# Patient Record
Sex: Male | Born: 1945
Health system: Southern US, Community
[De-identification: ages and names within clinical notes are randomized; demographics above are authoritative.]

## PROBLEM LIST (undated history)

## (undated) DIAGNOSIS — M25569 Pain in unspecified knee: Secondary | ICD-10-CM

## (undated) DIAGNOSIS — M25529 Pain in unspecified elbow: Secondary | ICD-10-CM

## (undated) DIAGNOSIS — E119 Type 2 diabetes mellitus without complications: Secondary | ICD-10-CM

## (undated) DIAGNOSIS — I10 Essential (primary) hypertension: Secondary | ICD-10-CM

## (undated) DIAGNOSIS — Z8249 Family history of ischemic heart disease and other diseases of the circulatory system: Secondary | ICD-10-CM

## (undated) DIAGNOSIS — C801 Malignant (primary) neoplasm, unspecified: Secondary | ICD-10-CM

## (undated) HISTORY — PX: JOINT REPLACEMENT: SHX530

## (undated) HISTORY — DX: Family history of ischemic heart disease and other diseases of the circulatory system: Z82.49

## (undated) HISTORY — PX: REPLACEMENT TOTAL HIP W/  RESURFACING IMPLANTS: SUR1222

## (undated) HISTORY — PX: SHOULDER SURGERY: SHX246

## (undated) HISTORY — DX: Essential (primary) hypertension: I10

## (undated) HISTORY — DX: Pain in unspecified elbow: M25.529

## (undated) HISTORY — DX: Type 2 diabetes mellitus without complications: E11.9

## (undated) HISTORY — DX: Pain in unspecified knee: M25.569

## (undated) HISTORY — PX: TOTAL KNEE ARTHROPLASTY: SHX125

## (undated) HISTORY — DX: Malignant (primary) neoplasm, unspecified: C80.1

## (undated) MED FILL — Dexamethasone Sodium Phosphate Inj 100 MG/10ML: INTRAMUSCULAR | Qty: 1 | Status: AC

---

## 2009-06-18 DIAGNOSIS — M25529 Pain in unspecified elbow: Secondary | ICD-10-CM

## 2009-06-18 DIAGNOSIS — R7301 Impaired fasting glucose: Secondary | ICD-10-CM | POA: Insufficient documentation

## 2009-06-18 HISTORY — DX: Pain in unspecified elbow: M25.529

## 2012-04-02 DIAGNOSIS — M25569 Pain in unspecified knee: Secondary | ICD-10-CM

## 2012-04-02 HISTORY — DX: Pain in unspecified knee: M25.569

## 2013-01-14 DIAGNOSIS — I4891 Unspecified atrial fibrillation: Secondary | ICD-10-CM | POA: Insufficient documentation

## 2013-01-14 DIAGNOSIS — Z7901 Long term (current) use of anticoagulants: Secondary | ICD-10-CM | POA: Insufficient documentation

## 2013-05-11 ENCOUNTER — Ambulatory Visit: Payer: Self-pay | Admitting: Physician Assistant

## 2013-05-29 HISTORY — PX: OTHER SURGICAL HISTORY: SHX169

## 2013-09-14 ENCOUNTER — Ambulatory Visit: Payer: Self-pay | Admitting: Emergency Medicine

## 2013-09-14 LAB — URINALYSIS, COMPLETE
Bilirubin,UR: NEGATIVE
Glucose,UR: NEGATIVE mg/dL (ref 0–75)
Ketone: NEGATIVE
NITRITE: NEGATIVE
PH: 8.5 (ref 4.5–8.0)
Protein: 100
Specific Gravity: 1.01 (ref 1.003–1.030)
Squamous Epithelial: NONE SEEN

## 2013-09-16 DIAGNOSIS — N390 Urinary tract infection, site not specified: Secondary | ICD-10-CM | POA: Insufficient documentation

## 2013-09-16 DIAGNOSIS — G8929 Other chronic pain: Secondary | ICD-10-CM | POA: Insufficient documentation

## 2013-09-16 DIAGNOSIS — M549 Dorsalgia, unspecified: Secondary | ICD-10-CM | POA: Insufficient documentation

## 2013-10-23 DIAGNOSIS — C678 Malignant neoplasm of overlapping sites of bladder: Secondary | ICD-10-CM | POA: Insufficient documentation

## 2013-10-23 DIAGNOSIS — C679 Malignant neoplasm of bladder, unspecified: Secondary | ICD-10-CM | POA: Insufficient documentation

## 2014-07-30 DIAGNOSIS — M6240 Contracture of muscle, unspecified site: Secondary | ICD-10-CM | POA: Insufficient documentation

## 2014-07-30 DIAGNOSIS — M62838 Other muscle spasm: Secondary | ICD-10-CM | POA: Insufficient documentation

## 2014-12-31 DIAGNOSIS — E7800 Pure hypercholesterolemia, unspecified: Secondary | ICD-10-CM | POA: Insufficient documentation

## 2014-12-31 DIAGNOSIS — I10 Essential (primary) hypertension: Secondary | ICD-10-CM | POA: Insufficient documentation

## 2015-05-18 ENCOUNTER — Encounter: Payer: Self-pay | Admitting: Pain Medicine

## 2015-05-18 ENCOUNTER — Ambulatory Visit: Payer: Medicare Other | Attending: Pain Medicine | Admitting: Pain Medicine

## 2015-05-18 VITALS — BP 137/53 | HR 53 | Temp 98.0°F | Resp 18 | Ht 68.0 in | Wt 216.0 lb

## 2015-05-18 DIAGNOSIS — M25512 Pain in left shoulder: Secondary | ICD-10-CM

## 2015-05-18 DIAGNOSIS — M25569 Pain in unspecified knee: Secondary | ICD-10-CM | POA: Insufficient documentation

## 2015-05-18 DIAGNOSIS — M25519 Pain in unspecified shoulder: Secondary | ICD-10-CM | POA: Insufficient documentation

## 2015-05-18 DIAGNOSIS — Z9229 Personal history of other drug therapy: Secondary | ICD-10-CM

## 2015-05-18 DIAGNOSIS — M1120 Other chondrocalcinosis, unspecified site: Secondary | ICD-10-CM | POA: Insufficient documentation

## 2015-05-18 DIAGNOSIS — I4891 Unspecified atrial fibrillation: Secondary | ICD-10-CM | POA: Insufficient documentation

## 2015-05-18 DIAGNOSIS — F1190 Opioid use, unspecified, uncomplicated: Secondary | ICD-10-CM | POA: Diagnosis not present

## 2015-05-18 DIAGNOSIS — G8929 Other chronic pain: Secondary | ICD-10-CM | POA: Insufficient documentation

## 2015-05-18 DIAGNOSIS — Z5181 Encounter for therapeutic drug level monitoring: Secondary | ICD-10-CM

## 2015-05-18 DIAGNOSIS — M542 Cervicalgia: Secondary | ICD-10-CM | POA: Diagnosis not present

## 2015-05-18 DIAGNOSIS — E669 Obesity, unspecified: Secondary | ICD-10-CM | POA: Insufficient documentation

## 2015-05-18 DIAGNOSIS — Z8551 Personal history of malignant neoplasm of bladder: Secondary | ICD-10-CM

## 2015-05-18 DIAGNOSIS — M791 Myalgia: Secondary | ICD-10-CM

## 2015-05-18 DIAGNOSIS — Z96653 Presence of artificial knee joint, bilateral: Secondary | ICD-10-CM | POA: Diagnosis not present

## 2015-05-18 DIAGNOSIS — M25511 Pain in right shoulder: Secondary | ICD-10-CM

## 2015-05-18 DIAGNOSIS — M62838 Other muscle spasm: Secondary | ICD-10-CM

## 2015-05-18 DIAGNOSIS — Z8781 Personal history of (healed) traumatic fracture: Secondary | ICD-10-CM

## 2015-05-18 DIAGNOSIS — M545 Low back pain: Secondary | ICD-10-CM | POA: Insufficient documentation

## 2015-05-18 DIAGNOSIS — Z9889 Other specified postprocedural states: Secondary | ICD-10-CM | POA: Insufficient documentation

## 2015-05-18 DIAGNOSIS — F17210 Nicotine dependence, cigarettes, uncomplicated: Secondary | ICD-10-CM | POA: Diagnosis not present

## 2015-05-18 DIAGNOSIS — Z79899 Other long term (current) drug therapy: Secondary | ICD-10-CM

## 2015-05-18 DIAGNOSIS — M25562 Pain in left knee: Secondary | ICD-10-CM

## 2015-05-18 DIAGNOSIS — E119 Type 2 diabetes mellitus without complications: Secondary | ICD-10-CM | POA: Insufficient documentation

## 2015-05-18 DIAGNOSIS — E7800 Pure hypercholesterolemia, unspecified: Secondary | ICD-10-CM | POA: Diagnosis not present

## 2015-05-18 DIAGNOSIS — Z7901 Long term (current) use of anticoagulants: Secondary | ICD-10-CM | POA: Diagnosis not present

## 2015-05-18 DIAGNOSIS — F1120 Opioid dependence, uncomplicated: Secondary | ICD-10-CM | POA: Insufficient documentation

## 2015-05-18 DIAGNOSIS — M549 Dorsalgia, unspecified: Secondary | ICD-10-CM | POA: Insufficient documentation

## 2015-05-18 DIAGNOSIS — Z79891 Long term (current) use of opiate analgesic: Secondary | ICD-10-CM | POA: Insufficient documentation

## 2015-05-18 DIAGNOSIS — I10 Essential (primary) hypertension: Secondary | ICD-10-CM | POA: Insufficient documentation

## 2015-05-18 DIAGNOSIS — M7918 Myalgia, other site: Secondary | ICD-10-CM | POA: Insufficient documentation

## 2015-05-18 DIAGNOSIS — M25559 Pain in unspecified hip: Secondary | ICD-10-CM | POA: Diagnosis not present

## 2015-05-18 DIAGNOSIS — Z96643 Presence of artificial hip joint, bilateral: Secondary | ICD-10-CM | POA: Diagnosis not present

## 2015-05-18 DIAGNOSIS — G893 Neoplasm related pain (acute) (chronic): Secondary | ICD-10-CM | POA: Insufficient documentation

## 2015-05-18 DIAGNOSIS — Z7189 Other specified counseling: Secondary | ICD-10-CM

## 2015-05-18 DIAGNOSIS — M25561 Pain in right knee: Secondary | ICD-10-CM

## 2015-05-18 DIAGNOSIS — M5412 Radiculopathy, cervical region: Secondary | ICD-10-CM

## 2015-05-18 MED ORDER — MORPHINE SULFATE ER 30 MG PO TBCR: 30.0000 mg | EXTENDED_RELEASE_TABLET | Freq: Two times a day (BID) | ORAL | Status: DC

## 2015-05-18 MED ORDER — OXYCODONE-ACETAMINOPHEN 10-325 MG PO TABS: 1.0000 | ORAL_TABLET | Freq: Three times a day (TID) | ORAL | Status: DC | PRN

## 2015-05-18 NOTE — Patient Instructions (Signed)
Labwork to be drawn in [facility] as discussed.

## 2015-05-18 NOTE — Progress Notes (Signed)
Safety precautions to be maintained throughout the outpatient stay will include: orient to surroundings, keep bed in low position, maintain call bell within reach at all times, provide assistance with transfer out of bed and ambulation.  Percocet  10/325 mg 27 tabs Morphine sulfate ER 30mg  tabs 30 tabs

## 2015-05-18 NOTE — Progress Notes (Signed)
Patient's Name: Aaron Short MRN: [id number] DOB: 11/25/1945 DOS: 05/18/2015  Primary Reason(s) for Visit: Encounter for Medication Management CC: Shoulder Pain; Hip Pain; Knee Pain; and Back Pain   HPI:    [name] is a 69 y.o. year old, male patient, who returns today as an established patient. He has Chronic pain; Long term current use of opiate analgesic; Long term prescription opiate use; Opiate use; Encounter for therapeutic drug level monitoring; Encounter for chronic pain management; Opioid dependence, daily use ([place]); Chronic hip pain (Bilateral) (Location of Primary Source of Pain) (R>L); Chronic shoulder pain (Bilateral) (L>R); Chronic low back pain (Bilateral) (R>L); Chronic knee pain (Bilateral) (R>L); Status post bilateral total hip replacement; Status post total bilateral knee replacement; Adiposity; Atrial fibrillation ([place]); Malignant neoplasm of urinary bladder ([place]); Type 2 diabetes mellitus ([place]); Essential (primary) hypertension; Muscle spasticity; Long term current use of anticoagulant; Combined fat and carbohydrate induced hyperlipemia; Arthralgia of lower leg; Arthralgia of upper arm; Pure hypercholesterolemia; History of bladder cancer; History of hip fracture; History of pelvic fracture; History of anticoagulant therapy; Chronic neck pain; Chondrocalcinosis; Radicular pain of shoulder; History of shoulder surgery; Myofascial pain; Musculoskeletal pain; and Muscle spasm on his problem list.. His primarily concern today is the Shoulder Pain; Hip Pain; Knee Pain; and Back Pain     The [facility] was [facility] seen at [facility] on [facility]2015 for medical management of his chronic pain. He returns today for follow-up.  Today's Pain Score: 2  Reported level of pain is incompatible with clinical obrservations. This may be secondary to a possible lack of understanding on how the pain scale works. Pain Type: Chronic pain Pain Location: Hip Pain Orientation: Right Pain Descriptors /  Indicators: Aching, Constant Pain Frequency: Constant  Last appointment: 01/19/2015 Purpose: Pharmacological management of the pain  Pharmacotherapy Review:   Side-effects or Adverse reactions: None reported Effectiveness: Described as relatively effective, allowing for increase in activities of daily living (ADL) Onset of action: Within expected pharmacological parameters Duration of action: Within normal limits for medication Peak effect: Timing and results are as within normal expected parameters [place] PMP: Compliant with practice rules and regulations UDS Results:   none available in the system. UDS Interpretation: No UDS available, at this time Medication Assessment Form: Reviewed. Patient indicates being compliant with therapy Treatment compliance: Compliant Substance Use Disorder (SUD) Risk Level: Low Pharmacologic Plan: Continue therapy as is  Lab Work: Inflammation Markers No results found for: ESRSEDRATE, CRP  Renal Function No results found for: BUN, CREATININE, GFRAA, GFRNONAA  Hepatic Function No results found for: AST, ALT, ALBUMIN  Electrolytes No results found for: NA, K, CL, CALCIUM, MG  Illicit Drugs No results found for: THCU, COCAINSCRNUR, PCPSCRNUR, MDMA, AMPHETMU, METHADONE, ETOH   Allergies:  [name] is allergic to penicillins.  Meds:  The patient has a current medication list which includes the following prescription(s): carisoprodol, digoxin, furosemide, losartan, metformin, metoprolol succinate, morphine, oxycodone-acetaminophen, simvastatin, warfarin, warfarin, morphine, morphine, oxycodone-acetaminophen, and oxycodone-acetaminophen. Requested Prescriptions   Signed Prescriptions Disp Refills  . morphine (MS CONTIN) 30 MG 12 hr tablet 60 tablet 0    Sig: Take 1 tablet (30 mg total) by mouth every 12 (twelve) hours.  Marland Kitchen oxyCODONE-acetaminophen (PERCOCET) 10-325 MG tablet 90 tablet 0    Sig: Take 1 tablet by mouth every 8 (eight) hours as  needed for pain.  Marland Kitchen morphine (MS CONTIN) 30 MG 12 hr tablet 60 tablet 0    Sig: Take 1 tablet (30 mg total) by mouth every 12 (  twelve) hours.  Marland Kitchen morphine (MS CONTIN) 30 MG 12 hr tablet 60 tablet 0    Sig: Take 1 tablet (30 mg total) by mouth every 12 (twelve) hours.  Marland Kitchen oxyCODONE-acetaminophen (PERCOCET) 10-325 MG tablet 90 tablet 0    Sig: Take 1 tablet by mouth every 8 (eight) hours as needed for pain.  Marland Kitchen oxyCODONE-acetaminophen (PERCOCET) 10-325 MG tablet 90 tablet 0    Sig: Take 1 tablet by mouth every 8 (eight) hours as needed for pain.    ROS:  Constitutional: Afebrile, no chills, well hydrated and well nourished Gastrointestinal: negative Musculoskeletal:negative Neurological: negative Behavioral/Psych: negative  PFSH:  Medical:  [name]  has a past medical history of Hypertension; Diabetes mellitus without complication ([place]); and Cancer ([place]). Family: family history includes Dementia in his mother; Heart disease in his father. Surgical:  has past surgical history that includes bladder cancer surgery (2015); Joint replacement; Replacement total hip w/  resurfacing implants (Bilateral); Total knee arthroplasty (Bilateral); and Shoulder surgery (Bilateral). Tobacco:  reports that he has been smoking Cigarettes.  He does not have any smokeless tobacco history on file. Alcohol:  reports that he does not drink alcohol. Drug:  reports that he does not use illicit drugs.  Physical Exam:  Vitals:  Today's Vitals   05/18/15 0932 05/18/15 0945  BP: 137/53   Pulse: 53   Temp: 98 F (36.7 C)   TempSrc: Oral   Resp: 18   Height: 5\' 8"  (1.727 m)   Weight: 216 lb (97.977 kg)   SpO2: 100%   PainSc: 2  2   PainLoc: Hip   Calculated BMI: Body mass index is 32.85 kg/(m^2). General appearance: alert, cooperative, appears stated age and no distress Eyes: PERLA Respiratory: No evidence respiratory distress, no audible rales or ronchi and no use of accessory muscles of  respiration Neck: no adenopathy, no carotid bruit, no JVD, supple, symmetrical, trachea midline and thyroid not enlarged, symmetric, no tenderness/mass/nodules  Cervical Spine ROM: Adequate for flexion, extension, rotation, and lateral bending Palpation: No palpable trigger points  Upper Extremities ROM: Adequate bilaterally Strength: 5/5 for all flexors and extensors of the upper extremity, bilaterally Pulses: Palpable bilaterally Neurologic: No allodynia, No hyperesthesia, No hyperpathia and No sensory abnormalities  Lumbar Spine ROM: Adequate for flexion, extension, rotation, and lateral bending Palpation: No palpable trigger points Lumbar Hyperextension and rotation: Non-contributory Patrick's Maneuver: Non-contributory  Lower Extremities ROM: Adequate bilaterally Strength: 5/5 for all flexors and extensors of the lower extremity, bilaterally Pulses: Palpable bilaterally Neurologic: No allodynia, No hyperesthesia, No hyperpathia, No sensory abnormalities and No antalgic gait or posture  Assessment:  Encounter Diagnosis:  Primary Diagnosis: Chronic pain [G89.29]  Plan:  Interventional Therapies: None at this time.  Aaron Short was seen today for shoulder pain, hip pain, knee pain and back pain.  Diagnoses and all orders for this visit:  Chronic pain -     COMPLETE METABOLIC PANEL WITH GFR; Future -     C-reactive protein; Future -     Magnesium; Future -     Sedimentation rate; Future -     Vitamin D2,D3 Panel; Future -     morphine (MS CONTIN) 30 MG 12 hr tablet; Take 1 tablet (30 mg total) by mouth every 12 (twelve) hours. -     oxyCODONE-acetaminophen (PERCOCET) 10-325 MG tablet; Take 1 tablet by mouth every 8 (eight) hours as needed for pain. -     morphine (MS CONTIN) 30 MG 12 hr tablet; Take 1 tablet (30  mg total) by mouth every 12 (twelve) hours. -     morphine (MS CONTIN) 30 MG 12 hr tablet; Take 1 tablet (30 mg total) by mouth every 12 (twelve) hours. -      oxyCODONE-acetaminophen (PERCOCET) 10-325 MG tablet; Take 1 tablet by mouth every 8 (eight) hours as needed for pain. -     oxyCODONE-acetaminophen (PERCOCET) 10-325 MG tablet; Take 1 tablet by mouth every 8 (eight) hours as needed for pain.  Long term current use of opiate analgesic -     Drugs of abuse screen w/o alc, rtn urine-sln  Long term prescription opiate use  Opiate use  Encounter for therapeutic drug level monitoring  Encounter for chronic pain management  Opioid dependence, daily use ([facility])  Chronic hip pain, unspecified laterality  Chronic shoulder pain (Bilateral) (L>R)  Chronic low back pain (Bilateral) (R>L)  Chronic knee pain (Bilateral) (R>L)  Status post bilateral total hip replacement  Status post total bilateral knee replacement  History of bladder cancer  History of hip fracture  History of pelvic fracture  History of anticoagulant therapy  Chronic neck pain  Chondrocalcinosis  Radicular pain of shoulder  History of shoulder surgery  Myofascial pain  Musculoskeletal pain  Muscle spasm     Patient Instructions  Labwork to be drawn in [facility] as discussed.   Medications discontinued today:  Medications Discontinued During This Encounter  Medication Reason  . morphine (MS CONTIN) 30 MG 12 hr tablet Reorder  . oxyCODONE-acetaminophen (PERCOCET) 10-325 MG tablet Reorder   Medications administered today:  [name] had no medications administered during this visit.  Primary Care Physician: No primary care provider on file. Location: [facility] Note by: Umar Patmon A. Dossie Arbour, M.D, DABA, DABAPM, DABPM, DABIPP, FIPP

## 2015-07-05 DIAGNOSIS — F1190 Opioid use, unspecified, uncomplicated: Secondary | ICD-10-CM | POA: Insufficient documentation

## 2015-07-05 DIAGNOSIS — F1120 Opioid dependence, uncomplicated: Secondary | ICD-10-CM | POA: Insufficient documentation

## 2015-08-16 ENCOUNTER — Encounter: Payer: Self-pay | Admitting: Pain Medicine

## 2015-08-16 ENCOUNTER — Ambulatory Visit: Payer: Medicare Other | Attending: Pain Medicine | Admitting: Pain Medicine

## 2015-08-16 VITALS — BP 129/57 | HR 48 | Temp 97.0°F | Resp 16 | Ht 68.0 in | Wt 230.0 lb

## 2015-08-16 DIAGNOSIS — M542 Cervicalgia: Secondary | ICD-10-CM | POA: Insufficient documentation

## 2015-08-16 DIAGNOSIS — I4891 Unspecified atrial fibrillation: Secondary | ICD-10-CM | POA: Diagnosis not present

## 2015-08-16 DIAGNOSIS — E119 Type 2 diabetes mellitus without complications: Secondary | ICD-10-CM | POA: Insufficient documentation

## 2015-08-16 DIAGNOSIS — Z5181 Encounter for therapeutic drug level monitoring: Secondary | ICD-10-CM | POA: Diagnosis not present

## 2015-08-16 DIAGNOSIS — Z96643 Presence of artificial hip joint, bilateral: Secondary | ICD-10-CM | POA: Diagnosis not present

## 2015-08-16 DIAGNOSIS — Z79891 Long term (current) use of opiate analgesic: Secondary | ICD-10-CM | POA: Insufficient documentation

## 2015-08-16 DIAGNOSIS — M25511 Pain in right shoulder: Secondary | ICD-10-CM | POA: Diagnosis not present

## 2015-08-16 DIAGNOSIS — I10 Essential (primary) hypertension: Secondary | ICD-10-CM | POA: Diagnosis not present

## 2015-08-16 DIAGNOSIS — M25551 Pain in right hip: Secondary | ICD-10-CM | POA: Insufficient documentation

## 2015-08-16 DIAGNOSIS — M25559 Pain in unspecified hip: Secondary | ICD-10-CM | POA: Diagnosis present

## 2015-08-16 DIAGNOSIS — M25562 Pain in left knee: Secondary | ICD-10-CM | POA: Diagnosis not present

## 2015-08-16 DIAGNOSIS — M25561 Pain in right knee: Secondary | ICD-10-CM | POA: Insufficient documentation

## 2015-08-16 DIAGNOSIS — M545 Low back pain: Secondary | ICD-10-CM | POA: Insufficient documentation

## 2015-08-16 DIAGNOSIS — E7800 Pure hypercholesterolemia, unspecified: Secondary | ICD-10-CM | POA: Diagnosis not present

## 2015-08-16 DIAGNOSIS — M62838 Other muscle spasm: Secondary | ICD-10-CM | POA: Diagnosis not present

## 2015-08-16 DIAGNOSIS — G8929 Other chronic pain: Secondary | ICD-10-CM | POA: Insufficient documentation

## 2015-08-16 DIAGNOSIS — Z8551 Personal history of malignant neoplasm of bladder: Secondary | ICD-10-CM | POA: Insufficient documentation

## 2015-08-16 DIAGNOSIS — M25512 Pain in left shoulder: Secondary | ICD-10-CM | POA: Insufficient documentation

## 2015-08-16 DIAGNOSIS — Z96653 Presence of artificial knee joint, bilateral: Secondary | ICD-10-CM | POA: Insufficient documentation

## 2015-08-16 DIAGNOSIS — F1190 Opioid use, unspecified, uncomplicated: Secondary | ICD-10-CM

## 2015-08-16 DIAGNOSIS — M25552 Pain in left hip: Secondary | ICD-10-CM | POA: Diagnosis not present

## 2015-08-16 DIAGNOSIS — M25519 Pain in unspecified shoulder: Secondary | ICD-10-CM | POA: Diagnosis present

## 2015-08-16 DIAGNOSIS — F17210 Nicotine dependence, cigarettes, uncomplicated: Secondary | ICD-10-CM | POA: Diagnosis not present

## 2015-08-16 DIAGNOSIS — M47816 Spondylosis without myelopathy or radiculopathy, lumbar region: Secondary | ICD-10-CM | POA: Insufficient documentation

## 2015-08-16 DIAGNOSIS — M791 Myalgia: Secondary | ICD-10-CM | POA: Insufficient documentation

## 2015-08-16 DIAGNOSIS — E7801 Familial hypercholesterolemia: Secondary | ICD-10-CM | POA: Diagnosis not present

## 2015-08-16 MED ORDER — MORPHINE SULFATE ER 30 MG PO TBCR: 30.0000 mg | EXTENDED_RELEASE_TABLET | Freq: Two times a day (BID) | ORAL | Status: DC

## 2015-08-16 MED ORDER — NALOXONE HCL 4 MG/0.1ML NA LIQD
1.0000 | Freq: Once | NASAL | Status: DC
Start: 2015-08-16 — End: 2018-12-31

## 2015-08-16 MED ORDER — OXYCODONE-ACETAMINOPHEN 10-325 MG PO TABS: 1.0000 | ORAL_TABLET | Freq: Three times a day (TID) | ORAL | Status: DC | PRN

## 2015-08-16 NOTE — Progress Notes (Signed)
Safety precautions to be maintained throughout the outpatient stay will include: orient to surroundings, keep bed in low position, maintain call bell within reach at all times, provide assistance with transfer out of bed and ambulation. MS contin pill count # 12/60  Filled 07/26/15 Oxycodone #17/90  Filled 07/26/15

## 2015-08-16 NOTE — Progress Notes (Signed)
Patient's Name: Aaron Short MRN: [id number] DOB: Jun 20, 1945 DOS: 08/16/2015  Primary Reason(s) for Visit: Encounter for Medication Management CC: Shoulder Pain and Hip Pain   HPI  Aaron Short is a 70 y.o. year old, male patient, who returns today as an established patient. He has Chronic pain; Long term current use of opiate analgesic; Long term prescription opiate use; Opiate use (105 MME/Day); Encounter for therapeutic drug level monitoring; Encounter for chronic pain management; Opioid dependence, daily use ([place]); Chronic hip pain (Location of Secondary source of pain) (Bilateral) (R>L); Chronic shoulder pain (Location of Primary Source of Pain) (Bilateral) (L>R); Chronic low back pain (Location of Tertiary source of pain) (Bilateral) (R>L); Chronic knee pain (Bilateral) (R>L); S/P THR: total hip replacement (Bilateral); S/P TKR: total knee replacement (Bilateral); Adiposity; Atrial fibrillation ([place]); Malignant neoplasm of urinary bladder ([place]); Type 2 diabetes mellitus ([place]); Essential (primary) hypertension; Muscle spasticity; Long term current use of anticoagulant; Combined fat and carbohydrate induced hyperlipemia; Pure hypercholesterolemia; History of bladder cancer; History of hip fracture; History of pelvic fracture; History of anticoagulant therapy; Chronic neck pain; Chondrocalcinosis; Radicular pain of shoulder; History of shoulder surgery; Myofascial pain; Musculoskeletal pain; Muscle spasm; Lumbar facet syndrome (Bilateral) (R>L); Lumbar spondylosis; Morbid obesity ([place]); and Continuous opioid dependence ([place]) on his problem list.. His primarily concern today is the Shoulder Pain and Hip Pain   The patient returns to the clinics today for pharmacological management of his chronic pain. He indicates today that his primary pain is in the area of the left shoulder followed by pain in the area of the right hip. He indicated the he went to the emergency room and they gave him a shot  into the hip with some steroids at a [facility] walking clinic. He was a little surprised because he thought that that could not be done on a hip that has been replaced. In addition, they did not stop his Coumadin. Today we have evaluated the results of this injection and he indicates that he had some short-term benefit, but not significant and not long lasting. During the evolution of today's pain it turns out that what he seems to be having his bilateral lumbar facet syndrome with radiation of pain down both legs. Ace of the right lower extremity happens to go over the area where he had his surgery done and that makes it somewhat confusing. However, hyperextension and rotation today did reproduce the patient's pain indicating that likely to be coming from the facet joints. Today we will go ahead and do an x-ray of the area and we will schedule him to come back for a diagnostic lumbar facet block.  Because the patient is at 70 MME/Day, today we have sent to his pharmacy a prescription for Narcan as indicated by the CDC guidelines. In addition we have talked to him about the possibility of titrating him off of the narcotics. Today we have kept the dose the same but we have informed him that we will strut this process on his next visit.  Pain Assessment: Self-Reported Pain Score: 1  Reported level is compatible with observation Pain Type: Chronic pain Pain Location: Shoulder (hip) Pain Orientation:  (bilateral shouilders, lwft worse than right, right hip) Pain Descriptors / Indicators: Sharp Pain Frequency: Intermittent  Date of Last Visit: 05/18/15 Service Provided on Last Visit: Med Refill  Controlled Substance Pharmacotherapy Assessment  Analgesic: MS Contin 30 mg every 12 hours (60 mg/day) plus oxycodone/APAP 10/325 every 8 hours (30 mg/day) MME/day: 105 mg/day Pharmacokinetics: Onset of  action (Liberation/Absorption): Within expected pharmacological parameters Time to Peak effect (Distribution):  Timing and results are as within normal expected parameters Duration of action (Metabolism/Excretion): Within normal limits for medication Pharmacodynamics: Analgesic Effect: More than 50% Activity Facilitation: Medication(s) allow patient to sit, stand, walk, and do the basic ADLs Perceived Effectiveness: Described as relatively effective, allowing for increase in activities of daily living (ADL) Side-effects or Adverse reactions: None reported Monitoring: [place] PMP: Compliant with practice rules and regulations UDS Results/interpretation: None available at this time. Apparently his last sample leaked on transit and cannot be used. Medication Assessment Form: Reviewed. Patient indicates being compliant with therapy Treatment compliance: Compliant Risk Assessment: Aberrant Behavior: None observed today Substance Use Disorder (SUD) Risk Level: Low Opioid Risk Tool (ORT) Score: Total Score: 0 Low Risk for SUD (Score <3) Depression Scale Score: PHQ-2: PHQ-2 Total Score: 0 No depression (0) PHQ-9: PHQ-9 Total Score: 0 No depression (0-4)  Pharmacologic Plan: No change in therapy, at this time   Laboratory Workup  Last ED UDS: No results found for: THCU, COCAINSCRNUR, PCPSCRNUR, MDMA, AMPHETMU, METHADONE, ETOH  Inflammation Markers No results found for: ESRSEDRATE, CRP  Renal Function No results found for: BUN, CREATININE, GFRAA, GFRNONAA  Hepatic Function No results found for: AST, ALT, ALBUMIN  Electrolytes No results found for: NA, K, CL, CALCIUM, MG   Allergies  Aaron Short is allergic to diltiazem; tizanidine; and penicillins.  Meds  The patient has a current medication list which includes the following prescription(s): fifty50 glucose meter 2.0, carisoprodol, digoxin, gavilyte-g, losartan, metformin, metoprolol succinate, morphine, morphine, morphine, oxycodone-acetaminophen, oxycodone-acetaminophen, oxycodone-acetaminophen, simvastatin, warfarin, furosemide, and  naloxone hcl.  Current Outpatient Prescriptions on File Prior to Visit  Medication Sig  . carisoprodol (SOMA) 350 MG tablet Take 350 mg by mouth 2 (two) times daily.   . digoxin (LANOXIN) 0.125 MG tablet Take 0.125 mg by mouth at bedtime.   Marland Kitchen losartan (COZAAR) 50 MG tablet Take 50 mg by mouth daily.   . metFORMIN (GLUCOPHAGE) 500 MG tablet Take 500 mg by mouth daily with breakfast.   . simvastatin (ZOCOR) 40 MG tablet Take 40 mg by mouth at bedtime.   Marland Kitchen warfarin (COUMADIN) 4 MG tablet Take 4 mg by mouth daily at 6 PM.   . furosemide (LASIX) 20 MG tablet Take 20 mg by mouth as needed.    No current facility-administered medications on file prior to visit.    ROS  Constitutional: Afebrile, no chills, well hydrated and well nourished Gastrointestinal: negative Musculoskeletal:negative Neurological: negative Behavioral/Psych: negative  PFSH  Medical:  Aaron Short  has a past medical history of Hypertension; Diabetes mellitus without complication ([place]); Cancer ([place]); Arthralgia of lower leg (04/02/2012); and Arthralgia of upper arm (06/18/2009). Family: family history includes Dementia in his mother; Heart disease in his father. Surgical:  has past surgical history that includes bladder cancer surgery (2015); Joint replacement; Replacement total hip w/  resurfacing implants (Bilateral); Total knee arthroplasty (Bilateral); and Shoulder surgery (Bilateral). Tobacco:  reports that he has been smoking Cigarettes.  He does not have any smokeless tobacco history on file. Alcohol:  reports that he does not drink alcohol. Drug:  reports that he does not use illicit drugs.  Physical Exam  Vitals:  Today's Vitals   08/16/15 0952 08/16/15 0953  BP:  129/57  Pulse:  48  Temp:  97 F (36.1 C)  Resp:  16  Height: 5\' 8"  (1.727 m)   Weight: 230 lb (104.327 kg)   SpO2:  97%  PainSc: 1  1   PainLoc: Shoulder     Calculated BMI: Body mass index is 34.98 kg/(m^2).     General appearance:  alert, cooperative, appears stated age and no distress Eyes: PERLA Respiratory: No evidence respiratory distress, no audible rales or ronchi and no use of accessory muscles of respiration  Cervical Spine Inspection: Normal anatomy Alignment: Symetrical ROM: Decreased  Upper Extremities Inspection: No gross anomalies detected ROM: Decreased for both shoulders Sensory: Normal Motor: Unremarkable  Lumbar Spine Exam  Inspection: No gross anomalies detected Alignment: Symetrical Palpation: Painful ROM:  Flexion: Decreased ROM Extension: Decreased ROM Lateral Bending: Decreased ROM Rotation: Decreased ROM Provocative Tests:  Lumbar Hyperextension and rotation test:  Positive for facet pain, bilaterally Patrick's Maneuver: deferred  Gait Evaluation  Gait: Antalgic (limping)  Lower Extremity Exam  Inspection: No gross anomalies detected ROM: Adequate Sensory:  Normal Motor: Unremarkable  Toe walk (S1): WNL  Heal walk (L5): WNL  Assessment & Plan  Primary Diagnosis & Pertinent Problem List: The primary encounter diagnosis was Chronic low back pain (Bilateral) (R>L). Diagnoses of Chronic pain, Encounter for therapeutic drug level monitoring, Long term current use of opiate analgesic, Opiate use (105 MME/Day), Lumbar facet syndrome (Bilateral) (R>L), and Lumbar spondylosis, unspecified spinal osteoarthritis were also pertinent to this visit.  Visit Diagnosis: 1. Chronic low back pain (Bilateral) (R>L)   2. Chronic pain   3. Encounter for therapeutic drug level monitoring   4. Long term current use of opiate analgesic   5. Opiate use (105 MME/Day)   6. Lumbar facet syndrome (Bilateral) (R>L)   7. Lumbar spondylosis, unspecified spinal osteoarthritis     Problem-specific Plan(s): No problem-specific assessment & plan notes found for this encounter.   Plan of Care  Pharmacotherapy (Medications Ordered): Meds ordered this encounter  Medications  . morphine (MS CONTIN) 30  MG 12 hr tablet    Sig: Take 1 tablet (30 mg total) by mouth every 12 (twelve) hours.    Dispense:  60 tablet    Refill:  0    Do not place this medication, or any other prescription from our practice, on "Automatic Refill". Patient may have prescription filled one day early if pharmacy is closed on scheduled refill date. Do not fill until: 08/22/15 To last until: 09/21/15  . morphine (MS CONTIN) 30 MG 12 hr tablet    Sig: Take 1 tablet (30 mg total) by mouth every 12 (twelve) hours.    Dispense:  60 tablet    Refill:  0    Do not place this medication, or any other prescription from our practice, on "Automatic Refill". Patient may have prescription filled one day early if pharmacy is closed on scheduled refill date. Do not fill until: 09/21/15 To last until: 10/21/15  . morphine (MS CONTIN) 30 MG 12 hr tablet    Sig: Take 1 tablet (30 mg total) by mouth every 12 (twelve) hours.    Dispense:  60 tablet    Refill:  0    Do not place this medication, or any other prescription from our practice, on "Automatic Refill". Patient may have prescription filled one day early if pharmacy is closed on scheduled refill date. Do not fill until: 10/21/15 To last until: 11/20/15  . oxyCODONE-acetaminophen (PERCOCET) 10-325 MG tablet    Sig: Take 1 tablet by mouth every 8 (eight) hours as needed for pain.    Dispense:  90 tablet    Refill:  0    Do not place  this medication, or any other prescription from our practice, on "Automatic Refill". Patient may have prescription filled one day early if pharmacy is closed on scheduled refill date. Do not fill until: 08/22/15 To last until: 09/21/15  . oxyCODONE-acetaminophen (PERCOCET) 10-325 MG tablet    Sig: Take 1 tablet by mouth every 8 (eight) hours as needed for pain.    Dispense:  90 tablet    Refill:  0    Do not place this medication, or any other prescription from our practice, on "Automatic Refill". Patient may have prescription filled one day early  if pharmacy is closed on scheduled refill date. Do not fill until: 09/21/15 To last until: 10/21/15  . oxyCODONE-acetaminophen (PERCOCET) 10-325 MG tablet    Sig: Take 1 tablet by mouth every 8 (eight) hours as needed for pain.    Dispense:  90 tablet    Refill:  0    Do not place this medication, or any other prescription from our practice, on "Automatic Refill". Patient may have prescription filled one day early if pharmacy is closed on scheduled refill date. Do not fill until: 10/21/15 To last until: 11/20/15  . Naloxone HCl (NARCAN) 4 MG/0.1ML LIQD    Sig: Place 1 spray into the nose once. Spray half of bottle content into each nostril, then call 911    Dispense:  2 each    Refill:  0    Please instruct patient in the proper use of the medication.    Lab-work & Procedure Ordered: Orders Placed This Encounter  Procedures  . LUMBAR FACET(MEDIAL BRANCH NERVE BLOCK) MBNB    Standing Status: Future     Number of Occurrences:      Standing Expiration Date: 08/15/2016    Scheduling Instructions:     Side: Bilateral     Level: L2, L3, L4, L5, & S1 Medial Branch Nerve     Sedation: With Sedation.     Timeframe: Stop the Coumadin for 5 days prior to procedure. Get the approval from his cardiologist before doing so.    Order Specific Question:  Where will this procedure be performed?    Answer:  [facility]  . DG Lumbar Spine Complete W/Bend    Standing Status: Future     Number of Occurrences:      Standing Expiration Date: 08/15/2016    Scheduling Instructions:     Please include flexion and extension views and report any spinal instability (>4 mm displacement of any spondylolisthesis). If present, please report any spondylolisthesis grade, as well as displacement in millimeters.    Order Specific Question:  Reason for Exam (SYMPTOM  OR DIAGNOSIS REQUIRED)    Answer:  Low back pain    Order Specific Question:  Preferred imaging location?    Answer:  [facility]     Order Specific Question:  Call Results- Best Contact Number?    Answer:  [phone] (Pain Clinic facility) (Dr. Dossie Arbour)  . ToxASSURE Select 13 (MW), Urine    Volume: 30 ml(s). Minimum 3 ml of urine is needed. Document temperature of fresh sample. Indications: Long term (current) use of opiate analgesic (Z79.891)    Imaging Ordered: DG LUMBAR SPINE COMPLETE W/BEND 6+V  Interventional Therapies: Scheduled: Diagnostic bilateral lumbar facet block under fluoroscopic guidance and IV sedation. PRN Procedures:  1. Possible lumbar facet radiofrequency.  2. Possible left intra-articular shoulder injection.  3. Possible right hip block for possible radiofrequency.    Referral(s) or Consult(s): None at this time.  Medications administered during this visit: Aaron Short had no medications administered during this visit.  Future Appointments Date Time Provider [place]  11/15/2015 9:00 AM Milinda Pointer, MD [facility] None    Primary Care Physician: No primary care provider on file. Location: [facility] Note by: Rosealynn Mateus A. Dossie Arbour, M.D, DABA, DABAPM, DABPM, DABIPP, FIPP  Pain Score Disclaimer: We use the NRS-11 scale. This is a self-reported, subjective measurement of pain severity with only modest accuracy. It is used primarily to identify changes within a particular patient. It must be understood that outpatient pain scales are significantly less accurate that those used for research, where they can be applied under ideal controlled circumstances with minimal exposure to variables. In reality, the score is likely to be a combination of pain intensity and pain affect, where pain affect describes the degree of emotional arousal or changes in action readiness caused by the sensory experience of pain. Factors such as social and work situation, setting, emotional state, anxiety levels, expectation, and prior pain experience may influence pain  perception and show large inter-individual differences that may also be affected by time variables.

## 2015-08-16 NOTE — Patient Instructions (Addendum)
Smoking Cessation, Tips for Success If you are ready to quit smoking, congratulations! You have chosen to help yourself be healthier. Cigarettes bring nicotine, tar, carbon monoxide, and other irritants into your body. Your lungs, heart, and blood vessels will be able to work better without these poisons. There are many different ways to quit smoking. Nicotine gum, nicotine patches, a nicotine inhaler, or nicotine nasal spray can help with physical craving. Hypnosis, support groups, and medicines help break the habit of smoking. WHAT THINGS CAN I DO TO MAKE QUITTING EASIER?  Here are some tips to help you quit for good: 1. Pick a date when you will quit smoking completely. Tell all of your friends and family about your plan to quit on that date. 2. Do not try to slowly cut down on the number of cigarettes you are smoking. Pick a quit date and quit smoking completely starting on that day. 3. Throw away all cigarettes.  4. Clean and remove all ashtrays from your home, work, and car. 5. On a card, write down your reasons for quitting. Carry the card with you and read it when you get the urge to smoke. 6. Cleanse your body of nicotine. Drink enough water and fluids to keep your urine clear or pale yellow. Do this after quitting to flush the nicotine from your body. 7. Learn to predict your moods. Do not let a bad situation be your excuse to have a cigarette. Some situations in your life might tempt you into wanting a cigarette. 8. Never have "just one" cigarette. It leads to wanting another and another. Remind yourself of your decision to quit. 9. Change habits associated with smoking. If you smoked while driving or when feeling stressed, try other activities to replace smoking. Stand up when drinking your coffee. Brush your teeth after eating. Sit in a different chair when you read the paper. Avoid alcohol while trying to quit, and try to drink fewer caffeinated beverages. Alcohol and caffeine may urge you  to smoke. 10. Avoid foods and drinks that can trigger a desire to smoke, such as sugary or spicy foods and alcohol. 11. Ask people who smoke not to smoke around you. 12. Have something planned to do right after eating or having a cup of coffee. For example, plan to take a walk or exercise. 13. Try a relaxation exercise to calm you down and decrease your stress. Remember, you may be tense and nervous for the first 2 weeks after you quit, but this will pass. 14. Find new activities to keep your hands busy. Play with a pen, coin, or rubber band. Doodle or draw things on paper. 15. Brush your teeth right after eating. This will help cut down on the craving for the taste of tobacco after meals. You can also try mouthwash.  16. Use oral substitutes in place of cigarettes. Try using lemon drops, carrots, cinnamon sticks, or chewing gum. Keep them handy so they are available when you have the urge to smoke. 17. When you have the urge to smoke, try deep breathing. 18. Designate your home as a nonsmoking area. 19. If you are a heavy smoker, ask your health care provider about a prescription for nicotine chewing gum. It can ease your withdrawal from nicotine. 20. Reward yourself. Set aside the cigarette money you save and buy yourself something nice. 21. Look for support from others. Join a support group or smoking cessation program. Ask someone at home or at work to help you with your plan   to quit smoking. 22. Always ask yourself, "Do I need this cigarette or is this just a reflex?" Tell yourself, "Today, I choose not to smoke," or "I do not want to smoke." You are reminding yourself of your decision to quit. 23. Do not replace cigarette smoking with electronic cigarettes (commonly called e-cigarettes). The safety of e-cigarettes is unknown, and some may contain harmful chemicals. 24. If you relapse, do not give up! Plan ahead and think about what you will do the next time you get the urge to smoke. HOW WILL  I FEEL WHEN I QUIT SMOKING? You may have symptoms of withdrawal because your body is used to nicotine (the addictive substance in cigarettes). You may crave cigarettes, be irritable, feel very hungry, cough often, get headaches, or have difficulty concentrating. The withdrawal symptoms are only temporary. They are strongest when you first quit but will go away within 10-14 days. When withdrawal symptoms occur, stay in control. Think about your reasons for quitting. Remind yourself that these are signs that your body is healing and getting used to being without cigarettes. Remember that withdrawal symptoms are easier to treat than the major diseases that smoking can cause.  Even after the withdrawal is over, expect periodic urges to smoke. However, these cravings are generally short lived and will go away whether you smoke or not. Do not smoke! WHAT RESOURCES ARE AVAILABLE TO HELP ME QUIT SMOKING? Your health care provider can direct you to community resources or hospitals for support, which may include:  Group support.  Education.  Hypnosis.  Therapy.   This information is not intended to replace advice given to you by your health care provider. Make sure you discuss any questions you have with your health care provider.   Document Released: 02/11/2004 Document Revised: 06/05/2014 Document Reviewed: 10/31/2012 Else[employer] Interactive Patient Education 2016 [employer] GENERAL RISKS AND COMPLICATIONS  What are the risk, side effects and possible complications? Generally speaking, most procedures are safe.  However, with any procedure there are risks, side effects, and the possibility of complications.  The risks and complications are dependent upon the sites that are lesioned, or the type of nerve block to be performed.  The closer the procedure is to the spine, the more serious the risks are.  Great care is taken when placing the radio frequency needles, block needles or lesioning probes, but  sometimes complications can occur. 1. Infection: Any time there is an injection through the skin, there is a risk of infection.  This is why sterile conditions are used for these blocks.  There are four possible types of infection. 1. Localized skin infection. 2. Central Nervous System Infection-This can be in the form of Meningitis, which can be deadly. 3. Epidural Infections-This can be in the form of an epidural abscess, which can cause pressure inside of the spine, causing compression of the spinal cord with subsequent paralysis. This would require an emergency surgery to decompress, and there are no guarantees that the patient would recover from the paralysis. 4. Discitis-This is an infection of the intervertebral discs.  It occurs in about 1% of discography procedures.  It is difficult to treat and it may lead to surgery.        2. Pain: the needles have to go through skin and soft tissues, will cause soreness.       3. Damage to internal structures:  The nerves to be lesioned may be near blood vessels or    other nerves which can   be potentially damaged.       4. Bleeding: Bleeding is more common if the patient is taking blood thinners such as  aspirin, Coumadin, Ticiid, Plavix, etc., or if he/she have some genetic predisposition  such as hemophilia. Bleeding into the spinal canal can cause compression of the spinal  cord with subsequent paralysis.  This would require an emergency surgery to  decompress and there are no guarantees that the patient would recover from the  paralysis.       5. Pneumothorax:  Puncturing of a lung is a possibility, every time a needle is introduced in  the area of the chest or upper back.  Pneumothorax refers to free air around the  collapsed lung(s), inside of the thoracic cavity (chest cavity).  Another two possible  complications related to a similar event would include: Hemothorax and Chylothorax.   These are variations of the Pneumothorax, where instead of air  around the collapsed  lung(s), you may have blood or chyle, respectively.       6. Spinal headaches: They may occur with any procedures in the area of the spine.       7. Persistent CSF (Cerebro-Spinal Fluid) leakage: This is a rare problem, but may occur  with prolonged intrathecal or epidural catheters either due to the formation of a fistulous  track or a dural tear.       8. Nerve damage: By working so close to the spinal cord, there is always a possibility of  nerve damage, which could be as serious as a permanent spinal cord injury with  paralysis.       9. Death:  Although rare, severe deadly allergic reactions known as "Anaphylactic  reaction" can occur to any of the medications used.      10. Worsening of the symptoms:  We can always make thing worse.  What are the chances of something like this happening? Chances of any of this occuring are extremely low.  By statistics, you have more of a chance of getting killed in a motor vehicle accident: while driving to the hospital than any of the above occurring .  Nevertheless, you should be aware that they are possibilities.  In general, it is similar to taking a shower.  Everybody knows that you can slip, hit your head and get killed.  Does that mean that you should not shower again?  Nevertheless always keep in mind that statistics do not mean anything if you happen to be on the wrong side of them.  Even if a procedure has a 1 (one) in a 1,000,000 (million) chance of going wrong, it you happen to be that one..Also, keep in mind that by statistics, you have more of a chance of having something go wrong when taking medications.  Who should not have this procedure? If you are on a blood thinning medication (e.g. Coumadin, Plavix, see list of "Blood Thinners"), or if you have an active infection going on, you should not have the procedure.  If you are taking any blood thinners, please inform your physician.  How should I prepare for this  procedure?  Do not eat or drink anything at least six hours prior to the procedure.  Bring a driver with you .  It cannot be a taxi.  Come accompanied by an adult that can drive you back, and that is strong enough to help you if your legs get weak or numb from the local anesthetic.  Take all of your medicines   the morning of the procedure with just enough water to swallow them.  If you have diabetes, make sure that you are scheduled to have your procedure done first thing in the morning, whenever possible.  If you have diabetes, take only half of your insulin dose and notify our nurse that you have done so as soon as you arrive at the clinic.  If you are diabetic, but only take blood sugar pills (oral hypoglycemic), then do not take them on the morning of your procedure.  You may take them after you have had the procedure.  Do not take aspirin or any aspirin-containing medications, at least eleven (11) days prior to the procedure.  They may prolong bleeding.  Wear loose fitting clothing that may be easy to take off and that you would not mind if it got stained with Betadine or blood.  Do not wear any jewelry or perfume  Remove any nail coloring.  It will interfere with some of our monitoring equipment.  NOTE: Remember that this is not meant to be interpreted as a complete list of all possible complications.  Unforeseen problems may occur.  BLOOD THINNERS The following drugs contain aspirin or other products, which can cause increased bleeding during surgery and should not be taken for 2 weeks prior to and 1 week after surgery.  If you should need take something for relief of minor pain, you may take acetaminophen which is found in Tylenol,m Datril, Anacin-3 and Panadol. It is not blood thinner. The products listed below are.  Do not take any of the products listed below in addition to any listed on your instruction sheet.  A.P.C or A.P.C with Codeine Codeine Phosphate Capsules #3  Ibuprofen Ridaura  ABC compound Congesprin Imuran rimadil  Advil Cope Indocin Robaxisal  Alka-Seltzer Effervescent Pain Reliever and Antacid Coricidin or Coricidin-D  Indomethacin Rufen  Alka-Seltzer plus Cold Medicine Cosprin Ketoprofen S-A-C Tablets  Anacin Analgesic Tablets or Capsules Coumadin Korlgesic Salflex  Anacin Extra Strength Analgesic tablets or capsules CP-2 Tablets Lanoril Salicylate  Anaprox Cuprimine Capsules Levenox Salocol  Anexsia-D Dalteparin Magan Salsalate  Anodynos Darvon compound Magnesium Salicylate Sine-off  Ansaid Dasin Capsules Magsal Sodium Salicylate  Anturane Depen Capsules Marnal Soma  APF Arthritis pain formula Dewitt's Pills Measurin Stanback  Argesic Dia-Gesic Meclofenamic Sulfinpyrazone  Arthritis Bayer Timed Release Aspirin Diclofenac Meclomen Sulindac  Arthritis pain formula Anacin Dicumarol Medipren Supac  Analgesic (Safety coated) Arthralgen Diffunasal Mefanamic Suprofen  Arthritis Strength Bufferin Dihydrocodeine Mepro Compound Suprol  Arthropan liquid Dopirydamole Methcarbomol with Aspirin Synalgos  ASA tablets/Enseals Disalcid Micrainin Tagament  Ascriptin Doan's Midol Talwin  Ascriptin A/D Dolene Mobidin Tanderil  Ascriptin Extra Strength Dolobid Moblgesic Ticlid  Ascriptin with Codeine Doloprin or Doloprin with Codeine Momentum Tolectin  Asperbuf Duoprin Mono-gesic Trendar  Aspergum Duradyne Motrin or Motrin IB Triminicin  Aspirin plain, buffered or enteric coated Durasal Myochrisine Trigesic  Aspirin Suppositories Easprin Nalfon Trillsate  Aspirin with Codeine Ecotrin Regular or Extra Strength Naprosyn Uracel  Atromid-S Efficin Naproxen Ursinus  Auranofin Capsules Elmiron Neocylate Vanquish  Axotal Emagrin Norgesic Verin  Azathioprine Empirin or Empirin with Codeine Normiflo Vitamin E  Azolid Emprazil Nuprin Voltaren  Bayer Aspirin plain, buffered or children's or timed BC Tablets or powders Encaprin Orgaran Warfarin Sodium   Buff-a-Comp Enoxaparin Orudis Zorpin  Buff-a-Comp with Codeine Equegesic Os-Cal-Gesic   Buffaprin Excedrin plain, buffered or Extra Strength Oxalid   Bufferin Arthritis Strength Feldene Oxphenbutazone   Bufferin plain or Extra Strength Feldene Capsules Oxycodone with Aspirin     Bufferin with Codeine Fenoprofen Fenoprofen Pabalate or Pabalate-SF   Buffets II Flogesic Panagesic   Buffinol plain or Extra Strength Florinal or Florinal with Codeine Panwarfarin   Buf-Tabs Flurbiprofen Penicillamine   Butalbital Compound Four-way cold tablets Penicillin   Butazolidin Fragmin Pepto-Bismol   Carbenicillin Geminisyn Percodan   Carna Arthritis Reliever Geopen Persantine   Carprofen Gold's salt Persistin   Chloramphenicol Goody's Phenylbutazone   Chloromycetin Haltrain Piroxlcam   Clmetidine heparin Plaquenil   Cllnoril Hyco-pap Ponstel   Clofibrate Hydroxy chloroquine Propoxyphen         Before stopping any of these medications, be sure to consult the physician who ordered them.  Some, such as Coumadin (Warfarin) are ordered to prevent or treat serious conditions such as "deep thrombosis", "pumonary embolisms", and other heart problems.  The amount of time that you may need off of the medication may also vary with the medication and the reason for which you were taking it.  If you are taking any of these medications, please make sure you notify your pain physician before you undergo any procedures.         Facet Blocks Patient Information  Description: The facets are joints in the spine between the vertebrae.  Like any joints in the body, facets can become irritated and painful.  Arthritis can also effect the facets.  By injecting steroids and local anesthetic in and around these joints, we can temporarily block the nerve supply to them.  Steroids act directly on irritated nerves and tissues to reduce selling and inflammation which often leads to decreased pain.  Facet blocks may be done  anywhere along the spine from the neck to the low back depending upon the location of your pain.   After numbing the skin with local anesthetic (like Novocaine), a small needle is passed onto the facet joints under x-ray guidance.  You may experience a sensation of pressure while this is being done.  The entire block usually lasts about 15-25 minutes.   Conditions which may be treated by facet blocks:   Low back/buttock pain  Neck/shoulder pain  Certain types of headaches  Preparation for the injection:  1. Do not eat any solid food or dairy products within 8 hours of your appointment. 2. You may drink clear liquid up to 3 hours before appointment.  Clear liquids include water, black coffee, juice or soda.  No milk or cream please. 3. You may take your regular medication, including pain medications, with a sip of water before your appointment.  Diabetics should hold regular insulin (if taken separately) and take 1/2 normal NPH dose the morning of the procedure.  Carry some sugar containing items with you to your appointment. 4. A driver must accompany you and be prepared to drive you home after your procedure. 5. Bring all your current medications with you. 6. An IV may be inserted and sedation may be given at the discretion of the physician. 7. A blood pressure cuff, EKG and other monitors will often be applied during the procedure.  Some patients may need to have extra oxygen administered for a short period. 8. You will be asked to provide medical information, including your allergies and medications, prior to the procedure.  We must know immediately if you are taking blood thinners (like Coumadin/Warfarin) or if you are allergic to IV iodine contrast (dye).  We must know if you could possible be pregnant.  Possible side-effects:   Bleeding from needle site  Infection (rare, may require surgery)    Nerve injury (rare)  Numbness & tingling (temporary)  Difficulty urinating (rare,  temporary)  Spinal headache (a headache worse with upright posture)  Light-headedness (temporary)  Pain at injection site (serveral days)  Decreased blood pressure (rare, temporary)  Weakness in arm/leg (temporary)  Pressure sensation in back/neck (temporary)   Call if you experience:   Fever/chills associated with headache or increased back/neck pain  Headache worsened by an upright position  New onset, weakness or numbness of an extremity below the injection site  Hives or difficulty breathing (go to the emergency room)  Inflammation or drainage at the injection site(s)  Severe back/neck pain greater than usual  New symptoms which are concerning to you  Please note:  Although the local anesthetic injected can often make your back or neck feel good for several hours after the injection, the pain will likely return. It takes 3-7 days for steroids to work.  You may not notice any pain relief for at least one week.  If effective, we will often do a series of 2-3 injections spaced 3-6 weeks apart to maximally decrease your pain.  After the initial series, you may be a candidate for a more permanent nerve block of the facets.  If you have any questions, please call [phone] [facility] 

## 2015-08-20 LAB — TOXASSURE SELECT 13 (MW), URINE: PDF: 0

## 2015-11-15 ENCOUNTER — Encounter: Payer: Medicare Other | Admitting: Pain Medicine

## 2015-11-17 ENCOUNTER — Encounter: Payer: Self-pay | Admitting: Pain Medicine

## 2015-11-17 ENCOUNTER — Ambulatory Visit: Payer: Medicare Other | Attending: Pain Medicine | Admitting: Pain Medicine

## 2015-11-17 VITALS — BP 134/54 | HR 44 | Temp 98.0°F | Resp 16 | Ht 68.0 in | Wt 227.0 lb

## 2015-11-17 DIAGNOSIS — C679 Malignant neoplasm of bladder, unspecified: Secondary | ICD-10-CM | POA: Diagnosis not present

## 2015-11-17 DIAGNOSIS — Z6834 Body mass index (BMI) 34.0-34.9, adult: Secondary | ICD-10-CM | POA: Diagnosis not present

## 2015-11-17 DIAGNOSIS — Z96653 Presence of artificial knee joint, bilateral: Secondary | ICD-10-CM | POA: Diagnosis not present

## 2015-11-17 DIAGNOSIS — M25512 Pain in left shoulder: Secondary | ICD-10-CM | POA: Diagnosis not present

## 2015-11-17 DIAGNOSIS — M542 Cervicalgia: Secondary | ICD-10-CM

## 2015-11-17 DIAGNOSIS — E7800 Pure hypercholesterolemia, unspecified: Secondary | ICD-10-CM | POA: Insufficient documentation

## 2015-11-17 DIAGNOSIS — M62830 Muscle spasm of back: Secondary | ICD-10-CM | POA: Diagnosis not present

## 2015-11-17 DIAGNOSIS — M47816 Spondylosis without myelopathy or radiculopathy, lumbar region: Secondary | ICD-10-CM

## 2015-11-17 DIAGNOSIS — Z5181 Encounter for therapeutic drug level monitoring: Secondary | ICD-10-CM

## 2015-11-17 DIAGNOSIS — G8929 Other chronic pain: Secondary | ICD-10-CM | POA: Insufficient documentation

## 2015-11-17 DIAGNOSIS — M25562 Pain in left knee: Secondary | ICD-10-CM | POA: Diagnosis not present

## 2015-11-17 DIAGNOSIS — M25552 Pain in left hip: Secondary | ICD-10-CM | POA: Diagnosis not present

## 2015-11-17 DIAGNOSIS — M25519 Pain in unspecified shoulder: Secondary | ICD-10-CM | POA: Diagnosis present

## 2015-11-17 DIAGNOSIS — E782 Mixed hyperlipidemia: Secondary | ICD-10-CM | POA: Diagnosis not present

## 2015-11-17 DIAGNOSIS — M545 Low back pain, unspecified: Secondary | ICD-10-CM

## 2015-11-17 DIAGNOSIS — R202 Paresthesia of skin: Secondary | ICD-10-CM | POA: Insufficient documentation

## 2015-11-17 DIAGNOSIS — M25511 Pain in right shoulder: Secondary | ICD-10-CM | POA: Diagnosis not present

## 2015-11-17 DIAGNOSIS — I4891 Unspecified atrial fibrillation: Secondary | ICD-10-CM | POA: Diagnosis not present

## 2015-11-17 DIAGNOSIS — I10 Essential (primary) hypertension: Secondary | ICD-10-CM | POA: Diagnosis not present

## 2015-11-17 DIAGNOSIS — Z8781 Personal history of (healed) traumatic fracture: Secondary | ICD-10-CM | POA: Diagnosis not present

## 2015-11-17 DIAGNOSIS — M25561 Pain in right knee: Secondary | ICD-10-CM | POA: Insufficient documentation

## 2015-11-17 DIAGNOSIS — M25551 Pain in right hip: Secondary | ICD-10-CM | POA: Insufficient documentation

## 2015-11-17 DIAGNOSIS — M25559 Pain in unspecified hip: Secondary | ICD-10-CM | POA: Diagnosis present

## 2015-11-17 DIAGNOSIS — Z79891 Long term (current) use of opiate analgesic: Secondary | ICD-10-CM | POA: Diagnosis not present

## 2015-11-17 DIAGNOSIS — F1190 Opioid use, unspecified, uncomplicated: Secondary | ICD-10-CM

## 2015-11-17 DIAGNOSIS — R209 Unspecified disturbances of skin sensation: Secondary | ICD-10-CM | POA: Insufficient documentation

## 2015-11-17 MED ORDER — MORPHINE SULFATE ER 30 MG PO TBCR: 30.0000 mg | EXTENDED_RELEASE_TABLET | Freq: Two times a day (BID) | ORAL | Status: DC

## 2015-11-17 MED ORDER — OXYCODONE-ACETAMINOPHEN 10-325 MG PO TABS: 1.0000 | ORAL_TABLET | Freq: Three times a day (TID) | ORAL | Status: DC | PRN

## 2015-11-17 NOTE — Patient Instructions (Signed)
Instructed to get labwork and xrays done today in the medical mall.

## 2015-11-17 NOTE — Progress Notes (Signed)
Safety precautions to be maintained throughout the outpatient stay will include: orient to surroundings, keep bed in low position, maintain call bell within reach at all times, provide assistance with transfer out of bed and ambulation.  MS Contin 30 mg #2 out of 60 remaining. Filled 10-20-15. Percocet 10/325 mg #5 out of 90 remaining. Filled 10-19-16.

## 2015-11-17 NOTE — Progress Notes (Signed)
Patient's Name: Aaron Short  Patient type: Established  MRN: [id number]  Service setting: Ambulatory outpatient  DOB: 1946-04-04  Location: [facility]  DOS: 11/17/2015  Primary Care Physician: No primary care provider on file.  Note by: Jazlyne Gauger A. Dossie Arbour, M.D, DABA, DABAPM, DABPM, DABIPP, FIPP  Referring Physician: No ref. provider found  Specialty: Board-Certified Interventional Pain Management  Last Visit to Pain Management: 08/16/2015   Primary Reason(s) for Visit: Encounter for prescription drug management (Level of risk: moderate) CC: Shoulder Pain and Hip Pain   HPI  Aaron Short is a 70 y.o. year old, male patient, who returns today as an established patient. He has Chronic pain; Long term current use of opiate analgesic; Long term prescription opiate use; Opiate use (105 MME/Day); Encounter for therapeutic drug level monitoring; Encounter for chronic pain management; Opioid dependence, daily use (Aaron Short); Chronic hip pain (Location of Secondary source of pain) (Bilateral) (R>L); Chronic shoulder pain (Location of Primary Source of Pain) (Bilateral) (L>R); Chronic low back pain (Location of Tertiary source of pain) (Bilateral) (R>L); Chronic knee pain (Bilateral) (R>L); S/P THR: total hip replacement (Bilateral); S/P TKR: total knee replacement (Bilateral); Adiposity; Atrial fibrillation (Aaron Short); Malignant neoplasm of urinary bladder (Aaron Short); Type 2 diabetes mellitus (Aaron Short); Essential (primary) hypertension; Muscle spasticity; Long term current use of anticoagulant; Combined fat and carbohydrate induced hyperlipemia; Pure hypercholesterolemia; History of bladder cancer; History of hip fracture; History of pelvic fracture; History of anticoagulant therapy; Chronic neck pain; Chondrocalcinosis; Radicular pain of shoulder; History of shoulder surgery; Myofascial pain; Musculoskeletal pain; Muscle spasm; Lumbar facet syndrome (Bilateral) (R>L); Lumbar spondylosis;  Morbid obesity (Aaron Short); Continuous opioid dependence (Aaron Short); and Disturbance of skin sensation on his problem list.. His primarily concern today is the Shoulder Pain and Hip Pain   Pain Assessment: Self-Reported Pain Score: 1  Reported level is compatible with observation       Pain Type: Chronic pain Pain Location: Shoulder Pain Orientation: Right, Left Pain Descriptors / Indicators: Stabbing, Dull Pain Frequency: Intermittent  The patient comes into the clinics today for pharmacological management of his chronic pain. I last saw this patient on 08/16/2015. The patient  reports that he does not use illicit drugs. His body mass index is 34.52 kg/(m^2).       Controlled Substance Pharmacotherapy Assessment & REMS (Risk Evaluation and Mitigation Strategy)  Analgesic: MS Contin 30 mg every 12 hours (60 mg/day) plus oxycodone/APAP 10/325 every 8 hours (30 mg/day) Pill Count: MS Contin 30 mg #2 out of 60 remaining. Filled 10-20-15. Percocet 10/325 mg #5 out of 90 remaining. Filled 10-19-16 MME/day: 105 mg/day Pharmacokinetics: Onset of action (Liberation/Absorption): Within expected pharmacological parameters Time to Peak effect (Distribution): Timing and results are as within normal expected parameters Duration of action (Metabolism/Excretion): Within normal limits for medication Pharmacodynamics: Analgesic Effect: More than 50% Activity Facilitation: Medication(s) allow patient to sit, stand, walk, and do the basic ADLs Perceived Effectiveness: Described as relatively effective, allowing for increase in activities of daily living (ADL) Side-effects or Adverse reactions: None reported Monitoring: Aaron Short PMP: Online review of the past 3-monthperiod conducted. Compliant with practice rules and regulations Last UDS on record: TOXASSURE SELECT 13  Date Value Ref Range Status  08/16/2015 FINAL  Final    Comment:    ==================================================================== TOXASSURE  SELECT 13 (MW) ==================================================================== Test                             Result  Flag       Units Drug Present and Declared for Prescription Verification   Morphine                       4859         EXPECTED   ng/mg creat    Potential sources of large amounts of morphine in the absence of    codeine include administration of morphine or use of heroin.   Hydromorphone                  37           EXPECTED   ng/mg creat    Hydromorphone may be present as a metabolite of morphine;    concentrations of hydromorphone rarely exceed 5% of the morphine    concentration when this is the source of hydromorphone.   Oxycodone                      273          EXPECTED   ng/mg creat   Oxymorphone                    934          EXPECTED   ng/mg creat   Noroxycodone                   586          EXPECTED   ng/mg creat   Noroxymorphone                 224          EXPECTED   ng/mg creat    Sources of oxycodone are scheduled prescription medications.    Oxymorphone, noroxycodone, and noroxymorphone are expected    metabolites of oxycodone. Oxymorphone is also available as a    scheduled prescription medication. ==================================================================== Test                      Result    Flag   Units      Ref Range   Creatinine              140              mg/dL      >=20 ==================================================================== Declared Medications:  The flagging and interpretation on this report are based on the  following declared medications.  Unexpected results may arise from  inaccuracies in the declared medications.  **Note: The testing scope of this panel includes these medications:  Morphine (MS Contin)  Oxycodone (Percocet)  **Note: The testing scope of this panel does not include following  reported medications:  Acetaminophen (Percocet)  Carisoprodol (Soma)  Digoxin (Lanoxin)  Furosemide  (Lasix)  Losartan (Cozaar)  Metformin (Glucophage)  Metoprolol (Toprol)  Naloxone  Polyethylene Glycol  Simvastatin (Zocor)  Warfarin (Coumadin) ==================================================================== For clinical consultation, please call [phone]. ====================================================================    UDS interpretation: Compliant          Medication Assessment Form: Reviewed. Patient indicates being compliant with therapy Treatment compliance: Compliant Risk Assessment: Aberrant Behavior: None observed today Substance Use Disorder (SUD) Risk Level: Low-to-moderate Risk of opioid abuse or dependence: 0.7-3.0% with doses ? 36 MME/day and 6.1-26% with doses ? 120 MME/day. Opioid Risk Tool (ORT) Score: Total Score: 0 Low Risk for SUD (Score <3) Depression Scale Score: PHQ-2: PHQ-2 Total Score: 0 No depression (  0) PHQ-9: PHQ-9 Total Score: 0 No depression (0-4)  Pharmacologic Plan: No change in therapy, at this time  Laboratory Chemistry  Inflammation Markers No results found for: ESRSEDRATE, CRP  Renal Function No results found for: BUN, CREATININE, GFRAA, GFRNONAA  Hepatic Function No results found for: AST, ALT, ALBUMIN  Electrolytes No results found for: NA, K, CL, CALCIUM, MG  Pain Modulating Vitamins No results found for: VD25OH, VE938BO1BPZ, WC5852DP8, EU2353IR4, VITAMINB12  Coagulation Parameters No results found for: INR, LABPROT, APTT, PLT  Note: Labs Reviewed.  Recent Diagnostic Imaging  No results found.  Meds  The patient has a current medication list which includes the following prescription(s): fifty50 glucose meter 2.0, carisoprodol, digoxin, furosemide, losartan, metformin, metoprolol succinate, morphine, morphine, morphine, naloxone hcl, oxycodone-acetaminophen, oxycodone-acetaminophen, oxycodone-acetaminophen, simvastatin, and warfarin.  Current Outpatient Prescriptions on File Prior to Visit  Medication Sig   . Blood Glucose Monitoring Suppl (FIFTY50 GLUCOSE METER 2.0) w/Device KIT   . carisoprodol (SOMA) 350 MG tablet Take 350 mg by mouth 2 (two) times daily.   . digoxin (LANOXIN) 0.125 MG tablet Take 0.125 mg by mouth at bedtime.   . furosemide (LASIX) 20 MG tablet Take 20 mg by mouth as needed.   Marland Kitchen losartan (COZAAR) 50 MG tablet Take 50 mg by mouth daily.   . metFORMIN (GLUCOPHAGE) 500 MG tablet Take 500 mg by mouth daily with breakfast.   . metoprolol succinate (TOPROL-XL) 50 MG 24 hr tablet Take 50 mg by mouth.  . Naloxone HCl (NARCAN) 4 MG/0.1ML LIQD Place 1 spray into the nose once. Spray half of bottle content into each nostril, then call 911  . simvastatin (ZOCOR) 40 MG tablet Take 40 mg by mouth at bedtime.   Marland Kitchen warfarin (COUMADIN) 4 MG tablet Take 4 mg by mouth daily at 6 PM.    No current facility-administered medications on file prior to visit.    ROS  Constitutional: Denies any fever or chills Gastrointestinal: No reported hemesis, hematochezia, vomiting, or acute GI distress Musculoskeletal: Denies any acute onset joint swelling, redness, loss of ROM, or weakness Neurological: No reported episodes of acute onset apraxia, aphasia, dysarthria, agnosia, amnesia, paralysis, loss of coordination, or loss of consciousness  Allergies  Mr. Gee is allergic to diltiazem; tizanidine; and penicillins.  [place]  Medical:  Mr. Cahalan  has a past medical history of Hypertension; Diabetes mellitus without complication ([place]); Cancer ([place]); Arthralgia of lower leg (04/02/2012); and Arthralgia of upper arm (06/18/2009). Family: family history includes Dementia in his mother; Heart disease in his father. Surgical:  has past surgical history that includes bladder cancer surgery (2015); Joint replacement; Replacement total hip w/  resurfacing implants (Bilateral); Total knee arthroplasty (Bilateral); and Shoulder surgery (Bilateral). Tobacco:  reports that he has been smoking Cigarettes.   He does not have any smokeless tobacco history on file. Alcohol:  reports that he does not drink alcohol. Drug:  reports that he does not use illicit drugs.  Constitutional Exam  Vitals: Blood pressure 134/54, pulse 44, temperature 98 F (36.7 C), temperature source Oral, resp. rate 16, height _0  (1.727 m), weight 227 lb (102.967 kg), SpO2 100 %. General appearance: Well nourished, well developed, and well hydrated. In no acute distress Calculated BMI/Body habitus: Body mass index is 34.52 kg/(m^2). (30-34.9 kg/m2) Obese (Class I) - 68% higher incidence of chronic pain Psych/Mental status: Alert and oriented x 3 (person, place, & time) Eyes: PERLA Respiratory: No evidence of acute respiratory distress  Cervical Spine Exam  Inspection: No  masses, redness, or swelling Alignment: Symmetrical ROM: Functional: ROM is within functional limits [facility]) Stability: No instability detected Muscle strength & Tone: Functionally intact Sensory: Unimpaired Palpation: No complaints of tenderness  Upper Extremity (UE) Exam    Side: Right upper extremity  Side: Left upper extremity  Inspection: No masses, redness, swelling, or asymmetry  Inspection: No masses, redness, swelling, or asymmetry  ROM:  ROM:  Functional: ROM is within functional limits [facility])  Functional: ROM is within functional limits [facility])  Muscle strength & Tone: Functionally intact  Muscle strength & Tone: Functionally intact  Sensory: Unimpaired  Sensory: Unimpaired  Palpation: Non-contributory  Palpation: Non-contributory   Thoracic Spine Exam  Inspection: No masses, redness, or swelling Alignment: Symmetrical ROM: Functional: ROM is within functional limits [facility]) Stability: No instability detected Sensory: Unimpaired Muscle strength & Tone: Functionally intact Palpation: No complaints of tenderness  Lumbar Spine Exam  Inspection: No masses, redness, or swelling Alignment: Symmetrical ROM: Functional: ROM is within  functional limits [facility]) Stability: No instability detected Muscle strength & Tone: Functionally intact Sensory: Unimpaired Palpation: No complaints of tenderness Provocative Tests: Lumbar Hyperextension and rotation test: deferred Patrick's Maneuver: deferred  Gait & Posture Assessment  Ambulation: Unassisted Gait: Unaffected Posture: WNL  Lower Extremity Exam    Side: Right lower extremity  Side: Left lower extremity  Inspection: No masses, redness, swelling, or asymmetry ROM:  Inspection: No masses, redness, swelling, or asymmetry ROM:  Functional: ROM is within functional limits [facility])  Functional: ROM is within functional limits [facility])  Muscle strength & Tone: Functionally intact  Muscle strength & Tone: Functionally intact  Sensory: Unimpaired  Sensory: Unimpaired  Palpation: Non-contributory  Palpation: Non-contributory   Assessment & Plan  Primary Diagnosis & Pertinent Problem List: The primary encounter diagnosis was Chronic pain. Diagnoses of Encounter for therapeutic drug level monitoring, Long term current use of opiate analgesic, Chronic shoulder pain (Location of Primary Source of Pain) (Bilateral) (L>R), Lumbar facet syndrome (Bilateral) (R>L), Chronic hip pain, unspecified laterality, Chronic low back pain (Location of Tertiary source of pain) (Bilateral) (R>L), Chronic knee pain (Bilateral) (R>L), Chronic neck pain, Lumbar spondylosis, unspecified spinal osteoarthritis, Opiate use (105 MME/Day), Malignant neoplasm of urinary bladder, unspecified site ([place]), and Disturbance of skin sensation were also pertinent to this visit.  Visit Diagnosis: 1. Chronic pain   2. Encounter for therapeutic drug level monitoring   3. Long term current use of opiate analgesic   4. Chronic shoulder pain (Location of Primary Source of Pain) (Bilateral) (L>R)   5. Lumbar facet syndrome (Bilateral) (R>L)   6. Chronic hip pain, unspecified laterality   7. Chronic low back pain (Location of  Tertiary source of pain) (Bilateral) (R>L)   8. Chronic knee pain (Bilateral) (R>L)   9. Chronic neck pain   10. Lumbar spondylosis, unspecified spinal osteoarthritis   11. Opiate use (105 MME/Day)   12. Malignant neoplasm of urinary bladder, unspecified site ([place])   13. Disturbance of skin sensation     Problems updated and reviewed during this visit: Problem  Muscle Spasticity   Last Assessment & Plan:  Well controlled Has failed prior trial of decreasing soma to 233m dose bid so will cont 3545mbid dose as this controls sxs well.  Several other muscle relaxers have been tried in past years not worked in past as well.  I have urged caution and to watch for SEs esp given daily opioid use. I am glad to hear that Dr. NaDossie Arbourill be decreasing his opioid doses.  Urged limit driving while on soma and opioids.  Has UTD signed controlled substance contract on chart from 11/16. UDS done today. Reviewed controlled substance registry, showing appropriate use. Given long term stability, 3 30d rxs, postdated, were given today Last Assessment & Plan:  Well controlled Has failed prior trial of decreasing soma to 221m dose bid.  Several other muscle relaxers have been tried in past years not worked in past as well.  Agreed to cont 3565mdose, but urged caution and to watch for SEs esp given daily opioid use. Urged limit driving while on soma and opioids. He will ask his pain mgmt doc about a narcan rx for rescue use at home (see pt instructions)   Continuous Opioid Dependence (Hcc)   Last Assessment & Plan:  Managed [place]. [place]inda [place] on morphine, percocet   Long Term Current Use of Anticoagulant   Last Assessment & Plan:  INR high 3.6 10/04/15 on 83m50md warfarin. Advised pt that his new dose of warfarin 83mg105m x none on Tuesdays is appropriate-will see what his INR is at next INR visit 5/22. Will have nurse update anticoag tracker which was not updated  10/04/15. Last Assessment & Plan:  INR at goal today. No change in warfarin dose (83mg/39m He will start holding warfarin ~07/08/15 in anticipation of upcoming colonoscopy 07/13/15 (5d prior) at advice of his cardiologist. Will restart day after scope so we will recheck ~1wk after that (so ~07/20/15). He will f/u for [place] then Last Assessment & Plan:  INR at goal 2.3 today. Continue current dose warfarin and next INR due 4wks   Disturbance of Skin Sensation  Adiposity    Problem-specific Plan(s): No problem-specific assessment & plan notes found for this encounter.  No new assessment & plan notes have been filed under this hospital service since the last note was generated. Service: Pain Management   Plan of Care   Problem List Items Addressed This Visit      High   Chronic hip pain (Location of Secondary source of pain) (Bilateral) (R>L) (Chronic)   Relevant Medications   morphine (MS CONTIN) 30 MG 12 hr tablet   morphine (MS CONTIN) 30 MG 12 hr tablet   morphine (MS CONTIN) 30 MG 12 hr tablet   oxyCODONE-acetaminophen (PERCOCET) 10-325 MG tablet   oxyCODONE-acetaminophen (PERCOCET) 10-325 MG tablet   oxyCODONE-acetaminophen (PERCOCET) 10-325 MG tablet   Other Relevant Orders   DG HIP UNILAT W OR W/O PELVIS 2-3 VIEWS LEFT   DG HIP UNILAT W OR W/O PELVIS 2-3 VIEWS RIGHT   Chronic knee pain (Bilateral) (R>L) (Chronic)   Relevant Medications   morphine (MS CONTIN) 30 MG 12 hr tablet   morphine (MS CONTIN) 30 MG 12 hr tablet   morphine (MS CONTIN) 30 MG 12 hr tablet   oxyCODONE-acetaminophen (PERCOCET) 10-325 MG tablet   oxyCODONE-acetaminophen (PERCOCET) 10-325 MG tablet   oxyCODONE-acetaminophen (PERCOCET) 10-325 MG tablet   Other Relevant Orders   DG Knee 1-2 Views Left   DG Knee 1-2 Views Right   Chronic low back pain (Location of Tertiary source of pain) (Bilateral) (R>L) (Chronic)   Relevant Medications   morphine (MS CONTIN) 30 MG 12 hr tablet   morphine (MS CONTIN) 30 MG  12 hr tablet   morphine (MS CONTIN) 30 MG 12 hr tablet   oxyCODONE-acetaminophen (PERCOCET) 10-325 MG tablet   oxyCODONE-acetaminophen (PERCOCET) 10-325 MG tablet   oxyCODONE-acetaminophen (PERCOCET) 10-325 MG tablet   Chronic  neck pain (Chronic)   Relevant Medications   morphine (MS CONTIN) 30 MG 12 hr tablet   morphine (MS CONTIN) 30 MG 12 hr tablet   morphine (MS CONTIN) 30 MG 12 hr tablet   oxyCODONE-acetaminophen (PERCOCET) 10-325 MG tablet   oxyCODONE-acetaminophen (PERCOCET) 10-325 MG tablet   oxyCODONE-acetaminophen (PERCOCET) 10-325 MG tablet   Other Relevant Orders   DG Cervical Spine Complete   Chronic pain - Primary (Chronic)   Relevant Medications   morphine (MS CONTIN) 30 MG 12 hr tablet   morphine (MS CONTIN) 30 MG 12 hr tablet   morphine (MS CONTIN) 30 MG 12 hr tablet   oxyCODONE-acetaminophen (PERCOCET) 10-325 MG tablet   oxyCODONE-acetaminophen (PERCOCET) 10-325 MG tablet   oxyCODONE-acetaminophen (PERCOCET) 10-325 MG tablet   Other Relevant Orders   Comprehensive metabolic panel   C-reactive protein   Magnesium   Sedimentation rate   25-Hydroxyvitamin D Lcms D2+D3   Chronic shoulder pain (Location of Primary Source of Pain) (Bilateral) (L>R) (Chronic)   Relevant Orders   DG Shoulder Left   DG Shoulder Right   Lumbar facet syndrome (Bilateral) (R>L) (Chronic)   Relevant Medications   morphine (MS CONTIN) 30 MG 12 hr tablet   morphine (MS CONTIN) 30 MG 12 hr tablet   morphine (MS CONTIN) 30 MG 12 hr tablet   oxyCODONE-acetaminophen (PERCOCET) 10-325 MG tablet   oxyCODONE-acetaminophen (PERCOCET) 10-325 MG tablet   oxyCODONE-acetaminophen (PERCOCET) 10-325 MG tablet   Lumbar spondylosis (Chronic)   Relevant Medications   morphine (MS CONTIN) 30 MG 12 hr tablet   morphine (MS CONTIN) 30 MG 12 hr tablet   morphine (MS CONTIN) 30 MG 12 hr tablet   oxyCODONE-acetaminophen (PERCOCET) 10-325 MG tablet   oxyCODONE-acetaminophen (PERCOCET) 10-325 MG tablet    oxyCODONE-acetaminophen (PERCOCET) 10-325 MG tablet     Medium   Encounter for therapeutic drug level monitoring   Long term current use of opiate analgesic (Chronic)   Relevant Orders   ToxASSURE Select 13 (MW), Urine   Opiate use (105 MME/Day) (Chronic)     Low   Disturbance of skin sensation   Relevant Orders   Vitamin B12     Unprioritized   Malignant neoplasm of urinary bladder (HCC)   Relevant Medications   morphine (MS CONTIN) 30 MG 12 hr tablet   morphine (MS CONTIN) 30 MG 12 hr tablet   morphine (MS CONTIN) 30 MG 12 hr tablet   oxyCODONE-acetaminophen (PERCOCET) 10-325 MG tablet   oxyCODONE-acetaminophen (PERCOCET) 10-325 MG tablet   oxyCODONE-acetaminophen (PERCOCET) 10-325 MG tablet       Pharmacotherapy (Medications Ordered): Meds ordered this encounter  Medications  . morphine (MS CONTIN) 30 MG 12 hr tablet    Sig: Take 1 tablet (30 mg total) by mouth every 12 (twelve) hours.    Dispense:  60 tablet    Refill:  0    Do not place this medication, or any other prescription from our practice, on "Automatic Refill". Patient may have prescription filled one day early if pharmacy is closed on scheduled refill date. Do not fill until: 11/20/15 To last until: 12/20/15  . morphine (MS CONTIN) 30 MG 12 hr tablet    Sig: Take 1 tablet (30 mg total) by mouth every 12 (twelve) hours.    Dispense:  60 tablet    Refill:  0    Do not place this medication, or any other prescription from our practice, on "Automatic Refill". Patient may have prescription filled one day early  if pharmacy is closed on scheduled refill date. Do not fill until: 12/20/15 To last until: 01/19/16  . morphine (MS CONTIN) 30 MG 12 hr tablet    Sig: Take 1 tablet (30 mg total) by mouth every 12 (twelve) hours.    Dispense:  60 tablet    Refill:  0    Do not place this medication, or any other prescription from our practice, on "Automatic Refill". Patient may have prescription filled one day early if  pharmacy is closed on scheduled refill date. Do not fill until: 01/19/16 To last until: 02/18/16  . oxyCODONE-acetaminophen (PERCOCET) 10-325 MG tablet    Sig: Take 1 tablet by mouth every 8 (eight) hours as needed for pain.    Dispense:  90 tablet    Refill:  0    Do not place this medication, or any other prescription from our practice, on "Automatic Refill". Patient may have prescription filled one day early if pharmacy is closed on scheduled refill date. Do not fill until: 11/20/15 To last until: 12/20/15  . oxyCODONE-acetaminophen (PERCOCET) 10-325 MG tablet    Sig: Take 1 tablet by mouth every 8 (eight) hours as needed for pain.    Dispense:  90 tablet    Refill:  0    Do not place this medication, or any other prescription from our practice, on "Automatic Refill". Patient may have prescription filled one day early if pharmacy is closed on scheduled refill date. Do not fill until: 12/20/15 To last until: 01/19/16  . oxyCODONE-acetaminophen (PERCOCET) 10-325 MG tablet    Sig: Take 1 tablet by mouth every 8 (eight) hours as needed for pain.    Dispense:  90 tablet    Refill:  0    Do not place this medication, or any other prescription from our practice, on "Automatic Refill". Patient may have prescription filled one day early if pharmacy is closed on scheduled refill date. Do not fill until: 01/19/16 To last until: 02/18/16    [facility] & Procedure Ordered: Orders Placed This Encounter  Procedures  . DG Shoulder Left  . DG Shoulder Right  . DG HIP UNILAT W OR W/O PELVIS 2-3 VIEWS LEFT  . DG HIP UNILAT W OR W/O PELVIS 2-3 VIEWS RIGHT  . DG Knee 1-2 Views Left  . DG Knee 1-2 Views Right  . DG Cervical Spine Complete  . ToxASSURE Select 13 (MW), Urine  . Comprehensive metabolic panel  . C-reactive protein  . Magnesium  . Sedimentation rate  . Vitamin B12  . 25-Hydroxyvitamin D Lcms D2+D3    Imaging Ordered: DG SHOULDER LEFT DG SHOULDER RIGHT DG HIP UNILAT W OR W/O  PELVIS 2-3 VIEWS LEFT DG HIP UNILAT W OR W/O PELVIS 2-3 VIEWS RIGHT DG KNEE 1-2 VIEWS LEFT DG KNEE 1-2 VIEWS RIGHT DG CERVICAL SPINE COMPLETE  Interventional Therapies: Scheduled:  None at this time.    Considering:  Suprascapular nerve block.   PRN Procedures:  None at this time.    Referral(s) or Consult(s): None at this time.  New Prescriptions   No medications on file    Medications administered during this visit: Mr. Camacho had no medications administered during this visit.  Requested PM Follow-up: Return in about 2 months (around 01/31/2016) for Medication Management, (3-Mo).  Future Appointments Date Time Provider [place]  01/31/2016 9:20 AM Milinda Pointer, MD [facility] None    Primary Care Physician: No primary care provider on file. Location: [facility] Note by:  Amer Alcindor A. Dossie Arbour, M.D, DABA, DABAPM, DABPM, DABIPP, FIPP  Pain Score Disclaimer: We use the NRS-11 scale. This is a self-reported, subjective measurement of pain severity with only modest accuracy. It is used primarily to identify changes within a particular patient. It must be understood that outpatient pain scales are significantly less accurate that those used for research, where they can be applied under ideal controlled circumstances with minimal exposure to variables. In reality, the score is likely to be a combination of pain intensity and pain affect, where pain affect describes the degree of emotional arousal or changes in action readiness caused by the sensory experience of pain. Factors such as social and work situation, setting, emotional state, anxiety levels, expectation, and prior pain experience may influence pain perception and show large inter-individual differences that may also be affected by time variables.  Patient instructions provided during this appointment: Patient Instructions  Instructed to get labwork and xrays done today in the  [facility].

## 2015-11-18 ENCOUNTER — Ambulatory Visit
Admission: RE | Admit: 2015-11-18 | Discharge: 2015-11-18 | Disposition: A | Discharge status: Home / Self Care | Payer: Medicare Other | Source: Ambulatory Visit | Attending: Pain Medicine | Admitting: Pain Medicine

## 2015-11-18 ENCOUNTER — Other Ambulatory Visit
Admission: RE | Admit: 2015-11-18 | Discharge: 2015-11-18 | Disposition: A | Discharge status: Home / Self Care | Payer: Medicare Other | Source: Ambulatory Visit | Attending: Pain Medicine | Admitting: Pain Medicine

## 2015-11-18 DIAGNOSIS — M25512 Pain in left shoulder: Secondary | ICD-10-CM | POA: Insufficient documentation

## 2015-11-18 DIAGNOSIS — M25561 Pain in right knee: Secondary | ICD-10-CM

## 2015-11-18 DIAGNOSIS — G8929 Other chronic pain: Secondary | ICD-10-CM

## 2015-11-18 DIAGNOSIS — M545 Low back pain: Secondary | ICD-10-CM | POA: Diagnosis present

## 2015-11-18 DIAGNOSIS — M25559 Pain in unspecified hip: Secondary | ICD-10-CM | POA: Insufficient documentation

## 2015-11-18 DIAGNOSIS — R209 Unspecified disturbances of skin sensation: Secondary | ICD-10-CM

## 2015-11-18 DIAGNOSIS — M5136 Other intervertebral disc degeneration, lumbar region: Secondary | ICD-10-CM | POA: Diagnosis not present

## 2015-11-18 DIAGNOSIS — Z96643 Presence of artificial hip joint, bilateral: Secondary | ICD-10-CM | POA: Diagnosis not present

## 2015-11-18 DIAGNOSIS — M542 Cervicalgia: Secondary | ICD-10-CM | POA: Insufficient documentation

## 2015-11-18 DIAGNOSIS — M19012 Primary osteoarthritis, left shoulder: Secondary | ICD-10-CM | POA: Insufficient documentation

## 2015-11-18 DIAGNOSIS — M50323 Other cervical disc degeneration at C6-C7 level: Secondary | ICD-10-CM | POA: Insufficient documentation

## 2015-11-18 DIAGNOSIS — Z96653 Presence of artificial knee joint, bilateral: Secondary | ICD-10-CM | POA: Diagnosis not present

## 2015-11-18 DIAGNOSIS — M25511 Pain in right shoulder: Secondary | ICD-10-CM | POA: Diagnosis not present

## 2015-11-18 DIAGNOSIS — I739 Peripheral vascular disease, unspecified: Secondary | ICD-10-CM | POA: Insufficient documentation

## 2015-11-18 DIAGNOSIS — M25562 Pain in left knee: Secondary | ICD-10-CM

## 2015-11-18 DIAGNOSIS — M2578 Osteophyte, vertebrae: Secondary | ICD-10-CM | POA: Insufficient documentation

## 2015-11-18 DIAGNOSIS — M8588 Other specified disorders of bone density and structure, other site: Secondary | ICD-10-CM | POA: Insufficient documentation

## 2015-11-18 DIAGNOSIS — M19011 Primary osteoarthritis, right shoulder: Secondary | ICD-10-CM | POA: Insufficient documentation

## 2015-11-18 LAB — COMPREHENSIVE METABOLIC PANEL
ALBUMIN: 4.1 g/dL (ref 3.5–5.0)
ALK PHOS: 68 U/L (ref 38–126)
ALT: 17 U/L (ref 17–63)
ANION GAP: 7 (ref 5–15)
AST: 19 U/L (ref 15–41)
BILIRUBIN TOTAL: 0.4 mg/dL (ref 0.3–1.2)
BUN: 15 mg/dL (ref 6–20)
CALCIUM: 9.3 mg/dL (ref 8.9–10.3)
CO2: 29 mmol/L (ref 22–32)
Chloride: 103 mmol/L (ref 101–111)
Creatinine, Ser: 0.8 mg/dL (ref 0.61–1.24)
GFR calc Af Amer: >60 mL/min (ref >60)
GFR calc non Af Amer: >60 mL/min (ref >60)
GLUCOSE: 108 mg/dL — AB (ref 65–99)
Potassium: 4.3 mmol/L (ref 3.5–5.1)
SODIUM: 139 mmol/L (ref 135–145)
Total Protein: 7.2 g/dL (ref 6.5–8.1)

## 2015-11-18 LAB — VITAMIN B12: VITAMIN B 12: 268 pg/mL (ref 180–914)

## 2015-11-18 LAB — MAGNESIUM: Magnesium: 1.8 mg/dL (ref 1.7–2.4)

## 2015-11-18 LAB — C-REACTIVE PROTEIN: CRP: <0.5 mg/dL (ref <1.0)

## 2015-11-18 LAB — SEDIMENTATION RATE: SED RATE: 6 mm/h (ref 0–20)

## 2015-11-18 NOTE — Progress Notes (Signed)
Quick Note:   Normal fasting (NPO x 8 hours) glucose levels are between 65-99 mg/dl, with 2 hour fasting, levels are usually less than 140 mg/dl. Any random blood glucose level greater than 200 mg/dl is considered to be Diabetes.  ______ 

## 2015-11-22 LAB — 25-HYDROXYVITAMIN D LCMS D2+D3
25-HYDROXY, VITAMIN D-3: 30 ng/mL
25-HYDROXY, VITAMIN D: 31 ng/mL

## 2015-11-24 LAB — TOXASSURE SELECT 13 (MW), URINE: PDF: 0

## 2015-12-08 ENCOUNTER — Telehealth: Payer: Self-pay | Admitting: Pain Medicine

## 2015-12-08 NOTE — Telephone Encounter (Signed)
Patient is leaving around 10 am on 12-15-15 to go to [place] for couple weeks, he wants to see if he can get his meds filled that morning as [place] will not fill out of state scripts.  Script is due to be filled on 12-20-15.  I informed patient that Dr. Dossie Arbour was out of town until 19th and he may not get an answer until that morning.

## 2015-12-15 ENCOUNTER — Telehealth: Payer: Self-pay

## 2015-12-15 NOTE — Telephone Encounter (Signed)
Patient advised there will be no early refills.

## 2015-12-15 NOTE — Telephone Encounter (Signed)
Pts pharmacy call to request an early refill because pt is going out of town on 7/24 and wants meds early

## 2016-01-05 ENCOUNTER — Other Ambulatory Visit: Payer: Self-pay

## 2016-01-07 DIAGNOSIS — F17200 Nicotine dependence, unspecified, uncomplicated: Secondary | ICD-10-CM | POA: Insufficient documentation

## 2016-01-31 ENCOUNTER — Encounter: Payer: Medicare Other | Admitting: Pain Medicine

## 2016-02-07 ENCOUNTER — Other Ambulatory Visit: Payer: Self-pay | Admitting: Pain Medicine

## 2016-02-07 ENCOUNTER — Encounter: Payer: Self-pay | Admitting: Pain Medicine

## 2016-02-07 ENCOUNTER — Ambulatory Visit: Payer: Medicare Other | Attending: Pain Medicine | Admitting: Pain Medicine

## 2016-02-07 VITALS — BP 129/61 | HR 55 | Temp 97.8°F | Resp 18 | Ht 68.0 in | Wt 220.0 lb

## 2016-02-07 DIAGNOSIS — I4891 Unspecified atrial fibrillation: Secondary | ICD-10-CM | POA: Insufficient documentation

## 2016-02-07 DIAGNOSIS — M542 Cervicalgia: Secondary | ICD-10-CM | POA: Insufficient documentation

## 2016-02-07 DIAGNOSIS — I10 Essential (primary) hypertension: Secondary | ICD-10-CM | POA: Diagnosis not present

## 2016-02-07 DIAGNOSIS — I482 Chronic atrial fibrillation, unspecified: Secondary | ICD-10-CM

## 2016-02-07 DIAGNOSIS — Z8551 Personal history of malignant neoplasm of bladder: Secondary | ICD-10-CM | POA: Diagnosis not present

## 2016-02-07 DIAGNOSIS — R200 Anesthesia of skin: Secondary | ICD-10-CM | POA: Diagnosis not present

## 2016-02-07 DIAGNOSIS — M545 Low back pain: Secondary | ICD-10-CM | POA: Diagnosis not present

## 2016-02-07 DIAGNOSIS — M25559 Pain in unspecified hip: Secondary | ICD-10-CM | POA: Insufficient documentation

## 2016-02-07 DIAGNOSIS — Z6833 Body mass index (BMI) 33.0-33.9, adult: Secondary | ICD-10-CM | POA: Diagnosis not present

## 2016-02-07 DIAGNOSIS — M25511 Pain in right shoulder: Secondary | ICD-10-CM | POA: Insufficient documentation

## 2016-02-07 DIAGNOSIS — M25519 Pain in unspecified shoulder: Secondary | ICD-10-CM | POA: Insufficient documentation

## 2016-02-07 DIAGNOSIS — Z96643 Presence of artificial hip joint, bilateral: Secondary | ICD-10-CM | POA: Diagnosis not present

## 2016-02-07 DIAGNOSIS — Z96653 Presence of artificial knee joint, bilateral: Secondary | ICD-10-CM | POA: Insufficient documentation

## 2016-02-07 DIAGNOSIS — M2578 Osteophyte, vertebrae: Secondary | ICD-10-CM | POA: Insufficient documentation

## 2016-02-07 DIAGNOSIS — M47816 Spondylosis without myelopathy or radiculopathy, lumbar region: Secondary | ICD-10-CM | POA: Diagnosis not present

## 2016-02-07 DIAGNOSIS — Z5181 Encounter for therapeutic drug level monitoring: Secondary | ICD-10-CM

## 2016-02-07 DIAGNOSIS — F17210 Nicotine dependence, cigarettes, uncomplicated: Secondary | ICD-10-CM | POA: Insufficient documentation

## 2016-02-07 DIAGNOSIS — M25551 Pain in right hip: Secondary | ICD-10-CM | POA: Insufficient documentation

## 2016-02-07 DIAGNOSIS — M25512 Pain in left shoulder: Secondary | ICD-10-CM | POA: Diagnosis present

## 2016-02-07 DIAGNOSIS — M5412 Radiculopathy, cervical region: Secondary | ICD-10-CM

## 2016-02-07 DIAGNOSIS — Z79891 Long term (current) use of opiate analgesic: Secondary | ICD-10-CM | POA: Diagnosis not present

## 2016-02-07 DIAGNOSIS — M791 Myalgia: Secondary | ICD-10-CM | POA: Diagnosis not present

## 2016-02-07 DIAGNOSIS — F1190 Opioid use, unspecified, uncomplicated: Secondary | ICD-10-CM

## 2016-02-07 DIAGNOSIS — M62838 Other muscle spasm: Secondary | ICD-10-CM | POA: Diagnosis not present

## 2016-02-07 DIAGNOSIS — Z7901 Long term (current) use of anticoagulants: Secondary | ICD-10-CM | POA: Diagnosis not present

## 2016-02-07 DIAGNOSIS — G8929 Other chronic pain: Secondary | ICD-10-CM | POA: Diagnosis not present

## 2016-02-07 DIAGNOSIS — M25552 Pain in left hip: Secondary | ICD-10-CM | POA: Diagnosis not present

## 2016-02-07 DIAGNOSIS — M1120 Other chondrocalcinosis, unspecified site: Secondary | ICD-10-CM | POA: Diagnosis not present

## 2016-02-07 DIAGNOSIS — M5030 Other cervical disc degeneration, unspecified cervical region: Secondary | ICD-10-CM | POA: Insufficient documentation

## 2016-02-07 DIAGNOSIS — Z8249 Family history of ischemic heart disease and other diseases of the circulatory system: Secondary | ICD-10-CM | POA: Insufficient documentation

## 2016-02-07 MED ORDER — OXYCODONE-ACETAMINOPHEN 10-325 MG PO TABS: 1.0000 | ORAL_TABLET | Freq: Three times a day (TID) | ORAL | 0 refills | Status: DC | PRN

## 2016-02-07 MED ORDER — MORPHINE SULFATE ER 30 MG PO TBCR: 30.0000 mg | EXTENDED_RELEASE_TABLET | Freq: Two times a day (BID) | ORAL | 0 refills | Status: DC

## 2016-02-07 NOTE — Progress Notes (Signed)
Safety precautions to be maintained throughout the outpatient stay will include: orient to surroundings, keep bed in low position, maintain call bell within reach at all times, provide assistance with transfer out of bed and ambulation.  Pill count Bottle labeled Morphine 30 mg # 16/60  Filled 01-19-16 Bottle labeled oxycodone 10/325 mg # 24/90  Filled 01-19-16

## 2016-02-07 NOTE — Progress Notes (Signed)
Patient's Name: Aaron Short  MRN: [id number]  Referring Provider: Neomia Dear, MD  DOB: Aug 30, 1945  PCP: Neomia Dear, MD  DOS: 02/07/2016  Note by: Kathlen Brunswick. Dossie Arbour, MD  Service setting: Ambulatory outpatient  Specialty: Interventional Pain Management  Location: [facility] (AMB) Pain Management Facility    Patient type: Established   Primary Reason(s) for Visit: Encounter for prescription drug management (Level of risk: moderate) CC: Shoulder Pain (left) and Joint Pain   HPI  Aaron Short is a 70 y.o. year old, male patient, who returns today as an established patient. He has Chronic pain; Long term current use of opiate analgesic; Long term prescription opiate use; Opiate use (105 MME/Day); Encounter for therapeutic drug level monitoring; Encounter for chronic pain management; Opioid dependence, daily use ([place]); Chronic hip pain (Location of Secondary source of pain) (Bilateral) (R>L); Chronic shoulder pain (Location of Primary Source of Pain) (Bilateral) (L>R); Chronic low back pain (Location of Tertiary source of pain) (Bilateral) (R>L); Chronic knee pain (Bilateral) (R>L); S/P THR: total hip replacement (Bilateral); S/P TKR: total knee replacement (Bilateral); Adiposity; Atrial fibrillation ([name]); Malignant neoplasm of urinary bladder ([place]); Type 2 diabetes mellitus ([place]); Essential (primary) hypertension; Muscle spasticity; Long term current use of anticoagulant; Combined fat and carbohydrate induced hyperlipemia; Pure hypercholesterolemia; History of bladder cancer; History of hip fracture; History of pelvic fracture; History of anticoagulant therapy; Chronic neck pain; Chondrocalcinosis; Radicular pain of shoulder; History of shoulder surgery; Myofascial pain; Musculoskeletal pain; Muscle spasm; Lumbar facet syndrome (Bilateral) (R>L); Lumbar spondylosis; Morbid obesity ([place]); Continuous opioid dependence ([place]); Disturbance of skin sensation; Malignant neoplasm of bladder ([place]);  Smoker; and FH: atrial fibrillation on his problem list.. His primarily concern today is the Shoulder Pain (left) and Joint Pain  Pain Assessment: Self-Reported Pain Score: 1              Reported level is compatible with observation.       Pain Type: Chronic pain Pain Location: Shoulder Pain Orientation: Left Pain Descriptors / Indicators: Aching, Sharp, Stabbing, Shooting Pain Frequency: Constant  The patient comes into the clinics today for pharmacological management of his chronic pain. I last saw this patient on 12/08/2015. The patient  reports that he does not use drugs. His body mass index is 33.45 kg/m. The patient was informed that we will Be Tapering His Opioids down to a Safer Level (Less Than 60 MME/Day). I have informed the patient that we plan on starting this on his next visit. To help with the patient's pain, we plan to return him back for a left-sided suprascapular nerve block under fluoroscopic guidance, with or without sedation depending on his choice. For this procedure will need to stop his Coumadin for 5 days. He indicates that he takes the Coumadin for his chronic atrial fibrillation and he has stopped it in the past for other surgeries. However, we have reminded him that every time he stopped the Coumadin he goes back to having a high risk of possible embolism. He understands this. I also mentioned to him that he has to choice of talking to his cardiologist to see if he can put him on some type of "bridge bolus therapy".  Date of Last Visit: 11/17/15 Service Provided on Last Visit: Med Refill  Controlled Substance Pharmacotherapy Assessment & REMS (Risk Evaluation and Mitigation Strategy)  Analgesic: MS Contin 30 mg every 12 hours (60 mg/day) plus oxycodone/APAP 10/325 every 8 hours (30 mg/day) MME/day: 105 mg/day Pill Count: Bottle labeled Morphine 30 mg # 16/60  Filled  01-19-16. Bottle labeled oxycodone 10/325 mg # 24/90  Filled 01-19-16 Pharmacokinetics: Onset of action  (Liberation/Absorption): Within expected pharmacological parameters Time to Peak effect (Distribution): Timing and results are as within normal expected parameters Duration of action (Metabolism/Excretion): Within normal limits for medication Pharmacodynamics: Analgesic Effect: More than 50% Activity Facilitation: Medication(s) allow patient to sit, stand, walk, and do the basic ADLs Perceived Effectiveness: Described as relatively effective, allowing for increase in activities of daily living (ADL) Side-effects or Adverse reactions: None reported Monitoring: [place] PMP: Online review of the past 66-monthperiod conducted. Compliant with practice rules and regulations Last UDS on record: ToxAssure Select 13  Date Value Ref Range Status  11/17/2015 FINAL  Final    Comment:    ==================================================================== TOXASSURE SELECT 13 (MW) ==================================================================== Test                             Result       Flag       Units Drug Present and Declared for Prescription Verification   Morphine                       2713         EXPECTED   ng/mg creat    Potential sources of large amounts of morphine in the absence of    codeine include administration of morphine or use of heroin.   Oxycodone                      268          EXPECTED   ng/mg creat   Oxymorphone                    1545         EXPECTED   ng/mg creat   Noroxycodone                   791          EXPECTED   ng/mg creat   Noroxymorphone                 310          EXPECTED   ng/mg creat    Sources of oxycodone are scheduled prescription medications.    Oxymorphone, noroxycodone, and noroxymorphone are expected    metabolites of oxycodone. Oxymorphone is also available as a    scheduled prescription medication. ==================================================================== Test                      Result    Flag   Units      Ref Range   Creatinine               92               mg/dL      >=20 ==================================================================== Declared Medications:  The flagging and interpretation on this report are based on the  following declared medications.  Unexpected results may arise from  inaccuracies in the declared medications.  **Note: The testing scope of this panel includes these medications:  Morphine  Oxycodone (Oxycodone Acetaminophen)  **Note: The testing scope of this panel does not include following  reported medications:  Acetaminophen (Oxycodone Acetaminophen)  Carisoprodol  Digoxin  Furosemide  Losartan (Losartan Potassium)  Metformin  Metoprolol  Naloxone  Simvastatin  Warfarin ==================================================================== For clinical consultation, please  call [phone]. ====================================================================    UDS interpretation: Compliant          Medication Assessment Form: Reviewed. Patient indicates being compliant with therapy Treatment compliance: Compliant Risk Assessment: Aberrant Behavior: None observed today Substance Use Disorder (SUD) Risk Level: No change since last visit Risk of opioid abuse or dependence: 0.7-3.0% with doses ? 36 MME/day and 6.1-26% with doses ? 120 MME/day. Opioid Risk Tool (ORT) Score: Total Score: 0 Low Risk for SUD (Score <3) Depression Scale Score: PHQ-2: PHQ-2 Total Score: 0 No depression (0) PHQ-9: PHQ-9 Total Score: 0 No depression (0-4)  Pharmacologic Plan: No change in therapy, at this time  Laboratory Chemistry  Inflammation Markers Lab Results  Component Value Date   ESRSEDRATE 6 11/18/2015   CRP <0.5 11/18/2015    Renal Function Lab Results  Component Value Date   BUN 15 11/18/2015   CREATININE 0.80 11/18/2015   GFRAA >60 11/18/2015   GFRNONAA >60 11/18/2015    Hepatic Function Lab Results  Component Value Date   AST 19 11/18/2015   ALT 17 11/18/2015    ALBUMIN 4.1 11/18/2015    Electrolytes Lab Results  Component Value Date   NA 139 11/18/2015   K 4.3 11/18/2015   CL 103 11/18/2015   CALCIUM 9.3 11/18/2015   MG 1.8 11/18/2015    Pain Modulating Vitamins Lab Results  Component Value Date   25OHVITD1 31 11/18/2015   25OHVITD2 <1.0 11/18/2015   25OHVITD3 30 11/18/2015   VITAMINB12 268 11/18/2015    Coagulation Parameters No results found for: INR, LABPROT, APTT, PLT  Cardiovascular No results found for: BNP, HGB, HCT Note: Lab results reviewed.  Recent Diagnostic Imaging  Dg Cervical Spine Complete Result Date: 11/18/2015 CLINICAL DATA:  Neck pain for years, no recent injury EXAM: CERVICAL SPINE - COMPLETE 4+ VIEW COMPARISON:  None. FINDINGS: The cervical vertebrae are normal alignment. Significant anterior osteophyte formations present from C3-C7. There is mild degenerative disc disease at the C6-7 level where there is slight loss of disc space and spurring present. No prevertebral soft tissue swelling is noted. On oblique views, minimal foraminal narrowing is present at C5-6. The remainder of the foramina are patent. The odontoid process is intact. The lung apices are clear. IMPRESSION: 1. Normal alignment with diffuse anterior osteophyte formation from C3-C7. 2. Mild degenerative disc disease at C6-7. Electronically Signed   By: Ivar Drape M.D.   On: 11/18/2015 10:38   Dg Lumbar Spine Complete W/bend Result Date: 11/18/2015 CLINICAL DATA:  Chronic pain for many years, no recent injury EXAM: LUMBAR SPINE - COMPLETE WITH BENDING VIEWS COMPARISON:  None. FINDINGS: The lumbar vertebrae are in normal alignment. Intervertebral disc spaces appear normal. Anterior osteophyte formation is noted diffusely. No compression deformity is seen. Bilateral hip replacements are noted. Through flexion extension there is very limited range of motion with no malalignment. IMPRESSION: 1. Normal alignment with normal intervertebral disc spaces. Mild  degenerative change for age. 2. Very limited range of motion through flexion and extension. 3. Bilateral total hip replacements. Electronically Signed   By: Ivar Drape M.D.   On: 11/18/2015 09:58   Dg Shoulder Right Result Date: 11/18/2015 CLINICAL DATA:  Chronic bilateral shoulder pain EXAM: RIGHT SHOULDER - 2+ VIEW COMPARISON:  None. FINDINGS: Three views of the right shoulder submitted. No acute fracture or subluxation. Mild degenerative changes acromioclavicular joint. Glenohumeral joint is preserved. IMPRESSION: No acute fracture or subluxation. Mild degenerative changes acromioclavicular joint. Electronically Signed   By: Julien Girt  Pop M.D.   On: 11/18/2015 10:26   Dg Knee 1-2 Views Left Result Date: 11/18/2015 CLINICAL DATA:  Chronic pain. EXAM: LEFT KNEE - 1-2 VIEW COMPARISON:  05/11/2013. FINDINGS: Total left knee replacement. Hardware intact. Small loose bodies cannot be excluded. No acute abnormality. IMPRESSION: Total left knee replacement with good anatomic alignment. Hardware intact . Electronically Signed   By: Marcello Moores  Register   On: 11/18/2015 10:35   Dg Knee 1-2 Views Right Result Date: 11/18/2015 CLINICAL DATA:  Chronic pain.  No prior injury . EXAM: RIGHT KNEE - 1-2 VIEW COMPARISON:  05/11/2013. FINDINGS: Total right knee replacement. Hardware intact. Good anatomic alignment. Peripheral vascular calcification. IMPRESSION: 1. Total right knee replacement.  Good anatomic alignment. 2. Peripheral vascular disease. Electronically Signed   By: Marcello Moores  Register   On: 11/18/2015 10:36   Dg Shoulder Left Result Date: 11/18/2015 CLINICAL DATA:  Chronic bilateral shoulder pain EXAM: LEFT SHOULDER - 2+ VIEW COMPARISON:  None. FINDINGS: Three views of the left shoulder submitted. No acute fracture or subluxation. Mild degenerative changes AC joint. Minimal inferior spurring of glenoid. IMPRESSION: No acute fracture or subluxation. Mild degenerative changes as described above. Electronically Signed    By: Lahoma Crocker M.D.   On: 11/18/2015 10:24   Dg Hip Unilat W Or W/o Pelvis 2-3 Views Left Result Date: 11/18/2015 CLINICAL DATA:  Chronic hip pain, previous hip replacement, pain Management. EXAM: DG HIP (WITH OR WITHOUT PELVIS) 2-3V LEFT; DG HIP (WITH OR WITHOUT PELVIS) 2-3V RIGHT COMPARISON:  Right hip series and AP pelvis dated May 11, 2013 FINDINGS: The bony pelvis is subjectively osteopenic but intact. The bilateral hip arthroplasties appear appropriately positioned. The interfaces of the prostheses with the native bones appear normal. The intertrochanteric and subtrochanteric regions of the femurs are normal. IMPRESSION: There is no acute or significant chronic abnormality of the prosthetic hip joints nor of the native bones. There is subjective diffuse osteopenia. Electronically Signed   By: David  Martinique M.D.   On: 11/18/2015 10:35   Dg Hip Unilat W Or W/o Pelvis 2-3 Views Right Result Date: 11/18/2015 CLINICAL DATA:  Chronic hip pain, previous hip replacement, pain Management. EXAM: DG HIP (WITH OR WITHOUT PELVIS) 2-3V LEFT; DG HIP (WITH OR WITHOUT PELVIS) 2-3V RIGHT COMPARISON:  Right hip series and AP pelvis dated May 11, 2013 FINDINGS: The bony pelvis is subjectively osteopenic but intact. The bilateral hip arthroplasties appear appropriately positioned. The interfaces of the prostheses with the native bones appear normal. The intertrochanteric and subtrochanteric regions of the femurs are normal. IMPRESSION: There is no acute or significant chronic abnormality of the prosthetic hip joints nor of the native bones. There is subjective diffuse osteopenia. Electronically Signed   By: David  Martinique M.D.   On: 11/18/2015 10:35   Note: Today we went over all of his lab work and x-rays. There were explained to the patient on layman's terms and he was given a paper copy of all of his x-rays.  Meds  The patient has a current medication list which includes the following prescription(s):  fifty50 glucose meter 2.0, carisoprodol, digoxin, furosemide, losartan, metformin, metoprolol succinate, morphine, morphine, morphine, naloxone hcl, oxycodone-acetaminophen, oxycodone-acetaminophen, oxycodone-acetaminophen, simvastatin, warfarin, warfarin, and furosemide.  Current Outpatient Prescriptions on File Prior to Visit  Medication Sig   Blood Glucose Monitoring Suppl (FIFTY50 GLUCOSE METER 2.0) w/Device KIT    carisoprodol (SOMA) 350 MG tablet Take 350 mg by mouth 2 (two) times daily.    losartan (COZAAR) 50 MG tablet Take  50 mg by mouth daily.    metFORMIN (GLUCOPHAGE) 500 MG tablet Take 500 mg by mouth daily with breakfast.    metoprolol succinate (TOPROL-XL) 50 MG 24 hr tablet Take 50 mg by mouth.   Naloxone HCl (NARCAN) 4 MG/0.1ML LIQD Place 1 spray into the nose once. Spray half of bottle content into each nostril, then call 911   simvastatin (ZOCOR) 40 MG tablet Take 40 mg by mouth at bedtime.    warfarin (COUMADIN) 4 MG tablet Take 4 mg by mouth daily at 6 PM.    furosemide (LASIX) 20 MG tablet Take 20 mg by mouth as needed.    No current facility-administered medications on file prior to visit.     ROS  Constitutional: Denies any fever or chills Gastrointestinal: No reported hemesis, hematochezia, vomiting, or acute GI distress Musculoskeletal: Denies any acute onset joint swelling, redness, loss of ROM, or weakness Neurological: No reported episodes of acute onset apraxia, aphasia, dysarthria, agnosia, amnesia, paralysis, loss of coordination, or loss of consciousness  Allergies  Aaron Short is allergic to diltiazem; penicillins; and tizanidine.  [place]  Medical:  Aaron Short  has a past medical history of Arthralgia of lower leg (04/02/2012); Arthralgia of upper arm (06/18/2009); Cancer ([place]); Diabetes mellitus without complication ([place]); FH: atrial fibrillation; and Hypertension. Family: family history includes Dementia in his mother; Heart disease in  his father. Surgical:  has a past surgical history that includes bladder cancer surgery (2015); Joint replacement; Replacement total hip w/  resurfacing implants (Bilateral); Total knee arthroplasty (Bilateral); and Shoulder surgery (Bilateral). Tobacco:  reports that he has been smoking Cigarettes.  He has never used smokeless tobacco. Alcohol:  reports that he does not drink alcohol. Drug:  reports that he does not use drugs.  Constitutional Exam  General appearance: Well nourished, well developed, and well hydrated. In no acute distress Vitals:   02/07/16 0809  BP: 129/61  Pulse: (!) 55  Resp: 18  Temp: 97.8 F (36.6 C)  SpO2: 100%  Weight: 220 lb (99.8 kg)  Height: '5\' 8"'  (1.727 m)  BMI Assessment: Estimated body mass index is 33.45 kg/m as calculated from the following:   Height as of this encounter: '5\' 8"'  (1.727 m).   Weight as of this encounter: 220 lb (99.8 kg).   BMI interpretation: (30-34.9 kg/m2) = Obese (Class I): This range is associated with a 68% higher incidence of chronic pain. BMI Readings from Last 4 Encounters:  02/07/16 33.45 kg/m  11/17/15 34.52 kg/m  08/16/15 34.97 kg/m  05/18/15 32.84 kg/m   Wt Readings from Last 4 Encounters:  02/07/16 220 lb (99.8 kg)  11/17/15 227 lb (103 kg)  08/16/15 230 lb (104.3 kg)  05/18/15 216 lb (98 kg)  Psych/Mental status: Alert and oriented x 3 (person, place, & time) Eyes: PERLA Respiratory: No evidence of acute respiratory distress  Cervical Spine Exam  Inspection: No masses, redness, or swelling Alignment: Symmetrical Functional ROM: ROM appears unrestricted Stability: No instability detected Muscle strength & Tone: Functionally intact Sensory: Unimpaired Palpation: Non-contributory  Upper Extremity (UE) Exam    Side: Right upper extremity  Side: Left upper extremity  Inspection: No masses, redness, swelling, or asymmetry  Inspection: No masses, redness, swelling, or asymmetry  Functional ROM: ROM appears  unrestricted          Functional ROM: Decreased ROM for shoulder joint  Muscle strength & Tone: Functionally intact  Muscle strength & Tone: Functionally intact  Sensory: Unimpaired  Sensory: Movement-associated pain  Palpation: Non-contributory  Palpation: Complains of area being tender to palpation   Thoracic Spine Exam  Inspection: No masses, redness, or swelling Alignment: Symmetrical Functional ROM: ROM appears unrestricted Stability: No instability detected Sensory: Unimpaired Muscle strength & Tone: Functionally intact Palpation: Non-contributory  Lumbar Spine Exam  Inspection: No masses, redness, or swelling Alignment: Symmetrical Functional ROM: ROM appears unrestricted Stability: No instability detected Muscle strength & Tone: Functionally intact Sensory: Unimpaired Palpation: Non-contributory Provocative Tests: Lumbar Hyperextension and rotation test: evaluation deferred today       Patrick's Maneuver: evaluation deferred today              Gait & Posture Assessment  Ambulation: Unassisted Gait: Relatively normal for age and body habitus Posture: WNL   Lower Extremity Exam    Side: Right lower extremity  Side: Left lower extremity  Inspection: No masses, redness, swelling, or asymmetry  Inspection: No masses, redness, swelling, or asymmetry  Functional ROM: ROM appears unrestricted          Functional ROM: ROM appears unrestricted          Muscle strength & Tone: Functionally intact  Muscle strength & Tone: Functionally intact  Sensory: Unimpaired  Sensory: Unimpaired  Palpation: Non-contributory  Palpation: Non-contributory    Assessment & Plan  Primary Diagnosis & Pertinent Problem List: The primary encounter diagnosis was Chronic pain. Diagnoses of Long term current use of opiate analgesic, Opiate use (105 MME/Day), Encounter for therapeutic drug level monitoring, Chronic shoulder pain (Location of Primary Source of Pain) (Bilateral) (L>R), Chronic hip pain,  unspecified laterality, Chronic low back pain (Location of Tertiary source of pain) (Bilateral) (R>L), S/P THR: total hip replacement (Bilateral), S/P TKR: total knee replacement (Bilateral), Radicular pain of shoulder, FH: atrial fibrillation, and Chronic atrial fibrillation (HCC) were also pertinent to this visit.  Visit Diagnosis: 1. Chronic pain   2. Long term current use of opiate analgesic   3. Opiate use (105 MME/Day)   4. Encounter for therapeutic drug level monitoring   5. Chronic shoulder pain (Location of Primary Source of Pain) (Bilateral) (L>R)   6. Chronic hip pain, unspecified laterality   7. Chronic low back pain (Location of Tertiary source of pain) (Bilateral) (R>L)   8. S/P THR: total hip replacement (Bilateral)   9. S/P TKR: total knee replacement (Bilateral)   10. Radicular pain of shoulder   11. FH: atrial fibrillation   12. Chronic atrial fibrillation (HCC)     Problems updated and reviewed during this visit: Problem  Atrial Fibrillation (Hcc)   sees by Dr. Newman Pies at [facility] regularly, on warfarin w/stable INRs and digoxin low dose.   Last Assessment & Plan:  INR at goal today at 2. Cont same dose 4 mg (2 mg x 2) on Tue, Thu, Sat; 3 mg (3 mg x 1) all other days r'd warfarin Next INR 4 wks   Smoker   Last Assessment & Plan:  Has decreased smoking to <3 cigarettes per day.  Contemplating quitting.  Pt will consider CT lung cancer screening in the future.    Malignant Neoplasm of Bladder (Hcc)    Problem-specific Plan(s): No problem-specific Assessment & Plan notes found for this encounter.  No new Assessment & Plan notes have been filed under this hospital service since the last note was generated. Service: Pain Management   Plan of Care   Problem List Items Addressed This Visit      High   Chronic hip pain (Location of Secondary source of  pain) (Bilateral) (R>L) (Chronic)   Relevant Medications   morphine (MS CONTIN) 30 MG 12 hr tablet (Start on  02/18/2016)   morphine (MS CONTIN) 30 MG 12 hr tablet (Start on 03/19/2016)   morphine (MS CONTIN) 30 MG 12 hr tablet (Start on 04/18/2016)   oxyCODONE-acetaminophen (PERCOCET) 10-325 MG tablet (Start on 02/18/2016)   oxyCODONE-acetaminophen (PERCOCET) 10-325 MG tablet (Start on 03/19/2016)   oxyCODONE-acetaminophen (PERCOCET) 10-325 MG tablet (Start on 04/18/2016)   Chronic low back pain (Location of Tertiary source of pain) (Bilateral) (R>L) (Chronic)   Relevant Medications   morphine (MS CONTIN) 30 MG 12 hr tablet (Start on 02/18/2016)   morphine (MS CONTIN) 30 MG 12 hr tablet (Start on 03/19/2016)   morphine (MS CONTIN) 30 MG 12 hr tablet (Start on 04/18/2016)   oxyCODONE-acetaminophen (PERCOCET) 10-325 MG tablet (Start on 02/18/2016)   oxyCODONE-acetaminophen (PERCOCET) 10-325 MG tablet (Start on 03/19/2016)   oxyCODONE-acetaminophen (PERCOCET) 10-325 MG tablet (Start on 04/18/2016)   Chronic pain - Primary (Chronic)   Relevant Medications   morphine (MS CONTIN) 30 MG 12 hr tablet (Start on 02/18/2016)   morphine (MS CONTIN) 30 MG 12 hr tablet (Start on 03/19/2016)   morphine (MS CONTIN) 30 MG 12 hr tablet (Start on 04/18/2016)   oxyCODONE-acetaminophen (PERCOCET) 10-325 MG tablet (Start on 02/18/2016)   oxyCODONE-acetaminophen (PERCOCET) 10-325 MG tablet (Start on 03/19/2016)   oxyCODONE-acetaminophen (PERCOCET) 10-325 MG tablet (Start on 04/18/2016)   Chronic shoulder pain (Location of Primary Source of Pain) (Bilateral) (L>R) (Chronic)   Relevant Orders   SUPRASCAPULAR NERVE BLOCK   Radicular pain of shoulder (Chronic)   S/P THR: total hip replacement (Bilateral) (Chronic)   S/P TKR: total knee replacement (Bilateral)     Medium   Encounter for therapeutic drug level monitoring   Long term current use of opiate analgesic (Chronic)   Relevant Orders   ToxASSURE Select 13 (MW), Urine   Opiate use (105 MME/Day) (Chronic)     Low   Atrial fibrillation (HCC) (Chronic)   Relevant  Medications   digoxin (DIGOX) 0.125 MG tablet   furosemide (LASIX) 20 MG tablet   warfarin (COUMADIN) 4 MG tablet     Unprioritized   FH: atrial fibrillation    Other Visit Diagnoses   None.      Pharmacotherapy (Medications Ordered): Meds ordered this encounter  Medications   morphine (MS CONTIN) 30 MG 12 hr tablet    Sig: Take 1 tablet (30 mg total) by mouth every 12 (twelve) hours.    Dispense:  60 tablet    Refill:  0    Do not place this medication, or any other prescription from our practice, on "Automatic Refill". Patient may have prescription filled one day early if pharmacy is closed on scheduled refill date. Do not fill until: 02/18/16 To last until: 03/19/16   morphine (MS CONTIN) 30 MG 12 hr tablet    Sig: Take 1 tablet (30 mg total) by mouth every 12 (twelve) hours.    Dispense:  60 tablet    Refill:  0    Do not place this medication, or any other prescription from our practice, on "Automatic Refill". Patient may have prescription filled one day early if pharmacy is closed on scheduled refill date. Do not fill until: 03/19/16 To last until: 04/18/16   morphine (MS CONTIN) 30 MG 12 hr tablet    Sig: Take 1 tablet (30 mg total) by mouth every 12 (twelve) hours.    Dispense:  60  tablet    Refill:  0    Do not place this medication, or any other prescription from our practice, on "Automatic Refill". Patient may have prescription filled one day early if pharmacy is closed on scheduled refill date. Do not fill until: 04/18/16 To last until: 05/18/16   oxyCODONE-acetaminophen (PERCOCET) 10-325 MG tablet    Sig: Take 1 tablet by mouth every 8 (eight) hours as needed for pain.    Dispense:  90 tablet    Refill:  0    Do not place this medication, or any other prescription from our practice, on "Automatic Refill". Patient may have prescription filled one day early if pharmacy is closed on scheduled refill date. Do not fill until: 02/18/16 To last until: 03/19/16    oxyCODONE-acetaminophen (PERCOCET) 10-325 MG tablet    Sig: Take 1 tablet by mouth every 8 (eight) hours as needed for pain.    Dispense:  90 tablet    Refill:  0    Do not place this medication, or any other prescription from our practice, on "Automatic Refill". Patient may have prescription filled one day early if pharmacy is closed on scheduled refill date. Do not fill until: 03/19/16 To last until: 04/18/16   oxyCODONE-acetaminophen (PERCOCET) 10-325 MG tablet    Sig: Take 1 tablet by mouth every 8 (eight) hours as needed for pain.    Dispense:  90 tablet    Refill:  0    Do not place this medication, or any other prescription from our practice, on "Automatic Refill". Patient may have prescription filled one day early if pharmacy is closed on scheduled refill date. Do not fill until: 04/18/16 To last until: 05/18/16    [facility] Procedure Ordered: Orders Placed This Encounter  Procedures   SUPRASCAPULAR NERVE BLOCK   ToxASSURE Select 13 (MW), Urine    Imaging Ordered: None  Interventional Therapies: Scheduled:  Diagnostic left suprascapular nerve block under fluoroscopic guidance, with or without sedation. Stop Coumadin for 5 days prior to procedure.    Considering:  (Stop Coumadin for 5 days prior to procedure) Suprascapular nerve block  Possible suprascapular nerve radiofrequency ablation    PRN Procedures:  None at this time.    Referral(s) or Consult(s): None at this time.  New Prescriptions   No medications on file    Medications administered during this visit: Aaron Short had no medications administered during this visit.  Requested PM Follow-up: Return in 3 months (on 05/08/2016) for Med-Mgmt, In addition, Schedule Procedure, (ASAA).  Future Appointments Date Time Provider [place]  05/08/2016 10:00 AM Milinda Pointer, MD [facility] None    Primary Care Physician: Neomia Dear, MD Location: [facility]  Facility Note by: Kathlen Brunswick. Dossie Arbour, M.D, DABA, DABAPM, DABPM, DABIPP, FIPP  Pain Score Disclaimer: We use the NRS-11 scale. This is a self-reported, subjective measurement of pain severity with only modest accuracy. It is used primarily to identify changes within a particular patient. It must be understood that outpatient pain scales are significantly less accurate that those used for research, where they can be applied under ideal controlled circumstances with minimal exposure to variables. In reality, the score is likely to be a combination of pain intensity and pain affect, where pain affect describes the degree of emotional arousal or changes in action readiness caused by the sensory experience of pain. Factors such as social and work situation, setting, emotional state, anxiety levels, expectation, and prior pain experience may influence pain perception and show large  inter-individual differences that may also be affected by time variables.  Patient instructions provided during this appointment: Patient Instructions   Smoking Cessation, Tips for Success If you are ready to quit smoking, congratulations! You have chosen to help yourself be healthier. Cigarettes bring nicotine, tar, carbon monoxide, and other irritants into your body. Your lungs, heart, and blood vessels will be able to work better without these poisons. There are many different ways to quit smoking. Nicotine gum, nicotine patches, a nicotine inhaler, or nicotine nasal spray can help with physical craving. Hypnosis, support groups, and medicines help break the habit of smoking. WHAT THINGS CAN I DO TO MAKE QUITTING EASIER?  Here are some tips to help you quit for good:  Pick a date when you will quit smoking completely. Tell all of your friends and family about your plan to quit on that date.  Do not try to slowly cut down on the number of cigarettes you are smoking. Pick a quit date and quit smoking completely starting on  that day.  Throw away all cigarettes.   Clean and remove all ashtrays from your home, work, and car.  On a card, write down your reasons for quitting. Carry the card with you and read it when you get the urge to smoke.  Cleanse your body of nicotine. Drink enough water and fluids to keep your urine clear or pale yellow. Do this after quitting to flush the nicotine from your body.  Learn to predict your moods. Do not let a bad situation be your excuse to have a cigarette. Some situations in your life might tempt you into wanting a cigarette.  Never have "just one" cigarette. It leads to wanting another and another. Remind yourself of your decision to quit.  Change habits associated with smoking. If you smoked while driving or when feeling stressed, try other activities to replace smoking. Stand up when drinking your coffee. Brush your teeth after eating. Sit in a different chair when you read the paper. Avoid alcohol while trying to quit, and try to drink fewer caffeinated beverages. Alcohol and caffeine may urge you to smoke.  Avoid foods and drinks that can trigger a desire to smoke, such as sugary or spicy foods and alcohol.  Ask people who smoke not to smoke around you.  Have something planned to do right after eating or having a cup of coffee. For example, plan to take a walk or exercise.  Try a relaxation exercise to calm you down and decrease your stress. Remember, you may be tense and nervous for the first 2 weeks after you quit, but this will pass.  Find new activities to keep your hands busy. Play with a pen, coin, or rubber band. Doodle or draw things on paper.  Brush your teeth right after eating. This will help cut down on the craving for the taste of tobacco after meals. You can also try mouthwash.   Use oral substitutes in place of cigarettes. Try using lemon drops, carrots, cinnamon sticks, or chewing gum. Keep them handy so they are available when you have the urge to  smoke.  When you have the urge to smoke, try deep breathing.  Designate your home as a nonsmoking area.  If you are a heavy smoker, ask your health care provider about a prescription for nicotine chewing gum. It can ease your withdrawal from nicotine.  Reward yourself. Set aside the cigarette money you save and buy yourself something nice.  Look for support from  others. Join a support group or smoking cessation program. Ask someone at home or at work to help you with your plan to quit smoking.  Always ask yourself, "Do I need this cigarette or is this just a reflex?" Tell yourself, "Today, I choose not to smoke," or "I do not want to smoke." You are reminding yourself of your decision to quit.  Do not replace cigarette smoking with electronic cigarettes (commonly called e-cigarettes). The safety of e-cigarettes is unknown, and some may contain harmful chemicals.  If you relapse, do not give up! Plan ahead and think about what you will do the next time you get the urge to smoke. HOW WILL I FEEL WHEN I QUIT SMOKING? You may have symptoms of withdrawal because your body is used to nicotine (the addictive substance in cigarettes). You may crave cigarettes, be irritable, feel very hungry, cough often, get headaches, or have difficulty concentrating. The withdrawal symptoms are only temporary. They are strongest when you first quit but will go away within 10-14 days. When withdrawal symptoms occur, stay in control. Think about your reasons for quitting. Remind yourself that these are signs that your body is healing and getting used to being without cigarettes. Remember that withdrawal symptoms are easier to treat than the major diseases that smoking can cause.  Even after the withdrawal is over, expect periodic urges to smoke. However, these cravings are generally short lived and will go away whether you smoke or not. Do not smoke! WHAT RESOURCES ARE AVAILABLE TO HELP ME QUIT SMOKING? Your health care  provider can direct you to community resources or hospitals for support, which may include:  Group support.  Education.  Hypnosis.  Therapy.   This information is not intended to replace advice given to you by your health care provider. Make sure you discuss any questions you have with your health care provider.   Document Released: 02/11/2004 Document Revised: 06/05/2014 Document Reviewed: 10/31/2012 Elsevier Interactive Patient Education 2016 [employer].  Trigger Point Injection Trigger points are areas where you have muscle pain. A trigger point injection is a shot given in the trigger point to relieve that pain. A trigger point might feel like a knot in your muscle. It hurts to press on a trigger point. Sometimes the pain spreads out (radiates) to other parts of the body. For example, pressing on a trigger point in your shoulder might cause pain in your arm or neck. You might have one trigger point. Or, you might have more than one. People often have trigger points in their upper back and lower back. They also occur often in the neck and shoulders. Pain from a trigger point lasts for a long time. It can make it hard to keep moving. You might not be able to do the exercise or physical therapy that could help you deal with the pain. A trigger point injection may help. It does not work for everyone. But, it may relieve your pain for a few days or a few months. A trigger point injection does not cure long-lasting (chronic) pain. LET YOUR CAREGIVER KNOW ABOUT:  Any allergies (especially to latex, lidocaine, or steroids).  Blood-thinning medicines that you take. These drugs can lead to bleeding or bruising after an injection. They include:  Aspirin.  Ibuprofen.  Clopidogrel.  Warfarin.  Other medicines you take. This includes all vitamins, herbs, eyedrops, over-the-counter medicines, and creams.  Use of steroids.  Recent infections.  Past problems with numbing  medicines.  Bleeding problems.  Surgeries you have had.  Other health problems. RISKS AND COMPLICATIONS A trigger point injection is a safe treatment. However, problems may develop, such as:  Minor side effects usually go away in 1 to 2 days. These may include:  Soreness.  Bruising.  Stiffness.  More serious problems are rare. But, they may include:  Bleeding under the skin (hematoma).  Skin infection.  Breaking off of the needle under your skin.  Lung puncture.  The trigger point injection may not work for you. BEFORE THE PROCEDURE You may need to stop taking any medicine that thins your blood. This is to prevent bleeding and bruising. Usually these medicines are stopped several days before the injection. No other preparation is needed. PROCEDURE  A trigger point injection can be given in your caregiver's office or in a clinic. Each injection takes 2 minutes or less.  Your caregiver will feel for trigger points. The caregiver may use a marker to circle the area for the injection.  The skin over the trigger point will be washed with a germ-killing (antiseptic) solution.  The caregiver pinches the spot for the injection.  Then, a very thin needle is used for the shot. You may feel pain or a twitching feeling when the needle enters the trigger point.  A numbing solution may be injected into the trigger point. Sometimes a drug to keep down swelling, redness, and warmth (inflammation) is also injected.  Your caregiver moves the needle around the trigger zone until the tightness and twitching goes away.  After the injection, your caregiver may put gentle pressure over the injection site.  Then it is covered with a bandage. AFTER THE PROCEDURE  You can go right home after the injection.  The bandage can be taken off after a few hours.  You may feel sore and stiff for 1 to 2 days.  Go back to your regular activities slowly. Your caregiver may ask you to stretch your  muscles. Do not do anything that takes extra energy for a few days.  Follow your caregiver's instructions to manage and treat other pain.   This information is not intended to replace advice given to you by your health care provider. Make sure you discuss any questions you have with your health care provider.   Document Released: 05/04/2011 Document Revised: 09/09/2012 Document Reviewed: 05/04/2011 Elsevier Interactive Patient Education 2016 [place]  What are the risk, side effects and possible complications? Generally speaking, most procedures are safe.  However, with any procedure there are risks, side effects, and the possibility of complications.  The risks and complications are dependent upon the sites that are lesioned, or the type of nerve block to be performed.  The closer the procedure is to the spine, the more serious the risks are.  Great care is taken when placing the radio frequency needles, block needles or lesioning probes, but sometimes complications can occur. 1. Infection: Any time there is an injection through the skin, there is a risk of infection.  This is why sterile conditions are used for these blocks.  There are four possible types of infection. 1. Localized skin infection. 2. Central Nervous System Infection-This can be in the form of Meningitis, which can be deadly. 3. Epidural Infections-This can be in the form of an epidural abscess, which can cause pressure inside of the spine, causing compression of the spinal cord with subsequent paralysis. This would require an emergency surgery to decompress, and there are no guarantees that  the patient would recover from the paralysis. 4. Discitis-This is an infection of the intervertebral discs.  It occurs in about 1% of discography procedures.  It is difficult to treat and it may lead to surgery.        2. Pain: the needles have to go through skin and soft tissues, will cause soreness.        3. Damage to internal structures:  The nerves to be lesioned may be near blood vessels or    other nerves which can be potentially damaged.       4. Bleeding: Bleeding is more common if the patient is taking blood thinners such as  aspirin, Coumadin, Ticiid, Plavix, etc., or if he/she have some genetic predisposition  such as hemophilia. Bleeding into the spinal canal can cause compression of the spinal  cord with subsequent paralysis.  This would require an emergency surgery to  decompress and there are no guarantees that the patient would recover from the  paralysis.       5. Pneumothorax:  Puncturing of a lung is a possibility, every time a needle is introduced in  the area of the chest or upper back.  Pneumothorax refers to free air around the  collapsed lung(s), inside of the thoracic cavity (chest cavity).  Another two possible  complications related to a similar event would include: Hemothorax and Chylothorax.   These are variations of the Pneumothorax, where instead of air around the collapsed  lung(s), you may have blood or chyle, respectively.       6. Spinal headaches: They may occur with any procedures in the area of the spine.       7. Persistent CSF (Cerebro-Spinal Fluid) leakage: This is a rare problem, but may occur  with prolonged intrathecal or epidural catheters either due to the formation of a fistulous  track or a dural tear.       8. Nerve damage: By working so close to the spinal cord, there is always a possibility of  nerve damage, which could be as serious as a permanent spinal cord injury with  paralysis.       9. Death:  Although rare, severe deadly allergic reactions known as "Anaphylactic  reaction" can occur to any of the medications used.      10. Worsening of the symptoms:  We can always make thing worse.  What are the chances of something like this happening? Chances of any of this occuring are extremely low.  By statistics, you have more of a chance of getting killed  in a motor vehicle accident: while driving to the hospital than any of the above occurring .  Nevertheless, you should be aware that they are possibilities.  In general, it is similar to taking a shower.  Everybody knows that you can slip, hit your head and get killed.  Does that mean that you should not shower again?  Nevertheless always keep in mind that statistics do not mean anything if you happen to be on the wrong side of them.  Even if a procedure has a 1 (one) in a 1,000,000 (million) chance of going wrong, it you happen to be that one..Also, keep in mind that by statistics, you have more of a chance of having something go wrong when taking medications.  Who should not have this procedure? If you are on a blood thinning medication (e.g. Coumadin, Plavix, see list of "Blood Thinners"), or if you have an active infection going on, you  should not have the procedure.  If you are taking any blood thinners, please inform your physician.  How should I prepare for this procedure?  Do not eat or drink anything at least six hours prior to the procedure.  Bring a driver with you .  It cannot be a taxi.  Come accompanied by an adult that can drive you back, and that is strong enough to help you if your legs get weak or numb from the local anesthetic.  Take all of your medicines the morning of the procedure with just enough water to swallow them.  If you have diabetes, make sure that you are scheduled to have your procedure done first thing in the morning, whenever possible.  If you have diabetes, take only half of your insulin dose and notify our nurse that you have done so as soon as you arrive at the clinic.  If you are diabetic, but only take blood sugar pills (oral hypoglycemic), then do not take them on the morning of your procedure.  You may take them after you have had the procedure.  Do not take aspirin or any aspirin-containing medications, at least eleven (11) days prior to the procedure.   They may prolong bleeding.  Wear loose fitting clothing that may be easy to take off and that you would not mind if it got stained with Betadine or blood.  Do not wear any jewelry or perfume  Remove any nail coloring.  It will interfere with some of our monitoring equipment.  NOTE: Remember that this is not meant to be interpreted as a complete list of all possible complications.  Unforeseen problems may occur.  BLOOD THINNERS The following drugs contain aspirin or other products, which can cause increased bleeding during surgery and should not be taken for 2 weeks prior to and 1 week after surgery.  If you should need take something for relief of minor pain, you may take acetaminophen which is found in Tylenol,m Datril, Anacin-3 and Panadol. It is not blood thinner. The products listed below are.  Do not take any of the products listed below in addition to any listed on your instruction sheet.  A.P.C or A.P.C with Codeine Codeine Phosphate Capsules #3 Ibuprofen Ridaura  ABC compound Congesprin Imuran rimadil  Advil Cope Indocin Robaxisal  Alka-Seltzer Effervescent Pain Reliever and Antacid Coricidin or Coricidin-D  Indomethacin Rufen  Alka-Seltzer plus Cold Medicine Cosprin Ketoprofen S-A-C Tablets  Anacin Analgesic Tablets or Capsules Coumadin Korlgesic Salflex  Anacin Extra Strength Analgesic tablets or capsules CP-2 Tablets Lanoril Salicylate  Anaprox Cuprimine Capsules Levenox Salocol  Anexsia-D Dalteparin Magan Salsalate  Anodynos Darvon compound Magnesium Salicylate Sine-off  Ansaid Dasin Capsules Magsal Sodium Salicylate  Anturane Depen Capsules Marnal Soma  APF Arthritis pain formula Dewitt's Pills Measurin Stanback  Argesic Dia-Gesic Meclofenamic Sulfinpyrazone  Arthritis Bayer Timed Release Aspirin Diclofenac Meclomen Sulindac  Arthritis pain formula Anacin Dicumarol Medipren Supac  Analgesic (Safety coated) Arthralgen Diffunasal Mefanamic Suprofen  Arthritis Strength  Bufferin Dihydrocodeine Mepro Compound Suprol  Arthropan liquid Dopirydamole Methcarbomol with Aspirin Synalgos  ASA tablets/Enseals Disalcid Micrainin Tagament  Ascriptin Doan's Midol Talwin  Ascriptin A/D Dolene Mobidin Tanderil  Ascriptin Extra Strength Dolobid Moblgesic Ticlid  Ascriptin with Codeine Doloprin or Doloprin with Codeine Momentum Tolectin  Asperbuf Duoprin Mono-gesic Trendar  Aspergum Duradyne Motrin or Motrin IB Triminicin  Aspirin plain, buffered or enteric coated Durasal Myochrisine Trigesic  Aspirin Suppositories Easprin Nalfon Trillsate  Aspirin with Codeine Ecotrin Regular or Extra Strength Naprosyn Uracel  Atromid-S Efficin Naproxen Ursinus  Auranofin Capsules Elmiron Neocylate Vanquish  Axotal Emagrin Norgesic Verin  Azathioprine Empirin or Empirin with Codeine Normiflo Vitamin E  Azolid Emprazil Nuprin Voltaren  Bayer Aspirin plain, buffered or children's or timed BC Tablets or powders Encaprin Orgaran Warfarin Sodium  Buff-a-Comp Enoxaparin Orudis Zorpin  Buff-a-Comp with Codeine Equegesic Os-Cal-Gesic   Buffaprin Excedrin plain, buffered or Extra Strength Oxalid   Bufferin Arthritis Strength Feldene Oxphenbutazone   Bufferin plain or Extra Strength Feldene Capsules Oxycodone with Aspirin   Bufferin with Codeine Fenoprofen Fenoprofen Pabalate or Pabalate-SF   Buffets II Flogesic Panagesic   Buffinol plain or Extra Strength Florinal or Florinal with Codeine Panwarfarin   Buf-Tabs Flurbiprofen Penicillamine   Butalbital Compound Four-way cold tablets Penicillin   Butazolidin Fragmin Pepto-Bismol   Carbenicillin Geminisyn Percodan   Carna Arthritis Reliever Geopen Persantine   Carprofen Gold's salt Persistin   Chloramphenicol Goody's Phenylbutazone   Chloromycetin Haltrain Piroxlcam   Clmetidine heparin Plaquenil   Cllnoril Hyco-pap Ponstel   Clofibrate Hydroxy chloroquine Propoxyphen         Before stopping any of these medications, be sure to consult  the physician who ordered them.  Some, such as Coumadin (Warfarin) are ordered to prevent or treat serious conditions such as "deep thrombosis", "pumonary embolisms", and other heart problems.  The amount of time that you may need off of the medication may also vary with the medication and the reason for which you were taking it.  If you are taking any of these medications, please make sure you notify your pain physician before you undergo any procedures.         Drug Holidays  Definitions Tolerance:  Defined as the progressively decreased responsiveness to a drug.  Occurs when the drug is used repeatedly and the body adapts to the continued presence of the drug.  As a result, a larger dose of the drug is needed to achieve the effect originally obtained by a smaller dose.  It is thought to be due to the formation of excess opioid receptors.  Drug Holiday:   Is when a patient stops taking a medication(s) for a period of time; anywhere from a few days to several weeks.  Withdrawals:   Refers to the wide range of symptoms that occur after stopping or dramatically reducing opiate drugs after heavy or prolonged use.  Withdrawal symptoms do not occur to patients that use low dose opioids, or those who take the medication sporadically.  Contrary to benzodiazepine (example: Valium, Xanax, etc.) or alcohol withdrawals ("Delirium Tremens"), opioid withdrawals are not lethal.  Withdrawals are the physical manifestation of the body getting rid of the excess receptors.  Purpose To eliminate tolerance.  Duration of Holiday 14 consecutive days. (2 weeks)  Expected Symptoms Early symptoms of withdrawal include:   Agitation  Anxiety  Muscle Aches   Increased tearing  Insomnia  Runny Nose   Sweating  Yawning     Late symptoms of withdrawal include:   Abdominal cramping  Diarrhea  Dilated pupils   Goose bumps  Nausea  Vomiting   Opioid withdrawal reactions are very uncomfortable but  are not life-threatening.  Symptoms usually start within 12 hours of last opioid dose and within 30 hours of last methadone exposure.  Duration of Symptoms 48-72 hours of short acting medications and 2 to 4 days for methadone.  Treatment  Clonidine (Catepres) or tizanidine (Zanaflex) for agitation, sweating, tearing, runny nose.  Promethazine (Phenergan) for nausea, vomiting.  NSAIDs for  pain.  Benefits 12. Improved effectiveness of opioids. 13. Decreased opioid dose needed to achieve benefits. 14. Improved pain with lesser dose.

## 2016-02-07 NOTE — Patient Instructions (Addendum)
Smoking Cessation, Tips for Success If you are ready to quit smoking, congratulations! You have chosen to help yourself be healthier. Cigarettes bring nicotine, tar, carbon monoxide, and other irritants into your body. Your lungs, heart, and blood vessels will be able to work better without these poisons. There are many different ways to quit smoking. Nicotine gum, nicotine patches, a nicotine inhaler, or nicotine nasal spray can help with physical craving. Hypnosis, support groups, and medicines help break the habit of smoking. WHAT THINGS CAN I DO TO MAKE QUITTING EASIER?  Here are some tips to help you quit for good:  Pick a date when you will quit smoking completely. Tell all of your friends and family about your plan to quit on that date.  Do not try to slowly cut down on the number of cigarettes you are smoking. Pick a quit date and quit smoking completely starting on that day.  Throw away all cigarettes.   Clean and remove all ashtrays from your home, work, and car.  On a card, write down your reasons for quitting. Carry the card with you and read it when you get the urge to smoke.  Cleanse your body of nicotine. Drink enough water and fluids to keep your urine clear or pale yellow. Do this after quitting to flush the nicotine from your body.  Learn to predict your moods. Do not let a bad situation be your excuse to have a cigarette. Some situations in your life might tempt you into wanting a cigarette.  Never have "just one" cigarette. It leads to wanting another and another. Remind yourself of your decision to quit.  Change habits associated with smoking. If you smoked while driving or when feeling stressed, try other activities to replace smoking. Stand up when drinking your coffee. Brush your teeth after eating. Sit in a different chair when you read the paper. Avoid alcohol while trying to quit, and try to drink fewer caffeinated beverages. Alcohol and caffeine may urge you to  smoke.  Avoid foods and drinks that can trigger a desire to smoke, such as sugary or spicy foods and alcohol.  Ask people who smoke not to smoke around you.  Have something planned to do right after eating or having a cup of coffee. For example, plan to take a walk or exercise.  Try a relaxation exercise to calm you down and decrease your stress. Remember, you may be tense and nervous for the first 2 weeks after you quit, but this will pass.  Find new activities to keep your hands busy. Play with a pen, coin, or rubber band. Doodle or draw things on paper.  Brush your teeth right after eating. This will help cut down on the craving for the taste of tobacco after meals. You can also try mouthwash.   Use oral substitutes in place of cigarettes. Try using lemon drops, carrots, cinnamon sticks, or chewing gum. Keep them handy so they are available when you have the urge to smoke.  When you have the urge to smoke, try deep breathing.  Designate your home as a nonsmoking area.  If you are a heavy smoker, ask your health care provider about a prescription for nicotine chewing gum. It can ease your withdrawal from nicotine.  Reward yourself. Set aside the cigarette money you save and buy yourself something nice.  Look for support from others. Join a support group or smoking cessation program. Ask someone at home or at work to help you with your plan  to quit smoking.  Always ask yourself, "Do I need this cigarette or is this just a reflex?" Tell yourself, "Today, I choose not to smoke," or "I do not want to smoke." You are reminding yourself of your decision to quit.  Do not replace cigarette smoking with electronic cigarettes (commonly called e-cigarettes). The safety of e-cigarettes is unknown, and some may contain harmful chemicals.  If you relapse, do not give up! Plan ahead and think about what you will do the next time you get the urge to smoke. HOW WILL I FEEL WHEN I QUIT SMOKING? You  may have symptoms of withdrawal because your body is used to nicotine (the addictive substance in cigarettes). You may crave cigarettes, be irritable, feel very hungry, cough often, get headaches, or have difficulty concentrating. The withdrawal symptoms are only temporary. They are strongest when you first quit but will go away within 10-14 days. When withdrawal symptoms occur, stay in control. Think about your reasons for quitting. Remind yourself that these are signs that your body is healing and getting used to being without cigarettes. Remember that withdrawal symptoms are easier to treat than the major diseases that smoking can cause.  Even after the withdrawal is over, expect periodic urges to smoke. However, these cravings are generally short lived and will go away whether you smoke or not. Do not smoke! WHAT RESOURCES ARE AVAILABLE TO HELP ME QUIT SMOKING? Your health care provider can direct you to community resources or hospitals for support, which may include:  Group support.  Education.  Hypnosis.  Therapy.   This information is not intended to replace advice given to you by your health care provider. Make sure you discuss any questions you have with your health care provider.   Document Released: 02/11/2004 Document Revised: 06/05/2014 Document Reviewed: 10/31/2012 Elsevier Interactive Patient Education 2016 [employer].  Trigger Point Injection Trigger points are areas where you have muscle pain. A trigger point injection is a shot given in the trigger point to relieve that pain. A trigger point might feel like a knot in your muscle. It hurts to press on a trigger point. Sometimes the pain spreads out (radiates) to other parts of the body. For example, pressing on a trigger point in your shoulder might cause pain in your arm or neck. You might have one trigger point. Or, you might have more than one. People often have trigger points in their upper back and lower back. They also  occur often in the neck and shoulders. Pain from a trigger point lasts for a long time. It can make it hard to keep moving. You might not be able to do the exercise or physical therapy that could help you deal with the pain. A trigger point injection may help. It does not work for everyone. But, it may relieve your pain for a few days or a few months. A trigger point injection does not cure long-lasting (chronic) pain. LET YOUR CAREGIVER KNOW ABOUT:  Any allergies (especially to latex, lidocaine, or steroids).  Blood-thinning medicines that you take. These drugs can lead to bleeding or bruising after an injection. They include:  Aspirin.  Ibuprofen.  Clopidogrel.  Warfarin.  Other medicines you take. This includes all vitamins, herbs, eyedrops, over-the-counter medicines, and creams.  Use of steroids.  Recent infections.  Past problems with numbing medicines.  Bleeding problems.  Surgeries you have had.  Other health problems. RISKS AND COMPLICATIONS A trigger point injection is a safe treatment. However, problems may  develop, such as:  Minor side effects usually go away in 1 to 2 days. These may include:  Soreness.  Bruising.  Stiffness.  More serious problems are rare. But, they may include:  Bleeding under the skin (hematoma).  Skin infection.  Breaking off of the needle under your skin.  Lung puncture.  The trigger point injection may not work for you. BEFORE THE PROCEDURE You may need to stop taking any medicine that thins your blood. This is to prevent bleeding and bruising. Usually these medicines are stopped several days before the injection. No other preparation is needed. PROCEDURE  A trigger point injection can be given in your caregiver's office or in a clinic. Each injection takes 2 minutes or less.  Your caregiver will feel for trigger points. The caregiver may use a marker to circle the area for the injection.  The skin over the trigger point  will be washed with a germ-killing (antiseptic) solution.  The caregiver pinches the spot for the injection.  Then, a very thin needle is used for the shot. You may feel pain or a twitching feeling when the needle enters the trigger point.  A numbing solution may be injected into the trigger point. Sometimes a drug to keep down swelling, redness, and warmth (inflammation) is also injected.  Your caregiver moves the needle around the trigger zone until the tightness and twitching goes away.  After the injection, your caregiver may put gentle pressure over the injection site.  Then it is covered with a bandage. AFTER THE PROCEDURE  You can go right home after the injection.  The bandage can be taken off after a few hours.  You may feel sore and stiff for 1 to 2 days.  Go back to your regular activities slowly. Your caregiver may ask you to stretch your muscles. Do not do anything that takes extra energy for a few days.  Follow your caregiver's instructions to manage and treat other pain.   This information is not intended to replace advice given to you by your health care provider. Make sure you discuss any questions you have with your health care provider.   Document Released: 05/04/2011 Document Revised: 09/09/2012 Document Reviewed: 05/04/2011 Elsevier Interactive Patient Education 2016 [employer] GENERAL RISKS AND COMPLICATIONS  What are the risk, side effects and possible complications? Generally speaking, most procedures are safe.  However, with any procedure there are risks, side effects, and the possibility of complications.  The risks and complications are dependent upon the sites that are lesioned, or the type of nerve block to be performed.  The closer the procedure is to the spine, the more serious the risks are.  Great care is taken when placing the radio frequency needles, block needles or lesioning probes, but sometimes complications can occur. 1. Infection: Any  time there is an injection through the skin, there is a risk of infection.  This is why sterile conditions are used for these blocks.  There are four possible types of infection. 1. Localized skin infection. 2. Central Nervous System Infection-This can be in the form of Meningitis, which can be deadly. 3. Epidural Infections-This can be in the form of an epidural abscess, which can cause pressure inside of the spine, causing compression of the spinal cord with subsequent paralysis. This would require an emergency surgery to decompress, and there are no guarantees that the patient would recover from the paralysis. 4. Discitis-This is an infection of the intervertebral discs.  It occurs in about  1% of discography procedures.  It is difficult to treat and it may lead to surgery.        2. Pain: the needles have to go through skin and soft tissues, will cause soreness.       3. Damage to internal structures:  The nerves to be lesioned may be near blood vessels or    other nerves which can be potentially damaged.       4. Bleeding: Bleeding is more common if the patient is taking blood thinners such as  aspirin, Coumadin, Ticiid, Plavix, etc., or if he/she have some genetic predisposition  such as hemophilia. Bleeding into the spinal canal can cause compression of the spinal  cord with subsequent paralysis.  This would require an emergency surgery to  decompress and there are no guarantees that the patient would recover from the  paralysis.       5. Pneumothorax:  Puncturing of a lung is a possibility, every time a needle is introduced in  the area of the chest or upper back.  Pneumothorax refers to free air around the  collapsed lung(s), inside of the thoracic cavity (chest cavity).  Another two possible  complications related to a similar event would include: Hemothorax and Chylothorax.   These are variations of the Pneumothorax, where instead of air around the collapsed  lung(s), you may have blood or chyle,  respectively.       6. Spinal headaches: They may occur with any procedures in the area of the spine.       7. Persistent CSF (Cerebro-Spinal Fluid) leakage: This is a rare problem, but may occur  with prolonged intrathecal or epidural catheters either due to the formation of a fistulous  track or a dural tear.       8. Nerve damage: By working so close to the spinal cord, there is always a possibility of  nerve damage, which could be as serious as a permanent spinal cord injury with  paralysis.       9. Death:  Although rare, severe deadly allergic reactions known as "Anaphylactic  reaction" can occur to any of the medications used.      10. Worsening of the symptoms:  We can always make thing worse.  What are the chances of something like this happening? Chances of any of this occuring are extremely low.  By statistics, you have more of a chance of getting killed in a motor vehicle accident: while driving to the hospital than any of the above occurring .  Nevertheless, you should be aware that they are possibilities.  In general, it is similar to taking a shower.  Everybody knows that you can slip, hit your head and get killed.  Does that mean that you should not shower again?  Nevertheless always keep in mind that statistics do not mean anything if you happen to be on the wrong side of them.  Even if a procedure has a 1 (one) in a 1,000,000 (million) chance of going wrong, it you happen to be that one..Also, keep in mind that by statistics, you have more of a chance of having something go wrong when taking medications.  Who should not have this procedure? If you are on a blood thinning medication (e.g. Coumadin, Plavix, see list of "Blood Thinners"), or if you have an active infection going on, you should not have the procedure.  If you are taking any blood thinners, please inform your physician.  How should I prepare  for this procedure?  Do not eat or drink anything at least six hours prior to the  procedure.  Bring a driver with you .  It cannot be a taxi.  Come accompanied by an adult that can drive you back, and that is strong enough to help you if your legs get weak or numb from the local anesthetic.  Take all of your medicines the morning of the procedure with just enough water to swallow them.  If you have diabetes, make sure that you are scheduled to have your procedure done first thing in the morning, whenever possible.  If you have diabetes, take only half of your insulin dose and notify our nurse that you have done so as soon as you arrive at the clinic.  If you are diabetic, but only take blood sugar pills (oral hypoglycemic), then do not take them on the morning of your procedure.  You may take them after you have had the procedure.  Do not take aspirin or any aspirin-containing medications, at least eleven (11) days prior to the procedure.  They may prolong bleeding.  Wear loose fitting clothing that may be easy to take off and that you would not mind if it got stained with Betadine or blood.  Do not wear any jewelry or perfume  Remove any nail coloring.  It will interfere with some of our monitoring equipment.  NOTE: Remember that this is not meant to be interpreted as a complete list of all possible complications.  Unforeseen problems may occur.  BLOOD THINNERS The following drugs contain aspirin or other products, which can cause increased bleeding during surgery and should not be taken for 2 weeks prior to and 1 week after surgery.  If you should need take something for relief of minor pain, you may take acetaminophen which is found in Tylenol,m Datril, Anacin-3 and Panadol. It is not blood thinner. The products listed below are.  Do not take any of the products listed below in addition to any listed on your instruction sheet.  A.P.C or A.P.C with Codeine Codeine Phosphate Capsules #3 Ibuprofen Ridaura  ABC compound Congesprin Imuran rimadil  Advil Cope Indocin  Robaxisal  Alka-Seltzer Effervescent Pain Reliever and Antacid Coricidin or Coricidin-D  Indomethacin Rufen  Alka-Seltzer plus Cold Medicine Cosprin Ketoprofen S-A-C Tablets  Anacin Analgesic Tablets or Capsules Coumadin Korlgesic Salflex  Anacin Extra Strength Analgesic tablets or capsules CP-2 Tablets Lanoril Salicylate  Anaprox Cuprimine Capsules Levenox Salocol  Anexsia-D Dalteparin Magan Salsalate  Anodynos Darvon compound Magnesium Salicylate Sine-off  Ansaid Dasin Capsules Magsal Sodium Salicylate  Anturane Depen Capsules Marnal Soma  APF Arthritis pain formula Dewitt's Pills Measurin Stanback  Argesic Dia-Gesic Meclofenamic Sulfinpyrazone  Arthritis Bayer Timed Release Aspirin Diclofenac Meclomen Sulindac  Arthritis pain formula Anacin Dicumarol Medipren Supac  Analgesic (Safety coated) Arthralgen Diffunasal Mefanamic Suprofen  Arthritis Strength Bufferin Dihydrocodeine Mepro Compound Suprol  Arthropan liquid Dopirydamole Methcarbomol with Aspirin Synalgos  ASA tablets/Enseals Disalcid Micrainin Tagament  Ascriptin Doan's Midol Talwin  Ascriptin A/D Dolene Mobidin Tanderil  Ascriptin Extra Strength Dolobid Moblgesic Ticlid  Ascriptin with Codeine Doloprin or Doloprin with Codeine Momentum Tolectin  Asperbuf Duoprin Mono-gesic Trendar  Aspergum Duradyne Motrin or Motrin IB Triminicin  Aspirin plain, buffered or enteric coated Durasal Myochrisine Trigesic  Aspirin Suppositories Easprin Nalfon Trillsate  Aspirin with Codeine Ecotrin Regular or Extra Strength Naprosyn Uracel  Atromid-S Efficin Naproxen Ursinus  Auranofin Capsules Elmiron Neocylate Vanquish  Axotal Emagrin Norgesic Verin  Azathioprine Empirin or Empirin with  Codeine Normiflo Vitamin E  Azolid Emprazil Nuprin Voltaren  Bayer Aspirin plain, buffered or children's or timed BC Tablets or powders Encaprin Orgaran Warfarin Sodium  Buff-a-Comp Enoxaparin Orudis Zorpin  Buff-a-Comp with Codeine Equegesic Os-Cal-Gesic    Buffaprin Excedrin plain, buffered or Extra Strength Oxalid   Bufferin Arthritis Strength Feldene Oxphenbutazone   Bufferin plain or Extra Strength Feldene Capsules Oxycodone with Aspirin   Bufferin with Codeine Fenoprofen Fenoprofen Pabalate or Pabalate-SF   Buffets II Flogesic Panagesic   Buffinol plain or Extra Strength Florinal or Florinal with Codeine Panwarfarin   Buf-Tabs Flurbiprofen Penicillamine   Butalbital Compound Four-way cold tablets Penicillin   Butazolidin Fragmin Pepto-Bismol   Carbenicillin Geminisyn Percodan   Carna Arthritis Reliever Geopen Persantine   Carprofen Gold's salt Persistin   Chloramphenicol Goody's Phenylbutazone   Chloromycetin Haltrain Piroxlcam   Clmetidine heparin Plaquenil   Cllnoril Hyco-pap Ponstel   Clofibrate Hydroxy chloroquine Propoxyphen         Before stopping any of these medications, be sure to consult the physician who ordered them.  Some, such as Coumadin (Warfarin) are ordered to prevent or treat serious conditions such as "deep thrombosis", "pumonary embolisms", and other heart problems.  The amount of time that you may need off of the medication may also vary with the medication and the reason for which you were taking it.  If you are taking any of these medications, please make sure you notify your pain physician before you undergo any procedures.         Drug Holidays  Definitions Tolerance:  Defined as the progressively decreased responsiveness to a drug.  Occurs when the drug is used repeatedly and the body adapts to the continued presence of the drug.  As a result, a larger dose of the drug is needed to achieve the effect originally obtained by a smaller dose.  It is thought to be due to the formation of excess opioid receptors.  Drug Holiday:   Is when a patient stops taking a medication(s) for a period of time; anywhere from a few days to several weeks.  Withdrawals:   Refers to the wide range of symptoms that occur  after stopping or dramatically reducing opiate drugs after heavy or prolonged use.  Withdrawal symptoms do not occur to patients that use low dose opioids, or those who take the medication sporadically.  Contrary to benzodiazepine (example: Valium, Xanax, etc.) or alcohol withdrawals ("Delirium Tremens"), opioid withdrawals are not lethal.  Withdrawals are the physical manifestation of the body getting rid of the excess receptors.  Purpose To eliminate tolerance.  Duration of Holiday 14 consecutive days. (2 weeks)  Expected Symptoms Early symptoms of withdrawal include:   Agitation  Anxiety  Muscle Aches   Increased tearing  Insomnia  Runny Nose   Sweating  Yawning     Late symptoms of withdrawal include:   Abdominal cramping  Diarrhea  Dilated pupils   Goose bumps  Nausea  Vomiting   Opioid withdrawal reactions are very uncomfortable but are not life-threatening.  Symptoms usually start within 12 hours of last opioid dose and within 30 hours of last methadone exposure.  Duration of Symptoms 48-72 hours of short acting medications and 2 to 4 days for methadone.  Treatment  Clonidine (CatepresT) or tizanidine (ZanaflexT) for agitation, sweating, tearing, runny nose.  Promethazine (PhenerganT) for nausea, vomiting.  NSAIDs for pain.  Benefits 12. Improved effectiveness of opioids. 13. Decreased opioid dose needed to achieve benefits. 14. Improved pain with lesser  dose.

## 2016-02-14 LAB — TOXASSURE SELECT 13 (MW), URINE

## 2016-04-06 ENCOUNTER — Telehealth: Payer: Self-pay | Admitting: *Deleted

## 2016-04-12 NOTE — Telephone Encounter (Signed)
No prior auth required - KW has scheduled the appointment for 04/24/2016

## 2016-04-24 ENCOUNTER — Ambulatory Visit
Admission: RE | Admit: 2016-04-24 | Discharge: 2016-04-24 | Disposition: A | Discharge status: Home / Self Care | Payer: Medicare Other | Source: Ambulatory Visit | Attending: Pain Medicine | Admitting: Pain Medicine

## 2016-04-24 ENCOUNTER — Encounter: Payer: Self-pay | Admitting: Pain Medicine

## 2016-04-24 ENCOUNTER — Ambulatory Visit (HOSPITAL_BASED_OUTPATIENT_CLINIC_OR_DEPARTMENT_OTHER): Payer: Medicare Other | Admitting: Pain Medicine

## 2016-04-24 VITALS — BP 130/68 | HR 48 | Temp 98.0°F | Resp 12 | Ht 68.0 in | Wt 225.0 lb

## 2016-04-24 DIAGNOSIS — M25511 Pain in right shoulder: Secondary | ICD-10-CM | POA: Diagnosis not present

## 2016-04-24 DIAGNOSIS — Z888 Allergy status to other drugs, medicaments and biological substances status: Secondary | ICD-10-CM | POA: Insufficient documentation

## 2016-04-24 DIAGNOSIS — Z880 Allergy status to penicillin: Secondary | ICD-10-CM | POA: Insufficient documentation

## 2016-04-24 DIAGNOSIS — M25512 Pain in left shoulder: Secondary | ICD-10-CM | POA: Insufficient documentation

## 2016-04-24 DIAGNOSIS — G8929 Other chronic pain: Secondary | ICD-10-CM

## 2016-04-24 DIAGNOSIS — Z96653 Presence of artificial knee joint, bilateral: Secondary | ICD-10-CM | POA: Diagnosis not present

## 2016-04-24 DIAGNOSIS — Z8551 Personal history of malignant neoplasm of bladder: Secondary | ICD-10-CM | POA: Insufficient documentation

## 2016-04-24 MED ORDER — METHYLPREDNISOLONE ACETATE 80 MG/ML IJ SUSP
80.0000 mg | Freq: Once | INTRAMUSCULAR | Status: AC
  Administered 2016-04-24: 80 mg
  Filled 2016-04-24: qty 1

## 2016-04-24 MED ORDER — LIDOCAINE HCL (PF) 1 % IJ SOLN
10.0000 mL | Freq: Once | INTRAMUSCULAR | Status: AC
  Administered 2016-04-24: 10 mL
  Filled 2016-04-24: qty 10

## 2016-04-24 MED ORDER — ROPIVACAINE HCL 2 MG/ML IJ SOLN
4.0000 mL | Freq: Once | INTRAMUSCULAR | Status: AC
  Administered 2016-04-24: 4 mL
  Filled 2016-04-24: qty 10

## 2016-04-24 NOTE — Progress Notes (Signed)
Patient's Name: Aaron Short  MRN: [id number]  Referring Provider: Neomia Dear, MD  DOB: 02/20/1946  PCP: Neomia Dear, MD  DOS: 04/24/2016  Note by: Kathlen Brunswick. Dossie Arbour, MD  Service setting: Ambulatory outpatient  Location: [facility] (AMB) Pain Management Facility  Visit type: Procedure  Specialty: Interventional Pain Management  Patient type: Established   Primary Reason for Visit: Interventional Pain Management Treatment. CC: Shoulder Pain (left)  Procedure:  Anesthesia, Analgesia, Anxiolysis:  Type: Diagnostic Suprascapular nerve Block Region: Posterior Shoulder & Scapular Areas Level: Superior to the scapular spine, in the lateral aspect of the supraspinatus fossa (Suprescapular notch). Laterality: Left-Side  Type: Local Anesthesia Local Anesthetic: Lidocaine 1% Route: Infiltration (/IM) IV Access: Declined Sedation: Declined  Indication(s): Analgesia          Indications: 1. Chronic shoulder pain (Location of Primary Source of Pain) (Bilateral) (L>R)    Pain Score: Pre-procedure: 7 /10 Post-procedure: 0-No pain (numb)/10  Pre-Procedure Assessment:  Aaron Short is a 70 y.o. (year old), male patient, seen today for interventional treatment. He  has a past surgical history that includes bladder cancer surgery (2015); Joint replacement; Replacement total hip w/  resurfacing implants (Bilateral); Total knee arthroplasty (Bilateral); and Shoulder surgery (Bilateral).. His primarily concern today is the Shoulder Pain (left) The encounter diagnosis was Chronic shoulder pain (Location of Primary Source of Pain) (Bilateral) (L>R).  Pain Type: Chronic pain Pain Location: Shoulder Pain Orientation: Left Pain Descriptors / Indicators: Larence Penning, Aching, Shooting Pain Frequency: Intermittent  Date of Last Visit: 02/07/16 Service Provided on Last Visit: Med Refill  Coagulation Parameters No results found for: INR, LABPROT, APTT, PLT. The patient stopped the Coumadin  5 days ago. He was instructed to go back on it tonight. Verification of the correct person, correct site (including marking of site), and correct procedure were performed and confirmed by the patient.  Consent: Before the procedure and under the influence of no sedative(s), amnesic(s), or anxiolytics, the patient was informed of the treatment options, risks and possible complications. To fulfill our ethical and legal obligations, as recommended by the American Medical Association's Code of Ethics, I have informed the patient of my clinical impression; the nature and purpose of the treatment or procedure; the risks, benefits, and possible complications of the intervention; the alternatives, including doing nothing; the risk(s) and benefit(s) of the alternative treatment(s) or procedure(s); and the risk(s) and benefit(s) of doing nothing. The patient was provided information about the general risks and possible complications associated with the procedure. These may include, but are not limited to: failure to achieve desired goals, infection, bleeding, organ or nerve damage, allergic reactions, paralysis, and death. In addition, the patient was informed of those risks and complications associated to the procedure, such as failure to decrease pain; infection; bleeding; organ or nerve damage with subsequent damage to sensory, motor, and/or autonomic systems, resulting in permanent pain, numbness, and/or weakness of one or several areas of the body; allergic reactions; (i.e.: anaphylactic reaction); and/or death. Furthermore, the patient was informed of those risks and complications associated with the medications. These include, but are not limited to: allergic reactions (i.e.: anaphylactic or anaphylactoid reaction(s)); adrenal axis suppression; blood sugar elevation that in diabetics may result in ketoacidosis or comma; water retention that in patients with history of congestive heart failure may result in  shortness of breath, pulmonary edema, and decompensation with resultant heart failure; weight gain; swelling or edema; medication-induced neural toxicity; particulate matter embolism and blood vessel occlusion with resultant organ, and/or nervous system  infarction; and/or aseptic necrosis of one or more joints. Finally, the patient was informed that Medicine is not an exact science; therefore, there is also the possibility of unforeseen or unpredictable risks and/or possible complications that may result in a catastrophic outcome. The patient indicated having understood very clearly. We have given the patient no guarantees and we have made no promises. Enough time was given to the patient to ask questions, all of which were answered to the patient's satisfaction. Mr. Tornes has indicated that he wanted to continue with the procedure.  Consent Attestation: I, the ordering provider, attest that I have discussed with the patient the benefits, risks, side-effects, alternatives, likelihood of achieving goals, and potential problems during recovery for the procedure that I have provided informed consent.  Pre-Procedure Preparation:  Safety Precautions: Allergies reviewed. The patient was asked about blood thinners, or active infections, both of which were denied. The patient was asked to confirm the procedure and laterality, before marking the site, and again before commencing the procedure. Appropriate site, procedure, and patient were confirmed by following the Joint Commission's Universal Protocol (UP.01.01.01), in the form of a "Time Out". The patient was asked to participate by confirming the accuracy of the "Time Out" information. Patient was assessed for positional comfort and pressure points before starting the procedure. Allergies: He is allergic to diltiazem; penicillins; and tizanidine. Allergy Precautions: None required Infection Control Precautions: Sterile technique used. Standard Universal  Precautions were taken as recommended by the Department of [facility] for Disease Control and Prevention (CDC). Standard pre-surgical skin prep was conducted. Respiratory hygiene and cough etiquette was practiced. Hand hygiene observed. Safe injection practices and needle disposal techniques followed. SDV (single dose vial) medications used. Medications properly checked for expiration dates and contaminants. Personal protective equipment (PPE) used as per protocol. Monitoring:  As per clinic protocol. Vitals:   04/24/16 0845 04/24/16 0955 04/24/16 1000 04/24/16 1005  BP: 128/63 (!) 120/59 (!) 114/59 130/68  Pulse: (!) 56 (!) 46 (!) 45 (!) 48  Resp: 16 12 11 12   Temp: 98 F (36.7 C)     SpO2: 100% 99% 100% 95%  Weight: 225 lb (102.1 kg)     Height: 5\' 8"  (1.727 m)     Calculated BMI: Body mass index is 34.21 kg/m. Time-out: "Time-out" completed before starting procedure, as per protocol.  Description of Procedure Process:   Time-out: "Time-out" completed before starting procedure, as per protocol. Position: Prone", Target Area: Suprascapular notch. Approach: Posterior approach. Area Prepped: Entire shoulder Area Prepping solution: ChloraPrep (2% chlorhexidine gluconate and 70% isopropyl alcohol) Safety Precautions: Aspiration looking for blood return was conducted prior to all injections. At no point did we inject any substances, as a needle was being advanced. No attempts were made at seeking any paresthesias. Safe injection practices and needle disposal techniques used. Medications properly checked for expiration dates. SDV (single dose vial) medications used. Description of the Procedure: Protocol guidelines were followed. The patient was placed in position over the procedure table. The target area was identified and the area prepped in the usual manner. Skin & deeper tissues infiltrated with local anesthetic. Appropriate amount of time allowed to pass for local anesthetics to  take effect. The procedure needles were then advanced to the target area. Proper needle placement secured. Negative aspiration confirmed. Solution injected in intermittent fashion, asking for systemic symptoms every 0.5cc of injectate. The needles were then removed and the area cleansed, making sure to leave some of the prepping solution back to  take advantage of its long term bactericidal properties. EBL: None Materials & Medications Used:  Needle(s) Used: 22g - 3.5" Spinal Needle(s) Solution Injected: 0.2% PF-Ropivacaine (75ml) + SDV-DepoMedrol 80 mg/ml (53ml) Medications Administered today: We administered methylPREDNISolone acetate, lidocaine (PF), and ropivacaine (PF) 2 mg/ml (0.2%).Please see chart orders for dosing details.  Imaging Guidance (Non-Spinal):  Type of Imaging Technique: Fluoroscopy Guidance (Non-Spinal) Indication(s): Assistance in needle guidance and placement for procedures requiring needle placement in or near specific anatomical locations not easily accessible without such assistance. Exposure Time: Please see nurses notes. Contrast: None used. Fluoroscopic Guidance: I was personally present during the use of fluoroscopy. "Tunnel Vision Technique" used to obtain the best possible view of the target area. Parallax error corrected before commencing the procedure. "Direction-depth-direction" technique used to introduce the needle under continuous pulsed fluoroscopy. Once target was reached, antero-posterior, oblique, and lateral fluoroscopic projection used confirm needle placement in all planes. Images permanently stored in EMR. Interpretation: No contrast injected. I personally interpreted the imaging intraoperatively. Adequate needle placement confirmed in multiple planes. Permanent images saved into the patient's record.  Antibiotic Prophylaxis:  Indication(s): No indications identified. Type:  Antibiotics Given (last 72 hours)    None      Post-operative Assessment:    Complications: No immediate post-treatment complications observed by team, or reported by patient. Disposition: The patient tolerated the entire procedure well. A repeat set of vitals were taken after the procedure and the patient was kept under observation following institutional policy, for this type of procedure. Post-procedural neurological assessment was performed, showing return to baseline, prior to discharge. The patient was provided with post-procedure discharge instructions, including a section on how to identify potential problems. Should any problems arise concerning this procedure, the patient was given instructions to immediately contact us, at any time, without hesitation. In any case, we plan to contact the patient by telephone for a follow-up status report regarding this interventional procedure. Comments:  No additional relevant information.  Plan of Care  Discharge to: Discharge home  Medications ordered for procedure: Meds ordered this encounter  Medications  . methylPREDNISolone acetate (DEPO-MEDROL) injection 80 mg  . lidocaine (PF) (XYLOCAINE) 1 % injection 10 mL  . ropivacaine (PF) 2 mg/ml (0.2%) (NAROPIN) epidural 4 mL   Medications administered: (For more details, see medical record) We administered methylPREDNISolone acetate, lidocaine (PF), and ropivacaine (PF) 2 mg/ml (0.2%). Lab-work, Procedure(s), & Referral(s) Ordered: Orders Placed This Encounter  Procedures  . SUPRASCAPULAR NERVE BLOCK  . DG C-Arm 1-60 Min-No Report   Imaging Ordered: No results found for this or any previous visit. New Prescriptions   No medications on file   Physician-requested Follow-up:  Return in about 2 weeks (around 05/08/2016) for Post-Procedure evaluation.  Future Appointments Date Time Provider [place]  05/11/2016 9:45 AM Milinda Pointer, MD [facility] None   Primary Care Physician: Neomia Dear, MD Location: [facility] Note  by: Kathlen Brunswick. Dossie Arbour, M.D, DABA, DABAPM, DABPM, DABIPP, FIPP  Disclaimer:  Medicine is not an exact science. The only guarantee in medicine is that nothing is guaranteed. It is important to note that the decision to proceed with this intervention was based on the information collected from the patient. The Data and conclusions were drawn from the patient's questionnaire, the interview, and the physical examination. Because the information was provided in large part by the patient, it cannot be guaranteed that it has not been purposely or unconsciously manipulated. Every effort has been made to obtain as much  relevant data as possible for this evaluation. It is important to note that the conclusions that lead to this procedure are derived in large part from the available data. Always take into account that the treatment will also be dependent on availability of resources and existing treatment guidelines, considered by other Pain Management Practitioners as being common knowledge and practice, at the time of the intervention. For Medico-Legal purposes, it is also important to point out that variation in procedural techniques and pharmacological choices are the acceptable norm. The indications, contraindications, technique, and results of the above procedure should only be interpreted and judged by a Board-Certified Interventional Pain Specialist with extensive familiarity and expertise in the same exact procedure and technique. Attempts at providing opinions without similar or greater experience and expertise than that of the treating physician will be considered as inappropriate and unethical, and shall result in a formal complaint to the state medical board and applicable specialty societies.  Instructions provided at this appointment: Patient Instructions  Steps to Quit Smoking Smoking tobacco can be bad for your health. It can also affect almost every organ in your body. Smoking puts you and people  around you at risk for many serious long-lasting (chronic) diseases. Quitting smoking is hard, but it is one of the best things that you can do for your health. It is never too late to quit. What are the benefits of quitting smoking? When you quit smoking, you lower your risk for getting serious diseases and conditions. They can include:  Lung cancer or lung disease.  Heart disease.  Stroke.  Heart attack.  Not being able to have children (infertility).  Weak bones (osteoporosis) and broken bones (fractures). If you have coughing, wheezing, and shortness of breath, those symptoms may get better when you quit. You may also get sick less often. If you are pregnant, quitting smoking can help to lower your chances of having a baby of low birth weight. What can I do to help me quit smoking? Talk with your doctor about what can help you quit smoking. Some things you can do (strategies) include:  Quitting smoking totally, instead of slowly cutting back how much you smoke over a period of time.  Going to in-person counseling. You are more likely to quit if you go to many counseling sessions.  Using resources and support systems, such as:  Online chats with a Social worker.  Phone quitlines.  Printed Furniture conservator/restorer.  Support groups or group counseling.  Text messaging programs.  Mobile phone apps or applications.  Taking medicines. Some of these medicines may have nicotine in them. If you are pregnant or breastfeeding, do not take any medicines to quit smoking unless your doctor says it is okay. Talk with your doctor about counseling or other things that can help you. Talk with your doctor about using more than one strategy at the same time, such as taking medicines while you are also going to in-person counseling. This can help make quitting easier. What things can I do to make it easier to quit? Quitting smoking might feel very hard at first, but there is a lot that you can do to  make it easier. Take these steps:  Talk to your family and friends. Ask them to support and encourage you.  Call phone quitlines, reach out to support groups, or work with a Social worker.  Ask people who smoke to not smoke around you.  Avoid places that make you want (trigger) to smoke, such as:  Bars.  Parties.  Smoke-break areas at work.  Spend time with people who do not smoke.  Lower the stress in your life. Stress can make you want to smoke. Try these things to help your stress:  Getting regular exercise.  Deep-breathing exercises.  Yoga.  Meditating.  Doing a body scan. To do this, close your eyes, focus on one area of your body at a time from head to toe, and notice which parts of your body are tense. Try to relax the muscles in those areas.  Download or buy apps on your mobile phone or tablet that can help you stick to your quit plan. There are many free apps, such as QuitGuide from the [employer] Office manager for Disease Control and Prevention). You can find more support from smokefree.gov and other websites. This information is not intended to replace advice given to you by your health care provider. Make sure you discuss any questions you have with your health care provider. Document Released: 03/11/2009 Document Revised: 01/11/2016 Document Reviewed: 09/29/2014 Elsevier Interactive Patient Education  2017 [employer]. Pain Management Discharge Instructions  General Discharge Instructions :  If you need to reach your doctor call: Monday-Friday 8:00 am - 4:00 pm at [phone] or toll free [phone].  After clinic hours [phone] to have operator reach doctor.  Bring all of your medication bottles to all your appointments in the pain clinic.  To cancel or reschedule your appointment with Pain Management please remember to call 24 hours in advance to avoid a fee.  Refer to the educational materials which you have been given on: General Risks, I had my Procedure.  Discharge Instructions, Post Sedation.  Post Procedure Instructions:  The drugs you were given will stay in your system until tomorrow, so for the next 24 hours you should not drive, make any legal decisions or drink any alcoholic beverages.  You may eat anything you prefer, but it is better to start with liquids then soups and crackers, and gradually work up to solid foods.  Please notify your doctor immediately if you have any unusual bleeding, trouble breathing or pain that is not related to your normal pain.  Depending on the type of procedure that was done, some parts of your body may feel week and/or numb.  This usually clears up by tonight or the next day.  Walk with the use of an assistive device or accompanied by an adult for the 24 hours.  You may use ice on the affected area for the first 24 hours.  Put ice in a Ziploc bag and cover with a towel and place against area 15 minutes on 15 minutes off.  You may switch to heat after 24 hours. Trigger Point Injection Trigger points are areas where you have pain. A trigger point injection is a shot given in the trigger point to help relieve pain for a few days to a few months. Common places for trigger points include:  The neck.  The shoulders.  The upper back.  The lower back. A trigger point injection will not cure long-lasting (chronic) pain permanently. These injections do not always work for every person, but for some people they can help to relieve pain for a few days to a few months. Tell a health care provider about:  Any allergies you have.  All medicines you are taking, including vitamins, herbs, eye drops, creams, and over-the-counter medicines.  Any problems you or family members have had with anesthetic medicines.  Any blood disorders you have.  Any surgeries you have had.  Any medical conditions you have. What are the risks? Generally, this is a safe procedure. However, problems may occur,  including:  Infection.  Bleeding.  Allergic reaction to the injected medicine.  Irritation of the skin around the injection site. What happens before the procedure?  Ask your health care provider about changing or stopping your regular medicines. This is especially important if you are taking diabetes medicines or blood thinners. What happens during the procedure?  Your health care provider will feel for trigger points. A marker may be used to circle the area for the injection.  The skin over the trigger point will be washed with a germ-killing (antiseptic) solution.  A thin needle is used for the shot. You may feel pain or a twitching feeling when the needle enters the trigger point.  A numbing solution may be injected into the trigger point. Sometimes a medicine to keep down swelling, redness, and warmth (inflammation) is also injected.  Your health care provider may move the needle around the area where the trigger point is located until the tightness and twitching goes away.  After the injection, your health care provider may put gentle pressure over the injection site.  The injection site will be covered with a bandage (dressing). The procedure may vary among health care providers and hospitals. What happens after the procedure?  The dressing can be taken off in a few hours or as told by your health care provider.  You may feel sore and stiff for 1-2 days. This information is not intended to replace advice given to you by your health care provider. Make sure you discuss any questions you have with your health care provider. Document Released: 05/04/2011 Document Revised: 01/16/2016 Document Reviewed: 11/02/2014 Elsevier Interactive Patient Education  2017 [employer].

## 2016-04-24 NOTE — Patient Instructions (Addendum)
Steps to Quit Smoking Smoking tobacco can be bad for your health. It can also affect almost every organ in your body. Smoking puts you and people around you at risk for many serious long-lasting (chronic) diseases. Quitting smoking is hard, but it is one of the best things that you can do for your health. It is never too late to quit. What are the benefits of quitting smoking? When you quit smoking, you lower your risk for getting serious diseases and conditions. They can include:  Lung cancer or lung disease.  Heart disease.  Stroke.  Heart attack.  Not being able to have children (infertility).  Weak bones (osteoporosis) and broken bones (fractures). If you have coughing, wheezing, and shortness of breath, those symptoms may get better when you quit. You may also get sick less often. If you are pregnant, quitting smoking can help to lower your chances of having a baby of low birth weight. What can I do to help me quit smoking? Talk with your doctor about what can help you quit smoking. Some things you can do (strategies) include:  Quitting smoking totally, instead of slowly cutting back how much you smoke over a period of time.  Going to in-person counseling. You are more likely to quit if you go to many counseling sessions.  Using resources and support systems, such as:  Online chats with a counselor.  Phone quitlines.  Printed self-help materials.  Support groups or group counseling.  Text messaging programs.  Mobile phone apps or applications.  Taking medicines. Some of these medicines may have nicotine in them. If you are pregnant or breastfeeding, do not take any medicines to quit smoking unless your doctor says it is okay. Talk with your doctor about counseling or other things that can help you. Talk with your doctor about using more than one strategy at the same time, such as taking medicines while you are also going to in-person counseling. This can help make quitting  easier. What things can I do to make it easier to quit? Quitting smoking might feel very hard at first, but there is a lot that you can do to make it easier. Take these steps:  Talk to your family and friends. Ask them to support and encourage you.  Call phone quitlines, reach out to support groups, or work with a counselor.  Ask people who smoke to not smoke around you.  Avoid places that make you want (trigger) to smoke, such as:  Bars.  Parties.  Smoke-break areas at work.  Spend time with people who do not smoke.  Lower the stress in your life. Stress can make you want to smoke. Try these things to help your stress:  Getting regular exercise.  Deep-breathing exercises.  Yoga.  Meditating.  Doing a body scan. To do this, close your eyes, focus on one area of your body at a time from head to toe, and notice which parts of your body are tense. Try to relax the muscles in those areas.  Download or buy apps on your mobile phone or tablet that can help you stick to your quit plan. There are many free apps, such as QuitGuide from the CDC (Centers for Disease Control and Prevention). You can find more support from smokefree.gov and other websites. This information is not intended to replace advice given to you by your health care provider. Make sure you discuss any questions you have with your health care provider. Document Released: 03/11/2009 Document Revised: 01/11/2016 Document   Reviewed: 09/29/2014 Elsevier Interactive Patient Education  2017 [place]. Pain Management Discharge Instructions  General Discharge Instructions :  If you need to reach your doctor call: Monday-Friday 8:00 am - 4:00 pm at [phone] or toll free [phone].  After clinic hours [phone] to have operator reach doctor.  Bring all of your medication bottles to all your appointments in the pain clinic.  To cancel or reschedule your appointment with Pain Management please remember to call  24 hours in advance to avoid a fee.  Refer to the educational materials which you have been given on: General Risks, I had my Procedure. Discharge Instructions, Post Sedation.  Post Procedure Instructions:  The drugs you were given will stay in your system until tomorrow, so for the next 24 hours you should not drive, make any legal decisions or drink any alcoholic beverages.  You may eat anything you prefer, but it is better to start with liquids then soups and crackers, and gradually work up to solid foods.  Please notify your doctor immediately if you have any unusual bleeding, trouble breathing or pain that is not related to your normal pain.  Depending on the type of procedure that was done, some parts of your body may feel week and/or numb.  This usually clears up by tonight or the next day.  Walk with the use of an assistive device or accompanied by an adult for the 24 hours.  You may use ice on the affected area for the first 24 hours.  Put ice in a Ziploc bag and cover with a towel and place against area 15 minutes on 15 minutes off.  You may switch to heat after 24 hours. Trigger Point Injection Trigger points are areas where you have pain. A trigger point injection is a shot given in the trigger point to help relieve pain for a few days to a few months. Common places for trigger points include:  The neck.  The shoulders.  The upper back.  The lower back. A trigger point injection will not cure long-lasting (chronic) pain permanently. These injections do not always work for every person, but for some people they can help to relieve pain for a few days to a few months. Tell a health care provider about:  Any allergies you have.  All medicines you are taking, including vitamins, herbs, eye drops, creams, and over-the-counter medicines.  Any problems you or family members have had with anesthetic medicines.  Any blood disorders you have.  Any surgeries you have  had.  Any medical conditions you have. What are the risks? Generally, this is a safe procedure. However, problems may occur, including:  Infection.  Bleeding.  Allergic reaction to the injected medicine.  Irritation of the skin around the injection site. What happens before the procedure?  Ask your health care provider about changing or stopping your regular medicines. This is especially important if you are taking diabetes medicines or blood thinners. What happens during the procedure?  Your health care provider will feel for trigger points. A marker may be used to circle the area for the injection.  The skin over the trigger point will be washed with a germ-killing (antiseptic) solution.  A thin needle is used for the shot. You may feel pain or a twitching feeling when the needle enters the trigger point.  A numbing solution may be injected into the trigger point. Sometimes a medicine to keep down swelling, redness, and warmth (inflammation) is also injected.  Your health  care provider may move the needle around the area where the trigger point is located until the tightness and twitching goes away.  After the injection, your health care provider may put gentle pressure over the injection site.  The injection site will be covered with a bandage (dressing). The procedure may vary among health care providers and hospitals. What happens after the procedure?  The dressing can be taken off in a few hours or as told by your health care provider.  You may feel sore and stiff for 1-2 days. This information is not intended to replace advice given to you by your health care provider. Make sure you discuss any questions you have with your health care provider. Document Released: 05/04/2011 Document Revised: 01/16/2016 Document Reviewed: 11/02/2014 Elsevier Interactive Patient Education  2017 [employer].

## 2016-04-25 ENCOUNTER — Telehealth: Payer: Self-pay | Admitting: *Deleted

## 2016-04-25 NOTE — Telephone Encounter (Signed)
LVM for patient to return call for any post-procedure concerns. - 

## 2016-05-08 ENCOUNTER — Encounter: Payer: Medicare Other | Admitting: Pain Medicine

## 2016-05-11 ENCOUNTER — Ambulatory Visit: Payer: Medicare Other | Attending: Pain Medicine | Admitting: Pain Medicine

## 2016-05-11 ENCOUNTER — Encounter: Payer: Self-pay | Admitting: Pain Medicine

## 2016-05-11 VITALS — BP 152/65 | HR 68 | Temp 98.2°F | Resp 18 | Ht 68.0 in | Wt 225.0 lb

## 2016-05-11 DIAGNOSIS — M542 Cervicalgia: Secondary | ICD-10-CM | POA: Diagnosis not present

## 2016-05-11 DIAGNOSIS — F1190 Opioid use, unspecified, uncomplicated: Secondary | ICD-10-CM

## 2016-05-11 DIAGNOSIS — G8929 Other chronic pain: Secondary | ICD-10-CM

## 2016-05-11 DIAGNOSIS — Z888 Allergy status to other drugs, medicaments and biological substances status: Secondary | ICD-10-CM | POA: Insufficient documentation

## 2016-05-11 DIAGNOSIS — M545 Low back pain: Secondary | ICD-10-CM | POA: Diagnosis not present

## 2016-05-11 DIAGNOSIS — M25511 Pain in right shoulder: Secondary | ICD-10-CM

## 2016-05-11 DIAGNOSIS — Z96653 Presence of artificial knee joint, bilateral: Secondary | ICD-10-CM | POA: Diagnosis not present

## 2016-05-11 DIAGNOSIS — G894 Chronic pain syndrome: Secondary | ICD-10-CM | POA: Diagnosis present

## 2016-05-11 DIAGNOSIS — M25559 Pain in unspecified hip: Secondary | ICD-10-CM

## 2016-05-11 DIAGNOSIS — M549 Dorsalgia, unspecified: Secondary | ICD-10-CM | POA: Insufficient documentation

## 2016-05-11 DIAGNOSIS — E669 Obesity, unspecified: Secondary | ICD-10-CM | POA: Insufficient documentation

## 2016-05-11 DIAGNOSIS — Z96643 Presence of artificial hip joint, bilateral: Secondary | ICD-10-CM | POA: Insufficient documentation

## 2016-05-11 DIAGNOSIS — I10 Essential (primary) hypertension: Secondary | ICD-10-CM | POA: Insufficient documentation

## 2016-05-11 DIAGNOSIS — E119 Type 2 diabetes mellitus without complications: Secondary | ICD-10-CM | POA: Insufficient documentation

## 2016-05-11 DIAGNOSIS — Z79899 Other long term (current) drug therapy: Secondary | ICD-10-CM | POA: Insufficient documentation

## 2016-05-11 DIAGNOSIS — Z7984 Long term (current) use of oral hypoglycemic drugs: Secondary | ICD-10-CM | POA: Diagnosis not present

## 2016-05-11 DIAGNOSIS — F17210 Nicotine dependence, cigarettes, uncomplicated: Secondary | ICD-10-CM | POA: Insufficient documentation

## 2016-05-11 DIAGNOSIS — M25512 Pain in left shoulder: Secondary | ICD-10-CM

## 2016-05-11 DIAGNOSIS — Z9889 Other specified postprocedural states: Secondary | ICD-10-CM | POA: Insufficient documentation

## 2016-05-11 DIAGNOSIS — Z79891 Long term (current) use of opiate analgesic: Secondary | ICD-10-CM | POA: Insufficient documentation

## 2016-05-11 DIAGNOSIS — I4891 Unspecified atrial fibrillation: Secondary | ICD-10-CM | POA: Insufficient documentation

## 2016-05-11 DIAGNOSIS — E7801 Familial hypercholesterolemia: Secondary | ICD-10-CM | POA: Insufficient documentation

## 2016-05-11 DIAGNOSIS — Z7901 Long term (current) use of anticoagulants: Secondary | ICD-10-CM | POA: Diagnosis not present

## 2016-05-11 DIAGNOSIS — Z880 Allergy status to penicillin: Secondary | ICD-10-CM | POA: Insufficient documentation

## 2016-05-11 MED ORDER — MORPHINE SULFATE ER 30 MG PO TBCR: 30.0000 mg | EXTENDED_RELEASE_TABLET | Freq: Two times a day (BID) | ORAL | 0 refills | Status: DC

## 2016-05-11 MED ORDER — OXYCODONE-ACETAMINOPHEN 10-325 MG PO TABS: 1.0000 | ORAL_TABLET | Freq: Three times a day (TID) | ORAL | 0 refills | Status: DC | PRN

## 2016-05-11 NOTE — Progress Notes (Signed)
Patient's Name: Aaron Short  MRN: [id number]  Referring Provider: Neomia Dear, MD  DOB: 01/02/1946  PCP: Neomia Dear, MD  DOS: 05/11/2016  Note by: Kathlen Brunswick. Dossie Arbour, MD  Service setting: Ambulatory outpatient  Specialty: Interventional Pain Management  Location: [facility] (AMB) Pain Management Facility    Patient type: Established   Primary Reason(s) for Visit: Encounter for prescription drug management & post-procedure evaluation of chronic illness with mild to moderate exacerbation(Level of risk: moderate) CC: Shoulder Pain; Back Pain; and Knee Pain  HPI  Aaron Short is a 70 y.o. year old, male patient, who comes today for a post-procedure evaluation and medication management. He has Long term current use of opiate analgesic; Long term prescription opiate use; Opiate use (105 MME/Day); Encounter for therapeutic drug level monitoring; Encounter for chronic pain management; Opioid dependence, daily use ([place]); Chronic hip pain (Location of Secondary source of pain) (Bilateral) (R>L); Chronic shoulder pain (Location of Primary Source of Pain) (Bilateral) (L>R); Chronic low back pain (Location of Tertiary source of pain) (Bilateral) (R>L); Chronic knee pain (Bilateral) (R>L); S/P THR: total hip replacement (Bilateral); S/P TKR: total knee replacement (Bilateral); Adiposity; Atrial fibrillation ([place]); Malignant neoplasm of urinary bladder ([place]); Type 2 diabetes mellitus ([place]); Essential (primary) hypertension; Muscle spasticity; Long term current use of anticoagulant; Combined fat and carbohydrate induced hyperlipemia; Pure hypercholesterolemia; History of bladder cancer; History of hip fracture; History of pelvic fracture; History of anticoagulant therapy; Chronic neck pain; Chondrocalcinosis; Radicular pain of shoulder; History of shoulder surgery; Myofascial pain; Musculoskeletal pain; Muscle spasm; Lumbar facet syndrome (Bilateral) (R>L); Lumbar spondylosis; Morbid obesity ([place]);  Continuous opioid dependence ([place]); Disturbance of skin sensation; Malignant neoplasm of bladder ([place]); Smoker; FH: atrial fibrillation; Malignant neoplasm of overlapping sites of bladder ([place]); Obesity, Class I, BMI 30-34.9; and Chronic pain syndrome on his problem list. His primarily concern today is the Shoulder Pain; Back Pain; and Knee Pain  Pain Assessment: Self-Reported Pain Score: 1 /10             Reported level is compatible with observation.       Pain Type: Chronic pain Pain Location: Shoulder Pain Orientation: Left Pain Descriptors / Indicators: Dull, Aching, Shooting Pain Frequency: Constant  Aaron Short was last seen on 04/24/2016 for a procedure. During today's appointment we reviewed Aaron Short post-procedure results, as well as his outpatient medication regimen.  Further details on both, my assessment(s), as well as the proposed treatment plan, please see below.  Controlled Substance Pharmacotherapy Assessment REMS (Risk Evaluation and Mitigation Strategy)  Analgesic:MS Contin 30 mg every 12 hours (60 mg/day) plus oxycodone/APAP 10/325 every 8 hours (30 mg/day) MME/day:105 mg/day  Zenovia Jarred, RN  05/11/2016  9:29 AM  Signed Nursing Pain Medication Assessment:  Safety precautions to be maintained throughout the outpatient stay will include: orient to surroundings, keep bed in low position, maintain call bell within reach at all times, provide assistance with transfer out of bed and ambulation.  Medication Inspection Compliance: Pill count conducted under aseptic conditions, in front of the patient. Neither the pills nor the bottle was removed from the patient's sight at any time. Once count was completed pills were immediately returned to the patient in their original bottle.  Medication #1: Morphine ER (MSContin) Pill Count: 7 of 60 pills remain Bottle Appearance: Standard pharmacy container. Clearly labeled. Filled Date: 44 / 21 / 2017 Medication last  intake: 05-10-16  Medication #2: Oxycodone IR Pill Count: 11 of 90 pills remain Bottle Appearance: Standard pharmacy container. Clearly labeled.  Filled Date: 64 / 21 / 2017 Medication last intake: 05-10-16   Pharmacokinetics: Liberation and absorption (onset of action): WNL Distribution (time to peak effect): WNL Metabolism and excretion (duration of action): WNL         Pharmacodynamics: Desired effects: Analgesia: Aaron Short reports >50% benefit. Functional ability: Patient reports that medication allows him to accomplish basic ADLs Clinically meaningful improvement in function (CMIF): Sustained CMIF goals met Perceived effectiveness: Described as relatively effective, allowing for increase in activities of daily living (ADL) Undesirable effects: Side-effects or Adverse reactions: None reported Monitoring: [place] PMP: Online review of the past 26-monthperiod conducted. Compliant with practice rules and regulations List of all UDS test(s) done:  Lab Results  Component Value Date   TOXASSSELUR FINAL 02/07/2016   TOXASSSELUR FINAL 11/17/2015   TStockettFINAL 08/16/2015   Last UDS on record: ToxAssure Select 13  Date Value Ref Range Status  02/07/2016 FINAL  Final    Comment:    ==================================================================== TOXASSURE SELECT 13 (MW) ==================================================================== Test                             Result       Flag       Units Drug Present and Declared for Prescription Verification   Morphine                       >7143        EXPECTED   ng/mg creat    Potential sources of large amounts of morphine in the absence of    codeine include administration of morphine or use of heroin.   Hydromorphone                  36           EXPECTED   ng/mg creat    Hydromorphone may be present as a metabolite of morphine;    concentrations of hydromorphone rarely exceed 5% of the morphine    concentration when  this is the source of hydromorphone.   Oxycodone                      659          EXPECTED   ng/mg creat   Oxymorphone                    1874         EXPECTED   ng/mg creat   Noroxycodone                   1891         EXPECTED   ng/mg creat   Noroxymorphone                 401          EXPECTED   ng/mg creat    Sources of oxycodone are scheduled prescription medications.    Oxymorphone, noroxycodone, and noroxymorphone are expected    metabolites of oxycodone. Oxymorphone is also available as a    scheduled prescription medication. ==================================================================== Test                      Result    Flag   Units      Ref Range   Creatinine              140  mg/dL      >=20 ==================================================================== Declared Medications:  The flagging and interpretation on this report are based on the  following declared medications.  Unexpected results may arise from  inaccuracies in the declared medications.  **Note: The testing scope of this panel includes these medications:  Morphine (MS Contin)  Oxycodone (Oxycodone Acetaminophen)  **Note: The testing scope of this panel does not include following  reported medications:  Acetaminophen (Oxycodone Acetaminophen)  Carisoprodol (Soma)  Digoxin (Lanoxin)  Furosemide (Lasix)  Losartan (Cozaar)  Metformin (Glucophage)  Metoprolol (Toprol)  Naloxone  Simvastatin (Zocor)  Warfarin (Coumadin) ==================================================================== For clinical consultation, please call [phone]. ====================================================================    UDS interpretation: Compliant          Medication Assessment Form: Reviewed. Patient indicates being compliant with therapy Treatment compliance: Compliant Risk Assessment Profile: Aberrant behavior: See prior evaluations. None observed or detected today Comorbid factors  increasing risk of overdose: See prior notes. No additional risks detected today Risk of substance use disorder (SUD): Low Opioid Risk Tool (ORT) Total Score:    Interpretation Table:  Score <3 = Low Risk for SUD  Score between 4-7 = Moderate Risk for SUD  Score >8 = High Risk for Opioid Abuse   Risk Mitigation Strategies:  Patient Counseling: Covered Patient-Prescriber Agreement (PPA): Present and active  Notification to other healthcare providers: Done  Pharmacologic Plan: No change in therapy, at this time  Post-Procedure Assessment  04/24/2016 Procedure: Diagnostic left sided suprascapular nerve block under fluoroscopic guidance, no sedation. Post-procedure pain score: 0/10 (100% relief) Influential Factors: BMI: 34.21 kg/m Intra-procedural challenges: None observed Assessment challenges: None detected         Post-procedural side-effects, adverse reactions, or complications: None reported Reported issues: None  Sedation: No sedation used. When no sedatives are used, the analgesic levels obtained are directly associated to the effectiveness of the local anesthetics. However, when sedation is provided, the level of analgesia obtained during the initial 1 hour following the intervention, is believed to be the result of a combination of factors. These factors may include, but are not limited to: 1. The effectiveness of the local anesthetics used. 2. The effects of the analgesic(s) and/or anxiolytic(s) used. 3. The degree of discomfort experienced by the patient at the time of the procedure. 4. The patients ability and reliability in recalling and recording the events. 5. The presence and influence of possible secondary gains and/or psychosocial factors. Reported result: Relief experienced during the 1st hour after the procedure: 100 % (Ultra-Short Term Relief) Interpretative annotation: No Analgesic or Anxiolytic given, therefore benefits are completely due to Local Anesthetics.           Effects of local anesthetic: The analgesic effects attained during this period are directly associated to the localized infiltration of local anesthetics and therefore cary significant diagnostic value as to the etiological location, or anatomical origin, of the pain. Expected duration of relief is directly dependent on the pharmacodynamics of the local anesthetic used. Long-acting (4-6 hours) anesthetics used.  Reported result: Relief during the next 4 to 6 hour after the procedure: 100 % (Short-Term Relief) Interpretative annotation: Complete relief would suggest area to be the source of the pain.          Long-term benefit: Defined as the period of time past the expected duration of local anesthetics. With the possible exception of prolonged sympathetic blockade from the local anesthetics, benefits during this period are typically attributed to, or associated with, other factors such as analgesic sensory  neuropraxia, antiinflammatory effects, or beneficial biochemical changes provided by agents other than the local anesthetics Reported result: Extended relief following procedure: 0 % (states he cant tell any diffference) (Long-Term Relief) Interpretative annotation: No benefit. This could suggest algesic mechanism to be mechanical rather than inflammatory.          Current benefits: Defined as persistent relief that continues at this point in time.   Reported results: Treated area: 0 %       Interpretative annotation: Recurrance of symptoms. This would suggest persistent aggravating factors  Interpretation: Results would suggest a successful diagnostic intervention. The patient has failed to respond to conservative therapies including over-the-counter medications, anti-inflammatories, muscle relaxants, membrane stabilizers, opioids, physical therapy, modalities such as heat and ice, as well as more invasive techniques such as nerve blocks. Because Mr. Torry did attain more than 50%  relief of the pain during a series of diagnostic blocks conducted in separate occasions, I believe it is medically necessary to proceed with Radiofrequency Ablation, in order to attempt gaining longer relief.  Laboratory Chemistry  Inflammation Markers Lab Results  Component Value Date   ESRSEDRATE 6 11/18/2015   CRP <0.5 11/18/2015   Renal Function Lab Results  Component Value Date   BUN 15 11/18/2015   CREATININE 0.80 11/18/2015   GFRAA >60 11/18/2015   GFRNONAA >60 11/18/2015   Hepatic Function Lab Results  Component Value Date   AST 19 11/18/2015   ALT 17 11/18/2015   ALBUMIN 4.1 11/18/2015   Electrolytes Lab Results  Component Value Date   NA 139 11/18/2015   K 4.3 11/18/2015   CL 103 11/18/2015   CALCIUM 9.3 11/18/2015   MG 1.8 11/18/2015   Pain Modulating Vitamins Lab Results  Component Value Date   25OHVITD1 31 11/18/2015   25OHVITD2 <1.0 11/18/2015   25OHVITD3 30 11/18/2015   VITAMINB12 268 11/18/2015   Coagulation Parameters No results found for: INR, LABPROT, APTT, PLT Cardiovascular No results found for: BNP, HGB, HCT Note: Lab results reviewed.  Recent Diagnostic Imaging Review  Dg C-arm 1-60 Min-no Report  Result Date: 04/27/2016 Fluoroscopy was utilized by the requesting physician.  No radiographic interpretation.   Note: Imaging results reviewed.          Meds  The patient has a current medication list which includes the following prescription(s): fifty50 glucose meter 2.0, carisoprodol, digoxin, furosemide, losartan, magnesium oxide, melatonin, metformin, metoprolol succinate, morphine, morphine, morphine, naloxone, oxycodone-acetaminophen, oxycodone-acetaminophen, oxycodone-acetaminophen, rosuvastatin, simvastatin, warfarin, and warfarin.  Current Outpatient Prescriptions on File Prior to Visit  Medication Sig  . Blood Glucose Monitoring Suppl (FIFTY50 GLUCOSE METER 2.0) w/Device KIT   . carisoprodol (SOMA) 350 MG tablet Take 350 mg by  mouth 2 (two) times daily.   . digoxin (DIGOX) 0.125 MG tablet TAKE 1 TABLET BY MOUTH EVERY DAY  . furosemide (LASIX) 20 MG tablet Take 20 mg by mouth as needed.   Marland Kitchen losartan (COZAAR) 50 MG tablet Take 50 mg by mouth daily.   . Magnesium Oxide 500 MG TABS Take 1 tablet by mouth daily as needed.   . Melatonin 5 MG TABS Take 5 mg by mouth daily as needed.   . metFORMIN (GLUCOPHAGE) 500 MG tablet Take 500 mg by mouth daily with breakfast.   . metoprolol succinate (TOPROL-XL) 25 MG 24 hr tablet Take 25 mg by mouth.  . Naloxone HCl (NARCAN) 4 MG/0.1ML LIQD Place 1 spray into the nose once. Spray half of bottle content into each nostril, then call 911  .  rosuvastatin (CRESTOR) 20 MG tablet Take 20 mg by mouth daily.   . simvastatin (ZOCOR) 40 MG tablet Take 40 mg by mouth at bedtime.   Marland Kitchen warfarin (COUMADIN) 3 MG tablet Take 2 mg by mouth. every evening, on Tue & Thu  . warfarin (COUMADIN) 4 MG tablet Take 0.5 tablets (2 mg) by mouth on Tuesday and Thursday. Take 1 tablet (4 mg) by mouth all other days   No current facility-administered medications on file prior to visit.    ROS  Constitutional: Denies any fever or chills Gastrointestinal: No reported hemesis, hematochezia, vomiting, or acute GI distress Musculoskeletal: Denies any acute onset joint swelling, redness, loss of ROM, or weakness Neurological: No reported episodes of acute onset apraxia, aphasia, dysarthria, agnosia, amnesia, paralysis, loss of coordination, or loss of consciousness  Allergies  Mr. Suit is allergic to diltiazem; penicillins; and tizanidine.  PFSH  Drug: Mr. Satz  reports that he does not use drugs. Alcohol:  reports that he does not drink alcohol. Tobacco:  reports that he has been smoking Cigarettes.  He has never used smokeless tobacco. Medical:  has a past medical history of Arthralgia of lower leg (04/02/2012); Arthralgia of upper arm (06/18/2009); Cancer ([place]); Diabetes mellitus without  complication ([place]); FH: atrial fibrillation; and Hypertension. Family: family history includes Dementia in his mother; Heart disease in his father.  Past Surgical History:  Procedure Laterality Date  . bladder cancer surgery  2015  . JOINT REPLACEMENT    . REPLACEMENT TOTAL HIP W/  RESURFACING IMPLANTS Bilateral   . SHOULDER SURGERY Bilateral   . TOTAL KNEE ARTHROPLASTY Bilateral    Constitutional Exam  General appearance: Well nourished, well developed, and well hydrated. In no apparent acute distress Vitals:   05/11/16 0914  BP: (!) 152/65  Pulse: 68  Resp: 18  Temp: 98.2 F (36.8 C)  SpO2: 92%  Weight: 225 lb (102.1 kg)  Height: _0  (1.727 m)   BMI Assessment: Estimated body mass index is 34.21 kg/m as calculated from the following:   Height as of this encounter: _1  (1.727 m).   Weight as of this encounter: 225 lb (102.1 kg).  BMI interpretation table: BMI level Category Range association with higher incidence of chronic pain  <18 kg/m2 Underweight   18.5-24.9 kg/m2 Ideal body weight   25-29.9 kg/m2 Overweight Increased incidence by 20%  30-34.9 kg/m2 Obese (Class I) Increased incidence by 68%  35-39.9 kg/m2 Severe obesity (Class II) Increased incidence by 136%  >40 kg/m2 Extreme obesity (Class III) Increased incidence by 254%   BMI Readings from Last 4 Encounters:  05/11/16 34.21 kg/m  04/24/16 34.21 kg/m  02/07/16 33.45 kg/m  11/17/15 34.52 kg/m   Wt Readings from Last 4 Encounters:  05/11/16 225 lb (102.1 kg)  04/24/16 225 lb (102.1 kg)  02/07/16 220 lb (99.8 kg)  11/17/15 227 lb (103 kg)  Psych/Mental status: Alert, oriented x 3 (person, place, & time) Eyes: PERLA Respiratory: No evidence of acute respiratory distress  Cervical Spine Exam  Inspection: No masses, redness, or swelling Alignment: Symmetrical Functional ROM: Unrestricted ROM Stability: No instability detected Muscle strength & Tone: Functionally intact Sensory:  Unimpaired Palpation: Non-contributory  Upper Extremity (UE) Exam    Side: Right upper extremity  Side: Left upper extremity  Inspection: No masses, redness, swelling, or asymmetry  Inspection: No masses, redness, swelling, or asymmetry  Functional ROM: Unrestricted ROM          Functional ROM: Decreased ROM for shoulder joint  Muscle strength & Tone: Functionally intact  Muscle strength & Tone: Functionally intact  Sensory: Unimpaired  Sensory: Movement-associated pain  Palpation: Non-contributory  Palpation: Complains of area being tender to palpation   Thoracic Spine Exam  Inspection: No masses, redness, or swelling Alignment: Symmetrical Functional ROM: Unrestricted ROM Stability: No instability detected Sensory: Unimpaired Muscle strength & Tone: Functionally intact Palpation: Non-contributory  Lumbar Spine Exam  Inspection: No masses, redness, or swelling Alignment: Symmetrical Functional ROM: Unrestricted ROM Stability: No instability detected Muscle strength & Tone: Functionally intact Sensory: Unimpaired Palpation: Non-contributory Provocative Tests: Lumbar Hyperextension and rotation test: evaluation deferred today       Patrick's Maneuver: evaluation deferred today              Gait & Posture Assessment  Ambulation: Unassisted Gait: Relatively normal for age and body habitus Posture: WNL   Lower Extremity Exam    Side: Right lower extremity  Side: Left lower extremity  Inspection: No masses, redness, swelling, or asymmetry  Inspection: No masses, redness, swelling, or asymmetry  Functional ROM: Unrestricted ROM          Functional ROM: Unrestricted ROM          Muscle strength & Tone: Functionally intact  Muscle strength & Tone: Functionally intact  Sensory: Unimpaired  Sensory: Unimpaired  Palpation: Non-contributory  Palpation: Non-contributory   Assessment  Primary Diagnosis & Pertinent Problem List: The primary encounter diagnosis was Chronic pain  syndrome. Diagnoses of Chronic shoulder pain (Location of Primary Source of Pain) (Bilateral) (L>R), Chronic hip pain (Location of Secondary source of pain) (Bilateral) (R>L), Chronic low back pain (Location of Tertiary source of pain) (Bilateral) (R>L), Long term current use of opiate analgesic, and Opiate use (105 MME/Day) were also pertinent to this visit.  Status Diagnosis   Stable  Stable  Stable 1. Chronic pain syndrome   2. Chronic shoulder pain (Location of Primary Source of Pain) (Bilateral) (L>R)   3. Chronic hip pain (Location of Secondary source of pain) (Bilateral) (R>L)   4. Chronic low back pain (Location of Tertiary source of pain) (Bilateral) (R>L)   5. Long term current use of opiate analgesic   6. Opiate use (105 MME/Day)      Plan of Care  Pharmacotherapy (Medications Ordered): Meds ordered this encounter  Medications  . oxyCODONE-acetaminophen (PERCOCET) 10-325 MG tablet    Sig: Take 1 tablet by mouth every 8 (eight) hours as needed for pain.    Dispense:  90 tablet    Refill:  0    Do not place this medication, or any other prescription from our practice, on "Automatic Refill". Patient may have prescription filled one day early if pharmacy is closed on scheduled refill date. Do not fill until: 06/17/16 To last until: 07/17/16  . oxyCODONE-acetaminophen (PERCOCET) 10-325 MG tablet    Sig: Take 1 tablet by mouth every 8 (eight) hours as needed for pain.    Dispense:  90 tablet    Refill:  0    Do not place this medication, or any other prescription from our practice, on "Automatic Refill". Patient may have prescription filled one day early if pharmacy is closed on scheduled refill date. Do not fill until: 07/17/16 To last until: 08/16/16  . oxyCODONE-acetaminophen (PERCOCET) 10-325 MG tablet    Sig: Take 1 tablet by mouth every 8 (eight) hours as needed for pain.    Dispense:  90 tablet    Refill:  0    Do not place this  medication, or any other prescription  from our practice, on "Automatic Refill". Patient may have prescription filled one day early if pharmacy is closed on scheduled refill date. Do not fill until: 05/18/16 To last until: 06/17/16  . morphine (MS CONTIN) 30 MG 12 hr tablet    Sig: Take 1 tablet (30 mg total) by mouth every 12 (twelve) hours.    Dispense:  60 tablet    Refill:  0    Do not place this medication, or any other prescription from our practice, on "Automatic Refill". Patient may have prescription filled one day early if pharmacy is closed on scheduled refill date. Do not fill until: 06/17/16 To last until: 07/17/16  . morphine (MS CONTIN) 30 MG 12 hr tablet    Sig: Take 1 tablet (30 mg total) by mouth every 12 (twelve) hours.    Dispense:  60 tablet    Refill:  0    Do not place this medication, or any other prescription from our practice, on "Automatic Refill". Patient may have prescription filled one day early if pharmacy is closed on scheduled refill date. Do not fill until: 07/17/16 To last until: 08/16/16  . morphine (MS CONTIN) 30 MG 12 hr tablet    Sig: Take 1 tablet (30 mg total) by mouth every 12 (twelve) hours.    Dispense:  60 tablet    Refill:  0    Do not place this medication, or any other prescription from our practice, on "Automatic Refill". Patient may have prescription filled one day early if pharmacy is closed on scheduled refill date. Do not fill until: 05/18/16 To last until: 06/17/16   New Prescriptions   No medications on file   Medications administered today: Mr. Lowden had no medications administered during this visit. Lab-work, procedure(s), and/or referral(s): No orders of the defined types were placed in this encounter.  Imaging and/or referral(s): None  Interventional therapies: Planned, scheduled, and/or pending:   None at this time.    Considering:   (Stop Coumadin for 5 days prior to procedure) Left sided suprascapular nerve radiofrequency ablation    Palliative  PRN treatment(s):   (Stop Coumadin for 5 days prior to procedure) Suprascapular nerve block  Possible suprascapular nerve radiofrequency ablation    Provider-requested follow-up: Return in about 3 months (around 08/09/2016) for Med-Mgmt, in addition, (PRN) procedure, stop blood thinner before procedure(s).  Future Appointments Date Time Provider [place]  08/08/2016 10:00 AM Milinda Pointer, MD [facility] None   Primary Care Physician: Neomia Dear, MD Location: [facility] Outpatient Pain Management Facility Note by: Kathlen Brunswick. Dossie Arbour, M.D, DABA, DABAPM, DABPM, DABIPP, FIPP Date: 05/11/16; Time: 1:02 PM  Pain Score Disclaimer: We use the NRS-11 scale. This is a self-reported, subjective measurement of pain severity with only modest accuracy. It is used primarily to identify changes within a particular patient. It must be understood that outpatient pain scales are significantly less accurate that those used for research, where they can be applied under ideal controlled circumstances with minimal exposure to variables. In reality, the score is likely to be a combination of pain intensity and pain affect, where pain affect describes the degree of emotional arousal or changes in action readiness caused by the sensory experience of pain. Factors such as social and work situation, setting, emotional state, anxiety levels, expectation, and prior pain experience may influence pain perception and show large inter-individual differences that may also be affected by time variables.  Patient instructions provided during this appointment: Patient Instructions  Steps to Quit Smoking Smoking tobacco can be harmful to your health and can affect almost every organ in your body. Smoking puts you, and those around you, at risk for developing many serious chronic diseases. Quitting smoking is difficult, but it is one of the best things that you can do for your health. It is never too late to quit. What  are the benefits of quitting smoking? When you quit smoking, you lower your risk of developing serious diseases and conditions, such as:  Lung cancer or lung disease, such as COPD.  Heart disease.  Stroke.  Heart attack.  Infertility.  Osteoporosis and bone fractures. Additionally, symptoms such as coughing, wheezing, and shortness of breath may get better when you quit. You may also find that you get sick less often because your body is stronger at fighting off colds and infections. If you are pregnant, quitting smoking can help to reduce your chances of having a baby of low birth weight. How do I get ready to quit? When you decide to quit smoking, create a plan to make sure that you are successful. Before you quit:  Pick a date to quit. Set a date within the next two weeks to give you time to prepare.  Write down the reasons why you are quitting. Keep this list in places where you will see it often, such as on your bathroom mirror or in your car or wallet.  Identify the people, places, things, and activities that make you want to smoke (triggers) and avoid them. Make sure to take these actions:  Throw away all cigarettes at home, at work, and in your car.  Throw away smoking accessories, such as Scientist, research (medical).  Clean your car and make sure to empty the ashtray.  Clean your home, including curtains and carpets.  Tell your family, friends, and coworkers that you are quitting. Support from your loved ones can make quitting easier.  Talk with your health care provider about your options for quitting smoking.  Find out what treatment options are covered by your health insurance. What strategies can I use to quit smoking? Talk with your healthcare provider about different strategies to quit smoking. Some strategies include:  Quitting smoking altogether instead of gradually lessening how much you smoke over a period of time. Research shows that quitting "cold [place]" is  more successful than gradually quitting.  Attending in-person counseling to help you build problem-solving skills. You are more likely to have success in quitting if you attend several counseling sessions. Even short sessions of 10 minutes can be effective.  Finding resources and support systems that can help you to quit smoking and remain smoke-free after you quit. These resources are most helpful when you use them often. They can include:  Online chats with a Social worker.  Telephone quitlines.  Printed Furniture conservator/restorer.  Support groups or group counseling.  Text messaging programs.  Mobile phone applications.  Taking medicines to help you quit smoking. (If you are pregnant or breastfeeding, talk with your health care provider first.) Some medicines contain nicotine and some do not. Both types of medicines help with cravings, but the medicines that include nicotine help to relieve withdrawal symptoms. Your health care provider may recommend:  Nicotine patches, gum, or lozenges.  Nicotine inhalers or sprays.  Non-nicotine medicine that is taken by mouth. Talk with your health care provider about combining strategies, such as taking medicines while you are also receiving in-person counseling. Using these two strategies together makes you  more likely to succeed in quitting than if you used either strategy on its own. If you are pregnant or breastfeeding, talk with your health care provider about finding counseling or other support strategies to quit smoking. Do not take medicine to help you quit smoking unless told to do so by your health care provider. What things can I do to make it easier to quit? Quitting smoking might feel overwhelming at first, but there is a lot that you can do to make it easier. Take these important actions:  Reach out to your family and friends and ask that they support and encourage you during this time. Call telephone quitlines, reach out to support groups, or  work with a counselor for support.  Ask people who smoke to avoid smoking around you.  Avoid places that trigger you to smoke, such as bars, parties, or smoke-break areas at work.  Spend time around people who do not smoke.  Lessen stress in your life, because stress can be a smoking trigger for some people. To lessen stress, try:  Exercising regularly.  Deep-breathing exercises.  Yoga.  Meditating.  Performing a body scan. This involves closing your eyes, scanning your body from head to toe, and noticing which parts of your body are particularly tense. Purposefully relax the muscles in those areas.  Download or purchase mobile phone or tablet apps (applications) that can help you stick to your quit plan by providing reminders, tips, and encouragement. There are many free apps, such as QuitGuide from the [employer] Office manager for Disease Control and Prevention). You can find other support for quitting smoking (smoking cessation) through smokefree.gov and other websites. How will I feel when I quit smoking? Within the first 24 hours of quitting smoking, you may start to feel some withdrawal symptoms. These symptoms are usually most noticeable 2-3 days after quitting, but they usually do not last beyond 2-3 weeks. Changes or symptoms that you might experience include:  Mood swings.  Restlessness, anxiety, or irritation.  Difficulty concentrating.  Dizziness.  Strong cravings for sugary foods in addition to nicotine.  Mild weight gain.  Constipation.  Nausea.  Coughing or a sore throat.  Changes in how your medicines work in your body.  A depressed mood.  Difficulty sleeping (insomnia). After the first 2-3 weeks of quitting, you may start to notice more positive results, such as:  Improved sense of smell and taste.  Decreased coughing and sore throat.  Slower heart rate.  Lower blood pressure.  Clearer skin.  The ability to breathe more easily.  Fewer sick  days. Quitting smoking is very challenging for most people. Do not get discouraged if you are not successful the first time. Some people need to make many attempts to quit before they achieve long-term success. Do your best to stick to your quit plan, and talk with your health care provider if you have any questions or concerns. This information is not intended to replace advice given to you by your health care provider. Make sure you discuss any questions you have with your health care provider. Document Released: 05/09/2001 Document Revised: 01/11/2016 Document Reviewed: 09/29/2014 Elsevier Interactive Patient Education  2017 Sneads were given 3 prescriptions each for Oxycodone and MS Contin.

## 2016-05-11 NOTE — Patient Instructions (Addendum)

## 2016-05-11 NOTE — Progress Notes (Signed)
Nursing Pain Medication Assessment:  Safety precautions to be maintained throughout the outpatient stay will include: orient to surroundings, keep bed in low position, maintain call bell within reach at all times, provide assistance with transfer out of bed and ambulation.  Medication Inspection Compliance: Pill count conducted under aseptic conditions, in front of the patient. Neither the pills nor the bottle was removed from the patient's sight at any time. Once count was completed pills were immediately returned to the patient in their original bottle.  Medication #1: Morphine ER (MSContin) Pill Count: 7 of 60 pills remain Bottle Appearance: Standard pharmacy container. Clearly labeled. Filled Date: 70 / 21 / 2017 Medication last intake: 05-10-16  Medication #2: Oxycodone IR Pill Count: 11 of 90 pills remain Bottle Appearance: Standard pharmacy container. Clearly labeled. Filled Date: 52 / 21 / 2017 Medication last intake: 05-10-16

## 2016-06-07 DIAGNOSIS — Z7901 Long term (current) use of anticoagulants: Secondary | ICD-10-CM | POA: Diagnosis not present

## 2016-07-05 DIAGNOSIS — Z7901 Long term (current) use of anticoagulants: Secondary | ICD-10-CM | POA: Diagnosis not present

## 2016-07-05 DIAGNOSIS — I4891 Unspecified atrial fibrillation: Secondary | ICD-10-CM | POA: Diagnosis not present

## 2016-08-08 ENCOUNTER — Ambulatory Visit: Payer: Medicare Other | Attending: Pain Medicine | Admitting: Pain Medicine

## 2016-08-08 ENCOUNTER — Encounter: Payer: Self-pay | Admitting: Pain Medicine

## 2016-08-08 VITALS — BP 109/59 | HR 66 | Temp 98.0°F | Resp 16 | Ht 68.0 in | Wt 226.0 lb

## 2016-08-08 DIAGNOSIS — G894 Chronic pain syndrome: Secondary | ICD-10-CM | POA: Diagnosis not present

## 2016-08-08 DIAGNOSIS — Z79899 Other long term (current) drug therapy: Secondary | ICD-10-CM | POA: Insufficient documentation

## 2016-08-08 DIAGNOSIS — Z96653 Presence of artificial knee joint, bilateral: Secondary | ICD-10-CM | POA: Insufficient documentation

## 2016-08-08 DIAGNOSIS — Z888 Allergy status to other drugs, medicaments and biological substances status: Secondary | ICD-10-CM | POA: Diagnosis not present

## 2016-08-08 DIAGNOSIS — Z79891 Long term (current) use of opiate analgesic: Secondary | ICD-10-CM

## 2016-08-08 DIAGNOSIS — Z7984 Long term (current) use of oral hypoglycemic drugs: Secondary | ICD-10-CM | POA: Diagnosis not present

## 2016-08-08 DIAGNOSIS — M25511 Pain in right shoulder: Secondary | ICD-10-CM

## 2016-08-08 DIAGNOSIS — M25559 Pain in unspecified hip: Secondary | ICD-10-CM | POA: Diagnosis not present

## 2016-08-08 DIAGNOSIS — E7801 Familial hypercholesterolemia: Secondary | ICD-10-CM | POA: Insufficient documentation

## 2016-08-08 DIAGNOSIS — G8929 Other chronic pain: Secondary | ICD-10-CM

## 2016-08-08 DIAGNOSIS — M542 Cervicalgia: Secondary | ICD-10-CM | POA: Diagnosis not present

## 2016-08-08 DIAGNOSIS — E119 Type 2 diabetes mellitus without complications: Secondary | ICD-10-CM | POA: Diagnosis not present

## 2016-08-08 DIAGNOSIS — M25512 Pain in left shoulder: Secondary | ICD-10-CM

## 2016-08-08 DIAGNOSIS — I10 Essential (primary) hypertension: Secondary | ICD-10-CM | POA: Insufficient documentation

## 2016-08-08 DIAGNOSIS — M545 Low back pain, unspecified: Secondary | ICD-10-CM

## 2016-08-08 DIAGNOSIS — Z7901 Long term (current) use of anticoagulants: Secondary | ICD-10-CM | POA: Insufficient documentation

## 2016-08-08 DIAGNOSIS — I4891 Unspecified atrial fibrillation: Secondary | ICD-10-CM | POA: Diagnosis not present

## 2016-08-08 DIAGNOSIS — F17210 Nicotine dependence, cigarettes, uncomplicated: Secondary | ICD-10-CM | POA: Insufficient documentation

## 2016-08-08 DIAGNOSIS — Z96643 Presence of artificial hip joint, bilateral: Secondary | ICD-10-CM | POA: Diagnosis not present

## 2016-08-08 DIAGNOSIS — Z9889 Other specified postprocedural states: Secondary | ICD-10-CM | POA: Diagnosis not present

## 2016-08-08 DIAGNOSIS — Z880 Allergy status to penicillin: Secondary | ICD-10-CM | POA: Diagnosis not present

## 2016-08-08 DIAGNOSIS — F1190 Opioid use, unspecified, uncomplicated: Secondary | ICD-10-CM

## 2016-08-08 MED ORDER — OXYCODONE HCL 5 MG PO TABS: 5.0000 mg | ORAL_TABLET | Freq: Two times a day (BID) | ORAL | 0 refills | Status: DC

## 2016-08-08 MED ORDER — MORPHINE SULFATE ER 15 MG PO TBCR: 15.0000 mg | EXTENDED_RELEASE_TABLET | Freq: Two times a day (BID) | ORAL | 0 refills | Status: DC

## 2016-08-08 MED ORDER — OXYCODONE HCL 5 MG PO TABS: 5.0000 mg | ORAL_TABLET | Freq: Every day | ORAL | 0 refills | Status: DC

## 2016-08-08 MED ORDER — OXYCODONE HCL 5 MG PO TABS: 5.0000 mg | ORAL_TABLET | Freq: Four times a day (QID) | ORAL | 0 refills | Status: DC

## 2016-08-08 MED ORDER — OXYCODONE HCL 10 MG PO TABS: 10.0000 mg | ORAL_TABLET | Freq: Three times a day (TID) | ORAL | 0 refills | Status: DC | PRN

## 2016-08-08 MED ORDER — OXYCODONE HCL 5 MG PO TABS: 5.0000 mg | ORAL_TABLET | Freq: Three times a day (TID) | ORAL | 0 refills | Status: DC

## 2016-08-08 NOTE — Progress Notes (Signed)
Patient's Name: Aaron Short  MRN: [id number]  Referring Provider: Neomia Dear, MD  DOB: Dec 18, 1945  PCP: Neomia Dear, MD  DOS: 08/08/2016  Note by: Kathlen Brunswick. Dossie Arbour, MD  Service setting: Ambulatory outpatient  Specialty: Interventional Pain Management  Location: [facility] (AMB) Pain Management Facility    Patient type: Established   Primary Reason(s) for Visit: Encounter for prescription drug management (Level of risk: moderate) CC: No chief complaint on file.  HPI  Aaron Short is a 71 y.o. year old, male patient, who comes today for a medication management evaluation. He has Long term current use of opiate analgesic; Long term prescription opiate use; Opiate use (105 MME/Day); Encounter for therapeutic drug level monitoring; Encounter for chronic pain management; Opioid dependence, daily use ([place]); Chronic hip pain (Location of Secondary source of pain) (Bilateral) (R>L); Chronic shoulder pain (Location of Primary Source of Pain) (Bilateral) (L>R); Chronic low back pain (Location of Tertiary source of pain) (Bilateral) (R>L); Chronic knee pain (Bilateral) (R>L); S/P THR: total hip replacement (Bilateral); S/P TKR: total knee replacement (Bilateral); Adiposity; Atrial fibrillation ([place]); Malignant neoplasm of urinary bladder ([place]); Type 2 diabetes mellitus ([place]); Essential (primary) hypertension; Muscle spasticity; Long term current use of anticoagulant; Combined fat and carbohydrate induced hyperlipemia; Pure hypercholesterolemia; History of bladder cancer; History of hip fracture; History of pelvic fracture; History of anticoagulant therapy; Chronic neck pain; Chondrocalcinosis; Radicular pain of shoulder; History of shoulder surgery; Myofascial pain; Musculoskeletal pain; Muscle spasm; Lumbar facet syndrome (Bilateral) (R>L); Lumbar spondylosis; Morbid obesity ([place]); Continuous opioid dependence ([place]); Disturbance of skin sensation; Malignant neoplasm of bladder ([place]); Smoker; FH:  atrial fibrillation; Malignant neoplasm of overlapping sites of bladder ([place]); Obesity, Class I, BMI 30-34.9; and Chronic pain syndrome on his problem list. His primarily concern today is the No chief complaint on file.  Pain Assessment: Self-Reported Pain Score: 2 /10             Reported level is compatible with observation.       Pain Type: Chronic pain Pain Location: Shoulder Pain Orientation: Left ("Top of shoulder" per patient) Pain Descriptors / Indicators: Stabbing, Constant Pain Frequency: Constant  Aaron Short was last scheduled for an appointment on 05/11/2016 for medication management. During today's appointment we reviewed Aaron Short chronic pain status, as well as his outpatient medication regimen.  The patient  reports that he does not use drugs. His body mass index is 34.36 kg/m.  Further details on both, my assessment(s), as well as the proposed treatment plan, please see below.  Controlled Substance Pharmacotherapy Assessment REMS (Risk Evaluation and Mitigation Strategy)  Analgesic:MS Contin 30 mg every 12 hours (60 mg/day) plus oxycodone/APAP 10/325 every 8 hours (30 mg/day) MME/day:105 mg/day  Angelique Holm, RN  08/08/2016 10:19 AM  Sign at close encounter Nursing Pain Medication Assessment:  Safety precautions to be maintained throughout the outpatient stay will include: orient to surroundings, keep bed in low position, maintain call bell within reach at all times, provide assistance with transfer out of bed and ambulation.  Medication Inspection Compliance: Pill count conducted under aseptic conditions, in front of the patient. Neither the pills nor the bottle was removed from the patient's sight at any time. Once count was completed pills were immediately returned to the patient in their original bottle.  Medication #1: Oxycodone IR Pill/Patch Count: 20 of 90 pills remain Bottle Appearance: Standard pharmacy container. Clearly labeled. Filled  Date: 02 / 19 / 2018 Last Medication intake:  Today  Medication #2: Morphine ER (MSContin) Pill/Patch  Count: 13 of 60 pills remain Bottle Appearance: Standard pharmacy container. Clearly labeled. Filled Date: 02 / 19 / 2018 Last Medication intake:  Today   Pharmacokinetics: Liberation and absorption (onset of action): WNL Distribution (time to peak effect): WNL Metabolism and excretion (duration of action): WNL         Pharmacodynamics: Desired effects: Analgesia: Aaron Short reports >50% benefit. Functional ability: Patient reports that medication allows him to accomplish basic ADLs Clinically meaningful improvement in function (CMIF): Sustained CMIF goals met Perceived effectiveness: Described as relatively effective, allowing for increase in activities of daily living (ADL) Undesirable effects: Side-effects or Adverse reactions: None reported Monitoring: [place] PMP: Online review of the past 61-monthperiod conducted. Compliant with practice rules and regulations List of all UDS test(s) done:  Lab Results  Component Value Date   TOXASSSELUR FINAL 02/07/2016   TOXASSSELUR FINAL 11/17/2015   TKalidaFINAL 08/16/2015   Last UDS on record: ToxAssure Select 13  Date Value Ref Range Status  02/07/2016 FINAL  Final    Comment:    ==================================================================== TOXASSURE SELECT 13 (MW) ==================================================================== Test                             Result       Flag       Units Drug Present and Declared for Prescription Verification   Morphine                       >7143        EXPECTED   ng/mg creat    Potential sources of large amounts of morphine in the absence of    codeine include administration of morphine or use of heroin.   Hydromorphone                  36           EXPECTED   ng/mg creat    Hydromorphone may be present as a metabolite of morphine;    concentrations of hydromorphone  rarely exceed 5% of the morphine    concentration when this is the source of hydromorphone.   Oxycodone                      659          EXPECTED   ng/mg creat   Oxymorphone                    1874         EXPECTED   ng/mg creat   Noroxycodone                   1891         EXPECTED   ng/mg creat   Noroxymorphone                 401          EXPECTED   ng/mg creat    Sources of oxycodone are scheduled prescription medications.    Oxymorphone, noroxycodone, and noroxymorphone are expected    metabolites of oxycodone. Oxymorphone is also available as a    scheduled prescription medication. ==================================================================== Test                      Result    Flag   Units      Ref Range   Creatinine  140              mg/dL      >=20 ==================================================================== Declared Medications:  The flagging and interpretation on this report are based on the  following declared medications.  Unexpected results may arise from  inaccuracies in the declared medications.  **Note: The testing scope of this panel includes these medications:  Morphine (MS Contin)  Oxycodone (Oxycodone Acetaminophen)  **Note: The testing scope of this panel does not include following  reported medications:  Acetaminophen (Oxycodone Acetaminophen)  Carisoprodol (Soma)  Digoxin (Lanoxin)  Furosemide (Lasix)  Losartan (Cozaar)  Metformin (Glucophage)  Metoprolol (Toprol)  Naloxone  Simvastatin (Zocor)  Warfarin (Coumadin) ==================================================================== For clinical consultation, please call [phone]. ====================================================================    UDS interpretation: Compliant          Medication Assessment Form: Reviewed. Patient indicates being compliant with therapy Treatment compliance: Compliant Risk Assessment Profile: Aberrant behavior: See prior  evaluations. None observed or detected today Comorbid factors increasing risk of overdose: See prior notes. No additional risks detected today Risk of substance use disorder (SUD): Low Opioid Risk Tool (ORT) Total Score: 0  Interpretation Table:  Score <3 = Low Risk for SUD  Score between 4-7 = Moderate Risk for SUD  Score >8 = High Risk for Opioid Abuse   Risk Mitigation Strategies:  Patient Counseling: Covered Patient-Prescriber Agreement (PPA): Present and active  Notification to other healthcare providers: Done  Pharmacologic Plan: No change in therapy, at this time  Laboratory Chemistry  Inflammation Markers Lab Results  Component Value Date   CRP <0.5 11/18/2015   ESRSEDRATE 6 11/18/2015   (CRP: Acute Phase) (ESR: Chronic Phase) Renal Function Markers Lab Results  Component Value Date   BUN 15 11/18/2015   CREATININE 0.80 11/18/2015   GFRAA >60 11/18/2015   GFRNONAA >60 11/18/2015   Hepatic Function Markers Lab Results  Component Value Date   AST 19 11/18/2015   ALT 17 11/18/2015   ALBUMIN 4.1 11/18/2015   ALKPHOS 68 11/18/2015   Electrolytes Lab Results  Component Value Date   NA 139 11/18/2015   K 4.3 11/18/2015   CL 103 11/18/2015   CALCIUM 9.3 11/18/2015   MG 1.8 11/18/2015   Neuropathy Markers Lab Results  Component Value Date   VITAMINB12 268 11/18/2015   Bone Pathology Markers Lab Results  Component Value Date   ALKPHOS 68 11/18/2015   25OHVITD1 31 11/18/2015   25OHVITD2 <1.0 11/18/2015   25OHVITD3 30 11/18/2015   CALCIUM 9.3 11/18/2015   Coagulation Parameters No results found for: INR, LABPROT, APTT, PLT Cardiovascular Markers No results found for: BNP, HGB, HCT Note: Lab results reviewed.  Recent Diagnostic Imaging Review  Dg C-arm 1-60 Min-no Report  Result Date: 04/27/2016 Fluoroscopy was utilized by the requesting physician.  No radiographic interpretation.   Note: Imaging results reviewed.          Meds  The patient  has a current medication list which includes the following prescription(s): carisoprodol, digoxin, furosemide, losartan, magnesium oxide, melatonin, metformin, metoprolol succinate, naloxone, rosuvastatin, simvastatin, warfarin, warfarin, morphine, oxycodone, oxycodone, oxycodone, oxycodone, oxycodone, and oxycodone hcl.  Current Outpatient Prescriptions on File Prior to Visit  Medication Sig   carisoprodol (SOMA) 350 MG tablet Take 350 mg by mouth 2 (two) times daily.    digoxin (DIGOX) 0.125 MG tablet TAKE 1 TABLET BY MOUTH EVERY DAY   furosemide (LASIX) 20 MG tablet Take 20 mg by mouth as needed.    losartan (COZAAR) 50 MG  tablet Take 50 mg by mouth daily.    Magnesium Oxide 500 MG TABS Take 1 tablet by mouth daily as needed.    Melatonin 5 MG TABS Take 5 mg by mouth daily as needed.    metFORMIN (GLUCOPHAGE) 500 MG tablet Take 500 mg by mouth daily with breakfast.    metoprolol succinate (TOPROL-XL) 25 MG 24 hr tablet Take 25 mg by mouth.   Naloxone HCl (NARCAN) 4 MG/0.1ML LIQD Place 1 spray into the nose once. Spray half of bottle content into each nostril, then call 911   rosuvastatin (CRESTOR) 20 MG tablet Take 20 mg by mouth daily.    simvastatin (ZOCOR) 40 MG tablet Take 40 mg by mouth at bedtime.    warfarin (COUMADIN) 3 MG tablet Take 2 mg by mouth. every evening, on Tue & Thu   warfarin (COUMADIN) 4 MG tablet Take 0.5 tablets (2 mg) by mouth on Tuesday and Thursday. Take 1 tablet (4 mg) by mouth all other days   No current facility-administered medications on file prior to visit.    ROS  Constitutional: Denies any fever or chills Gastrointestinal: No reported hemesis, hematochezia, vomiting, or acute GI distress Musculoskeletal: Denies any acute onset joint swelling, redness, loss of ROM, or weakness Neurological: No reported episodes of acute onset apraxia, aphasia, dysarthria, agnosia, amnesia, paralysis, loss of coordination, or loss of consciousness  Allergies   Mr. Smola is allergic to diltiazem; penicillins; and tizanidine.  PFSH  Drug: Mr. Summons  reports that he does not use drugs. Alcohol:  reports that he does not drink alcohol. Tobacco:  reports that he has been smoking Cigarettes.  He has never used smokeless tobacco. Medical:  has a past medical history of Arthralgia of lower leg (04/02/2012); Arthralgia of upper arm (06/18/2009); Cancer ([place]); Diabetes mellitus without complication ([place]); FH: atrial fibrillation; and Hypertension. Family: family history includes Dementia in his mother; Heart disease in his father.  Past Surgical History:  Procedure Laterality Date   bladder cancer surgery  2015   JOINT REPLACEMENT     REPLACEMENT TOTAL HIP W/  RESURFACING IMPLANTS Bilateral    SHOULDER SURGERY Bilateral    TOTAL KNEE ARTHROPLASTY Bilateral    Constitutional Exam  General appearance: Well nourished, well developed, and well hydrated. In no apparent acute distress Vitals:   08/08/16 0951 08/08/16 0957  BP: (!) 109/59   Pulse: 66   Resp: 16   Temp: 98 F (36.7 C)   TempSrc: Axillary   SpO2:  94%  Weight: 226 lb (102.5 kg)   Height: '5\' 8"'  (1.727 m)    BMI Assessment: Estimated body mass index is 34.36 kg/m as calculated from the following:   Height as of this encounter: '5\' 8"'  (1.727 m).   Weight as of this encounter: 226 lb (102.5 kg).  BMI interpretation table: BMI level Category Range association with higher incidence of chronic pain  <18 kg/m2 Underweight   18.5-24.9 kg/m2 Ideal body weight   25-29.9 kg/m2 Overweight Increased incidence by 20%  30-34.9 kg/m2 Obese (Class I) Increased incidence by 68%  35-39.9 kg/m2 Severe obesity (Class II) Increased incidence by 136%  >40 kg/m2 Extreme obesity (Class III) Increased incidence by 254%   BMI Readings from Last 4 Encounters:  08/08/16 34.36 kg/m  05/11/16 34.21 kg/m  04/24/16 34.21 kg/m  02/07/16 33.45 kg/m   Wt Readings from Last 4 Encounters:   08/08/16 226 lb (102.5 kg)  05/11/16 225 lb (102.1 kg)  04/24/16 225 lb (102.1 kg)  02/07/16 220 lb (99.8 kg)  Psych/Mental status: Alert, oriented x 3 (person, place, & time)       Eyes: PERLA Respiratory: No evidence of acute respiratory distress  Cervical Spine Exam  Inspection: No masses, redness, or swelling Alignment: Symmetrical Functional ROM: Unrestricted ROM Stability: No instability detected Muscle strength & Tone: Functionally intact Sensory: Unimpaired Palpation: Non-contributory  Upper Extremity (UE) Exam    Side: Right upper extremity  Side: Left upper extremity  Inspection: No masses, redness, swelling, or asymmetry. No contractures  Inspection: No masses, redness, swelling, or asymmetry. No contractures  Functional ROM: Unrestricted ROM          Functional ROM: Unrestricted ROM          Muscle strength & Tone: Functionally intact  Muscle strength & Tone: Functionally intact  Sensory: Unimpaired  Sensory: Unimpaired  Palpation: Euthermic  Palpation: Euthermic  Specialized Test(s): Deferred         Specialized Test(s): Deferred          Thoracic Spine Exam  Inspection: No masses, redness, or swelling Alignment: Symmetrical Functional ROM: Unrestricted ROM Stability: No instability detected Sensory: Unimpaired Muscle strength & Tone: Functionally intact Palpation: Non-contributory  Lumbar Spine Exam  Inspection: No masses, redness, or swelling Alignment: Symmetrical Functional ROM: Unrestricted ROM Stability: No instability detected Muscle strength & Tone: Functionally intact Sensory: Unimpaired Palpation: Non-contributory Provocative Tests: Lumbar Hyperextension and rotation test: evaluation deferred today       Patrick's Maneuver: evaluation deferred today              Gait & Posture Assessment  Ambulation: Unassisted Gait: Relatively normal for age and body habitus Posture: WNL   Lower Extremity Exam    Side: Right lower extremity  Side: Left  lower extremity  Inspection: No masses, redness, swelling, or asymmetry. No contractures  Inspection: No masses, redness, swelling, or asymmetry. No contractures  Functional ROM: Unrestricted ROM          Functional ROM: Unrestricted ROM          Muscle strength & Tone: Functionally intact  Muscle strength & Tone: Functionally intact  Sensory: Unimpaired  Sensory: Unimpaired  Palpation: No palpable anomalies  Palpation: No palpable anomalies   Assessment  Primary Diagnosis & Pertinent Problem List: The primary encounter diagnosis was Chronic pain syndrome. Diagnoses of Chronic shoulder pain (Location of Primary Source of Pain) (Bilateral) (L>R), Chronic hip pain (Location of Secondary source of pain) (Bilateral) (R>L), Chronic low back pain (Location of Tertiary source of pain) (Bilateral) (R>L), Long term prescription opiate use, and Opiate use (105 MME/Day) were also pertinent to this visit.  Status Diagnosis  Controlled Controlled Controlled 1. Chronic pain syndrome   2. Chronic shoulder pain (Location of Primary Source of Pain) (Bilateral) (L>R)   3. Chronic hip pain (Location of Secondary source of pain) (Bilateral) (R>L)   4. Chronic low back pain (Location of Tertiary source of pain) (Bilateral) (R>L)   5. Long term prescription opiate use   6. Opiate use (105 MME/Day)      Plan of Care  Pharmacotherapy (Medications Ordered): Meds ordered this encounter  Medications   DISCONTD: morphine (MS CONTIN) 15 MG 12 hr tablet    Sig: Take 1 tablet (15 mg total) by mouth every 12 (twelve) hours.    Dispense:  60 tablet    Refill:  0    Fill one day early if pharmacy is closed on scheduled refill date. Do not fill until: 08/16/16 To last  until: 09/15/16   DISCONTD: morphine (MS CONTIN) 15 MG 12 hr tablet    Sig: Take 1 tablet (15 mg total) by mouth every 12 (twelve) hours.    Dispense:  60 tablet    Refill:  0    Fill one day early if pharmacy is closed on scheduled refill date. Do  not fill until: 08/16/16 To last until: 09/15/16   oxyCODONE (OXY IR/ROXICODONE) 5 MG immediate release tablet    Sig: Take 1 tablet (5 mg total) by mouth 5 (five) times daily. Max: 5/day    Dispense:  35 tablet    Refill:  0    This is an opioid taper. Provide patient with medication in the exact order and day as requested. Patient may have prescription filled one day early if pharmacy is closed on scheduled refill date. Do not fill until: 08/16/16 To last until: 08/23/16   oxyCODONE (OXY IR/ROXICODONE) 5 MG immediate release tablet    Sig: Take 1 tablet (5 mg total) by mouth 4 (four) times daily. Max: 4/day    Dispense:  28 tablet    Refill:  0    This is an opioid taper. Provide patient with medication in the exact order and day as requested. Patient may have prescription filled one day early if pharmacy is closed on scheduled refill date. Do not fill until: 08/23/16 To last until: 08/30/16   oxyCODONE (OXY IR/ROXICODONE) 5 MG immediate release tablet    Sig: Take 1 tablet (5 mg total) by mouth 3 (three) times daily. Max: 3/day    Dispense:  21 tablet    Refill:  0    This is an opioid taper. Provide patient with medication in the exact order and day as requested. Patient may have prescription filled one day early if pharmacy is closed on scheduled refill date. Do not fill until: 08/30/16 To last until: 09/06/16   oxyCODONE (OXY IR/ROXICODONE) 5 MG immediate release tablet    Sig: Take 1 tablet (5 mg total) by mouth 2 (two) times daily. Max: 2/day    Dispense:  14 tablet    Refill:  0    This is an opioid taper. Provide patient with medication in the exact order and day as requested. Patient may have prescription filled one day early if pharmacy is closed on scheduled refill date. Do not fill until: 09/06/16 To last until: 09/13/16   oxyCODONE (OXY IR/ROXICODONE) 5 MG immediate release tablet    Sig: Take 1 tablet (5 mg total) by mouth daily. Max: 1/day    Dispense:  7 tablet     Refill:  0    This is an opioid taper. Provide patient with medication in the exact order and day as requested. Patient may have prescription filled one day early if pharmacy is closed on scheduled refill date. Do not fill until: 09/13/16 To last until: 09/20/16   Oxycodone HCl 10 MG TABS    Sig: Take 1 tablet (10 mg total) by mouth every 8 (eight) hours as needed.    Dispense:  90 tablet    Refill:  0    Do not place this medication, or any other prescription from our practice, on "Automatic Refill". Patient may have prescription filled one day early if pharmacy is closed on scheduled refill date. Do not fill until: 08/16/16 To last until: 10/04/16   morphine (MS CONTIN) 15 MG 12 hr tablet    Sig: Take 1 tablet (15 mg total) by mouth every 12 (  twelve) hours.    Dispense:  60 tablet    Refill:  0    Fill one day early if pharmacy is closed on scheduled refill date. Do not fill until: 08/16/16 To last until: 10/04/16   New Prescriptions   MORPHINE (MS CONTIN) 15 MG 12 HR TABLET    Take 1 tablet (15 mg total) by mouth every 12 (twelve) hours.   OXYCODONE (OXY IR/ROXICODONE) 5 MG IMMEDIATE RELEASE TABLET    Take 1 tablet (5 mg total) by mouth 5 (five) times daily. Max: 5/day   OXYCODONE (OXY IR/ROXICODONE) 5 MG IMMEDIATE RELEASE TABLET    Take 1 tablet (5 mg total) by mouth 4 (four) times daily. Max: 4/day   OXYCODONE (OXY IR/ROXICODONE) 5 MG IMMEDIATE RELEASE TABLET    Take 1 tablet (5 mg total) by mouth 3 (three) times daily. Max: 3/day   OXYCODONE (OXY IR/ROXICODONE) 5 MG IMMEDIATE RELEASE TABLET    Take 1 tablet (5 mg total) by mouth 2 (two) times daily. Max: 2/day   OXYCODONE (OXY IR/ROXICODONE) 5 MG IMMEDIATE RELEASE TABLET    Take 1 tablet (5 mg total) by mouth daily. Max: 1/day   OXYCODONE HCL 10 MG TABS    Take 1 tablet (10 mg total) by mouth every 8 (eight) hours as needed.   Medications administered today: Mr. Brenton had no medications administered during this  visit. Lab-work, procedure(s), and/or referral(s): No orders of the defined types were placed in this encounter.  Imaging and/or referral(s): None  Interventional therapies: Planned, scheduled, and/or pending:   Not at this time.   Considering:   (Stop Coumadin for 5 days prior to procedure) Left sided suprascapular nerve radiofrequency ablation    Palliative PRN treatment(s):   (Stop Coumadin for 5 days prior to procedure) Suprascapular nerve block  Possible suprascapular nerve radiofrequency ablation    Provider-requested follow-up: Return in about 1 month (around 09/08/2016) for (MD) Med-Mgmt.  No future appointments. Primary Care Physician: Neomia Dear, MD Location: [facility] Note by: Kathlen Brunswick. Dossie Arbour, M.D, DABA, DABAPM, DABPM, DABIPP, FIPP Date: 08/08/2016; Time: 10:55 AM  Pain Score Disclaimer: We use the NRS-11 scale. This is a self-reported, subjective measurement of pain severity with only modest accuracy. It is used primarily to identify changes within a particular patient. It must be understood that outpatient pain scales are significantly less accurate that those used for research, where they can be applied under ideal controlled circumstances with minimal exposure to variables. In reality, the score is likely to be a combination of pain intensity and pain affect, where pain affect describes the degree of emotional arousal or changes in action readiness caused by the sensory experience of pain. Factors such as social and work situation, setting, emotional state, anxiety levels, expectation, and prior pain experience may influence pain perception and show large inter-individual differences that may also be affected by time variables.  Patient instructions provided during this appointment: Patient Instructions   Pain Score  Introduction: The pain score used by this practice is the Verbal Numerical Rating Scale (VNRS-11). This is an  11-point scale. It is for adults and children 10 years or older. There are significant differences in how the pain score is reported, used, and applied. Forget everything you learned in the past and learn this scoring system.  General Information: The scale should reflect your current level of pain. Unless you are specifically asked for the level of your worst pain, or your average pain. If you are  asked for one of these two, then it should be understood that it is over the past 24 hours.  Basic Activities of Daily Living (ADL): Personal hygiene, dressing, eating, transferring, and using restroom.  Instructions: Most patients tend to report their level of pain as a combination of two factors, their physical pain and their psychosocial pain. This last one is also known as suffering and it is reflection of how physical pain affects you socially and psychologically. From now on, report them separately. From this point on, when asked to report your pain level, report only your physical pain. Use the following table for reference.  Pain Clinic Pain Levels (0-5/10)  Pain Level Score Description  No Pain 0   Mild pain 1 Nagging, annoying, but does not interfere with basic activities of daily living (ADL). Patients are able to eat, bathe, get dressed, toileting (being able to get on and off the toilet and perform personal hygiene functions), transfer (move in and out of bed or a chair without assistance), and maintain continence (able to control bladder and bowel functions). Blood pressure and heart rate are unaffected. A normal heart rate for a healthy adult ranges from 60 to 100 bpm (beats per minute).   Mild to moderate pain 2 Noticeable and distracting. Impossible to hide from other people. More frequent flare-ups. Still possible to adapt and function close to normal. It can be very annoying and may have occasional stronger flare-ups. With discipline, patients may get used to it and adapt.   Moderate  pain 3 Interferes significantly with activities of daily living (ADL). It becomes difficult to feed, bathe, get dressed, get on and off the toilet or to perform personal hygiene functions. Difficult to get in and out of bed or a chair without assistance. Very distracting. With effort, it can be ignored when deeply involved in activities.   Moderately severe pain 4 Impossible to ignore for more than a few minutes. With effort, patients may still be able to manage work or participate in some social activities. Very difficult to concentrate. Signs of autonomic nervous system discharge are evident: dilated pupils (mydriasis); mild sweating (diaphoresis); sleep interference. Heart rate becomes elevated (>115 bpm). Diastolic blood pressure (lower number) rises above 100 mmHg. Patients find relief in laying down and not moving.   Severe pain 5 Intense and extremely unpleasant. Associated with frowning face and frequent crying. Pain overwhelms the senses.  Ability to do any activity or maintain social relationships becomes significantly limited. Conversation becomes difficult. Pacing back and forth is common, as getting into a comfortable position is nearly impossible. Pain wakes you up from deep sleep. Physical signs will be obvious: pupillary dilation; increased sweating; goosebumps; brisk reflexes; cold, clammy hands and feet; nausea, vomiting or dry heaves; loss of appetite; significant sleep disturbance with inability to fall asleep or to remain asleep. When persistent, significant weight loss is observed due to the complete loss of appetite and sleep deprivation.  Blood pressure and heart rate becomes significantly elevated. Caution: If elevated blood pressure triggers a pounding headache associated with blurred vision, then the patient should immediately seek attention at an urgent or emergency care unit, as these may be signs of an impending stroke.    Emergency Department Pain Levels (6-10/10)  Emergency  Room Pain 6 Severely limiting. Requires emergency care and should not be seen or managed at an outpatient pain management facility. Communication becomes difficult and requires great effort. Assistance to reach the emergency department may be required. Facial flushing  and profuse sweating along with potentially dangerous increases in heart rate and blood pressure will be evident.   Distressing pain 7 Self-care is very difficult. Assistance is required to transport, or use restroom. Assistance to reach the emergency department will be required. Tasks requiring coordination, such as bathing and getting dressed become very difficult.   Disabling pain 8 Self-care is no longer possible. At this level, pain is disabling. The individual is unable to do even the most basic activities such as walking, eating, bathing, dressing, transferring to a bed, or toileting. Fine motor skills are lost. It is difficult to think clearly.   Incapacitating pain 9 Pain becomes incapacitating. Thought processing is no longer possible. Difficult to remember your own name. Control of movement and coordination are lost.   The worst pain imaginable 10 At this level, most patients pass out from pain. When this level is reached, collapse of the autonomic nervous system occurs, leading to a sudden drop in blood pressure and heart rate. This in turn results in a temporary and dramatic drop in blood flow to the brain, leading to a loss of consciousness. Fainting is one of the bodys self defense mechanisms. Passing out puts the brain in a calmed state and causes it to shut down for a while, in order to begin the healing process.    Summary: 1. Refer to this scale when providing [place] with your pain level. 2. Be accurate and careful when reporting your pain level. This will help with your care. 3. Over-reporting your pain level will lead to loss of credibility. 4. Even a level of 1/10 means that there is pain and will be treated at our  facility. 5. High, inaccurate reporting will be documented as Symptom Exaggeration, leading to loss of credibility and suspicions of possible secondary gains such as obtaining more narcotics, or wanting to appear disabled, for fraudulent reasons. 6. Only pain levels of 5 or below will be seen at our facility. 7. Pain levels of 6 and above will be sent to the Emergency Department and the appointment cancelled. _____________________________________________________________________________________________  DRUG HOLIDAYS  Definitions Tolerance: defined as the progressively decreased responsiveness to a drug. Occurs when the drug is used repeatedly and the body adapts to the continued presence of the drug. As a result, a larger dose of the drug is needed to achieve the effect originally obtained by a smaller dose. It is thought to be due to the formation of excess opioid receptors.  Drug Holiday: is when a patient stops taking a medication(s) for a period of time; anywhere from a few days to several weeks.  Withdrawals: refers to the wide range of symptoms that occur after stopping or dramatically reducing opiate drugs after heavy and prolonged use. Withdrawal symptoms do not occur to patients that use low dose opioids, or those who take the medication sporadically. Contrary to benzodiazepine (example: Valium, Xanax, etc.) or alcohol withdrawals (Delirium Tremens), opioid withdrawals are not lethal. Withdrawals are the physical manifestation of the body getting rid of the excess receptors.  Purpose To eliminate tolerance.  Duration of Holiday 14 consecutive days. (2 weeks)  Expected Symptoms Early symptoms of withdrawal include:  Agitation  Anxiety  Muscle aches  Increased tearing  Insomnia  Runny nose  Sweating  Yawning  Late symptoms of withdrawal include:  Abdominal cramping  Diarrhea  Dilated pupils  Goose bumps  Nausea  Vomiting  Opioid withdrawal reactions  are very uncomfortable but are not life-threatening. Symptoms usually start within 12 hours  of last opioid dose and within 30 hours of last methadone exposure.  Duration of Symptoms 48 to 72 hours for short acting medications and 2 to 14 days for methadone.  Treatment  Clonidine (Catapres) or tizanidine (Zanaflex) for agitation, sweating, tearing, runny nose.  Promethazine (Phenergan) for nausea, vomiting.  NSAIDs for pain.  Benefits  Improved effectiveness of opioids.  Decreased opioid dose needed to achieve benefits.  Improved pain with lesser dose.

## 2016-08-08 NOTE — Progress Notes (Signed)
Nursing Pain Medication Assessment:  Safety precautions to be maintained throughout the outpatient stay will include: orient to surroundings, keep bed in low position, maintain call bell within reach at all times, provide assistance with transfer out of bed and ambulation.  Medication Inspection Compliance: Pill count conducted under aseptic conditions, in front of the patient. Neither the pills nor the bottle was removed from the patient's sight at any time. Once count was completed pills were immediately returned to the patient in their original bottle.  Medication #1: Oxycodone IR Pill/Patch Count: 20 of 90 pills remain Bottle Appearance: Standard pharmacy container. Clearly labeled. Filled Date: 02 / 19 / 2018 Last Medication intake:  Today  Medication #2: Morphine ER (MSContin) Pill/Patch Count: 13 of 60 pills remain Bottle Appearance: Standard pharmacy container. Clearly labeled. Filled Date: 02 / 19 / 2018 Last Medication intake:  Today

## 2016-08-08 NOTE — Patient Instructions (Signed)
Pain Score  Introduction: The pain score used by this practice is the Verbal Numerical Rating Scale (VNRS-11). This is an 11-point scale. It is for adults and children 10 years or older. There are significant differences in how the pain score is reported, used, and applied. Forget everything you learned in the past and learn this scoring system.  General Information: The scale should reflect your current level of pain. Unless you are specifically asked for the level of your worst pain, or your average pain. If you are asked for one of these two, then it should be understood that it is over the past 24 hours.  Basic Activities of Daily Living (ADL): Personal hygiene, dressing, eating, transferring, and using restroom.  Instructions: Most patients tend to report their level of pain as a combination of two factors, their physical pain and their psychosocial pain. This last one is also known as suffering and it is reflection of how physical pain affects you socially and psychologically. From now on, report them separately. From this point on, when asked to report your pain level, report only your physical pain. Use the following table for reference.  Pain Clinic Pain Levels (0-5/10)  Pain Level Score Description  No Pain 0   Mild pain 1 Nagging, annoying, but does not interfere with basic activities of daily living (ADL). Patients are able to eat, bathe, get dressed, toileting (being able to get on and off the toilet and perform personal hygiene functions), transfer (move in and out of bed or a chair without assistance), and maintain continence (able to control bladder and bowel functions). Blood pressure and heart rate are unaffected. A normal heart rate for a healthy adult ranges from 60 to 100 bpm (beats per minute).   Mild to moderate pain 2 Noticeable and distracting. Impossible to hide from other people. More frequent flare-ups. Still possible to adapt and function close to normal. It can be very  annoying and may have occasional stronger flare-ups. With discipline, patients may get used to it and adapt.   Moderate pain 3 Interferes significantly with activities of daily living (ADL). It becomes difficult to feed, bathe, get dressed, get on and off the toilet or to perform personal hygiene functions. Difficult to get in and out of bed or a chair without assistance. Very distracting. With effort, it can be ignored when deeply involved in activities.   Moderately severe pain 4 Impossible to ignore for more than a few minutes. With effort, patients may still be able to manage work or participate in some social activities. Very difficult to concentrate. Signs of autonomic nervous system discharge are evident: dilated pupils (mydriasis); mild sweating (diaphoresis); sleep interference. Heart rate becomes elevated (>115 bpm). Diastolic blood pressure (lower number) rises above 100 mmHg. Patients find relief in laying down and not moving.   Severe pain 5 Intense and extremely unpleasant. Associated with frowning face and frequent crying. Pain overwhelms the senses.  Ability to do any activity or maintain social relationships becomes significantly limited. Conversation becomes difficult. Pacing back and forth is common, as getting into a comfortable position is nearly impossible. Pain wakes you up from deep sleep. Physical signs will be obvious: pupillary dilation; increased sweating; goosebumps; brisk reflexes; cold, clammy hands and feet; nausea, vomiting or dry heaves; loss of appetite; significant sleep disturbance with inability to fall asleep or to remain asleep. When persistent, significant weight loss is observed due to the complete loss of appetite and sleep deprivation.  Blood pressure and heart  rate becomes significantly elevated. Caution: If elevated blood pressure triggers a pounding headache associated with blurred vision, then the patient should immediately seek attention at an urgent or  emergency care unit, as these may be signs of an impending stroke.    Emergency Department Pain Levels (6-10/10)  Emergency Room Pain 6 Severely limiting. Requires emergency care and should not be seen or managed at an outpatient pain management facility. Communication becomes difficult and requires great effort. Assistance to reach the emergency department may be required. Facial flushing and profuse sweating along with potentially dangerous increases in heart rate and blood pressure will be evident.   Distressing pain 7 Self-care is very difficult. Assistance is required to transport, or use restroom. Assistance to reach the emergency department will be required. Tasks requiring coordination, such as bathing and getting dressed become very difficult.   Disabling pain 8 Self-care is no longer possible. At this level, pain is disabling. The individual is unable to do even the most basic activities such as walking, eating, bathing, dressing, transferring to a bed, or toileting. Fine motor skills are lost. It is difficult to think clearly.   Incapacitating pain 9 Pain becomes incapacitating. Thought processing is no longer possible. Difficult to remember your own name. Control of movement and coordination are lost.   The worst pain imaginable 10 At this level, most patients pass out from pain. When this level is reached, collapse of the autonomic nervous system occurs, leading to a sudden drop in blood pressure and heart rate. This in turn results in a temporary and dramatic drop in blood flow to the brain, leading to a loss of consciousness. Fainting is one of the bodys self defense mechanisms. Passing out puts the brain in a calmed state and causes it to shut down for a while, in order to begin the healing process.    Summary: 1. Refer to this scale when providing [place] with your pain level. 2. Be accurate and careful when reporting your pain level. This will help with your care. 3. Over-reporting  your pain level will lead to loss of credibility. 4. Even a level of 1/10 means that there is pain and will be treated at our facility. 5. High, inaccurate reporting will be documented as Symptom Exaggeration, leading to loss of credibility and suspicions of possible secondary gains such as obtaining more narcotics, or wanting to appear disabled, for fraudulent reasons. 6. Only pain levels of 5 or below will be seen at our facility. 7. Pain levels of 6 and above will be sent to the Emergency Department and the appointment cancelled. _____________________________________________________________________________________________  DRUG HOLIDAYS  Definitions Tolerance: defined as the progressively decreased responsiveness to a drug. Occurs when the drug is used repeatedly and the body adapts to the continued presence of the drug. As a result, a larger dose of the drug is needed to achieve the effect originally obtained by a smaller dose. It is thought to be due to the formation of excess opioid receptors.  Drug Holiday: is when a patient stops taking a medication(s) for a period of time; anywhere from a few days to several weeks.  Withdrawals: refers to the wide range of symptoms that occur after stopping or dramatically reducing opiate drugs after heavy and prolonged use. Withdrawal symptoms do not occur to patients that use low dose opioids, or those who take the medication sporadically. Contrary to benzodiazepine (example: Valium, Xanax, etc.) or alcohol withdrawals (Delirium Tremens), opioid withdrawals are not lethal. Withdrawals are the physical  manifestation of the body getting rid of the excess receptors.  Purpose To eliminate tolerance.  Duration of Holiday 14 consecutive days. (2 weeks)  Expected Symptoms Early symptoms of withdrawal include:  Agitation  Anxiety  Muscle aches  Increased tearing  Insomnia  Runny nose  Sweating  Yawning  Late symptoms of withdrawal  include:  Abdominal cramping  Diarrhea  Dilated pupils  Goose bumps  Nausea  Vomiting  Opioid withdrawal reactions are very uncomfortable but are not life-threatening. Symptoms usually start within 12 hours of last opioid dose and within 30 hours of last methadone exposure.  Duration of Symptoms 48 to 72 hours for short acting medications and 2 to 14 days for methadone.  Treatment  Clonidine (Catapres) or tizanidine (Zanaflex) for agitation, sweating, tearing, runny nose.  Promethazine (Phenergan) for nausea, vomiting.  NSAIDs for pain.  Benefits  Improved effectiveness of opioids.  Decreased opioid dose needed to achieve benefits.  Improved pain with lesser dose.

## 2016-08-16 DIAGNOSIS — I4891 Unspecified atrial fibrillation: Secondary | ICD-10-CM | POA: Diagnosis not present

## 2016-08-16 DIAGNOSIS — Z7901 Long term (current) use of anticoagulants: Secondary | ICD-10-CM | POA: Diagnosis not present

## 2016-08-25 DIAGNOSIS — R7301 Impaired fasting glucose: Secondary | ICD-10-CM | POA: Diagnosis not present

## 2016-08-25 DIAGNOSIS — F17200 Nicotine dependence, unspecified, uncomplicated: Secondary | ICD-10-CM | POA: Diagnosis not present

## 2016-08-25 DIAGNOSIS — Z1159 Encounter for screening for other viral diseases: Secondary | ICD-10-CM | POA: Diagnosis not present

## 2016-08-25 DIAGNOSIS — I10 Essential (primary) hypertension: Secondary | ICD-10-CM | POA: Diagnosis not present

## 2016-08-25 DIAGNOSIS — Z7289 Other problems related to lifestyle: Secondary | ICD-10-CM | POA: Diagnosis not present

## 2016-08-25 DIAGNOSIS — E119 Type 2 diabetes mellitus without complications: Secondary | ICD-10-CM | POA: Diagnosis not present

## 2016-09-05 ENCOUNTER — Encounter: Payer: Self-pay | Admitting: Pain Medicine

## 2016-09-05 ENCOUNTER — Ambulatory Visit: Payer: Medicare Other | Attending: Pain Medicine | Admitting: Pain Medicine

## 2016-09-05 VITALS — BP 119/63 | HR 97 | Temp 97.8°F | Resp 16 | Ht 68.0 in | Wt 230.0 lb

## 2016-09-05 DIAGNOSIS — M542 Cervicalgia: Secondary | ICD-10-CM | POA: Diagnosis not present

## 2016-09-05 DIAGNOSIS — F17210 Nicotine dependence, cigarettes, uncomplicated: Secondary | ICD-10-CM | POA: Insufficient documentation

## 2016-09-05 DIAGNOSIS — Z7984 Long term (current) use of oral hypoglycemic drugs: Secondary | ICD-10-CM | POA: Diagnosis not present

## 2016-09-05 DIAGNOSIS — F1190 Opioid use, unspecified, uncomplicated: Secondary | ICD-10-CM | POA: Diagnosis not present

## 2016-09-05 DIAGNOSIS — I10 Essential (primary) hypertension: Secondary | ICD-10-CM | POA: Diagnosis not present

## 2016-09-05 DIAGNOSIS — Z96653 Presence of artificial knee joint, bilateral: Secondary | ICD-10-CM | POA: Diagnosis not present

## 2016-09-05 DIAGNOSIS — Z79899 Other long term (current) drug therapy: Secondary | ICD-10-CM | POA: Diagnosis not present

## 2016-09-05 DIAGNOSIS — G8929 Other chronic pain: Secondary | ICD-10-CM | POA: Diagnosis not present

## 2016-09-05 DIAGNOSIS — M25511 Pain in right shoulder: Secondary | ICD-10-CM

## 2016-09-05 DIAGNOSIS — M19011 Primary osteoarthritis, right shoulder: Secondary | ICD-10-CM

## 2016-09-05 DIAGNOSIS — M19012 Primary osteoarthritis, left shoulder: Secondary | ICD-10-CM | POA: Insufficient documentation

## 2016-09-05 DIAGNOSIS — M25512 Pain in left shoulder: Secondary | ICD-10-CM

## 2016-09-05 DIAGNOSIS — Z96643 Presence of artificial hip joint, bilateral: Secondary | ICD-10-CM | POA: Diagnosis not present

## 2016-09-05 DIAGNOSIS — E119 Type 2 diabetes mellitus without complications: Secondary | ICD-10-CM | POA: Insufficient documentation

## 2016-09-05 DIAGNOSIS — Z9889 Other specified postprocedural states: Secondary | ICD-10-CM | POA: Diagnosis not present

## 2016-09-05 DIAGNOSIS — Z6834 Body mass index (BMI) 34.0-34.9, adult: Secondary | ICD-10-CM | POA: Diagnosis not present

## 2016-09-05 DIAGNOSIS — Z79891 Long term (current) use of opiate analgesic: Secondary | ICD-10-CM | POA: Diagnosis not present

## 2016-09-05 DIAGNOSIS — E7801 Familial hypercholesterolemia: Secondary | ICD-10-CM | POA: Diagnosis not present

## 2016-09-05 DIAGNOSIS — Z880 Allergy status to penicillin: Secondary | ICD-10-CM | POA: Insufficient documentation

## 2016-09-05 DIAGNOSIS — G894 Chronic pain syndrome: Secondary | ICD-10-CM | POA: Diagnosis not present

## 2016-09-05 DIAGNOSIS — Z7901 Long term (current) use of anticoagulants: Secondary | ICD-10-CM | POA: Diagnosis not present

## 2016-09-05 DIAGNOSIS — I4891 Unspecified atrial fibrillation: Secondary | ICD-10-CM | POA: Diagnosis not present

## 2016-09-05 MED ORDER — OXYCODONE HCL 5 MG PO TABS: 5.0000 mg | ORAL_TABLET | Freq: Four times a day (QID) | ORAL | 0 refills | Status: DC

## 2016-09-05 MED ORDER — OXYCODONE HCL 5 MG PO TABS: 5.0000 mg | ORAL_TABLET | Freq: Every day | ORAL | 0 refills | Status: DC

## 2016-09-05 MED ORDER — MORPHINE SULFATE ER 15 MG PO TBCR: 15.0000 mg | EXTENDED_RELEASE_TABLET | Freq: Two times a day (BID) | ORAL | 0 refills | Status: DC

## 2016-09-05 MED ORDER — OXYCODONE HCL 5 MG PO TABS: 5.0000 mg | ORAL_TABLET | Freq: Two times a day (BID) | ORAL | 0 refills | Status: DC

## 2016-09-05 MED ORDER — OXYCODONE HCL 5 MG PO TABS: 5.0000 mg | ORAL_TABLET | Freq: Three times a day (TID) | ORAL | 0 refills | Status: DC

## 2016-09-05 NOTE — Progress Notes (Signed)
Nursing Pain Medication Assessment:  Safety precautions to be maintained throughout the outpatient stay will include: orient to surroundings, keep bed in low position, maintain call bell within reach at all times, provide assistance with transfer out of bed and ambulation.  Medication Inspection Compliance: Pill count conducted under aseptic conditions, in front of the patient. Neither the pills nor the bottle was removed from the patient's sight at any time. Once count was completed pills were immediately returned to the patient in their original bottle.  Medication: Morphine ER (MSContin) Pill/Patch Count: 16 of 60 pills remain Pill/Patch Appearance: Markings consistent with prescribed medication Bottle Appearance: Standard pharmacy container. Clearly labeled. Filled Date: 03/21 / 2018 Last Medication intake:  Yesterday

## 2016-09-05 NOTE — Patient Instructions (Addendum)
____________________________________________________________________________________________  Preparing for your procedure (without sedation) Instructions: . Oral Intake: Do not eat or drink anything for at least 3 hours prior to your procedure. . Transportation: Unless otherwise stated by your physician, you may drive yourself after the procedure. . Blood Pressure Medicine: Take your blood pressure medicine with a sip of water the morning of the procedure. . Blood thinners:  . Diabetics on insulin: Notify the staff so that you can be scheduled 1st case in the morning. If your diabetes requires high dose insulin, take only  of your normal insulin dose the morning of the procedure and notify the staff that you have done so. . Preventing infections: Shower with an antibacterial soap the morning of your procedure.  . Build-up your immune system: Take 1000 mg of Vitamin C with every meal (3 times a day) the day prior to your procedure. . Antibiotics: Inform the staff if you have a condition or reason that requires you to take antibiotics before dental procedures. . Pregnancy: If you are pregnant, call and cancel the procedure. . Sickness: If you have a cold, fever, or any active infections, call and cancel the procedure. . Arrival: You must be in the facility at least 30 minutes prior to your scheduled procedure. . Children: Do not bring any children with you. . Dress appropriately: Bring dark clothing that you would not mind if they get stained. . Valuables: Do not bring any jewelry or valuables. Procedure appointments are reserved for interventional treatments only. . No Prescription Refills. . No medication changes will be discussed during procedure appointments. . No disability issues will be discussed. ____________________________________________________________________________________________  Pain Management Discharge Instructions  General Discharge Instructions :  If you need to  reach your doctor call: Monday-Friday 8:00 am - 4:00 pm at [phone] or toll free [phone].  After clinic hours [phone] to have operator reach doctor.  Bring all of your medication bottles to all your appointments in the pain clinic.  To cancel or reschedule your appointment with Pain Management please remember to call 24 hours in advance to avoid a fee.  Refer to the educational materials which you have been given on: General Risks, I had my Procedure. Discharge Instructions, Post Sedation.  Post Procedure Instructions:  The drugs you were given will stay in your system until tomorrow, so for the next 24 hours you should not drive, make any legal decisions or drink any alcoholic beverages.  You may eat anything you prefer, but it is better to start with liquids then soups and crackers, and gradually work up to solid foods.  Please notify your doctor immediately if you have any unusual bleeding, trouble breathing or pain that is not related to your normal pain.  Depending on the type of procedure that was done, some parts of your body may feel week and/or numb.  This usually clears up by tonight or the next day.  Walk with the use of an assistive device or accompanied by an adult for the 24 hours.  You may use ice on the affected area for the first 24 hours.  Put ice in a Ziploc bag and cover with a towel and place against area 15 minutes on 15 minutes off.  You may switch to heat after 24 hours. 

## 2016-09-05 NOTE — Progress Notes (Addendum)
Patient's Name: Aaron Short  MRN: [id number]  Referring Provider: Neomia Dear, MD  DOB: August 27, 1945  PCP: Neomia Dear, MD  DOS: 09/05/2016  Note by: Kathlen Brunswick. Dossie Arbour, MD  Service setting: Ambulatory outpatient  Specialty: Interventional Pain Management  Location: [facility] (AMB) Pain Management Facility    Patient type: Established   Primary Reason(s) for Visit: Encounter for prescription drug management (Level of risk: moderate) CC: Shoulder Pain (left)  HPI  Aaron Short is a 71 y.o. year old, male patient, who comes today for a medication management evaluation. He has Long term current use of opiate analgesic; Long term prescription opiate use; Opiate use (97.5 MME/Day); Encounter for therapeutic drug level monitoring; Encounter for chronic pain management; Opioid dependence, daily use ([place]); Chronic hip pain (Location of Secondary source of pain) (Bilateral) (R>L); Chronic shoulder pain (Location of Primary Source of Pain) (Bilateral) (L>R); Chronic low back pain (Location of Tertiary source of pain) (Bilateral) (R>L); Chronic knee pain (Bilateral) (R>L); S/P THR: total hip replacement (Bilateral); S/P TKR: total knee replacement (Bilateral); Atrial fibrillation ([place]); Malignant neoplasm of urinary bladder ([place]); Type 2 diabetes mellitus ([place]); Muscle spasticity; Coumadin anticoagulation; Combined fat and carbohydrate induced hyperlipemia; Pure hypercholesterolemia; History of bladder cancer; History of hip fracture; History of pelvic fracture; History of anticoagulant therapy; Chronic neck pain; Chondrocalcinosis; Chronic shoulder pain (Radicular); History of shoulder surgery; Myofascial pain; Musculoskeletal pain; Muscle spasm; Lumbar facet syndrome (Bilateral) (R>L); Lumbar spondylosis; Morbid obesity ([place]); Continuous opioid dependence ([place]); Disturbance of skin sensation; Malignant neoplasm of bladder ([place]); Smoker; FH: atrial fibrillation; Malignant neoplasm of overlapping  sites of bladder ([place]); Obesity, Class I, BMI 30-34.9; Chronic pain syndrome; Hypertension, benign; Impaired fasting glucose; Chronic acromioclavicular joint pain (Left); Osteoarthritis of shoulder (Bilateral) (L>R); 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 His primarily concern today is the Shoulder Pain (left)  Pain Assessment: Self-Reported Pain Score: 1 /10             Reported level is compatible with observation.       Pain Type: Chronic pain Pain Location: Shoulder Pain Orientation: Left Pain Descriptors / Indicators: Larence Penning Pain Frequency: Constant  Aaron Short was last scheduled for an appointment on 08/08/2016 for medication management. During today's appointment we reviewed Aaron Short chronic pain status, as well as his outpatient medication regimen. We have been slowly tapering down this patient's opioids from an initial MME of 105 mg/day to a current 97.5 MME/Day. We have been decreasing his opioid dose by 5 mg of oxycodone per week and at our current rate, by 11/15/2016 he should be down to 30 MME/Day. We will continue to go down until we can accomplish a 2 week "Drug Holiday". After this, we'll get the patient the option of going back on the opioids but to a maximum of 60 MME's. With this slow tapering the patient has not experienced any withdrawal, but his acromioclavicular joint pain has slowly been increasing. We will plan on bringing him back for an acromioclavicular joint injection with local anesthetic and steroids. Hopefully this will bring the pain down to the point where he can do better with this slow tapering. I reviewed his prior shoulder imaging and we have decided to order an MRI of the left shoulder due to his significant decrease in strength range of motion and the shooting pain that he experiences down the left arm when he attempts to abduct the shoulder. Previously he  has had surgeries at [facility]  for hip and knee replacements by Dr. Derald Macleod in [facility]. We will try to coordinate a referral to them after he has completed his MRI so that they can see whether or not he is a good candidate for any further surgeries. Meanwhile we'll continue with the palliative care and a injections to decrease his chronic pain.  The patient  reports that he does not use drugs. His body mass index is 34.97 kg/m.  Further details on both, my assessment(s), as well as the proposed treatment plan, please see below.  Controlled Substance Pharmacotherapy Assessment REMS (Risk Evaluation and Mitigation Strategy)  Analgesic: MS Contin 15 mg twice a day (30 mg/day of morphine) + oxycodone IR 10 mg every 8 hours (30 mg/day of oxycodone) + oxycodone IR 5 mg every 8 hours (15 mg/day of oxycodone) (total of 45 mg/day of oxycodone) MME/day: 97.5 mg/day.  Landis Martins, RN  09/05/2016  9:09 AM  Sign at close encounter Nursing Pain Medication Assessment:  Safety precautions to be maintained throughout the outpatient stay will include: orient to surroundings, keep bed in low position, maintain call bell within reach at all times, provide assistance with transfer out of bed and ambulation.  Medication Inspection Compliance: Pill count conducted under aseptic conditions, in front of the patient. Neither the pills nor the bottle was removed from the patient's sight at any time. Once count was completed pills were immediately returned to the patient in their original bottle.  Medication: Morphine ER (MSContin) Pill/Patch Count: 16 of 60 pills remain Pill/Patch Appearance: Markings consistent with prescribed medication Bottle Appearance: Standard pharmacy container. Clearly labeled. Filled Date: 03/21 / 2018 Last Medication intake:  Arabella Merles, RN  09/05/2016  9:08 AM  Sign at close encounter Nursing Pain Medication Assessment:  Safety precautions to be  maintained throughout the outpatient stay will include: orient to surroundings, keep bed in low position, maintain call bell within reach at all times, provide assistance with transfer out of bed and ambulation.  Medication Inspection Compliance: Pill count conducted under aseptic conditions, in front of the patient. Neither the pills nor the bottle was removed from the patient's sight at any time. Once count was completed pills were immediately returned to the patient in their original bottle.  Medication #1: Oxycodone IR 10 mg Pill/Patch Count: 26 of 90 pills remain Pill/Patch Appearance: Markings consistent with prescribed medication Bottle Appearance: Standard pharmacy container. Clearly labeled. Filled Date:03/21/ 2018 Last Medication intake:  Yesterday  Medication #2: Oxycodone IR 5 mg Pill/Patch Count: 2 of 21 pills remain Pill/Patch Appearance: Markings consistent with prescribed medication Bottle Appearance: Standard pharmacy container. Clearly labeled. Filled Date: 04/04 / 2018 Last Medication intake:  Today   Pharmacokinetics: Liberation and absorption (onset of action): WNL Distribution (time to peak effect): WNL Metabolism and excretion (duration of action): WNL         Pharmacodynamics: Desired effects: Analgesia: Mr. Hoel reports >50% benefit. Functional ability: Patient reports that medication allows him to accomplish basic ADLs Clinically meaningful improvement in function (CMIF): Sustained CMIF goals met Perceived effectiveness: Described as relatively effective, allowing for increase in activities of daily living (ADL) Undesirable effects: Side-effects or Adverse reactions: None reported Monitoring:  PMP: Online review of the past 78-monthperiod conducted. Compliant with practice rules and regulations List of all UDS test(s) done:  Lab Results  Component Value Date   TOXASSSELUR FINAL 02/07/2016   [place]FINAL 11/17/2015   [place]FINAL  08/16/2015   Last UDS on  record: ToxAssure Select 13  Date Value Ref Range Status  02/07/2016 FINAL  Final    Comment:    ==================================================================== TOXASSURE SELECT 13 (MW) ==================================================================== Test                             Result       Flag       Units Drug Present and Declared for Prescription Verification   Morphine                       >7143        EXPECTED   ng/mg creat    Potential sources of large amounts of morphine in the absence of    codeine include administration of morphine or use of heroin.   Hydromorphone                  36           EXPECTED   ng/mg creat    Hydromorphone may be present as a metabolite of morphine;    concentrations of hydromorphone rarely exceed 5% of the morphine    concentration when this is the source of hydromorphone.   Oxycodone                      659          EXPECTED   ng/mg creat   Oxymorphone                    1874         EXPECTED   ng/mg creat   Noroxycodone                   1891         EXPECTED   ng/mg creat   Noroxymorphone                 401          EXPECTED   ng/mg creat    Sources of oxycodone are scheduled prescription medications.    Oxymorphone, noroxycodone, and noroxymorphone are expected    metabolites of oxycodone. Oxymorphone is also available as a    scheduled prescription medication. ==================================================================== Test                      Result    Flag   Units      Ref Range   Creatinine              140              mg/dL      >=20 ==================================================================== Declared Medications:  The flagging and interpretation on this report are based on the  following declared medications.  Unexpected results may arise from  inaccuracies in the declared medications.  **Note: The testing scope of this panel includes these medications:  Morphine (MS  Contin)  Oxycodone (Oxycodone Acetaminophen)  **Note: The testing scope of this panel does not include following  reported medications:  Acetaminophen (Oxycodone Acetaminophen)  Carisoprodol (Soma)  Digoxin (Lanoxin)  Furosemide (Lasix)  Losartan (Cozaar)  Metformin (Glucophage)  Metoprolol (Toprol)  Naloxone  Simvastatin (Zocor)  Warfarin (Coumadin) ==================================================================== For clinical consultation, please call [phone]. ====================================================================    UDS interpretation: Compliant          Medication Assessment Form: Reviewed. Patient indicates being compliant with therapy  Treatment compliance: Compliant Risk Assessment Profile: Aberrant behavior: See prior evaluations. None observed or detected today Comorbid factors increasing risk of overdose: See prior notes. No additional risks detected today Risk of substance use disorder (SUD): Low Opioid Risk Tool (ORT) Total Score:    Interpretation Table:  Score <3 = Low Risk for SUD  Score between 4-7 = Moderate Risk for SUD  Score >8 = High Risk for Opioid Abuse   Risk Mitigation Strategies:  Patient Counseling: Covered Patient-Prescriber Agreement (PPA): Present and active  Notification to other healthcare providers: Done  Pharmacologic Plan: No change in therapy, at this time  Laboratory Chemistry  Inflammation Markers Lab Results  Component Value Date   CRP <0.5 11/18/2015   ESRSEDRATE 6 11/18/2015   (CRP: Acute Phase) (ESR: Chronic Phase) Renal Function Markers Lab Results  Component Value Date   BUN 15 11/18/2015   CREATININE 0.80 11/18/2015   GFRAA >60 11/18/2015   GFRNONAA >60 11/18/2015   Hepatic Function Markers Lab Results  Component Value Date   AST 19 11/18/2015   ALT 17 11/18/2015   ALBUMIN 4.1 11/18/2015   ALKPHOS 68 11/18/2015   Electrolytes Lab Results  Component Value Date   NA 139 11/18/2015   K  4.3 11/18/2015   CL 103 11/18/2015   CALCIUM 9.3 11/18/2015   MG 1.8 11/18/2015   Neuropathy Markers Lab Results  Component Value Date   VITAMINB12 268 11/18/2015   Bone Pathology Markers Lab Results  Component Value Date   ALKPHOS 68 11/18/2015   25OHVITD1 31 11/18/2015   25OHVITD2 <1.0 11/18/2015   25OHVITD3 30 11/18/2015   CALCIUM 9.3 11/18/2015   Coagulation Parameters No results found for: INR, LABPROT, APTT, PLT Cardiovascular Markers No results found for: BNP, HGB, HCT Note: Lab results reviewed.  Recent Diagnostic Imaging Review  Dg C-arm 1-60 Min-no Report  Result Date: 04/27/2016 Fluoroscopy was utilized by the requesting physician.  No radiographic interpretation.   Note: Imaging results reviewed.          Meds  The patient has a current medication list which includes the following prescription(s): carisoprodol, digoxin, furosemide, losartan, melatonin, metformin, metoprolol succinate, morphine, naloxone, oxycodone, oxycodone, oxycodone, oxycodone, oxycodone, oxycodone hcl, rosuvastatin, simvastatin, warfarin, and warfarin.  Current Outpatient Prescriptions on File Prior to Visit  Medication Sig  . carisoprodol (SOMA) 350 MG tablet Take 350 mg by mouth 2 (two) times daily.   . digoxin (DIGOX) 0.125 MG tablet TAKE 1 TABLET BY MOUTH EVERY DAY  . furosemide (LASIX) 20 MG tablet Take 20 mg by mouth as needed.   Marland Kitchen losartan (COZAAR) 50 MG tablet Take 50 mg by mouth daily.   . Melatonin 5 MG TABS Take 5 mg by mouth daily as needed.   . metFORMIN (GLUCOPHAGE) 500 MG tablet Take 500 mg by mouth daily with breakfast.   . metoprolol succinate (TOPROL-XL) 25 MG 24 hr tablet Take 25 mg by mouth.  . Naloxone HCl (NARCAN) 4 MG/0.1ML LIQD Place 1 spray into the nose once. Spray half of bottle content into each nostril, then call 911  . Oxycodone HCl 10 MG TABS Take 1 tablet (10 mg total) by mouth every 8 (eight) hours as needed.  . rosuvastatin (CRESTOR) 20 MG tablet Take 20  mg by mouth daily.   . simvastatin (ZOCOR) 40 MG tablet Take 40 mg by mouth at bedtime.   Marland Kitchen warfarin (COUMADIN) 3 MG tablet Take 2 mg by mouth. every evening, on Tue & Thu  . warfarin (  COUMADIN) 4 MG tablet Take 0.5 tablets (2 mg) by mouth on Tuesday and Thursday. Take 1 tablet (4 mg) by mouth all other days   No current facility-administered medications on file prior to visit.    ROS  Constitutional: Denies any fever or chills Gastrointestinal: No reported hemesis, hematochezia, vomiting, or acute GI distress Musculoskeletal: Denies any acute onset joint swelling, redness, loss of ROM, or weakness Neurological: No reported episodes of acute onset apraxia, aphasia, dysarthria, agnosia, amnesia, paralysis, loss of coordination, or loss of consciousness  Allergies  Mr. Michalski is allergic to diltiazem; penicillins; and tizanidine.  PFSH  Drug: Mr. Hansen  reports that he does not use drugs. Alcohol:  reports that he does not drink alcohol. Tobacco:  reports that he has been smoking Cigarettes.  He has never used smokeless tobacco. Medical:  has a past medical history of Arthralgia of lower leg (04/02/2012); Arthralgia of upper arm (06/18/2009); Cancer ([place]); Diabetes mellitus without complication ([place]); FH: atrial fibrillation; and Hypertension. Family: family history includes Dementia in his mother; Heart disease in his father.  Past Surgical History:  Procedure Laterality Date  . bladder cancer surgery  2015  . JOINT REPLACEMENT    . REPLACEMENT TOTAL HIP W/  RESURFACING IMPLANTS Bilateral   . SHOULDER SURGERY Bilateral   . TOTAL KNEE ARTHROPLASTY Bilateral    Constitutional Exam  General appearance: Well nourished, well developed, and well hydrated. In no apparent acute distress Vitals:   09/05/16 0858  BP: 119/63  Pulse: 97  Resp: 16  Temp: 97.8 F (36.6 C)  TempSrc: Oral  Weight: 230 lb (104.3 kg)  Height: 5' 8" (1.727 m)   BMI Assessment: Estimated body mass  index is 34.97 kg/m as calculated from the following:   Height as of this encounter: 5' 8" (1.727 m).   Weight as of this encounter: 230 lb (104.3 kg).  BMI interpretation table: BMI level Category Range association with higher incidence of chronic pain  <18 kg/m2 Underweight   18.5-24.9 kg/m2 Ideal body weight   25-29.9 kg/m2 Overweight Increased incidence by 20%  30-34.9 kg/m2 Obese (Class I) Increased incidence by 68%  35-39.9 kg/m2 Severe obesity (Class II) Increased incidence by 136%  >40 kg/m2 Extreme obesity (Class III) Increased incidence by 254%   BMI Readings from Last 4 Encounters:  09/05/16 34.97 kg/m  08/08/16 34.36 kg/m  05/11/16 34.21 kg/m  04/24/16 34.21 kg/m   Wt Readings from Last 4 Encounters:  09/05/16 230 lb (104.3 kg)  08/08/16 226 lb (102.5 kg)  05/11/16 225 lb (102.1 kg)  04/24/16 225 lb (102.1 kg)  Psych/Mental status: Alert, oriented x 3 (person, place, & time)       Eyes: PERLA Respiratory: No evidence of acute respiratory distress  Cervical Spine Exam  Inspection: No masses, redness, or swelling Alignment: Symmetrical Functional ROM: Unrestricted ROM Stability: No instability detected Muscle strength & Tone: Functionally intact Sensory: Unimpaired Palpation: No palpable anomalies  Upper Extremity (UE) Exam    Side: Right upper extremity  Side: Left upper extremity  Inspection: No masses, redness, swelling, or asymmetry. No contractures  Inspection: No masses, redness, swelling, or asymmetry. No contractures  Functional ROM: Limited ROM for shoulder  Functional ROM: Minimal ROM for shoulder  Muscle strength & Tone: Functionally intact  Muscle strength & Tone: Functionally intact  Sensory: Movement-associated discomfort  Sensory: Movement-associated pain  Palpation: No palpable anomalies  Palpation: No palpable anomalies  Specialized Test(s): Deferred         Specialized  Test(s): Deferred          Thoracic Spine Exam  Inspection: No  masses, redness, or swelling Alignment: Symmetrical Functional ROM: Unrestricted ROM Stability: No instability detected Sensory: Unimpaired Muscle strength & Tone: No palpable anomalies  Lumbar Spine Exam  Inspection: No masses, redness, or swelling Alignment: Symmetrical Functional ROM: Unrestricted ROM Stability: No instability detected Muscle strength & Tone: Functionally intact Sensory: Unimpaired Palpation: No palpable anomalies Provocative Tests: Lumbar Hyperextension and rotation test: evaluation deferred today       Patrick's Maneuver: evaluation deferred today              Gait & Posture Assessment  Ambulation: Unassisted Gait: Relatively normal for age and body habitus Posture: WNL   Lower Extremity Exam    Side: Right lower extremity  Side: Left lower extremity  Inspection: No masses, redness, swelling, or asymmetry. No contractures  Inspection: No masses, redness, swelling, or asymmetry. No contractures  Functional ROM: Unrestricted ROM          Functional ROM: Unrestricted ROM          Muscle strength & Tone: Functionally intact  Muscle strength & Tone: Functionally intact  Sensory: Unimpaired  Sensory: Unimpaired  Palpation: No palpable anomalies  Palpation: No palpable anomalies   Assessment  Primary Diagnosis & Pertinent Problem List: The primary encounter diagnosis was Chronic shoulder pain (Location of Primary Source of Pain) (Bilateral) (L>R). 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  Status Diagnosis  Worsening Worsening Persistent 1. Chronic shoulder pain (Location of Primary Source of Pain) (Bilateral) (L>R)   2. Arthralgia of acromioclavicular joint (Bilateral)  (L>R)   3. 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 Osteoarthritis of shoulder (Bilateral) (L>R)   5. Chronic acromioclavicular joint pain (Left)   6. Chronic pain syndrome   7. Long term current use of anticoagulant   8. Opiate use (105 MME/Day)      Plan of Care  Pharmacotherapy (Medications Ordered): Meds ordered this encounter  Medications  . morphine (MS CONTIN) 15 MG 12 hr tablet    Sig: Take 1 tablet (15 mg total) by mouth every 12 (twelve) hours.    Dispense:  84 tablet    Refill:  0    Fill one day early if pharmacy is closed on scheduled refill date. Do not fill until: 10/04/16 To last until: 11/15/16  . oxyCODONE (OXY IR/ROXICODONE) 5 MG immediate release tablet    Sig: Take 1 tablet (5 mg total) by mouth 5 (five) times daily. Max: 5/day    Dispense:  35 tablet    Refill:  0    This is an opioid taper. Provide patient with medication in the exact order and day as requested. Patient may have prescription filled one day early if pharmacy is closed on scheduled refill date. Do not fill until: 10/04/16 To last until: 10/11/16  . oxyCODONE (OXY IR/ROXICODONE) 5 MG immediate release tablet    Sig: Take 1 tablet (5 mg total) by mouth 4 (four) times daily. Max: 4/day    Dispense:  28 tablet    Refill:  0    This is an opioid taper. Provide patient with medication in the exact order and day as requested. Patient may have prescription filled one day early if pharmacy is closed  on scheduled refill date. Do not fill until: 10/11/16 To last until: 10/18/16  . oxyCODONE (OXY IR/ROXICODONE) 5 MG immediate release tablet    Sig: Take 1 tablet (5 mg total) by mouth 3 (three) times daily. Max: 3/day    Dispense:  21 tablet    Refill:  0    This is an opioid taper. Provide patient with medication in the exact order and day as requested. Patient may have prescription filled one day early if pharmacy is closed on scheduled refill date. Do not fill until: 10/18/16 To last until:  10/25/16  . oxyCODONE (OXY IR/ROXICODONE) 5 MG immediate release tablet    Sig: Take 1 tablet (5 mg total) by mouth 2 (two) times daily. Max: 2/day    Dispense:  14 tablet    Refill:  0    This is an opioid taper. Provide patient with medication in the exact order and day as requested. Patient may have prescription filled one day early if pharmacy is closed on scheduled refill date. Do not fill until: 10/25/16 To last until: 11/01/16  . oxyCODONE (OXY IR/ROXICODONE) 5 MG immediate release tablet    Sig: Take 1 tablet (5 mg total) by mouth daily. Max: 1/day    Dispense:  7 tablet    Refill:  0    This is an opioid taper. Provide patient with medication in the exact order and day as requested. Patient may have prescription filled one day early if pharmacy is closed on scheduled refill date. Do not fill until: 11/01/16 To last until: 11/08/16   New Prescriptions   No medications on file   Medications administered today: Mr. Vantrease had no medications administered during this visit. Lab-work, procedure(s), and/or referral(s): Orders Placed This Encounter  Procedures  . SHOULDER INJECTION  . MR SHOULDER LEFT WO CONTRAST  . Ambulatory referral to Orthopedic Surgery  . Warfarin Therapy Instructions to Nursing   Imaging and/or referral(s): WARFARIN THERAPY INSTRUCTIONS TO NURSING AMB REFERRAL TO ORTHOPEDIC SURGERY MR SHOULDER LEFT WO CONTRAST  Interventional therapies: Planned, scheduled, and/or pending:   Diagnostic left acromioclavicular joint injection under fluoroscopic guidance with 25-gauge needle.    Considering:   (Stop Coumadin for 5 days prior to procedure) Left sided suprascapular nerve radiofrequency ablation    Palliative PRN treatment(s):   (Stop Coumadin for 5 days prior to procedure) Suprascapular nerve block  Possible suprascapular nerve radiofrequency ablation    Provider-requested follow-up: Return in 2 months (on 11/09/2016) for (MD) Med-Mgmt, in addition,  procedure (ASAP), (stop blood thinner before procedure).  Future Appointments Date Time Provider Door  11/07/2016 9:30 AM [place], NP [facility] None   Primary Care Physician: Neomia Dear, MD Location: [facility] Outpatient Pain Management Facility Note by: Kathlen Brunswick. Dossie Arbour, M.D, DABA, DABAPM, DABPM, DABIPP, FIPP Date: 09/05/2016; Time: 7:45 PM  Pain Score Disclaimer: We use the NRS-11 scale. This is a self-reported, subjective measurement of pain severity with only modest accuracy. It is used primarily to identify changes within a particular patient. It must be understood that outpatient pain scales are significantly less accurate that those used for research, where they can be applied under ideal controlled circumstances with minimal exposure to variables. In reality, the score is likely to be a combination of pain intensity and pain affect, where pain affect describes the degree of emotional arousal or changes in action readiness caused by the sensory experience of pain. Factors such as social and work situation, setting, emotional state, anxiety levels, expectation, and  prior pain experience may influence pain perception and show large inter-individual differences that may also be affected by time variables.  Patient instructions provided during this appointment: Patient Instructions  Preparing for your procedure (without sedation) Instructions: . Oral Intake: Do not eat or drink anything for at least 3 hours prior to your procedure. . Transportation: Unless otherwise stated by your physician, you may drive yourself after the procedure. . Blood Pressure Medicine: Take your blood pressure medicine with a sip of water the morning of the procedure. . Blood thinners:  . Diabetics on insulin: Notify the staff so that you can be scheduled 1st case in the morning. If your diabetes requires high dose insulin, take only  of your normal insulin dose the morning of the procedure  and notify the staff that you have done so. . Preventing infections: Shower with an antibacterial soap the morning of your procedure.  . Build-up your immune system: Take 1000 mg of Vitamin C with every meal (3 times a day) the day prior to your procedure. [facility] Antibiotics: Inform the staff if you have a condition or reason that requires you to take antibiotics before dental procedures. . Pregnancy: If you are pregnant, call and cancel the procedure. . Sickness: If you have a cold, fever, or any active infections, call and cancel the procedure. . Arrival: You must be in the facility at least 30 minutes prior to your scheduled procedure. . Children: Do not bring any children with you. . Dress appropriately: Bring dark clothing that you would not mind if they get stained. . Valuables: Do not bring any jewelry or valuables. Procedure appointments are reserved for interventional treatments only. [facility] No Prescription Refills. . No medication changes will be discussed during procedure appointments. . No disability issues will be discussed.  ____________________________________________________________________________________________  Pain Management Discharge Instructions  General Discharge Instructions :  If you need to reach your doctor call: Monday-Friday 8:00 am - 4:00 pm at [phone] or toll free [phone].  After clinic hours [phone] to have operator reach doctor.  Bring all of your medication bottles to all your appointments in the pain clinic.  To cancel or reschedule your appointment with Pain Management please remember to call 24 hours in advance to avoid a fee.  Refer to the educational materials which you have been given on: General Risks, I had my Procedure. Discharge Instructions, Post Sedation.  Post Procedure Instructions:  The drugs you were given will stay in your system until tomorrow, so for the next 24 hours you should not drive, make any legal decisions or drink  any alcoholic beverages.  You may eat anything you prefer, but it is better to start with liquids then soups and crackers, and gradually work up to solid foods.  Please notify your doctor immediately if you have any unusual bleeding, trouble breathing or pain that is not related to your normal pain.  Depending on the type of procedure that was done, some parts of your body may feel week and/or numb.  This usually clears up by tonight or the next day.  Walk with the use of an assistive device or accompanied by an adult for the 24 hours.  You may use ice on the affected area for the first 24 hours.  Put ice in a Ziploc bag and cover with a towel and place against area 15 minutes on 15 minutes off.  You may switch to heat after 24 hours.

## 2016-09-05 NOTE — Progress Notes (Signed)
Nursing Pain Medication Assessment:  Safety precautions to be maintained throughout the outpatient stay will include: orient to surroundings, keep bed in low position, maintain call bell within reach at all times, provide assistance with transfer out of bed and ambulation.  Medication Inspection Compliance: Pill count conducted under aseptic conditions, in front of the patient. Neither the pills nor the bottle was removed from the patient's sight at any time. Once count was completed pills were immediately returned to the patient in their original bottle.  Medication #1: Oxycodone IR 10 mg Pill/Patch Count: 26 of 90 pills remain Pill/Patch Appearance: Markings consistent with prescribed medication Bottle Appearance: Standard pharmacy container. Clearly labeled. Filled Date:03/21/ 2018 Last Medication intake:  Yesterday  Medication #2: Oxycodone IR 5 mg Pill/Patch Count: 2 of 21 pills remain Pill/Patch Appearance: Markings consistent with prescribed medication Bottle Appearance: Standard pharmacy container. Clearly labeled. Filled Date: 04/04 / 2018 Last Medication intake:  Today

## 2016-09-12 DIAGNOSIS — M25512 Pain in left shoulder: Secondary | ICD-10-CM | POA: Diagnosis not present

## 2016-09-13 DIAGNOSIS — I4891 Unspecified atrial fibrillation: Secondary | ICD-10-CM | POA: Diagnosis not present

## 2016-09-13 DIAGNOSIS — Z7901 Long term (current) use of anticoagulants: Secondary | ICD-10-CM | POA: Diagnosis not present

## 2016-09-14 ENCOUNTER — Telehealth: Payer: Self-pay | Admitting: *Deleted

## 2016-09-14 ENCOUNTER — Telehealth: Payer: Self-pay | Admitting: Pain Medicine

## 2016-09-14 NOTE — Telephone Encounter (Signed)
Spoke with patient and he would like to proceed with scheduling MRI that was ordered on April 10 and he would like to have this done at the [facility] location.  He states that the shoulder pain has worsened and that he would also like additional pain medication to take to last him until May 9 as he is out of his MS Contin 15 mg.  Explained to him that we can not manage medications over the phone that would require an appt and based on his medication taper Dr Dossie Arbour may not prescribe additional pain medication for him.  I did tell him that we could schedule him an appt if he would like to discuss this with Dr Dossie Arbour.  Patient declines and would just like to have MRI appt scheduled.

## 2016-09-14 NOTE — Telephone Encounter (Signed)
Having increased shoulder pain, would like to talk with someone about options, please call

## 2016-10-04 DIAGNOSIS — I4891 Unspecified atrial fibrillation: Secondary | ICD-10-CM | POA: Diagnosis not present

## 2016-10-04 DIAGNOSIS — Z7901 Long term (current) use of anticoagulants: Secondary | ICD-10-CM | POA: Diagnosis not present

## 2016-10-05 DIAGNOSIS — M62838 Other muscle spasm: Secondary | ICD-10-CM | POA: Diagnosis not present

## 2016-10-05 DIAGNOSIS — Z5181 Encounter for therapeutic drug level monitoring: Secondary | ICD-10-CM | POA: Diagnosis not present

## 2016-10-05 DIAGNOSIS — F17200 Nicotine dependence, unspecified, uncomplicated: Secondary | ICD-10-CM | POA: Diagnosis not present

## 2016-10-05 DIAGNOSIS — E119 Type 2 diabetes mellitus without complications: Secondary | ICD-10-CM | POA: Diagnosis not present

## 2016-10-05 DIAGNOSIS — Z79899 Other long term (current) drug therapy: Secondary | ICD-10-CM | POA: Diagnosis not present

## 2016-10-05 DIAGNOSIS — M6240 Contracture of muscle, unspecified site: Secondary | ICD-10-CM | POA: Diagnosis not present

## 2016-10-10 ENCOUNTER — Ambulatory Visit
Admission: RE | Admit: 2016-10-10 | Discharge: 2016-10-10 | Disposition: A | Discharge status: Home / Self Care | Payer: Medicare Other | Source: Ambulatory Visit | Attending: Pain Medicine | Admitting: Pain Medicine

## 2016-10-10 DIAGNOSIS — M25511 Pain in right shoulder: Secondary | ICD-10-CM | POA: Insufficient documentation

## 2016-10-10 DIAGNOSIS — M25512 Pain in left shoulder: Secondary | ICD-10-CM | POA: Insufficient documentation

## 2016-10-10 DIAGNOSIS — S46112A Strain of muscle, fascia and tendon of long head of biceps, left arm, initial encounter: Secondary | ICD-10-CM | POA: Diagnosis not present

## 2016-10-10 DIAGNOSIS — G8929 Other chronic pain: Secondary | ICD-10-CM | POA: Diagnosis not present

## 2016-10-10 DIAGNOSIS — X58XXXA Exposure to other specified factors, initial encounter: Secondary | ICD-10-CM | POA: Diagnosis not present

## 2016-10-10 DIAGNOSIS — M7552 Bursitis of left shoulder: Secondary | ICD-10-CM | POA: Insufficient documentation

## 2016-10-10 DIAGNOSIS — M75102 Unspecified rotator cuff tear or rupture of left shoulder, not specified as traumatic: Secondary | ICD-10-CM | POA: Insufficient documentation

## 2016-10-10 DIAGNOSIS — M19012 Primary osteoarthritis, left shoulder: Secondary | ICD-10-CM | POA: Diagnosis not present

## 2016-10-10 NOTE — Progress Notes (Signed)
Results were reviewed and found to be: abnormal  Surgical consultation may be of benefit  Review would suggest interventional pain management techniques may be of benefit

## 2016-10-25 DIAGNOSIS — Z7901 Long term (current) use of anticoagulants: Secondary | ICD-10-CM | POA: Diagnosis not present

## 2016-10-25 DIAGNOSIS — I4891 Unspecified atrial fibrillation: Secondary | ICD-10-CM | POA: Diagnosis not present

## 2016-11-07 ENCOUNTER — Ambulatory Visit: Payer: Medicare Other | Attending: Nurse Practitioner | Admitting: Nurse Practitioner

## 2016-11-07 ENCOUNTER — Encounter: Payer: Self-pay | Admitting: Nurse Practitioner

## 2016-11-07 VITALS — BP 121/51 | HR 61 | Temp 97.6°F | Resp 16 | Ht 68.0 in | Wt 230.0 lb

## 2016-11-07 DIAGNOSIS — G8929 Other chronic pain: Secondary | ICD-10-CM | POA: Diagnosis not present

## 2016-11-07 DIAGNOSIS — Z96653 Presence of artificial knee joint, bilateral: Secondary | ICD-10-CM | POA: Diagnosis not present

## 2016-11-07 DIAGNOSIS — F17210 Nicotine dependence, cigarettes, uncomplicated: Secondary | ICD-10-CM | POA: Diagnosis not present

## 2016-11-07 DIAGNOSIS — E119 Type 2 diabetes mellitus without complications: Secondary | ICD-10-CM | POA: Diagnosis not present

## 2016-11-07 DIAGNOSIS — M25511 Pain in right shoulder: Secondary | ICD-10-CM

## 2016-11-07 DIAGNOSIS — G894 Chronic pain syndrome: Secondary | ICD-10-CM | POA: Insufficient documentation

## 2016-11-07 DIAGNOSIS — M5412 Radiculopathy, cervical region: Secondary | ICD-10-CM | POA: Diagnosis not present

## 2016-11-07 DIAGNOSIS — M79602 Pain in left arm: Secondary | ICD-10-CM | POA: Diagnosis not present

## 2016-11-07 DIAGNOSIS — Z7901 Long term (current) use of anticoagulants: Secondary | ICD-10-CM | POA: Diagnosis not present

## 2016-11-07 DIAGNOSIS — Z9889 Other specified postprocedural states: Secondary | ICD-10-CM | POA: Insufficient documentation

## 2016-11-07 DIAGNOSIS — I10 Essential (primary) hypertension: Secondary | ICD-10-CM | POA: Diagnosis not present

## 2016-11-07 DIAGNOSIS — I4891 Unspecified atrial fibrillation: Secondary | ICD-10-CM | POA: Insufficient documentation

## 2016-11-07 DIAGNOSIS — Z7984 Long term (current) use of oral hypoglycemic drugs: Secondary | ICD-10-CM | POA: Diagnosis not present

## 2016-11-07 DIAGNOSIS — Z5181 Encounter for therapeutic drug level monitoring: Secondary | ICD-10-CM | POA: Insufficient documentation

## 2016-11-07 DIAGNOSIS — E7801 Familial hypercholesterolemia: Secondary | ICD-10-CM | POA: Insufficient documentation

## 2016-11-07 DIAGNOSIS — M542 Cervicalgia: Secondary | ICD-10-CM | POA: Diagnosis not present

## 2016-11-07 DIAGNOSIS — M25512 Pain in left shoulder: Secondary | ICD-10-CM | POA: Diagnosis not present

## 2016-11-07 DIAGNOSIS — Z79891 Long term (current) use of opiate analgesic: Secondary | ICD-10-CM | POA: Diagnosis not present

## 2016-11-07 DIAGNOSIS — Z880 Allergy status to penicillin: Secondary | ICD-10-CM | POA: Diagnosis not present

## 2016-11-07 DIAGNOSIS — Z96643 Presence of artificial hip joint, bilateral: Secondary | ICD-10-CM | POA: Insufficient documentation

## 2016-11-07 MED ORDER — OXYCODONE HCL 5 MG PO TABS: 5.0000 mg | ORAL_TABLET | Freq: Every day | ORAL | 0 refills | Status: DC

## 2016-11-07 MED ORDER — OXYCODONE HCL 5 MG PO TABS
5.0000 mg | ORAL_TABLET | Freq: Two times a day (BID) | ORAL | 0 refills | Status: DC
Start: 2016-12-06 — End: 2016-12-21

## 2016-11-07 MED ORDER — OXYCODONE HCL 5 MG PO TABS
5.0000 mg | ORAL_TABLET | Freq: Four times a day (QID) | ORAL | 0 refills | Status: DC
Start: 2016-11-22 — End: 2016-12-21

## 2016-11-07 MED ORDER — OXYCODONE HCL 5 MG PO TABS: 5.0000 mg | ORAL_TABLET | Freq: Three times a day (TID) | ORAL | 0 refills | Status: DC

## 2016-11-07 NOTE — Patient Instructions (Signed)

## 2016-11-07 NOTE — Progress Notes (Signed)
Nursing Pain Medication Assessment:  Safety precautions to be maintained throughout the outpatient stay will include: orient to surroundings, keep bed in low position, maintain call bell within reach at all times, provide assistance with transfer out of bed and ambulation.  Medication Inspection Compliance: Pill count conducted under aseptic conditions, in front of the patient. Neither the pills nor the bottle was removed from the patient's sight at any time. Once count was completed pills were immediately returned to the patient in their original bottle.  Medication #1: Morphine ER (MSContin) Pill/Patch Count: 0 of 84 pills remain Pill/Patch Appearance: none to count Bottle Appearance: Standard pharmacy container. Clearly labeled. Filled Date: 05 / 09 / 2018 Last Medication intake:  Today  Medication #2: Oxycodone IR Pill/Patch Count: 0 of 7 pills remain Pill/Patch Appearance: none to count Bottle Appearance: Standard pharmacy container. Clearly labeled. Filled Date: 06 / 06 / 2018 Last Medication intake:  Today

## 2016-11-07 NOTE — Progress Notes (Signed)
Patient's Name: Aaron Short  MRN: [id number]  Referring Provider: Neomia Dear, MD  DOB: November 29, 1945  PCP: Neomia Dear, MD  DOS: 11/07/2016  Note by: Vevelyn Francois NP  Service setting: Ambulatory outpatient  Specialty: Interventional Pain Management  Location: [facility] (AMB) Pain Management Facility    Patient type: Established    Primary Reason(s) for Visit: Encounter for prescription drug management (Level of risk: moderate) CC: Shoulder Pain (left); Arm Pain (left); and Neck Pain  HPI  Aaron Short is a 71 y.o. year old, male patient, who comes today for a medication management evaluation. He has Long term current use of opiate analgesic; Long term prescription opiate use; Opiate use (97.5 MME/Day); Encounter for therapeutic drug level monitoring; Encounter for chronic pain management; Opioid dependence, daily use ([place]); Chronic hip pain (Location of Secondary source of pain) (Bilateral) (R>L); Chronic shoulder pain (Location of Primary Source of Pain) (Bilateral) (L>R); Chronic low back pain (Location of Tertiary source of pain) (Bilateral) (R>L); Chronic knee pain (Bilateral) (R>L); S/P THR: total hip replacement (Bilateral); S/P TKR: total knee replacement (Bilateral); Atrial fibrillation ([place]); Malignant neoplasm of urinary bladder ([place]); Type 2 diabetes mellitus ([place]); Muscle spasticity; Coumadin anticoagulation; Combined fat and carbohydrate induced hyperlipemia; Pure hypercholesterolemia; History of bladder cancer; History of hip fracture; History of pelvic fracture; History of anticoagulant therapy; Chronic neck pain; Chondrocalcinosis; Chronic shoulder pain (Radicular); History of shoulder surgery; Myofascial pain; Musculoskeletal pain; Muscle spasm; Lumbar facet syndrome (Bilateral) (R>L); Lumbar spondylosis; Morbid obesity ([place]); Continuous opioid dependence ([place]); Disturbance of skin sensation; Malignant neoplasm of bladder ([place]); Smoker; FH: atrial fibrillation; Malignant  neoplasm of overlapping sites of bladder ([place]); Obesity, Class I, BMI 30-34.9; Chronic pain syndrome; Hypertension, benign; Impaired fasting glucose; Chronic acromioclavicular joint pain (Left); Osteoarthritis of shoulder (Bilateral) (L>R); Acromioclavicular arthrosis (Bilateral) (L>R); Arthralgia of acromioclavicular joint (Bilateral) (L>R); and Essential hypertension on his problem list. His primarily concern today is the Shoulder Pain (left); Arm Pain (left); and Neck Pain  Pain Assessment: Self-Reported Pain Score: 2 /10             Reported level is compatible with observation.       Pain Location: Shoulder Pain Orientation: Left Pain Descriptors / Indicators: Dull, Shooting, Sharp, Throbbing Pain Frequency: Intermittent  Aaron Short was last scheduled for an appointment on Visit date not found for medication management. During today's appointment we reviewed [name] chronic pain status, as well as his outpatient medication regimen. He has chronic neck and shoulder pain. He admits that he has generalized pain that comes and goes. He is currently on a opioid taper. He is taking MS Contin twice daily along with oxycodone 5 mg 3 times daily.He admits he has taken more than prescribed of his medication. He states that some days the pain is worse than others.  The patient  reports that he does not use drugs. His body mass index is 34.97 kg/m.  Further details on both, my assessment(s), as well as the proposed treatment plan, please see below.  Controlled Substance Pharmacotherapy Assessment REMS (Risk Evaluation and Mitigation Strategy)  Analgesic: MS Contin 15 mg twice a day (30 mg/day of morphine)  oxycodone IR 5 mg every 8 hours (15 mg/day of oxycodone)  Hart Rochester, RN  11/07/2016 11:00 AM  Sign at close encounter Nursing Pain Medication Assessment:  Safety precautions to be maintained throughout the outpatient stay will include: orient to surroundings, keep bed in  low position, maintain call bell within reach at all times, provide  assistance with transfer out of bed and ambulation.  Medication Inspection Compliance: Pill count conducted under aseptic conditions, in front of the patient. Neither the pills nor the bottle was removed from the patient's sight at any time. Once count was completed pills were immediately returned to the patient in their original bottle.  Medication #1: Morphine ER (MSContin) Pill/Patch Count: 0 of 84 pills remain Pill/Patch Appearance: none to count Bottle Appearance: Standard pharmacy container. Clearly labeled. Filled Date: 05 / 09 / 2018 Last Medication intake:  Today  Medication #2: Oxycodone IR Pill/Patch Count: 0 of 7 pills remain Pill/Patch Appearance: none to count Bottle Appearance: Standard pharmacy container. Clearly labeled. Filled Date: 06 / 06 / 2018 Last Medication intake:  Today   Pharmacokinetics: Liberation and absorption (onset of action): WNL Distribution (time to peak effect): WNL Metabolism and excretion (duration of action): WNL         Pharmacodynamics: Desired effects: Analgesia: Aaron Short reports >50% benefit. Functional ability: Patient reports that medication allows him to accomplish basic ADLs Clinically meaningful improvement in function (CMIF): Sustained CMIF goals met Perceived effectiveness: Described as relatively effective, allowing for increase in activities of daily living (ADL) Undesirable effects: Side-effects or Adverse reactions: None reported Monitoring: [place] PMP: Online review of the past 12-monthperiod conducted. Compliant with practice rules and regulations List of all UDS test(s) done:  Lab Results  Component Value Date   TOXASSSELUR FINAL 02/07/2016   TOXASSSELUR FINAL 11/17/2015   TDundeeFINAL 08/16/2015   Last UDS on record: ToxAssure Select 13  Date Value Ref Range Status  02/07/2016 FINAL  Final    Comment:     ==================================================================== TOXASSURE SELECT 13 (MW) ==================================================================== Test                             Result       Flag       Units Drug Present and Declared for Prescription Verification   Morphine                       >7143        EXPECTED   ng/mg creat    Potential sources of large amounts of morphine in the absence of    codeine include administration of morphine or use of heroin.   Hydromorphone                  36           EXPECTED   ng/mg creat    Hydromorphone may be present as a metabolite of morphine;    concentrations of hydromorphone rarely exceed 5% of the morphine    concentration when this is the source of hydromorphone.   Oxycodone                      659          EXPECTED   ng/mg creat   Oxymorphone                    1874         EXPECTED   ng/mg creat   Noroxycodone                   1891         EXPECTED   ng/mg creat   Noroxymorphone  401          EXPECTED   ng/mg creat    Sources of oxycodone are scheduled prescription medications.    Oxymorphone, noroxycodone, and noroxymorphone are expected    metabolites of oxycodone. Oxymorphone is also available as a    scheduled prescription medication. ==================================================================== Test                      Result    Flag   Units      Ref Range   Creatinine              140              mg/dL      >=20 ==================================================================== Declared Medications:  The flagging and interpretation on this report are based on the  following declared medications.  Unexpected results may arise from  inaccuracies in the declared medications.  **Note: The testing scope of this panel includes these medications:  Morphine (MS Contin)  Oxycodone (Oxycodone Acetaminophen)  **Note: The testing scope of this panel does not include following  reported  medications:  Acetaminophen (Oxycodone Acetaminophen)  Carisoprodol (Soma)  Digoxin (Lanoxin)  Furosemide (Lasix)  Losartan (Cozaar)  Metformin (Glucophage)  Metoprolol (Toprol)  Naloxone  Simvastatin (Zocor)  Warfarin (Coumadin) ==================================================================== For clinical consultation, please call [phone]. ====================================================================    UDS interpretation: Compliant          Medication Assessment Form: Reviewed. Patient indicates being compliant with therapy Treatment compliance: Compliant Risk Assessment Profile: Aberrant behavior: See prior evaluations. None observed or detected today Comorbid factors increasing risk of overdose: See prior notes. No additional risks detected today Risk of substance use disorder (SUD): Low Opioid Risk Tool (ORT) Total Score: 0  Interpretation Table:  Score <3 = Low Risk for SUD  Score between 4-7 = Moderate Risk for SUD  Score >8 = High Risk for Opioid Abuse   Risk Mitigation Strategies:  Patient Counseling: Covered Patient-Prescriber Agreement (PPA): Present and active  Notification to other healthcare providers: Done  Pharmacologic Plan: No change in therapy, at this time  Laboratory Chemistry  Inflammation Markers Lab Results  Component Value Date   CRP <0.5 11/18/2015   ESRSEDRATE 6 11/18/2015   (CRP: Acute Phase) (ESR: Chronic Phase) Renal Function Markers Lab Results  Component Value Date   BUN 15 11/18/2015   CREATININE 0.80 11/18/2015   GFRAA >60 11/18/2015   GFRNONAA >60 11/18/2015   Hepatic Function Markers Lab Results  Component Value Date   AST 19 11/18/2015   ALT 17 11/18/2015   ALBUMIN 4.1 11/18/2015   ALKPHOS 68 11/18/2015   Electrolytes Lab Results  Component Value Date   NA 139 11/18/2015   K 4.3 11/18/2015   CL 103 11/18/2015   CALCIUM 9.3 11/18/2015   MG 1.8 11/18/2015   Neuropathy Markers Lab Results   Component Value Date   VITAMINB12 268 11/18/2015   Bone Pathology Markers Lab Results  Component Value Date   ALKPHOS 68 11/18/2015   25OHVITD1 31 11/18/2015   25OHVITD2 <1.0 11/18/2015   25OHVITD3 30 11/18/2015   CALCIUM 9.3 11/18/2015   Coagulation Parameters No results found for: INR, LABPROT, APTT, PLT Cardiovascular Markers No results found for: BNP, HGB, HCT Note: Lab results reviewed.  Recent Diagnostic Imaging Review  Mr Shoulder Left Wo Contrast  Result Date: 10/10/2016 CLINICAL DATA:  Shoulder pain and limited range of motion. Remote history of shoulder surgery. EXAM: MRI OF THE LEFT SHOULDER WITHOUT  CONTRAST TECHNIQUE: Multiplanar, multisequence MR imaging of the shoulder was performed. No intravenous contrast was administered. COMPARISON:  Radiographs 11/18/2015 FINDINGS: Rotator cuff: Significant supraspinatus and infraspinatus tendinopathy/tendinosis with articular surface and interstitial tears extending back toward the musculotendinous junction region. No full-thickness retracted tear but probably at risk for such. The subscapularis tendon is intact. Muscles: Mild fatty atrophy of the shoulder musculature, in particular the supraspinatus and infraspinatus muscles. There are intramuscular ganglion cysts in the infraspinatus muscle likely associated with articular surface tears. Biceps long head: Not visualized on any of the imaging sequences and likely chronically torn and retracted. Acromioclavicular Joint: Moderate degenerative changes. Type 1-2 acromion. Mild lateral downsloping and undersurface spurring. Glenohumeral Joint: Moderate degenerative changes with joint space narrowing an osteophytic spurring. Moderate degenerative chondrosis and small joint effusion. There is thickening of the capsular structures in the axillary recess which can be seen with adhesive capsulitis or synovitis. Labrum: The superior labrum is degenerated but no obvious tear. The anterior posterior  labrum are grossly intact. Bones: No acute bony findings. Subchondral cystic change noted in the humeral head. Other: Moderate subacromial/ subdeltoid bursitis. IMPRESSION: 1. Significant rotator cuff tendinopathy/tendinosis with interstitial and articular surface tears involving the supraspinatus and infraspinatus tendons. No full thickness retracted tear. 2. Likely chronically torn and retracted long head biceps tendon. 3. Moderate AC joint degenerative changes with mild lateral downsloping and undersurface spurring may contribute to bony impingement. 4. Moderate glenohumeral joint degenerative changes with synovitis or adhesive capsulitis. 5. Moderate subacromial/subdeltoid bursitis Electronically Signed   By: Marijo Sanes M.D.   On: 10/10/2016 14:16   Note: Imaging results reviewed.          Meds  The patient has a current medication list which includes the following prescription(s): carisoprodol, digoxin, furosemide, losartan, melatonin, metformin, metoprolol succinate, morphine, naloxone, oxycodone, rosuvastatin, warfarin, warfarin, oxycodone, oxycodone, oxycodone, oxycodone, and simvastatin.  Current Outpatient Prescriptions on File Prior to Visit  Medication Sig  . carisoprodol (SOMA) 350 MG tablet Take 350 mg by mouth 2 (two) times daily.   . digoxin (DIGOX) 0.125 MG tablet TAKE 1 TABLET BY MOUTH EVERY DAY  . furosemide (LASIX) 20 MG tablet Take 20 mg by mouth as needed.   Marland Kitchen losartan (COZAAR) 50 MG tablet Take 50 mg by mouth daily.   . Melatonin 5 MG TABS Take 5 mg by mouth daily as needed.   . metFORMIN (GLUCOPHAGE) 500 MG tablet Take 500 mg by mouth daily with breakfast.   . metoprolol succinate (TOPROL-XL) 25 MG 24 hr tablet Take 25 mg by mouth.  . morphine (MS CONTIN) 15 MG 12 hr tablet Take 1 tablet (15 mg total) by mouth every 12 (twelve) hours.  . Naloxone HCl (NARCAN) 4 MG/0.1ML LIQD Place 1 spray into the nose once. Spray half of bottle content into each nostril, then call 911  .  rosuvastatin (CRESTOR) 20 MG tablet Take 20 mg by mouth daily.   . simvastatin (ZOCOR) 40 MG tablet Take 40 mg by mouth at bedtime.    No current facility-administered medications on file prior to visit.    ROS  Constitutional: Denies any fever or chills Gastrointestinal: No reported hemesis, hematochezia, vomiting, or acute GI distress Musculoskeletal: Denies any acute onset joint swelling, redness, loss of ROM, or weakness Neurological: No reported episodes of acute onset apraxia, aphasia, dysarthria, agnosia, amnesia, paralysis, loss of coordination, or loss of consciousness  Allergies  Mr. Mahler is allergic to diltiazem; penicillins; and tizanidine.  [place]  Drug: Mr. Allerton  reports that he does not use drugs. Alcohol:  reports that he does not drink alcohol. Tobacco:  reports that he has been smoking Cigarettes.  He has never used smokeless tobacco. Medical:  has a past medical history of Arthralgia of lower leg (04/02/2012); Arthralgia of upper arm (06/18/2009); Cancer ([place]); Diabetes mellitus without complication ([place]); FH: atrial fibrillation; and Hypertension. Family: family history includes Dementia in his mother; Heart disease in his father.  Past Surgical History:  Procedure Laterality Date  . bladder cancer surgery  2015  . JOINT REPLACEMENT    . REPLACEMENT TOTAL HIP W/  RESURFACING IMPLANTS Bilateral   . SHOULDER SURGERY Bilateral   . TOTAL KNEE ARTHROPLASTY Bilateral    Constitutional Exam  General appearance: Well nourished, well developed, and well hydrated. In no apparent acute distress Vitals:   11/07/16 0918  BP: (!) 121/51  Pulse: 61  Resp: 16  Temp: 97.6 F (36.4 C)  TempSrc: Other (Comment)  SpO2: 97%  Weight: 230 lb (104.3 kg)  Height: '5\' 8"'  (1.727 m)   BMI Assessment: Estimated body mass index is 34.97 kg/m as calculated from the following:   Height as of this encounter: '5\' 8"'  (1.727 m).   Weight as of this encounter: 230 lb (104.3  kg).  BMI interpretation table: BMI level Category Range association with higher incidence of chronic pain  <18 kg/m2 Underweight   18.5-24.9 kg/m2 Ideal body weight   25-29.9 kg/m2 Overweight Increased incidence by 20%  30-34.9 kg/m2 Obese (Class I) Increased incidence by 68%  35-39.9 kg/m2 Severe obesity (Class II) Increased incidence by 136%  >40 kg/m2 Extreme obesity (Class III) Increased incidence by 254%   BMI Readings from Last 4 Encounters:  11/07/16 34.97 kg/m  09/05/16 34.97 kg/m  08/08/16 34.36 kg/m  05/11/16 34.21 kg/m   Wt Readings from Last 4 Encounters:  11/07/16 230 lb (104.3 kg)  09/05/16 230 lb (104.3 kg)  08/08/16 226 lb (102.5 kg)  05/11/16 225 lb (102.1 kg)  Psych/Mental status: Alert, oriented x 3 (person, place, & time)       Eyes: PERLA Respiratory: No evidence of acute respiratory distress  Cervical Spine Exam  Inspection: No masses, redness, or swelling Alignment: Symmetrical Functional ROM: Unrestricted ROM      Stability: No instability detected Muscle strength & Tone: Functionally intact Sensory: Unimpaired Palpation: No palpable anomalies              Upper Extremity (UE) Exam    Side: Right upper extremity  Side: Left upper extremity  Inspection: No masses, redness, swelling, or asymmetry. No contractures  Inspection: No masses, redness, swelling, or asymmetry. No contractures  Functional ROM: Decreased ROM          Functional ROM: Decreased ROM          Muscle strength & Tone: Functionally intact  Muscle strength & Tone: Functionally intact  Sensory: Unimpaired  Sensory: Unimpaired  Palpation: No palpable anomalies              Palpation: No palpable anomalies              Specialized Test(s): Deferred         Specialized Test(s): Deferred          Thoracic Spine Exam  Inspection: No masses, redness, or swelling Alignment: Symmetrical Functional ROM: Unrestricted ROM Stability: No instability detected Sensory: Unimpaired Muscle  strength & Tone: No palpable anomalies  Gait & Posture Assessment  Ambulation: Unassisted Gait: Relatively normal for  age and body habitus Posture: WNL   Lower Extremity Exam    Side: Right lower extremity  Side: Left lower extremity  Inspection: No masses, redness, swelling, or asymmetry. No contractures  Inspection: No masses, redness, swelling, or asymmetry. No contractures  Functional ROM: Unrestricted ROM          Functional ROM: Unrestricted ROM          Muscle strength & Tone: Functionally intact  Muscle strength & Tone: Functionally intact  Sensory: Unimpaired  Sensory: Unimpaired  Palpation: No palpable anomalies  Palpation: No palpable anomalies   Assessment  Primary Diagnosis & Pertinent Problem List: The primary encounter diagnosis was Chronic neck pain. Diagnoses of Chronic shoulder pain (Location of Primary Source of Pain) (Bilateral) (L>R), Chronic shoulder pain (Radicular), and Chronic pain syndrome were also pertinent to this visit.  Status Diagnosis  Controlled Controlled Controlled 1. Chronic neck pain   2. Chronic shoulder pain (Location of Primary Source of Pain) (Bilateral) (L>R)   3. Chronic shoulder pain (Radicular)   4. Chronic pain syndrome     Problems updated and reviewed during this visit: No problems updated. Plan of Care  Pharmacotherapy (Medications Ordered): Meds ordered this encounter  Medications  . oxyCODONE (OXY IR/ROXICODONE) 5 MG immediate release tablet    Sig: Take 1 tablet (5 mg total) by mouth 5 (five) times daily. Max: 5/day    Dispense:  35 tablet    Refill:  0    This is an opioid taper. Provide patient with medication in the exact order and day as requested. Patient may have prescription filled one day early if pharmacy is closed on scheduled refill date. Do not fill until: 11/15/16 To last until: 11/22/16  . oxyCODONE (OXY IR/ROXICODONE) 5 MG immediate release tablet    Sig: Take 1 tablet (5 mg total) by mouth 4 (four) times  daily. Max: 4/day    Dispense:  28 tablet    Refill:  0    This is an opioid taper. Provide patient with medication in the exact order and day as requested. Patient may have prescription filled one day early if pharmacy is closed on scheduled refill date. Do not fill until: 11/22/16 To last until: 11/29/16  . oxyCODONE (OXY IR/ROXICODONE) 5 MG immediate release tablet    Sig: Take 1 tablet (5 mg total) by mouth 3 (three) times daily. Max: 3/day    Dispense:  21 tablet    Refill:  0    This is an opioid taper. Provide patient with medication in the exact order and day as requested. Patient may have prescription filled one day early if pharmacy is closed on scheduled refill date. Do not fill until: 11/29/16 To last until: 12/06/16  . oxyCODONE (OXY IR/ROXICODONE) 5 MG immediate release tablet    Sig: Take 1 tablet (5 mg total) by mouth 2 (two) times daily. Max: 2/day    Dispense:  14 tablet    Refill:  0    This is an opioid taper. Provide patient with medication in the exact order and day as requested. Patient may have prescription filled one day early if pharmacy is closed on scheduled refill date. Do not fill until: 12/06/16 To last until: 12/13/16  . oxyCODONE (OXY IR/ROXICODONE) 5 MG immediate release tablet    Sig: Take 1 tablet (5 mg total) by mouth daily. Max: 1/day    Dispense:  7 tablet    Refill:  0    This is an opioid taper.  Provide patient with medication in the exact order and day as requested. Patient may have prescription filled one day early if pharmacy is closed on scheduled refill date. Do not fill until: 12/13/16 To last until: 12/20/16   New Prescriptions   No medications on file   Medications administered today: Mr. Buckle had no medications administered during this visit. Lab-work, procedure(s), and/or referral(s): No orders of the defined types were placed in this encounter.  Imaging and/or referral(s): None  Interventional therapies: Planned,  scheduled, and/or pending:  Opioid taper continued Drug holiday for 14 days then will start follow-up for restart of oxycodone.    Considering:   (Stop Coumadin for 5 days prior to procedure) Left sided suprascapular nerve radiofrequency ablation    Palliative PRN treatment(s):   (Stop Coumadin for 5 days prior to procedure) Suprascapular nerve block  Possible suprascapular nerve radiofrequency ablation    Provider-requested follow-up: Return in 7 weeks (on 12/26/2016) for MedMgmt, w/ NP.  Future Appointments Date Time Provider [place]  12/21/2016 10:30 AM Vevelyn Francois, NP [facility] None   Primary Care Physician: Neomia Dear, MD Location: [facility] Outpatient [facility] Management Facility Note by: Vevelyn Francois NP Date: 11/07/2016; Time: 9:33 AM  Pain Score Disclaimer: We use the NRS-11 scale. This is a self-reported, subjective measurement of pain severity with only modest accuracy. It is used primarily to identify changes within a particular patient. It must be understood that outpatient pain scales are significantly less accurate that those used for research, where they can be applied under ideal controlled circumstances with minimal exposure to variables. In reality, the score is likely to be a combination of pain intensity and pain affect, where pain affect describes the degree of emotional arousal or changes in action readiness caused by the sensory experience of pain. Factors such as social and work situation, setting, emotional state, anxiety levels, expectation, and prior pain experience may influence pain perception and show large inter-individual differences that may also be affected by time variables.  Patient instructions provided during this appointment: Patient Instructions   ____________________________________________________________________________________________  Medication Rules  Applies to: All patients receiving prescriptions (written or  electronic).  Pharmacy of record: Pharmacy where electronic prescriptions will be sent. If written prescriptions are taken to a different pharmacy, please inform the nursing staff. The pharmacy listed in the electronic medical record should be the one where you would like electronic prescriptions to be sent.  Prescription refills: Only during scheduled appointments. Applies to both, written and electronic prescriptions.  NOTE: The following applies primarily to controlled substances (Opioid Pain Medications)  Patient's responsibilities: 1. Pain Pills: Bring all pain pills to every appointment (except for procedure appointments). 2. Pill Bottles: Bring pills in original pharmacy bottle. Always bring newest bottle. Bring bottle, even if empty. 3. Medication refills: You are responsible for knowing and keeping track of what medications you need refilled. The day before your appointment, write a list of all prescriptions that need to be refilled. Bring that list to your appointment and give it to the admitting nurse. Prescriptions will be written only during appointments. If you forget a medication, it will not be "Called in", "Faxed", or "electronically sent". You will need to get another appointment to get these prescribed. 4. Prescription Accuracy: You are responsible for carefully inspecting your prescriptions before leaving our office. Have the discharge nurse carefully go over each prescription with you, before taking them home. Make sure that your name is accurately spelled, that your address is correct. Check  the name and dose of your medication to make sure it is accurate. Check the number of pills, and the written instructions to make sure they are clear and accurate. Make sure that you are given enough medication to last until your next medication refill appointment. 5. Taking Medication: Take medication as prescribed. Never take more pills than instructed. Never take medication more frequently  than prescribed. Taking less pills or less frequently is permitted and encouraged, when it comes to controlled substances (written prescriptions).  6. Inform other Doctors: Always inform, all of your healthcare providers, of all the medications you take. 7. Pain Medication from other Providers: You are not allowed to accept any additional pain medication from any other Doctor or Healthcare provider. There are two exceptions to this rule. (see below) In the event that you require additional pain medication, you are responsible for notifying us, as stated below. 8. Medication Agreement: You are responsible for carefully reading and following our Medication Agreement. This must be signed before receiving any prescriptions from our practice. Safely store a copy of your signed Agreement. Violations to the Agreement will result in no further prescriptions. (Additional copies of our Medication Agreement are available upon request.) 9. Laws, Rules, & Regulations: All patients are expected to follow all Federal and [employer], Rules, [employer]. Ignorance of the Laws does not constitute a valid excuse.  Exceptions: There are only two exceptions to the rule of not receiving pain medications from other Healthcare Providers. 1. Exception #1 (Emergencies): In the event of an emergency (i.e.: accident requiring emergency care), you are allowed to receive additional pain medication. However, you are responsible for: As soon as you are able, call our office [phone], at any time of the day or night, and leave a message stating your name, the date and nature of the emergency, and the name and dose of the medication prescribed. In the event that your call is answered by a member of our staff, make sure to document and save the date, time, and the name of the person that took your information.  2. Exception #2 (Planned Surgery): In the event that you are scheduled by another doctor or dentist to have any  type of surgery or procedure, you are allowed (for a period no longer than 30 days), to receive additional pain medication, for the acute post-op pain. However, in this case, you are responsible for picking up a copy of our "Post-op Pain Management for Surgeons" handout, and giving it to your surgeon or dentist. This document is available at our office, and does not require an appointment to obtain it. Simply go to our office during business hours (Monday-Thursday from 8:00 AM to 4:00 PM) (Friday 8:00 AM to 12:00 Noon) or if you have a scheduled appointment with [place], prior to your surgery, and ask for it by name. In addition, you will need to provide [place] with your name, name of your surgeon, type of surgery, and date of procedure or surgery.  ____________________________________________________________________________________________

## 2016-11-22 DIAGNOSIS — Z7901 Long term (current) use of anticoagulants: Secondary | ICD-10-CM | POA: Diagnosis not present

## 2016-11-22 DIAGNOSIS — I4891 Unspecified atrial fibrillation: Secondary | ICD-10-CM | POA: Diagnosis not present

## 2016-12-06 DIAGNOSIS — Z7901 Long term (current) use of anticoagulants: Secondary | ICD-10-CM | POA: Diagnosis not present

## 2016-12-06 DIAGNOSIS — I4891 Unspecified atrial fibrillation: Secondary | ICD-10-CM | POA: Diagnosis not present

## 2016-12-18 ENCOUNTER — Ambulatory Visit: Payer: Medicare Other | Admitting: Nurse Practitioner

## 2016-12-21 ENCOUNTER — Ambulatory Visit: Payer: Medicare Other | Attending: Nurse Practitioner | Admitting: Nurse Practitioner

## 2016-12-21 ENCOUNTER — Encounter: Payer: Self-pay | Admitting: Nurse Practitioner

## 2016-12-21 VITALS — BP 134/59 | HR 52 | Temp 97.7°F | Resp 16 | Ht 68.0 in | Wt 228.0 lb

## 2016-12-21 DIAGNOSIS — Z5181 Encounter for therapeutic drug level monitoring: Secondary | ICD-10-CM | POA: Insufficient documentation

## 2016-12-21 DIAGNOSIS — Z8249 Family history of ischemic heart disease and other diseases of the circulatory system: Secondary | ICD-10-CM | POA: Insufficient documentation

## 2016-12-21 DIAGNOSIS — Z96643 Presence of artificial hip joint, bilateral: Secondary | ICD-10-CM | POA: Diagnosis not present

## 2016-12-21 DIAGNOSIS — Z8551 Personal history of malignant neoplasm of bladder: Secondary | ICD-10-CM | POA: Diagnosis not present

## 2016-12-21 DIAGNOSIS — I4891 Unspecified atrial fibrillation: Secondary | ICD-10-CM | POA: Insufficient documentation

## 2016-12-21 DIAGNOSIS — Z79891 Long term (current) use of opiate analgesic: Secondary | ICD-10-CM | POA: Diagnosis not present

## 2016-12-21 DIAGNOSIS — G8929 Other chronic pain: Secondary | ICD-10-CM | POA: Diagnosis not present

## 2016-12-21 DIAGNOSIS — M25512 Pain in left shoulder: Secondary | ICD-10-CM | POA: Insufficient documentation

## 2016-12-21 DIAGNOSIS — F17210 Nicotine dependence, cigarettes, uncomplicated: Secondary | ICD-10-CM | POA: Insufficient documentation

## 2016-12-21 DIAGNOSIS — Z96653 Presence of artificial knee joint, bilateral: Secondary | ICD-10-CM | POA: Diagnosis not present

## 2016-12-21 DIAGNOSIS — M19011 Primary osteoarthritis, right shoulder: Secondary | ICD-10-CM

## 2016-12-21 DIAGNOSIS — M47816 Spondylosis without myelopathy or radiculopathy, lumbar region: Secondary | ICD-10-CM | POA: Diagnosis not present

## 2016-12-21 DIAGNOSIS — E119 Type 2 diabetes mellitus without complications: Secondary | ICD-10-CM | POA: Insufficient documentation

## 2016-12-21 DIAGNOSIS — M19012 Primary osteoarthritis, left shoulder: Secondary | ICD-10-CM | POA: Diagnosis not present

## 2016-12-21 DIAGNOSIS — M25511 Pain in right shoulder: Secondary | ICD-10-CM | POA: Diagnosis not present

## 2016-12-21 DIAGNOSIS — Z888 Allergy status to other drugs, medicaments and biological substances status: Secondary | ICD-10-CM | POA: Diagnosis not present

## 2016-12-21 DIAGNOSIS — Z79899 Other long term (current) drug therapy: Secondary | ICD-10-CM | POA: Diagnosis not present

## 2016-12-21 DIAGNOSIS — Z6834 Body mass index (BMI) 34.0-34.9, adult: Secondary | ICD-10-CM | POA: Insufficient documentation

## 2016-12-21 DIAGNOSIS — M5412 Radiculopathy, cervical region: Secondary | ICD-10-CM | POA: Diagnosis not present

## 2016-12-21 DIAGNOSIS — R209 Unspecified disturbances of skin sensation: Secondary | ICD-10-CM | POA: Insufficient documentation

## 2016-12-21 DIAGNOSIS — Z880 Allergy status to penicillin: Secondary | ICD-10-CM | POA: Diagnosis not present

## 2016-12-21 DIAGNOSIS — M25551 Pain in right hip: Secondary | ICD-10-CM | POA: Insufficient documentation

## 2016-12-21 DIAGNOSIS — M488X6 Other specified spondylopathies, lumbar region: Secondary | ICD-10-CM | POA: Insufficient documentation

## 2016-12-21 DIAGNOSIS — G894 Chronic pain syndrome: Secondary | ICD-10-CM | POA: Insufficient documentation

## 2016-12-21 DIAGNOSIS — Z7901 Long term (current) use of anticoagulants: Secondary | ICD-10-CM | POA: Insufficient documentation

## 2016-12-21 DIAGNOSIS — Z7984 Long term (current) use of oral hypoglycemic drugs: Secondary | ICD-10-CM | POA: Diagnosis not present

## 2016-12-21 DIAGNOSIS — M25552 Pain in left hip: Secondary | ICD-10-CM | POA: Insufficient documentation

## 2016-12-21 DIAGNOSIS — M62838 Other muscle spasm: Secondary | ICD-10-CM | POA: Insufficient documentation

## 2016-12-21 DIAGNOSIS — M1120 Other chondrocalcinosis, unspecified site: Secondary | ICD-10-CM | POA: Insufficient documentation

## 2016-12-21 DIAGNOSIS — M542 Cervicalgia: Secondary | ICD-10-CM | POA: Insufficient documentation

## 2016-12-21 MED ORDER — OXYCODONE HCL 5 MG PO TABS: 5.0000 mg | ORAL_TABLET | Freq: Four times a day (QID) | ORAL | 0 refills | Status: DC | PRN

## 2016-12-21 MED ORDER — OXYCODONE HCL 5 MG PO TABS: 5.0000 mg | ORAL_TABLET | Freq: Every day | ORAL | 0 refills | Status: DC

## 2016-12-21 MED ORDER — OXYCODONE HCL 5 MG PO TABS: 5.0000 mg | ORAL_TABLET | Freq: Two times a day (BID) | ORAL | 0 refills | Status: DC

## 2016-12-21 NOTE — Progress Notes (Signed)
Patient's Name: Aaron Short  MRN: [id number]  Referring Provider: Neomia Dear, MD  DOB: [birth year]  PCP: Neomia Dear, MD  DOS: 12/21/2016  Note by: Vevelyn Francois NP  Service setting: Ambulatory outpatient  Specialty: Interventional Pain Management  Location: [facility] (AMB) Pain Management Facility    Patient type: Established    Primary Reason(s) for Visit: Encounter for prescription drug management. (Level of risk: moderate)  CC: Shoulder Pain (left)  HPI  Aaron Short is a 71 y.o. year old, male patient, who comes today for a medication management evaluation. He has Long term current use of opiate analgesic; Long term prescription opiate use; Opiate use (97.5 MME/Day); Encounter for therapeutic drug level monitoring; Encounter for chronic pain management; Opioid dependence, daily use ([place]); Chronic hip pain (Location of Secondary source of pain) (Bilateral) (R>L); Chronic shoulder pain (Location of Primary Source of Pain) (Bilateral) (L>R); Chronic low back pain (Location of Tertiary source of pain) (Bilateral) (R>L); Chronic knee pain (Bilateral) (R>L); S/P THR: total hip replacement (Bilateral); S/P TKR: total knee replacement (Bilateral); Atrial fibrillation ([place]); Malignant neoplasm of urinary bladder ([place]); Type 2 diabetes mellitus ([place]); Muscle spasticity; Coumadin anticoagulation; Combined fat and carbohydrate induced hyperlipemia; Pure hypercholesterolemia; History of bladder cancer; History of hip fracture; History of pelvic fracture; History of anticoagulant therapy; Chronic neck pain; Chondrocalcinosis; Chronic shoulder pain (Radicular); History of shoulder surgery; Myofascial pain; Musculoskeletal pain; Muscle spasm; Lumbar facet syndrome (Bilateral) (R>L); Lumbar spondylosis; Morbid obesity ([place]); Continuous opioid dependence ([place]); Disturbance of skin sensation; Malignant neoplasm of bladder ([place]); Smoker; FH: atrial fibrillation; Malignant neoplasm of overlapping sites  of bladder ([place]); Obesity, Class I, BMI 30-34.9; Chronic pain syndrome; Hypertension, benign; Impaired fasting glucose; Chronic acromioclavicular joint pain (Left); Osteoarthritis of shoulder (Bilateral) (L>R); Acromioclavicular arthrosis (Bilateral) (L>R); Arthralgia of acromioclavicular joint (Bilateral) (L>R); and Essential hypertension on his problem list. His primarily concern today is the Shoulder Pain (left)  Pain Assessment: Location: Left Shoulder Radiating: left arm to approximately the finger tips Onset: More than a month ago Duration: Chronic pain Quality: Shooting, Dull Severity: 1 /10 (self-reported pain score)  Note: Reported level is compatible with observation.                   Effect on ADL: somewhat limiting  Timing: Constant Modifying factors: medications, analgesic creams  [name] was last scheduled for an appointment on 11/07/2016 for medication management. During today's appointment we reviewed Mr. Cattell chronic pain status, as well as his outpatient medication regimen. He admits that he has the constant dull ache. He states that the radicular symptoms come and go as a sharp pain. He continues on his narcotic taper of oxycodone. He admits that he was out of the medication for one week [place] He admits that he did go through withdrawal symptoms which were very uncomfortable which may him have to remain inside for one week.  The patient  reports that he does not use drugs. His body mass index is 34.67 kg/m.  Further details on both, my assessment(s), as well as the proposed treatment plan, please see below.  Controlled Substance Pharmacotherapy Assessment REMS (Risk Evaluation and Mitigation Strategy)  Analgesic:Oxycodone IR 5 mg every 8 hours (5 mg/dayof oxycodone)   MME:22 mEq per day Janett Billow, RN  12/21/2016 10:28 AM  Sign at close encounter Nursing Pain Medication Assessment:  Safety precautions to be  maintained throughout the outpatient stay will include: orient to surroundings, keep bed in low  position, maintain call bell within reach at all times, provide assistance with transfer out of bed and ambulation.  Medication Inspection Compliance: Pill count conducted under aseptic conditions, in front of the patient. Neither the pills nor the bottle was removed from the patient's sight at any time. Once count was completed pills were immediately returned to the patient in their original bottle.  Medication: Oxycodone IR Pill/Patch Count: 4 of 7 pills remain Pill/Patch Appearance: Markings consistent with prescribed medication Bottle Appearance: Standard pharmacy container. Clearly labeled. Filled Date: 07 / 24 / 2018 Last Medication intake:  Today   Pharmacokinetics: Liberation and absorption (onset of action): WNL Distribution (time to peak effect): WNL Metabolism and excretion (duration of action): WNL         Pharmacodynamics: Desired effects: Analgesia: Mr. Veltre reports >50% benefit. Functional ability: Patient reports that medication allows him to accomplish basic ADLs Clinically meaningful improvement in function (CMIF): Sustained CMIF goals met Perceived effectiveness: Described as relatively effective, allowing for increase in activities of daily living (ADL) Undesirable effects: Side-effects or Adverse reactions: None reported Monitoring: [place] PMP: Online review of the past 48-monthperiod conducted. Compliant with practice rules and regulations List of all UDS test(s) done:  Lab Results  Component Value Date   TOXASSSELUR FINAL 02/07/2016   TOXASSSELUR FINAL 11/17/2015   TPonderFINAL 08/16/2015   SUMMARY FINAL 12/21/2016   Last UDS on record: ToxAssure Select 13  Date Value Ref Range Status  02/07/2016 FINAL  Final    Comment:    ==================================================================== TOXASSURE SELECT 13  (MW) ==================================================================== Test                             Result       Flag       Units Drug Present and Declared for Prescription Verification   Morphine                       >7143        EXPECTED   ng/mg creat    Potential sources of large amounts of morphine in the absence of    codeine include administration of morphine or use of heroin.   Hydromorphone                  36           EXPECTED   ng/mg creat    Hydromorphone may be present as a metabolite of morphine;    concentrations of hydromorphone rarely exceed 5% of the morphine    concentration when this is the source of hydromorphone.   Oxycodone                      659          EXPECTED   ng/mg creat   Oxymorphone                    1874         EXPECTED   ng/mg creat   Noroxycodone                   1891         EXPECTED   ng/mg creat   Noroxymorphone                 401          EXPECTED   ng/mg creat  Sources of oxycodone are scheduled prescription medications.    Oxymorphone, noroxycodone, and noroxymorphone are expected    metabolites of oxycodone. Oxymorphone is also available as a    scheduled prescription medication. ==================================================================== Test                      Result    Flag   Units      Ref Range   Creatinine              140              mg/dL      >=20 ==================================================================== Declared Medications:  The flagging and interpretation on this report are based on the  following declared medications.  Unexpected results may arise from  inaccuracies in the declared medications.  **Note: The testing scope of this panel includes these medications:  Morphine (MS Contin)  Oxycodone (Oxycodone Acetaminophen)  **Note: The testing scope of this panel does not include following  reported medications:  Acetaminophen (Oxycodone Acetaminophen)  Carisoprodol (Soma)  Digoxin (Lanoxin)   Furosemide (Lasix)  Losartan (Cozaar)  Metformin (Glucophage)  Metoprolol (Toprol)  Naloxone  Simvastatin (Zocor)  Warfarin (Coumadin) ==================================================================== For clinical consultation, please call [phone]. ====================================================================    Summary  Date Value Ref Range Status  12/21/2016 FINAL  Final    Comment:    ==================================================================== TOXASSURE SELECT 13 (MW) ==================================================================== Test                             Result       Flag       Units Drug Present and Declared for Prescription Verification   Oxycodone                      575          EXPECTED   ng/mg creat   Oxymorphone                    2852         EXPECTED   ng/mg creat   Noroxycodone                   2074         EXPECTED   ng/mg creat   Noroxymorphone                 1090         EXPECTED   ng/mg creat    Sources of oxycodone are scheduled prescription medications.    Oxymorphone, noroxycodone, and noroxymorphone are expected    metabolites of oxycodone. Oxymorphone is also available as a    scheduled prescription medication. ==================================================================== Test                      Result    Flag   Units      Ref Range   Creatinine              145              mg/dL      >=20 ==================================================================== Declared Medications:  The flagging and interpretation on this report are based on the  following declared medications.  Unexpected results may arise from  inaccuracies in the declared medications.  **Note: The testing scope of this panel includes these medications:  Oxycodone  **Note: The testing scope of this panel   does not include following  reported medications:  Carisoprodol (Soma)  Digoxin  Furosemide  Losartan (Losartan Potassium)   Melatonin  Metformin  Metoprolol  Naloxone  Rosuvastatin  Simvastatin  Warfarin ==================================================================== For clinical consultation, please call [phone]. ====================================================================    UDS interpretation: Compliant          Medication Assessment Form: Reviewed. Patient indicates being compliant with therapy Treatment compliance: Compliant Risk Assessment Profile: Aberrant behavior: See prior evaluations. None observed or detected today Comorbid factors increasing risk of overdose: See prior notes. No additional risks detected today Risk of substance use disorder (SUD): Low Opioid Risk Tool (ORT) Total Score: 0  Interpretation Table:  Score <3 = Low Risk for SUD  Score between 4-7 = Moderate Risk for SUD  Score >8 = High Risk for Opioid Abuse   Risk Mitigation Strategies:  Patient Counseling: Covered Patient-Prescriber Agreement (PPA): Present and active  Notification to other healthcare providers: Done  Pharmacologic Plan: No change in therapy, at this time  Laboratory Chemistry  Inflammation Markers (CRP: Acute Phase) (ESR: Chronic Phase) Lab Results  Component Value Date   CRP <0.5 11/18/2015   ESRSEDRATE 6 11/18/2015                 Renal Function Markers Lab Results  Component Value Date   BUN 15 11/18/2015   CREATININE 0.80 11/18/2015   GFRAA >60 11/18/2015   GFRNONAA >60 11/18/2015                 Hepatic Function Markers Lab Results  Component Value Date   AST 19 11/18/2015   ALT 17 11/18/2015   ALBUMIN 4.1 11/18/2015   ALKPHOS 68 11/18/2015                 Electrolytes Lab Results  Component Value Date   NA 139 11/18/2015   K 4.3 11/18/2015   CL 103 11/18/2015   CALCIUM 9.3 11/18/2015   MG 1.8 11/18/2015                 Neuropathy Markers Lab Results  Component Value Date   VITAMINB12 268 11/18/2015                 Bone Pathology Markers Lab  Results  Component Value Date   ALKPHOS 68 11/18/2015   25OHVITD1 31 11/18/2015   25OHVITD2 <1.0 11/18/2015   25OHVITD3 30 11/18/2015   CALCIUM 9.3 11/18/2015                 Coagulation Parameters No results found for: INR, LABPROT, APTT, PLT               Cardiovascular Markers No results found for: BNP, HGB, HCT               Note: Lab results reviewed.  Recent Diagnostic Imaging Review  Mr Shoulder Left Wo Contrast  Result Date: 10/10/2016 CLINICAL DATA:  Shoulder pain and limited range of motion. Remote history of shoulder surgery. EXAM: MRI OF THE LEFT SHOULDER WITHOUT CONTRAST TECHNIQUE: Multiplanar, multisequence MR imaging of the shoulder was performed. No intravenous contrast was administered. COMPARISON:  Radiographs 11/18/2015 FINDINGS: Rotator cuff: Significant supraspinatus and infraspinatus tendinopathy/tendinosis with articular surface and interstitial tears extending back toward the musculotendinous junction region. No full-thickness retracted tear but probably at risk for such. The subscapularis tendon is intact. Muscles: Mild fatty atrophy of the shoulder musculature, in particular the supraspinatus and infraspinatus muscles. There are intramuscular ganglion cysts in the  infraspinatus muscle likely associated with articular surface tears. Biceps long head: Not visualized on any of the imaging sequences and likely chronically torn and retracted. Acromioclavicular Joint: Moderate degenerative changes. Type 1-2 acromion. Mild lateral downsloping and undersurface spurring. Glenohumeral Joint: Moderate degenerative changes with joint space narrowing an osteophytic spurring. Moderate degenerative chondrosis and small joint effusion. There is thickening of the capsular structures in the axillary recess which can be seen with adhesive capsulitis or synovitis. Labrum: The superior labrum is degenerated but no obvious tear. The anterior posterior labrum are grossly intact. Bones: No  acute bony findings. Subchondral cystic change noted in the humeral head. Other: Moderate subacromial/ subdeltoid bursitis. IMPRESSION: 1. Significant rotator cuff tendinopathy/tendinosis with interstitial and articular surface tears involving the supraspinatus and infraspinatus tendons. No full thickness retracted tear. 2. Likely chronically torn and retracted long head biceps tendon. 3. Moderate AC joint degenerative changes with mild lateral downsloping and undersurface spurring may contribute to bony impingement. 4. Moderate glenohumeral joint degenerative changes with synovitis or adhesive capsulitis. 5. Moderate subacromial/subdeltoid bursitis Electronically Signed   By: Marijo Sanes M.D.   On: 10/10/2016 14:16   Note: Imaging results reviewed.          Meds   Current Meds  Medication Sig  . carisoprodol (SOMA) 350 MG tablet Take 350 mg by mouth 2 (two) times daily.   . digoxin (DIGOX) 0.125 MG tablet TAKE 1 TABLET BY MOUTH EVERY DAY  . furosemide (LASIX) 20 MG tablet Take 20 mg by mouth as needed.   Marland Kitchen losartan (COZAAR) 50 MG tablet Take 50 mg by mouth daily.   . Melatonin 5 MG TABS Take 5 mg by mouth daily as needed.   . metFORMIN (GLUCOPHAGE) 500 MG tablet Take 500 mg by mouth daily with breakfast.   . metoprolol succinate (TOPROL-XL) 25 MG 24 hr tablet Take 25 mg by mouth.  . Naloxone HCl (NARCAN) 4 MG/0.1ML LIQD Place 1 spray into the nose once. Spray half of bottle content into each nostril, then call 911  . rosuvastatin (CRESTOR) 20 MG tablet Take 20 mg by mouth daily.   . simvastatin (ZOCOR) 40 MG tablet Take 40 mg by mouth at bedtime.   Marland Kitchen warfarin (COUMADIN) 2 MG tablet Take 2 mg by mouth daily. Monday, Wednesday friday   . warfarin (COUMADIN) 4 MG tablet Take 4 mg by mouth daily. Tuesday, Thursday, Saturday , sunday    ROS  Constitutional: Denies any fever or chills Gastrointestinal: No reported hemesis, hematochezia, vomiting, or acute GI distress Musculoskeletal: Denies any  acute onset joint swelling, redness, loss of ROM, or weakness Neurological: No reported episodes of acute onset apraxia, aphasia, dysarthria, agnosia, amnesia, paralysis, loss of coordination, or loss of consciousness  Allergies  Mr. Mimbs is allergic to diltiazem; penicillins; and tizanidine.  PFSH  Drug: Mr. Decesare  reports that he does not use drugs. Alcohol:  reports that he does not drink alcohol. Tobacco:  reports that he has been smoking Cigarettes.  He has never used smokeless tobacco. Medical:  has a past medical history of Arthralgia of lower leg (04/02/2012); Arthralgia of upper arm (06/18/2009); Cancer ([place]); Diabetes mellitus without complication ([place]); FH: atrial fibrillation; and Hypertension. Surgical: Mr. Magnan  has a past surgical history that includes bladder cancer surgery (2015); Joint replacement; Replacement total hip w/  resurfacing implants (Bilateral); Total knee arthroplasty (Bilateral); and Shoulder surgery (Bilateral). Family: family history includes Dementia in his mother; Heart disease in his father.  Constitutional Exam  General  appearance: Well nourished, well developed, and well hydrated. In no apparent acute distress Vitals:   12/21/16 1017  BP: (!) 134/59  Pulse: (!) 52  Resp: 16  Temp: 97.7 F (36.5 C)  TempSrc: Axillary  SpO2: 99%  Weight: 228 lb (103.4 kg)  Height: 5' 8" (1.727 m)   BMI Assessment: Estimated body mass index is 34.67 kg/m as calculated from the following:   Height as of this encounter: 5' 8" (1.727 m).   Weight as of this encounter: 228 lb (103.4 kg).  BMI interpretation table: BMI level Category Range association with higher incidence of chronic pain  <18 kg/m2 Underweight   18.5-24.9 kg/m2 Ideal body weight   25-29.9 kg/m2 Overweight Increased incidence by 20%  30-34.9 kg/m2 Obese (Class I) Increased incidence by 68%  35-39.9 kg/m2 Severe obesity (Class II) Increased incidence by 136%  >40 kg/m2 Extreme  obesity (Class III) Increased incidence by 254%   BMI Readings from Last 4 Encounters:  12/21/16 34.67 kg/m  11/07/16 34.97 kg/m  09/05/16 34.97 kg/m  08/08/16 34.36 kg/m   Wt Readings from Last 4 Encounters:  12/21/16 228 lb (103.4 kg)  11/07/16 230 lb (104.3 kg)  09/05/16 230 lb (104.3 kg)  08/08/16 226 lb (102.5 kg)  Psych/Mental status: Alert, oriented x 3 (person, place, & time)       Eyes: PERLA Respiratory: No evidence of acute respiratory distress  Cervical Spine Exam  Inspection: No masses, redness, or swelling Alignment: Symmetrical Functional ROM: Unrestricted ROM      Stability: No instability detected Muscle strength & Tone: Functionally intact Sensory: Unimpaired Palpation: No palpable anomalies              Upper Extremity (UE) Exam    Side: Right upper extremity  Side: Left upper extremity  Inspection: No masses, redness, swelling, or asymmetry. No contractures  Inspection: No masses, redness, swelling, or asymmetry. No contractures  Functional ROM: Unrestricted ROM          Functional ROM: Unrestricted ROM          Muscle strength & Tone: Functionally intact  Muscle strength & Tone: Functionally intact  Sensory: Unimpaired  Sensory: Unimpaired  Palpation: No palpable anomalies              Palpation: No palpable anomalies              Specialized Test(s): Deferred         Specialized Test(s): Deferred          Thoracic Spine Exam  Inspection: No masses, redness, or swelling Alignment: Symmetrical Functional ROM: Unrestricted ROM Stability: No instability detected Sensory: Unimpaired Muscle strength & Tone: No palpable anomalies  Lumbar Spine Exam  Inspection: No masses, redness, or swelling Alignment: Symmetrical Functional ROM: Unrestricted ROM      Stability: No instability detected Muscle strength & Tone: Functionally intact Sensory: Unimpaired Palpation: No palpable anomalies       Provocative Tests: Lumbar Hyperextension and rotation test:  evaluation deferred today       Lumbar Lateral bending test: evaluation deferred today       Patrick's Maneuver: evaluation deferred today                    Gait & Posture Assessment  Ambulation: Unassisted Gait: Relatively normal for age and body habitus Posture: WNL   Lower Extremity Exam    Side: Right lower extremity  Side: Left lower extremity  Inspection: No masses, redness, swelling, or  asymmetry. No contractures  Inspection: No masses, redness, swelling, or asymmetry. No contractures  Functional ROM: Unrestricted ROM          Functional ROM: Unrestricted ROM          Muscle strength & Tone: Functionally intact  Muscle strength & Tone: Functionally intact  Sensory: Unimpaired  Sensory: Unimpaired  Palpation: No palpable anomalies  Palpation: No palpable anomalies   Assessment  Primary Diagnosis & Pertinent Problem List: The primary encounter diagnosis was Chronic pain syndrome. Diagnoses of Chronic shoulder pain (Location of Primary Source of Pain) (Bilateral) (L>R), Chronic shoulder pain (Radicular), and Osteoarthritis of shoulder (Bilateral) (L>R) were also pertinent to this visit.  Status Diagnosis  Controlled Controlled Controlled 1. Chronic pain syndrome   2. Chronic shoulder pain (Location of Primary Source of Pain) (Bilateral) (L>R)   3. Chronic shoulder pain (Radicular)   4. Osteoarthritis of shoulder (Bilateral) (L>R)     Problems updated and reviewed during this visit: No problems updated. Plan of Care  Pharmacotherapy (Medications Ordered): Meds ordered this encounter  Medications  . oxyCODONE (OXY IR/ROXICODONE) 5 MG immediate release tablet    Sig: Take 1 tablet (5 mg total) by mouth 2 (two) times daily. Max: 2/day    Dispense:  14 tablet    Refill:  0    This prescription is part of a downward opioid taper. Fill instructions must be followed exactly as written to avoid withdrawal. Fill date: 12/21/16 To last until: 12/28/16  . oxyCODONE (OXY  IR/ROXICODONE) 5 MG immediate release tablet    Sig: Take 1 tablet (5 mg total) by mouth daily. Max: 1/day    Dispense:  7 tablet    Refill:  0    This prescription is part of a downward opioid taper. Fill instructions must be followed exactly as written to avoid withdrawal. Fill date: 12/28/16 To last until: 01/04/17  . oxyCODONE (OXY IR/ROXICODONE) 5 MG immediate release tablet    Sig: Take 1 tablet (5 mg total) by mouth every 6 (six) hours as needed for severe pain.    Dispense:  120 tablet    Refill:  0    Do not place this medication, or any other prescription from our practice, on "Automatic Refill". Patient may have prescription filled one day early if pharmacy is closed on scheduled refill date. Do not fill until: 01/19/17 To last until: 02/18/17   New Prescriptions   OXYCODONE (OXY IR/ROXICODONE) 5 MG IMMEDIATE RELEASE TABLET    Take 1 tablet (5 mg total) by mouth 2 (two) times daily. Max: 2/day   OXYCODONE (OXY IR/ROXICODONE) 5 MG IMMEDIATE RELEASE TABLET    Take 1 tablet (5 mg total) by mouth daily. Max: 1/day   OXYCODONE (OXY IR/ROXICODONE) 5 MG IMMEDIATE RELEASE TABLET    Take 1 tablet (5 mg total) by mouth every 6 (six) hours as needed for severe pain.   Medications administered today: Mr. Bonebrake had no medications administered during this visit. Lab-work, procedure(s), and/or referral(s): Orders Placed This Encounter  Procedures  . ToxASSURE Select 13 (MW), Urine   Imaging and/or referral(s): None  Interventional therapies: Planned, scheduled, and/or pending:   None at this time    Considering:   (Stop Coumadin for 5 days prior to procedure) Left sided suprascapular nerve radiofrequency ablation    Palliative PRN treatment(s):   (Stop Coumadin for 5 days prior to procedure) Suprascapular nerve block  Possible suprascapular nerve radiofrequency ablation    Provider-requested follow-up: No Follow-up on file.  Future Appointments Date Time Provider  Department Center  02/13/2017 10:30 AM King, Crystal M, NP [facility] None   Primary Care Physician: Robertson, Noelle, MD Location: [facility] Note by: Crystal M. King NP Date: 12/21/2016; Time: 4:21 PM  Pain Score Disclaimer: We use the NRS-11 scale. This is a self-reported, subjective measurement of pain severity with only modest accuracy. It is used primarily to identify changes within a particular patient. It must be understood that outpatient pain scales are significantly less accurate that those used for research, where they can be applied under ideal controlled circumstances with minimal exposure to variables. In reality, the score is likely to be a combination of pain intensity and pain affect, where pain affect describes the degree of emotional arousal or changes in action readiness caused by the sensory experience of pain. Factors such as social and work situation, setting, emotional state, anxiety levels, expectation, and prior pain experience may influence pain perception and show large inter-individual differences that may also be affected by time variables.  Patient instructions provided during this appointment: Patient Instructions  You have been given 3 prescriptions for oxycodone.  You are tapering this off and the instructions have been given by C. King NP and they are also labeled on your bottle.  You will be getting a UDS today as well.  ____________________________________________________________________________________________  Medication Rules  Applies to: All patients receiving prescriptions (written or electronic).  Pharmacy of record: Pharmacy where electronic prescriptions will be sent. If written prescriptions are taken to a different pharmacy, please inform the nursing staff. The pharmacy listed in the electronic medical record should be the one where you would like electronic prescriptions to be sent.  Prescription refills: Only during  scheduled appointments. Applies to both, written and electronic prescriptions.  NOTE: The following applies primarily to controlled substances (Opioid* Pain Medications).   Patient's responsibilities: 1. Pain Pills: Bring all pain pills to every appointment (except for procedure appointments). 2. Pill Bottles: Bring pills in original pharmacy bottle. Always bring newest bottle. Bring bottle, even if empty. 3. Medication refills: You are responsible for knowing and keeping track of what medications you need refilled. The day before your appointment, write a list of all prescriptions that need to be refilled. Bring that list to your appointment and give it to the admitting nurse. Prescriptions will be written only during appointments. If you forget a medication, it will not be "Called in", "Faxed", or "electronically sent". You will need to get another appointment to get these prescribed. 4. Prescription Accuracy: You are responsible for carefully inspecting your prescriptions before leaving our office. Have the discharge nurse carefully go over each prescription with you, before taking them home. Make sure that your name is accurately spelled, that your address is correct. Check the name and dose of your medication to make sure it is accurate. Check the number of pills, and the written instructions to make sure they are clear and accurate. Make sure that you are given enough medication to last until your next medication refill appointment. 5. Taking Medication: Take medication as prescribed. Never take more pills than instructed. Never take medication more frequently than prescribed. Taking less pills or less frequently is permitted and encouraged, when it comes to controlled substances (written prescriptions).  6. Inform other Doctors: Always inform, all of your healthcare providers, of all the medications you take. 7. Pain Medication from other Providers: You are not allowed to accept any additional pain  medication from any other Doctor   or Healthcare provider. There are two exceptions to this rule. (see below) In the event that you require additional pain medication, you are responsible for notifying us, as stated below. 8. Medication Agreement: You are responsible for carefully reading and following our Medication Agreement. This must be signed before receiving any prescriptions from our practice. Safely store a copy of your signed Agreement. Violations to the Agreement will result in no further prescriptions. (Additional copies of our Medication Agreement are available upon request.) 9. Laws, Rules, & Regulations: All patients are expected to follow all Federal and [employer], [employer], Rules, [employer]. Ignorance of the Laws does not constitute a valid excuse. The use of any illegal substances is prohibited. 10. Adopted CDC guidelines & recommendations: Target dosing levels will be at or below 60 MME/day. Use of benzodiazepines** is not recommended.  Exceptions: There are only two exceptions to the rule of not receiving pain medications from other Healthcare Providers. 1. Exception #1 (Emergencies): In the event of an emergency (i.e.: accident requiring emergency care), you are allowed to receive additional pain medication. However, you are responsible for: As soon as you are able, call our office [phone], at any time of the day or night, and leave a message stating your name, the date and nature of the emergency, and the name and dose of the medication prescribed. In the event that your call is answered by a member of our staff, make sure to document and save the date, time, and the name of the person that took your information.  2. Exception #2 (Planned Surgery): In the event that you are scheduled by another doctor or dentist to have any type of surgery or procedure, you are allowed (for a period no longer than 30 days), to receive additional pain medication, for the acute post-op pain.  However, in this case, you are responsible for picking up a copy of our "Post-op Pain Management for Surgeons" handout, and giving it to your surgeon or dentist. This document is available at our office, and does not require an appointment to obtain it. Simply go to our office during business hours (Monday-Thursday from 8:00 AM to 4:00 PM) (Friday 8:00 AM to 12:00 Noon) or if you have a scheduled appointment with [place], prior to your surgery, and ask for it by name. In addition, you will need to provide [place] with your name, name of your surgeon, type of surgery, and date of procedure or surgery.  *Opioid medications include: morphine, codeine, oxycodone, oxymorphone, hydrocodone, hydromorphone, meperidine, tramadol, tapentadol, buprenorphine, fentanyl, methadone. **Benzodiazepine medications include: diazepam (Valium), alprazolam (Xanax), clonazepam (Klonopine), lorazepam (Ativan), clorazepate (Tranxene), chlordiazepoxide (Librium), estazolam (Prosom), oxazepam (Serax), temazepam (Restoril), triazolam (Halcion)  ____________________________________________________________________________________________

## 2016-12-21 NOTE — Progress Notes (Signed)
Nursing Pain Medication Assessment:  Safety precautions to be maintained throughout the outpatient stay will include: orient to surroundings, keep bed in low position, maintain call bell within reach at all times, provide assistance with transfer out of bed and ambulation.  Medication Inspection Compliance: Pill count conducted under aseptic conditions, in front of the patient. Neither the pills nor the bottle was removed from the patient's sight at any time. Once count was completed pills were immediately returned to the patient in their original bottle.  Medication: Oxycodone IR Pill/Patch Count: 4 of 7 pills remain Pill/Patch Appearance: Markings consistent with prescribed medication Bottle Appearance: Standard pharmacy container. Clearly labeled. Filled Date: 07 / 24 / 2018 Last Medication intake:  Today

## 2016-12-21 NOTE — Patient Instructions (Addendum)
You have been given 3 prescriptions for oxycodone.  You are tapering this off and the instructions have been given by Corky Downs NP and they are also labeled on your bottle.  You will be getting a UDS today as well.  ____________________________________________________________________________________________  Medication Rules  Applies to: All patients receiving prescriptions (written or electronic).  Pharmacy of record: Pharmacy where electronic prescriptions will be sent. If written prescriptions are taken to a different pharmacy, please inform the nursing staff. The pharmacy listed in the electronic medical record should be the one where you would like electronic prescriptions to be sent.  Prescription refills: Only during scheduled appointments. Applies to both, written and electronic prescriptions.  NOTE: The following applies primarily to controlled substances (Opioid* Pain Medications).   Patient's responsibilities: 1. Pain Pills: Bring all pain pills to every appointment (except for procedure appointments). 2. Pill Bottles: Bring pills in original pharmacy bottle. Always bring newest bottle. Bring bottle, even if empty. 3. Medication refills: You are responsible for knowing and keeping track of what medications you need refilled. The day before your appointment, write a list of all prescriptions that need to be refilled. Bring that list to your appointment and give it to the admitting nurse. Prescriptions will be written only during appointments. If you forget a medication, it will not be "Called in", "Faxed", or "electronically sent". You will need to get another appointment to get these prescribed. 4. Prescription Accuracy: You are responsible for carefully inspecting your prescriptions before leaving our office. Have the discharge nurse carefully go over each prescription with you, before taking them home. Make sure that your name is accurately spelled, that your address is correct. Check the  name and dose of your medication to make sure it is accurate. Check the number of pills, and the written instructions to make sure they are clear and accurate. Make sure that you are given enough medication to last until your next medication refill appointment. 5. Taking Medication: Take medication as prescribed. Never take more pills than instructed. Never take medication more frequently than prescribed. Taking less pills or less frequently is permitted and encouraged, when it comes to controlled substances (written prescriptions).  6. Inform other Doctors: Always inform, all of your healthcare providers, of all the medications you take. 7. Pain Medication from other Providers: You are not allowed to accept any additional pain medication from any other Doctor or Healthcare provider. There are two exceptions to this rule. (see below) In the event that you require additional pain medication, you are responsible for notifying us, as stated below. 8. Medication Agreement: You are responsible for carefully reading and following our Medication Agreement. This must be signed before receiving any prescriptions from our practice. Safely store a copy of your signed Agreement. Violations to the Agreement will result in no further prescriptions. (Additional copies of our Medication Agreement are available upon request.) 9. Laws, Rules, & Regulations: All patients are expected to follow all Federal and [employer], [employer], Rules, [employer]. Ignorance of the Laws does not constitute a valid excuse. The use of any illegal substances is prohibited. 10. Adopted CDC guidelines & recommendations: Target dosing levels will be at or below 60 MME/day. Use of benzodiazepines** is not recommended.  Exceptions: There are only two exceptions to the rule of not receiving pain medications from other Healthcare Providers. 1. Exception #1 (Emergencies): In the event of an emergency (i.e.: accident requiring emergency care), you  are allowed to receive additional pain medication. However, you are  responsible for: As soon as you are able, call our office [phone], at any time of the day or night, and leave a message stating your name, the date and nature of the emergency, and the name and dose of the medication prescribed. In the event that your call is answered by a member of our staff, make sure to document and save the date, time, and the name of the person that took your information.  2. Exception #2 (Planned Surgery): In the event that you are scheduled by another doctor or dentist to have any type of surgery or procedure, you are allowed (for a period no longer than 30 days), to receive additional pain medication, for the acute post-op pain. However, in this case, you are responsible for picking up a copy of our "Post-op Pain Management for Surgeons" handout, and giving it to your surgeon or dentist. This document is available at our office, and does not require an appointment to obtain it. Simply go to our office during business hours (Monday-Thursday from 8:00 AM to 4:00 PM) (Friday 8:00 AM to 12:00 Noon) or if you have a scheduled appointment with [place], prior to your surgery, and ask for it by name. In addition, you will need to provide [place] with your name, name of your surgeon, type of surgery, and date of procedure or surgery.  *Opioid medications include: morphine, codeine, oxycodone, oxymorphone, hydrocodone, hydromorphone, meperidine, tramadol, tapentadol, buprenorphine, fentanyl, methadone. **Benzodiazepine medications include: diazepam (Valium), alprazolam (Xanax), clonazepam (Klonopine), lorazepam (Ativan), clorazepate (Tranxene), chlordiazepoxide (Librium), estazolam (Prosom), oxazepam (Serax), temazepam (Restoril), triazolam (Halcion)  ____________________________________________________________________________________________

## 2016-12-28 LAB — TOXASSURE SELECT 13 (MW), URINE

## 2017-01-03 DIAGNOSIS — Z7901 Long term (current) use of anticoagulants: Secondary | ICD-10-CM | POA: Diagnosis not present

## 2017-01-03 DIAGNOSIS — M62838 Other muscle spasm: Secondary | ICD-10-CM | POA: Diagnosis not present

## 2017-01-03 DIAGNOSIS — I4891 Unspecified atrial fibrillation: Secondary | ICD-10-CM | POA: Diagnosis not present

## 2017-01-03 DIAGNOSIS — M6240 Contracture of muscle, unspecified site: Secondary | ICD-10-CM | POA: Diagnosis not present

## 2017-02-07 DIAGNOSIS — Z7901 Long term (current) use of anticoagulants: Secondary | ICD-10-CM | POA: Diagnosis not present

## 2017-02-07 DIAGNOSIS — I4891 Unspecified atrial fibrillation: Secondary | ICD-10-CM | POA: Diagnosis not present

## 2017-02-13 ENCOUNTER — Ambulatory Visit: Payer: Medicare Other | Attending: Nurse Practitioner | Admitting: Nurse Practitioner

## 2017-02-13 ENCOUNTER — Encounter: Payer: Self-pay | Admitting: Nurse Practitioner

## 2017-02-13 VITALS — BP 121/77 | HR 55 | Temp 98.0°F | Resp 16 | Ht 68.0 in | Wt 222.0 lb

## 2017-02-13 DIAGNOSIS — M25512 Pain in left shoulder: Secondary | ICD-10-CM | POA: Insufficient documentation

## 2017-02-13 DIAGNOSIS — G894 Chronic pain syndrome: Secondary | ICD-10-CM | POA: Diagnosis not present

## 2017-02-13 DIAGNOSIS — Z96653 Presence of artificial knee joint, bilateral: Secondary | ICD-10-CM | POA: Diagnosis not present

## 2017-02-13 DIAGNOSIS — F17210 Nicotine dependence, cigarettes, uncomplicated: Secondary | ICD-10-CM | POA: Insufficient documentation

## 2017-02-13 DIAGNOSIS — E119 Type 2 diabetes mellitus without complications: Secondary | ICD-10-CM | POA: Diagnosis not present

## 2017-02-13 DIAGNOSIS — I10 Essential (primary) hypertension: Secondary | ICD-10-CM | POA: Insufficient documentation

## 2017-02-13 DIAGNOSIS — Z7901 Long term (current) use of anticoagulants: Secondary | ICD-10-CM | POA: Diagnosis not present

## 2017-02-13 DIAGNOSIS — M19012 Primary osteoarthritis, left shoulder: Secondary | ICD-10-CM | POA: Diagnosis not present

## 2017-02-13 DIAGNOSIS — I4891 Unspecified atrial fibrillation: Secondary | ICD-10-CM | POA: Diagnosis not present

## 2017-02-13 DIAGNOSIS — G8929 Other chronic pain: Secondary | ICD-10-CM

## 2017-02-13 DIAGNOSIS — Z7984 Long term (current) use of oral hypoglycemic drugs: Secondary | ICD-10-CM | POA: Diagnosis not present

## 2017-02-13 DIAGNOSIS — M25511 Pain in right shoulder: Secondary | ICD-10-CM | POA: Diagnosis not present

## 2017-02-13 DIAGNOSIS — F329 Major depressive disorder, single episode, unspecified: Secondary | ICD-10-CM | POA: Diagnosis not present

## 2017-02-13 DIAGNOSIS — M19011 Primary osteoarthritis, right shoulder: Secondary | ICD-10-CM | POA: Diagnosis not present

## 2017-02-13 DIAGNOSIS — Z79891 Long term (current) use of opiate analgesic: Secondary | ICD-10-CM | POA: Insufficient documentation

## 2017-02-13 DIAGNOSIS — E7801 Familial hypercholesterolemia: Secondary | ICD-10-CM | POA: Insufficient documentation

## 2017-02-13 DIAGNOSIS — Z96643 Presence of artificial hip joint, bilateral: Secondary | ICD-10-CM | POA: Diagnosis not present

## 2017-02-13 MED ORDER — OXYCODONE HCL 5 MG PO TABS: 5.0000 mg | ORAL_TABLET | Freq: Four times a day (QID) | ORAL | 0 refills | Status: DC | PRN

## 2017-02-13 NOTE — Patient Instructions (Addendum)
____________________________________________________________________________________________  Medication Rules  Applies to: All patients receiving prescriptions (written or electronic).  Pharmacy of record: Pharmacy where electronic prescriptions will be sent. If written prescriptions are taken to a different pharmacy, please inform the nursing staff. The pharmacy listed in the electronic medical record should be the one where you would like electronic prescriptions to be sent.  Prescription refills: Only during scheduled appointments. Applies to both, written and electronic prescriptions.  NOTE: The following applies primarily to controlled substances (Opioid* Pain Medications).   Patient's responsibilities: 1. Pain Pills: Bring all pain pills to every appointment (except for procedure appointments). 2. Pill Bottles: Bring pills in original pharmacy bottle. Always bring newest bottle. Bring bottle, even if empty. 3. Medication refills: You are responsible for knowing and keeping track of what medications you need refilled. The day before your appointment, write a list of all prescriptions that need to be refilled. Bring that list to your appointment and give it to the admitting nurse. Prescriptions will be written only during appointments. If you forget a medication, it will not be "Called in", "Faxed", or "electronically sent". You will need to get another appointment to get these prescribed. 4. Prescription Accuracy: You are responsible for carefully inspecting your prescriptions before leaving our office. Have the discharge nurse carefully go over each prescription with you, before taking them home. Make sure that your name is accurately spelled, that your address is correct. Check the name and dose of your medication to make sure it is accurate. Check the number of pills, and the written instructions to make sure they are clear and accurate. Make sure that you are given enough medication to  last until your next medication refill appointment. 5. Taking Medication: Take medication as prescribed. Never take more pills than instructed. Never take medication more frequently than prescribed. Taking less pills or less frequently is permitted and encouraged, when it comes to controlled substances (written prescriptions).  6. Inform other Doctors: Always inform, all of your healthcare providers, of all the medications you take. 7. Pain Medication from other Providers: You are not allowed to accept any additional pain medication from any other Doctor or Healthcare provider. There are two exceptions to this rule. (see below) In the event that you require additional pain medication, you are responsible for notifying us, as stated below. 8. Medication Agreement: You are responsible for carefully reading and following our Medication Agreement. This must be signed before receiving any prescriptions from our practice. Safely store a copy of your signed Agreement. Violations to the Agreement will result in no further prescriptions. (Additional copies of our Medication Agreement are available upon request.) 9. Laws, Rules, & Regulations: All patients are expected to follow all Federal and State Laws, Statutes, Rules, & Regulations. Ignorance of the Laws does not constitute a valid excuse. The use of any illegal substances is prohibited. 10. Adopted CDC guidelines & recommendations: Target dosing levels will be at or below 60 MME/day. Use of benzodiazepines** is not recommended.  Exceptions: There are only two exceptions to the rule of not receiving pain medications from other Healthcare Providers. 1. Exception #1 (Emergencies): In the event of an emergency (i.e.: accident requiring emergency care), you are allowed to receive additional pain medication. However, you are responsible for: As soon as you are able, call our office [phone], at any time of the day or night, and leave a message stating your  name, the date and nature of the emergency, and the name and dose of the medication   prescribed. In the event that your call is answered by a member of our staff, make sure to document and save the date, time, and the name of the person that took your information.  2. Exception #2 (Planned Surgery): In the event that you are scheduled by another doctor or dentist to have any type of surgery or procedure, you are allowed (for a period no longer than 30 days), to receive additional pain medication, for the acute post-op pain. However, in this case, you are responsible for picking up a copy of our "Post-op Pain Management for Surgeons" handout, and giving it to your surgeon or dentist. This document is available at our office, and does not require an appointment to obtain it. Simply go to our office during business hours (Monday-Thursday from 8:00 AM to 4:00 PM) (Friday 8:00 AM to 12:00 Noon) or if you have a scheduled appointment with [place], prior to your surgery, and ask for it by name. In addition, you will need to provide [place] with your name, name of your surgeon, type of surgery, and date of procedure or surgery.  *Opioid medications include: morphine, codeine, oxycodone, oxymorphone, hydrocodone, hydromorphone, meperidine, tramadol, tapentadol, buprenorphine, fentanyl, methadone. **Benzodiazepine medications include: diazepam (Valium), alprazolam (Xanax), clonazepam (Klonopine), lorazepam (Ativan), clorazepate (Tranxene), chlordiazepoxide (Librium), estazolam (Prosom), oxazepam (Serax), temazepam (Restoril), triazolam (Halcion)  ____________________________________________________________________________________________ BMI Assessment: Estimated body mass index is 33.75 kg/m as calculated from the following:   Height as of this encounter: 5\' 8"  (1.727 m).   Weight as of this encounter: 222 lb (100.7 kg).  BMI interpretation table: BMI level Category Range association with higher incidence of chronic pain   <18 kg/m2 Underweight   18.5-24.9 kg/m2 Ideal body weight   25-29.9 kg/m2 Overweight Increased incidence by 20%  30-34.9 kg/m2 Obese (Class I) Increased incidence by 68%  35-39.9 kg/m2 Severe obesity (Class II) Increased incidence by 136%  >40 kg/m2 Extreme obesity (Class III) Increased incidence by 254%   BMI Readings from Last 4 Encounters:  02/13/17 33.75 kg/m  12/21/16 34.67 kg/m  11/07/16 34.97 kg/m  09/05/16 34.97 kg/m   Wt Readings from Last 4 Encounters:  02/13/17 222 lb (100.7 kg)  12/21/16 228 lb (103.4 kg)  11/07/16 230 lb (104.3 kg)  09/05/16 230 lb (104.3 kg)

## 2017-02-13 NOTE — Progress Notes (Signed)
Patient's Name: Aaron Short  MRN: [id number]  Referring Provider: Neomia Dear, MD  DOB: 06/22/1945  PCP: Neomia Dear, MD  DOS: 02/13/2017  Note by: Vevelyn Francois NP  Service setting: Ambulatory outpatient  Specialty: Interventional Pain Management  Location: [facility] (AMB) Pain Management Facility    Patient type: Established    Primary Reason(s) for Visit: Encounter for prescription drug management. (Level of risk: moderate)  CC: Shoulder Pain (left)  HPI  Aaron Short is a 71 y.o. year old, male patient, who comes today for a medication management evaluation. He has Long term current use of opiate analgesic; Long term prescription opiate use; Opiate use (97.5 MME/Day); Encounter for therapeutic drug level monitoring; Encounter for chronic pain management; Opioid dependence, daily use ([place]); Chronic hip pain (Location of Secondary source of pain) (Bilateral) (R>L); Chronic shoulder pain (Location of Primary Source of Pain) (Bilateral) (L>R); Chronic low back pain (Location of Tertiary source of pain) (Bilateral) (R>L); Chronic knee pain (Bilateral) (R>L); S/P THR: total hip replacement (Bilateral); S/P TKR: total knee replacement (Bilateral); Atrial fibrillation ([place]); Malignant neoplasm of urinary bladder ([place]); Type 2 diabetes mellitus ([place]); Muscle spasticity; Coumadin anticoagulation; Combined fat and carbohydrate induced hyperlipemia; Pure hypercholesterolemia; History of bladder cancer; History of hip fracture; History of pelvic fracture; History of anticoagulant therapy; Chronic neck pain; Chondrocalcinosis; Chronic shoulder pain ([place]); History of shoulder surgery; Myofascial pain; Musculoskeletal pain; Muscle spasm; Lumbar facet syndrome (Bilateral) (R>L); Lumbar spondylosis; Morbid obesity ([place]); Continuous opioid dependence ([place]); Disturbance of skin sensation; Malignant neoplasm of bladder ([place]); Smoker; FH: atrial fibrillation; Malignant neoplasm of overlapping sites  of bladder ([place]); Obesity, Class I, BMI 30-34.9; Chronic pain syndrome; Hypertension, benign; Impaired fasting glucose; Chronic acromioclavicular joint pain (Left); Osteoarthritis of shoulder (Bilateral) (L>R); Acromioclavicular arthrosis (Bilateral) (L>R); Arthralgia of acromioclavicular joint (Bilateral) (L>R); and Essential hypertension on his problem list. His primarily concern today is the Shoulder Pain (left)  Pain Assessment: Location: Left Shoulder Radiating: radiates down left arm to fingers Onset: More than a month ago Duration: Chronic pain Quality: Dull, Sh[place] Severity: 3 /10 (self-reported pain score)  Note: Reported level is compatible with observation.                   Effect on ADL:   Timing: Intermittent Modifying factors:    Aaron Short was last scheduled for an appointment on 12/21/2016 for medication management. During today's appointment we reviewed Aaron Short chronic pain status, as well as his outpatient medication regimen. He is states that he wants to have shoulder replacements. He admits that he is not a good surgical candidate secondary to his cardiac history.    The patient  reports that he does not use drugs. His body mass index is 33.75 kg/m.  Further details on both, my assessment(s), as well as the proposed treatment plan, please see below.  Controlled Substance Pharmacotherapy Assessment REMS (Risk Evaluation and Mitigation Strategy)  Analgesic:Oxycodone IR 5 mg every 6  hours (5 mg/dayof oxycodone)   MME:22.5 mEq per day [name], RN  02/13/2017  9:18 AM  Signed Nursing Pain Medication Assessment:  Safety precautions to be maintained throughout the outpatient stay will include: orient to surroundings, keep bed in low position, maintain call bell within reach at all times, provide assistance with transfer out of bed and ambulation.  Medication Inspection Compliance: Pill count conducted under aseptic conditions, in front of the  patient. Neither the pills nor the bottle was removed from the patient's sight at any time. Once  count was completed pills were immediately returned to the patient in their original bottle.  Medication: Oxycodone IR Pill/Patch Count: 22 of 120 pills remain Pill/Patch Appearance: Markings consistent with prescribed medication Bottle Appearance: Standard pharmacy container. Clearly labeled. Filled Date: 08 / 24/ 2018 Last Medication intake:  Today   Pharmacokinetics: Liberation and absorption (onset of action): WNL Distribution (time to peak effect): WNL Metabolism and excretion (duration of action): WNL         Pharmacodynamics: Desired effects: Analgesia: Aaron Short reports >50% benefit. Functional ability: Patient reports that medication allows him to accomplish basic ADLs Clinically meaningful improvement in function (CMIF): Sustained CMIF goals met Perceived effectiveness: Described as relatively effective, allowing for increase in activities of daily living (ADL) Undesirable effects: Side-effects or Adverse reactions: None reported Monitoring: [place] PMP: Online review of the past 24-monthperiod conducted. Compliant with practice rules and regulations List of all UDS test(s) done:  Lab Results  Component Value Date   TOXASSSELUR FINAL 02/07/2016   TOXASSSELUR FINAL 11/17/2015   TDenmarkFINAL 08/16/2015   SUMMARY FINAL 12/21/2016   Last UDS on record: ToxAssure Select 13  Date Value Ref Range Status  02/07/2016 FINAL  Final    Comment:    ==================================================================== TOXASSURE SELECT 13 (MW) ==================================================================== Test                             Result       Flag       Units Drug Present and Declared for Prescription Verification   Morphine                       >7143        EXPECTED   ng/mg creat    Potential sources of large amounts of morphine in the absence of    codeine  include administration of morphine or use of heroin.   Hydromorphone                  36           EXPECTED   ng/mg creat    Hydromorphone may be present as a metabolite of morphine;    concentrations of hydromorphone rarely exceed 5% of the morphine    concentration when this is the source of hydromorphone.   Oxycodone                      659          EXPECTED   ng/mg creat   Oxymorphone                    1874         EXPECTED   ng/mg creat   Noroxycodone                   1891         EXPECTED   ng/mg creat   Noroxymorphone                 401          EXPECTED   ng/mg creat    Sources of oxycodone are scheduled prescription medications.    Oxymorphone, noroxycodone, and noroxymorphone are expected    metabolites of oxycodone. Oxymorphone is also available as a    scheduled prescription medication. ==================================================================== Test  Result    Flag   Units      Ref Range   Creatinine              140              mg/dL      >=20 ==================================================================== Declared Medications:  The flagging and interpretation on this report are based on the  following declared medications.  Unexpected results may arise from  inaccuracies in the declared medications.  **Note: The testing scope of this panel includes these medications:  Morphine (MS Contin)  Oxycodone (Oxycodone Acetaminophen)  **Note: The testing scope of this panel does not include following  reported medications:  Acetaminophen (Oxycodone Acetaminophen)  Carisoprodol (Soma)  Digoxin (Lanoxin)  Furosemide (Lasix)  Losartan (Cozaar)  Metformin (Glucophage)  Metoprolol (Toprol)  Naloxone  Simvastatin (Zocor)  Warfarin (Coumadin) ==================================================================== For clinical consultation, please call [phone]. ====================================================================     Summary  Date Value Ref Range Status  12/21/2016 FINAL  Final    Comment:    ==================================================================== TOXASSURE SELECT 13 (MW) ==================================================================== Test                             Result       Flag       Units Drug Present and Declared for Prescription Verification   Oxycodone                      575          EXPECTED   ng/mg creat   Oxymorphone                    2852         EXPECTED   ng/mg creat   Noroxycodone                   2074         EXPECTED   ng/mg creat   Noroxymorphone                 1090         EXPECTED   ng/mg creat    Sources of oxycodone are scheduled prescription medications.    Oxymorphone, noroxycodone, and noroxymorphone are expected    metabolites of oxycodone. Oxymorphone is also available as a    scheduled prescription medication. ==================================================================== Test                      Result    Flag   Units      Ref Range   Creatinine              145              mg/dL      >=20 ==================================================================== Declared Medications:  The flagging and interpretation on this report are based on the  following declared medications.  Unexpected results may arise from  inaccuracies in the declared medications.  **Note: The testing scope of this panel includes these medications:  Oxycodone  **Note: The testing scope of this panel does not include following  reported medications:  Carisoprodol (Soma)  Digoxin  Furosemide  Losartan (Losartan Potassium)  Melatonin  Metformin  Metoprolol  Naloxone  Rosuvastatin  Simvastatin  Warfarin ==================================================================== For clinical consultation, please call [phone]. ====================================================================    UDS interpretation: Compliant          Medication  Assessment Form: Reviewed. Patient indicates being compliant with therapy Treatment compliance: Compliant Risk Assessment Profile: Aberrant behavior: See prior evaluations. None observed or detected today Comorbid factors increasing risk of overdose: See prior notes. No additional risks detected today Risk of substance use disorder (SUD): Low     Opioid Risk Tool - 12/21/16 1028      Family History of Substance Abuse   Alcohol Negative   Illegal Drugs Negative   Rx Drugs Negative     Personal History of Substance Abuse   Alcohol Negative   Illegal Drugs Negative   Rx Drugs Negative     Psychological Disease   Psychological Disease Negative   Depression Negative     Total Score   Opioid Risk Tool Scoring 0   Opioid Risk Interpretation Low Risk     ORT Scoring interpretation table:  Score <3 = Low Risk for SUD  Score between 4-7 = Moderate Risk for SUD  Score >8 = High Risk for Opioid Abuse   Risk Mitigation Strategies:  Patient Counseling: Covered Patient-Prescriber Agreement (PPA): Present and active  Notification to other healthcare providers: Done  Pharmacologic Plan: No change in therapy, at this time  Laboratory Chemistry  Inflammation Markers (CRP: Acute Phase) (ESR: Chronic Phase) Lab Results  Component Value Date   CRP <0.5 11/18/2015   ESRSEDRATE 6 11/18/2015                 Renal Function Markers Lab Results  Component Value Date   BUN 15 11/18/2015   CREATININE 0.80 11/18/2015   GFRAA >60 11/18/2015   GFRNONAA >60 11/18/2015                 Hepatic Function Markers Lab Results  Component Value Date   AST 19 11/18/2015   ALT 17 11/18/2015   ALBUMIN 4.1 11/18/2015   ALKPHOS 68 11/18/2015                 Electrolytes Lab Results  Component Value Date   NA 139 11/18/2015   K 4.3 11/18/2015   CL 103 11/18/2015   CALCIUM 9.3 11/18/2015   MG 1.8 11/18/2015                 Neuropathy Markers Lab Results  Component Value Date    VITAMINB12 268 11/18/2015                 Bone Pathology Markers Lab Results  Component Value Date   ALKPHOS 68 11/18/2015   25OHVITD1 31 11/18/2015   25OHVITD2 <1.0 11/18/2015   25OHVITD3 30 11/18/2015   CALCIUM 9.3 11/18/2015                 Coagulation Parameters No results found for: INR, LABPROT, APTT, PLT               Cardiovascular Markers No results found for: BNP, HGB, HCT               Note: Lab results reviewed.  Recent Diagnostic Imaging Review  Mr Shoulder Left Wo Contrast  Result Date: 10/10/2016 CLINICAL DATA:  Shoulder pain and limited range of motion. Remote history of shoulder surgery. EXAM: MRI OF THE LEFT SHOULDER WITHOUT CONTRAST TECHNIQUE: Multiplanar, multisequence MR imaging of the shoulder was performed. No intravenous contrast was administered. COMPARISON:  Radiographs 11/18/2015 FINDINGS: Rotator cuff: Significant supraspinatus and infraspinatus tendinopathy/tendinosis with articular surface and interstitial tears extending back toward the musculotendinous junction region. No full-thickness retracted tear but  probably at risk for such. The subscapularis tendon is intact. Muscles: Mild fatty atrophy of the shoulder musculature, in particular the supraspinatus and infraspinatus muscles. There are intramuscular ganglion cysts in the infraspinatus muscle likely associated with articular surface tears. Biceps long head: Not visualized on any of the imaging sequences and likely chronically torn and retracted. Acromioclavicular Joint: Moderate degenerative changes. Type 1-2 acromion. Mild lateral downsloping and undersurface spurring. Glenohumeral Joint: Moderate degenerative changes with joint space narrowing an osteophytic spurring. Moderate degenerative chondrosis and small joint effusion. There is thickening of the capsular structures in the axillary recess which can be seen with adhesive capsulitis or synovitis. Labrum: The superior labrum is degenerated but no  obvious tear. The anterior posterior labrum are grossly intact. Bones: No acute bony findings. Subchondral cystic change noted in the humeral head. Other: Moderate subacromial/ subdeltoid bursitis. IMPRESSION: 1. Significant rotator cuff tendinopathy/tendinosis with interstitial and articular surface tears involving the supraspinatus and infraspinatus tendons. No full thickness retracted tear. 2. Likely chronically torn and retracted long head biceps tendon. 3. Moderate AC joint degenerative changes with mild lateral downsloping and undersurface spurring may contribute to bony impingement. 4. Moderate glenohumeral joint degenerative changes with synovitis or adhesive capsulitis. 5. Moderate subacromial/subdeltoid bursitis Electronically Signed   By: Marijo Sanes M.D.   On: 10/10/2016 14:16   Note: Imaging results reviewed.          Meds   Current Outpatient Prescriptions:  .  carisoprodol (SOMA) 350 MG tablet, Take 350 mg by mouth 2 (two) times daily. , Disp: , Rfl:  .  digoxin (DIGOX) 0.125 MG tablet, TAKE 1 TABLET BY MOUTH EVERY DAY, Disp: , Rfl:  .  furosemide (LASIX) 20 MG tablet, Take 20 mg by mouth as needed. , Disp: , Rfl:  .  losartan (COZAAR) 50 MG tablet, Take 50 mg by mouth daily. , Disp: , Rfl:  .  Melatonin 5 MG TABS, Take 5 mg by mouth daily as needed. , Disp: , Rfl:  .  metFORMIN (GLUCOPHAGE) 500 MG tablet, Take 500 mg by mouth daily with breakfast. , Disp: , Rfl:  .  metoprolol succinate (TOPROL-XL) 25 MG 24 hr tablet, Take 25 mg by mouth., Disp: , Rfl:  .  Naloxone HCl (NARCAN) 4 MG/0.1ML LIQD, Place 1 spray into the nose once. Spray half of bottle content into each nostril, then call 911, Disp: 2 each, Rfl: 0 .  [START ON 02/18/2017] oxyCODONE (OXY IR/ROXICODONE) 5 MG immediate release tablet, Take 1 tablet (5 mg total) by mouth every 6 (six) hours as needed for severe pain., Disp: 120 tablet, Rfl: 0 .  rosuvastatin (CRESTOR) 20 MG tablet, Take 20 mg by mouth daily. , Disp: , Rfl:   .  warfarin (COUMADIN) 2 MG tablet, Take 2 mg by mouth daily. Monday,  friday, Disp: , Rfl:  .  warfarin (COUMADIN) 4 MG tablet, Take 4 mg by mouth daily. Tuesday, wednesday Thursday, Saturday , sunday, Disp: , Rfl:  .  carisoprodol (SOMA) 350 MG tablet, Take 350 mg by mouth., Disp: , Rfl:  .  furosemide (LASIX) 20 MG tablet, TAKE 1 TABLET(20 MG) BY MOUTH DAILY AS NEEDED FOR SWELLING, Disp: , Rfl:  .  metFORMIN (GLUCOPHAGE) 500 MG tablet, TAKE 1 TABLET(500 MG) BY MOUTH DAILY WITH BREAKFAST, Disp: , Rfl:  .  metoprolol succinate (TOPROL-XL) 25 MG 24 hr tablet, Take 25 mg by mouth., Disp: , Rfl:  .  [START ON 03/20/2017] oxyCODONE (OXY IR/ROXICODONE) 5 MG immediate release tablet,  Take 1 tablet (5 mg total) by mouth every 6 (six) hours as needed for severe pain., Disp: 120 tablet, Rfl: 0 .  [START ON 04/19/2017] oxyCODONE (OXY IR/ROXICODONE) 5 MG immediate release tablet, Take 1 tablet (5 mg total) by mouth every 6 (six) hours as needed for severe pain., Disp: 120 tablet, Rfl: 0 .  simvastatin (ZOCOR) 40 MG tablet, Take 40 mg by mouth at bedtime. , Disp: , Rfl:   ROS  Constitutional: Denies any fever or chills Gastrointestinal: No reported hemesis, hematochezia, vomiting, or acute GI distress Musculoskeletal: Denies any acute onset joint swelling, redness, loss of ROM, or weakness Neurological: No reported episodes of acute onset apraxia, aphasia, dysarthria, agnosia, amnesia, paralysis, loss of coordination, or loss of consciousness  Allergies  Aaron Short is allergic to diltiazem; penicillins; and tizanidine.  PFSH  Drug: Aaron Short  reports that he does not use drugs. Alcohol:  reports that he does not drink alcohol. Tobacco:  reports that he has been smoking Cigarettes.  He has never used smokeless tobacco. Medical:  has a past medical history of Arthralgia of lower leg (04/02/2012); Arthralgia of upper arm (06/18/2009); Cancer ([place]); Diabetes mellitus without complication ([place]);  FH: atrial fibrillation; and Hypertension. Surgical: Aaron Short  has a past surgical history that includes bladder cancer surgery (2015); Joint replacement; Replacement total hip w/  resurfacing implants (Bilateral); Total knee arthroplasty (Bilateral); and Shoulder surgery (Bilateral). Family: family history includes Dementia in his mother; Heart disease in his father.  Constitutional Exam  General appearance: Well nourished, well developed, and well hydrated. In no apparent acute distress Vitals:   02/13/17 0913  BP: 121/77  Pulse: (!) 55  Resp: 16  Temp: 98 F (36.7 C)  SpO2: 100%  Weight: 222 lb (100.7 kg)  Height: '5\' 8"'  (1.727 m)   BMI Assessment: Estimated body mass index is 33.75 kg/m as calculated from the following:   Height as of this encounter: '5\' 8"'  (1.727 m).   Weight as of this encounter: 222 lb (100.7 kg). Psych/Mental status: Alert, oriented x 3 (person, place, & time)       Eyes: PERLA Respiratory: No evidence of acute respiratory distress  Cervical Spine Area Exam  Skin & Axial Inspection: No masses, redness, edema, swelling, or associated skin lesions Alignment: Symmetrical Functional ROM: Unrestricted ROM      Stability: No instability detected Muscle Tone/Strength: Functionally intact. No obvious neuro-muscular anomalies detected. Sensory (Neurological): Unimpaired Palpation: No palpable anomalies              Upper Extremity (UE) Exam    Side: Right upper extremity  Side: Left upper extremity  Skin & Extremity Inspection: Skin color, temperature, and hair growth are WNL. No peripheral edema or cyanosis. No masses, redness, swelling, asymmetry, or associated skin lesions. No contractures.  Skin & Extremity Inspection: Skin color, temperature, and hair growth are WNL. No peripheral edema or cyanosis. No masses, redness, swelling, asymmetry, or associated skin lesions. No contractures.  Functional ROM: Decreased ROM          Functional ROM: Decreased ROM           Muscle Tone/Strength: Functionally intact. No obvious neuro-muscular anomalies detected.  Muscle Tone/Strength: Functionally intact. No obvious neuro-muscular anomalies detected.  Sensory (Neurological): Unimpaired          Sensory (Neurological): Unimpaired          Palpation: No palpable anomalies              Palpation:  No palpable anomalies              Specialized Test(s): Deferred         Specialized Test(s): Deferred          Thoracic Spine Area Exam  Skin & Axial Inspection: No masses, redness, or swelling Alignment: Symmetrical Functional ROM: Unrestricted ROM Stability: No instability detected Muscle Tone/Strength: Functionally intact. No obvious neuro-muscular anomalies detected. Sensory (Neurological): Unimpaired Muscle strength & Tone: No palpable anomalies  Assessment  Primary Diagnosis & Pertinent Problem List: The primary encounter diagnosis was Chronic acromioclavicular joint pain (Left). Diagnoses of Chronic shoulder pain (Location of Primary Source of Pain) (Bilateral) (L>R), Osteoarthritis of shoulder (Bilateral) (L>R), and Chronic pain syndrome were also pertinent to this visit.  Status Diagnosis  Stable Stable Stable 1. Chronic acromioclavicular joint pain (Left)   2. Chronic shoulder pain (Location of Primary Source of Pain) (Bilateral) (L>R)   3. Osteoarthritis of shoulder (Bilateral) (L>R)   4. Chronic pain syndrome     Problems updated and reviewed during this visit: No problems updated. Plan of Care  Pharmacotherapy (Medications Ordered): Meds ordered this encounter  Medications  . oxyCODONE (OXY IR/ROXICODONE) 5 MG immediate release tablet    Sig: Take 1 tablet (5 mg total) by mouth every 6 (six) hours as needed for severe pain.    Dispense:  120 tablet    Refill:  0    Do not place this medication, or any other prescription from our practice, on "Automatic Refill". Patient may have prescription filled one day early if pharmacy is closed on  scheduled refill date. Do not fill until:02/18/2017 To last until: 03/20/2017    Order Specific Question:   Supervising Provider    Answer:   Milinda Pointer [phone]  . oxyCODONE (OXY IR/ROXICODONE) 5 MG immediate release tablet    Sig: Take 1 tablet (5 mg total) by mouth every 6 (six) hours as needed for severe pain.    Dispense:  120 tablet    Refill:  0    Do not place this medication, or any other prescription from our practice, on "Automatic Refill". Patient may have prescription filled one day early if pharmacy is closed on scheduled refill date. Do not fill until: 03/20/2017 To last until:04/19/2017    Order Specific Question:   Supervising Provider    Answer:   Milinda Pointer [phone]  . oxyCODONE (OXY IR/ROXICODONE) 5 MG immediate release tablet    Sig: Take 1 tablet (5 mg total) by mouth every 6 (six) hours as needed for severe pain.    Dispense:  120 tablet    Refill:  0    Do not place this medication, or any other prescription from our practice, on "Automatic Refill". Patient may have prescription filled one day early if pharmacy is closed on scheduled refill date. Do not fill until: 04/19/2017 To last until:05/19/2017    Order Specific Question:   Supervising Provider    Answer:   Milinda Pointer [phone]   New Prescriptions   OXYCODONE (OXY IR/ROXICODONE) 5 MG IMMEDIATE RELEASE TABLET    Take 1 tablet (5 mg total) by mouth every 6 (six) hours as needed for severe pain.   OXYCODONE (OXY IR/ROXICODONE) 5 MG IMMEDIATE RELEASE TABLET    Take 1 tablet (5 mg total) by mouth every 6 (six) hours as needed for severe pain.   Medications administered today: Aaron Short had no medications administered during this visit. Lab-work, procedure(s), and/or referral(s): No orders of the defined  types were placed in this encounter.  Imaging and/or referral(s): None  Interventional therapies: Planned, scheduled, and/or pending:  None at this time    Considering:   (Stop Coumadin for 5 days prior to procedure) Left sided suprascapular nerve radiofrequency ablation    Palliative PRN treatment(s):  (Stop Coumadin for 5 days prior to procedure) Suprascapular nerve block  Possible suprascapular nerve radiofrequency ablation    Provider-requested follow-up: Return in about 3 months (around 05/15/2017) for MedMgmt.  Future Appointments Date Time Provider [place]  05/15/2017 9:30 AM Vevelyn Francois, NP [facility] None   Primary Care Physician: Neomia Dear, MD Location: [facility] Outpatient Pain Management Facility Note by: Vevelyn Francois NP Date: 02/13/2017; Time: 3:54 PM  Pain Score Disclaimer: We use the NRS-11 scale. This is a self-reported, subjective measurement of pain severity with only modest accuracy. It is used primarily to identify changes within a particular patient. It must be understood that outpatient pain scales are significantly less accurate that those used for research, where they can be applied under ideal controlled circumstances with minimal exposure to variables. In reality, the score is likely to be a combination of pain intensity and pain affect, where pain affect describes the degree of emotional arousal or changes in action readiness caused by the sensory experience of pain. Factors such as social and work situation, setting, emotional state, anxiety levels, expectation, and prior pain experience may influence pain perception and show large inter-individual differences that may also be affected by time variables.  Patient instructions provided during this appointment: Patient Instructions    ____________________________________________________________________________________________  Medication Rules  Applies to: All patients receiving prescriptions (written or electronic).  Pharmacy of record: Pharmacy where electronic prescriptions will be sent. If written prescriptions are taken to a different pharmacy,  please inform the nursing staff. The pharmacy listed in the electronic medical record should be the one where you would like electronic prescriptions to be sent.  Prescription refills: Only during scheduled appointments. Applies to both, written and electronic prescriptions.  NOTE: The following applies primarily to controlled substances (Opioid* Pain Medications).   Patient's responsibilities: 1. Pain Pills: Bring all pain pills to every appointment (except for procedure appointments). 2. Pill Bottles: Bring pills in original pharmacy bottle. Always bring newest bottle. Bring bottle, even if empty. 3. Medication refills: You are responsible for knowing and keeping track of what medications you need refilled. The day before your appointment, write a list of all prescriptions that need to be refilled. Bring that list to your appointment and give it to the admitting nurse. Prescriptions will be written only during appointments. If you forget a medication, it will not be "Called in", "Faxed", or "electronically sent". You will need to get another appointment to get these prescribed. 4. Prescription Accuracy: You are responsible for carefully inspecting your prescriptions before leaving our office. Have the discharge nurse carefully go over each prescription with you, before taking them home. Make sure that your name is accurately spelled, that your address is correct. Check the name and dose of your medication to make sure it is accurate. Check the number of pills, and the written instructions to make sure they are clear and accurate. Make sure that you are given enough medication to last until your next medication refill appointment. 5. Taking Medication: Take medication as prescribed. Never take more pills than instructed. Never take medication more frequently than prescribed. Taking less pills or less frequently is permitted and encouraged, when it comes to controlled substances (written  prescriptions).   6. Inform other Doctors: Always inform, all of your healthcare providers, of all the medications you take. 7. Pain Medication from other Providers: You are not allowed to accept any additional pain medication from any other Doctor or Healthcare provider. There are two exceptions to this rule. (see below) In the event that you require additional pain medication, you are responsible for notifying us, as stated below. 8. Medication Agreement: You are responsible for carefully reading and following our Medication Agreement. This must be signed before receiving any prescriptions from our practice. Safely store a copy of your signed Agreement. Violations to the Agreement will result in no further prescriptions. (Additional copies of our Medication Agreement are available upon request.) 9. Laws, Rules, & Regulations: All patients are expected to follow all Federal and [employer], [employer], Rules, [employer]. Ignorance of the Laws does not constitute a valid excuse. The use of any illegal substances is prohibited. 10. Adopted CDC guidelines & recommendations: Target dosing levels will be at or below 60 MME/day. Use of benzodiazepines** is not recommended.  Exceptions: There are only two exceptions to the rule of not receiving pain medications from other Healthcare Providers. 1. Exception #1 (Emergencies): In the event of an emergency (i.e.: accident requiring emergency care), you are allowed to receive additional pain medication. However, you are responsible for: As soon as you are able, call our office [phone], at any time of the day or night, and leave a message stating your name, the date and nature of the emergency, and the name and dose of the medication prescribed. In the event that your call is answered by a member of our staff, make sure to document and save the date, time, and the name of the person that took your information.  2. Exception #2 (Planned Surgery): In the event that you are  scheduled by another doctor or dentist to have any type of surgery or procedure, you are allowed (for a period no longer than 30 days), to receive additional pain medication, for the acute post-op pain. However, in this case, you are responsible for picking up a copy of our "Post-op Pain Management for Surgeons" handout, and giving it to your surgeon or dentist. This document is available at our office, and does not require an appointment to obtain it. Simply go to our office during business hours (Monday-Thursday from 8:00 AM to 4:00 PM) (Friday 8:00 AM to 12:00 Noon) or if you have a scheduled appointment with [place], prior to your surgery, and ask for it by name. In addition, you will need to provide [place] with your name, name of your surgeon, type of surgery, and date of procedure or surgery.  *Opioid medications include: morphine, codeine, oxycodone, oxymorphone, hydrocodone, hydromorphone, meperidine, tramadol, tapentadol, buprenorphine, fentanyl, methadone. **Benzodiazepine medications include: diazepam (Valium), alprazolam (Xanax), clonazepam (Klonopine), lorazepam (Ativan), clorazepate (Tranxene), chlordiazepoxide (Librium), estazolam (Prosom), oxazepam (Serax), temazepam (Restoril), triazolam (Halcion)  ____________________________________________________________________________________________ BMI Assessment: Estimated body mass index is 33.75 kg/m as calculated from the following:   Height as of this encounter: '5\' 8"'  (1.727 m).   Weight as of this encounter: 222 lb (100.7 kg).  BMI interpretation table: BMI level Category Range association with higher incidence of chronic pain  <18 kg/m2 Underweight   18.5-24.9 kg/m2 Ideal body weight   25-29.9 kg/m2 Overweight Increased incidence by 20%  30-34.9 kg/m2 Obese (Class I) Increased incidence by 68%  35-39.9 kg/m2 Severe obesity (Class II) Increased incidence by 136%  >40 kg/m2 Extreme obesity (Class  III) Increased incidence by 254%   BMI  Readings from Last 4 Encounters:  02/13/17 33.75 kg/m  12/21/16 34.67 kg/m  11/07/16 34.97 kg/m  09/05/16 34.97 kg/m   Wt Readings from Last 4 Encounters:  02/13/17 222 lb (100.7 kg)  12/21/16 228 lb (103.4 kg)  11/07/16 230 lb (104.3 kg)  09/05/16 230 lb (104.3 kg)

## 2017-02-13 NOTE — Progress Notes (Signed)
Nursing Pain Medication Assessment:  Safety precautions to be maintained throughout the outpatient stay will include: orient to surroundings, keep bed in low position, maintain call bell within reach at all times, provide assistance with transfer out of bed and ambulation.  Medication Inspection Compliance: Pill count conducted under aseptic conditions, in front of the patient. Neither the pills nor the bottle was removed from the patient's sight at any time. Once count was completed pills were immediately returned to the patient in their original bottle.  Medication: Oxycodone IR Pill/Patch Count: 22 of 120 pills remain Pill/Patch Appearance: Markings consistent with prescribed medication Bottle Appearance: Standard pharmacy container. Clearly labeled. Filled Date: 08 / 24/ 2018 Last Medication intake:  Today

## 2017-02-26 DIAGNOSIS — Z23 Encounter for immunization: Secondary | ICD-10-CM | POA: Diagnosis not present

## 2017-02-26 DIAGNOSIS — R7301 Impaired fasting glucose: Secondary | ICD-10-CM | POA: Diagnosis not present

## 2017-02-26 DIAGNOSIS — I10 Essential (primary) hypertension: Secondary | ICD-10-CM | POA: Diagnosis not present

## 2017-02-26 DIAGNOSIS — E7800 Pure hypercholesterolemia, unspecified: Secondary | ICD-10-CM | POA: Diagnosis not present

## 2017-02-26 DIAGNOSIS — I4891 Unspecified atrial fibrillation: Secondary | ICD-10-CM | POA: Diagnosis not present

## 2017-02-26 DIAGNOSIS — E119 Type 2 diabetes mellitus without complications: Secondary | ICD-10-CM | POA: Diagnosis not present

## 2017-02-28 DIAGNOSIS — Z7901 Long term (current) use of anticoagulants: Secondary | ICD-10-CM | POA: Diagnosis not present

## 2017-02-28 DIAGNOSIS — I4891 Unspecified atrial fibrillation: Secondary | ICD-10-CM | POA: Diagnosis not present

## 2017-03-26 DIAGNOSIS — I4892 Unspecified atrial flutter: Secondary | ICD-10-CM | POA: Diagnosis not present

## 2017-03-26 DIAGNOSIS — I10 Essential (primary) hypertension: Secondary | ICD-10-CM | POA: Diagnosis not present

## 2017-03-26 DIAGNOSIS — I4891 Unspecified atrial fibrillation: Secondary | ICD-10-CM | POA: Diagnosis not present

## 2017-03-30 DIAGNOSIS — M62838 Other muscle spasm: Secondary | ICD-10-CM | POA: Diagnosis not present

## 2017-03-30 DIAGNOSIS — S8012XA Contusion of left lower leg, initial encounter: Secondary | ICD-10-CM | POA: Insufficient documentation

## 2017-03-30 DIAGNOSIS — I10 Essential (primary) hypertension: Secondary | ICD-10-CM | POA: Diagnosis not present

## 2017-03-30 DIAGNOSIS — M6240 Contracture of muscle, unspecified site: Secondary | ICD-10-CM | POA: Diagnosis not present

## 2017-04-17 DIAGNOSIS — H2513 Age-related nuclear cataract, bilateral: Secondary | ICD-10-CM | POA: Diagnosis not present

## 2017-04-17 DIAGNOSIS — E119 Type 2 diabetes mellitus without complications: Secondary | ICD-10-CM | POA: Diagnosis not present

## 2017-04-17 DIAGNOSIS — H524 Presbyopia: Secondary | ICD-10-CM | POA: Diagnosis not present

## 2017-04-17 DIAGNOSIS — E089 Diabetes mellitus due to underlying condition without complications: Secondary | ICD-10-CM | POA: Diagnosis not present

## 2017-05-15 ENCOUNTER — Ambulatory Visit: Payer: Medicare Other | Attending: Nurse Practitioner | Admitting: Nurse Practitioner

## 2017-05-15 ENCOUNTER — Other Ambulatory Visit: Payer: Self-pay

## 2017-05-15 ENCOUNTER — Encounter: Payer: Self-pay | Admitting: Nurse Practitioner

## 2017-05-15 VITALS — BP 138/70 | HR 58 | Temp 98.7°F | Resp 20 | Ht 68.0 in | Wt 228.0 lb

## 2017-05-15 DIAGNOSIS — Z96653 Presence of artificial knee joint, bilateral: Secondary | ICD-10-CM | POA: Insufficient documentation

## 2017-05-15 DIAGNOSIS — M545 Low back pain: Secondary | ICD-10-CM | POA: Diagnosis not present

## 2017-05-15 DIAGNOSIS — E7801 Familial hypercholesterolemia: Secondary | ICD-10-CM | POA: Insufficient documentation

## 2017-05-15 DIAGNOSIS — Z79899 Other long term (current) drug therapy: Secondary | ICD-10-CM | POA: Insufficient documentation

## 2017-05-15 DIAGNOSIS — M542 Cervicalgia: Secondary | ICD-10-CM | POA: Insufficient documentation

## 2017-05-15 DIAGNOSIS — Z5181 Encounter for therapeutic drug level monitoring: Secondary | ICD-10-CM | POA: Insufficient documentation

## 2017-05-15 DIAGNOSIS — F17210 Nicotine dependence, cigarettes, uncomplicated: Secondary | ICD-10-CM | POA: Diagnosis not present

## 2017-05-15 DIAGNOSIS — Z7901 Long term (current) use of anticoagulants: Secondary | ICD-10-CM | POA: Diagnosis not present

## 2017-05-15 DIAGNOSIS — Z880 Allergy status to penicillin: Secondary | ICD-10-CM | POA: Insufficient documentation

## 2017-05-15 DIAGNOSIS — I10 Essential (primary) hypertension: Secondary | ICD-10-CM | POA: Insufficient documentation

## 2017-05-15 DIAGNOSIS — Z96643 Presence of artificial hip joint, bilateral: Secondary | ICD-10-CM | POA: Diagnosis not present

## 2017-05-15 DIAGNOSIS — Z6834 Body mass index (BMI) 34.0-34.9, adult: Secondary | ICD-10-CM | POA: Diagnosis not present

## 2017-05-15 DIAGNOSIS — M19012 Primary osteoarthritis, left shoulder: Secondary | ICD-10-CM | POA: Diagnosis not present

## 2017-05-15 DIAGNOSIS — G894 Chronic pain syndrome: Secondary | ICD-10-CM | POA: Insufficient documentation

## 2017-05-15 DIAGNOSIS — G8929 Other chronic pain: Secondary | ICD-10-CM

## 2017-05-15 DIAGNOSIS — Z79891 Long term (current) use of opiate analgesic: Secondary | ICD-10-CM | POA: Insufficient documentation

## 2017-05-15 DIAGNOSIS — E119 Type 2 diabetes mellitus without complications: Secondary | ICD-10-CM | POA: Diagnosis not present

## 2017-05-15 DIAGNOSIS — M25561 Pain in right knee: Secondary | ICD-10-CM

## 2017-05-15 DIAGNOSIS — M25559 Pain in unspecified hip: Secondary | ICD-10-CM | POA: Diagnosis not present

## 2017-05-15 DIAGNOSIS — M25562 Pain in left knee: Secondary | ICD-10-CM

## 2017-05-15 DIAGNOSIS — I4891 Unspecified atrial fibrillation: Secondary | ICD-10-CM | POA: Diagnosis not present

## 2017-05-15 MED ORDER — OXYCODONE HCL 5 MG PO TABS: 5.0000 mg | ORAL_TABLET | Freq: Four times a day (QID) | ORAL | 0 refills | Status: DC | PRN

## 2017-05-15 NOTE — Patient Instructions (Addendum)
____________________________________________________________________________________________  Medication Rules  Applies to: All patients receiving prescriptions (written or electronic).  Pharmacy of record: Pharmacy where electronic prescriptions will be sent. If written prescriptions are taken to a different pharmacy, please inform the nursing staff. The pharmacy listed in the electronic medical record should be the one where you would like electronic prescriptions to be sent.  Prescription refills: Only during scheduled appointments. Applies to both, written and electronic prescriptions.  NOTE: The following applies primarily to controlled substances (Opioid* Pain Medications).   Patient's responsibilities: 1. Pain Pills: Bring all pain pills to every appointment (except for procedure appointments). 2. Pill Bottles: Bring pills in original pharmacy bottle. Always bring newest bottle. Bring bottle, even if empty. 3. Medication refills: You are responsible for knowing and keeping track of what medications you need refilled. The day before your appointment, write a list of all prescriptions that need to be refilled. Bring that list to your appointment and give it to the admitting nurse. Prescriptions will be written only during appointments. If you forget a medication, it will not be "Called in", "Faxed", or "electronically sent". You will need to get another appointment to get these prescribed. 4. Prescription Accuracy: You are responsible for carefully inspecting your prescriptions before leaving our office. Have the discharge nurse carefully go over each prescription with you, before taking them home. Make sure that your name is accurately spelled, that your address is correct. Check the name and dose of your medication to make sure it is accurate. Check the number of pills, and the written instructions to make sure they are clear and accurate. Make sure that you are given enough medication to  last until your next medication refill appointment. 5. Taking Medication: Take medication as prescribed. Never take more pills than instructed. Never take medication more frequently than prescribed. Taking less pills or less frequently is permitted and encouraged, when it comes to controlled substances (written prescriptions).  6. Inform other Doctors: Always inform, all of your healthcare providers, of all the medications you take. 7. Pain Medication from other Providers: You are not allowed to accept any additional pain medication from any other Doctor or Healthcare provider. There are two exceptions to this rule. (see below) In the event that you require additional pain medication, you are responsible for notifying us, as stated below. 8. Medication Agreement: You are responsible for carefully reading and following our Medication Agreement. This must be signed before receiving any prescriptions from our practice. Safely store a copy of your signed Agreement. Violations to the Agreement will result in no further prescriptions. (Additional copies of our Medication Agreement are available upon request.) 9. Laws, Rules, & Regulations: All patients are expected to follow all Federal and State Laws, Statutes, Rules, & Regulations. Ignorance of the Laws does not constitute a valid excuse. The use of any illegal substances is prohibited. 10. Adopted CDC guidelines & recommendations: Target dosing levels will be at or below 60 MME/day. Use of benzodiazepines** is not recommended.  Exceptions: There are only two exceptions to the rule of not receiving pain medications from other Healthcare Providers. 1. Exception #1 (Emergencies): In the event of an emergency (i.e.: accident requiring emergency care), you are allowed to receive additional pain medication. However, you are responsible for: As soon as you are able, call our office [phone], at any time of the day or night, and leave a message stating your  name, the date and nature of the emergency, and the name and dose of the medication   prescribed. In the event that your call is answered by a member of our staff, make sure to document and save the date, time, and the name of the person that took your information.  2. Exception #2 (Planned Surgery): In the event that you are scheduled by another doctor or dentist to have any type of surgery or procedure, you are allowed (for a period no longer than 30 days), to receive additional pain medication, for the acute post-op pain. However, in this case, you are responsible for picking up a copy of our "Post-op Pain Management for Surgeons" handout, and giving it to your surgeon or dentist. This document is available at our office, and does not require an appointment to obtain it. Simply go to our office during business hours (Monday-Thursday from 8:00 AM to 4:00 PM) (Friday 8:00 AM to 12:00 Noon) or if you have a scheduled appointment with [place], prior to your surgery, and ask for it by name. In addition, you will need to provide [place] with your name, name of your surgeon, type of surgery, and date of procedure or surgery.  *Opioid medications include: morphine, codeine, oxycodone, oxymorphone, hydrocodone, hydromorphone, meperidine, tramadol, tapentadol, buprenorphine, fentanyl, methadone. **Benzodiazepine medications include: diazepam (Valium), alprazolam (Xanax), clonazepam (Klonopine), lorazepam (Ativan), clorazepate (Tranxene), chlordiazepoxide (Librium), estazolam (Prosom), oxazepam (Serax), temazepam (Restoril), triazolam (Halcion)  ____________________________________________________________________________________________   BMI interpretation table: BMI level Category Range association with higher incidence of chronic pain  <18 kg/m2 Underweight   18.5-24.9 kg/m2 Ideal body weight   25-29.9 kg/m2 Overweight Increased incidence by 20%  30-34.9 kg/m2 Obese (Class I) Increased incidence by 68%  35-39.9 kg/m2  Severe obesity (Class II) Increased incidence by 136%  >40 kg/m2 Extreme obesity (Class III) Increased incidence by 254%   BMI Readings from Last 4 Encounters:  05/15/17 34.67 kg/m  02/13/17 33.75 kg/m  12/21/16 34.67 kg/m  11/07/16 34.97 kg/m   Wt Readings from Last 4 Encounters:  05/15/17 228 lb (103.4 kg)  02/13/17 222 lb (100.7 kg)  12/21/16 228 lb (103.4 kg)  11/07/16 230 lb (104.3 kg)

## 2017-05-15 NOTE — Progress Notes (Signed)
Nursing Pain Medication Assessment:  Safety precautions to be maintained throughout the outpatient stay will include: orient to surroundings, keep bed in low position, maintain call bell within reach at all times, provide assistance with transfer out of bed and ambulation.  Medication Inspection Compliance: Pill count conducted under aseptic conditions, in front of the patient. Neither the pills nor the bottle was removed from the patient's sight at any time. Once count was completed pills were immediately returned to the patient in their original bottle.  Medication: See above Pill/Patch Count: 12 of 120 pills remain Pill/Patch Appearance: Markings consistent with prescribed medication Bottle Appearance: Standard pharmacy container. Clearly labeled. Filled Date: 11 /21 / 2018 Last Medication intake:  Today

## 2017-05-15 NOTE — Progress Notes (Signed)
Patient's Name: Aaron Short  MRN: [id number]  Referring Provider: Neomia Dear, MD  DOB: 02/23/1946  PCP: Neomia Dear, MD  DOS: 05/15/2017  Note by: Vevelyn Francois NP  Service setting: Ambulatory outpatient  Specialty: Interventional Pain Management  Location: [facility] (AMB) Pain Management Facility    Patient type: Established    Primary Reason(s) for Visit: Encounter for prescription drug management. (Level of risk: moderate)  CC: Shoulder Pain (bilateral)  HPI  Aaron Short is a 71 y.o. year old, male patient, who comes today for a medication management evaluation. He has Long term current use of opiate analgesic; Long term prescription opiate use; Opiate use (97.5 MME/Day); Encounter for therapeutic drug level monitoring; Encounter for chronic pain management; Opioid dependence, daily use ([place]); Chronic hip pain (Location of Secondary source of pain) (Bilateral) (R>L); Chronic shoulder pain (Location of Primary Source of Pain) (Bilateral) (L>R); Chronic low back pain (Location of Tertiary source of pain) (Bilateral) (R>L); Chronic knee pain (Bilateral) (R>L); S/P THR: total hip replacement (Bilateral); S/P TKR: total knee replacement (Bilateral); Atrial fibrillation ([place]); Malignant neoplasm of urinary bladder ([place]); Type 2 diabetes mellitus ([place]); Muscle spasticity; Coumadin anticoagulation; Combined fat and carbohydrate induced hyperlipemia; Pure hypercholesterolemia; History of bladder cancer; History of hip fracture; History of pelvic fracture; History of anticoagulant therapy; Chronic neck pain; Chondrocalcinosis; Chronic shoulder pain (Rad[place]); History of shoulder surgery; Myofascial pain; Musculoskeletal pain; Muscle spasm; Lumbar facet syndrome (Bilateral) (R>L); Lumbar spondylosis; Morbid obesity ([place]); Continuous opioid dependence ([place]); Disturbance of skin sensation; Malignant neoplasm of bladder ([place]); Smoker; FH: atrial fibrillation; Malignant neoplasm of overlapping  sites of bladder ([place]); Obesity, Class I, BMI 30-34.9; Chronic pain syndrome; Hypertension, benign; Impaired fasting glucose; Chronic acromioclavicular joint pain (Left); Osteoarthritis of shoulder (Bilateral) (L>R); Acromioclavicular arthrosis (Bilateral) (L>R); Arthralgia of acromioclavicular joint (Bilateral) (L>R); and Essential hypertension on their problem list. His primarily concern today is the Shoulder Pain (bilateral)  Pain Assessment: Location: (bilateral) Shoulder Radiating: right arm on occasion Onset: More than a month ago Duration: Chronic pain Quality: Sharp, Dull, Radiating Severity: 1 /10 (self-reported pain score)  Note: Reported level is compatible with observation.                          Effect on ADL: controlled where there is no effect Timing: Intermittent Modifying factors: exercise cold packs warm packs lidoderm patch  Aaron Short was last scheduled for an appointment on 71 for medication management. During today's appointment we reviewed Aaron Short chronic pain status, as well as his outpatient medication regimen. He admits that his pain stable.   The patient  reports that he does not use drugs. His body mass index is 34.67 kg/m.  Further details on both, my assessment(s), as well as the proposed treatment plan, please see below.  Controlled Substance Pharmacotherapy Assessment REMS (Risk Evaluation and Mitigation Strategy)  Analgesic:Oxycodone IR 5 mg every 6  hours (5 mg/dayof oxycodone)  MME:22.5 mEq per day  Morley Kos, RN  05/15/2017  9:03 AM  Sign at close encounter Nursing Pain Medication Assessment:  Safety precautions to be maintained throughout the outpatient stay will include: orient to surroundings, keep bed in low position, maintain call bell within reach at all times, provide assistance with transfer out of bed and ambulation.  Medication Inspection Compliance: Pill count conducted under aseptic conditions, in front of  the patient. Neither the pills nor the bottle was removed from the patient's sight at any time. Once count was  completed pills were immediately returned to the patient in their original bottle.  Medication: See above Pill/Patch Count: 12 of 120 pills remain Pill/Patch Appearance: Markings consistent with prescribed medication Bottle Appearance: Standard pharmacy container. Clearly labeled. Filled Date: 11 /21 / 2018 Last Medication intake:  Today   Pharmacokinetics: Liberation and absorption (onset of action): WNL Distribution (time to peak effect): WNL Metabolism and excretion (duration of action): WNL         Pharmacodynamics: Desired effects: Analgesia: Aaron Short reports >50% benefit. Functional ability: Patient reports that medication allows him to accomplish basic ADLs Clinically meaningful improvement in function (CMIF): Sustained CMIF goals met Perceived effectiveness: Described as relatively effective, allowing for increase in activities of daily living (ADL) Undesirable effects: Side-effects or Adverse reactions: None reported Monitoring: [place] PMP: Online review of the past 54-monthperiod conducted. Compliant with practice rules and regulations Last UDS on record: Summary  Date Value Ref Range Status  12/21/2016 FINAL  Final    Comment:    ==================================================================== TOXASSURE SELECT 13 (MW) ==================================================================== Test                             Result       Flag       Units Drug Present and Declared for Prescription Verification   Oxycodone                      575          EXPECTED   ng/mg creat   Oxymorphone                    2852         EXPECTED   ng/mg creat   Noroxycodone                   2074         EXPECTED   ng/mg creat   Noroxymorphone                 1090         EXPECTED   ng/mg creat    Sources of oxycodone are scheduled prescription medications.     Oxymorphone, noroxycodone, and noroxymorphone are expected    metabolites of oxycodone. Oxymorphone is also available as a    scheduled prescription medication. ==================================================================== Test                      Result    Flag   Units      Ref Range   Creatinine              145              mg/dL      >=20 ==================================================================== Declared Medications:  The flagging and interpretation on this report are based on the  following declared medications.  Unexpected results may arise from  inaccuracies in the declared medications.  **Note: The testing scope of this panel includes these medications:  Oxycodone  **Note: The testing scope of this panel does not include following  reported medications:  Carisoprodol (Soma)  Digoxin  Furosemide  Losartan (Losartan Potassium)  Melatonin  Metformin  Metoprolol  Naloxone  Rosuvastatin  Simvastatin  Warfarin ==================================================================== For clinical consultation, please call ([phone] ====================================================================    UDS interpretation: Compliant          Medication Assessment Form: Reviewed. Patient indicates  being compliant with therapy Treatment compliance: Compliant Risk Assessment Profile: Aberrant behavior: See prior evaluations. None observed or detected today Comorbid factors increasing risk of overdose: See prior notes. No additional risks detected today Risk of substance use disorder (SUD): Low Opioid Risk Tool - 05/15/17 0855      Family History of Substance Abuse   Alcohol  Negative    Illegal Drugs  Negative    Rx Drugs  Negative      Personal History of Substance Abuse   Alcohol  Negative    Illegal Drugs  Negative    Rx Drugs  Negative      Age   Age between 60-45 years   No      History of Preadolescent Sexual Abuse   History of  Preadolescent Sexual Abuse  Negative or Male      Psychological Disease   Psychological Disease  Negative    Depression  Negative      Total Score   Opioid Risk Tool Scoring  0    Opioid Risk Interpretation  Low Risk      ORT Scoring interpretation table:  Score <3 = Low Risk for SUD  Score between 4-7 = Moderate Risk for SUD  Score >8 = High Risk for Opioid Abuse   Risk Mitigation Strategies:  Patient Counseling: Covered Patient-Prescriber Agreement (PPA): Present and active  Notification to other healthcare providers: Done  Pharmacologic Plan: No change in therapy, at this time  Laboratory Chemistry  Inflammation Markers (CRP: Acute Phase) (ESR: Chronic Phase) Lab Results  Component Value Date   CRP <0.5 11/18/2015   ESRSEDRATE 6 11/18/2015                 Rheumatology Markers No results found for: Elayne Guerin, [facility]              Renal Function Markers Lab Results  Component Value Date   BUN 15 11/18/2015   CREATININE 0.80 11/18/2015   GFRAA >60 11/18/2015   GFRNONAA >60 11/18/2015                 Hepatic Function Markers Lab Results  Component Value Date   AST 19 11/18/2015   ALT 17 11/18/2015   ALBUMIN 4.1 11/18/2015   ALKPHOS 68 11/18/2015                 Electrolytes Lab Results  Component Value Date   NA 139 11/18/2015   K 4.3 11/18/2015   CL 103 11/18/2015   CALCIUM 9.3 11/18/2015   MG 1.8 11/18/2015                 Neuropathy Markers Lab Results  Component Value Date   VITAMINB12 268 11/18/2015                 Bone Pathology Markers Lab Results  Component Value Date   25OHVITD1 31 11/18/2015   25OHVITD2 <1.0 11/18/2015   25OHVITD3 30 11/18/2015                 Coagulation Parameters No results found for: INR, LABPROT, APTT, PLT, DDIMER               Cardiovascular Markers No results found for: BNP, CKTOTAL, CKMB, TROPONINI, HGB, HCT               CA Markers No results found for: CEA, CA125,  LABCA2  Note: Lab results reviewed.  Recent Diagnostic Imaging Results  MR SHOULDER LEFT WO CONTRAST CLINICAL DATA:  Shoulder pain and limited range of motion. Remote history of shoulder surgery.  EXAM: MRI OF THE LEFT SHOULDER WITHOUT CONTRAST  TECHNIQUE: Multiplanar, multisequence MR imaging of the shoulder was performed. No intravenous contrast was administered.  COMPARISON:  Radiographs 11/18/2015  FINDINGS: Rotator cuff: Significant supraspinatus and infraspinatus tendinopathy/tendinosis with articular surface and interstitial tears extending back toward the musculotendinous junction region. No full-thickness retracted tear but probably at risk for such. The subscapularis tendon is intact.  Muscles: Mild fatty atrophy of the shoulder musculature, in particular the supraspinatus and infraspinatus muscles. There are intramuscular ganglion cysts in the infraspinatus muscle likely associated with articular surface tears.  Biceps long head: Not visualized on any of the imaging sequences and likely chronically torn and retracted.  Acromioclavicular Joint: Moderate degenerative changes. Type 1-2 acromion. Mild lateral downsloping and undersurface spurring.  Glenohumeral Joint: Moderate degenerative changes with joint space narrowing an osteophytic spurring. Moderate degenerative chondrosis and small joint effusion. There is thickening of the capsular structures in the axillary recess which can be seen with adhesive capsulitis or synovitis.  Labrum: The superior labrum is degenerated but no obvious tear. The anterior posterior labrum are grossly intact.  Bones: No acute bony findings. Subchondral cystic change noted in the humeral head.  Other: Moderate subacromial/ subdeltoid bursitis.  IMPRESSION: 1. Significant rotator cuff tendinopathy/tendinosis with interstitial and articular surface tears involving the supraspinatus and infraspinatus tendons. No  full thickness retracted tear. 2. Likely chronically torn and retracted long head biceps tendon. 3. Moderate AC joint degenerative changes with mild lateral downsloping and undersurface spurring may contribute to bony impingement. 4. Moderate glenohumeral joint degenerative changes with synovitis or adhesive capsulitis. 5. Moderate subacromial/subdeltoid bursitis  Electronically Signed   By: Marijo Sanes M.D.   On: 10/10/2016 14:16  Complexity Note: Imaging results reviewed. Results shared with Aaron Short, using Layman's terms.                         Meds   Current Outpatient Medications:  .  carisoprodol (SOMA) 350 MG tablet, Take 350 mg by mouth 2 (two) times daily. , Disp: , Rfl:  .  digoxin (DIGOX) 0.125 MG tablet, TAKE 1 TABLET BY MOUTH EVERY DAY, Disp: , Rfl:  .  furosemide (LASIX) 20 MG tablet, Take 20 mg by mouth as needed. , Disp: , Rfl:  .  Melatonin 5 MG TABS, Take 5 mg by mouth daily as needed. , Disp: , Rfl:  .  metoprolol succinate (TOPROL-XL) 25 MG 24 hr tablet, Take 25 mg by mouth., Disp: , Rfl:  .  Naloxone HCl (NARCAN) 4 MG/0.1ML LIQD, Place 1 spray into the nose once. Spray half of bottle content into each nostril, then call 911, Disp: 2 each, Rfl: 0 .  [START ON 06/18/2017] oxyCODONE (OXY IR/ROXICODONE) 5 MG immediate release tablet, Take 1 tablet (5 mg total) by mouth every 6 (six) hours as needed for severe pain., Disp: 120 tablet, Rfl: 0 .  rivaroxaban (XARELTO) 20 MG TABS tablet, Take 20 mg by mouth., Disp: , Rfl:  .  simvastatin (ZOCOR) 40 MG tablet, Take 40 mg by mouth at bedtime. , Disp: , Rfl:  .  furosemide (LASIX) 20 MG tablet, TAKE 1 TABLET(20 MG) BY MOUTH DAILY AS NEEDED FOR SWELLING, Disp: , Rfl:  .  metoprolol succinate (TOPROL-XL) 25 MG 24 hr tablet, Take 25 mg  by mouth., Disp: , Rfl:  .  [START ON 07/18/2017] oxyCODONE (OXY IR/ROXICODONE) 5 MG immediate release tablet, Take 1 tablet (5 mg total) by mouth every 6 (six) hours as needed for severe  pain., Disp: 120 tablet, Rfl: 0 .  [START ON 05/19/2017] oxyCODONE (OXY IR/ROXICODONE) 5 MG immediate release tablet, Take 1 tablet (5 mg total) by mouth every 6 (six) hours as needed for severe pain., Disp: 120 tablet, Rfl: 0 .  rosuvastatin (CRESTOR) 20 MG tablet, Take 20 mg by mouth daily. , Disp: , Rfl:   ROS  Constitutional: Denies any fever or chills Gastrointestinal: No reported hemesis, hematochezia, vomiting, or acute GI distress Musculoskeletal: Denies any acute onset joint swelling, redness, loss of ROM, or weakness Neurological: No reported episodes of acute onset apraxia, aphasia, dysarthria, agnosia, amnesia, paralysis, loss of coordination, or loss of consciousness  Allergies  Aaron Short is allergic to diltiazem; penicillins; and tizanidine.  PFSH  Drug: Aaron Short  reports that he does not use drugs. Alcohol:  reports that he does not drink alcohol. Tobacco:  reports that he has been smoking cigarettes.  he has never used smokeless tobacco. Medical:  has a past medical history of Arthralgia of lower leg (04/02/2012), Arthralgia of upper arm (06/18/2009), Cancer ([place]), Diabetes mellitus without complication ([place]), FH: atrial fibrillation, and Hypertension. Surgical: Aaron Short  has a past surgical history that includes bladder cancer surgery (2015); Joint replacement; Replacement total hip w/  resurfacing implants (Bilateral); Total knee arthroplasty (Bilateral); and Shoulder surgery (Bilateral). Family: family history includes Dementia in his mother; Heart disease in his father.  Constitutional Exam  General appearance: Well nourished, well developed, and well hydrated. In no apparent acute distress Vitals:   05/15/17 0849  BP: 138/70  Pulse: (!) 58  Resp: 20  Temp: 98.7 F (37.1 C)  TempSrc: Oral  SpO2: 96%  Weight: 228 lb (103.4 kg)  Height: _0  (1.727 m)   BMI Assessment: Estimated body mass index is 34.67 kg/m as calculated from the  following:   Height as of this encounter: _1  (1.727 m).   Weight as of this encounter: 228 lb (103.4 kg). Psych/Mental status: Alert, oriented x 3 (person, place, & time)       Eyes: PERLA Respiratory: No evidence of acute respiratory distress  Cervical Spine Area Exam  Skin & Axial Inspection: No masses, redness, edema, swelling, or associated skin lesions Alignment: Symmetrical Functional ROM: Unrestricted ROM      Stability: No instability detected Muscle Tone/Strength: Functionally intact. No obvious neuro-muscular anomalies detected. Sensory (Neurological): Unimpaired Palpation: No palpable anomalies              Upper Extremity (UE) Exam    Side: Right upper extremity  Side: Left upper extremity  Skin & Extremity Inspection: Skin color, temperature, and hair growth are WNL. No peripheral edema or cyanosis. No masses, redness, swelling, asymmetry, or associated skin lesions. No contractures.  Skin & Extremity Inspection: Skin color, temperature, and hair growth are WNL. No peripheral edema or cyanosis. No masses, redness, swelling, asymmetry, or associated skin lesions. No contractures.  Functional ROM: Unrestricted ROM          Functional ROM: Unrestricted ROM          Muscle Tone/Strength: Functionally intact. No obvious neuro-muscular anomalies detected.  Muscle Tone/Strength: Functionally intact. No obvious neuro-muscular anomalies detected.  Sensory (Neurological): Unimpaired          Sensory (Neurological): Unimpaired  Palpation: No palpable anomalies              Palpation: No palpable anomalies              Specialized Test(s): Deferred         Specialized Test(s): Deferred          Thoracic Spine Area Exam  Skin & Axial Inspection: No masses, redness, or swelling Alignment: Symmetrical Functional ROM: Unrestricted ROM Stability: No instability detected Muscle Tone/Strength: Functionally intact. No obvious neuro-muscular anomalies detected. Sensory  (Neurological): Unimpaired Muscle strength & Tone: No palpable anomalies  Lumbar Spine Area Exam  Skin & Axial Inspection: No masses, redness, or swelling Alignment: Symmetrical Functional ROM: Unrestricted ROM      Stability: No instability detected Muscle Tone/Strength: Functionally intact. No obvious neuro-muscular anomalies detected. Sensory (Neurological): Unimpaired Palpation: No palpable anomalies       Provocative Tests: Lumbar Hyperextension and rotation test: evaluation deferred today       Lumbar Lateral bending test: evaluation deferred today       Patrick's Maneuver: evaluation deferred today                    Gait & Posture Assessment  Ambulation: Unassisted Gait: Relatively normal for age and body habitus Posture: WNL   Lower Extremity Exam    Side: Right lower extremity  Side: Left lower extremity  Skin & Extremity Inspection: Skin color, temperature, and hair growth are WNL. No peripheral edema or cyanosis. No masses, redness, swelling, asymmetry, or associated skin lesions. No contractures.  Skin & Extremity Inspection: Skin color, temperature, and hair growth are WNL. No peripheral edema or cyanosis. No masses, redness, swelling, asymmetry, or associated skin lesions. No contractures.  Functional ROM: Unrestricted ROM          Functional ROM: Unrestricted ROM          Muscle Tone/Strength: Functionally intact. No obvious neuro-muscular anomalies detected.  Muscle Tone/Strength: Functionally intact. No obvious neuro-muscular anomalies detected.  Sensory (Neurological): Unimpaired  Sensory (Neurological): Unimpaired  Palpation: No palpable anomalies  Palpation: No palpable anomalies   Assessment  Primary Diagnosis & Pertinent Problem List: The primary encounter diagnosis was Chronic low back pain (Location of Tertiary source of pain) (Bilateral) (R>L). Diagnoses of Chronic hip pain (Location of Secondary source of pain) (Bilateral) (R>L), Chronic knee pain (Bilateral)  (R>L), and Chronic pain syndrome were also pertinent to this visit.  Status Diagnosis  Controlled Controlled Controlled 1. Chronic low back pain (Location of Tertiary source of pain) (Bilateral) (R>L)   2. Chronic hip pain (Location of Secondary source of pain) (Bilateral) (R>L)   3. Chronic knee pain (Bilateral) (R>L)   4. Chronic pain syndrome     Problems updated and reviewed during this visit: No problems updated. Plan of Care  Pharmacotherapy (Medications Ordered): Meds ordered this encounter  Medications  . oxyCODONE (OXY IR/ROXICODONE) 5 MG immediate release tablet    Sig: Take 1 tablet (5 mg total) by mouth every 6 (six) hours as needed for severe pain.    Dispense:  120 tablet    Refill:  0    Do not place this medication, or any other prescription from our practice, on "Automatic Refill". Patient may have prescription filled one day early if pharmacy is closed on scheduled refill date. Do not fill until:07/18/2017 To last until: 08/17/2017    Order Specific Question:   Supervising Provider    Answer:   Milinda Pointer [phone]  .  oxyCODONE (OXY IR/ROXICODONE) 5 MG immediate release tablet    Sig: Take 1 tablet (5 mg total) by mouth every 6 (six) hours as needed for severe pain.    Dispense:  120 tablet    Refill:  0    Do not place this medication, or any other prescription from our practice, on "Automatic Refill". Patient may have prescription filled one day early if pharmacy is closed on scheduled refill date. Do not fill until: 06/18/2017 To last until:07/18/2017    Order Specific Question:   Supervising Provider    Answer:   Milinda Pointer [phone]  . oxyCODONE (OXY IR/ROXICODONE) 5 MG immediate release tablet    Sig: Take 1 tablet (5 mg total) by mouth every 6 (six) hours as needed for severe pain.    Dispense:  120 tablet    Refill:  0    Do not place this medication, or any other prescription from our practice, on "Automatic Refill". Patient may have  prescription filled one day early if pharmacy is closed on scheduled refill date. Do not fill until: 05/19/2017 To last until:06/18/2017    Order Specific Question:   Supervising Provider    Answer:   Milinda Pointer [phone]  This SmartLink is deprecated. Use AVSMEDLIST instead to display the medication list for a patient. Medications administered today: Elizar Alpern had no medications administered during this visit. Lab-work, procedure(s), and/or referral(s): No orders of the defined types were placed in this encounter.  Imaging and/or referral(s): None  Interventional therapies: Planned, scheduled, and/or pending:  None at this time    Considering:  (Stop Coumadin for 5 days prior to procedure) Left sided suprascapular nerve radiofrequency ablation    Palliative PRN treatment(s):  (Stop Coumadin for 5 days prior to procedure) Suprascapular nerve block  Possible suprascapular nerve radiofrequency ablation    Provider-requested follow-up: Return in about 3 months (around 08/13/2017) for MedMgmt with Me Donella Stade Edison Pace).  Future Appointments  Date Time Provider [place]  08/07/2017  9:00 AM Vevelyn Francois, NP [facility] None   Primary Care Physician: Neomia Dear, MD Location: [facility] Note by: Vevelyn Francois NP Date: 05/15/2017; Time: 4:00 PM  Pain Score Disclaimer: We use the NRS-11 scale. This is a self-reported, subjective measurement of pain severity with only modest accuracy. It is used primarily to identify changes within a particular patient. It must be understood that outpatient pain scales are significantly less accurate that those used for research, where they can be applied under ideal controlled circumstances with minimal exposure to variables. In reality, the score is likely to be a combination of pain intensity and pain affect, where pain affect describes the degree of emotional arousal or changes in action  readiness caused by the sensory experience of pain. Factors such as social and work situation, setting, emotional state, anxiety levels, expectation, and prior pain experience may influence pain perception and show large inter-individual differences that may also be affected by time variables.  Patient instructions provided during this appointment: Patient Instructions    ____________________________________________________________________________________________  Medication Rules  Applies to: All patients receiving prescriptions (written or electronic).  Pharmacy of record: Pharmacy where electronic prescriptions will be sent. If written prescriptions are taken to a different pharmacy, please inform the nursing staff. The pharmacy listed in the electronic medical record should be the one where you would like electronic prescriptions to be sent.  Prescription refills: Only during scheduled appointments. Applies to both, written and electronic prescriptions.  NOTE: The  following applies primarily to controlled substances (Opioid* Pain Medications).   Patient's responsibilities: 1. Pain Pills: Bring all pain pills to every appointment (except for procedure appointments). 2. Pill Bottles: Bring pills in original pharmacy bottle. Always bring newest bottle. Bring bottle, even if empty. 3. Medication refills: You are responsible for knowing and keeping track of what medications you need refilled. The day before your appointment, write a list of all prescriptions that need to be refilled. Bring that list to your appointment and give it to the admitting nurse. Prescriptions will be written only during appointments. If you forget a medication, it will not be "Called in", "Faxed", or "electronically sent". You will need to get another appointment to get these prescribed. 4. Prescription Accuracy: You are responsible for carefully inspecting your prescriptions before leaving our office. Have the  discharge nurse carefully go over each prescription with you, before taking them home. Make sure that your name is accurately spelled, that your address is correct. Check the name and dose of your medication to make sure it is accurate. Check the number of pills, and the written instructions to make sure they are clear and accurate. Make sure that you are given enough medication to last until your next medication refill appointment. 5. Taking Medication: Take medication as prescribed. Never take more pills than instructed. Never take medication more frequently than prescribed. Taking less pills or less frequently is permitted and encouraged, when it comes to controlled substances (written prescriptions).  6. Inform other Doctors: Always inform, all of your healthcare providers, of all the medications you take. 7. Pain Medication from other Providers: You are not allowed to accept any additional pain medication from any other Doctor or Healthcare provider. There are two exceptions to this rule. (see below) In the event that you require additional pain medication, you are responsible for notifying us, as stated below. 8. Medication Agreement: You are responsible for carefully reading and following our Medication Agreement. This must be signed before receiving any prescriptions from our practice. Safely store a copy of your signed Agreement. Violations to the Agreement will result in no further prescriptions. (Additional copies of our Medication Agreement are available upon request.) 9. Laws, Rules, & Regulations: All patients are expected to follow all Federal and [employer], [employer], Rules, [employer]. Ignorance of the Laws does not constitute a valid excuse. The use of any illegal substances is prohibited. 10. Adopted CDC guidelines & recommendations: Target dosing levels will be at or below 60 MME/day. Use of benzodiazepines** is not recommended.  Exceptions: There are only two exceptions to the rule of  not receiving pain medications from other Healthcare Providers. 1. Exception #1 (Emergencies): In the event of an emergency (i.e.: accident requiring emergency care), you are allowed to receive additional pain medication. However, you are responsible for: As soon as you are able, call our office [phone], at any time of the day or night, and leave a message stating your name, the date and nature of the emergency, and the name and dose of the medication prescribed. In the event that your call is answered by a member of our staff, make sure to document and save the date, time, and the name of the person that took your information.  2. Exception #2 (Planned Surgery): In the event that you are scheduled by another doctor or dentist to have any type of surgery or procedure, you are allowed (for a period no longer than 30 days), to receive additional pain medication, for  the acute post-op pain. However, in this case, you are responsible for picking up a copy of our "Post-op Pain Management for Surgeons" handout, and giving it to your surgeon or dentist. This document is available at our office, and does not require an appointment to obtain it. Simply go to our office during business hours (Monday-Thursday from 8:00 AM to 4:00 PM) (Friday 8:00 AM to 12:00 Noon) or if you have a scheduled appointment with [place], prior to your surgery, and ask for it by name. In addition, you will need to provide [place] with your name, name of your surgeon, type of surgery, and date of procedure or surgery.  *Opioid medications include: morphine, codeine, oxycodone, oxymorphone, hydrocodone, hydromorphone, meperidine, tramadol, tapentadol, buprenorphine, fentanyl, methadone. **Benzodiazepine medications include: diazepam (Valium), alprazolam (Xanax), clonazepam (Klonopine), lorazepam (Ativan), clorazepate (Tranxene), chlordiazepoxide (Librium), estazolam (Prosom), oxazepam (Serax), temazepam (Restoril), triazolam  (Halcion)  ____________________________________________________________________________________________   BMI interpretation table: BMI level Category Range association with higher incidence of chronic pain  <18 kg/m2 Underweight   18.5-24.9 kg/m2 Ideal body weight   25-29.9 kg/m2 Overweight Increased incidence by 20%  30-34.9 kg/m2 Obese (Class I) Increased incidence by 68%  35-39.9 kg/m2 Severe obesity (Class II) Increased incidence by 136%  >40 kg/m2 Extreme obesity (Class III) Increased incidence by 254%   BMI Readings from Last 4 Encounters:  05/15/17 34.67 kg/m  02/13/17 33.75 kg/m  12/21/16 34.67 kg/m  11/07/16 34.97 kg/m   Wt Readings from Last 4 Encounters:  05/15/17 228 lb (103.4 kg)  02/13/17 222 lb (100.7 kg)  12/21/16 228 lb (103.4 kg)  11/07/16 230 lb (104.3 kg)

## 2017-05-20 ENCOUNTER — Ambulatory Visit
Admission: EM | Admit: 2017-05-20 | Discharge: 2017-05-20 | Disposition: A | Discharge status: Home / Self Care | Payer: Medicare Other

## 2017-05-21 ENCOUNTER — Encounter: Payer: Self-pay | Admitting: *Deleted

## 2017-05-21 ENCOUNTER — Other Ambulatory Visit: Payer: Self-pay

## 2017-05-21 ENCOUNTER — Ambulatory Visit
Admission: EM | Admit: 2017-05-21 | Discharge: 2017-05-21 | Disposition: A | Discharge status: Home / Self Care | Payer: Medicare Other | Attending: Family Medicine | Admitting: Family Medicine

## 2017-05-21 DIAGNOSIS — R21 Rash and other nonspecific skin eruption: Secondary | ICD-10-CM

## 2017-05-21 DIAGNOSIS — L309 Dermatitis, unspecified: Secondary | ICD-10-CM | POA: Diagnosis not present

## 2017-05-21 MED ORDER — CETIRIZINE HCL 10 MG PO CAPS: ORAL_CAPSULE | ORAL | 0 refills | Status: DC

## 2017-05-21 MED ORDER — METHYLPREDNISOLONE 4 MG PO TBPK: ORAL_TABLET | ORAL | 0 refills | Status: DC

## 2017-05-21 MED ORDER — TRIAMCINOLONE ACETONIDE 0.1 % EX CREA: 1.0000 "application " | TOPICAL_CREAM | Freq: Two times a day (BID) | CUTANEOUS | 0 refills | Status: DC

## 2017-05-21 NOTE — ED Provider Notes (Addendum)
[facility]    CSN: [id number] Arrival date & time: 05/21/17  0841     History   Chief Complaint Chief Complaint  Patient presents with  . Rash    HPI Aaron Short is a 71 y.o. male.   HPI  Is a 71 year old male who started having an itching rash that began on the top of his feet spreading to his body.  This started about 5 days ago.  He has no previous history of rash.  Made no changes in his personal care items  No past history of eczema.  Recent change in medications was Xarelto about 2 months ago.  According to [employer], also can cause pruritus.          Past Medical History:  Diagnosis Date  . Arthralgia of lower leg 04/02/2012  . Arthralgia of upper arm 06/18/2009  . Cancer ([place])   . Diabetes mellitus without complication ([place])   . FH: atrial fibrillation   . Hypertension     Patient Active Problem List   Diagnosis Date Noted  . Chronic acromioclavicular joint pain (Left) 09/05/2016  . 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 Acromioclavicular arthrosis (Bilateral) (L>R) 09/05/2016  . Arthralgia of acromioclavicular joint (Bilateral) (L>R) 09/05/2016  . Obesity, Class I, BMI 30-34.9 05/11/2016  . Chronic pain syndrome 05/11/2016  . FH: atrial fibrillation   . Smoker 01/07/2016  . Disturbance of skin sensation 11/17/2015  . Lumbar facet syndrome (Bilateral) (R>L) 08/16/2015  . Lumbar spondylosis 08/16/2015  . Continuous opioid dependence ([place]) 07/05/2015  . Long term current use of opiate analgesic 05/18/2015  . Long term prescription opiate use 05/18/2015  . Opiate use (97.5 MME/Day) 05/18/2015  . Encounter for therapeutic drug level monitoring 05/18/2015  . Encounter for chronic pain management 05/18/2015  . Opioid dependence, daily use ([place]) 05/18/2015  . Chronic hip pain (Location of Secondary source of pain) (Bilateral) (R>L) 05/18/2015  . Chronic shoulder pain (Location of Primary Source of Pain) (Bilateral)  (L>R) 05/18/2015  . Chronic low back pain (Location of Tertiary source of pain) (Bilateral) (R>L) 05/18/2015  . Chronic knee pain (Bilateral) (R>L) 05/18/2015  . S/P THR: total hip replacement (Bilateral) 05/18/2015  . S/P TKR: total knee replacement (Bilateral) 05/18/2015  . Type 2 diabetes mellitus ([place]) 05/18/2015  . History of bladder cancer 05/18/2015  . History of hip fracture 05/18/2015  . History of pelvic fracture 05/18/2015  . History of anticoagulant therapy 05/18/2015  . Chronic neck pain 05/18/2015  . Chondrocalcinosis 05/18/2015  . Chronic shoulder pain ([place]) 05/18/2015  . History of shoulder surgery 05/18/2015  . Myofascial pain 05/18/2015  . Musculoskeletal pain 05/18/2015  . Muscle spasm 05/18/2015  . Pure hypercholesterolemia 12/31/2014  . Essential hypertension 12/31/2014  . Muscle spasticity 07/30/2014  . Malignant neoplasm of urinary bladder ([place]) 03/26/2014  . Malignant neoplasm of bladder ([place]) 10/23/2013  . Malignant neoplasm of overlapping sites of bladder ([place]) 10/23/2013  . Atrial fibrillation ([place]) 01/14/2013  . Coumadin anticoagulation 01/14/2013  . Combined fat and carbohydrate induced hyperlipemia 06/18/2009  . Morbid obesity ([place]) 06/18/2009  . Hypertension, benign 06/18/2009  . Impaired fasting glucose 06/18/2009    Past Surgical History:  Procedure Laterality Date  . bladder cancer surgery  2015  . JOINT REPLACEMENT    . REPLACEMENT TOTAL HIP W/  RESURFACING IMPLANTS Bilateral   . SHOULDER SURGERY Bilateral   . TOTAL KNEE ARTHROPLASTY Bilateral        Home Medications    Prior  to Admission medications   Medication Sig Start Date End Date Taking? Authorizing Provider  carisoprodol (SOMA) 350 MG tablet Take 350 mg by mouth 2 (two) times daily.    Yes [provider]  digoxin (DIGOX) 0.125 MG tablet TAKE 1 TABLET BY MOUTH EVERY DAY 11/25/15  Yes [provider]  Melatonin 5 MG TABS Take 5 mg by mouth daily as  needed.    Yes [provider]  metoprolol succinate (TOPROL-XL) 25 MG 24 hr tablet Take 25 mg by mouth. 02/10/16  Yes [provider]  oxyCODONE (OXY IR/ROXICODONE) 5 MG immediate release tablet Take 1 tablet (5 mg total) by mouth every 6 (six) hours as needed for severe pain. 07/18/17 08/17/17 Yes Vevelyn Francois, NP  rivaroxaban (XARELTO) 20 MG TABS tablet Take 20 mg by mouth. 03/26/17  Yes [provider]  rosuvastatin (CRESTOR) 20 MG tablet Take 20 mg by mouth daily.  03/22/16 05/21/17 Yes [provider]  simvastatin (ZOCOR) 40 MG tablet Take 40 mg by mouth at bedtime.  02/16/14  Yes [provider]  Cetirizine HCl (ZYRTEC ALLERGY) 10 MG CAPS Take 1 daily for itching 05/21/17   Crecencio Mc P, PA-C  furosemide (LASIX) 20 MG tablet TAKE 1 TABLET(20 MG) BY MOUTH DAILY AS NEEDED FOR SWELLING 01/08/17 07/07/17  [provider]  methylPREDNISolone (MEDROL DOSEPAK) 4 MG TBPK tablet Take per package instructions 05/21/17   Lorin Picket, PA-C  metoprolol succinate (TOPROL-XL) 25 MG 24 hr tablet Take 25 mg by mouth. 02/10/16 02/09/17  [provider]  Naloxone HCl (NARCAN) 4 MG/0.1ML LIQD Place 1 spray into the nose once. Spray half of bottle content into each nostril, then call 911 08/16/15   Milinda Pointer, MD  triamcinolone cream (KENALOG) 0.1 % Apply 1 application topically 2 (two) times daily. 05/21/17   Lorin Picket, PA-C    Family History Family History  Problem Relation Age of Onset  . Dementia Mother   . Heart disease Father     Social History Social History   Tobacco Use  . Smoking status: Current Some Day Smoker    Types: Cigarettes  . Smokeless tobacco: Never Used  Substance Use Topics  . Alcohol use: No    Alcohol/week: 0.0 oz  . Drug use: No     Allergies   Diltiazem; Penicillins; and Tizanidine   Review of Systems Review of Systems  Constitutional: Positive for activity change. Negative for  chills, fatigue and fever.  Skin: Positive for rash.  All other systems reviewed and are negative.    Physical Exam Triage Vital Signs ED Triage Vitals  Enc Vitals Group     BP 05/21/17 0859 129/68     Pulse Rate 05/21/17 0859 82     Resp 05/21/17 0859 16     Temp 05/21/17 0859 97.7 F (36.5 C)     Temp Source 05/21/17 0859 Oral     SpO2 05/21/17 0859 96 %     Weight --      Height --      Head Circumference --      Peak Flow --      Pain Score 05/21/17 0902 0     Pain Loc --      Pain Edu? --      Excl. in [place]? --    No data found.  Updated Vital Signs BP 129/68 (BP Location: Left Arm)   Pulse 82   Temp 97.7 F (36.5 C) (Oral)  Resp 16   SpO2 96%   Visual Acuity Right Eye Distance:   Left Eye Distance:   Bilateral Distance:    Right Eye Near:   Left Eye Near:    Bilateral Near:     Physical Exam  Constitutional: He is oriented to person, place, and time. He appears well-developed and well-nourished. No distress.  HENT:  Head: Normocephalic.  Eyes: Pupils are equal, round, and reactive to light.  Neck: Normal range of motion.  Musculoskeletal: Normal range of motion.  Neurological: He is alert and oriented to person, place, and time.  Skin: Skin is warm and dry. Rash noted. He is not diaphoretic.  Examination of the skin shows diffuse small 1 mm to 2 mm macular papular rash heard about the body.  His excoriations present.  Refer to photographs for detail  Psychiatric: He has a normal mood and affect. His behavior is normal. Judgment and thought content normal.  Nursing note and vitals reviewed.          UC Treatments / Results  Labs (all labs ordered are listed, but only abnormal results are displayed) Labs Reviewed - No data to display  EKG  EKG Interpretation None       Radiology No results found.  Procedures Procedures (including critical care time)  Medications Ordered in UC Medications - No data to display   Initial  Impression / Assessment and Plan / UC Course  I have reviewed the triage vital signs and the nursing notes.  Pertinent labs & imaging results that were available during my care of the patient were reviewed by me and considered in my medical decision making (see chart for details).     Plan: 1. Test/x-ray results and diagnosis reviewed with patient 2. rx as per orders; risks, benefits, potential side effects reviewed with patient 3. Recommend supportive treatment with use of triamcinolone cream to areas of the intense itching.  We will place him on a short course of prednisone.  Also recommended to not take hot baths.  He should apply quality emollients to his skin twice daily particularly after bathing.  He should contact his provider regarding his Xarelto.  He is not improving I recommend he follow-up with a dermatologist.  Recommend Benadryl at nighttime and Zyrtec during daytime to help control itching as well 4. F/u prn if symptoms worsen or don't improve   Final Clinical Impressions(s) / UC Diagnoses   Final diagnoses:  Eczema, unspecified type    ED Discharge Orders        Ordered    methylPREDNISolone (MEDROL DOSEPAK) 4 MG TBPK tablet     05/21/17 1022    triamcinolone cream (KENALOG) 0.1 %  2 times daily     05/21/17 1022    Cetirizine HCl (ZYRTEC ALLERGY) 10 MG CAPS     05/21/17 1022       Controlled Substance Prescriptions [place] Controlled Substance Registry consulted? Not Applicable   Lorin Picket, PA-C 05/21/17 1035    Lorin Picket, PA-C 05/21/17 1036

## 2017-05-21 NOTE — Discharge Instructions (Signed)
Apply a quality emollient to your skin twice daily particularly after bathing or showering.  You should notify your provider that is supplying you with Xarelto that this may possibly be a reaction to the Xarelto if you are not improving.  Would also recommend following up with your dermatologist if you are not improving.  Use Benadryl at nighttime for itching and Zyrtec daytime.  Take warm not hot showers.

## 2017-05-21 NOTE — ED Triage Notes (Signed)
Patient started having symptom of rash on top of feet bilaterally that has spread over the rest of his body 5 days ago. No previous history of rash.

## 2017-07-02 DIAGNOSIS — M62838 Other muscle spasm: Secondary | ICD-10-CM | POA: Diagnosis not present

## 2017-07-02 DIAGNOSIS — E119 Type 2 diabetes mellitus without complications: Secondary | ICD-10-CM | POA: Diagnosis not present

## 2017-07-02 DIAGNOSIS — S0190XA Unspecified open wound of unspecified part of head, initial encounter: Secondary | ICD-10-CM | POA: Insufficient documentation

## 2017-07-02 DIAGNOSIS — M6240 Contracture of muscle, unspecified site: Secondary | ICD-10-CM | POA: Diagnosis not present

## 2017-07-02 DIAGNOSIS — F17210 Nicotine dependence, cigarettes, uncomplicated: Secondary | ICD-10-CM | POA: Diagnosis not present

## 2017-07-19 DIAGNOSIS — C4442 Squamous cell carcinoma of skin of scalp and neck: Secondary | ICD-10-CM | POA: Diagnosis not present

## 2017-07-19 DIAGNOSIS — S0190XA Unspecified open wound of unspecified part of head, initial encounter: Secondary | ICD-10-CM | POA: Diagnosis not present

## 2017-07-19 DIAGNOSIS — L308 Other specified dermatitis: Secondary | ICD-10-CM | POA: Diagnosis not present

## 2017-08-07 ENCOUNTER — Encounter: Payer: Medicare Other | Admitting: Nurse Practitioner

## 2017-08-15 ENCOUNTER — Encounter: Payer: Self-pay | Admitting: Nurse Practitioner

## 2017-08-15 ENCOUNTER — Ambulatory Visit: Payer: Medicare Other | Attending: Nurse Practitioner | Admitting: Nurse Practitioner

## 2017-08-15 VITALS — BP 129/56 | HR 54 | Resp 16 | Ht 68.0 in | Wt 230.0 lb

## 2017-08-15 DIAGNOSIS — Z820 Family history of epilepsy and other diseases of the nervous system: Secondary | ICD-10-CM | POA: Insufficient documentation

## 2017-08-15 DIAGNOSIS — Z7901 Long term (current) use of anticoagulants: Secondary | ICD-10-CM | POA: Insufficient documentation

## 2017-08-15 DIAGNOSIS — M25562 Pain in left knee: Secondary | ICD-10-CM

## 2017-08-15 DIAGNOSIS — M7918 Myalgia, other site: Secondary | ICD-10-CM | POA: Insufficient documentation

## 2017-08-15 DIAGNOSIS — F17210 Nicotine dependence, cigarettes, uncomplicated: Secondary | ICD-10-CM | POA: Insufficient documentation

## 2017-08-15 DIAGNOSIS — M19012 Primary osteoarthritis, left shoulder: Secondary | ICD-10-CM | POA: Diagnosis not present

## 2017-08-15 DIAGNOSIS — Z79899 Other long term (current) drug therapy: Secondary | ICD-10-CM | POA: Insufficient documentation

## 2017-08-15 DIAGNOSIS — M542 Cervicalgia: Secondary | ICD-10-CM | POA: Insufficient documentation

## 2017-08-15 DIAGNOSIS — E119 Type 2 diabetes mellitus without complications: Secondary | ICD-10-CM | POA: Insufficient documentation

## 2017-08-15 DIAGNOSIS — G8929 Other chronic pain: Secondary | ICD-10-CM | POA: Diagnosis not present

## 2017-08-15 DIAGNOSIS — M25512 Pain in left shoulder: Secondary | ICD-10-CM | POA: Diagnosis not present

## 2017-08-15 DIAGNOSIS — M25511 Pain in right shoulder: Secondary | ICD-10-CM | POA: Insufficient documentation

## 2017-08-15 DIAGNOSIS — Z96643 Presence of artificial hip joint, bilateral: Secondary | ICD-10-CM | POA: Insufficient documentation

## 2017-08-15 DIAGNOSIS — I4891 Unspecified atrial fibrillation: Secondary | ICD-10-CM | POA: Insufficient documentation

## 2017-08-15 DIAGNOSIS — Z888 Allergy status to other drugs, medicaments and biological substances status: Secondary | ICD-10-CM | POA: Diagnosis not present

## 2017-08-15 DIAGNOSIS — M25559 Pain in unspecified hip: Secondary | ICD-10-CM

## 2017-08-15 DIAGNOSIS — I10 Essential (primary) hypertension: Secondary | ICD-10-CM | POA: Insufficient documentation

## 2017-08-15 DIAGNOSIS — E7801 Familial hypercholesterolemia: Secondary | ICD-10-CM | POA: Insufficient documentation

## 2017-08-15 DIAGNOSIS — Z96653 Presence of artificial knee joint, bilateral: Secondary | ICD-10-CM | POA: Diagnosis not present

## 2017-08-15 DIAGNOSIS — Z23 Encounter for immunization: Secondary | ICD-10-CM | POA: Insufficient documentation

## 2017-08-15 DIAGNOSIS — Z880 Allergy status to penicillin: Secondary | ICD-10-CM | POA: Insufficient documentation

## 2017-08-15 DIAGNOSIS — Z8249 Family history of ischemic heart disease and other diseases of the circulatory system: Secondary | ICD-10-CM | POA: Insufficient documentation

## 2017-08-15 DIAGNOSIS — S8012XA Contusion of left lower leg, initial encounter: Secondary | ICD-10-CM | POA: Diagnosis not present

## 2017-08-15 DIAGNOSIS — G894 Chronic pain syndrome: Secondary | ICD-10-CM | POA: Insufficient documentation

## 2017-08-15 DIAGNOSIS — M25561 Pain in right knee: Secondary | ICD-10-CM | POA: Diagnosis not present

## 2017-08-15 DIAGNOSIS — Z79891 Long term (current) use of opiate analgesic: Secondary | ICD-10-CM | POA: Insufficient documentation

## 2017-08-15 MED ORDER — OXYCODONE HCL 5 MG PO TABS: 5.0000 mg | ORAL_TABLET | Freq: Four times a day (QID) | ORAL | 0 refills | Status: DC | PRN

## 2017-08-15 NOTE — Patient Instructions (Addendum)
You have been given 3 Rx for Oxycodone to last until 6/20/2019____________________________________________________________________________________________  Medication Rules  Applies to: All patients receiving prescriptions (written or electronic).  Pharmacy of record: Pharmacy where electronic prescriptions will be sent. If written prescriptions are taken to a different pharmacy, please inform the nursing staff. The pharmacy listed in the electronic medical record should be the one where you would like electronic prescriptions to be sent.  Prescription refills: Only during scheduled appointments. Applies to both, written and electronic prescriptions.  NOTE: The following applies primarily to controlled substances (Opioid* Pain Medications).   Patient's responsibilities: 1. Pain Pills: Bring all pain pills to every appointment (except for procedure appointments). 2. Pill Bottles: Bring pills in original pharmacy bottle. Always bring newest bottle. Bring bottle, even if empty. 3. Medication refills: You are responsible for knowing and keeping track of what medications you need refilled. The day before your appointment, write a list of all prescriptions that need to be refilled. Bring that list to your appointment and give it to the admitting nurse. Prescriptions will be written only during appointments. If you forget a medication, it will not be "Called in", "Faxed", or "electronically sent". You will need to get another appointment to get these prescribed. 4. Prescription Accuracy: You are responsible for carefully inspecting your prescriptions before leaving our office. Have the discharge nurse carefully go over each prescription with you, before taking them home. Make sure that your name is accurately spelled, that your address is correct. Check the name and dose of your medication to make sure it is accurate. Check the number of pills, and the written instructions to make sure they are clear and  accurate. Make sure that you are given enough medication to last until your next medication refill appointment. 5. Taking Medication: Take medication as prescribed. Never take more pills than instructed. Never take medication more frequently than prescribed. Taking less pills or less frequently is permitted and encouraged, when it comes to controlled substances (written prescriptions).  6. Inform other Doctors: Always inform, all of your healthcare providers, of all the medications you take. 7. Pain Medication from other Providers: You are not allowed to accept any additional pain medication from any other Doctor or Healthcare provider. There are two exceptions to this rule. (see below) In the event that you require additional pain medication, you are responsible for notifying us, as stated below. 8. Medication Agreement: You are responsible for carefully reading and following our Medication Agreement. This must be signed before receiving any prescriptions from our practice. Safely store a copy of your signed Agreement. Violations to the Agreement will result in no further prescriptions. (Additional copies of our Medication Agreement are available upon request.) 9. Laws, Rules, & Regulations: All patients are expected to follow all Federal and [employer], [employer], Rules, [employer]. Ignorance of the Laws does not constitute a valid excuse. The use of any illegal substances is prohibited. 10. Adopted CDC guidelines & recommendations: Target dosing levels will be at or below 60 MME/day. Use of benzodiazepines** is not recommended.  Exceptions: There are only two exceptions to the rule of not receiving pain medications from other Healthcare Providers. 1. Exception #1 (Emergencies): In the event of an emergency (i.e.: accident requiring emergency care), you are allowed to receive additional pain medication. However, you are responsible for: As soon as you are able, call our office [phone], at any  time of the day or night, and leave a message stating your name, the date and nature  of the emergency, and the name and dose of the medication prescribed. In the event that your call is answered by a member of our staff, make sure to document and save the date, time, and the name of the person that took your information.  2. Exception #2 (Planned Surgery): In the event that you are scheduled by another doctor or dentist to have any type of surgery or procedure, you are allowed (for a period no longer than 30 days), to receive additional pain medication, for the acute post-op pain. However, in this case, you are responsible for picking up a copy of our "Post-op Pain Management for Surgeons" handout, and giving it to your surgeon or dentist. This document is available at our office, and does not require an appointment to obtain it. Simply go to our office during business hours (Monday-Thursday from 8:00 AM to 4:00 PM) (Friday 8:00 AM to 12:00 Noon) or if you have a scheduled appointment with [place], prior to your surgery, and ask for it by name. In addition, you will need to provide [place] with your name, name of your surgeon, type of surgery, and date of procedure or surgery.  *Opioid medications include: morphine, codeine, oxycodone, oxymorphone, hydrocodone, hydromorphone, meperidine, tramadol, tapentadol, buprenorphine, fentanyl, methadone. **Benzodiazepine medications include: diazepam (Valium), alprazolam (Xanax), clonazepam (Klonopine), lorazepam (Ativan), clorazepate (Tranxene), chlordiazepoxide (Librium), estazolam (Prosom), oxazepam (Serax), temazepam (Restoril), triazolam (Halcion) (Last updated: 07/26/2017) ____________________________________________________________________________________________   BMI Assessment: Estimated body mass index is 34.97 kg/m as calculated from the following:   Height as of this encounter: 5\' 8"  (1.727 m).   Weight as of this encounter: 230 lb (104.3 kg).  BMI  interpretation table: BMI level Category Range association with higher incidence of chronic pain  <18 kg/m2 Underweight   18.5-24.9 kg/m2 Ideal body weight   25-29.9 kg/m2 Overweight Increased incidence by 20%  30-34.9 kg/m2 Obese (Class I) Increased incidence by 68%  35-39.9 kg/m2 Severe obesity (Class II) Increased incidence by 136%  >40 kg/m2 Extreme obesity (Class III) Increased incidence by 254%   BMI Readings from Last 4 Encounters:  08/15/17 34.97 kg/m  05/15/17 34.67 kg/m  02/13/17 33.75 kg/m  12/21/16 34.67 kg/m   Wt Readings from Last 4 Encounters:  08/15/17 230 lb (104.3 kg)  05/15/17 228 lb (103.4 kg)  02/13/17 222 lb (100.7 kg)  12/21/16 228 lb (103.4 kg)

## 2017-08-15 NOTE — Progress Notes (Signed)
Nursing Pain Medication Assessment:  Safety precautions to be maintained throughout the outpatient stay will include: orient to surroundings, keep bed in low position, maintain call bell within reach at all times, provide assistance with transfer out of bed and ambulation.  Medication Inspection Compliance: Pill count conducted under aseptic conditions, in front of the patient. Neither the pills nor the bottle was removed from the patient's sight at any time. Once count was completed pills were immediately returned to the patient in their original bottle.  Medication: Oxycodone IR Pill/Patch Count: 4 of 120 pills remain Pill/Patch Appearance: Markings consistent with prescribed medication Bottle Appearance: Standard pharmacy container. Clearly labeled. Filled Date: 02 / 20 / 2019 Last Medication intake:  Yesterday   Patient reports that he is using CBD oil prn orally

## 2017-08-15 NOTE — Progress Notes (Signed)
Patient's Name: Aaron Short  MRN: [id number]  Referring Provider: Neomia Dear, MD  DOB: [birth year]  PCP: Neomia Dear, MD  DOS: 08/15/2017  Note by: Vevelyn Francois NP  Service setting: Ambulatory outpatient  Specialty: Interventional Pain Management  Location: [facility] (AMB) Pain Management Facility    Patient type: Established    Primary Reason(s) for Visit: Encounter for prescription drug management. (Level of risk: moderate)  CC: Shoulder Pain (worse on the left) and Generalized Body Aches (states he hurts all over )  HPI  Aaron Short is a 72 y.o. year old, male patient, who comes today for a medication management evaluation. He has Long term current use of opiate analgesic; Long term prescription opiate use; Opiate use (97.5 MME/Day); Encounter for therapeutic drug level monitoring; Encounter for chronic pain management; Opioid dependence, daily use ([place]); Chronic hip pain (Location of Secondary source of pain) (Bilateral) (R>L); Chronic shoulder pain (Location of Primary Source of Pain) (Bilateral) (L>R); Chronic low back pain (Location of Tertiary source of pain) (Bilateral) (R>L); Chronic knee pain (Bilateral) (R>L); S/P THR: total hip replacement (Bilateral); S/P TKR: total knee replacement (Bilateral); Atrial fibrillation ([place]); Malignant neoplasm of urinary bladder ([place]); Type 2 diabetes mellitus ([place]); Muscle spasticity; Coumadin anticoagulation; Combined fat and carbohydrate induced hyperlipemia; Pure hypercholesterolemia; History of bladder cancer; History of hip fracture; History of pelvic fracture; History of anticoagulant therapy; Chronic neck pain; Chondrocalcinosis; Chronic shoulder pain ([place]); History of shoulder surgery; Myofascial pain; Musculoskeletal pain; Muscle spasm; Lumbar facet syndrome (Bilateral) (R>L); Lumbar spondylosis; Morbid obesity ([place]); Continuous opioid dependence ([place]); Disturbance of skin sensation; Malignant neoplasm of bladder ([place]);  Smoker; FH: atrial fibrillation; Malignant neoplasm of overlapping sites of bladder ([place]); Obesity, Class I, BMI 30-34.9; Chronic pain syndrome; Hypertension, benign; Impaired fasting glucose; Chronic acromioclavicular joint pain (Left); Osteoarthritis of shoulder (Bilateral) (L>R); Acromioclavicular arthrosis (Bilateral) (L>R); Arthralgia of acromioclavicular joint (Bilateral) (L>R); Essential hypertension; Chronic wound of head; Hematoma of leg, left, initial encounter; Need for influenza vaccination; and Involuntary muscle contractions on their problem list. His primarily concern today is the Shoulder Pain (worse on the left) and Generalized Body Aches (states he hurts all over )  Pain Assessment: Location: Left, Right Shoulder Radiating: down both arms, left is the worse  Onset: More than a month ago Duration: Chronic pain Quality: Dull, Discomfort Severity: 1 /10 (self-reported pain score)  Note: Reported level is compatible with observation.                          Effect on ADL: unable to sleep on the right side Timing: Constant Modifying factors: medications  Aaron Short was last scheduled for an appointment on 05/15/2017 for medication management. During today's appointment we reviewed Aaron Short chronic pain status, as well as his outpatient medication regimen. He denies any change in his pain. He admits that he has been taking CBD oil at night when he goes to be bed. He is not sure if it is effective for his pain.   The patient  reports that he does not use drugs. His body mass index is 34.97 kg/m.  Further details on both, my assessment(s), as well as the proposed treatment plan, please see below.  Controlled Substance Pharmacotherapy Assessment REMS (Risk Evaluation and Mitigation Strategy)  Analgesic:Oxycodone IR 5 mg every 6hours (5 mg/dayof oxycodone)  MME:22.11mq per day   PJanett Billow RN  08/15/2017  8:17 AM  Sign at close encounter Nursing  Pain Medication Assessment:  Safety precautions to be maintained throughout the outpatient stay will include: orient to surroundings, keep bed in low position, maintain call bell within reach at all times, provide assistance with transfer out of bed and ambulation.  Medication Inspection Compliance: Pill count conducted under aseptic conditions, in front of the patient. Neither the pills nor the bottle was removed from the patient's sight at any time. Once count was completed pills were immediately returned to the patient in their original bottle.  Medication: Oxycodone IR Pill/Patch Count: 4 of 120 pills remain Pill/Patch Appearance: Markings consistent with prescribed medication Bottle Appearance: Standard pharmacy container. Clearly labeled. Filled Date: 02 / 20 / 2019 Last Medication intake:  Yesterday   Patient reports that he is using CBD oil prn orally   Pharmacokinetics: Liberation and absorption (onset of action): WNL Distribution (time to peak effect): WNL Metabolism and excretion (duration of action): WNL         Pharmacodynamics: Desired effects: Analgesia: Aaron Short reports >50% benefit. Functional ability: Patient reports that medication allows him to accomplish basic ADLs Clinically meaningful improvement in function (CMIF): Sustained CMIF goals met Perceived effectiveness: Described as relatively effective, allowing for increase in activities of daily living (ADL) Undesirable effects: Side-effects or Adverse reactions: None reported Monitoring: [place] PMP: Online review of the past 105-monthperiod conducted. Compliant with practice rules and regulations Last UDS on record: Summary  Date Value Ref Range Status  12/21/2016 FINAL  Final    Comment:    ==================================================================== TOXASSURE SELECT 13 (MW) ==================================================================== Test                             Result       Flag        Units Drug Present and Declared for Prescription Verification   Oxycodone                      575          EXPECTED   ng/mg creat   Oxymorphone                    2852         EXPECTED   ng/mg creat   Noroxycodone                   2074         EXPECTED   ng/mg creat   Noroxymorphone                 1090         EXPECTED   ng/mg creat    Sources of oxycodone are scheduled prescription medications.    Oxymorphone, noroxycodone, and noroxymorphone are expected    metabolites of oxycodone. Oxymorphone is also available as a    scheduled prescription medication. ==================================================================== Test                      Result    Flag   Units      Ref Range   Creatinine              145              mg/dL      >=20 ==================================================================== Declared Medications:  The flagging and interpretation on this report are based on the  following declared medications.  Unexpected results may arise from  inaccuracies in the declared medications.  **  Note: The testing scope of this panel includes these medications:  Oxycodone  **Note: The testing scope of this panel does not include following  reported medications:  Carisoprodol (Soma)  Digoxin  Furosemide  Losartan (Losartan Potassium)  Melatonin  Metformin  Metoprolol  Naloxone  Rosuvastatin  Simvastatin  Warfarin ==================================================================== For clinical consultation, please call [phone]. ====================================================================    UDS interpretation: Compliant          Medication Assessment Form: Reviewed. Patient indicates being compliant with therapy Treatment compliance: Compliant Risk Assessment Profile: Aberrant behavior: See prior evaluations. None observed or detected today Comorbid factors increasing risk of overdose: See prior notes. No additional risks detected today Risk  of substance use disorder (SUD): Low Opioid Risk Tool - 08/15/17 0815      Family History of Substance Abuse   Alcohol  Negative    Illegal Drugs  Negative    Rx Drugs  Negative      Personal History of Substance Abuse   Alcohol  Negative    Illegal Drugs  Negative    Rx Drugs  Negative      Psychological Disease   Psychological Disease  Negative    Depression  Negative      Total Score   Opioid Risk Tool Scoring  0    Opioid Risk Interpretation  Low Risk      ORT Scoring interpretation table:  Score <3 = Low Risk for SUD  Score between 4-7 = Moderate Risk for SUD  Score >8 = High Risk for Opioid Abuse   Risk Mitigation Strategies:  Patient Counseling: Covered Patient-Prescriber Agreement (PPA): Present and active  Notification to other healthcare providers: Done  Pharmacologic Plan: No change in therapy, at this time.             Laboratory Chemistry  Inflammation Markers (CRP: Acute Phase) (ESR: Chronic Phase) Lab Results  Component Value Date   CRP <0.5 11/18/2015   ESRSEDRATE 6 11/18/2015                         Rheumatology Markers No results found for: Elayne Guerin, [facility]              Renal Function Markers Lab Results  Component Value Date   BUN 15 11/18/2015   CREATININE 0.80 11/18/2015   GFRAA >60 11/18/2015   GFRNONAA >60 11/18/2015                 Hepatic Function Markers Lab Results  Component Value Date   AST 19 11/18/2015   ALT 17 11/18/2015   ALBUMIN 4.1 11/18/2015   ALKPHOS 68 11/18/2015                 Electrolytes Lab Results  Component Value Date   NA 139 11/18/2015   K 4.3 11/18/2015   CL 103 11/18/2015   CALCIUM 9.3 11/18/2015   MG 1.8 11/18/2015                        Neuropathy Markers Lab Results  Component Value Date   VITAMINB12 268 11/18/2015                 Bone Pathology Markers Lab Results  Component Value Date   25OHVITD1 31 11/18/2015   25OHVITD2 <1.0 11/18/2015    25OHVITD3 30 11/18/2015  Coagulation Parameters No results found for: INR, LABPROT, APTT, PLT, DDIMER               Cardiovascular Markers No results found for: BNP, CKTOTAL, CKMB, TROPONINI, HGB, HCT               CA Markers No results found for: CEA, CA125, LABCA2               Note: Lab results reviewed.  Recent Diagnostic Imaging Results  MR SHOULDER LEFT WO CONTRAST CLINICAL DATA:  Shoulder pain and limited range of motion. Remote history of shoulder surgery.  EXAM: MRI OF THE LEFT SHOULDER WITHOUT CONTRAST  TECHNIQUE: Multiplanar, multisequence MR imaging of the shoulder was performed. No intravenous contrast was administered.  COMPARISON:  Radiographs 11/18/2015  FINDINGS: Rotator cuff: Significant supraspinatus and infraspinatus tendinopathy/tendinosis with articular surface and interstitial tears extending back toward the musculotendinous junction region. No full-thickness retracted tear but probably at risk for such. The subscapularis tendon is intact.  Muscles: Mild fatty atrophy of the shoulder musculature, in particular the supraspinatus and infraspinatus muscles. There are intramuscular ganglion cysts in the infraspinatus muscle likely associated with articular surface tears.  Biceps long head: Not visualized on any of the imaging sequences and likely chronically torn and retracted.  Acromioclavicular Joint: Moderate degenerative changes. Type 1-2 acromion. Mild lateral downsloping and undersurface spurring.  Glenohumeral Joint: Moderate degenerative changes with joint space narrowing an osteophytic spurring. Moderate degenerative chondrosis and small joint effusion. There is thickening of the capsular structures in the axillary recess which can be seen with adhesive capsulitis or synovitis.  Labrum: The superior labrum is degenerated but no obvious tear. The anterior posterior labrum are grossly intact.  Bones: No acute bony  findings. Subchondral cystic change noted in the humeral head.  Other: Moderate subacromial/ subdeltoid bursitis.  IMPRESSION: 1. Significant rotator cuff tendinopathy/tendinosis with interstitial and articular surface tears involving the supraspinatus and infraspinatus tendons. No full thickness retracted tear. 2. Likely chronically torn and retracted long head biceps tendon. 3. Moderate AC joint degenerative changes with mild lateral downsloping and undersurface spurring may contribute to bony impingement. 4. Moderate glenohumeral joint degenerative changes with synovitis or adhesive capsulitis. 5. Moderate subacromial/subdeltoid bursitis  Electronically Signed   By: Marijo Sanes M.D.   On: 10/10/2016 14:16  Complexity Note: Imaging results reviewed. Results shared with Aaron Short, using Layman's terms.                         Meds   Current Outpatient Medications:  .  carisoprodol (SOMA) 350 MG tablet, Take 350 mg by mouth 2 (two) times daily. , Disp: , Rfl:  .  Cetirizine HCl (ZYRTEC ALLERGY) 10 MG CAPS, Take 1 daily for itching, Disp: 30 capsule, Rfl: 0 .  digoxin (DIGOX) 0.125 MG tablet, TAKE 1 TABLET BY MOUTH EVERY DAY, Disp: , Rfl:  .  Melatonin 5 MG TABS, Take 5 mg by mouth daily as needed. , Disp: , Rfl:  .  metoprolol succinate (TOPROL-XL) 25 MG 24 hr tablet, Take 25 mg by mouth., Disp: , Rfl:  .  Naloxone HCl (NARCAN) 4 MG/0.1ML LIQD, Place 1 spray into the nose once. Spray half of bottle content into each nostril, then call 911, Disp: 2 each, Rfl: 0 .  [START ON 10/16/2017] oxyCODONE (OXY IR/ROXICODONE) 5 MG immediate release tablet, Take 1 tablet (5 mg total) by mouth every 6 (six) hours as needed for severe pain., Disp: 120  tablet, Rfl: 0 .  rivaroxaban (XARELTO) 20 MG TABS tablet, Take 20 mg by mouth., Disp: , Rfl:  .  simvastatin (ZOCOR) 40 MG tablet, Take 40 mg by mouth at bedtime. , Disp: , Rfl:  .  triamcinolone cream (KENALOG) 0.1 %, Apply 1 application  topically 2 (two) times daily., Disp: 30 g, Rfl: 0 .  furosemide (LASIX) 20 MG tablet, TAKE 1 TABLET(20 MG) BY MOUTH DAILY AS NEEDED FOR SWELLING, Disp: , Rfl:  .  metoprolol succinate (TOPROL-XL) 25 MG 24 hr tablet, Take 25 mg by mouth., Disp: , Rfl:  .  [START ON 09/16/2017] oxyCODONE (OXY IR/ROXICODONE) 5 MG immediate release tablet, Take 1 tablet (5 mg total) by mouth every 6 (six) hours as needed for severe pain., Disp: 120 tablet, Rfl: 0 .  [START ON 08/17/2017] oxyCODONE (OXY IR/ROXICODONE) 5 MG immediate release tablet, Take 1 tablet (5 mg total) by mouth every 6 (six) hours as needed for severe pain., Disp: 120 tablet, Rfl: 0 .  rosuvastatin (CRESTOR) 20 MG tablet, Take 20 mg by mouth daily. , Disp: , Rfl:   ROS  Constitutional: Denies any fever or chills Gastrointestinal: No reported hemesis, hematochezia, vomiting, or acute GI distress Musculoskeletal: Denies any acute onset joint swelling, redness, loss of ROM, or weakness Neurological: No reported episodes of acute onset apraxia, aphasia, dysarthria, agnosia, amnesia, paralysis, loss of coordination, or loss of consciousness  Allergies  Aaron Short is allergic to diltiazem; penicillins; and tizanidine.  PFSH  Drug: Aaron Short  reports that he does not use drugs. Alcohol:  reports that he does not drink alcohol. Tobacco:  reports that he has been smoking cigarettes.  he has never used smokeless tobacco. Medical:  has a past medical history of Arthralgia of lower leg (04/02/2012), Arthralgia of upper arm (06/18/2009), Cancer ([place]), Diabetes mellitus without complication ([place]), FH: atrial fibrillation, and Hypertension. Surgical: Aaron Short  has a past surgical history that includes bladder cancer surgery (2015); Joint replacement; Replacement total hip w/  resurfacing implants (Bilateral); Total knee arthroplasty (Bilateral); and Shoulder surgery (Bilateral). Family: family history includes Dementia in his mother; Heart  disease in his father.  Constitutional Exam  General appearance: Well nourished, well developed, and well hydrated. In no apparent acute distress Vitals:   08/15/17 0810  BP: (!) 129/56  Pulse: (!) 54  Resp: 16  SpO2: 99%  Weight: 230 lb (104.3 kg)  Height: _0  (1.727 m)  Psych/Mental status: Alert, oriented x 3 (person, place, & time)       Eyes: PERLA Respiratory: No evidence of acute respiratory distress  Cervical Spine Area Exam  Skin & Axial Inspection: No masses, redness, edema, swelling, or associated skin lesions Alignment: Symmetrical Functional ROM: Unrestricted ROM      Stability: No instability detected Muscle Tone/Strength: Functionally intact. No obvious neuro-muscular anomalies detected. Sensory (Neurological): Unimpaired Palpation: No palpable anomalies              Upper Extremity (UE) Exam    Side: Right upper extremity  Side: Left upper extremity  Skin & Extremity Inspection: Skin color, temperature, and hair growth are WNL. No peripheral edema or cyanosis. No masses, redness, swelling, asymmetry, or associated skin lesions. No contractures.  Skin & Extremity Inspection: Skin color, temperature, and hair growth are WNL. No peripheral edema or cyanosis. No masses, redness, swelling, asymmetry, or associated skin lesions. No contractures.  Functional ROM: Adequate ROM          Functional ROM: Adequate ROM  Muscle Tone/Strength: Functionally intact. No obvious neuro-muscular anomalies detected.  Muscle Tone/Strength: Functionally intact. No obvious neuro-muscular anomalies detected.  Sensory (Neurological): Unimpaired          Sensory (Neurological): Unimpaired          Palpation: No palpable anomalies              Palpation: Complains of area being tender to palpation              Specialized Test(s): Deferred         Specialized Test(s): Deferred          Thoracic Spine Area Exam  Skin & Axial Inspection: No masses, redness, or swelling Alignment:  Symmetrical Functional ROM: Unrestricted ROM Stability: No instability detected Muscle Tone/Strength: Functionally intact. No obvious neuro-muscular anomalies detected. Sensory (Neurological): Unimpaired Muscle strength & Tone: No palpable anomalies  Lumbar Spine Area Exam  Skin & Axial Inspection: No masses, redness, or swelling Alignment: Symmetrical Functional ROM: Unrestricted ROM      Stability: No instability detected Muscle Tone/Strength: Functionally intact. No obvious neuro-muscular anomalies detected. Sensory (Neurological): Unimpaired Palpation: No palpable anomalies       Provocative Tests: Lumbar Hyperextension and rotation test: evaluation deferred today       Lumbar Lateral bending test: evaluation deferred today       Patrick's Maneuver: evaluation deferred today                    Gait & Posture Assessment  Ambulation: Unassisted Gait: Relatively normal for age and body habitus Posture: WNL   Lower Extremity Exam    Side: Right lower extremity  Side: Left lower extremity  Skin & Extremity Inspection: Skin color, temperature, and hair growth are WNL. No peripheral edema or cyanosis. No masses, redness, swelling, asymmetry, or associated skin lesions. No contractures.  Skin & Extremity Inspection: Skin color, temperature, and hair growth are WNL. No peripheral edema or cyanosis. No masses, redness, swelling, asymmetry, or associated skin lesions. No contractures.  Functional ROM: Unrestricted ROM          Functional ROM: Unrestricted ROM          Muscle Tone/Strength: Functionally intact. No obvious neuro-muscular anomalies detected.  Muscle Tone/Strength: Functionally intact. No obvious neuro-muscular anomalies detected.  Sensory (Neurological): Unimpaired  Sensory (Neurological): Unimpaired  Palpation: No palpable anomalies  Palpation: No palpable anomalies   Assessment  Primary Diagnosis & Pertinent Problem List: The primary encounter diagnosis was Chronic  shoulder pain (Location of Primary Source of Pain) (Bilateral) (L>R). Diagnoses of Chronic hip pain (Location of Secondary source of pain) (Bilateral) (R>L), Chronic knee pain (Bilateral) (R>L), Chronic pain syndrome, and Long term current use of opiate analgesic were also pertinent to this visit.  Status Diagnosis  Controlled Controlled Controlled 1. Chronic shoulder pain (Location of Primary Source of Pain) (Bilateral) (L>R)   2. Chronic hip pain (Location of Secondary source of pain) (Bilateral) (R>L)   3. Chronic knee pain (Bilateral) (R>L)   4. Chronic pain syndrome   5. Long term current use of opiate analgesic     Problems updated and reviewed during this visit: Problem  Muscle Spasm   Last Assessment & Plan:  See note on involuntary muscle contractions.   Smoker   Last Assessment & Plan:  Has decreased smoking to <3 cigarettes per day.  Contemplating quitting.  Pt will consider CT lung cancer screening in the future.   Last Assessment & Plan:  He had quit in  the past, but restarted for weight control. Currently smokes ~2 cigarettes/day.  URGED him to quit smoking, especially given risk factors of afib, hypertension, diabetes, obesity, etc.  He will try to quit on his own, but we will consider medications at future OV if he is unable to do so.  I advised the patient of the risks in continuing to use tobacco, provided patient with smoking cessation educational materials as appropriate and discussed how to quit smoking and patient expressed the willingness to quit, x more than 3 min.  Last Assessment & Plan:  Pt has increased smoking to 5-6 cigarettes/day. Contemplative.  URGED smoking cessation as he has multiple risk factors for cardiovascular disease, including hypertension, diabetes, hyperlipidemia, etc. ASCVD risk 34.7%.   Last Assessment & Plan:  Smoking very little -1 pack lasts 1 wk, or more. Urged him to stop entirely, esp given his voicing concerns that he wants to  make sure he does not have a stroke (from his Afib)-discussed connection btwn smoking and inc'd CVA risk. Discussed different txt options such as nicotine patches, gum and/or rx medications. Would address this once again when pt is ready to quit.   I advised the patient of the risks in continuing to use tobacco, provided patient with smoking cessation educational materials as appropriate and discussed how to quit smoking and patient expressed the willingness to quit, x more than 3 min.   Type 2 Diabetes Mellitus ([facility])   Overview:  On statin/ARB. No ASA given warfarin use  Last Assessment & Plan:  Disease status: well controlled A1c goal: <7.0%; Pt is at goal. A1c is 6.5 today.  Medications:  Continue Metformin (Glucophage) 500 mg qam as prescribed.  Pt is on a statin  Pt cannot take daily ASA (on warfarin for CVA prophylaxis).  Urine microalbumin collected.  Near-fasting labs drawn as below.   Overview:  On statin/ARB. No ASA given warfarin use  Last Assessment & Plan:  Disease status: well controlled  A1c goal: <8.0%; Pt is at goal. A1c on 02/2017 was 6.0.     Pt had an eye exam performed at [facility] in [place] several months ago(~01/2017)-will have staff call to obtain records. Pt has gained ~10lbs since OV in 02/2017. Encouraged him to work on weight loss via healthy diet and regular exercise.  Plan to check fasted labs at next OV.    Pure Hypercholesterolemia   Last Assessment & Plan:  Formatting of this note may be different from the original. Tolerating simvastatin 40 mg qd well, continue this. Pt cannot take daily ASA (on warfarin for CVA prophylaxis).  Lab Results  Component Value Date   LDL 70 08/25/2016    Essential Hypertension   Last Assessment & Plan:  Good control on metoprolol, lasix, losartan despite wt gain-cont meds and urged wt loss.  Last Assessment & Plan:  Disease status: well controlled, denies sxs of hypotension. Seems overcontrolled w/low  BP today and low/nl recently. Given use of chronic opioids/Soma and age, need to avoid falls from hypotension if possible.  Medications:  Continue Metoprolol ER 64m (not 25 mg per med list) as prescribed, followed by cardiology.  Pt prescribe Losartan 541m but has only been taking 2558m STOP Losartan 49m36mdvice provided Pt included: recommended low salt diet, discussed componenents of a healthy diet  Recommended pt to take home BP's for close monitoring of LBP. If numbers begin to increase, will discuss possibility of restarting Losartan 49mg28m Hypertension, Benign   Last  Assessment & Plan:  Disease status: well controlled Medications:  Continue furosemide 20 mg qd, losartan 50 mg qd, and metoprolol 25 mg qd as prescribed. Advice provided Pt included: advised smoking cessation  Last Assessment & Plan:  Disease status: well controlled Medications:  Continue furosemide 20 mg qd, losartan 50 mg qd, and metoprolol 25 mg qd as prescribed. Advice provided Pt included: advised smoking cessation  Losartan and metoprolol refilled.   Chronic Wound of Head   Last Assessment & Plan:  Pt reports head injury 44moago-he bumped top of his head on a wooden bench-removed a splinter of wood from wound later that day. There is a 1cm raised horny papule with slight erythema on exam. No sign of infection or drainage. Given persistence of lesion, some concern for retained foreign body vs skin ca -Referral sent to derm for further eval   Hematoma of Leg, Left, Initial Encounter   Last Assessment & Plan:  Recent L medial shin injury w/current large hematoma. Reassured pt this is common w/ Xarelto 27mmanaged by cardiologist. No suspicion of fx.  Discussed with pt that sxs may take a few months to resolve    Need for Influenza Vaccination   Last Assessment & Plan:  Administered today.   Involuntary Muscle Contractions   Last Assessment & Plan:  Pt tolerating Soma 35054mell, continued this. 3  30d rxs escribed (90d total).  Advised pt not to mix rx with ETOH or other medications that promote drowsiness. Drug screen utd (09/2016).Contract updated on 03/31/2017. PDMP showed he last filled his Soma on 06/02/2017 and showed appropriate use. He conts to be prescribed 5mg94mycodone from Dr. NaveHarl Favor of [facility], they are apparently gradually tapering down his chronic opioid-encouraged pt regarding this.     Plan of Care  Pharmacotherapy (Medications Ordered): Meds ordered this encounter  Medications  . oxyCODONE (OXY IR/ROXICODONE) 5 MG immediate release tablet    Sig: Take 1 tablet (5 mg total) by mouth every 6 (six) hours as needed for severe pain.    Dispense:  120 tablet    Refill:  0    Do not place this medication, or any other prescription from our practice, on "Automatic Refill". Patient may have prescription filled one day early if pharmacy is closed on scheduled refill date. Do not fill until:10/16/2017 To last until: 11/15/2017    Order Specific Question:   Supervising Provider    Answer:   NAVEMilinda [phone] (OXY IR/ROXICODONE) 5 MG immediate release tablet    Sig: Take 1 tablet (5 mg total) by mouth every 6 (six) hours as needed for severe pain.    Dispense:  120 tablet    Refill:  0    Do not place this medication, or any other prescription from our practice, on "Automatic Refill". Patient may have prescription filled one day early if pharmacy is closed on scheduled refill date. Do not fill until: 09/16/2017 To last until:10/16/2017    Order Specific Question:   Supervising Provider    Answer:   NAVEMilinda [phone] (OXY IR/ROXICODONE) 5 MG immediate release tablet    Sig: Take 1 tablet (5 mg total) by mouth every 6 (six) hours as needed for severe pain.    Dispense:  120 tablet    Refill:  0    Do not place this medication, or any other prescription from our practice, on "Automatic Refill". Patient may have  prescription filled one day  early if pharmacy is closed on scheduled refill date. Do not fill until:08/17/2017 To last until:09/16/2017    Order Specific Question:   Supervising Provider    Answer:   Milinda Pointer [phone]   New Prescriptions   No medications on file   Medications administered today: Viola Placeres had no medications administered during this visit. Lab-work, procedure(s), and/or referral(s): Orders Placed This Encounter  Procedures  . ToxASSURE Select 13 (MW), Urine   Imaging and/or referral(s): None  Interventional therapies: Planned, scheduled, and/or pending:  None at this time    Considering:  (Stop Coumadin for 5 days prior to procedure) Left sided suprascapular nerve radiofrequency ablation    Palliative PRN treatment(s):  (Stop Coumadin for 5 days prior to procedure) Suprascapular nerve block  Possible suprascapular nerve radiofrequency ablation      Provider-requested follow-up: Return in about 3 months (around 11/15/2017) for MedMgmt with Me Donella Stade Edison Pace).  Future Appointments  Date Time Provider [place]  11/08/2017  9:15 AM Vevelyn Francois, NP [facility] None   Primary Care Physician: Neomia Dear, MD Location: [facility] Note by: Vevelyn Francois NP Date: 08/15/2017; Time: 9:38 AM  Pain Score Disclaimer: We use the NRS-11 scale. This is a self-reported, subjective measurement of pain severity with only modest accuracy. It is used primarily to identify changes within a particular patient. It must be understood that outpatient pain scales are significantly less accurate that those used for research, where they can be applied under ideal controlled circumstances with minimal exposure to variables. In reality, the score is likely to be a combination of pain intensity and pain affect, where pain affect describes the degree of emotional arousal or changes in action readiness caused by the  sensory experience of pain. Factors such as social and work situation, setting, emotional state, anxiety levels, expectation, and prior pain experience may influence pain perception and show large inter-individual differences that may also be affected by time variables.  Patient instructions provided during this appointment: Patient Instructions   You have been given 3 Rx for Oxycodone to last until 6/20/2019____________________________________________________________________________________________  Medication Rules  Applies to: All patients receiving prescriptions (written or electronic).  Pharmacy of record: Pharmacy where electronic prescriptions will be sent. If written prescriptions are taken to a different pharmacy, please inform the nursing staff. The pharmacy listed in the electronic medical record should be the one where you would like electronic prescriptions to be sent.  Prescription refills: Only during scheduled appointments. Applies to both, written and electronic prescriptions.  NOTE: The following applies primarily to controlled substances (Opioid* Pain Medications).   Patient's responsibilities: 1. Pain Pills: Bring all pain pills to every appointment (except for procedure appointments). 2. Pill Bottles: Bring pills in original pharmacy bottle. Always bring newest bottle. Bring bottle, even if empty. 3. Medication refills: You are responsible for knowing and keeping track of what medications you need refilled. The day before your appointment, write a list of all prescriptions that need to be refilled. Bring that list to your appointment and give it to the admitting nurse. Prescriptions will be written only during appointments. If you forget a medication, it will not be "Called in", "Faxed", or "electronically sent". You will need to get another appointment to get these prescribed. 4. Prescription Accuracy: You are responsible for carefully inspecting your prescriptions before  leaving our office. Have the discharge nurse carefully go over each prescription with you, before taking them home. Make sure that your name is accurately spelled,  that your address is correct. Check the name and dose of your medication to make sure it is accurate. Check the number of pills, and the written instructions to make sure they are clear and accurate. Make sure that you are given enough medication to last until your next medication refill appointment. 5. Taking Medication: Take medication as prescribed. Never take more pills than instructed. Never take medication more frequently than prescribed. Taking less pills or less frequently is permitted and encouraged, when it comes to controlled substances (written prescriptions).  6. Inform other Doctors: Always inform, all of your healthcare providers, of all the medications you take. 7. Pain Medication from other Providers: You are not allowed to accept any additional pain medication from any other Doctor or Healthcare provider. There are two exceptions to this rule. (see below) In the event that you require additional pain medication, you are responsible for notifying us, as stated below. 8. Medication Agreement: You are responsible for carefully reading and following our Medication Agreement. This must be signed before receiving any prescriptions from our practice. Safely store a copy of your signed Agreement. Violations to the Agreement will result in no further prescriptions. (Additional copies of our Medication Agreement are available upon request.) 9. Laws, Rules, & Regulations: All patients are expected to follow all Federal and [employer], Rules, [employer]. Ignorance of the Laws does not constitute a valid excuse. The use of any illegal substances is prohibited. 10. Adopted CDC guidelines & recommendations: Target dosing levels will be at or below 60 MME/day. Use of benzodiazepines** is not recommended.  Exceptions: There are only  two exceptions to the rule of not receiving pain medications from other Healthcare Providers. 1. Exception #1 (Emergencies): In the event of an emergency (i.e.: accident requiring emergency care), you are allowed to receive additional pain medication. However, you are responsible for: As soon as you are able, call our office [phone], at any time of the day or night, and leave a message stating your name, the date and nature of the emergency, and the name and dose of the medication prescribed. In the event that your call is answered by a member of our staff, make sure to document and save the date, time, and the name of the person that took your information.  2. Exception #2 (Planned Surgery): In the event that you are scheduled by another doctor or dentist to have any type of surgery or procedure, you are allowed (for a period no longer than 30 days), to receive additional pain medication, for the acute post-op pain. However, in this case, you are responsible for picking up a copy of our "Post-op Pain Management for Surgeons" handout, and giving it to your surgeon or dentist. This document is available at our office, and does not require an appointment to obtain it. Simply go to our office during business hours (Monday-Thursday from 8:00 AM to 4:00 PM) (Friday 8:00 AM to 12:00 Noon) or if you have a scheduled appointment with [place], prior to your surgery, and ask for it by name. In addition, you will need to provide [place] with your name, name of your surgeon, type of surgery, and date of procedure or surgery.  *Opioid medications include: morphine, codeine, oxycodone, oxymorphone, hydrocodone, hydromorphone, meperidine, tramadol, tapentadol, buprenorphine, fentanyl, methadone. **Benzodiazepine medications include: diazepam (Valium), alprazolam (Xanax), clonazepam (Klonopine), lorazepam (Ativan), clorazepate (Tranxene), chlordiazepoxide (Librium), estazolam (Prosom), oxazepam (Serax), temazepam (Restoril),  triazolam (Halcion) (Last updated: 07/26/2017) ____________________________________________________________________________________________   BMI Assessment: Estimated body mass  index is 34.97 kg/m as calculated from the following:   Height as of this encounter: _0  (1.727 m).   Weight as of this encounter: 230 lb (104.3 kg).  BMI interpretation table: BMI level Category Range association with higher incidence of chronic pain  <18 kg/m2 Underweight   18.5-24.9 kg/m2 Ideal body weight   25-29.9 kg/m2 Overweight Increased incidence by 20%  30-34.9 kg/m2 Obese (Class I) Increased incidence by 68%  35-39.9 kg/m2 Severe obesity (Class II) Increased incidence by 136%  >40 kg/m2 Extreme obesity (Class III) Increased incidence by 254%   BMI Readings from Last 4 Encounters:  08/15/17 34.97 kg/m  05/15/17 34.67 kg/m  02/13/17 33.75 kg/m  12/21/16 34.67 kg/m   Wt Readings from Last 4 Encounters:  08/15/17 230 lb (104.3 kg)  05/15/17 228 lb (103.4 kg)  02/13/17 222 lb (100.7 kg)  12/21/16 228 lb (103.4 kg)

## 2017-08-20 LAB — TOXASSURE SELECT 13 (MW), URINE

## 2017-08-27 DIAGNOSIS — I10 Essential (primary) hypertension: Secondary | ICD-10-CM | POA: Diagnosis not present

## 2017-08-27 DIAGNOSIS — R7301 Impaired fasting glucose: Secondary | ICD-10-CM | POA: Diagnosis not present

## 2017-08-27 DIAGNOSIS — E119 Type 2 diabetes mellitus without complications: Secondary | ICD-10-CM | POA: Diagnosis not present

## 2017-08-27 DIAGNOSIS — M6240 Contracture of muscle, unspecified site: Secondary | ICD-10-CM | POA: Diagnosis not present

## 2017-08-27 DIAGNOSIS — E782 Mixed hyperlipidemia: Secondary | ICD-10-CM | POA: Diagnosis not present

## 2017-08-27 DIAGNOSIS — F17200 Nicotine dependence, unspecified, uncomplicated: Secondary | ICD-10-CM | POA: Diagnosis not present

## 2017-08-27 DIAGNOSIS — I4891 Unspecified atrial fibrillation: Secondary | ICD-10-CM | POA: Diagnosis not present

## 2017-08-28 DIAGNOSIS — L988 Other specified disorders of the skin and subcutaneous tissue: Secondary | ICD-10-CM | POA: Diagnosis not present

## 2017-08-28 DIAGNOSIS — L578 Other skin changes due to chronic exposure to nonionizing radiation: Secondary | ICD-10-CM | POA: Diagnosis not present

## 2017-08-28 DIAGNOSIS — C4442 Squamous cell carcinoma of skin of scalp and neck: Secondary | ICD-10-CM | POA: Diagnosis not present

## 2017-08-28 DIAGNOSIS — L814 Other melanin hyperpigmentation: Secondary | ICD-10-CM | POA: Diagnosis not present

## 2017-09-27 DIAGNOSIS — Z79899 Other long term (current) drug therapy: Secondary | ICD-10-CM | POA: Diagnosis not present

## 2017-09-27 DIAGNOSIS — M62838 Other muscle spasm: Secondary | ICD-10-CM | POA: Diagnosis not present

## 2017-09-27 DIAGNOSIS — M6240 Contracture of muscle, unspecified site: Secondary | ICD-10-CM | POA: Diagnosis not present

## 2017-10-14 ENCOUNTER — Ambulatory Visit
Admission: EM | Admit: 2017-10-14 | Discharge: 2017-10-14 | Disposition: A | Discharge status: Home / Self Care | Payer: Medicare Other | Attending: Family Medicine | Admitting: Family Medicine

## 2017-10-14 ENCOUNTER — Other Ambulatory Visit: Payer: Self-pay

## 2017-10-14 DIAGNOSIS — M545 Low back pain: Secondary | ICD-10-CM | POA: Diagnosis not present

## 2017-10-14 DIAGNOSIS — G8929 Other chronic pain: Secondary | ICD-10-CM

## 2017-10-14 DIAGNOSIS — G894 Chronic pain syndrome: Secondary | ICD-10-CM

## 2017-10-14 MED ORDER — OXYCODONE HCL 5 MG PO TABS: 5.0000 mg | ORAL_TABLET | Freq: Four times a day (QID) | ORAL | 0 refills | Status: DC | PRN

## 2017-10-14 NOTE — ED Triage Notes (Signed)
Pt was lifting about 40 lbs of brass 2 nights ago and pulled his low back. Pain 10/10

## 2017-10-14 NOTE — ED Provider Notes (Signed)
[facility]    CSN: [id number] Arrival date & time: 10/14/17  1130  History   Chief Complaint Chief Complaint  Patient presents with  . Back Pain   HPI  72 year old male with an extensive past medical history including chronic pain managed by pain management presents with low back pain.  Patient has chronic back pain.  He takes oxycodone regularly.  He is not due for refill until 5/21.  [place] controlled substance database was reviewed today.  Patient lifted a large amount of brass 2 nights ago and strained his low back.  He is complaining of severe pain.  Worse with activity.  Radiates outward.  No relieving factors.  Patient states that he is out of his pain medication which she is not due for refill until 5/21.  No reports of urinary symptoms.  No other associated symptoms.  No other complaints.  Past Medical History:  Diagnosis Date  . Arthralgia of lower leg 04/02/2012  . Arthralgia of upper arm 06/18/2009  . Cancer ([place])   . Diabetes mellitus without complication ([place])   . FH: atrial fibrillation   . Hypertension     Patient Active Problem List   Diagnosis Date Noted  . Chronic wound of head 07/02/2017  . Hematoma of leg, left, initial encounter 03/30/2017  . Need for influenza vaccination 02/26/2017  . Chronic acromioclavicular joint pain (Left) 09/05/2016  . 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 Acromioclavicular arthrosis (Bilateral) (L>R) 09/05/2016  . Arthralgia of acromioclavicular joint (Bilateral) (L>R) 09/05/2016  . Obesity, Class I, BMI 30-34.9 05/11/2016  . Chronic pain syndrome 05/11/2016  . FH: atrial fibrillation   . Smoker 01/07/2016  . Disturbance of skin sensation 11/17/2015  . Lumbar facet syndrome (Bilateral) (R>L) 08/16/2015  . Lumbar spondylosis 08/16/2015  . Continuous opioid dependence ([place]) 07/05/2015  . Long term current use of opiate analgesic 05/18/2015  . Long term prescription opiate use 05/18/2015  .  Opiate use (97.5 MME/Day) 05/18/2015  . Encounter for therapeutic drug level monitoring 05/18/2015  . Encounter for chronic pain management 05/18/2015  . Opioid dependence, daily use ([place]) 05/18/2015  . Chronic hip pain (Location of Secondary source of pain) (Bilateral) (R>L) 05/18/2015  . Chronic shoulder pain (Location of Primary Source of Pain) (Bilateral) (L>R) 05/18/2015  . Chronic low back pain (Location of Tertiary source of pain) (Bilateral) (R>L) 05/18/2015  . Chronic knee pain (Bilateral) (R>L) 05/18/2015  . S/P THR: total hip replacement (Bilateral) 05/18/2015  . S/P TKR: total knee replacement (Bilateral) 05/18/2015  . Type 2 diabetes mellitus ([place]) 05/18/2015  . History of bladder cancer 05/18/2015  . History of hip fracture 05/18/2015  . History of pelvic fracture 05/18/2015  . History of anticoagulant therapy 05/18/2015  . Chronic neck pain 05/18/2015  . Chondrocalcinosis 05/18/2015  . Chronic shoulder pain ([place]) 05/18/2015  . History of shoulder surgery 05/18/2015  . Myofascial pain 05/18/2015  . Musculoskeletal pain 05/18/2015  . Muscle spasm 05/18/2015  . Pure hypercholesterolemia 12/31/2014  . Essential hypertension 12/31/2014  . Muscle spasticity 07/30/2014  . Involuntary muscle contractions 07/30/2014  . Malignant neoplasm of urinary bladder ([place]) 03/26/2014  . Malignant neoplasm of bladder ([place]) 10/23/2013  . Malignant neoplasm of overlapping sites of bladder ([place]) 10/23/2013  . Atrial fibrillation ([place]) 01/14/2013  . Coumadin anticoagulation 01/14/2013  . Combined fat and carbohydrate induced hyperlipemia 06/18/2009  . Morbid obesity ([place]) 06/18/2009  . Hypertension, benign 06/18/2009  . Impaired fasting glucose 06/18/2009    Past Surgical  History:  Procedure Laterality Date  . bladder cancer surgery  2015  . JOINT REPLACEMENT    . REPLACEMENT TOTAL HIP W/  RESURFACING IMPLANTS Bilateral   . SHOULDER SURGERY Bilateral   . TOTAL KNEE ARTHROPLASTY  Bilateral     Home Medications    Prior to Admission medications   Medication Sig Start Date End Date Taking? Authorizing Provider  carisoprodol (SOMA) 350 MG tablet Take 350 mg by mouth 2 (two) times daily.     [provider]  Cetirizine HCl (ZYRTEC ALLERGY) 10 MG CAPS Take 1 daily for itching 05/21/17   Crecencio Mc P, PA-C  digoxin (DIGOX) 0.125 MG tablet TAKE 1 TABLET BY MOUTH EVERY DAY 11/25/15   [provider]  furosemide (LASIX) 20 MG tablet TAKE 1 TABLET(20 MG) BY MOUTH DAILY AS NEEDED FOR SWELLING 01/08/17 07/07/17  [provider]  Melatonin 5 MG TABS Take 5 mg by mouth daily as needed.     [provider]  metoprolol succinate (TOPROL-XL) 25 MG 24 hr tablet Take 25 mg by mouth. 02/10/16 02/09/17  [provider]  metoprolol succinate (TOPROL-XL) 25 MG 24 hr tablet Take 25 mg by mouth. 02/10/16   [provider]  Naloxone HCl (NARCAN) 4 MG/0.1ML LIQD Place 1 spray into the nose once. Spray half of bottle content into each nostril, then call 911 08/16/15   Milinda Pointer, MD  oxyCODONE (OXY IR/ROXICODONE) 5 MG immediate release tablet Take 1 tablet (5 mg total) by mouth every 6 (six) hours as needed for severe pain. 09/16/17 10/16/17  Vevelyn Francois, NP  oxyCODONE (OXY IR/ROXICODONE) 5 MG immediate release tablet Take 1 tablet (5 mg total) by mouth every 6 (six) hours as needed for severe pain. 08/17/17 09/16/17  Vevelyn Francois, NP  oxyCODONE (OXY IR/ROXICODONE) 5 MG immediate release tablet Take 1 tablet (5 mg total) by mouth every 6 (six) hours as needed for severe pain. 10/14/17   Coral Spikes, DO  rivaroxaban (XARELTO) 20 MG TABS tablet Take 20 mg by mouth. 03/26/17   [provider]  rosuvastatin (CRESTOR) 20 MG tablet Take 20 mg by mouth daily.  03/22/16 05/21/17  [provider]  simvastatin (ZOCOR) 40 MG tablet Take 40 mg by mouth at bedtime.  02/16/14   [provider]  triamcinolone cream (KENALOG)  0.1 % Apply 1 application topically 2 (two) times daily. 05/21/17   Lorin Picket, PA-C    Family History Family History  Problem Relation Age of Onset  . Dementia Mother   . Heart disease Father     Social History Social History   Tobacco Use  . Smoking status: Current Some Day Smoker    Packs/day: 0.50    Types: Cigarettes  . Smokeless tobacco: Never Used  Substance Use Topics  . Alcohol use: No    Alcohol/week: 0.0 oz  . Drug use: No     Allergies   Diltiazem; Penicillins; and Tizanidine   Review of Systems Review of Systems  Constitutional: Negative.   Musculoskeletal: Positive for back pain.   Physical Exam Triage Vital Signs ED Triage Vitals [10/14/17 1137]  Enc Vitals Group     BP 139/69     Pulse Rate 61     Resp 20     Temp 97.7 F (36.5 C)     Temp Source Oral     SpO2 97 %     Weight      Height  Head Circumference      Peak Flow      Pain Score 10     Pain Loc      Pain Edu?      Excl. in [place]?    Updated Vital Signs BP 139/69 (BP Location: Left Arm)   Pulse 61   Temp 97.7 F (36.5 C) (Oral)   Resp 20   SpO2 97%     Physical Exam  Constitutional: He is oriented to person, place, and time. He appears well-developed.  In distress secondary to pain.  Cardiovascular: Normal rate and regular rhythm.  Pulmonary/Chest: Effort normal. No respiratory distress.  Musculoskeletal:  Lumbar spine with exquisite tenderness to palpation of the paraspinal musculature.  Patient had difficulty sitting up for exam.   Neurological: He is alert and oriented to person, place, and time.  Psychiatric: He has a normal mood and affect. His behavior is normal.  Nursing note and vitals reviewed.  UC Treatments / Results  Labs (all labs ordered are listed, but only abnormal results are displayed) Labs Reviewed - No data to display  EKG None  Radiology No results found.  Procedures Procedures (including critical care time)  Medications Ordered  in UC Medications - No data to display  Initial Impression / Assessment and Plan / UC Course  I have reviewed the triage vital signs and the nursing notes.  Pertinent labs & imaging results that were available during my care of the patient were reviewed by me and considered in my medical decision making (see chart for details).    72 year old male presents with acute on chronic low back pain.  Patient's wife demanded x-ray.  No need for evaluation with x-ray given that this is likely muscular in origin.  I informed her that this is not likely to change my management.  Patient was given a very limited supply pain medication.  I informed him that this could possibly violate his pain contract.  Patient informed me that not in an emergency situation.  Patient needs to contact his pain management physician.  Patient has a controlled substance muscle relaxer at home, SOMA.  Given this, a muscle relaxer was not prescribed.  He has additional medical problems which prevent me from prescribing NSAIDs.  Final Clinical Impressions(s) / UC Diagnoses   Final diagnoses:  Bilateral low back pain, unspecified chronicity, with sciatica presence unspecified     Discharge Instructions     Pain medication as directed.  Continue the muscle relaxer.  Notify your pain physician tomorrow.  Take care  Dr. Lacinda Axon    ED Prescriptions    Medication Sig Dispense Auth. Provider   oxyCODONE (OXY IR/ROXICODONE) 5 MG immediate release tablet  (Status: Discontinued) Take 1 tablet (5 mg total) by mouth every 6 (six) hours as needed for severe pain. 4 tablet Jaylani Mcguinn G, DO   oxyCODONE (OXY IR/ROXICODONE) 5 MG immediate release tablet Take 1 tablet (5 mg total) by mouth every 6 (six) hours as needed for severe pain. 4 tablet Coral Spikes, DO     Controlled Substance Prescriptions [employer] Controlled Substance Registry consulted? Yes.  I reviewed the [place] controlled substance database.  Per the database, he is not due for  refill until 5/21.  This is also reflected in the chart.  I informed the patient that giving him pain medication may violate his pain contract and patient states that not if it is an emergency situation.  I gave him an Rx for Oxycodone 5 mg (#4).  This is his usual dose of pain medication.  He needs to notify his pain management physician.   Coral Spikes, [place] 10/14/17 1251

## 2017-10-14 NOTE — Discharge Instructions (Signed)
Pain medication as directed.  Continue the muscle relaxer.  Notify your pain physician tomorrow.  Take care  Dr. Lacinda Axon

## 2017-10-15 DIAGNOSIS — C679 Malignant neoplasm of bladder, unspecified: Secondary | ICD-10-CM | POA: Diagnosis not present

## 2017-10-15 DIAGNOSIS — F17200 Nicotine dependence, unspecified, uncomplicated: Secondary | ICD-10-CM | POA: Diagnosis not present

## 2017-10-15 DIAGNOSIS — S39012A Strain of muscle, fascia and tendon of lower back, initial encounter: Secondary | ICD-10-CM | POA: Diagnosis not present

## 2017-10-17 ENCOUNTER — Telehealth: Payer: Self-pay | Admitting: Pain Medicine

## 2017-10-17 NOTE — Telephone Encounter (Signed)
Thanks

## 2017-10-17 NOTE — Telephone Encounter (Signed)
Patient had to go to [facility]. They gave him 4  Oxycodone 5 mg. Wanted to let us know

## 2017-11-08 ENCOUNTER — Other Ambulatory Visit: Payer: Self-pay

## 2017-11-08 ENCOUNTER — Ambulatory Visit
Admission: RE | Admit: 2017-11-08 | Discharge: 2017-11-08 | Disposition: A | Discharge status: Home / Self Care | Payer: Medicare Other | Source: Ambulatory Visit | Attending: Nurse Practitioner | Admitting: Nurse Practitioner

## 2017-11-08 ENCOUNTER — Encounter: Payer: Self-pay | Admitting: Nurse Practitioner

## 2017-11-08 ENCOUNTER — Ambulatory Visit: Payer: Medicare Other | Attending: Nurse Practitioner | Admitting: Nurse Practitioner

## 2017-11-08 VITALS — BP 140/73 | HR 77 | Temp 97.7°F | Resp 18 | Ht 68.0 in | Wt 228.0 lb

## 2017-11-08 DIAGNOSIS — G8929 Other chronic pain: Secondary | ICD-10-CM | POA: Insufficient documentation

## 2017-11-08 DIAGNOSIS — Z79899 Other long term (current) drug therapy: Secondary | ICD-10-CM | POA: Diagnosis not present

## 2017-11-08 DIAGNOSIS — I10 Essential (primary) hypertension: Secondary | ICD-10-CM | POA: Diagnosis not present

## 2017-11-08 DIAGNOSIS — M1120 Other chondrocalcinosis, unspecified site: Secondary | ICD-10-CM | POA: Insufficient documentation

## 2017-11-08 DIAGNOSIS — S8012XA Contusion of left lower leg, initial encounter: Secondary | ICD-10-CM | POA: Diagnosis not present

## 2017-11-08 DIAGNOSIS — F17210 Nicotine dependence, cigarettes, uncomplicated: Secondary | ICD-10-CM | POA: Diagnosis not present

## 2017-11-08 DIAGNOSIS — Z79891 Long term (current) use of opiate analgesic: Secondary | ICD-10-CM | POA: Diagnosis not present

## 2017-11-08 DIAGNOSIS — E119 Type 2 diabetes mellitus without complications: Secondary | ICD-10-CM | POA: Insufficient documentation

## 2017-11-08 DIAGNOSIS — M7918 Myalgia, other site: Secondary | ICD-10-CM | POA: Diagnosis not present

## 2017-11-08 DIAGNOSIS — M47816 Spondylosis without myelopathy or radiculopathy, lumbar region: Secondary | ICD-10-CM

## 2017-11-08 DIAGNOSIS — M5136 Other intervertebral disc degeneration, lumbar region: Secondary | ICD-10-CM | POA: Diagnosis not present

## 2017-11-08 DIAGNOSIS — Z23 Encounter for immunization: Secondary | ICD-10-CM | POA: Insufficient documentation

## 2017-11-08 DIAGNOSIS — M25551 Pain in right hip: Secondary | ICD-10-CM | POA: Insufficient documentation

## 2017-11-08 DIAGNOSIS — M19011 Primary osteoarthritis, right shoulder: Secondary | ICD-10-CM | POA: Diagnosis not present

## 2017-11-08 DIAGNOSIS — M25511 Pain in right shoulder: Secondary | ICD-10-CM | POA: Diagnosis not present

## 2017-11-08 DIAGNOSIS — Z7901 Long term (current) use of anticoagulants: Secondary | ICD-10-CM | POA: Diagnosis not present

## 2017-11-08 DIAGNOSIS — G894 Chronic pain syndrome: Secondary | ICD-10-CM | POA: Diagnosis not present

## 2017-11-08 DIAGNOSIS — Z880 Allergy status to penicillin: Secondary | ICD-10-CM | POA: Insufficient documentation

## 2017-11-08 DIAGNOSIS — M542 Cervicalgia: Secondary | ICD-10-CM | POA: Insufficient documentation

## 2017-11-08 DIAGNOSIS — E782 Mixed hyperlipidemia: Secondary | ICD-10-CM | POA: Diagnosis not present

## 2017-11-08 DIAGNOSIS — Z96653 Presence of artificial knee joint, bilateral: Secondary | ICD-10-CM | POA: Diagnosis not present

## 2017-11-08 DIAGNOSIS — F1120 Opioid dependence, uncomplicated: Secondary | ICD-10-CM | POA: Insufficient documentation

## 2017-11-08 DIAGNOSIS — M25552 Pain in left hip: Secondary | ICD-10-CM | POA: Diagnosis not present

## 2017-11-08 DIAGNOSIS — M545 Low back pain: Secondary | ICD-10-CM | POA: Diagnosis present

## 2017-11-08 DIAGNOSIS — M25512 Pain in left shoulder: Secondary | ICD-10-CM | POA: Insufficient documentation

## 2017-11-08 DIAGNOSIS — I4891 Unspecified atrial fibrillation: Secondary | ICD-10-CM | POA: Insufficient documentation

## 2017-11-08 DIAGNOSIS — M19012 Primary osteoarthritis, left shoulder: Secondary | ICD-10-CM | POA: Diagnosis not present

## 2017-11-08 NOTE — Progress Notes (Addendum)
Short's Name: Aaron Short  MRN: [id number]  Referring Provider: Neomia Dear, MD  DOB: 06-May-1946  PCP: Neomia Dear, MD  DOS: 11/08/2017  Note by: Vevelyn Francois NP  Service setting: Ambulatory outpatient  Specialty: Interventional Pain Management  Location: [facility] (AMB) Pain Management Facility    Short type: Established    Primary Reason(s) for Visit: Encounter for prescription drug management. (Level of risk: moderate)  CC: Back Pain (low left is worse) and Shoulder Pain (left)  HPI  Aaron Short, who comes today for a medication management evaluation. He has Long term current use of opiate analgesic; Long term prescription opiate use; Opiate use (97.5 MME/Day); Encounter for therapeutic drug level monitoring; Encounter for chronic pain management; Opioid dependence, daily use ([place]); Chronic hip pain (Location of Secondary source of pain) (Bilateral) (R>L); Chronic shoulder pain (Location of Primary Source of Pain) (Bilateral) (L>R); Chronic low back pain (Location of Tertiary source of pain) (Bilateral) (R>L); Chronic knee pain (Bilateral) (R>L); S/P THR: total hip replacement (Bilateral); S/P TKR: total knee replacement (Bilateral); Atrial fibrillation and flutter ([place]); Malignant neoplasm of urinary bladder ([place]); Type 2 diabetes mellitus ([place]); Muscle spasticity; Coumadin anticoagulation; Mixed hyperlipidemia; Pure hypercholesterolemia; History of bladder cancer; History of hip fracture; History of pelvic fracture; Long-term use of high-risk medication; Chronic neck pain; Chondrocalcinosis; Chronic shoulder pain ([place]); History of shoulder surgery; Myofascial pain; Musculoskeletal pain; Muscle spasm; Lumbar facet syndrome (Bilateral) (R>L); Lumbar spondylosis; Morbid obesity ([place]); Continuous opioid dependence ([place]); Disturbance of skin sensation; Malignant neoplasm of bladder ([place]); Smoker; FH: atrial fibrillation; Malignant neoplasm  of overlapping sites of bladder ([place]); Obesity, Class I, BMI 30-34.9; Chronic pain syndrome; Hypertension, benign; Impaired fasting glucose; Chronic acromioclavicular joint pain (Left); Osteoarthritis of shoulder (Bilateral) (L>R); 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 His primarily concern today is the Back Pain (low left is worse) and Shoulder Pain (left)  Pain Assessment: Location: Lower, Right, Left(worse on the left) Back Radiating:   Onset: More than a month ago(lifted a bucket(about 50 lbs) and felt a popping noise about a moth ago.) Duration: Chronic pain Quality: Constant, Sharp Severity: 5 /10 (subjective, self-reported pain score)  Note: Reported level is compatible with observation.                          Timing: Constant Modifying factors: steriods PO, ice heat,  BP: 140/73  HR: 77  Aaron Short was last scheduled for an appointment on 08/15/2017 for medication management. During today's appointment we reviewed Aaron Short chronic pain status, as well as his outpatient medication regimen. He states that he is hurt his back about 3 weeks ago while lifting an bucket of brass. He was seen at Urgent care given Oxycodone (4 tabs) and PCP and given medrol Dosepak which was effective with ice and heat. He did not have any images done. He admits that he continues to have pain and is unable to straighten his leg while sleeping. He feels like the left is worse than the right. He denies any leg pain now. He did have some pain initially. He admits that he had similar symptoms years ago. He is having more urinary frequency but voiding only small amounts. He also feels like this is the same with  his bowels.   The Short   reports that he does not use drugs. His body mass index is 34.67 kg/m.  Further details on both, my assessment(s), as well as the proposed treatment plan, please see below.  Controlled Substance Pharmacotherapy Assessment REMS (Risk Evaluation and Mitigation Strategy)  Analgesic:Oxycodone IR 5 mg every 6hours (5 mg/dayof oxycodone)  MME:22.31mq per day SCherlyn Labella 11/08/2017  3:46 PM  Signed Nursing Pain Medication Assessment:  Safety precautions to be maintained throughout the outpatient stay will include: orient to surroundings, keep bed in low position, maintain call bell within reach at all times, provide assistance with transfer out of bed and ambulation.  Medication Inspection Compliance: Pill count conducted under aseptic conditions, in front of the Short. Neither the pills nor the bottle was removed from the Short's sight at any time. Once count was completed pills were immediately returned to the Short in their original bottle.  Medication: Oxycodone IR Pill/Patch Count: 26 of 120 pills remain Pill/Patch Appearance: Markings consistent with prescribed medication Bottle Appearance: Standard pharmacy container. Clearly labeled. Filled Date: 05 / 21 / 2019 Last Medication intake:  Today   Pharmacokinetics: Liberation and absorption (onset of action): WNL Distribution (time to peak effect): WNL Metabolism and excretion (duration of action): WNL         Pharmacodynamics: Desired effects: Analgesia: Aaron Short >50% benefit. Functional ability: Short reports that medication allows him to accomplish basic ADLs Clinically meaningful improvement in function (CMIF): Sustained CMIF goals met Perceived effectiveness: Described as relatively effective, allowing for increase in activities of daily living (ADL) Undesirable effects: Side-effects or Adverse reactions: None reported Monitoring: [place] PMP: Online review of the past 155-montheriod  conducted. Compliant with practice rules and regulations Last UDS on record: Summary  Date Value Ref Range Status  08/15/2017 FINAL  Final    Comment:    ==================================================================== TOXASSURE SELECT 13 (MW) ==================================================================== Test                             Result       Flag       Units Drug Present and Declared for Prescription Verification   Oxycodone                      238          EXPECTED   ng/mg creat   Oxymorphone                    841          EXPECTED   ng/mg creat   Noroxycodone                   672          EXPECTED   ng/mg creat   Noroxymorphone                 172          EXPECTED   ng/mg creat    Sources of oxycodone are scheduled prescription medications.    Oxymorphone, noroxycodone, and noroxymorphone are expected    metabolites of oxycodone. Oxymorphone is also available as a    scheduled prescription medication. Drug Present not Declared for Prescription Verification   Carboxy-THC                    104  UNEXPECTED ng/mg creat    Carboxy-THC is a metabolite of tetrahydrocannabinol  (THC).    Source of [facility] is most commonly illicit, but THC is also present    in a scheduled prescription medication. ==================================================================== Test                      Result    Flag   Units      Ref Range   Creatinine              157              mg/dL      >=20 ==================================================================== Declared Medications:  The flagging and interpretation on this report are based on the  following declared medications.  Unexpected results may arise from  inaccuracies in the declared medications.  **Note: The testing scope of this panel includes these medications:  Oxycodone  **Note: The testing scope of this panel does not include following  reported medications:  Carisoprodol  Cetirizine  Digoxin   Furosemide  Melatonin  Metoprolol  Naloxone  Rivaroxaban  Rosuvastatin  Simvastatin  Triamcinolone ==================================================================== For clinical consultation, please call [phone]. ====================================================================    UDS interpretation: Unexpected findings:          Medication Assessment Form: Discrepancies found between Short's report and information collected Treatment compliance: Non-compliant. Steps taken to remind the Short of the seriousness of adequate therapy compliance Risk Assessment Profile: Aberrant behavior: use of illicit substances Comorbid factors increasing risk of overdose: caucasian and male gender Risk of substance use disorder (SUD): High Opioid Risk Tool - 11/08/17 0850      Family History of Substance Abuse   Alcohol  Negative    Illegal Drugs  Negative    Rx Drugs  Negative      Personal History of Substance Abuse   Alcohol  Negative    Illegal Drugs  Negative    Rx Drugs  Negative      Age   Age between 41-45 years   No      History of Preadolescent Sexual Abuse   History of Preadolescent Sexual Abuse  Negative or Male      Psychological Disease   Psychological Disease  Negative    Depression  Negative      Total Score   Opioid Risk Tool Scoring  0    Opioid Risk Interpretation  Low Risk      ORT Scoring interpretation table:  Score <3 = Low Risk for SUD  Score between 4-7 = Moderate Risk for SUD  Score >8 = High Risk for Opioid Abuse   Risk Mitigation Strategies:  Short Counseling: Covered Short-Prescriber Agreement (PPA): Present and active  Notification to other healthcare providers: Done  Pharmacologic Plan: Pending ordered tests and/or consults.             Laboratory Chemistry  Inflammation Markers (CRP: Acute Phase) (ESR: Chronic Phase) Lab Results  Component Value Date   CRP <0.5 11/18/2015   ESRSEDRATE 6 11/18/2015                          Rheumatology Markers No results found for: RF, ANA, LABURIC, URICUR, LYMEIGGIGMAB, LYMEABIGMQN, HLAB27                      Renal Function Markers Lab Results  Component Value Date   BUN 15 11/18/2015   CREATININE 0.80 11/18/2015   GFRAA >  60 11/18/2015   GFRNONAA >60 11/18/2015                             Hepatic Function Markers Lab Results  Component Value Date   AST 19 11/18/2015   ALT 17 11/18/2015   ALBUMIN 4.1 11/18/2015   ALKPHOS 68 11/18/2015                        Electrolytes Lab Results  Component Value Date   NA 139 11/18/2015   K 4.3 11/18/2015   CL 103 11/18/2015   CALCIUM 9.3 11/18/2015   MG 1.8 11/18/2015                        Neuropathy Markers Lab Results  Component Value Date   VITAMINB12 268 11/18/2015                        Bone Pathology Markers Lab Results  Component Value Date   25OHVITD1 31 11/18/2015   25OHVITD2 <1.0 11/18/2015   25OHVITD3 30 11/18/2015                         Coagulation Parameters No results found for: INR, LABPROT, APTT, PLT, DDIMER                      Cardiovascular Markers No results found for: BNP, CKTOTAL, CKMB, TROPONINI, HGB, HCT                       CA Markers No results found for: CEA, CA125, LABCA2                      Note: Lab results reviewed.  Recent Diagnostic Imaging Results  DG Lumbar Spine Complete W/Bend CLINICAL DATA:  Low back pain for 1 month radiating down LEFT leg, injured back lifting something heavy, lumbar spondylosis, lumbar facet syndrome  EXAM: LUMBAR SPINE - COMPLETE WITH BENDING VIEWS  COMPARISON:  11/18/2015  FINDINGS: Osseous demineralization.  Five non-rib-bearing lumbar vertebra.  Multilevel endplate spur formation throughout lumbar spine.  Minimal disc space narrowing L4-L5.  Vertebral body heights maintained without fracture or bone destruction.  Minimal retrolisthesis at L5-S1 approximately 3 mm which increases to 6 mm with extension and appears  unchanged with flexion.  Remaining alignments normal it out abnormal motion with flexion or extension.  SI joints preserved.  BILATERAL hip prostheses.  IMPRESSION: Scattered degenerative disc disease changes of the lumbar spine.  Minimal retrolisthesis at L5-S1 which mildly increases with extension and is unchanged with flexion.  Electronically Signed   By: Lavonia Dana M.D.   On: 11/08/2017 11:02  Complexity Note: Imaging results reviewed. Results shared with Mr. Rafter, using Layman's terms.                         Meds   Current Outpatient Medications:  .  acetaminophen (TYLENOL) 500 MG tablet, Take 500 mg by mouth every 8 (eight) hours as needed., Disp: , Rfl:  .  carisoprodol (SOMA) 350 MG tablet, Take 350 mg by mouth 2 (two) times daily. , Disp: , Rfl:  .  Cetirizine HCl (ZYRTEC ALLERGY) 10 MG CAPS, Take 1 daily for itching, Disp: 30 capsule, Rfl: 0 .  digoxin (DIGOX) 0.125  MG tablet, TAKE 1 TABLET BY MOUTH EVERY DAY, Disp: , Rfl:  .  furosemide (LASIX) 20 MG tablet, TAKE 1 TABLET(20 MG) BY MOUTH DAILY AS NEEDED FOR SWELLING, Disp: , Rfl:  .  metoprolol succinate (TOPROL-XL) 25 MG 24 hr tablet, Take 25 mg by mouth., Disp: , Rfl:  .  Naloxone HCl (NARCAN) 4 MG/0.1ML LIQD, Place 1 spray into the nose once. Spray half of bottle content into each nostril, then call 911, Disp: 2 each, Rfl: 0 .  oxyCODONE (OXY IR/ROXICODONE) 5 MG immediate release tablet, Take 1 tablet (5 mg total) by mouth every 6 (six) hours as needed for severe pain., Disp: 4 tablet, Rfl: 0 .  rivaroxaban (XARELTO) 20 MG TABS tablet, Take 20 mg by mouth., Disp: , Rfl:  .  rosuvastatin (CRESTOR) 20 MG tablet, Take 20 mg by mouth daily. , Disp: , Rfl:  .  simvastatin (ZOCOR) 40 MG tablet, Take 40 mg by mouth at bedtime. , Disp: , Rfl:  .  Melatonin 5 MG TABS, Take 5 mg by mouth daily as needed. , Disp: , Rfl:  .  metoprolol succinate (TOPROL-XL) 25 MG 24 hr tablet, Take 25 mg by mouth., Disp: , Rfl:  .   oxyCODONE (OXY IR/ROXICODONE) 5 MG immediate release tablet, Take 1 tablet (5 mg total) by mouth every 6 (six) hours as needed for severe pain., Disp: 120 tablet, Rfl: 0 .  oxyCODONE (OXY IR/ROXICODONE) 5 MG immediate release tablet, Take 1 tablet (5 mg total) by mouth every 6 (six) hours as needed for severe pain., Disp: 120 tablet, Rfl: 0 .  triamcinolone cream (KENALOG) 0.1 %, Apply 1 application topically 2 (two) times daily. (Short not taking: Reported on 11/08/2017), Disp: 30 g, Rfl: 0  ROS  Constitutional: Denies any fever or chills Gastrointestinal: No reported hemesis, hematochezia, vomiting, or acute GI distress Musculoskeletal: Denies any acute onset joint swelling, redness, loss of ROM, or weakness Neurological: No reported episodes of acute onset apraxia, aphasia, dysarthria, agnosia, amnesia, paralysis, loss of coordination, or loss of consciousness  Allergies  Mr. Kuang is allergic to diltiazem; penicillins; and tizanidine.  PFSH  Drug: Mr. Madariaga  reports that he does not use drugs. Alcohol:  reports that he does not drink alcohol. Tobacco:  reports that he has been smoking cigarettes.  He has been smoking about 0.50 packs per day. He has never used smokeless tobacco. Medical:  has a past medical history of Arthralgia of lower leg (04/02/2012), Arthralgia of upper arm (06/18/2009), Cancer ([place]), Diabetes mellitus without complication ([place]), FH: atrial fibrillation, and Hypertension. Surgical: Mr. Voiles  has a past surgical history that includes bladder cancer surgery (2015); Joint replacement; Replacement total hip w/  resurfacing implants (Bilateral); Total knee arthroplasty (Bilateral); and Shoulder surgery (Bilateral). Family: family history includes Dementia in his mother; Heart disease in his father.  Constitutional Exam  General appearance: Well nourished, well developed, and well hydrated. In no apparent acute distress Vitals:   11/08/17 0839  BP:  140/73  Pulse: 77  Resp: 18  Temp: 97.7 F (36.5 C)  TempSrc: Axillary  SpO2: 99%  Weight: 228 lb (103.4 kg)  Height: _0  (1.727 m)  Psych/Mental status: Alert, oriented x 3 (person, place, & time)       Eyes: PERLA Respiratory: No evidence of acute respiratory distress  Lumbar Spine Area Exam  Skin & Axial Inspection: No masses, redness, or swelling Alignment: Symmetrical Functional ROM: Unrestricted ROM       Stability: No  instability detected Muscle Tone/Strength: Functionally intact. No obvious neuro-muscular anomalies detected. Sensory (Neurological): Unimpaired Palpation: Complains of area being tender to palpation       Provocative Tests: Lumbar Hyperextension/rotation test: (+)       Lumbar quadrant test (Kemp's test): deferred today       Lumbar Lateral bending test: (+)       Patrick's Maneuver: deferred today                   FABER test: deferred today       Thigh-thrust test: deferred today       S-I compression test: deferred today       S-I distraction test: deferred today        Gait & Posture Assessment  Ambulation: Unassisted Gait: Relatively normal for age and body habitus Posture: WNL   Lower Extremity Exam    Side: Right lower extremity  Side: Left lower extremity  Stability: No instability observed          Stability: No instability observed          Skin & Extremity Inspection: Skin color, temperature, and hair growth are WNL. No peripheral edema or cyanosis. No masses, redness, swelling, asymmetry, or associated skin lesions. No contractures.  Skin & Extremity Inspection: Skin color, temperature, and hair growth are WNL. No peripheral edema or cyanosis. No masses, redness, swelling, asymmetry, or associated skin lesions. No contractures.  Functional ROM: Unrestricted ROM                  Functional ROM: Unrestricted ROM                  Muscle Tone/Strength: Functionally intact. No obvious neuro-muscular anomalies detected.  Muscle Tone/Strength:  Functionally intact. No obvious neuro-muscular anomalies detected.  Sensory (Neurological): Unimpaired  Sensory (Neurological): Unimpaired  Palpation: No palpable anomalies  Palpation: No palpable anomalies   Assessment  Primary Diagnosis & Pertinent Problem List: The primary encounter diagnosis was Lumbar spondylosis. Diagnoses of Lumbar facet syndrome (Bilateral) (R>L), Osteoarthritis of shoulder (Bilateral) (L>R), and Long term prescription opiate use were also pertinent to this visit.  Status Diagnosis  Worsening Worsening Stable 1. Lumbar spondylosis   2. Lumbar facet syndrome (Bilateral) (R>L)   3. Osteoarthritis of shoulder (Bilateral) (L>R)   4. Long term prescription opiate use     Problems updated and reviewed during this visit: Problem  Malignant Neoplasm of Urinary Bladder ([facility]cc)   Last Assessment & Plan:  History:07/2013 Clinical Event-Other  Gross hematuria  09/08/2013 Radiology Study  CTU: bladder mass, bilateral UVJ filling defects, no nodes, no mets  10/15/2013 Surgery  TURBT + bilateral URS: Ta, HG, URS negative bilaterally  10/2013 - Antibody/Immunotherapy  BCG: 6 week induction, no maintenance due to LUTS  03/19/2014 Surgery  TURBT + bilateral RPG: Ta, LG, RPG negative   Undergoes routine surveillance cystoscopy w/[facility].  Stable.   Smoker   Last Assessment & Plan:  Has decreased smoking to <3 cigarettes per day.  Contemplating quitting.  Pt will consider CT lung cancer screening in the future.   Last Assessment & Plan:  He had quit in the past, but restarted for weight control. Currently smokes ~2 cigarettes/day.  URGED him to quit smoking, especially given risk factors of afib, hypertension, diabetes, obesity, etc.  He will try to quit on his own, but we will consider medications at future OV if he is unable to do so.  I advised the Short  of the risks in continuing to use tobacco, provided Short with smoking cessation educational materials  as appropriate and discussed how to quit smoking and Short expressed the willingness to quit, x more than 3 min.  Last Assessment & Plan:  Pt has increased smoking to 5-6 cigarettes/day. Contemplative.  URGED smoking cessation as he has multiple risk factors for cardiovascular disease, including hypertension, diabetes, hyperlipidemia, etc. ASCVD risk 34.7%.   Last Assessment & Plan:  Smoking very little -1 pack lasts 1 wk, or more. Urged him to stop entirely, esp given his voicing concerns that he wants to make sure he does not have a stroke (from his Afib)-discussed connection btwn smoking and inc'd CVA risk. Discussed different txt options such as nicotine patches, gum and/or rx medications. Would address this once again when pt is ready to quit.   I advised the Short of the risks in continuing to use tobacco, provided Short with smoking cessation educational materials as appropriate and discussed how to quit smoking and Short expressed the willingness to quit, x more than 3 min.  Last Assessment & Plan:  Smoking very little -1 pack lasts 1 wk, or more. Discussed different txt options such as nicotine patches, gum and/or rx medications.  -Script sent for Chantix starter pack and 11mocontinuing packs (12wk total) at pt request -Encourage pt to use nicotine products in addition to rx to aid with cessation if needed   Long-Term Use of High-Risk Medication   Last Assessment & Plan:  See note on involuntary muscle contractions.   Pure Hypercholesterolemia   Last Assessment & Plan:  Formatting of this note may be different from the original. Tolerating simvastatin 40 mg qd well, continue this. Pt cannot take daily ASA (on warfarin for CVA prophylaxis).  Lab Results  Component Value Date   LDL 70 08/25/2016   Last Assessment & Plan:  Formatting of this note might be different from the original. Tolerating simvastatin 40 mg qd well, continue this. Pt cannot take daily ASA (on warfarin  for CVA prophylaxis).  Lab Results  Component Value Date   LDL 70 08/25/2016   Essential Hypertension   Last Assessment & Plan:  Good control on metoprolol, lasix, losartan despite wt gain-cont meds and urged wt loss.  Last Assessment & Plan:  Disease status: well controlled, denies sxs of hypotension. Seems overcontrolled w/low BP today and low/nl recently. Given use of chronic opioids/Soma and age, need to avoid falls from hypotension if possible.  Medications:  Continue Metoprolol ER 521m(not 25 mg per med list) as prescribed, followed by cardiology.  Pt prescribe Losartan 5029mbut has only been taking 28m75mSTOP Losartan 28mg71mvice provided Pt included: recommended low salt diet, discussed componenents of a healthy diet  Recommended pt to take home BP's for close monitoring of LBP. If numbers begin to increase, will discuss possibility of restarting Losartan 28mg.59mast Assessment & Plan:  Disease status: well controlled, denies sxs of hypotension. Given use of chronic opioids/Soma and age, need to avoid falls from hypotension if possible.  Medications:  Continue Metoprolol ER 50mg (74m25 mg per med list) as prescribed, as well as Lasix, prescribed/ followed by cardiology. Advice provided Pt included: recommended low salt diet, discussed componenents of a healthy diet  Recommended pt to take home BP's for close monitoring of LBP. If numbers begin to increase, will discuss possibility of restarting Losartan 28mg.  28mking non-fasted labs today   Atrial Fibrillation and Flutter (Hcc)  sees by Dr. Newman Pies at [facility] regularly, on warfarin w/stable INRs and digoxin low dose.   Last Assessment & Plan:  INR at goal today at 2. Cont same dose 4 mg (2 mg x 2) on Tue, Thu, Sat; 3 mg (3 mg x 1) all other days r'd warfarin Next INR 4 wks  Overview:  sees by Dr. Newman Pies at [facility] regularly, on warfarin w/stable INRs and digoxin low dose.   Overview:  sees by Dr. Newman Pies at [facility]  regularly, on warfarin w/stable INRs and digoxin low dose.   Last Assessment & Plan:  INR at goal today at 2. Cont same dose 4 mg (2 mg x 2) on Tue, Thu, Sat; 3 mg (3 mg x 1) all other days r'd warfarin Next INR 4 wks  Last Assessment & Plan:  Formatting of this note may be different from the original. He sees [facility] yearly and will schedule a visit with them soon, but I will continue to manage his INRs on warfarin and prescribe digoxin at the guidance of his cardiologist.  INR therapeutic 01/26/16 Lab Results  Component Value Date   INR 2.9 01/26/2016   INR 2.4 12/29/2015   INR 2.5 12/01/2015   sees by Dr. Newman Pies at [facility] regularly, on warfarin w/stable INRs and digoxin low dose.   Overview:  sees by Dr. Newman Pies at [facility] regularly, on warfarin w/stable INRs and digoxin low dose.   Last Assessment & Plan:  INR at goal today at 2. Cont same dose 4 mg (2 mg x 2) on Tue, Thu, Sat; 3 mg (3 mg x 1) all other days r'd warfarin Next INR 4 wks  Last Assessment & Plan:  Mgmt by Dr. Newman Pies at [facility]. Now off warfarin, on Xarelto, also on digoxin, metrolol for rate control. Recently he requested a digoxin rf from me and I declined, stating this should be rx'd by his cardiologist (who prescribes his crestor, xarelto, lasix, Toprol etc), BUT pt called back saying his cardiologist told him I should refill it, which I did. Today we discussed that I am happy to provide routine refills as long as he keeps his regular visits w/cardiology so that I can see that they still want him to take this med and at what dose Overview:  Paroxysmal.  Atrial fibrillation and flutter diagnosed 2010 pre-op from knee surgery.  No history of anti-arrhythmic drugs or cardioversion.    Mixed Hyperlipidemia   Last Assessment & Plan:  Tolerating simva well, cont  Last Assessment & Plan:  CVD risk is 39.3% on rosuvastatin 76m qd-continue this.  Checking non-fasted labs today   Morbid Obesity (Hcc)   Last  Assessment & Plan:  Weight increasing. Check TSH. Urged wt loss. Discussed diet/exerrcise.  Last Assessment & Plan:  BMI 35.81 with weight of 239 lbs. Encourage pt continue working on healthy diet and regular exercise. Recommend a minimum of 2.5 hours/wk of physical activity.   Hypertension, Benign   Last Assessment & Plan:  Disease status: well controlled Medications:  Continue furosemide 20 mg qd, losartan 50 mg qd, and metoprolol 25 mg qd as prescribed. Advice provided Pt included: advised smoking cessation  Last Assessment & Plan:  Disease status: well controlled Medications:  Continue furosemide 20 mg qd, losartan 50 mg qd, and metoprolol 25 mg qd as prescribed. Advice provided Pt included: advised smoking cessation  Losartan and metoprolol refilled.  Last Assessment & Plan:  See other HTN note.   Impaired Fasting Glucose  Last Assessment & Plan:  See diabetes tab.   On statin/ARB. No ASA given warfarin use  Last Assessment & Plan:  Disease status: well controlled, A1cs in prediabetes range, off meds  A1c goal: <8.0%; Pt is at goal. A1c 6.2 today from 6.0 on 02/2017   Not currently on any medications for this  Advice pt included: Encourage weight loss via healthy diet and regular exercise. Offered referral to nutritionist, pt declined. Discussed referral to Dr. Florina Ou obesity clinic, pt will think about this. Urine microalbumin collected. DM HM otherwise UTD.  Checking non-fasted labs today   Strain of Lumbar Region   Last Assessment & Plan:  Seen by UC in [place] on 10/14/2017 for this. He had lifted a heavy object ~2 nights ago and strained his lower back-c/w lumbar strain. Pt was not due for refill on pain med from [facility] until 10/16/2017, but he was given an rx for 4 tablets of oxycodone at [facility]. He was warned that this could possibly violate his pain contract. Pt contacted his pain mgmt doctor today informing him of added oxy rx.  -Script sent for  Medrol dosepack. Has home Soma as well. -advised use heat -HEP provided on AVS  -If swop, pt is advised to rtc or see his pain doctor for further eval/mgmt  -He last filled oxycodone from his pain clinic 09/16/2017 which was 120 pills (30d supply). He last filled his Soma 09/27/2017.   Involuntary Muscle Contractions   Last Assessment & Plan:  Pt tolerating Soma 332m well, continued this. 3 30d rxs escribed (90d total).  Advised pt not to mix rx with ETOH or other medications that promote drowsiness. Drug screen utd (09/2016).Contract updated on 03/31/2017. PDMP showed he last filled his Soma on 06/02/2017 and showed appropriate use. He conts to be prescribed 529moxycodone from Dr. NaHarl Favorut [facility], they are apparently gradually tapering down his chronic opioid-encouraged pt regarding this.   Last Assessment & Plan:  Pt tolerating Soma 35075mell, continued this. Note written to pts pharmacy requesting rx be filled today (09/27/2017=2d early) given pt will be traveling out of town later tonBank of Americadvised pt not to mix rx with ETOH or other medications that promote drowsiness. UDS collected today. Pt reports he accidentally took the medications from his wife's pill box on 09/26/2017. Pts wife takes bupropion and oxycodone so these meds may affect UDS results.Contract updated on 03/31/2017.PDMP showed he last filled Soma 350m70m 08/30/2017. [place] 5[place]    Plan of Care  Pharmacotherapy (Medications Ordered): No orders of the defined types were placed in this encounter.  New Prescriptions   No medications on file   Medications administered today: StuarBreckin Savannahno medications administered during this visit. Lab-work, procedure(s), and/or referral(s): Orders Placed This Encounter  Procedures  . DG Lumbar Spine Complete W/Bend  . ToxASSURE Select 13 (MW), Urine   Imaging and/or  referral(s): None  Interventional therapies: Planned, scheduled, and/or pending:  None at this time . Declines intervention will wait on xray results.    Considering:  (Stop Coumadin for 5 days prior to procedure) Left sided suprascapular nerve radiofrequency ablation    Palliative PRN treatment(s):  (Stop Coumadin for 5 days prior to procedure) Suprascapular nerve block  Possible suprascapular nerve radiofrequency ablation      Provider-requested follow-up: Return in about 2 weeks (around 11/22/2017) for MedMgmt with Me (CrysDonella Stade)[facility] Appointments  Date Time Provider [place]  11/27/2017  8:00 AM Vevelyn Francois, NP [facility] None   Primary Care Physician: Neomia Dear, MD Location: [facility] Note by: Vevelyn Francois NP Date: 11/08/2017; Time: 3:49 PM  Pain Score Disclaimer: We use the NRS-11 scale. This is a self-reported, subjective measurement of pain severity with only modest accuracy. It is used primarily to identify changes within a particular Short. It must be understood that outpatient pain scales are significantly less accurate that those used for research, where they can be applied under ideal controlled circumstances with minimal exposure to variables. In reality, the score is likely to be a combination of pain intensity and pain affect, where pain affect describes the degree of emotional arousal or changes in action readiness caused by the sensory experience of pain. Factors such as social and work situation, setting, emotional state, anxiety levels, expectation, and prior pain experience may influence pain perception and show large inter-individual differences that may also be affected by time variables.  Short instructions provided during this appointment: Short Instructions  ____________________________________________________________________________________________  CANNABIDIOL (AKA: CBD Oil or  Pills)  Applies to: All patients receiving prescriptions of controlled substances (written and/or electronic).  General Information: Cannabidiol (CBD) was discovered in 22. It is one of some 113 identified cannabinoids in cannabis (Marijuana) plants, accounting for up to 40% of the plant's extract. As of 2018, preliminary clinical research on cannabidiol included studies of anxiety, cognition, movement disorders, and pain.  Cannabidiol is consummed in multiple ways, including inhalation of cannabis smoke or vapor, as an aerosol spray into the cheek, and by mouth. It may be supplied as CBD oil containing CBD as the active ingredient (no added tetrahydrocannabinol (THC) or terpenes), a full-plant CBD-dominant hemp extract oil, capsules, dried cannabis, or as a liquid solution. CBD is thought not have the same psychoactivity as THC, and may affect the actions of THC. Studies suggest that CBD may interact with different biological targets, including cannabinoid receptors and other neurotransmitter receptors. As of 2018 the mechanism of action for its biological effects has not been determined.  In the [place], cannabidiol has a limited approval by the Food and Drug Administration (FDA) for treatment of only two types of epilepsy disorders. The side effects of long-term use of the drug include somnolence, decreased appetite, diarrhea, fatigue, malaise, weakness, sleeping problems, and others.  CBD remains a Schedule I drug prohibited for any use.  Legality: Some manufacturers ship CBD products nationally, an illegal action which the FDA has not enforced in 2018, with CBD remaining the subject of an FDA investigational new drug evaluation, and is not considered legal as a dietary supplement or food ingredient as of December 2018. Federal illegality has made it difficult historically to conduct research on CBD. CBD is openly sold in head shops and health food stores in some states where such sales have  not been explicitly legalized.  Warning: Because it is not FDA approved for general use or treatment of pain, it is not required to undergo the same manufacturing controls as prescription drugs.  This means that the available cannabidiol (CBD) may be contaminated with THC.  If this is the case, it will trigger a positive urine drug screen (UDS) test for cannabinoids (Marijuana).  Because a positive UDS for illicit substances is a violation of our medication agreement, your opioid analgesics (pain medicine) may be permanently discontinued. (Last update: 08/16/2017) ____________________________________________________________________________________________

## 2017-11-08 NOTE — Patient Instructions (Signed)
____________________________________________________________________________________________  CANNABIDIOL (AKA: CBD Oil or Pills)  Applies to: All patients receiving prescriptions of controlled substances (written and/or electronic).  General Information: Cannabidiol (CBD) was discovered in 1940. It is one of some 113 identified cannabinoids in cannabis (Marijuana) plants, accounting for up to 40% of the plant's extract. As of 2018, preliminary clinical research on cannabidiol included studies of anxiety, cognition, movement disorders, and pain.  Cannabidiol is consummed in multiple ways, including inhalation of cannabis smoke or vapor, as an aerosol spray into the cheek, and by mouth. It may be supplied as CBD oil containing CBD as the active ingredient (no added tetrahydrocannabinol (THC) or terpenes), a full-plant CBD-dominant hemp extract oil, capsules, dried cannabis, or as a liquid solution. CBD is thought not have the same psychoactivity as THC, and may affect the actions of THC. Studies suggest that CBD may interact with different biological targets, including cannabinoid receptors and other neurotransmitter receptors. As of 2018 the mechanism of action for its biological effects has not been determined.  In the [place], cannabidiol has a limited approval by the Food and Drug Administration (FDA) for treatment of only two types of epilepsy disorders. The side effects of long-term use of the drug include somnolence, decreased appetite, diarrhea, fatigue, malaise, weakness, sleeping problems, and others.  CBD remains a Schedule I drug prohibited for any use.  Legality: Some manufacturers ship CBD products nationally, an illegal action which the FDA has not enforced in 2018, with CBD remaining the subject of an FDA investigational new drug evaluation, and is not considered legal as a dietary supplement or food ingredient as of December 2018. Federal illegality has made it difficult  historically to conduct research on CBD. CBD is openly sold in head shops and health food stores in some states where such sales have not been explicitly legalized.  Warning: Because it is not FDA approved for general use or treatment of pain, it is not required to undergo the same manufacturing controls as prescription drugs.  This means that the available cannabidiol (CBD) may be contaminated with THC.  If this is the case, it will trigger a positive urine drug screen (UDS) test for cannabinoids (Marijuana).  Because a positive UDS for illicit substances is a violation of our medication agreement, your opioid analgesics (pain medicine) may be permanently discontinued. (Last update: 08/16/2017) ____________________________________________________________________________________________    

## 2017-11-08 NOTE — Progress Notes (Signed)
Results were reviewed and found to be: mildly abnormal  No acute injury or pathology identified  Review would suggest interventional pain management techniques may be of benefit 

## 2017-11-08 NOTE — Progress Notes (Signed)
Nursing Pain Medication Assessment:  Safety precautions to be maintained throughout the outpatient stay will include: orient to surroundings, keep bed in low position, maintain call bell within reach at all times, provide assistance with transfer out of bed and ambulation.  Medication Inspection Compliance: Pill count conducted under aseptic conditions, in front of the patient. Neither the pills nor the bottle was removed from the patient's sight at any time. Once count was completed pills were immediately returned to the patient in their original bottle.  Medication: Oxycodone IR Pill/Patch Count: 26 of 120 pills remain Pill/Patch Appearance: Markings consistent with prescribed medication Bottle Appearance: Standard pharmacy container. Clearly labeled. Filled Date: 05 / 21 / 2019 Last Medication intake:  Today

## 2017-11-15 LAB — TOXASSURE SELECT 13 (MW), URINE

## 2017-11-27 ENCOUNTER — Other Ambulatory Visit: Payer: Self-pay

## 2017-11-27 ENCOUNTER — Ambulatory Visit: Payer: Medicare Other | Attending: Nurse Practitioner | Admitting: Nurse Practitioner

## 2017-11-27 VITALS — BP 147/78 | HR 64 | Temp 97.7°F | Ht 68.0 in | Wt 226.0 lb

## 2017-11-27 DIAGNOSIS — G8929 Other chronic pain: Secondary | ICD-10-CM

## 2017-11-27 DIAGNOSIS — M25552 Pain in left hip: Secondary | ICD-10-CM | POA: Insufficient documentation

## 2017-11-27 DIAGNOSIS — M25551 Pain in right hip: Secondary | ICD-10-CM | POA: Insufficient documentation

## 2017-11-27 DIAGNOSIS — E119 Type 2 diabetes mellitus without complications: Secondary | ICD-10-CM | POA: Insufficient documentation

## 2017-11-27 DIAGNOSIS — M47816 Spondylosis without myelopathy or radiculopathy, lumbar region: Secondary | ICD-10-CM | POA: Diagnosis not present

## 2017-11-27 DIAGNOSIS — F1120 Opioid dependence, uncomplicated: Secondary | ICD-10-CM | POA: Diagnosis not present

## 2017-11-27 DIAGNOSIS — M7918 Myalgia, other site: Secondary | ICD-10-CM | POA: Diagnosis not present

## 2017-11-27 DIAGNOSIS — Z8551 Personal history of malignant neoplasm of bladder: Secondary | ICD-10-CM | POA: Insufficient documentation

## 2017-11-27 DIAGNOSIS — Z23 Encounter for immunization: Secondary | ICD-10-CM | POA: Insufficient documentation

## 2017-11-27 DIAGNOSIS — M542 Cervicalgia: Secondary | ICD-10-CM | POA: Insufficient documentation

## 2017-11-27 DIAGNOSIS — M25512 Pain in left shoulder: Secondary | ICD-10-CM | POA: Insufficient documentation

## 2017-11-27 DIAGNOSIS — Z96653 Presence of artificial knee joint, bilateral: Secondary | ICD-10-CM | POA: Diagnosis not present

## 2017-11-27 DIAGNOSIS — Z79891 Long term (current) use of opiate analgesic: Secondary | ICD-10-CM | POA: Diagnosis not present

## 2017-11-27 DIAGNOSIS — M1120 Other chondrocalcinosis, unspecified site: Secondary | ICD-10-CM | POA: Insufficient documentation

## 2017-11-27 DIAGNOSIS — F17210 Nicotine dependence, cigarettes, uncomplicated: Secondary | ICD-10-CM | POA: Insufficient documentation

## 2017-11-27 DIAGNOSIS — M5136 Other intervertebral disc degeneration, lumbar region: Secondary | ICD-10-CM | POA: Diagnosis not present

## 2017-11-27 DIAGNOSIS — M25511 Pain in right shoulder: Secondary | ICD-10-CM | POA: Diagnosis not present

## 2017-11-27 DIAGNOSIS — G894 Chronic pain syndrome: Secondary | ICD-10-CM | POA: Diagnosis not present

## 2017-11-27 DIAGNOSIS — M5412 Radiculopathy, cervical region: Secondary | ICD-10-CM

## 2017-11-27 DIAGNOSIS — M545 Low back pain: Secondary | ICD-10-CM | POA: Insufficient documentation

## 2017-11-27 DIAGNOSIS — I4891 Unspecified atrial fibrillation: Secondary | ICD-10-CM | POA: Insufficient documentation

## 2017-11-27 DIAGNOSIS — E782 Mixed hyperlipidemia: Secondary | ICD-10-CM | POA: Diagnosis not present

## 2017-11-27 DIAGNOSIS — Z79899 Other long term (current) drug therapy: Secondary | ICD-10-CM | POA: Diagnosis not present

## 2017-11-27 DIAGNOSIS — S8012XA Contusion of left lower leg, initial encounter: Secondary | ICD-10-CM | POA: Diagnosis not present

## 2017-11-27 DIAGNOSIS — M62838 Other muscle spasm: Secondary | ICD-10-CM | POA: Insufficient documentation

## 2017-11-27 DIAGNOSIS — I10 Essential (primary) hypertension: Secondary | ICD-10-CM | POA: Diagnosis not present

## 2017-11-27 MED ORDER — OXYCODONE HCL 5 MG PO TABS: 5.0000 mg | ORAL_TABLET | Freq: Four times a day (QID) | ORAL | 0 refills | Status: DC | PRN

## 2017-11-27 NOTE — Progress Notes (Signed)
Patient's Name: Aaron Short  MRN: [id number]  Referring Provider: Neomia Dear, MD  DOB: 1946-02-21  PCP: Neomia Dear, MD  DOS: 11/27/2017  Note by: Vevelyn Francois NP  Service setting: Ambulatory outpatient  Specialty: Interventional Pain Management  Location: [facility] (AMB) Pain Management Facility    Patient type: Established    Primary Reason(s) for Visit: Encounter for prescription drug management. (Level of risk: moderate)  CC: Shoulder Pain (pain) and Back Pain (lower)  HPI  Aaron Short is a 72 y.o. year old, male patient, who comes today for a medication management evaluation. He has Long term current use of opiate analgesic; Long term prescription opiate use; Opiate use (97.5 MME/Day); Encounter for therapeutic drug level monitoring; Encounter for chronic pain management; Opioid dependence, daily use ([place]); Chronic hip pain (Location of Secondary source of pain) (Bilateral) (R>L); Chronic shoulder pain (Location of Primary Source of Pain) (Bilateral) (L>R); Chronic low back pain (Location of Tertiary source of pain) (Bilateral) (R>L); Chronic knee pain (Bilateral) (R>L); S/P THR: total hip replacement (Bilateral); S/P TKR: total knee replacement (Bilateral); Atrial fibrillation and flutter ([place]); Malignant neoplasm of urinary bladder ([place]); Type 2 diabetes mellitus ([place]); Muscle spasticity; Coumadin anticoagulation; Mixed hyperlipidemia; Pure hypercholesterolemia; History of bladder cancer; History of hip fracture; History of pelvic fracture; Long-term use of high-risk medication; Chronic neck pain; Chondrocalcinosis; Chronic shoulder pain (Radicular); History of shoulder surgery; Myofascial pain; Musculoskeletal pain; Muscle spasm; Lumbar facet syndrome (Bilateral) (R>L); Lumbar spondylosis; Morbid obesity ([place]); Continuous opioid dependence ([place]); Disturbance of skin sensation; Malignant neoplasm of bladder ([place]); Smoker; FH: atrial fibrillation; Malignant neoplasm of  overlapping sites of bladder ([place]); Obesity, Class I, BMI 30-34.9; Chronic pain syndrome; Hypertension, benign; Impaired fasting glucose; Chronic acromioclavicular joint pain (Left); Osteoarthritis of shoulder (Bilateral) (L>R); 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 His primarily concern today is the Shoulder Pain (pain) and Back Pain (lower)  Pain Assessment: Location: Left Shoulder Radiating: down arm to wrist Onset: More than a month ago Duration: Chronic pain Quality: Constant, Aching, Dull, Discomfort Severity: 5 /10 (subjective, self-reported pain score)  Note: Reported level is compatible with observation.                          Effect on ADL: ADL's, moving arm up and down Modifying factors: medications, exercise BP: (!) 147/78  HR: 64  Aaron Short was last scheduled for an appointment on 11/08/2017 for medication management. During today's appointment we reviewed Aaron Short chronic pain status, as well as his outpatient medication regimen. He is today for evaluation of his recent UDS. He tested positive for THC after he was using CBD oil. He admits that he is no longer using.  The patient  reports that he does not use drugs. His body mass index is 34.36 kg/m.  Further details on both, my assessment(s), as well as the proposed treatment plan, please see below.  Controlled Substance Pharmacotherapy Assessment REMS (Risk Evaluation and Mitigation Strategy)  Analgesic:Oxycodone IR 5 mg every 6hours (5 mg/dayof oxycodone)  MME:22.55mq per day   GIgnatius Specking RN  11/27/2017  8:06 AM  Sign at close encounter Nursing Pain Medication Assessment:  Safety precautions to be maintained throughout the  outpatient stay will include: orient to surroundings, keep bed in low position, maintain call  bell within reach at all times, provide assistance with transfer out of bed and ambulation.  Medication Inspection Compliance: Pill count conducted under aseptic conditions, in front of the patient. Neither the pills nor the bottle was removed from the patient's sight at any time. Once count was completed pills were immediately returned to the patient in their original bottle.  Medication: Oxycodone IR Pill/Patch Count: 0 of 120 pills remain Pill/Patch Appearance: Markings consistent with prescribed medication Bottle Appearance: Standard pharmacy container. Clearly labeled. Filled Date: 5 / 21 / 2018 Last Medication intake:  Yesterday   Patient states that he has stopped taking the CDB oil.   Pharmacokinetics: Liberation and absorption (onset of action): WNL Distribution (time to peak effect): WNL Metabolism and excretion (duration of action): WNL         Pharmacodynamics: Desired effects: Analgesia: Aaron Short reports >50% benefit. Functional ability: Patient reports that medication allows him to accomplish basic ADLs Clinically meaningful improvement in function (CMIF): Sustained CMIF goals met Perceived effectiveness: Described as relatively effective, allowing for increase in activities of daily living (ADL) Undesirable effects: Side-effects or Adverse reactions: None reported Monitoring: [place] PMP: Online review of the past 14-monthperiod conducted. Compliant with practice rules and regulations Last UDS on record: Summary  Date Value Ref Range Status  11/08/2017 FINAL  Final    Comment:    ==================================================================== TOXASSURE SELECT 13 (MW) ==================================================================== Test                             Result       Flag       Units Drug Present and Declared for Prescription Verification   Oxycodone                       260          EXPECTED   ng/mg creat   Oxymorphone                    1201         EXPECTED   ng/mg creat   Noroxycodone                   1037         EXPECTED   ng/mg creat   Noroxymorphone                 397          EXPECTED   ng/mg creat    Sources of oxycodone are scheduled prescription medications.    Oxymorphone, noroxycodone, and noroxymorphone are expected    metabolites of oxycodone. Oxymorphone is also available as a    scheduled prescription medication. ==================================================================== Test                      Result    Flag   Units      Ref Range   Creatinine              179              mg/dL      >=20 ==================================================================== Declared Medications:  The flagging and interpretation on this report are based on the  following declared medications.  Unexpected results may arise from  inaccuracies in the declared medications.  **Note: The testing scope of this panel includes these medications:  Oxycodone (Roxicodone)  **Note: The testing scope of this  panel does not include following  reported medications:  Acetaminophen (Tylenol)  Carisoprodol (Soma)  Cetirizine  Digoxin (Lanoxin)  Furosemide (Lasix)  Melatonin  Metoprolol (Toprol)  Naloxone (Narcan)  Rivaroxaban (Xarelto)  Rosuvastatin (Crestor)  Simvastatin (Zocor)  Triamcinolone (Kenalog) ==================================================================== For clinical consultation, please call [phone]. ====================================================================    UDS interpretation: Compliant          Medication Assessment Form: Reviewed. Patient indicates being compliant with therapy Treatment compliance: Compliant Risk Assessment Profile: Aberrant behavior: See prior evaluations. None observed or detected today Comorbid factors increasing risk of overdose: See prior notes. No additional risks  detected today Risk of substance use disorder (SUD): Moderate-to-High Opioid Risk Tool - 11/27/17 0804      Family History of Substance Abuse   Alcohol  Negative    Illegal Drugs  Negative    Rx Drugs  Negative      Personal History of Substance Abuse   Alcohol  Negative    Illegal Drugs  Negative    Rx Drugs  Negative      Psychological Disease   Psychological Disease  Negative    Depression  Negative      Total Score   Opioid Risk Tool Scoring  0    Opioid Risk Interpretation  Low Risk      ORT Scoring interpretation table:  Score <3 = Low Risk for SUD  Score between 4-7 = Moderate Risk for SUD  Score >8 = High Risk for Opioid Abuse   Risk Mitigation Strategies:  Patient Counseling: Covered Patient-Prescriber Agreement (PPA): Present and active  Notification to other healthcare providers: Done  Pharmacologic Plan: No change in therapy, at this time.            Monthly monitoring   Laboratory Chemistry  Inflammation Markers (CRP: Acute Phase) (ESR: Chronic Phase) Lab Results  Component Value Date   CRP <0.5 11/18/2015   ESRSEDRATE 6 11/18/2015                         Rheumatology Markers No results found for: RF, ANA, LABURIC, URICUR, LYMEIGGIGMAB, LYMEABIGMQN, HLAB27                      Renal Function Markers Lab Results  Component Value Date   BUN 15 11/18/2015   CREATININE 0.80 11/18/2015   GFRAA >60 11/18/2015   GFRNONAA >60 11/18/2015                             Hepatic Function Markers Lab Results  Component Value Date   AST 19 11/18/2015   ALT 17 11/18/2015   ALBUMIN 4.1 11/18/2015   ALKPHOS 68 11/18/2015                        Electrolytes Lab Results  Component Value Date   NA 139 11/18/2015   K 4.3 11/18/2015   CL 103 11/18/2015   CALCIUM 9.3 11/18/2015   MG 1.8 11/18/2015                        Neuropathy Markers Lab Results  Component Value Date   VITAMINB12 268 11/18/2015                        Bone Pathology Markers Lab  Results  Component Value Date  25OHVITD1 31 11/18/2015   25OHVITD2 <1.0 11/18/2015   25OHVITD3 30 11/18/2015                         Coagulation Parameters No results found for: INR, LABPROT, APTT, PLT, DDIMER                      Cardiovascular Markers No results found for: BNP, CKTOTAL, CKMB, TROPONINI, HGB, HCT                       CA Markers No results found for: CEA, CA125, LABCA2                      Note: Lab results reviewed.  Recent Diagnostic Imaging Results  DG Lumbar Spine Complete W/Bend CLINICAL DATA:  Low back pain for 1 month radiating down LEFT leg, injured back lifting something heavy, lumbar spondylosis, lumbar facet syndrome  EXAM: LUMBAR SPINE - COMPLETE WITH BENDING VIEWS  COMPARISON:  11/18/2015  FINDINGS: Osseous demineralization.  Five non-rib-bearing lumbar vertebra.  Multilevel endplate spur formation throughout lumbar spine.  Minimal disc space narrowing L4-L5.  Vertebral body heights maintained without fracture or bone destruction.  Minimal retrolisthesis at L5-S1 approximately 3 mm which increases to 6 mm with extension and appears unchanged with flexion.  Remaining alignments normal it out abnormal motion with flexion or extension.  SI joints preserved.  BILATERAL hip prostheses.  IMPRESSION: Scattered degenerative disc disease changes of the lumbar spine.  Minimal retrolisthesis at L5-S1 which mildly increases with extension and is unchanged with flexion.  Electronically Signed   By: Lavonia Dana M.D.   On: 11/08/2017 11:02  Complexity Note: Imaging results reviewed. Results shared with Aaron Short, using Layman's terms.                         Meds   Current Outpatient Medications:  .  acetaminophen (TYLENOL) 500 MG tablet, Take 500 mg by mouth every 8 (eight) hours as needed., Disp: , Rfl:  .  carisoprodol (SOMA) 350 MG tablet, Take 350 mg by mouth 2 (two) times daily. , Disp: , Rfl:  .  Cetirizine HCl  (ZYRTEC ALLERGY) 10 MG CAPS, Take 1 daily for itching, Disp: 30 capsule, Rfl: 0 .  digoxin (DIGOX) 0.125 MG tablet, TAKE 1 TABLET BY MOUTH EVERY DAY, Disp: , Rfl:  .  Melatonin 5 MG TABS, Take 5 mg by mouth daily as needed. , Disp: , Rfl:  .  metoprolol succinate (TOPROL-XL) 25 MG 24 hr tablet, Take 25 mg by mouth., Disp: , Rfl:  .  Naloxone HCl (NARCAN) 4 MG/0.1ML LIQD, Place 1 spray into the nose once. Spray half of bottle content into each nostril, then call 911, Disp: 2 each, Rfl: 0 .  oxyCODONE (OXY IR/ROXICODONE) 5 MG immediate release tablet, Take 1 tablet (5 mg total) by mouth every 6 (six) hours as needed for severe pain., Disp: 4 tablet, Rfl: 0 .  rivaroxaban (XARELTO) 20 MG TABS tablet, Take 20 mg by mouth., Disp: , Rfl:  .  simvastatin (ZOCOR) 40 MG tablet, Take 40 mg by mouth at bedtime. , Disp: , Rfl:  .  furosemide (LASIX) 20 MG tablet, TAKE 1 TABLET(20 MG) BY MOUTH DAILY AS NEEDED FOR SWELLING, Disp: , Rfl:  .  metoprolol succinate (TOPROL-XL) 25 MG 24 hr tablet, Take 25 mg by mouth., Disp: ,  Rfl:  .  oxyCODONE (OXY IR/ROXICODONE) 5 MG immediate release tablet, Take 1 tablet (5 mg total) by mouth every 6 (six) hours as needed for severe pain., Disp: 120 tablet, Rfl: 0 .  oxyCODONE (OXY IR/ROXICODONE) 5 MG immediate release tablet, Take 1 tablet (5 mg total) by mouth every 6 (six) hours as needed for severe pain., Disp: 120 tablet, Rfl: 0 .  rosuvastatin (CRESTOR) 20 MG tablet, Take 20 mg by mouth daily. , Disp: , Rfl:  .  triamcinolone cream (KENALOG) 0.1 %, Apply 1 application topically 2 (two) times daily. (Patient not taking: Reported on 11/08/2017), Disp: 30 g, Rfl: 0  ROS  Constitutional: Denies any fever or chills Gastrointestinal: No reported hemesis, hematochezia, vomiting, or acute GI distress Musculoskeletal: Denies any acute onset joint swelling, redness, loss of ROM, or weakness Neurological: No reported episodes of acute onset apraxia, aphasia, dysarthria, agnosia,  amnesia, paralysis, loss of coordination, or loss of consciousness  Allergies  Aaron Short is allergic to diltiazem; penicillins; and tizanidine.  PFSH  Drug: Aaron Short  reports that he does not use drugs. Alcohol:  reports that he does not drink alcohol. Tobacco:  reports that he has been smoking cigarettes.  He has been smoking about 0.50 packs per day. He has never used smokeless tobacco. Medical:  has a past medical history of Arthralgia of lower leg (04/02/2012), Arthralgia of upper arm (06/18/2009), Cancer ([place]), Diabetes mellitus without complication ([place]), FH: atrial fibrillation, and Hypertension. Surgical: Aaron Short  has a past surgical history that includes bladder cancer surgery (2015); Joint replacement; Replacement total hip w/  resurfacing implants (Bilateral); Total knee arthroplasty (Bilateral); and Shoulder surgery (Bilateral). Family: family history includes Dementia in his mother; Heart disease in his father.  Constitutional Exam  General appearance: Well nourished, well developed, and well hydrated. In no apparent acute distress Vitals:   11/27/17 0758  BP: (!) 147/78  Pulse: 64  Temp: 97.7 F (36.5 C)  SpO2: 100%  Weight: 226 lb (102.5 kg)  Height: '5\' 8"'  (1.727 m)  Psych/Mental status: Alert, oriented x 3 (person, place, & time)       Eyes: PERLA Respiratory: No evidence of acute respiratory distress  Cervical Spine Area Exam  Skin & Axial Inspection: No masses, redness, edema, swelling, or associated skin lesions Alignment: Symmetrical Functional ROM: Unrestricted ROM      Stability: No instability detected Muscle Tone/Strength: Functionally intact. No obvious neuro-muscular anomalies detected. Sensory (Neurological): Unimpaired Palpation: No palpable anomalies              Upper Extremity (UE) Exam    Side: Right upper extremity  Side: Left upper extremity  Skin & Extremity Inspection: Evidence of prior arthroplastic surgery  Skin &  Extremity Inspection: Evidence of prior arthroplastic surgery  Functional ROM: Decreased ROM          Functional ROM: Decreased ROM          Muscle Tone/Strength: Functionally intact. No obvious neuro-muscular anomalies detected.  Muscle Tone/Strength: Functionally intact. No obvious neuro-muscular anomalies detected.  Sensory (Neurological): Unimpaired          Sensory (Neurological): Unimpaired          Palpation: No palpable anomalies              Palpation: No palpable anomalies              Provocative Test(s):  Phalen's test: deferred Tinel's test: deferred Apley's scratch test (touch opposite shoulder):  Action 1 (Across  chest): deferred Action 2 (Overhead): deferred Action 3 (LB reach): deferred   Provocative Test(s):  Phalen's test: deferred Tinel's test: deferred Apley's scratch test (touch opposite shoulder):  Action 1 (Across chest): deferred Action 2 (Overhead): deferred Action 3 (LB reach): deferred    Gait & Posture Assessment  Ambulation: Unassisted Gait: Relatively normal for age and body habitus Posture: WNL   Assessment  Primary Diagnosis & Pertinent Problem List: The primary encounter diagnosis was Lumbar spondylosis. Diagnoses of Chronic shoulder pain (Location of Primary Source of Pain) (Bilateral) (L>R), Myofascial pain, Chronic shoulder pain (Radicular), and Chronic pain syndrome were also pertinent to this visit.  Status Diagnosis  Controlled Controlled Controlled 1. Lumbar spondylosis   2. Chronic shoulder pain (Location of Primary Source of Pain) (Bilateral) (L>R)   3. Myofascial pain   4. Chronic shoulder pain (Radicular)   5. Chronic pain syndrome     Problems updated and reviewed during this visit: No problems updated. Plan of Care  Pharmacotherapy (Medications Ordered): Meds ordered this encounter  Medications  . oxyCODONE (OXY IR/ROXICODONE) 5 MG immediate release tablet    Sig: Take 1 tablet (5 mg total) by mouth every 6 (six) hours as  needed for severe pain.    Dispense:  120 tablet    Refill:  0    Do not place this medication, or any other prescription from our practice, on "Automatic Refill". Patient may have prescription filled one day early if pharmacy is closed on scheduled refill date. Do not fill until:11/27/2017 To last until:12/27/2017    Order Specific Question:   Supervising Provider    Answer:   Milinda Pointer [phone]   New Prescriptions   No medications on file   Medications administered today: Aaron Short had no medications administered during this visit. Lab-work, procedure(s), and/or referral(s): No orders of the defined types were placed in this encounter.  Imaging and/or referral(s): None  Interventional therapies: Planned, scheduled, and/or pending:  None at this time .    Considering:  (Stop Coumadin for 5 days prior to procedure) Left sided suprascapular nerve radiofrequency ablation    Palliative PRN treatment(s):  (Stop Coumadin for 5 days prior to procedure) Suprascapular nerve block  Possible suprascapular nerve radiofrequency ablation   Provider-requested follow-up: Return in about 1 month (around 12/25/2017) for MedMgmt with Me Donella Stade Edison Pace).  Future Appointments  Date Time Provider [place]  12/25/2017  8:30 AM Vevelyn Francois, NP [facility] None   Primary Care Physician: Neomia Dear, MD Location: [facility] Outpatient [facility] Management Facility Note by: Vevelyn Francois NP Date: 11/27/2017; Time: 9:56 AM  Pain Score Disclaimer: We use the NRS-11 scale. This is a self-reported, subjective measurement of pain severity with only modest accuracy. It is used primarily to identify changes within a particular patient. It must be understood that outpatient pain Short are significantly less accurate that those used for research, where they can be applied under ideal controlled circumstances with minimal exposure to variables. In reality, the score is likely to be  a combination of pain intensity and pain affect, where pain affect describes the degree of emotional arousal or changes in action readiness caused by the sensory experience of pain. Factors such as social and work situation, setting, emotional state, anxiety levels, expectation, and prior pain experience may influence pain perception and show large inter-individual differences that may also be affected by time variables.  Patient instructions provided during this appointment: Patient Instructions   ____________________________________________________________________________________________  Medication Rules  Applies to: All  patients receiving prescriptions (written or electronic).  Pharmacy of record: Pharmacy where electronic prescriptions will be sent. If written prescriptions are taken to a different pharmacy, please inform the nursing staff. The pharmacy listed in the electronic medical record should be the one where you would like electronic prescriptions to be sent.  Prescription refills: Only during scheduled appointments. Applies to both, written and electronic prescriptions.  NOTE: The following applies primarily to controlled substances (Opioid* Pain Medications).   Patient's responsibilities: 1. Pain Pills: Bring all pain pills to every appointment (except for procedure appointments). 2. Pill Bottles: Bring pills in original pharmacy bottle. Always bring newest bottle. Bring bottle, even if empty. 3. Medication refills: You are responsible for knowing and keeping track of what medications you need refilled. The day before your appointment, write a list of all prescriptions that need to be refilled. Bring that list to your appointment and give it to the admitting nurse. Prescriptions will be written only during appointments. If you forget a medication, it will not be "Called in", "Faxed", or "electronically sent". You will need to get another appointment to get these  prescribed. 4. Prescription Accuracy: You are responsible for carefully inspecting your prescriptions before leaving our office. Have the discharge nurse carefully go over each prescription with you, before taking them home. Make sure that your name is accurately spelled, that your address is correct. Check the name and dose of your medication to make sure it is accurate. Check the number of pills, and the written instructions to make sure they are clear and accurate. Make sure that you are given enough medication to last until your next medication refill appointment. 5. Taking Medication: Take medication as prescribed. Never take more pills than instructed. Never take medication more frequently than prescribed. Taking less pills or less frequently is permitted and encouraged, when it comes to controlled substances (written prescriptions).  6. Inform other Doctors: Always inform, all of your healthcare providers, of all the medications you take. 7. Pain Medication from other Providers: You are not allowed to accept any additional pain medication from any other Doctor or Healthcare provider. There are two exceptions to this rule. (see below) In the event that you require additional pain medication, you are responsible for notifying us, as stated below. 8. Medication Agreement: You are responsible for carefully reading and following our Medication Agreement. This must be signed before receiving any prescriptions from our practice. Safely store a copy of your signed Agreement. Violations to the Agreement will result in no further prescriptions. (Additional copies of our Medication Agreement are available upon request.) 9. Laws, Rules, & Regulations: All patients are expected to follow all Federal and [employer], Rules, [employer]. Ignorance of the Laws does not constitute a valid excuse. The use of any illegal substances is prohibited. 10. Adopted CDC guidelines & recommendations: Target dosing  levels will be at or below 60 MME/day. Use of benzodiazepines** is not recommended.  Exceptions: There are only two exceptions to the rule of not receiving pain medications from other Healthcare Providers. 1. Exception #1 (Emergencies): In the event of an emergency (i.e.: accident requiring emergency care), you are allowed to receive additional pain medication. However, you are responsible for: As soon as you are able, call our office [phone], at any time of the day or night, and leave a message stating your name, the date and nature of the emergency, and the name and dose of the medication prescribed. In the event that your call is  answered by a member of our staff, make sure to document and save the date, time, and the name of the person that took your information.  2. Exception #2 (Planned Surgery): In the event that you are scheduled by another doctor or dentist to have any type of surgery or procedure, you are allowed (for a period no longer than 30 days), to receive additional pain medication, for the acute post-op pain. However, in this case, you are responsible for picking up a copy of our "Post-op Pain Management for Surgeons" handout, and giving it to your surgeon or dentist. This document is available at our office, and does not require an appointment to obtain it. Simply go to our office during business hours (Monday-Thursday from 8:00 AM to 4:00 PM) (Friday 8:00 AM to 12:00 Noon) or if you have a scheduled appointment with [place], prior to your surgery, and ask for it by name. In addition, you will need to provide [place] with your name, name of your surgeon, type of surgery, and date of procedure or surgery.  *Opioid medications include: morphine, codeine, oxycodone, oxymorphone, hydrocodone, hydromorphone, meperidine, tramadol, tapentadol, buprenorphine, fentanyl, methadone. **Benzodiazepine medications include: diazepam (Valium), alprazolam (Xanax), clonazepam (Klonopine), lorazepam (Ativan),  clorazepate (Tranxene), chlordiazepoxide (Librium), estazolam (Prosom), oxazepam (Serax), temazepam (Restoril), triazolam (Halcion) (Last updated: 07/26/2017) ____________________________________________________________________________________________   BMI Assessment: Estimated body mass index is 34.36 kg/m as calculated from the following:   Height as of this encounter: '5\' 8"'  (1.727 m).   Weight as of this encounter: 226 lb (102.5 kg).  BMI interpretation table: BMI level Category Range association with higher incidence of chronic pain  <18 kg/m2 Underweight   18.5-24.9 kg/m2 Ideal body weight   25-29.9 kg/m2 Overweight Increased incidence by 20%  30-34.9 kg/m2 Obese (Class I) Increased incidence by 68%  35-39.9 kg/m2 Severe obesity (Class II) Increased incidence by 136%  >40 kg/m2 Extreme obesity (Class III) Increased incidence by 254%   Patient's current BMI Ideal Body weight  Body mass index is 34.36 kg/m. Ideal body weight: 68.4 kg (150 lb 12.7 oz) Adjusted ideal body weight: 82 kg (180 lb 14 oz)   BMI Readings from Last 4 Encounters:  11/27/17 34.36 kg/m  11/08/17 34.67 kg/m  08/15/17 34.97 kg/m  05/15/17 34.67 kg/m   Wt Readings from Last 4 Encounters:  11/27/17 226 lb (102.5 kg)  11/08/17 228 lb (103.4 kg)  08/15/17 230 lb (104.3 kg)  05/15/17 228 lb (103.4 kg)

## 2017-11-27 NOTE — Progress Notes (Signed)
Nursing Pain Medication Assessment:  Safety precautions to be maintained throughout the outpatient stay will include: orient to surroundings, keep bed in low position, maintain call bell within reach at all times, provide assistance with transfer out of bed and ambulation.  Medication Inspection Compliance: Pill count conducted under aseptic conditions, in front of the patient. Neither the pills nor the bottle was removed from the patient's sight at any time. Once count was completed pills were immediately returned to the patient in their original bottle.  Medication: Oxycodone IR Pill/Patch Count: 0 of 120 pills remain Pill/Patch Appearance: Markings consistent with prescribed medication Bottle Appearance: Standard pharmacy container. Clearly labeled. Filled Date: 5 / 21 / 2018 Last Medication intake:  Yesterday   Patient states that he has stopped taking the CDB oil.

## 2017-11-27 NOTE — Patient Instructions (Addendum)
____________________________________________________________________________________________  Medication Rules  Applies to: All patients receiving prescriptions (written or electronic).  Pharmacy of record: Pharmacy where electronic prescriptions will be sent. If written prescriptions are taken to a different pharmacy, please inform the nursing staff. The pharmacy listed in the electronic medical record should be the one where you would like electronic prescriptions to be sent.  Prescription refills: Only during scheduled appointments. Applies to both, written and electronic prescriptions.  NOTE: The following applies primarily to controlled substances (Opioid* Pain Medications).   Patient's responsibilities: 1. Pain Pills: Bring all pain pills to every appointment (except for procedure appointments). 2. Pill Bottles: Bring pills in original pharmacy bottle. Always bring newest bottle. Bring bottle, even if empty. 3. Medication refills: You are responsible for knowing and keeping track of what medications you need refilled. The day before your appointment, write a list of all prescriptions that need to be refilled. Bring that list to your appointment and give it to the admitting nurse. Prescriptions will be written only during appointments. If you forget a medication, it will not be "Called in", "Faxed", or "electronically sent". You will need to get another appointment to get these prescribed. 4. Prescription Accuracy: You are responsible for carefully inspecting your prescriptions before leaving our office. Have the discharge nurse carefully go over each prescription with you, before taking them home. Make sure that your name is accurately spelled, that your address is correct. Check the name and dose of your medication to make sure it is accurate. Check the number of pills, and the written instructions to make sure they are clear and accurate. Make sure that you are given enough medication to last  until your next medication refill appointment. 5. Taking Medication: Take medication as prescribed. Never take more pills than instructed. Never take medication more frequently than prescribed. Taking less pills or less frequently is permitted and encouraged, when it comes to controlled substances (written prescriptions).  6. Inform other Doctors: Always inform, all of your healthcare providers, of all the medications you take. 7. Pain Medication from other Providers: You are not allowed to accept any additional pain medication from any other Doctor or Healthcare provider. There are two exceptions to this rule. (see below) In the event that you require additional pain medication, you are responsible for notifying us, as stated below. 8. Medication Agreement: You are responsible for carefully reading and following our Medication Agreement. This must be signed before receiving any prescriptions from our practice. Safely store a copy of your signed Agreement. Violations to the Agreement will result in no further prescriptions. (Additional copies of our Medication Agreement are available upon request.) 9. Laws, Rules, & Regulations: All patients are expected to follow all Federal and State Laws, Statutes, Rules, & Regulations. Ignorance of the Laws does not constitute a valid excuse. The use of any illegal substances is prohibited. 10. Adopted CDC guidelines & recommendations: Target dosing levels will be at or below 60 MME/day. Use of benzodiazepines** is not recommended.  Exceptions: There are only two exceptions to the rule of not receiving pain medications from other Healthcare Providers. 1. Exception #1 (Emergencies): In the event of an emergency (i.e.: accident requiring emergency care), you are allowed to receive additional pain medication. However, you are responsible for: As soon as you are able, call our office [phone], at any time of the day or night, and leave a message stating your name, the  date and nature of the emergency, and the name and dose of the medication   prescribed. In the event that your call is answered by a member of our staff, make sure to document and save the date, time, and the name of the person that took your information.  2. Exception #2 (Planned Surgery): In the event that you are scheduled by another doctor or dentist to have any type of surgery or procedure, you are allowed (for a period no longer than 30 days), to receive additional pain medication, for the acute post-op pain. However, in this case, you are responsible for picking up a copy of our "Post-op Pain Management for Surgeons" handout, and giving it to your surgeon or dentist. This document is available at our office, and does not require an appointment to obtain it. Simply go to our office during business hours (Monday-Thursday from 8:00 AM to 4:00 PM) (Friday 8:00 AM to 12:00 Noon) or if you have a scheduled appointment with [place], prior to your surgery, and ask for it by name. In addition, you will need to provide [place] with your name, name of your surgeon, type of surgery, and date of procedure or surgery.  *Opioid medications include: morphine, codeine, oxycodone, oxymorphone, hydrocodone, hydromorphone, meperidine, tramadol, tapentadol, buprenorphine, fentanyl, methadone. **Benzodiazepine medications include: diazepam (Valium), alprazolam (Xanax), clonazepam (Klonopine), lorazepam (Ativan), clorazepate (Tranxene), chlordiazepoxide (Librium), estazolam (Prosom), oxazepam (Serax), temazepam (Restoril), triazolam (Halcion) (Last updated: 07/26/2017) ____________________________________________________________________________________________   BMI Assessment: Estimated body mass index is 34.36 kg/m as calculated from the following:   Height as of this encounter: 5\' 8"  (1.727 m).   Weight as of this encounter: 226 lb (102.5 kg).  BMI interpretation table: BMI level Category Range association with higher  incidence of chronic pain  <18 kg/m2 Underweight   18.5-24.9 kg/m2 Ideal body weight   25-29.9 kg/m2 Overweight Increased incidence by 20%  30-34.9 kg/m2 Obese (Class I) Increased incidence by 68%  35-39.9 kg/m2 Severe obesity (Class II) Increased incidence by 136%  >40 kg/m2 Extreme obesity (Class III) Increased incidence by 254%   Patient's current BMI Ideal Body weight  Body mass index is 34.36 kg/m. Ideal body weight: 68.4 kg (150 lb 12.7 oz) Adjusted ideal body weight: 82 kg (180 lb 14 oz)   BMI Readings from Last 4 Encounters:  11/27/17 34.36 kg/m  11/08/17 34.67 kg/m  08/15/17 34.97 kg/m  05/15/17 34.67 kg/m   Wt Readings from Last 4 Encounters:  11/27/17 226 lb (102.5 kg)  11/08/17 228 lb (103.4 kg)  08/15/17 230 lb (104.3 kg)  05/15/17 228 lb (103.4 kg)

## 2017-11-28 ENCOUNTER — Encounter: Payer: Medicare Other | Admitting: Nurse Practitioner

## 2017-12-25 ENCOUNTER — Ambulatory Visit: Payer: Medicare Other | Attending: Nurse Practitioner | Admitting: Nurse Practitioner

## 2017-12-25 ENCOUNTER — Encounter: Payer: Self-pay | Admitting: Nurse Practitioner

## 2017-12-25 VITALS — BP 121/65 | HR 47 | Temp 97.6°F | Resp 16 | Ht 68.0 in | Wt 229.0 lb

## 2017-12-25 DIAGNOSIS — M25562 Pain in left knee: Secondary | ICD-10-CM | POA: Diagnosis not present

## 2017-12-25 DIAGNOSIS — Z818 Family history of other mental and behavioral disorders: Secondary | ICD-10-CM | POA: Insufficient documentation

## 2017-12-25 DIAGNOSIS — M19012 Primary osteoarthritis, left shoulder: Secondary | ICD-10-CM | POA: Diagnosis not present

## 2017-12-25 DIAGNOSIS — M25512 Pain in left shoulder: Secondary | ICD-10-CM | POA: Diagnosis not present

## 2017-12-25 DIAGNOSIS — Z79899 Other long term (current) drug therapy: Secondary | ICD-10-CM | POA: Diagnosis not present

## 2017-12-25 DIAGNOSIS — E782 Mixed hyperlipidemia: Secondary | ICD-10-CM | POA: Diagnosis not present

## 2017-12-25 DIAGNOSIS — Z8249 Family history of ischemic heart disease and other diseases of the circulatory system: Secondary | ICD-10-CM | POA: Insufficient documentation

## 2017-12-25 DIAGNOSIS — Z8551 Personal history of malignant neoplasm of bladder: Secondary | ICD-10-CM | POA: Insufficient documentation

## 2017-12-25 DIAGNOSIS — G894 Chronic pain syndrome: Secondary | ICD-10-CM

## 2017-12-25 DIAGNOSIS — M542 Cervicalgia: Secondary | ICD-10-CM | POA: Insufficient documentation

## 2017-12-25 DIAGNOSIS — I10 Essential (primary) hypertension: Secondary | ICD-10-CM | POA: Insufficient documentation

## 2017-12-25 DIAGNOSIS — Z96653 Presence of artificial knee joint, bilateral: Secondary | ICD-10-CM | POA: Diagnosis not present

## 2017-12-25 DIAGNOSIS — M7918 Myalgia, other site: Secondary | ICD-10-CM | POA: Diagnosis not present

## 2017-12-25 DIAGNOSIS — E119 Type 2 diabetes mellitus without complications: Secondary | ICD-10-CM | POA: Diagnosis not present

## 2017-12-25 DIAGNOSIS — Z79891 Long term (current) use of opiate analgesic: Secondary | ICD-10-CM | POA: Diagnosis not present

## 2017-12-25 DIAGNOSIS — M25511 Pain in right shoulder: Secondary | ICD-10-CM

## 2017-12-25 DIAGNOSIS — Z5181 Encounter for therapeutic drug level monitoring: Secondary | ICD-10-CM | POA: Diagnosis not present

## 2017-12-25 DIAGNOSIS — M25559 Pain in unspecified hip: Secondary | ICD-10-CM | POA: Diagnosis not present

## 2017-12-25 DIAGNOSIS — Z6834 Body mass index (BMI) 34.0-34.9, adult: Secondary | ICD-10-CM | POA: Diagnosis not present

## 2017-12-25 DIAGNOSIS — Z23 Encounter for immunization: Secondary | ICD-10-CM | POA: Insufficient documentation

## 2017-12-25 DIAGNOSIS — Z883 Allergy status to other anti-infective agents status: Secondary | ICD-10-CM | POA: Insufficient documentation

## 2017-12-25 DIAGNOSIS — R209 Unspecified disturbances of skin sensation: Secondary | ICD-10-CM | POA: Diagnosis not present

## 2017-12-25 DIAGNOSIS — M19011 Primary osteoarthritis, right shoulder: Secondary | ICD-10-CM | POA: Diagnosis not present

## 2017-12-25 DIAGNOSIS — I4891 Unspecified atrial fibrillation: Secondary | ICD-10-CM | POA: Insufficient documentation

## 2017-12-25 DIAGNOSIS — Z7901 Long term (current) use of anticoagulants: Secondary | ICD-10-CM | POA: Insufficient documentation

## 2017-12-25 DIAGNOSIS — M545 Low back pain, unspecified: Secondary | ICD-10-CM

## 2017-12-25 DIAGNOSIS — Z7952 Long term (current) use of systemic steroids: Secondary | ICD-10-CM | POA: Insufficient documentation

## 2017-12-25 DIAGNOSIS — S8012XA Contusion of left lower leg, initial encounter: Secondary | ICD-10-CM | POA: Diagnosis not present

## 2017-12-25 DIAGNOSIS — M25561 Pain in right knee: Secondary | ICD-10-CM | POA: Diagnosis not present

## 2017-12-25 DIAGNOSIS — M549 Dorsalgia, unspecified: Secondary | ICD-10-CM | POA: Insufficient documentation

## 2017-12-25 DIAGNOSIS — G8929 Other chronic pain: Secondary | ICD-10-CM | POA: Diagnosis not present

## 2017-12-25 DIAGNOSIS — M47816 Spondylosis without myelopathy or radiculopathy, lumbar region: Secondary | ICD-10-CM | POA: Diagnosis not present

## 2017-12-25 DIAGNOSIS — Z96643 Presence of artificial hip joint, bilateral: Secondary | ICD-10-CM | POA: Insufficient documentation

## 2017-12-25 DIAGNOSIS — F17210 Nicotine dependence, cigarettes, uncomplicated: Secondary | ICD-10-CM | POA: Insufficient documentation

## 2017-12-25 DIAGNOSIS — Z880 Allergy status to penicillin: Secondary | ICD-10-CM | POA: Insufficient documentation

## 2017-12-25 DIAGNOSIS — Z888 Allergy status to other drugs, medicaments and biological substances status: Secondary | ICD-10-CM | POA: Insufficient documentation

## 2017-12-25 MED ORDER — OXYCODONE HCL 5 MG PO TABS
5.0000 mg | ORAL_TABLET | Freq: Four times a day (QID) | ORAL | 0 refills | Status: DC | PRN
Start: 2017-12-27 — End: 2018-01-24

## 2017-12-25 NOTE — Progress Notes (Signed)
Nursing Pain Medication Assessment:  Safety precautions to be maintained throughout the outpatient stay will include: orient to surroundings, keep bed in low position, maintain call bell within reach at all times, provide assistance with transfer out of bed and ambulation.  Medication Inspection Compliance: Pill count conducted under aseptic conditions, in front of the patient. Neither the pills nor the bottle was removed from the patient's sight at any time. Once count was completed pills were immediately returned to the patient in their original bottle.  Medication: Oxycodone IR Pill/Patch Count: 8 of 120 pills remain Pill/Patch Appearance: Markings consistent with prescribed medication Bottle Appearance: Standard pharmacy container. Clearly labeled. Filled Date: 7 / 2 / 2019 Last Medication intake:  Today

## 2017-12-25 NOTE — Progress Notes (Signed)
Patient's Name: Aaron Short  MRN: [id number]  Referring Provider: Neomia Dear, MD  DOB: 07/05/1945  PCP: Neomia Dear, MD  DOS: 12/25/2017  Note by: Vevelyn Francois NP  Service setting: Ambulatory outpatient  Specialty: Interventional Pain Management  Location: [facility] (AMB) Pain Management Facility    Patient type: Established    Primary Reason(s) for Visit: Encounter for prescription drug management. (Level of risk: moderate)  CC: Back Pain (lower) and Shoulder Pain (left)  HPI  Aaron Short is a 72 y.o. year old, male patient, who comes today for a medication management evaluation. He has Long term current use of opiate analgesic; Long term prescription opiate use; Opiate use (97.5 MME/Day); Encounter for therapeutic drug level monitoring; Encounter for chronic pain management; Opioid dependence, daily use ([place]); Chronic hip pain (Location of Secondary source of pain) (Bilateral) (R>L); Chronic shoulder pain (Location of Primary Source of Pain) (Bilateral) (L>R); Chronic low back pain (Location of Tertiary source of pain) (Bilateral) (R>L); Chronic knee pain (Bilateral) (R>L); S/P THR: total hip replacement (Bilateral); S/P TKR: total knee replacement (Bilateral); Atrial fibrillation and flutter ([place]); Malignant neoplasm of urinary bladder ([place]); Type 2 diabetes mellitus ([place]); Muscle spasticity; Coumadin anticoagulation; Mixed hyperlipidemia; Pure hypercholesterolemia; History of bladder cancer; History of hip fracture; History of pelvic fracture; Long-term use of high-risk medication; Chronic neck pain; Chondrocalcinosis; Chronic shoulder pain (Radicular); History of shoulder surgery; Myofascial pain; Musculoskeletal pain; Muscle spasm; Lumbar facet syndrome (Bilateral) (R>L); Lumbar spondylosis; Morbid obesity ([place]); Continuous opioid dependence ([place]); Disturbance of skin sensation; Malignant neoplasm of bladder ([place]); Smoker; FH: atrial fibrillation; Malignant neoplasm of  overlapping sites of bladder ([place]); Obesity, Class I, BMI 30-34.9; Chronic pain syndrome; Hypertension, benign; Impaired fasting glucose; Chronic acromioclavicular joint pain (Left); Osteoarthritis of shoulder (Bilateral) (L>R); 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 His primarily concern today is the Back Pain (lower) and Shoulder Pain (left)  Pain Assessment: Location: Lower Back Radiating: hips bilateral Onset: More than a month ago Duration: Chronic pain Quality: Aching, Constant, Discomfort Severity: 2 /10 (subjective, self-reported pain score)  Note: Reported level is compatible with observation.                          Effect on ADL: sleeping, bending, twisting, picking up something heavy Timing: Constant Modifying factors: streching, exercise BP: 121/65  HR: (!) 82  Aaron Short was last scheduled for an appointment on 11/27/2017 for medication management. During today's appointment we reviewed Aaron Short chronic pain status, as well as his outpatient medication regimen. He admits that he does have this pain constantly but its there. He admits that he is not sure if it is related to his sleeping positiong. He denies any numbness, tingling or weakness in his legs. He denies any side effects of his medication. He denies any concerns or questions today.   The patient  reports that he does not use drugs. His body mass index is 34.82 kg/m.  Further details on both, my assessment(s), as well as the proposed treatment plan, please see below.  Controlled Substance Pharmacotherapy Assessment REMS (Risk Evaluation and Mitigation Strategy)  Analgesic:Oxycodone IR 5 mg every 6hours (5 mg/dayof oxycodone)   MME:22.72mq per day    GIgnatius Specking RN  12/25/2017  8:10 AM  Sign at close encounter  Nursing Pain Medication Assessment:  Safety precautions to be maintained throughout the outpatient stay will include: orient to surroundings, keep bed in low position, maintain call bell within reach at all times, provide assistance with transfer out of bed and ambulation.  Medication Inspection Compliance: Pill count conducted under aseptic conditions, in front of the patient. Neither the pills nor the bottle was removed from the patient's sight at any time. Once count was completed pills were immediately returned to the patient in their original bottle.  Medication: Oxycodone IR Pill/Patch Count: 8 of 120 pills remain Pill/Patch Appearance: Markings consistent with prescribed medication Bottle Appearance: Standard pharmacy container. Clearly labeled. Filled Date: 7 / 2 / 2019 Last Medication intake:  Today   Pharmacokinetics: Liberation and absorption (onset of action): WNL Distribution (time to peak effect): WNL Metabolism and excretion (duration of action): WNL         Pharmacodynamics: Desired effects: Analgesia: Aaron Short reports >50% benefit. Functional ability: Patient reports that medication allows him to accomplish basic ADLs Clinically meaningful improvement in function (CMIF): Sustained CMIF goals met Perceived effectiveness: Described as relatively effective, allowing for increase in activities of daily living (ADL) Undesirable effects: Side-effects or Adverse reactions: None reported Monitoring: [place] PMP: Online review of the past 53-monthperiod conducted. Compliant with practice rules and regulations Last UDS on record: Summary  Date Value Ref Range Status  11/08/2017 FINAL  Final    Comment:    ==================================================================== TOXASSURE SELECT 13 (MW) ==================================================================== Test                              Result       Flag       Units Drug Present and Declared for Prescription Verification   Oxycodone                      260          EXPECTED   ng/mg creat   Oxymorphone                    1201         EXPECTED   ng/mg creat   Noroxycodone                   1037         EXPECTED   ng/mg creat   Noroxymorphone                 397          EXPECTED   ng/mg creat    Sources of oxycodone are scheduled prescription medications.    Oxymorphone, noroxycodone, and noroxymorphone are expected    metabolites of oxycodone. Oxymorphone is also available as a    scheduled prescription medication. ==================================================================== Test                      Result    Flag   Units      Ref Range   Creatinine              179              mg/dL      >=20 ==================================================================== Declared Medications:  The flagging and interpretation on this report are based on the  following declared medications.  Unexpected results may arise from  inaccuracies in the declared medications.  **Note: The testing scope of this  panel includes these medications:  Oxycodone (Roxicodone)  **Note: The testing scope of this panel does not include following  reported medications:  Acetaminophen (Tylenol)  Carisoprodol (Soma)  Cetirizine  Digoxin (Lanoxin)  Furosemide (Lasix)  Melatonin  Metoprolol (Toprol)  Naloxone (Narcan)  Rivaroxaban (Xarelto)  Rosuvastatin (Crestor)  Simvastatin (Zocor)  Triamcinolone (Kenalog) ==================================================================== For clinical consultation, please call [phone]. ====================================================================    UDS interpretation: Compliant          Medication Assessment Form: Reviewed. Patient indicates being compliant with therapy Treatment compliance: Compliant Risk Assessment Profile: Aberrant behavior: See prior  evaluations. None observed or detected today Comorbid factors increasing risk of overdose: See prior notes. No additional risks detected today Risk of substance use disorder (SUD): [place] Secondary to [facility] in previous UDS will continue to monitor closely. Opioid Risk Tool - 12/25/17 0807      Family History of Substance Abuse   Alcohol  Negative    Illegal Drugs  Negative    Rx Drugs  Negative      Personal History of Substance Abuse   Alcohol  Negative    Illegal Drugs  Negative    Rx Drugs  Negative      Age   Age between 40-45 years   No      History of Preadolescent Sexual Abuse   History of Preadolescent Sexual Abuse  Negative or Male      Psychological Disease   Psychological Disease  Negative    Depression  Negative      Total Score   Opioid Risk Tool Scoring  0    Opioid Risk Interpretation  Low Risk      ORT Scoring interpretation table:  Score <3 = Low Risk for SUD  Score between 4-7 = Moderate Risk for SUD  Score >8 = High Risk for Opioid Abuse   Risk Mitigation Strategies:  Patient Counseling: Covered Patient-Prescriber Agreement (PPA): Present and active  Notification to other healthcare providers: Done  Pharmacologic Plan: No change in therapy, at this time.             Laboratory Chemistry  Inflammation Markers (CRP: Acute Phase) (ESR: Chronic Phase) Lab Results  Component Value Date   CRP <0.5 11/18/2015   ESRSEDRATE 6 11/18/2015                         Rheumatology Markers No results found for: RF, ANA, LABURIC, URICUR, LYMEIGGIGMAB, LYMEABIGMQN, HLAB27                      Renal Function Markers Lab Results  Component Value Date   BUN 15 11/18/2015   CREATININE 0.80 11/18/2015   GFRAA >60 11/18/2015   GFRNONAA >60 11/18/2015                             Hepatic Function Markers Lab Results  Component Value Date   AST 19 11/18/2015   ALT 17 11/18/2015   ALBUMIN 4.1 11/18/2015   ALKPHOS 68 11/18/2015                         Electrolytes Lab Results  Component Value Date   NA 139 11/18/2015   K 4.3 11/18/2015   CL 103 11/18/2015   CALCIUM 9.3 11/18/2015   MG 1.8 11/18/2015  Neuropathy Markers Lab Results  Component Value Date   VITAMINB12 268 11/18/2015                        Bone Pathology Markers Lab Results  Component Value Date   25OHVITD1 31 11/18/2015   25OHVITD2 <1.0 11/18/2015   25OHVITD3 30 11/18/2015                         Coagulation Parameters No results found for: INR, LABPROT, APTT, PLT, DDIMER                      Cardiovascular Markers No results found for: BNP, CKTOTAL, CKMB, TROPONINI, HGB, HCT                       CA Markers No results found for: CEA, CA125, LABCA2                      Note: Lab results reviewed.  Recent Diagnostic Imaging Results  DG Lumbar Spine Complete W/Bend CLINICAL DATA:  Low back pain for 1 month radiating down LEFT leg, injured back lifting something heavy, lumbar spondylosis, lumbar facet syndrome  EXAM: LUMBAR SPINE - COMPLETE WITH BENDING VIEWS  COMPARISON:  11/18/2015  FINDINGS: Osseous demineralization.  Five non-rib-bearing lumbar vertebra.  Multilevel endplate spur formation throughout lumbar spine.  Minimal disc space narrowing L4-L5.  Vertebral body heights maintained without fracture or bone destruction.  Minimal retrolisthesis at L5-S1 approximately 3 mm which increases to 6 mm with extension and appears unchanged with flexion.  Remaining alignments normal it out abnormal motion with flexion or extension.  SI joints preserved.  BILATERAL hip prostheses.  IMPRESSION: Scattered degenerative disc disease changes of the lumbar spine.  Minimal retrolisthesis at L5-S1 which mildly increases with extension and is unchanged with flexion.  Electronically Signed   By: Lavonia Dana M.D.   On: 11/08/2017 11:02  Complexity Note: Imaging results reviewed. Results shared with Mr. Merkey,  using Layman's terms.                         Meds   Current Outpatient Medications:  .  acetaminophen (TYLENOL) 500 MG tablet, Take 500 mg by mouth every 8 (eight) hours as needed., Disp: , Rfl:  .  carisoprodol (SOMA) 350 MG tablet, Take 350 mg by mouth 2 (two) times daily. , Disp: , Rfl:  .  Cetirizine HCl (ZYRTEC ALLERGY) 10 MG CAPS, Take 1 daily for itching, Disp: 30 capsule, Rfl: 0 .  digoxin (DIGOX) 0.125 MG tablet, TAKE 1 TABLET BY MOUTH EVERY DAY, Disp: , Rfl:  .  Melatonin 5 MG TABS, Take 5 mg by mouth daily as needed. , Disp: , Rfl:  .  metoprolol succinate (TOPROL-XL) 25 MG 24 hr tablet, Take 25 mg by mouth., Disp: , Rfl:  .  Naloxone HCl (NARCAN) 4 MG/0.1ML LIQD, Place 1 spray into the nose once. Spray half of bottle content into each nostril, then call 911, Disp: 2 each, Rfl: 0 .  oxyCODONE (OXY IR/ROXICODONE) 5 MG immediate release tablet, Take 1 tablet (5 mg total) by mouth every 6 (six) hours as needed for severe pain., Disp: 4 tablet, Rfl: 0 .  [START ON 12/27/2017] oxyCODONE (OXY IR/ROXICODONE) 5 MG immediate release tablet, Take 1 tablet (5 mg total) by mouth every 6 (six) hours as needed for  severe pain., Disp: 120 tablet, Rfl: 0 .  rivaroxaban (XARELTO) 20 MG TABS tablet, Take 20 mg by mouth., Disp: , Rfl:  .  simvastatin (ZOCOR) 40 MG tablet, Take 40 mg by mouth at bedtime. , Disp: , Rfl:  .  triamcinolone cream (KENALOG) 0.1 %, Apply 1 application topically 2 (two) times daily., Disp: 30 g, Rfl: 0 .  furosemide (LASIX) 20 MG tablet, TAKE 1 TABLET(20 MG) BY MOUTH DAILY AS NEEDED FOR SWELLING, Disp: , Rfl:  .  metoprolol succinate (TOPROL-XL) 25 MG 24 hr tablet, Take 25 mg by mouth., Disp: , Rfl:  .  oxyCODONE (OXY IR/ROXICODONE) 5 MG immediate release tablet, Take 1 tablet (5 mg total) by mouth every 6 (six) hours as needed for severe pain., Disp: 120 tablet, Rfl: 0 .  rosuvastatin (CRESTOR) 20 MG tablet, Take 20 mg by mouth daily. , Disp: , Rfl:   ROS  Constitutional:  Denies any fever or chills Gastrointestinal: No reported hemesis, hematochezia, vomiting, or acute GI distress Musculoskeletal: Denies any acute onset joint swelling, redness, loss of ROM, or weakness Neurological: No reported episodes of acute onset apraxia, aphasia, dysarthria, agnosia, amnesia, paralysis, loss of coordination, or loss of consciousness  Allergies  Mr. Tapp is allergic to diltiazem; penicillins; and tizanidine.  PFSH  Drug: Mr. Kunesh  reports that he does not use drugs. Alcohol:  reports that he does not drink alcohol. Tobacco:  reports that he has been smoking cigarettes.  He has been smoking about 0.50 packs per day. He has never used smokeless tobacco. Medical:  has a past medical history of Arthralgia of lower leg (04/02/2012), Arthralgia of upper arm (06/18/2009), Cancer ([place]), Diabetes mellitus without complication ([place]), FH: atrial fibrillation, and Hypertension. Surgical: Mr. Capers  has a past surgical history that includes bladder cancer surgery (2015); Joint replacement; Replacement total hip w/  resurfacing implants (Bilateral); Total knee arthroplasty (Bilateral); and Shoulder surgery (Bilateral). Family: family history includes Dementia in his mother; Heart disease in his father.  Constitutional Exam  General appearance: Well nourished, well developed, and well hydrated. In no apparent acute distress Vitals:   12/25/17 0801  BP: 121/65  Pulse: (!) 47  Resp: 16  Temp: 97.6 F (36.4 C)  SpO2: 96%  Weight: 229 lb (103.9 kg)  Height: '5\' 8"'  (1.727 m)  Psych/Mental status: Alert, oriented x 3 (person, place, & time)       Eyes: PERLA Respiratory: No evidence of acute respiratory distress  Lumbar Spine Area Exam  Skin & Axial Inspection: No masses, redness, or swelling Alignment: Symmetrical Functional ROM: Adequate ROM       Stability: No instability detected Muscle Tone/Strength: Functionally intact. No obvious neuro-muscular  anomalies detected. Sensory (Neurological): Unimpaired Palpation: No palpable anomalies       Provocative Tests: Lumbar Hyperextension/rotation test: Positive      but completed Lumbar quadrant test (Kemp's test): deferred today       Lumbar Lateral bending test: (+)      but able to perform    Gait & Posture Assessment  Ambulation: Unassisted Gait: Relatively normal for age and body habitus Posture: WNL   Lower Extremity Exam    Side: Right lower extremity  Side: Left lower extremity  Stability: No instability observed          Stability: No instability observed          Skin & Extremity Inspection: Skin color, temperature, and hair growth are WNL. No peripheral edema or cyanosis. No masses,  redness, swelling, asymmetry, or associated skin lesions. No contractures.  Skin & Extremity Inspection: Skin color, temperature, and hair growth are WNL. No peripheral edema or cyanosis. No masses, redness, swelling, asymmetry, or associated skin lesions. No contractures.  Functional ROM: Unrestricted ROM                  Functional ROM: Unrestricted ROM                  Muscle Tone/Strength: Functionally intact. No obvious neuro-muscular anomalies detected.  Muscle Tone/Strength: Functionally intact. No obvious neuro-muscular anomalies detected.  Sensory (Neurological): Unimpaired  Sensory (Neurological): Unimpaired  Palpation: No palpable anomalies  Palpation: No palpable anomalies   Assessment  Primary Diagnosis & Pertinent Problem List: The primary encounter diagnosis was Lumbar spondylosis. Diagnoses of Chronic shoulder pain (Location of Primary Source of Pain) (Bilateral) (L>R), Chronic hip pain (Location of Secondary source of pain) (Bilateral) (R>L), Chronic low back pain (Location of Tertiary source of pain) (Bilateral) (R>L), Chronic pain syndrome, and Long term current use of opiate analgesic were also pertinent to this visit.  Status Diagnosis  Controlled Controlled Controlled 1.  Lumbar spondylosis   2. Chronic shoulder pain (Location of Primary Source of Pain) (Bilateral) (L>R)   3. Chronic hip pain (Location of Secondary source of pain) (Bilateral) (R>L)   4. Chronic low back pain (Location of Tertiary source of pain) (Bilateral) (R>L)   5. Chronic pain syndrome   6. Long term current use of opiate analgesic     Problems updated and reviewed during this visit: No problems updated. Plan of Care  Pharmacotherapy (Medications Ordered): Meds ordered this encounter  Medications  . oxyCODONE (OXY IR/ROXICODONE) 5 MG immediate release tablet    Sig: Take 1 tablet (5 mg total) by mouth every 6 (six) hours as needed for severe pain.    Dispense:  120 tablet    Refill:  0    Do not place this medication, or any other prescription from our practice, on "Automatic Refill". Patient may have prescription filled one day early if pharmacy is closed on scheduled refill date. Do not fill until8/1/2019To last until:01/26/2018    Order Specific Question:   Supervising Provider    Answer:   Milinda Pointer [phone]  PT asked nurse when he would go back to coming Q3 months. Nurse verbalized patient may seek another  Pain clinic  New Prescriptions   No medications on file   Medications administered today: Yamir Carignan had no medications administered during this visit. Lab-work, procedure(s), and/or referral(s): Orders Placed This Encounter  Procedures  . ToxASSURE Select 13 (MW), Urine   Imaging and/or referral(s): None  Interventional therapies: Planned, scheduled, and/or pending:  None at this time.    Considering:  (Stop Coumadin for 5 days prior to procedure) Left sided suprascapular nerve radiofrequency ablation    Palliative PRN treatment(s):  (Stop Coumadin for 5 days prior to procedure) Suprascapular nerve block  Possible suprascapular nerve radiofrequency ablation      Provider-requested follow-up: Return in about 1 month (around  01/22/2018) for MedMgmt with Me Donella Stade Edison Pace).  Future Appointments  Date Time Provider [place]  01/23/2018  8:00 AM Vevelyn Francois, NP [facility] None   Primary Care Physician: Neomia Dear, MD Location: [facility] Outpatient Pain Management Facility Note by: Vevelyn Francois NP Date: 12/25/2017; Time: 12:48 PM  Pain Score Disclaimer: We use the NRS-11 scale. This is a self-reported, subjective measurement of pain severity with only modest accuracy. It  is used primarily to identify changes within a particular patient. It must be understood that outpatient pain scales are significantly less accurate that those used for research, where they can be applied under ideal controlled circumstances with minimal exposure to variables. In reality, the score is likely to be a combination of pain intensity and pain affect, where pain affect describes the degree of emotional arousal or changes in action readiness caused by the sensory experience of pain. Factors such as social and work situation, setting, emotional state, anxiety levels, expectation, and prior pain experience may influence pain perception and show large inter-individual differences that may also be affected by time variables.  Patient instructions provided during this appointment: Patient Instructions   ____________________________________________________________________________________________  Medication Rules  Applies to: All patients receiving prescriptions (written or electronic).  Pharmacy of record: Pharmacy where electronic prescriptions will be sent. If written prescriptions are taken to a different pharmacy, please inform the nursing staff. The pharmacy listed in the electronic medical record should be the one where you would like electronic prescriptions to be sent.  Prescription refills: Only during scheduled appointments. Applies to both, written and electronic prescriptions.  NOTE: The following applies primarily to  controlled substances (Opioid* Pain Medications).   Patient's responsibilities: 1. Pain Pills: Bring all pain pills to every appointment (except for procedure appointments). 2. Pill Bottles: Bring pills in original pharmacy bottle. Always bring newest bottle. Bring bottle, even if empty. 3. Medication refills: You are responsible for knowing and keeping track of what medications you need refilled. The day before your appointment, write a list of all prescriptions that need to be refilled. Bring that list to your appointment and give it to the admitting nurse. Prescriptions will be written only during appointments. If you forget a medication, it will not be "Called in", "Faxed", or "electronically sent". You will need to get another appointment to get these prescribed. 4. Prescription Accuracy: You are responsible for carefully inspecting your prescriptions before leaving our office. Have the discharge nurse carefully go over each prescription with you, before taking them home. Make sure that your name is accurately spelled, that your address is correct. Check the name and dose of your medication to make sure it is accurate. Check the number of pills, and the written instructions to make sure they are clear and accurate. Make sure that you are given enough medication to last until your next medication refill appointment. 5. Taking Medication: Take medication as prescribed. Never take more pills than instructed. Never take medication more frequently than prescribed. Taking less pills or less frequently is permitted and encouraged, when it comes to controlled substances (written prescriptions).  6. Inform other Doctors: Always inform, all of your healthcare providers, of all the medications you take. 7. Pain Medication from other Providers: You are not allowed to accept any additional pain medication from any other Doctor or Healthcare provider. There are two exceptions to this rule. (see below) In the event  that you require additional pain medication, you are responsible for notifying us, as stated below. 8. Medication Agreement: You are responsible for carefully reading and following our Medication Agreement. This must be signed before receiving any prescriptions from our practice. Safely store a copy of your signed Agreement. Violations to the Agreement will result in no further prescriptions. (Additional copies of our Medication Agreement are available upon request.) 9. Laws, Rules, & Regulations: All patients are expected to follow all Federal and [employer], Rules, [employer]. Ignorance of the Laws does  not constitute a valid excuse. The use of any illegal substances is prohibited. 10. Adopted CDC guidelines & recommendations: Target dosing levels will be at or below 60 MME/day. Use of benzodiazepines** is not recommended.  Exceptions: There are only two exceptions to the rule of not receiving pain medications from other Healthcare Providers. 1. Exception #1 (Emergencies): In the event of an emergency (i.e.: accident requiring emergency care), you are allowed to receive additional pain medication. However, you are responsible for: As soon as you are able, call our office [phone], at any time of the day or night, and leave a message stating your name, the date and nature of the emergency, and the name and dose of the medication prescribed. In the event that your call is answered by a member of our staff, make sure to document and save the date, time, and the name of the person that took your information.  2. Exception #2 (Planned Surgery): In the event that you are scheduled by another doctor or dentist to have any type of surgery or procedure, you are allowed (for a period no longer than 30 days), to receive additional pain medication, for the acute post-op pain. However, in this case, you are responsible for picking up a copy of our "Post-op Pain Management for Surgeons" handout, and  giving it to your surgeon or dentist. This document is available at our office, and does not require an appointment to obtain it. Simply go to our office during business hours (Monday-Thursday from 8:00 AM to 4:00 PM) (Friday 8:00 AM to 12:00 Noon) or if you have a scheduled appointment with [place], prior to your surgery, and ask for it by name. In addition, you will need to provide [place] with your name, name of your surgeon, type of surgery, and date of procedure or surgery.  *Opioid medications include: morphine, codeine, oxycodone, oxymorphone, hydrocodone, hydromorphone, meperidine, tramadol, tapentadol, buprenorphine, fentanyl, methadone. **Benzodiazepine medications include: diazepam (Valium), alprazolam (Xanax), clonazepam (Klonopine), lorazepam (Ativan), clorazepate (Tranxene), chlordiazepoxide (Librium), estazolam (Prosom), oxazepam (Serax), temazepam (Restoril), triazolam (Halcion) (Last updated: 07/26/2017) ____________________________________________________________________________________________    BMI Assessment: Estimated body mass index is 34.82 kg/m as calculated from the following:   Height as of this encounter: '5\' 8"'  (1.727 m).   Weight as of this encounter: 229 lb (103.9 kg).  BMI interpretation table: BMI level Category Range association with higher incidence of chronic pain  <18 kg/m2 Underweight   18.5-24.9 kg/m2 Ideal body weight   25-29.9 kg/m2 Overweight Increased incidence by 20%  30-34.9 kg/m2 Obese (Class I) Increased incidence by 68%  35-39.9 kg/m2 Severe obesity (Class II) Increased incidence by 136%  >40 kg/m2 Extreme obesity (Class III) Increased incidence by 254%   Patient's current BMI Ideal Body weight  Body mass index is 34.82 kg/m. Ideal body weight: 68.4 kg (150 lb 12.7 oz) Adjusted ideal body weight: 82.6 kg (182 lb 1.2 oz)   BMI Readings from Last 4 Encounters:  12/25/17 34.82 kg/m  11/27/17 34.36 kg/m  11/08/17 34.67 kg/m  08/15/17 34.97 kg/m    Wt Readings from Last 4 Encounters:  12/25/17 229 lb (103.9 kg)  11/27/17 226 lb (102.5 kg)  11/08/17 228 lb (103.4 kg)  08/15/17 230 lb (104.3 kg)

## 2017-12-25 NOTE — Patient Instructions (Addendum)
____________________________________________________________________________________________  Medication Rules  Applies to: All patients receiving prescriptions (written or electronic).  Pharmacy of record: Pharmacy where electronic prescriptions will be sent. If written prescriptions are taken to a different pharmacy, please inform the nursing staff. The pharmacy listed in the electronic medical record should be the one where you would like electronic prescriptions to be sent.  Prescription refills: Only during scheduled appointments. Applies to both, written and electronic prescriptions.  NOTE: The following applies primarily to controlled substances (Opioid* Pain Medications).   Patient's responsibilities: 1. Pain Pills: Bring all pain pills to every appointment (except for procedure appointments). 2. Pill Bottles: Bring pills in original pharmacy bottle. Always bring newest bottle. Bring bottle, even if empty. 3. Medication refills: You are responsible for knowing and keeping track of what medications you need refilled. The day before your appointment, write a list of all prescriptions that need to be refilled. Bring that list to your appointment and give it to the admitting nurse. Prescriptions will be written only during appointments. If you forget a medication, it will not be "Called in", "Faxed", or "electronically sent". You will need to get another appointment to get these prescribed. 4. Prescription Accuracy: You are responsible for carefully inspecting your prescriptions before leaving our office. Have the discharge nurse carefully go over each prescription with you, before taking them home. Make sure that your name is accurately spelled, that your address is correct. Check the name and dose of your medication to make sure it is accurate. Check the number of pills, and the written instructions to make sure they are clear and accurate. Make sure that you are given enough medication to last  until your next medication refill appointment. 5. Taking Medication: Take medication as prescribed. Never take more pills than instructed. Never take medication more frequently than prescribed. Taking less pills or less frequently is permitted and encouraged, when it comes to controlled substances (written prescriptions).  6. Inform other Doctors: Always inform, all of your healthcare providers, of all the medications you take. 7. Pain Medication from other Providers: You are not allowed to accept any additional pain medication from any other Doctor or Healthcare provider. There are two exceptions to this rule. (see below) In the event that you require additional pain medication, you are responsible for notifying us, as stated below. 8. Medication Agreement: You are responsible for carefully reading and following our Medication Agreement. This must be signed before receiving any prescriptions from our practice. Safely store a copy of your signed Agreement. Violations to the Agreement will result in no further prescriptions. (Additional copies of our Medication Agreement are available upon request.) 9. Laws, Rules, & Regulations: All patients are expected to follow all Federal and State Laws, Statutes, Rules, & Regulations. Ignorance of the Laws does not constitute a valid excuse. The use of any illegal substances is prohibited. 10. Adopted CDC guidelines & recommendations: Target dosing levels will be at or below 60 MME/day. Use of benzodiazepines** is not recommended.  Exceptions: There are only two exceptions to the rule of not receiving pain medications from other Healthcare Providers. 1. Exception #1 (Emergencies): In the event of an emergency (i.e.: accident requiring emergency care), you are allowed to receive additional pain medication. However, you are responsible for: As soon as you are able, call our office [phone], at any time of the day or night, and leave a message stating your name, the  date and nature of the emergency, and the name and dose of the medication   prescribed. In the event that your call is answered by a member of our staff, make sure to document and save the date, time, and the name of the person that took your information.  2. Exception #2 (Planned Surgery): In the event that you are scheduled by another doctor or dentist to have any type of surgery or procedure, you are allowed (for a period no longer than 30 days), to receive additional pain medication, for the acute post-op pain. However, in this case, you are responsible for picking up a copy of our "Post-op Pain Management for Surgeons" handout, and giving it to your surgeon or dentist. This document is available at our office, and does not require an appointment to obtain it. Simply go to our office during business hours (Monday-Thursday from 8:00 AM to 4:00 PM) (Friday 8:00 AM to 12:00 Noon) or if you have a scheduled appointment with [place], prior to your surgery, and ask for it by name. In addition, you will need to provide [place] with your name, name of your surgeon, type of surgery, and date of procedure or surgery.  *Opioid medications include: morphine, codeine, oxycodone, oxymorphone, hydrocodone, hydromorphone, meperidine, tramadol, tapentadol, buprenorphine, fentanyl, methadone. **Benzodiazepine medications include: diazepam (Valium), alprazolam (Xanax), clonazepam (Klonopine), lorazepam (Ativan), clorazepate (Tranxene), chlordiazepoxide (Librium), estazolam (Prosom), oxazepam (Serax), temazepam (Restoril), triazolam (Halcion) (Last updated: 07/26/2017) ____________________________________________________________________________________________    BMI Assessment: Estimated body mass index is 34.82 kg/m as calculated from the following:   Height as of this encounter: 5\' 8"  (1.727 m).   Weight as of this encounter: 229 lb (103.9 kg).  BMI interpretation table: BMI level Category Range association with higher  incidence of chronic pain  <18 kg/m2 Underweight   18.5-24.9 kg/m2 Ideal body weight   25-29.9 kg/m2 Overweight Increased incidence by 20%  30-34.9 kg/m2 Obese (Class I) Increased incidence by 68%  35-39.9 kg/m2 Severe obesity (Class II) Increased incidence by 136%  >40 kg/m2 Extreme obesity (Class III) Increased incidence by 254%   Patient's current BMI Ideal Body weight  Body mass index is 34.82 kg/m. Ideal body weight: 68.4 kg (150 lb 12.7 oz) Adjusted ideal body weight: 82.6 kg (182 lb 1.2 oz)   BMI Readings from Last 4 Encounters:  12/25/17 34.82 kg/m  11/27/17 34.36 kg/m  11/08/17 34.67 kg/m  08/15/17 34.97 kg/m   Wt Readings from Last 4 Encounters:  12/25/17 229 lb (103.9 kg)  11/27/17 226 lb (102.5 kg)  11/08/17 228 lb (103.4 kg)  08/15/17 230 lb (104.3 kg)

## 2017-12-26 DIAGNOSIS — Z0000 Encounter for general adult medical examination without abnormal findings: Secondary | ICD-10-CM | POA: Diagnosis not present

## 2017-12-31 LAB — TOXASSURE SELECT 13 (MW), URINE

## 2018-01-23 ENCOUNTER — Ambulatory Visit: Payer: Medicare Other | Admitting: Nurse Practitioner

## 2018-01-24 ENCOUNTER — Ambulatory Visit: Payer: Medicare Other | Attending: Nurse Practitioner | Admitting: Nurse Practitioner

## 2018-01-24 ENCOUNTER — Other Ambulatory Visit: Payer: Self-pay

## 2018-01-24 ENCOUNTER — Encounter: Payer: Self-pay | Admitting: Nurse Practitioner

## 2018-01-24 VITALS — BP 124/67 | HR 60 | Temp 97.5°F | Resp 16 | Ht 68.0 in | Wt 228.0 lb

## 2018-01-24 DIAGNOSIS — E119 Type 2 diabetes mellitus without complications: Secondary | ICD-10-CM | POA: Insufficient documentation

## 2018-01-24 DIAGNOSIS — F17210 Nicotine dependence, cigarettes, uncomplicated: Secondary | ICD-10-CM | POA: Diagnosis not present

## 2018-01-24 DIAGNOSIS — Z6834 Body mass index (BMI) 34.0-34.9, adult: Secondary | ICD-10-CM | POA: Insufficient documentation

## 2018-01-24 DIAGNOSIS — Z818 Family history of other mental and behavioral disorders: Secondary | ICD-10-CM | POA: Insufficient documentation

## 2018-01-24 DIAGNOSIS — Z880 Allergy status to penicillin: Secondary | ICD-10-CM | POA: Insufficient documentation

## 2018-01-24 DIAGNOSIS — Z888 Allergy status to other drugs, medicaments and biological substances status: Secondary | ICD-10-CM | POA: Diagnosis not present

## 2018-01-24 DIAGNOSIS — M1120 Other chondrocalcinosis, unspecified site: Secondary | ICD-10-CM | POA: Insufficient documentation

## 2018-01-24 DIAGNOSIS — Z79899 Other long term (current) drug therapy: Secondary | ICD-10-CM | POA: Insufficient documentation

## 2018-01-24 DIAGNOSIS — Z96643 Presence of artificial hip joint, bilateral: Secondary | ICD-10-CM | POA: Diagnosis not present

## 2018-01-24 DIAGNOSIS — E782 Mixed hyperlipidemia: Secondary | ICD-10-CM | POA: Diagnosis not present

## 2018-01-24 DIAGNOSIS — I4891 Unspecified atrial fibrillation: Secondary | ICD-10-CM | POA: Diagnosis not present

## 2018-01-24 DIAGNOSIS — Z8781 Personal history of (healed) traumatic fracture: Secondary | ICD-10-CM | POA: Insufficient documentation

## 2018-01-24 DIAGNOSIS — M47816 Spondylosis without myelopathy or radiculopathy, lumbar region: Secondary | ICD-10-CM | POA: Insufficient documentation

## 2018-01-24 DIAGNOSIS — M62838 Other muscle spasm: Secondary | ICD-10-CM | POA: Insufficient documentation

## 2018-01-24 DIAGNOSIS — Z79891 Long term (current) use of opiate analgesic: Secondary | ICD-10-CM | POA: Insufficient documentation

## 2018-01-24 DIAGNOSIS — M19012 Primary osteoarthritis, left shoulder: Secondary | ICD-10-CM | POA: Insufficient documentation

## 2018-01-24 DIAGNOSIS — Z5181 Encounter for therapeutic drug level monitoring: Secondary | ICD-10-CM | POA: Insufficient documentation

## 2018-01-24 DIAGNOSIS — I10 Essential (primary) hypertension: Secondary | ICD-10-CM | POA: Insufficient documentation

## 2018-01-24 DIAGNOSIS — I4892 Unspecified atrial flutter: Secondary | ICD-10-CM | POA: Insufficient documentation

## 2018-01-24 DIAGNOSIS — G894 Chronic pain syndrome: Secondary | ICD-10-CM | POA: Diagnosis not present

## 2018-01-24 DIAGNOSIS — Z7901 Long term (current) use of anticoagulants: Secondary | ICD-10-CM | POA: Insufficient documentation

## 2018-01-24 DIAGNOSIS — Z8551 Personal history of malignant neoplasm of bladder: Secondary | ICD-10-CM | POA: Diagnosis not present

## 2018-01-24 DIAGNOSIS — M542 Cervicalgia: Secondary | ICD-10-CM | POA: Insufficient documentation

## 2018-01-24 DIAGNOSIS — M25512 Pain in left shoulder: Secondary | ICD-10-CM | POA: Diagnosis not present

## 2018-01-24 DIAGNOSIS — Z8249 Family history of ischemic heart disease and other diseases of the circulatory system: Secondary | ICD-10-CM | POA: Diagnosis not present

## 2018-01-24 DIAGNOSIS — M6240 Contracture of muscle, unspecified site: Secondary | ICD-10-CM | POA: Diagnosis not present

## 2018-01-24 DIAGNOSIS — Z96653 Presence of artificial knee joint, bilateral: Secondary | ICD-10-CM | POA: Diagnosis not present

## 2018-01-24 DIAGNOSIS — M19011 Primary osteoarthritis, right shoulder: Secondary | ICD-10-CM | POA: Diagnosis not present

## 2018-01-24 MED ORDER — OXYCODONE HCL 5 MG PO TABS
5.0000 mg | ORAL_TABLET | Freq: Four times a day (QID) | ORAL | 0 refills | Status: DC | PRN
Start: 2018-01-26 — End: 2018-03-27

## 2018-01-24 NOTE — Patient Instructions (Addendum)
You have been give Rx for oxycodone to last until 02/25/2018.____________________________________________________________________________________________  Medication Rules  Applies to: All patients receiving prescriptions (written or electronic).  Pharmacy of record: Pharmacy where electronic prescriptions will be sent. If written prescriptions are taken to a different pharmacy, please inform the nursing staff. The pharmacy listed in the electronic medical record should be the one where you would like electronic prescriptions to be sent.  Prescription refills: Only during scheduled appointments. Applies to both, written and electronic prescriptions.  NOTE: The following applies primarily to controlled substances (Opioid* Pain Medications).   Patient's responsibilities: 1. Pain Pills: Bring all pain pills to every appointment (except for procedure appointments). 2. Pill Bottles: Bring pills in original pharmacy bottle. Always bring newest bottle. Bring bottle, even if empty. 3. Medication refills: You are responsible for knowing and keeping track of what medications you need refilled. The day before your appointment, write a list of all prescriptions that need to be refilled. Bring that list to your appointment and give it to the admitting nurse. Prescriptions will be written only during appointments. If you forget a medication, it will not be "Called in", "Faxed", or "electronically sent". You will need to get another appointment to get these prescribed. 4. Prescription Accuracy: You are responsible for carefully inspecting your prescriptions before leaving our office. Have the discharge nurse carefully go over each prescription with you, before taking them home. Make sure that your name is accurately spelled, that your address is correct. Check the name and dose of your medication to make sure it is accurate. Check the number of pills, and the written instructions to make sure they are clear and  accurate. Make sure that you are given enough medication to last until your next medication refill appointment. 5. Taking Medication: Take medication as prescribed. Never take more pills than instructed. Never take medication more frequently than prescribed. Taking less pills or less frequently is permitted and encouraged, when it comes to controlled substances (written prescriptions).  6. Inform other Doctors: Always inform, all of your healthcare providers, of all the medications you take. 7. Pain Medication from other Providers: You are not allowed to accept any additional pain medication from any other Doctor or Healthcare provider. There are two exceptions to this rule. (see below) In the event that you require additional pain medication, you are responsible for notifying us, as stated below. 8. Medication Agreement: You are responsible for carefully reading and following our Medication Agreement. This must be signed before receiving any prescriptions from our practice. Safely store a copy of your signed Agreement. Violations to the Agreement will result in no further prescriptions. (Additional copies of our Medication Agreement are available upon request.) 9. Laws, Rules, & Regulations: All patients are expected to follow all Federal and [employer], [employer], Rules, [employer]. Ignorance of the Laws does not constitute a valid excuse. The use of any illegal substances is prohibited. 10. Adopted CDC guidelines & recommendations: Target dosing levels will be at or below 60 MME/day. Use of benzodiazepines** is not recommended.  Exceptions: There are only two exceptions to the rule of not receiving pain medications from other Healthcare Providers. 1. Exception #1 (Emergencies): In the event of an emergency (i.e.: accident requiring emergency care), you are allowed to receive additional pain medication. However, you are responsible for: As soon as you are able, call our office [phone], at any  time of the day or night, and leave a message stating your name, the date and nature of  the emergency, and the name and dose of the medication prescribed. In the event that your call is answered by a member of our staff, make sure to document and save the date, time, and the name of the person that took your information.  2. Exception #2 (Planned Surgery): In the event that you are scheduled by another doctor or dentist to have any type of surgery or procedure, you are allowed (for a period no longer than 30 days), to receive additional pain medication, for the acute post-op pain. However, in this case, you are responsible for picking up a copy of our "Post-op Pain Management for Surgeons" handout, and giving it to your surgeon or dentist. This document is available at our office, and does not require an appointment to obtain it. Simply go to our office during business hours (Monday-Thursday from 8:00 AM to 4:00 PM) (Friday 8:00 AM to 12:00 Noon) or if you have a scheduled appointment with [place], prior to your surgery, and ask for it by name. In addition, you will need to provide [place] with your name, name of your surgeon, type of surgery, and date of procedure or surgery.  *Opioid medications include: morphine, codeine, oxycodone, oxymorphone, hydrocodone, hydromorphone, meperidine, tramadol, tapentadol, buprenorphine, fentanyl, methadone. **Benzodiazepine medications include: diazepam (Valium), alprazolam (Xanax), clonazepam (Klonopine), lorazepam (Ativan), clorazepate (Tranxene), chlordiazepoxide (Librium), estazolam (Prosom), oxazepam (Serax), temazepam (Restoril), triazolam (Halcion) (Last updated: 07/26/2017) ____________________________________________________________________________________________    BMI Assessment: Estimated body mass index is 34.67 kg/m as calculated from the following:   Height as of this encounter: 5\' 8"  (1.727 m).   Weight as of this encounter: 228 lb (103.4 kg).  BMI  interpretation table: BMI level Category Range association with higher incidence of chronic pain  <18 kg/m2 Underweight   18.5-24.9 kg/m2 Ideal body weight   25-29.9 kg/m2 Overweight Increased incidence by 20%  30-34.9 kg/m2 Obese (Class I) Increased incidence by 68%  35-39.9 kg/m2 Severe obesity (Class II) Increased incidence by 136%  >40 kg/m2 Extreme obesity (Class III) Increased incidence by 254%   Patient's current BMI Ideal Body weight  Body mass index is 34.67 kg/m. Ideal body weight: 68.4 kg (150 lb 12.7 oz) Adjusted ideal body weight: 82.4 kg (181 lb 10.8 oz)   BMI Readings from Last 4 Encounters:  01/24/18 34.67 kg/m  12/25/17 34.82 kg/m  11/27/17 34.36 kg/m  11/08/17 34.67 kg/m   Wt Readings from Last 4 Encounters:  01/24/18 228 lb (103.4 kg)  12/25/17 229 lb (103.9 kg)  11/27/17 226 lb (102.5 kg)  11/08/17 228 lb (103.4 kg)

## 2018-01-24 NOTE — Progress Notes (Signed)
Patient's Name: Aaron Short  MRN: [id number]  Referring Provider: Neomia Dear, MD  DOB: 12/22/1945  PCP: Neomia Dear, MD  DOS: 01/24/2018  Note by: Vevelyn Francois NP  Service setting: Ambulatory outpatient  Specialty: Interventional Pain Management  Location: [facility] (AMB) Pain Management Facility    Patient type: Established    Primary Reason(s) for Visit: Encounter for prescription drug management. (Level of risk: moderate)  CC: Shoulder Pain (left) and Back Pain  HPI  [name] is a 72 y.o. year old, male patient, who comes today for a medication management evaluation. He has Long term current use of opiate analgesic; Long term prescription opiate use; Opiate use (97.5 MME/Day); Encounter for therapeutic drug level monitoring; Encounter for chronic pain management; Opioid dependence, daily use ([place]); Chronic hip pain (Location of Secondary source of pain) (Bilateral) (R>L); Chronic shoulder pain (Location of Primary Source of Pain) (Bilateral) (L>R); Chronic low back pain (Location of Tertiary source of pain) (Bilateral) (R>L); Chronic knee pain (Bilateral) (R>L); S/P THR: total hip replacement (Bilateral); S/P TKR: total knee replacement (Bilateral); Atrial fibrillation and flutter ([place]); Malignant neoplasm of urinary bladder ([place]); Type 2 diabetes mellitus ([place]); Muscle spasticity; Coumadin anticoagulation; Mixed hyperlipidemia; Pure hypercholesterolemia; History of bladder cancer; History of hip fracture; History of pelvic fracture; Long-term use of high-risk medication; Chronic neck pain; Chondrocalcinosis; Chronic shoulder pain ([place]); History of shoulder surgery; Myofascial pain; Musculoskeletal pain; Muscle spasm; Lumbar facet syndrome (Bilateral) (R>L); Lumbar spondylosis; Morbid obesity ([place]); Continuous opioid dependence ([place]); Disturbance of skin sensation; Malignant neoplasm of bladder ([place]); Smoker; FH: atrial fibrillation; Malignant neoplasm of overlapping  sites of bladder ([place]); Obesity, Class I, BMI 30-34.9; Chronic pain syndrome; Hypertension, benign; Impaired fasting glucose; Chronic acromioclavicular joint pain (Left); Osteoarthritis of shoulder (Bilateral) (L>R); 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 His primarily concern today is the Shoulder Pain (left) and Back Pain  Pain Assessment: Location: Left Shoulder Radiating: denies Onset: More than a month ago Duration: Chronic pain Quality: Aching, Constant Severity: 1 /10 (subjective, self-reported pain score)  Note: Reported level is compatible with observation.                          Effect on ADL: difficult to sleep through the night Timing: Constant Modifying factors: bio freeze, meds BP: 124/67  HR: 60  [name] was last scheduled for an appointment on 12/25/2017 for medication management. During today's appointment we reviewed [name] chronic pain status, as well as his outpatient medication regimen. He admits that he continues to have shoulder pain even after the surgery. He is thinking about a revision. He denies any numbness or tingling in his arms.   The patient  reports that he does not use drugs. His body mass index is 34.67 kg/m.  Further details on both, my assessment(s), as well as the proposed treatment plan, please see below.  Controlled Substance Pharmacotherapy Assessment REMS (Risk Evaluation and Mitigation Strategy)  Analgesic:Oxycodone IR 5 mg every 6hours (5 mg/dayof oxycodone)  MME:22.39mq per day WRise Patience RN  01/24/2018  8:51 AM  Signed Nursing Pain Medication Assessment:  Safety precautions to be maintained throughout the outpatient stay will include: orient to  surroundings, keep bed in low position, maintain call bell within reach at all times, provide assistance with transfer  out of bed and ambulation.  Medication Inspection Compliance: Pill count conducted under aseptic conditions, in front of the patient. Neither the pills nor the bottle was removed from the patient's sight at any time. Once count was completed pills were immediately returned to the patient in their original bottle.  Medication: Oxycodone IR Pill/Patch Count: 4 of 120 pills remain Pill/Patch Appearance: Markings consistent with prescribed medication Bottle Appearance: Standard pharmacy container. Clearly labeled. Filled Date: 8 / 1 / 2019 Last Medication intake:  Today   Pharmacokinetics: Liberation and absorption (onset of action): WNL Distribution (time to peak effect): WNL Metabolism and excretion (duration of action): WNL         Pharmacodynamics: Desired effects: Analgesia: [name] reports >50% benefit. Functional ability: Patient reports that medication allows him to accomplish basic ADLs Clinically meaningful improvement in function (CMIF): Sustained CMIF goals met Perceived effectiveness: Described as relatively effective, allowing for increase in activities of daily living (ADL) Undesirable effects: Side-effects or Adverse reactions: None reported Monitoring:  PMP: Online review of the past 13-monthperiod conducted. Compliant with practice rules and regulations Last UDS on record: Summary  Date Value Ref Range Status  12/25/2017 FINAL  Final    Comment:    ==================================================================== TOXASSURE SELECT 13 (MW) ==================================================================== Test                             Result       Flag       Units Drug Present and Declared for Prescription Verification   Oxycodone                      603          EXPECTED   ng/mg creat   Oxymorphone                    1669          EXPECTED   ng/mg creat   Noroxycodone                   1694         EXPECTED   ng/mg creat   Noroxymorphone                 795          EXPECTED   ng/mg creat    Sources of oxycodone are scheduled prescription medications.    Oxymorphone, noroxycodone, and noroxymorphone are expected    metabolites of oxycodone. Oxymorphone is also available as a    scheduled prescription medication. ==================================================================== Test                      Result    Flag   Units      Ref Range   Creatinine              240              mg/dL      >=20 ==================================================================== Declared Medications:  The flagging and interpretation on this report are based on the  following declared medications.  Unexpected results may arise from  inaccuracies in the declared medications.  **Note: The testing scope of this panel includes these medications:  Oxycodone  **Note: The testing scope of this panel does not include following  reported medications:  Acetaminophen  Carisoprodol  Cetirizine  Digoxin  Furosemide  Melatonin  Metoprolol  Rivaroxaban  Rosuvastatin  Simvastatin  Triamcinolone acetonide ==================================================================== For clinical consultation, please call [phone]. ====================================================================    UDS interpretation: Compliant          Medication Assessment Form: Reviewed. Patient indicates being compliant with therapy Treatment compliance: Compliant Risk Assessment Profile: Aberrant behavior: See prior evaluations. None observed or detected today Comorbid factors increasing risk of overdose: See prior notes. No additional risks detected today Opioid risk tool (ORT) (Total Score): 0 Personal History of Substance Abuse (SUD-Substance use disorder):  Alcohol: Negative  Illegal Drugs: Negative  Rx Drugs: Negative  ORT Risk Level  calculation: Low Risk Risk of substance use disorder (SUD): Low Opioid Risk Tool - 01/24/18 0848      Family History of Substance Abuse   Alcohol  Negative    Illegal Drugs  Negative    Rx Drugs  Negative      Personal History of Substance Abuse   Alcohol  Negative    Illegal Drugs  Negative    Rx Drugs  Negative      Age   Age between 73-45 years   No      History of Preadolescent Sexual Abuse   History of Preadolescent Sexual Abuse  Negative or Male      Psychological Disease   Depression  Negative      Total Score   Opioid Risk Tool Scoring  0    Opioid Risk Interpretation  Low Risk      ORT Scoring interpretation table:  Score <3 = Low Risk for SUD  Score between 4-7 = Moderate Risk for SUD  Score >8 = High Risk for Opioid Abuse   Risk Mitigation Strategies:  Patient Counseling: Covered Patient-Prescriber Agreement (PPA): Present and active  Notification to other healthcare providers: Done  Pharmacologic Plan: No change in therapy, at this time.             Laboratory Chemistry  Inflammation Markers (CRP: Acute Phase) (ESR: Chronic Phase) Lab Results  Component Value Date   CRP <0.5 11/18/2015   ESRSEDRATE 6 11/18/2015                         Rheumatology Markers No results found for: RF, ANA, LABURIC, URICUR, LYMEIGGIGMAB, LYMEABIGMQN, HLAB27                      Renal Function Markers Lab Results  Component Value Date   BUN 15 11/18/2015   CREATININE 0.80 11/18/2015   GFRAA >60 11/18/2015   GFRNONAA >60 11/18/2015                             Hepatic Function Markers Lab Results  Component Value Date   AST 19 11/18/2015   ALT 17 11/18/2015   ALBUMIN 4.1 11/18/2015   ALKPHOS 68 11/18/2015                        Electrolytes Lab Results  Component Value Date   NA 139 11/18/2015   K 4.3 11/18/2015   CL 103 11/18/2015   CALCIUM 9.3 11/18/2015   MG 1.8 11/18/2015                        Neuropathy Markers Lab Results  Component Value  Date   VITAMINB12 268 11/18/2015  Bone Pathology Markers Lab Results  Component Value Date   25OHVITD1 31 11/18/2015   25OHVITD2 <1.0 11/18/2015   25OHVITD3 30 11/18/2015                         Coagulation Parameters No results found for: INR, LABPROT, APTT, PLT, DDIMER                      Cardiovascular Markers No results found for: BNP, CKTOTAL, CKMB, TROPONINI, HGB, HCT                       CA Markers No results found for: CEA, CA125, LABCA2                      Note: Lab results reviewed.  Recent Diagnostic Imaging Results  DG Lumbar Spine Complete W/Bend CLINICAL DATA:  Low back pain for 1 month radiating down LEFT leg, injured back lifting something heavy, lumbar spondylosis, lumbar facet syndrome  EXAM: LUMBAR SPINE - COMPLETE WITH BENDING VIEWS  COMPARISON:  11/18/2015  FINDINGS: Osseous demineralization.  Five non-rib-bearing lumbar vertebra.  Multilevel endplate spur formation throughout lumbar spine.  Minimal disc space narrowing L4-L5.  Vertebral body heights maintained without fracture or bone destruction.  Minimal retrolisthesis at L5-S1 approximately 3 mm which increases to 6 mm with extension and appears unchanged with flexion.  Remaining alignments normal it out abnormal motion with flexion or extension.  SI joints preserved.  BILATERAL hip prostheses.  IMPRESSION: Scattered degenerative disc disease changes of the lumbar spine.  Minimal retrolisthesis at L5-S1 which mildly increases with extension and is unchanged with flexion.  Electronically Signed   By: Lavonia Dana M.D.   On: 11/08/2017 11:02  Complexity Note: Imaging results reviewed. Results shared with Mr. Vallee, using Layman's terms.                         Meds   Current Outpatient Medications:  .  acetaminophen (TYLENOL) 500 MG tablet, Take 500 mg by mouth every 8 (eight) hours as needed., Disp: , Rfl:  .  carisoprodol (SOMA) 350 MG  tablet, Take 350 mg by mouth 2 (two) times daily. , Disp: , Rfl:  .  Cetirizine HCl (ZYRTEC ALLERGY) 10 MG CAPS, Take 1 daily for itching, Disp: 30 capsule, Rfl: 0 .  digoxin (DIGOX) 0.125 MG tablet, TAKE 1 TABLET BY MOUTH EVERY DAY, Disp: , Rfl:  .  furosemide (LASIX) 20 MG tablet, TAKE 1 TABLET(20 MG) BY MOUTH DAILY AS NEEDED FOR SWELLING, Disp: , Rfl:  .  Melatonin 5 MG TABS, Take 5 mg by mouth daily as needed. , Disp: , Rfl:  .  metoprolol succinate (TOPROL-XL) 25 MG 24 hr tablet, Take 25 mg by mouth., Disp: , Rfl:  .  Naloxone HCl (NARCAN) 4 MG/0.1ML LIQD, Place 1 spray into the nose once. Spray half of bottle content into each nostril, then call 911, Disp: 2 each, Rfl: 0 .  oxyCODONE (OXY IR/ROXICODONE) 5 MG immediate release tablet, Take 1 tablet (5 mg total) by mouth every 6 (six) hours as needed for severe pain., Disp: 4 tablet, Rfl: 0 .  [START ON 01/26/2018] oxyCODONE (OXY IR/ROXICODONE) 5 MG immediate release tablet, Take 1 tablet (5 mg total) by mouth every 6 (six) hours as needed for severe pain., Disp: 120 tablet, Rfl: 0 .  rivaroxaban (XARELTO) 20 MG TABS  tablet, Take 20 mg by mouth., Disp: , Rfl:  .  simvastatin (ZOCOR) 40 MG tablet, Take 40 mg by mouth at bedtime. , Disp: , Rfl:  .  triamcinolone cream (KENALOG) 0.1 %, Apply 1 application topically 2 (two) times daily., Disp: 30 g, Rfl: 0 .  metoprolol succinate (TOPROL-XL) 25 MG 24 hr tablet, Take 25 mg by mouth., Disp: , Rfl:  .  oxyCODONE (OXY IR/ROXICODONE) 5 MG immediate release tablet, Take 1 tablet (5 mg total) by mouth every 6 (six) hours as needed for severe pain., Disp: 120 tablet, Rfl: 0 .  rosuvastatin (CRESTOR) 20 MG tablet, Take 20 mg by mouth daily. , Disp: , Rfl:   ROS  Constitutional: Denies any fever or chills Gastrointestinal: No reported hemesis, hematochezia, vomiting, or acute GI distress Musculoskeletal: Denies any acute onset joint swelling, redness, loss of ROM, or weakness Neurological: No reported  episodes of acute onset apraxia, aphasia, dysarthria, agnosia, amnesia, paralysis, loss of coordination, or loss of consciousness  Allergies  Mr. Viney is allergic to diltiazem; penicillins; and tizanidine.  PFSH  Drug: Mr. Duchemin  reports that he does not use drugs. Alcohol:  reports that he does not drink alcohol. Tobacco:  reports that he has been smoking cigarettes. He has been smoking about 0.50 packs per day. He has never used smokeless tobacco. Medical:  has a past medical history of Arthralgia of lower leg (04/02/2012), Arthralgia of upper arm (06/18/2009), Cancer ([place]), Diabetes mellitus without complication ([place]), FH: atrial fibrillation, and Hypertension. Surgical: Mr. Sane  has a past surgical history that includes bladder cancer surgery (2015); Joint replacement; Replacement total hip w/  resurfacing implants (Bilateral); Total knee arthroplasty (Bilateral); and Shoulder surgery (Bilateral). Family: family history includes Dementia in his mother; Heart disease in his father.  Constitutional Exam  General appearance: Well nourished, well developed, and well hydrated. In no apparent acute distress Vitals:   01/24/18 0842  BP: 124/67  Pulse: 60  Resp: 16  Temp: (!) 97.5 F (36.4 C)  TempSrc: Oral  SpO2: 93%  Weight: 228 lb (103.4 kg)  Height: '5\' 8"'  (1.727 m)  Psych/Mental status: Alert, oriented x 3 (person, place, & time)       Eyes: PERLA Respiratory: No evidence of acute respiratory distress  Cervical Spine Area Exam  Skin & Axial Inspection: No masses, redness, edema, swelling, or associated skin lesions Alignment: Symmetrical Functional ROM: Unrestricted ROM      Stability: No instability detected Muscle Tone/Strength: Functionally intact. No obvious neuro-muscular anomalies detected. Sensory (Neurological): Unimpaired Palpation: No palpable anomalies              Upper Extremity (UE) Exam    Side: Right upper extremity  Side: Left upper  extremity  Skin & Extremity Inspection: Skin color, temperature, and hair growth are WNL. No peripheral edema or cyanosis. No masses, redness, swelling, asymmetry, or associated skin lesions. No contractures.  Skin & Extremity Inspection: Evidence of prior arthroplastic surgery  Functional ROM: Decreased ROM          Functional ROM: Decreased ROM          Muscle Tone/Strength: Functionally intact. No obvious neuro-muscular anomalies detected.  Muscle Tone/Strength: Functionally intact. No obvious neuro-muscular anomalies detected.  Sensory (Neurological): Unimpaired          Sensory (Neurological): Unimpaired          Palpation: No palpable anomalies              Palpation: Tender  Provocative Test(s):  Phalen's test: deferred Tinel's test: deferred Apley's scratch test (touch opposite shoulder):  Action 1 (Across chest): deferred Action 2 (Overhead): deferred Action 3 (LB reach): deferred   Provocative Test(s):  Phalen's test: deferred Tinel's test: deferred Apley's scratch test (touch opposite shoulder):  Action 1 (Across chest): deferred Action 2 (Overhead): deferred Action 3 (LB reach): deferred    Lumbar Spine Area Exam  Skin & Axial Inspection: No masses, redness, or swelling Alignment: Symmetrical Functional ROM: Unrestricted ROM       Stability: No instability detected Muscle Tone/Strength: Functionally intact. No obvious neuro-muscular anomalies detected. Sensory (Neurological): Unimpaired Palpation: No palpable anomalies        Gait & Posture Assessment  Ambulation: Unassisted Gait: Relatively normal for age and body habitus Posture: WNL   Lower Extremity Exam    Side: Right lower extremity  Side: Left lower extremity  Stability: No instability observed          Stability: No instability observed          Skin & Extremity Inspection: Skin color, temperature, and hair growth are WNL. No peripheral edema or cyanosis. No masses, redness, swelling, asymmetry,  or associated skin lesions. No contractures.  Skin & Extremity Inspection: Skin color, temperature, and hair growth are WNL. No peripheral edema or cyanosis. No masses, redness, swelling, asymmetry, or associated skin lesions. No contractures.  Functional ROM: Unrestricted ROM                  Functional ROM: Unrestricted ROM                  Muscle Tone/Strength: Functionally intact. No obvious neuro-muscular anomalies detected.  Muscle Tone/Strength: Functionally intact. No obvious neuro-muscular anomalies detected.  Sensory (Neurological): Unimpaired  Sensory (Neurological): Unimpaired  Palpation: No palpable anomalies  Palpation: No palpable anomalies   Assessment  Primary Diagnosis & Pertinent Problem List: The primary encounter diagnosis was Lumbar spondylosis. Diagnoses of Chronic acromioclavicular joint pain (Left), Osteoarthritis of shoulder (Bilateral) (L>R), and Chronic pain syndrome were also pertinent to this visit.  Status Diagnosis  Controlled Controlled Controlled 1. Lumbar spondylosis   2. Chronic acromioclavicular joint pain (Left)   3. Osteoarthritis of shoulder (Bilateral) (L>R)   4. Chronic pain syndrome     Problems updated and reviewed during this visit: No problems updated. Plan of Care  Pharmacotherapy (Medications Ordered): Meds ordered this encounter  Medications  . oxyCODONE (OXY IR/ROXICODONE) 5 MG immediate release tablet    Sig: Take 1 tablet (5 mg total) by mouth every 6 (six) hours as needed for severe pain.    Dispense:  120 tablet    Refill:  0    Do not place this medication on "Automatic Refill". Patient may have prescription filled one day early if pharmacy is closed on scheduled refill date. Do not fill until:8/31/2019To last until:02/25/2018    Order Specific Question:   Supervising Provider    Answer:   Milinda Pointer [phone]   New Prescriptions   No medications on file   Medications administered today: Collins Kerby had no  medications administered during this visit. Lab-work, procedure(s), and/or referral(s): No orders of the defined types were placed in this encounter.  Imaging and/or referral(s): None  Interventional therapies: Planned, scheduled, and/or pending:  None at this time.    Considering:  (Stop Coumadin for 5 days prior to procedure) Left sided suprascapular nerve radiofrequency ablation    Palliative PRN treatment(s):  (Stop Coumadin for 5 days prior  to procedure) Suprascapular nerve block  Possible suprascapular nerve radiofrequency ablation      Provider-requested follow-up: Return in about 4 weeks (around 02/21/2018) for MedMgmt with Me Donella Stade Edison Pace).  Future Appointments  Date Time Provider [place]  02/19/2018  9:00 AM Vevelyn Francois, NP [facility] None   Primary Care Physician: Neomia Dear, MD Location: [facility] Note by: Vevelyn Francois NP Date: 01/24/2018; Time: 10:43 AM  Pain Score Disclaimer: We use the NRS-11 scale. This is a self-reported, subjective measurement of pain severity with only modest accuracy. It is used primarily to identify changes within a particular patient. It must be understood that outpatient pain scales are significantly less accurate that those used for research, where they can be applied under ideal controlled circumstances with minimal exposure to variables. In reality, the score is likely to be a combination of pain intensity and pain affect, where pain affect describes the degree of emotional arousal or changes in action readiness caused by the sensory experience of pain. Factors such as social and work situation, setting, emotional state, anxiety levels, expectation, and prior pain experience may influence pain perception and show large inter-individual differences that may also be affected by time variables.  Patient instructions provided during this appointment: Patient Instructions   You  have been give Rx for oxycodone to last until 02/25/2018.____________________________________________________________________________________________  Medication Rules  Applies to: All patients receiving prescriptions (written or electronic).  Pharmacy of record: Pharmacy where electronic prescriptions will be sent. If written prescriptions are taken to a different pharmacy, please inform the nursing staff. The pharmacy listed in the electronic medical record should be the one where you would like electronic prescriptions to be sent.  Prescription refills: Only during scheduled appointments. Applies to both, written and electronic prescriptions.  NOTE: The following applies primarily to controlled substances (Opioid* Pain Medications).   Patient's responsibilities: 1. Pain Pills: Bring all pain pills to every appointment (except for procedure appointments). 2. Pill Bottles: Bring pills in original pharmacy bottle. Always bring newest bottle. Bring bottle, even if empty. 3. Medication refills: You are responsible for knowing and keeping track of what medications you need refilled. The day before your appointment, write a list of all prescriptions that need to be refilled. Bring that list to your appointment and give it to the admitting nurse. Prescriptions will be written only during appointments. If you forget a medication, it will not be "Called in", "Faxed", or "electronically sent". You will need to get another appointment to get these prescribed. 4. Prescription Accuracy: You are responsible for carefully inspecting your prescriptions before leaving our office. Have the discharge nurse carefully go over each prescription with you, before taking them home. Make sure that your name is accurately spelled, that your address is correct. Check the name and dose of your medication to make sure it is accurate. Check the number of pills, and the written instructions to make sure they are clear and  accurate. Make sure that you are given enough medication to last until your next medication refill appointment. 5. Taking Medication: Take medication as prescribed. Never take more pills than instructed. Never take medication more frequently than prescribed. Taking less pills or less frequently is permitted and encouraged, when it comes to controlled substances (written prescriptions).  6. Inform other Doctors: Always inform, all of your healthcare providers, of all the medications you take. 7. Pain Medication from other Providers: You are not allowed to accept any additional pain medication from any other  Doctor or Healthcare provider. There are two exceptions to this rule. (see below) In the event that you require additional pain medication, you are responsible for notifying us, as stated below. 8. Medication Agreement: You are responsible for carefully reading and following our Medication Agreement. This must be signed before receiving any prescriptions from our practice. Safely store a copy of your signed Agreement. Violations to the Agreement will result in no further prescriptions. (Additional copies of our Medication Agreement are available upon request.) 9. Laws, Rules, & Regulations: All patients are expected to follow all Federal and [employer], [employer], Rules, [employer]. Ignorance of the Laws does not constitute a valid excuse. The use of any illegal substances is prohibited. 10. Adopted CDC guidelines & recommendations: Target dosing levels will be at or below 60 MME/day. Use of benzodiazepines** is not recommended.  Exceptions: There are only two exceptions to the rule of not receiving pain medications from other Healthcare Providers. 1. Exception #1 (Emergencies): In the event of an emergency (i.e.: accident requiring emergency care), you are allowed to receive additional pain medication. However, you are responsible for: As soon as you are able, call our office [phone], at any  time of the day or night, and leave a message stating your name, the date and nature of the emergency, and the name and dose of the medication prescribed. In the event that your call is answered by a member of our staff, make sure to document and save the date, time, and the name of the person that took your information.  2. Exception #2 (Planned Surgery): In the event that you are scheduled by another doctor or dentist to have any type of surgery or procedure, you are allowed (for a period no longer than 30 days), to receive additional pain medication, for the acute post-op pain. However, in this case, you are responsible for picking up a copy of our "Post-op Pain Management for Surgeons" handout, and giving it to your surgeon or dentist. This document is available at our office, and does not require an appointment to obtain it. Simply go to our office during business hours (Monday-Thursday from 8:00 AM to 4:00 PM) (Friday 8:00 AM to 12:00 Noon) or if you have a scheduled appointment with [place], prior to your surgery, and ask for it by name. In addition, you will need to provide [place] with your name, name of your surgeon, type of surgery, and date of procedure or surgery.  *Opioid medications include: morphine, codeine, oxycodone, oxymorphone, hydrocodone, hydromorphone, meperidine, tramadol, tapentadol, buprenorphine, fentanyl, methadone. **Benzodiazepine medications include: diazepam (Valium), alprazolam (Xanax), clonazepam (Klonopine), lorazepam (Ativan), clorazepate (Tranxene), chlordiazepoxide (Librium), estazolam (Prosom), oxazepam (Serax), temazepam (Restoril), triazolam (Halcion) (Last updated: 07/26/2017) ____________________________________________________________________________________________    BMI Assessment: Estimated body mass index is 34.67 kg/m as calculated from the following:   Height as of this encounter: '5\' 8"'  (1.727 m).   Weight as of this encounter: 228 lb (103.4 kg).  BMI  interpretation table: BMI level Category Range association with higher incidence of chronic pain  <18 kg/m2 Underweight   18.5-24.9 kg/m2 Ideal body weight   25-29.9 kg/m2 Overweight Increased incidence by 20%  30-34.9 kg/m2 Obese (Class I) Increased incidence by 68%  35-39.9 kg/m2 Severe obesity (Class II) Increased incidence by 136%  >40 kg/m2 Extreme obesity (Class III) Increased incidence by 254%   Patient's current BMI Ideal Body weight  Body mass index is 34.67 kg/m. Ideal body weight: 68.4 kg (150 lb 12.7 oz) Adjusted ideal body weight:  82.4 kg (181 lb 10.8 oz)   BMI Readings from Last 4 Encounters:  01/24/18 34.67 kg/m  12/25/17 34.82 kg/m  11/27/17 34.36 kg/m  11/08/17 34.67 kg/m   Wt Readings from Last 4 Encounters:  01/24/18 228 lb (103.4 kg)  12/25/17 229 lb (103.9 kg)  11/27/17 226 lb (102.5 kg)  11/08/17 228 lb (103.4 kg)

## 2018-01-24 NOTE — Progress Notes (Signed)
Nursing Pain Medication Assessment:  Safety precautions to be maintained throughout the outpatient stay will include: orient to surroundings, keep bed in low position, maintain call bell within reach at all times, provide assistance with transfer out of bed and ambulation.  Medication Inspection Compliance: Pill count conducted under aseptic conditions, in front of the patient. Neither the pills nor the bottle was removed from the patient's sight at any time. Once count was completed pills were immediately returned to the patient in their original bottle.  Medication: Oxycodone IR Pill/Patch Count: 4 of 120 pills remain Pill/Patch Appearance: Markings consistent with prescribed medication Bottle Appearance: Standard pharmacy container. Clearly labeled. Filled Date: 8 / 1 / 2019 Last Medication intake:  Today

## 2018-02-19 ENCOUNTER — Encounter: Payer: Medicare Other | Admitting: Nurse Practitioner

## 2018-02-25 ENCOUNTER — Other Ambulatory Visit: Payer: Self-pay

## 2018-02-25 ENCOUNTER — Encounter: Payer: Self-pay | Admitting: Nurse Practitioner

## 2018-02-25 ENCOUNTER — Ambulatory Visit: Payer: Medicare Other | Attending: Nurse Practitioner | Admitting: Nurse Practitioner

## 2018-02-25 VITALS — BP 132/69 | HR 62 | Temp 98.0°F | Resp 18 | Ht 68.0 in | Wt 230.0 lb

## 2018-02-25 DIAGNOSIS — I10 Essential (primary) hypertension: Secondary | ICD-10-CM | POA: Diagnosis not present

## 2018-02-25 DIAGNOSIS — F17210 Nicotine dependence, cigarettes, uncomplicated: Secondary | ICD-10-CM | POA: Insufficient documentation

## 2018-02-25 DIAGNOSIS — Z79899 Other long term (current) drug therapy: Secondary | ICD-10-CM | POA: Insufficient documentation

## 2018-02-25 DIAGNOSIS — X58XXXA Exposure to other specified factors, initial encounter: Secondary | ICD-10-CM | POA: Diagnosis not present

## 2018-02-25 DIAGNOSIS — F1120 Opioid dependence, uncomplicated: Secondary | ICD-10-CM | POA: Diagnosis not present

## 2018-02-25 DIAGNOSIS — S8012XA Contusion of left lower leg, initial encounter: Secondary | ICD-10-CM | POA: Insufficient documentation

## 2018-02-25 DIAGNOSIS — I4891 Unspecified atrial fibrillation: Secondary | ICD-10-CM | POA: Insufficient documentation

## 2018-02-25 DIAGNOSIS — M25512 Pain in left shoulder: Secondary | ICD-10-CM | POA: Diagnosis not present

## 2018-02-25 DIAGNOSIS — Z96643 Presence of artificial hip joint, bilateral: Secondary | ICD-10-CM | POA: Insufficient documentation

## 2018-02-25 DIAGNOSIS — G894 Chronic pain syndrome: Secondary | ICD-10-CM | POA: Insufficient documentation

## 2018-02-25 DIAGNOSIS — M25561 Pain in right knee: Secondary | ICD-10-CM | POA: Insufficient documentation

## 2018-02-25 DIAGNOSIS — G8929 Other chronic pain: Secondary | ICD-10-CM | POA: Diagnosis not present

## 2018-02-25 DIAGNOSIS — M25562 Pain in left knee: Secondary | ICD-10-CM | POA: Insufficient documentation

## 2018-02-25 DIAGNOSIS — Z79891 Long term (current) use of opiate analgesic: Secondary | ICD-10-CM | POA: Insufficient documentation

## 2018-02-25 DIAGNOSIS — Z23 Encounter for immunization: Secondary | ICD-10-CM | POA: Insufficient documentation

## 2018-02-25 DIAGNOSIS — Z8551 Personal history of malignant neoplasm of bladder: Secondary | ICD-10-CM | POA: Diagnosis not present

## 2018-02-25 DIAGNOSIS — Z880 Allergy status to penicillin: Secondary | ICD-10-CM | POA: Insufficient documentation

## 2018-02-25 DIAGNOSIS — M7918 Myalgia, other site: Secondary | ICD-10-CM | POA: Diagnosis not present

## 2018-02-25 DIAGNOSIS — M542 Cervicalgia: Secondary | ICD-10-CM | POA: Diagnosis not present

## 2018-02-25 DIAGNOSIS — E782 Mixed hyperlipidemia: Secondary | ICD-10-CM | POA: Insufficient documentation

## 2018-02-25 DIAGNOSIS — M25559 Pain in unspecified hip: Secondary | ICD-10-CM

## 2018-02-25 DIAGNOSIS — M19012 Primary osteoarthritis, left shoulder: Secondary | ICD-10-CM | POA: Insufficient documentation

## 2018-02-25 DIAGNOSIS — M545 Low back pain: Secondary | ICD-10-CM | POA: Diagnosis not present

## 2018-02-25 DIAGNOSIS — S39012A Strain of muscle, fascia and tendon of lower back, initial encounter: Secondary | ICD-10-CM | POA: Insufficient documentation

## 2018-02-25 DIAGNOSIS — Z7901 Long term (current) use of anticoagulants: Secondary | ICD-10-CM | POA: Insufficient documentation

## 2018-02-25 DIAGNOSIS — M19011 Primary osteoarthritis, right shoulder: Secondary | ICD-10-CM | POA: Insufficient documentation

## 2018-02-25 DIAGNOSIS — M62838 Other muscle spasm: Secondary | ICD-10-CM | POA: Insufficient documentation

## 2018-02-25 DIAGNOSIS — M25511 Pain in right shoulder: Secondary | ICD-10-CM | POA: Diagnosis not present

## 2018-02-25 DIAGNOSIS — M47816 Spondylosis without myelopathy or radiculopathy, lumbar region: Secondary | ICD-10-CM | POA: Insufficient documentation

## 2018-02-25 DIAGNOSIS — E119 Type 2 diabetes mellitus without complications: Secondary | ICD-10-CM | POA: Insufficient documentation

## 2018-02-25 MED ORDER — OXYCODONE HCL 5 MG PO TABS: 5.0000 mg | ORAL_TABLET | Freq: Four times a day (QID) | ORAL | 0 refills | Status: DC | PRN

## 2018-02-25 NOTE — Progress Notes (Signed)
Patient's Name: Aaron Short  MRN: [id number]  Referring Provider: Neomia Dear, MD  DOB: [birth year]  PCP: Neomia Dear, MD  DOS: 02/25/2018  Note by: Vevelyn Francois NP  Service setting: Ambulatory outpatient  Specialty: Interventional Pain Management  Location: [facility] (AMB) Pain Management Facility    Patient type: Established    Primary Reason(s) for Visit: Encounter for prescription drug management. (Level of risk: moderate)  CC: Shoulder Pain (left) and Back Pain (low)  HPI  Aaron Short is a 72 y.o. year old, male patient, who comes today for a medication management evaluation. He has Long term current use of opiate analgesic; Long term prescription opiate use; Opiate use (97.5 MME/Day); Encounter for therapeutic drug level monitoring; Encounter for chronic pain management; Opioid dependence, daily use ([place]); Chronic hip pain (Location of Secondary source of pain) (Bilateral) (R>L); Chronic shoulder pain (Location of Primary Source of Pain) (Bilateral) (L>R); Chronic low back pain (Location of Tertiary source of pain) (Bilateral) (R>L); Chronic knee pain (Bilateral) (R>L); S/P THR: total hip replacement (Bilateral); S/P TKR: total knee replacement (Bilateral); Atrial fibrillation and flutter ([place]); Malignant neoplasm of urinary bladder ([place]); Type 2 diabetes mellitus ([place]); Muscle spasticity; Coumadin anticoagulation; Mixed hyperlipidemia; Pure hypercholesterolemia; History of bladder cancer; History of hip fracture; History of pelvic fracture; Long-term use of high-risk medication; Chronic neck pain; Chondrocalcinosis; Chronic shoulder pain (Rad[place]); History of shoulder surgery; Myofascial pain; Musculoskeletal pain; Muscle spasm; Lumbar facet syndrome (Bilateral) (R>L); Lumbar spondylosis; Morbid obesity ([place]); Continuous opioid dependence ([place]); Disturbance of skin sensation; Malignant neoplasm of bladder ([place]); Smoker; FH: atrial fibrillation; Malignant neoplasm of  overlapping sites of bladder ([place]); Obesity, Class I, BMI 30-34.9; Chronic pain syndrome; Hypertension, benign; Impaired fasting glucose; Chronic acromioclavicular joint pain (Left); Osteoarthritis of shoulder (Bilateral) (L>R); 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 His primarily concern today is the Shoulder Pain (left) and Back Pain (low)  Pain Assessment: Location: Left Shoulder Radiating: radiates down into left arm and hand Onset: More than a month ago Duration: Chronic pain Quality: Dull(electrical shock at times) Severity: 1 /10 (subjective, self-reported pain score)  Note: Reported level is compatible with observation.                         When using our objective Pain Scale, levels between 6 and 10/10 are said to belong in an emergency room, as it progressively worsens from a 6/10, described as severely limiting, requiring emergency care not usually available at an outpatient pain management facility. At a 6/10 level, communication becomes difficult and requires great effort. Assistance to reach the emergency department may be required. Facial flushing and profuse sweating along with potentially dangerous increases in heart rate and blood pressure will be evident. Effect on ADL: "I have trouble sleeping"  Timing: Intermittent Modifying factors: ice, heat BP: 132/69  HR: 62  Aaron Short was last scheduled for an appointment on 01/24/2018 for medication management. During today's appointment we reviewed Aaron Short chronic pain status, as well as his outpatient medication regimen.  The patient  reports that he does not use drugs. His body mass index is 34.97 kg/m.  Further details on both, my assessment(s),  as well as the proposed treatment plan, please see below.  Controlled Substance Pharmacotherapy Assessment REMS (Risk Evaluation and Mitigation Strategy)  Analgesic:Oxycodone IR 5 mg every 6hours (5 mg/dayof oxycodone)  MME:22.41mq per day Aaron Shorter RN  02/25/2018  8:32 AM  Signed Nursing Pain Medication Assessment:  Safety precautions to be maintained throughout the outpatient stay will include: orient to surroundings, keep bed in low position, maintain call bell within reach at all times, provide assistance with transfer out of bed and ambulation.  Medication Inspection Compliance: Pill count conducted under aseptic conditions, in front of the patient. Neither the pills nor the bottle was removed from the patient's sight at any time. Once count was completed pills were immediately returned to the patient in their original bottle.  Medication: Oxycodone IR Pill/Patch Count: 0 of 120 pills remain Pill/Patch Appearance: Markings consistent with prescribed medication Bottle Appearance: Standard pharmacy container. Clearly labeled. Filled Date:08 /31 / 2019 Last Medication intake:  Yesterday   Pharmacokinetics: Liberation and absorption (onset of action): WNL Distribution (time to peak effect): WNL Metabolism and excretion (duration of action): WNL         Pharmacodynamics: Desired effects: Analgesia: Aaron Short >50% benefit. Functional ability: Patient reports that medication allows him to accomplish basic ADLs Clinically meaningful improvement in function (CMIF): Sustained CMIF goals met Perceived effectiveness: Described as relatively effective, allowing for increase in activities of daily living (ADL) Undesirable effects: Side-effects or Adverse reactions: None reported Monitoring: [place] PMP: Online review of the past 155-montheriod conducted. Compliant with practice rules and regulations Last UDS on record: Summary  Date Value Ref Range Status  12/25/2017  FINAL  Final    Comment:    ==================================================================== TOXASSURE SELECT 13 (MW) ==================================================================== Test                             Result       Flag       Units Drug Present and Declared for Prescription Verification   Oxycodone                      603          EXPECTED   ng/mg creat   Oxymorphone                    1669         EXPECTED   ng/mg creat   Noroxycodone                   1694         EXPECTED   ng/mg creat   Noroxymorphone                 795          EXPECTED   ng/mg creat    Sources of oxycodone are scheduled prescription medications.    Oxymorphone, noroxycodone, and noroxymorphone are expected    metabolites of oxycodone. Oxymorphone is also available as a    scheduled prescription medication. ==================================================================== Test                      Result    Flag   Units      Ref Range   Creatinine              240              mg/dL      >=20 ==================================================================== Declared Medications:  The flagging and interpretation on this report are based on the  following declared medications.  Unexpected results may arise from  inaccuracies in the declared medications.  **Note: The testing scope of this panel includes these medications:  Oxycodone  **Note: The testing scope of this panel does not include following  reported medications:  Acetaminophen  Carisoprodol  Cetirizine  Digoxin  Furosemide  Melatonin  Metoprolol  Rivaroxaban  Rosuvastatin  Simvastatin  Triamcinolone acetonide ==================================================================== For clinical consultation, please call [phone]. ====================================================================    UDS interpretation: Compliant          Medication Assessment Form: Reviewed. Patient indicates being compliant with  therapy Treatment compliance: Compliant Risk Assessment Profile: Aberrant behavior: use of illicit substances and See prior evaluations. None observed or detected today Comorbid factors increasing risk of overdose: caucasian, male gender and has has inapprorpiate UDS in the past [facility]). Per [facility] note in 5/19. He had used medication of out his wifes pill box.  Opioid risk tool (ORT) (Total Score): 0 Personal History of Substance Abuse (SUD-Substance use disorder):  Alcohol: Negative  Illegal Drugs: Negative  Rx Drugs: Negative  ORT Risk Level calculation: Low Risk Risk of substance use disorder (SUD): Low Opioid Risk Tool - 02/25/18 1246      Family History of Substance Abuse   Alcohol  Negative    Illegal Drugs  Negative    Rx Drugs  Negative      Personal History of Substance Abuse   Alcohol  Negative    Illegal Drugs  Negative    Rx Drugs  Negative      Age   Age between 37-45 years   No      History of Preadolescent Sexual Abuse   History of Preadolescent Sexual Abuse  Negative or Male      Psychological Disease   Psychological Disease  Negative    Depression  Negative      Total Score   Opioid Risk Tool Scoring  0    Opioid Risk Interpretation  Low Risk      ORT Scoring interpretation table:  Score <3 = Low Risk for SUD  Score between 4-7 = Moderate Risk for SUD  Score >8 = High Risk for Opioid Abuse   Risk Mitigation Strategies:  Patient Counseling: Covered Patient-Prescriber Agreement (PPA): Present and active  Notification to other healthcare providers: Done  Pharmacologic Plan: No change in therapy, at this time.            Will continue to do frequent UDS and will have patient to return in 30 days.   Laboratory Chemistry  Inflammation Markers (CRP: Acute Phase) (ESR: Chronic Phase) Lab Results  Component Value Date   CRP <0.5 11/18/2015   ESRSEDRATE 6 11/18/2015                         Rheumatology Markers No results found for: RF, ANA, LABURIC,  URICUR, LYMEIGGIGMAB, LYMEABIGMQN, HLAB27                      Renal Function Markers Lab Results  Component Value Date   BUN 15 11/18/2015   CREATININE 0.80 11/18/2015   GFRAA >60 11/18/2015   GFRNONAA >60 11/18/2015                             Hepatic Function Markers Lab Results  Component Value Date   AST 19 11/18/2015   ALT 17 11/18/2015   ALBUMIN 4.1  11/18/2015   ALKPHOS 68 11/18/2015                        Electrolytes Lab Results  Component Value Date   NA 139 11/18/2015   K 4.3 11/18/2015   CL 103 11/18/2015   CALCIUM 9.3 11/18/2015   MG 1.8 11/18/2015                        Neuropathy Markers Lab Results  Component Value Date   HWEXHBZJ69 678 11/18/2015                        CNS Tests No results found for: COLORCSF, APPEARCSF, RBCCOUNTCSF, WBCCSF, POLYSCSF, LYMPHSCSF, EOSCSF, PROTEINCSF, GLUCCSF, JCVIRUS, CSFOLI, IGGCSF                      Bone Pathology Markers Lab Results  Component Value Date   25OHVITD1 31 11/18/2015   25OHVITD2 <1.0 11/18/2015   25OHVITD3 30 11/18/2015                         Coagulation Parameters No results found for: INR, LABPROT, APTT, PLT, DDIMER                      Cardiovascular Markers No results found for: BNP, CKTOTAL, CKMB, TROPONINI, HGB, HCT                       CA Markers No results found for: CEA, CA125, LABCA2                      Note: Lab results reviewed.  Recent Diagnostic Imaging Results  DG Lumbar Spine Complete W/Bend CLINICAL DATA:  Low back pain for 1 month radiating down LEFT leg, injured back lifting something heavy, lumbar spondylosis, lumbar facet syndrome  EXAM: LUMBAR SPINE - COMPLETE WITH BENDING VIEWS  COMPARISON:  11/18/2015  FINDINGS: Osseous demineralization.  Five non-rib-bearing lumbar vertebra.  Multilevel endplate spur formation throughout lumbar spine.  Minimal disc space narrowing L4-L5.  Vertebral body heights maintained without fracture or  bone destruction.  Minimal retrolisthesis at L5-S1 approximately 3 mm which increases to 6 mm with extension and appears unchanged with flexion.  Remaining alignments normal it out abnormal motion with flexion or extension.  SI joints preserved.  BILATERAL hip prostheses.  IMPRESSION: Scattered degenerative disc disease changes of the lumbar spine.  Minimal retrolisthesis at L5-S1 which mildly increases with extension and is unchanged with flexion.  Electronically Signed   By: Lavonia Dana M.D.   On: 11/08/2017 11:02  Complexity Note: Imaging results reviewed. Results shared with Mr. Limbach, using Layman's terms.                         Meds   Current Outpatient Medications:  .  acetaminophen (TYLENOL) 500 MG tablet, Take 500 mg by mouth every 8 (eight) hours as needed., Disp: , Rfl:  .  carisoprodol (SOMA) 350 MG tablet, Take 350 mg by mouth 2 (two) times daily. , Disp: , Rfl:  .  Cetirizine HCl (ZYRTEC ALLERGY) 10 MG CAPS, Take 1 daily for itching, Disp: 30 capsule, Rfl: 0 .  digoxin (DIGOX) 0.125 MG tablet, TAKE 1 TABLET BY MOUTH EVERY DAY, Disp: , Rfl:  .  furosemide (LASIX) 20 MG  tablet, TAKE 1 TABLET(20 MG) BY MOUTH DAILY AS NEEDED FOR SWELLING, Disp: , Rfl:  .  Melatonin 5 MG TABS, Take 5 mg by mouth daily as needed. , Disp: , Rfl:  .  metoprolol succinate (TOPROL-XL) 25 MG 24 hr tablet, Take 25 mg by mouth., Disp: , Rfl:  .  Naloxone HCl (NARCAN) 4 MG/0.1ML LIQD, Place 1 spray into the nose once. Spray half of bottle content into each nostril, then call 911, Disp: 2 each, Rfl: 0 .  oxyCODONE (OXY IR/ROXICODONE) 5 MG immediate release tablet, Take 1 tablet (5 mg total) by mouth every 6 (six) hours as needed for severe pain., Disp: 120 tablet, Rfl: 0 .  rivaroxaban (XARELTO) 20 MG TABS tablet, Take 20 mg by mouth., Disp: , Rfl:  .  rosuvastatin (CRESTOR) 20 MG tablet, Take 20 mg by mouth daily. , Disp: , Rfl:  .  simvastatin (ZOCOR) 40 MG tablet, Take 40 mg by mouth  at bedtime. , Disp: , Rfl:  .  triamcinolone cream (KENALOG) 0.1 %, Apply 1 application topically 2 (two) times daily., Disp: 30 g, Rfl: 0 .  metoprolol succinate (TOPROL-XL) 25 MG 24 hr tablet, Take 25 mg by mouth., Disp: , Rfl:  .  oxyCODONE (OXY IR/ROXICODONE) 5 MG immediate release tablet, Take 1 tablet (5 mg total) by mouth every 6 (six) hours as needed for severe pain., Disp: 120 tablet, Rfl: 0  ROS  Constitutional: Denies any fever or chills Gastrointestinal: No reported hemesis, hematochezia, vomiting, or acute GI distress Musculoskeletal: Denies any acute onset joint swelling, redness, loss of ROM, or weakness Neurological: No reported episodes of acute onset apraxia, aphasia, dysarthria, agnosia, amnesia, paralysis, loss of coordination, or loss of consciousness  Allergies  Mr. Liebler is allergic to diltiazem; penicillins; and tizanidine.  PFSH  Drug: Mr. Pettey  reports that he does not use drugs. Alcohol:  reports that he does not drink alcohol. Tobacco:  reports that he has been smoking cigarettes. He has been smoking about 0.50 packs per day. He has never used smokeless tobacco. Medical:  has a past medical history of Arthralgia of lower leg (04/02/2012), Arthralgia of upper arm (06/18/2009), Cancer ([place]), Diabetes mellitus without complication ([place]), FH: atrial fibrillation, and Hypertension. Surgical: Mr. Valli  has a past surgical history that includes bladder cancer surgery (2015); Joint replacement; Replacement total hip w/  resurfacing implants (Bilateral); Total knee arthroplasty (Bilateral); and Shoulder surgery (Bilateral). Family: family history includes Dementia in his mother; Heart disease in his father.  Constitutional Exam  General appearance: Well nourished, well developed, and well hydrated. In no apparent acute distress Vitals:   02/25/18 0826  BP: 132/69  Pulse: 62  Resp: 18  Temp: 98 F (36.7 C)  SpO2: 100%  Weight: 230 lb (104.3 kg)   Height: '5\' 8"'  (1.727 m)  Psych/Mental status: Alert, oriented x 3 (person, place, & time)       Eyes: PERLA Respiratory: No evidence of acute respiratory distress  Cervical Spine Area Exam  Skin & Axial Inspection: No masses, redness, edema, swelling, or associated skin lesions Alignment: Symmetrical Functional ROM: Unrestricted ROM      Stability: No instability detected Muscle Tone/Strength: Functionally intact. No obvious neuro-muscular anomalies detected. Sensory (Neurological): Unimpaired Palpation: No palpable anomalies              Upper Extremity (UE) Exam    Side: Right upper extremity  Side: Left upper extremity  Skin & Extremity Inspection: Skin color, temperature, and hair growth  are WNL. No peripheral edema or cyanosis. No masses, redness, swelling, asymmetry, or associated skin lesions. No contractures.  Skin & Extremity Inspection: Skin color, temperature, and hair growth are WNL. No peripheral edema or cyanosis. No masses, redness, swelling, asymmetry, or associated skin lesions. No contractures.  Functional ROM: Unrestricted ROM          Functional ROM: Pain restricted ROM          Muscle Tone/Strength: Functionally intact. No obvious neuro-muscular anomalies detected.  Muscle Tone/Strength: WNL  Sensory (Neurological): Unimpaired          Sensory (Neurological): Unimpaired          Palpation: No palpable anomalies              Palpation: No palpable anomalies                   Gait & Posture Assessment  Ambulation: Unassisted Gait: Relatively normal for age and body habitus Posture: WNL   Assessment  Primary Diagnosis & Pertinent Problem List: The primary encounter diagnosis was Chronic shoulder pain (Location of Primary Source of Pain) (Bilateral) (L>R). Diagnoses of Chronic hip pain (Location of Secondary source of pain) (Bilateral) (R>L), Chronic low back pain (Location of Tertiary source of pain) (Bilateral) (R>L), Chronic pain syndrome, and Long term  prescription opiate use were also pertinent to this visit.  Status Diagnosis  Persistent Controlled Controlled 1. Chronic shoulder pain (Location of Primary Source of Pain) (Bilateral) (L>R)   2. Chronic hip pain (Location of Secondary source of pain) (Bilateral) (R>L)   3. Chronic low back pain (Location of Tertiary source of pain) (Bilateral) (R>L)   4. Chronic pain syndrome   5. Long term prescription opiate use     Problems updated and reviewed during this visit: Problem  Involuntary Muscle Contractions   Last Assessment & Plan:  Pt tolerating Soma 360m well, continued this. 3 30d rxs escribed (90d total).  Advised pt not to mix rx with ETOH or other medications that promote drowsiness. Drug screen utd (09/2016).Contract updated on 03/31/2017. PDMP showed he last filled his Soma on 06/02/2017 and showed appropriate use. He conts to be prescribed 567moxycodone from Dr. NaHarl Favorut of [facility], they are apparently gradually tapering down his chronic opioid-encouraged pt regarding this.   Last Assessment & Plan:  Pt tolerating Soma 35045mell, continued this. Note written to pts pharmacy requesting rx be filled today (09/27/2017=2d early) given [place] of [place]vised [place] not to mix rx with ETOH or other medications that promote drowsiness. UDS collected today. Pt reports he accidentally took the medications from his wife's pill box on 09/26/2017. Pts wife takes bupropion and oxycodone so these meds may affect UDS results.Contract updated on 03/31/2017.PDMP showed he last filled Soma 350m41m 08/30/2017. He [place] 5[place]  Last Assessment & Plan:  Pt tolerating Soma 350mg 26mPRN well, continued this. Advised pt not to mix rx with ETOH or other medications that promote drowsiness. UDS from 10/17/2017 was +opiates as expected. Contract updated on 03/31/2017. PDMP showed he last  filled Soma 350mg o71m/27/2019. He continues [place]    Plan of Care  Pharmacotherapy (Medications Ordered): Meds ordered this encounter  Medications  . oxyCODONE (OXY IR/ROXICODONE) 5 MG immediate release tablet    Sig: Take 1 tablet (5 mg  total) by mouth every 6 (six) hours as needed for severe pain.    Dispense:  120 tablet    Refill:  0    Do not place this medication, or any other prescription from our practice, on "Automatic Refill". Patient may have prescription filled one day early if pharmacy is closed on scheduled refill date.    Order Specific Question:   Supervising Provider    Answer:   Milinda Pointer [[id number]]   New Prescriptions   No medications on file   Medications administered today: Tanis Burnley had no medications administered during this visit. Lab-work, procedure(s), and/or referral(s): Orders Placed This Encounter  Procedures  . ToxASSURE Select 13 (MW), Urine   Imaging and/or referral(s): None  Interventional therapies: Planned, scheduled, and/or pending:  None at this time.    Considering:  (Stop Coumadin for 5 days prior to procedure) Left sided suprascapular nerve radiofrequency ablation    Palliative PRN treatment(s):  (Stop Coumadin for 5 days prior to procedure) Suprascapular nerve block  Possible suprascapular nerve radiofrequency ablation    Provider-requested follow-up: Return in about 4 weeks (around 03/25/2018) for MedMgmt.  Future Appointments  Date Time Provider [place]  03/20/2018  9:15 AM Vevelyn Francois, NP [facility] None   Primary Care Physician: Neomia Dear, MD Location: [facility] Note by: Vevelyn Francois NP Date: 02/25/2018; Time: 1:06 PM  Pain Score Disclaimer: We use the NRS-11 scale. This is a self-reported, subjective measurement of pain severity with only modest accuracy. It is used  primarily to identify changes within a particular patient. It must be understood that outpatient pain scales are significantly less accurate that those used for research, where they can be applied under ideal controlled circumstances with minimal exposure to variables. In reality, the score is likely to be a combination of pain intensity and pain affect, where pain affect describes the degree of emotional arousal or changes in action readiness caused by the sensory experience of pain. Factors such as social and work situation, setting, emotional state, anxiety levels, expectation, and prior pain experience may influence pain perception and show large inter-individual differences that may also be affected by time variables.  Patient instructions provided during this appointment: Patient Instructions   _________You have 1 oxycodone script that has been escribed to your pharmacy today. ___________________________________________________________________________________  Medication Rules  Applies to: All patients receiving prescriptions (written or electronic).  Pharmacy of record: Pharmacy where electronic prescriptions will be sent. If written prescriptions are taken to a different pharmacy, please inform the nursing staff. The pharmacy listed in the electronic medical record should be the one where you would like electronic prescriptions to be sent.  Prescription refills: Only during scheduled appointments. Applies to both, written and electronic prescriptions.  NOTE: The following applies primarily to controlled substances (Opioid* Pain Medications).   Patient's responsibilities: 1. Pain Pills: Bring all pain pills to every appointment (except for procedure appointments). 2. Pill Bottles: Bring pills in original pharmacy bottle. Always bring newest bottle. Bring bottle, even if empty. 3. Medication refills: You are responsible for knowing and keeping track of what medications you need refilled.  The day before your appointment, write a list of all prescriptions that need to be refilled. Bring that list to your appointment and give it to the admitting nurse. Prescriptions will be written only during appointments. If you forget a medication, it will not be "Called in", "Faxed", or "electronically sent". You will need to get another appointment to get these  prescribed. 4. Prescription Accuracy: You are responsible for carefully inspecting your prescriptions before leaving our office. Have the discharge nurse carefully go over each prescription with you, before taking them home. Make sure that your name is accurately spelled, that your address is correct. Check the name and dose of your medication to make sure it is accurate. Check the number of pills, and the written instructions to make sure they are clear and accurate. Make sure that you are given enough medication to last until your next medication refill appointment. 5. Taking Medication: Take medication as prescribed. Never take more pills than instructed. Never take medication more frequently than prescribed. Taking less pills or less frequently is permitted and encouraged, when it comes to controlled substances (written prescriptions).  6. Inform other Doctors: Always inform, all of your healthcare providers, of all the medications you take. 7. Pain Medication from other Providers: You are not allowed to accept any additional pain medication from any other Doctor or Healthcare provider. There are two exceptions to this rule. (see below) In the event that you require additional pain medication, you are responsible for notifying us, as stated below. 8. Medication Agreement: You are responsible for carefully reading and following our Medication Agreement. This must be signed before receiving any prescriptions from our practice. Safely store a copy of your signed Agreement. Violations to the Agreement will result in no further prescriptions.  (Additional copies of our Medication Agreement are available upon request.) 9. Laws, Rules, & Regulations: All patients are expected to follow all Federal and [employer], [employer], Rules, [employer]. Ignorance of the Laws does not constitute a valid excuse. The use of any illegal substances is prohibited. 10. Adopted CDC guidelines & recommendations: Target dosing levels will be at or below 60 MME/day. Use of benzodiazepines** is not recommended.  Exceptions: There are only two exceptions to the rule of not receiving pain medications from other Healthcare Providers. 1. Exception #1 (Emergencies): In the event of an emergency (i.e.: accident requiring emergency care), you are allowed to receive additional pain medication. However, you are responsible for: As soon as you are able, call our office [phone], at any time of the day or night, and leave a message stating your name, the date and nature of the emergency, and the name and dose of the medication prescribed. In the event that your call is answered by a member of our staff, make sure to document and save the date, time, and the name of the person that took your information.  2. Exception #2 (Planned Surgery): In the event that you are scheduled by another doctor or dentist to have any type of surgery or procedure, you are allowed (for a period no longer than 30 days), to receive additional pain medication, for the acute post-op pain. However, in this case, you are responsible for picking up a copy of our "Post-op Pain Management for Surgeons" handout, and giving it to your surgeon or dentist. This document is available at our office, and does not require an appointment to obtain it. Simply go to our office during business hours (Monday-Thursday from 8:00 AM to 4:00 PM) (Friday 8:00 AM to 12:00 Noon) or if you have a scheduled appointment with [place], prior to your surgery, and ask for it by name. In addition, you will need to provide [place] with your  name, name of your surgeon, type of surgery, and date of procedure or surgery.  *Opioid medications include: morphine, codeine, oxycodone, oxymorphone, hydrocodone, hydromorphone, meperidine,  tramadol, tapentadol, buprenorphine, fentanyl, methadone. **Benzodiazepine medications include: diazepam (Valium), alprazolam (Xanax), clonazepam (Klonopine), lorazepam (Ativan), clorazepate (Tranxene), chlordiazepoxide (Librium), estazolam (Prosom), oxazepam (Serax), temazepam (Restoril), triazolam (Halcion) (Last updated: 07/26/2017) ____________________________________________________________________________________________   BMI Assessment: Estimated body mass index is 34.97 kg/m as calculated from the following:   Height as of this encounter: '5\' 8"'  (1.727 m).   Weight as of this encounter: 230 lb (104.3 kg).  BMI interpretation table: BMI level Category Range association with higher incidence of chronic pain  <18 kg/m2 Underweight   18.5-24.9 kg/m2 Ideal body weight   25-29.9 kg/m2 Overweight Increased incidence by 20%  30-34.9 kg/m2 Obese (Class I) Increased incidence by 68%  35-39.9 kg/m2 Severe obesity (Class II) Increased incidence by 136%  >40 kg/m2 Extreme obesity (Class III) Increased incidence by 254%   Patient's current BMI Ideal Body weight  Body mass index is 34.97 kg/m. Ideal body weight: 68.4 kg (150 lb 12.7 oz) Adjusted ideal body weight: 82.8 kg (182 lb 7.6 oz)   BMI Readings from Last 4 Encounters:  02/25/18 34.97 kg/m  01/24/18 34.67 kg/m  12/25/17 34.82 kg/m  11/27/17 34.36 kg/m   Wt Readings from Last 4 Encounters:  02/25/18 230 lb (104.3 kg)  01/24/18 228 lb (103.4 kg)  12/25/17 229 lb (103.9 kg)  11/27/17 226 lb (102.5 kg)

## 2018-02-25 NOTE — Progress Notes (Signed)
Nursing Pain Medication Assessment:  Safety precautions to be maintained throughout the outpatient stay will include: orient to surroundings, keep bed in low position, maintain call bell within reach at all times, provide assistance with transfer out of bed and ambulation.  Medication Inspection Compliance: Pill count conducted under aseptic conditions, in front of the patient. Neither the pills nor the bottle was removed from the patient's sight at any time. Once count was completed pills were immediately returned to the patient in their original bottle.  Medication: Oxycodone IR Pill/Patch Count: 0 of 120 pills remain Pill/Patch Appearance: Markings consistent with prescribed medication Bottle Appearance: Standard pharmacy container. Clearly labeled. Filled Date:08 /31 / 2019 Last Medication intake:  Aaron Short

## 2018-02-25 NOTE — Patient Instructions (Addendum)
_________You have 1 oxycodone script that has been escribed to your pharmacy today. ___________________________________________________________________________________  Medication Rules  Applies to: All patients receiving prescriptions (written or electronic).  Pharmacy of record: Pharmacy where electronic prescriptions will be sent. If written prescriptions are taken to a different pharmacy, please inform the nursing staff. The pharmacy listed in the electronic medical record should be the one where you would like electronic prescriptions to be sent.  Prescription refills: Only during scheduled appointments. Applies to both, written and electronic prescriptions.  NOTE: The following applies primarily to controlled substances (Opioid* Pain Medications).   Patient's responsibilities: 1. Pain Pills: Bring all pain pills to every appointment (except for procedure appointments). 2. Pill Bottles: Bring pills in original pharmacy bottle. Always bring newest bottle. Bring bottle, even if empty. 3. Medication refills: You are responsible for knowing and keeping track of what medications you need refilled. The day before your appointment, write a list of all prescriptions that need to be refilled. Bring that list to your appointment and give it to the admitting nurse. Prescriptions will be written only during appointments. If you forget a medication, it will not be "Called in", "Faxed", or "electronically sent". You will need to get another appointment to get these prescribed. 4. Prescription Accuracy: You are responsible for carefully inspecting your prescriptions before leaving our office. Have the discharge nurse carefully go over each prescription with you, before taking them home. Make sure that your name is accurately spelled, that your address is correct. Check the name and dose of your medication to make sure it is accurate. Check the number of pills, and the written instructions to make sure they are  clear and accurate. Make sure that you are given enough medication to last until your next medication refill appointment. 5. Taking Medication: Take medication as prescribed. Never take more pills than instructed. Never take medication more frequently than prescribed. Taking less pills or less frequently is permitted and encouraged, when it comes to controlled substances (written prescriptions).  6. Inform other Doctors: Always inform, all of your healthcare providers, of all the medications you take. 7. Pain Medication from other Providers: You are not allowed to accept any additional pain medication from any other Doctor or Healthcare provider. There are two exceptions to this rule. (see below) In the event that you require additional pain medication, you are responsible for notifying us, as stated below. 8. Medication Agreement: You are responsible for carefully reading and following our Medication Agreement. This must be signed before receiving any prescriptions from our practice. Safely store a copy of your signed Agreement. Violations to the Agreement will result in no further prescriptions. (Additional copies of our Medication Agreement are available upon request.) 9. Laws, Rules, & Regulations: All patients are expected to follow all Federal and [employer], [employer], Rules, [employer]. Ignorance of the Laws does not constitute a valid excuse. The use of any illegal substances is prohibited. 10. Adopted CDC guidelines & recommendations: Target dosing levels will be at or below 60 MME/day. Use of benzodiazepines** is not recommended.  Exceptions: There are only two exceptions to the rule of not receiving pain medications from other Healthcare Providers. 1. Exception #1 (Emergencies): In the event of an emergency (i.e.: accident requiring emergency care), you are allowed to receive additional pain medication. However, you are responsible for: As soon as you are able, call our office [phone],  at any time of the day or night, and leave a message stating your name, the date  and nature of the emergency, and the name and dose of the medication prescribed. In the event that your call is answered by a member of our staff, make sure to document and save the date, time, and the name of the person that took your information.  2. Exception #2 (Planned Surgery): In the event that you are scheduled by another doctor or dentist to have any type of surgery or procedure, you are allowed (for a period no longer than 30 days), to receive additional pain medication, for the acute post-op pain. However, in this case, you are responsible for picking up a copy of our "Post-op Pain Management for Surgeons" handout, and giving it to your surgeon or dentist. This document is available at our office, and does not require an appointment to obtain it. Simply go to our office during business hours (Monday-Thursday from 8:00 AM to 4:00 PM) (Friday 8:00 AM to 12:00 Noon) or if you have a scheduled appointment with [place], prior to your surgery, and ask for it by name. In addition, you will need to provide [place] with your name, name of your surgeon, type of surgery, and date of procedure or surgery.  *Opioid medications include: morphine, codeine, oxycodone, oxymorphone, hydrocodone, hydromorphone, meperidine, tramadol, tapentadol, buprenorphine, fentanyl, methadone. **Benzodiazepine medications include: diazepam (Valium), alprazolam (Xanax), clonazepam (Klonopine), lorazepam (Ativan), clorazepate (Tranxene), chlordiazepoxide (Librium), estazolam (Prosom), oxazepam (Serax), temazepam (Restoril), triazolam (Halcion) (Last updated: 07/26/2017) ____________________________________________________________________________________________   BMI Assessment: Estimated body mass index is 34.97 kg/m as calculated from the following:   Height as of this encounter: 5\' 8"  (1.727 m).   Weight as of this encounter: 230 lb (104.3 kg).  BMI  interpretation table: BMI level Category Range association with higher incidence of chronic pain  <18 kg/m2 Underweight   18.5-24.9 kg/m2 Ideal body weight   25-29.9 kg/m2 Overweight Increased incidence by 20%  30-34.9 kg/m2 Obese (Class I) Increased incidence by 68%  35-39.9 kg/m2 Severe obesity (Class II) Increased incidence by 136%  >40 kg/m2 Extreme obesity (Class III) Increased incidence by 254%   Patient's current BMI Ideal Body weight  Body mass index is 34.97 kg/m. Ideal body weight: 68.4 kg (150 lb 12.7 oz) Adjusted ideal body weight: 82.8 kg (182 lb 7.6 oz)   BMI Readings from Last 4 Encounters:  02/25/18 34.97 kg/m  01/24/18 34.67 kg/m  12/25/17 34.82 kg/m  11/27/17 34.36 kg/m   Wt Readings from Last 4 Encounters:  02/25/18 230 lb (104.3 kg)  01/24/18 228 lb (103.4 kg)  12/25/17 229 lb (103.9 kg)  11/27/17 226 lb (102.5 kg)

## 2018-03-02 LAB — TOXASSURE SELECT 13 (MW), URINE

## 2018-03-08 DIAGNOSIS — M62838 Other muscle spasm: Secondary | ICD-10-CM | POA: Diagnosis not present

## 2018-03-08 DIAGNOSIS — Z23 Encounter for immunization: Secondary | ICD-10-CM | POA: Diagnosis not present

## 2018-03-08 DIAGNOSIS — M6240 Contracture of muscle, unspecified site: Secondary | ICD-10-CM | POA: Diagnosis not present

## 2018-03-08 DIAGNOSIS — E119 Type 2 diabetes mellitus without complications: Secondary | ICD-10-CM | POA: Diagnosis not present

## 2018-03-20 ENCOUNTER — Encounter: Payer: Medicare Other | Admitting: Nurse Practitioner

## 2018-03-27 ENCOUNTER — Ambulatory Visit: Payer: Medicare Other | Attending: Nurse Practitioner | Admitting: Nurse Practitioner

## 2018-03-27 ENCOUNTER — Encounter: Payer: Self-pay | Admitting: Nurse Practitioner

## 2018-03-27 VITALS — BP 132/76 | HR 41 | Resp 16 | Ht 68.0 in | Wt 230.0 lb

## 2018-03-27 DIAGNOSIS — Z7901 Long term (current) use of anticoagulants: Secondary | ICD-10-CM | POA: Diagnosis not present

## 2018-03-27 DIAGNOSIS — Z96643 Presence of artificial hip joint, bilateral: Secondary | ICD-10-CM | POA: Diagnosis not present

## 2018-03-27 DIAGNOSIS — M25512 Pain in left shoulder: Secondary | ICD-10-CM | POA: Diagnosis present

## 2018-03-27 DIAGNOSIS — M19011 Primary osteoarthritis, right shoulder: Secondary | ICD-10-CM | POA: Diagnosis not present

## 2018-03-27 DIAGNOSIS — Z888 Allergy status to other drugs, medicaments and biological substances status: Secondary | ICD-10-CM | POA: Diagnosis not present

## 2018-03-27 DIAGNOSIS — Z6834 Body mass index (BMI) 34.0-34.9, adult: Secondary | ICD-10-CM | POA: Diagnosis not present

## 2018-03-27 DIAGNOSIS — Z96653 Presence of artificial knee joint, bilateral: Secondary | ICD-10-CM | POA: Insufficient documentation

## 2018-03-27 DIAGNOSIS — Z79891 Long term (current) use of opiate analgesic: Secondary | ICD-10-CM | POA: Insufficient documentation

## 2018-03-27 DIAGNOSIS — I10 Essential (primary) hypertension: Secondary | ICD-10-CM | POA: Diagnosis not present

## 2018-03-27 DIAGNOSIS — E119 Type 2 diabetes mellitus without complications: Secondary | ICD-10-CM | POA: Insufficient documentation

## 2018-03-27 DIAGNOSIS — I4891 Unspecified atrial fibrillation: Secondary | ICD-10-CM | POA: Diagnosis not present

## 2018-03-27 DIAGNOSIS — Z79899 Other long term (current) drug therapy: Secondary | ICD-10-CM | POA: Insufficient documentation

## 2018-03-27 DIAGNOSIS — Z880 Allergy status to penicillin: Secondary | ICD-10-CM | POA: Insufficient documentation

## 2018-03-27 DIAGNOSIS — Z8249 Family history of ischemic heart disease and other diseases of the circulatory system: Secondary | ICD-10-CM | POA: Insufficient documentation

## 2018-03-27 DIAGNOSIS — Z818 Family history of other mental and behavioral disorders: Secondary | ICD-10-CM | POA: Diagnosis not present

## 2018-03-27 DIAGNOSIS — M19012 Primary osteoarthritis, left shoulder: Secondary | ICD-10-CM | POA: Diagnosis not present

## 2018-03-27 DIAGNOSIS — M7918 Myalgia, other site: Secondary | ICD-10-CM

## 2018-03-27 DIAGNOSIS — G894 Chronic pain syndrome: Secondary | ICD-10-CM | POA: Insufficient documentation

## 2018-03-27 DIAGNOSIS — Z8551 Personal history of malignant neoplasm of bladder: Secondary | ICD-10-CM | POA: Insufficient documentation

## 2018-03-27 DIAGNOSIS — M62838 Other muscle spasm: Secondary | ICD-10-CM | POA: Diagnosis not present

## 2018-03-27 DIAGNOSIS — M47816 Spondylosis without myelopathy or radiculopathy, lumbar region: Secondary | ICD-10-CM | POA: Diagnosis not present

## 2018-03-27 DIAGNOSIS — Z5181 Encounter for therapeutic drug level monitoring: Secondary | ICD-10-CM | POA: Insufficient documentation

## 2018-03-27 DIAGNOSIS — E782 Mixed hyperlipidemia: Secondary | ICD-10-CM | POA: Diagnosis not present

## 2018-03-27 DIAGNOSIS — Z8781 Personal history of (healed) traumatic fracture: Secondary | ICD-10-CM | POA: Diagnosis not present

## 2018-03-27 DIAGNOSIS — F17210 Nicotine dependence, cigarettes, uncomplicated: Secondary | ICD-10-CM | POA: Insufficient documentation

## 2018-03-27 MED ORDER — OXYCODONE HCL 5 MG PO TABS: 5.0000 mg | ORAL_TABLET | Freq: Four times a day (QID) | ORAL | 0 refills | Status: DC | PRN

## 2018-03-27 NOTE — Progress Notes (Signed)
Nursing Pain Medication Assessment:  Safety precautions to be maintained throughout the outpatient stay will include: orient to surroundings, keep bed in low position, maintain call bell within reach at all times, provide assistance with transfer out of bed and ambulation.  Medication Inspection Compliance: Pill count conducted under aseptic conditions, in front of the patient. Neither the pills nor the bottle was removed from the patient's sight at any time. Once count was completed pills were immediately returned to the patient in their original bottle.  Medication: Oxycodone IR Pill/Patch Count: 0 of 120 pills remain Pill/Patch Appearance: Markings consistent with prescribed medication Bottle Appearance: Standard pharmacy container. Clearly labeled. Filled Date: 09 / 30 / 2019 Last Medication intake:  Yesterday

## 2018-03-27 NOTE — Patient Instructions (Signed)
____________________________________________________________________________________________  Medication Rules  Applies to: All patients receiving prescriptions (written or electronic).  Pharmacy of record: Pharmacy where electronic prescriptions will be sent. If written prescriptions are taken to a different pharmacy, please inform the nursing staff. The pharmacy listed in the electronic medical record should be the one where you would like electronic prescriptions to be sent.  Prescription refills: Only during scheduled appointments. Applies to both, written and electronic prescriptions.  NOTE: The following applies primarily to controlled substances (Opioid* Pain Medications).   Patient's responsibilities: 1. Pain Pills: Bring all pain pills to every appointment (except for procedure appointments). 2. Pill Bottles: Bring pills in original pharmacy bottle. Always bring newest bottle. Bring bottle, even if empty. 3. Medication refills: You are responsible for knowing and keeping track of what medications you need refilled. The day before your appointment, write a list of all prescriptions that need to be refilled. Bring that list to your appointment and give it to the admitting nurse. Prescriptions will be written only during appointments. If you forget a medication, it will not be "Called in", "Faxed", or "electronically sent". You will need to get another appointment to get these prescribed. 4. Prescription Accuracy: You are responsible for carefully inspecting your prescriptions before leaving our office. Have the discharge nurse carefully go over each prescription with you, before taking them home. Make sure that your name is accurately spelled, that your address is correct. Check the name and dose of your medication to make sure it is accurate. Check the number of pills, and the written instructions to make sure they are clear and accurate. Make sure that you are given enough medication to last  until your next medication refill appointment. 5. Taking Medication: Take medication as prescribed. Never take more pills than instructed. Never take medication more frequently than prescribed. Taking less pills or less frequently is permitted and encouraged, when it comes to controlled substances (written prescriptions).  6. Inform other Doctors: Always inform, all of your healthcare providers, of all the medications you take. 7. Pain Medication from other Providers: You are not allowed to accept any additional pain medication from any other Doctor or Healthcare provider. There are two exceptions to this rule. (see below) In the event that you require additional pain medication, you are responsible for notifying us, as stated below. 8. Medication Agreement: You are responsible for carefully reading and following our Medication Agreement. This must be signed before receiving any prescriptions from our practice. Safely store a copy of your signed Agreement. Violations to the Agreement will result in no further prescriptions. (Additional copies of our Medication Agreement are available upon request.) 9. Laws, Rules, & Regulations: All patients are expected to follow all Federal and State Laws, Statutes, Rules, & Regulations. Ignorance of the Laws does not constitute a valid excuse. The use of any illegal substances is prohibited. 10. Adopted CDC guidelines & recommendations: Target dosing levels will be at or below 60 MME/day. Use of benzodiazepines** is not recommended.  Exceptions: There are only two exceptions to the rule of not receiving pain medications from other Healthcare Providers. 1. Exception #1 (Emergencies): In the event of an emergency (i.e.: accident requiring emergency care), you are allowed to receive additional pain medication. However, you are responsible for: As soon as you are able, call our office [phone], at any time of the day or night, and leave a message stating your name, the  date and nature of the emergency, and the name and dose of the medication   prescribed. In the event that your call is answered by a member of our staff, make sure to document and save the date, time, and the name of the person that took your information.  2. Exception #2 (Planned Surgery): In the event that you are scheduled by another doctor or dentist to have any type of surgery or procedure, you are allowed (for a period no longer than 30 days), to receive additional pain medication, for the acute post-op pain. However, in this case, you are responsible for picking up a copy of our "Post-op Pain Management for Surgeons" handout, and giving it to your surgeon or dentist. This document is available at our office, and does not require an appointment to obtain it. Simply go to our office during business hours (Monday-Thursday from 8:00 AM to 4:00 PM) (Friday 8:00 AM to 12:00 Noon) or if you have a scheduled appointment with us, prior to your surgery, and ask for it by name. In addition, you will need to provide us with your name, name of your surgeon, type of surgery, and date of procedure or surgery.  *Opioid medications include: morphine, codeine, oxycodone, oxymorphone, hydrocodone, hydromorphone, meperidine, tramadol, tapentadol, buprenorphine, fentanyl, methadone. **Benzodiazepine medications include: diazepam (Valium), alprazolam (Xanax), clonazepam (Klonopine), lorazepam (Ativan), clorazepate (Tranxene), chlordiazepoxide (Librium), estazolam (Prosom), oxazepam (Serax), temazepam (Restoril), triazolam (Halcion) (Last updated: 07/26/2017) ____________________________________________________________________________________________    

## 2018-03-27 NOTE — Progress Notes (Signed)
Patient's Name: Aaron Short  MRN: [id number]  Referring Provider: Neomia Dear, MD  DOB: [birth year]  PCP: Neomia Dear, MD  DOS: 03/27/2018  Note by: Vevelyn Francois NP  Service setting: Ambulatory outpatient  Specialty: Interventional Pain Management  Location: [facility] (AMB) Pain Management Facility    Patient type: Established    Primary Reason(s) for Visit: Encounter for prescription drug management. (Level of risk: moderate)  CC: Shoulder Pain (left ) and Back Pain (lumbar bilateral )  HPI  Aaron Short is a 72 y.o. year old, male patient, who comes today for a medication management evaluation. He has Long term current use of opiate analgesic; Long term prescription opiate use; Opiate use (97.5 MME/Day); Encounter for therapeutic drug level monitoring; Encounter for chronic pain management; Opioid dependence, daily use ([place]); Chronic hip pain (Location of Secondary source of pain) (Bilateral) (R>L); Chronic shoulder pain (Location of Primary Source of Pain) (Bilateral) (L>R); Chronic low back pain (Location of Tertiary source of pain) (Bilateral) (R>L); Chronic knee pain (Bilateral) (R>L); S/P THR: total hip replacement (Bilateral); S/P TKR: total knee replacement (Bilateral); Atrial fibrillation ([place]); Malignant neoplasm of urinary bladder ([place]); Type 2 diabetes mellitus ([place]); Muscle spasticity; Coumadin anticoagulation; Mixed hyperlipidemia; Pure hypercholesterolemia; History of bladder cancer; History of hip fracture; History of pelvic fracture; Long-term use of high-risk medication; Chronic neck pain; Chondrocalcinosis; Chronic shoulder pain ([place]); History of shoulder surgery; Myofascial pain; Musculoskeletal pain; Muscle spasm; Lumbar facet syndrome (Bilateral) (R>L); Lumbar spondylosis; Morbid obesity ([place]); Continuous opioid dependence ([place]); Disturbance of skin sensation; Malignant neoplasm of bladder ([place]); Smoker; FH: atrial fibrillation; Malignant neoplasm of  overlapping sites of bladder ([place]); Obesity, Class I, BMI 30-34.9; Chronic pain syndrome; Hypertension, benign; Impaired fasting glucose; Chronic acromioclavicular joint pain (Left); Osteoarthritis of shoulder (Bilateral) (L>R); 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 His primarily concern today is the Shoulder Pain (left ) and Back Pain (lumbar bilateral )  Pain Assessment: Location: Left Shoulder(back lumbar bilateral ) Radiating: shoulder pain goes down the arm into the hand, dull pain.  back is more sharp pain when stressed  Onset: More than a month ago Duration: Chronic pain Quality: Discomfort, Numbness, Tingling, Dull, Sharp, Constant Severity: 1 /10 (subjective, self-reported pain score)  Note: Reported level is compatible with observation.                          Effect on ADL: pain with ROM in the shoulder. unable to sleep on that left side.  increased back pain with stressors  Timing: Constant Modifying factors: medications, exercise BP: 132/76  HR: (!) 41  Aaron Short was last scheduled for an appointment on 02/25/2018 for medication management. During today's appointment we reviewed Aaron Short chronic pain status, as well as his outpatient medication regimen.  He admits that his pain is stable.  He denies any concerns today.  He denies any side effects of his current medication regimen.  The patient  reports that he does not use drugs. His body mass index is 34.97 kg/m.  Further details on both, my assessment(s), as well as the proposed treatment plan, please see below.  Controlled Substance Pharmacotherapy Assessment REMS (Risk Evaluation and Mitigation Strategy)  Analgesic:Oxycodone IR 5 mg every  6hours (5 mg/dayof oxycodone)  MME:22.77mq per day PJanett Billow RN  03/27/2018  8:26 AM  Sign at close encounter Nursing Pain Medication Assessment:  Safety precautions to be maintained throughout the outpatient stay will include: orient to surroundings, keep bed in low position, maintain call bell within reach at all times, provide assistance with transfer out of bed and ambulation.  Medication Inspection Compliance: Pill count conducted under aseptic conditions, in front of the patient. Neither the pills nor the bottle was removed from the patient's sight at any time. Once count was completed pills were immediately returned to the patient in their original bottle.  Medication: Oxycodone IR Pill/Patch Count: 0 of 120 pills remain Pill/Patch Appearance: Markings consistent with prescribed medication Bottle Appearance: Standard pharmacy container. Clearly labeled. Filled Date: 09 / 30 / 2019 Last Medication intake:  Yesterday   Pharmacokinetics: Liberation and absorption (onset of action): WNL Distribution (time to peak effect): WNL Metabolism and excretion (duration of action): WNL         Pharmacodynamics: Desired effects: Analgesia: Aaron Short reports >50% benefit. Functional ability: Patient reports that medication allows him to accomplish basic ADLs Clinically meaningful improvement in function (CMIF): Sustained CMIF goals met Perceived effectiveness: Described as relatively effective, allowing for increase in activities of daily living (ADL) Undesirable effects: Side-effects or Adverse reactions: None reported Monitoring: [place] PMP: Online review of the past 19-monthperiod conducted. Compliant with practice rules and regulations Last UDS on record: Summary  Date Value Ref Range Status  02/25/2018 FINAL  Final    Comment:    ==================================================================== TOXASSURE SELECT 13  (MW) ==================================================================== Test                             Result       Flag       Units Drug Present and Declared for Prescription Verification   Oxycodone                      1586         EXPECTED   ng/mg creat   Oxymorphone                    2057         EXPECTED   ng/mg creat   Noroxycodone                   1443         EXPECTED   ng/mg creat   Noroxymorphone                 638          EXPECTED   ng/mg creat    Sources of oxycodone are scheduled prescription medications.    Oxymorphone, noroxycodone, and noroxymorphone are expected    metabolites of oxycodone. Oxymorphone is also available as a    scheduled prescription medication. ==================================================================== Test                      Result    Flag   Units      Ref Range   Creatinine              167              mg/dL      >=20 ==================================================================== Declared Medications:  The flagging and interpretation on this report are based on the  following declared medications.  Unexpected results may arise from  inaccuracies in the declared  medications.  **Note: The testing scope of this panel includes these medications:  Oxycodone  **Note: The testing scope of this panel does not include following  reported medications:  Acetaminophen  Carisoprodol  Cetirizine  Digoxin  Furosemide  Melatonin  Metoprolol  Naloxone  Rivaroxaban  Rosuvastatin  Simvastatin  Triamcinolone acetonide ==================================================================== For clinical consultation, please call [phone]. ====================================================================    UDS interpretation: Compliant          Medication Assessment Form: Reviewed. Patient indicates being compliant with therapy Treatment compliance: Compliant Risk Assessment Profile: Aberrant behavior: See prior  evaluations. None observed or detected today Comorbid factors increasing risk of overdose: See prior notes. No additional risks detected today Opioid risk tool (ORT) (Total Score):   Personal History of Substance Abuse (SUD-Substance use disorder):  Alcohol:    Illegal Drugs:    Rx Drugs:    ORT Risk Level calculation:   Risk of substance use disorder (SUD): Low  ORT Scoring interpretation table:  Score <3 = Low Risk for SUD  Score between 4-7 = Moderate Risk for SUD  Score >8 = High Risk for Opioid Abuse   Risk Mitigation Strategies:  Patient Counseling: Covered Patient-Prescriber Agreement (PPA): Present and active  Notification to other healthcare providers: Done  Pharmacologic Plan: No change in therapy, at this time.             Laboratory Chemistry  Inflammation Markers (CRP: Acute Phase) (ESR: Chronic Phase) Lab Results  Component Value Date   CRP <0.5 11/18/2015   ESRSEDRATE 6 11/18/2015                         Rheumatology Markers No results found for: RF, ANA, LABURIC, URICUR, LYMEIGGIGMAB, LYMEABIGMQN, HLAB27                      Renal Function Markers Lab Results  Component Value Date   BUN 15 11/18/2015   CREATININE 0.80 11/18/2015   GFRAA >60 11/18/2015   GFRNONAA >60 11/18/2015                             Hepatic Function Markers Lab Results  Component Value Date   AST 19 11/18/2015   ALT 17 11/18/2015   ALBUMIN 4.1 11/18/2015   ALKPHOS 68 11/18/2015                        Electrolytes Lab Results  Component Value Date   NA 139 11/18/2015   K 4.3 11/18/2015   CL 103 11/18/2015   CALCIUM 9.3 11/18/2015   MG 1.8 11/18/2015                        Neuropathy Markers Lab Results  Component Value Date   VITAMINB12 268 11/18/2015                        CNS Tests No results found for: COLORCSF, APPEARCSF, RBCCOUNTCSF, WBCCSF, POLYSCSF, LYMPHSCSF, EOSCSF, PROTEINCSF, GLUCCSF, JCVIRUS, CSFOLI, IGGCSF                      Bone Pathology  Markers Lab Results  Component Value Date   25OHVITD1 31 11/18/2015   25OHVITD2 <1.0 11/18/2015   25OHVITD3 30 11/18/2015  Coagulation Parameters No results found for: INR, LABPROT, APTT, PLT, DDIMER                      Cardiovascular Markers No results found for: BNP, CKTOTAL, CKMB, TROPONINI, HGB, HCT                       CA Markers No results found for: CEA, CA125, LABCA2                      Note: Lab results reviewed.  Recent Diagnostic Imaging Results  DG Lumbar Spine Complete W/Bend CLINICAL DATA:  Low back pain for 1 month radiating down LEFT leg, injured back lifting something heavy, lumbar spondylosis, lumbar facet syndrome  EXAM: LUMBAR SPINE - COMPLETE WITH BENDING VIEWS  COMPARISON:  11/18/2015  FINDINGS: Osseous demineralization.  Five non-rib-bearing lumbar vertebra.  Multilevel endplate spur formation throughout lumbar spine.  Minimal disc space narrowing L4-L5.  Vertebral body heights maintained without fracture or bone destruction.  Minimal retrolisthesis at L5-S1 approximately 3 mm which increases to 6 mm with extension and appears unchanged with flexion.  Remaining alignments normal it out abnormal motion with flexion or extension.  SI joints preserved.  BILATERAL hip prostheses.  IMPRESSION: Scattered degenerative disc disease changes of the lumbar spine.  Minimal retrolisthesis at L5-S1 which mildly increases with extension and is unchanged with flexion.  Electronically Signed   By: Lavonia Dana M.D.   On: 11/08/2017 11:02  Complexity Note: Imaging results reviewed. Results shared with Aaron Short, using Layman's terms.                         Meds   Current Outpatient Medications:  .  acetaminophen (TYLENOL) 500 MG tablet, Take 500 mg by mouth every 8 (eight) hours as needed., Disp: , Rfl:  .  carisoprodol (SOMA) 350 MG tablet, Take 350 mg by mouth 2 (two) times daily. , Disp: , Rfl:  .   Cetirizine HCl (ZYRTEC ALLERGY) 10 MG CAPS, Take 1 daily for itching, Disp: 30 capsule, Rfl: 0 .  digoxin (DIGOX) 0.125 MG tablet, TAKE 1 TABLET BY MOUTH EVERY DAY, Disp: , Rfl:  .  metoprolol succinate (TOPROL-XL) 25 MG 24 hr tablet, Take 25 mg by mouth., Disp: , Rfl:  .  Naloxone HCl (NARCAN) 4 MG/0.1ML LIQD, Place 1 spray into the nose once. Spray half of bottle content into each nostril, then call 911, Disp: 2 each, Rfl: 0 .  [START ON 04/26/2018] oxyCODONE (OXY IR/ROXICODONE) 5 MG immediate release tablet, Take 1 tablet (5 mg total) by mouth every 6 (six) hours as needed for severe pain., Disp: 120 tablet, Rfl: 0 .  rivaroxaban (XARELTO) 20 MG TABS tablet, Take 20 mg by mouth., Disp: , Rfl:  .  simvastatin (ZOCOR) 40 MG tablet, Take 40 mg by mouth at bedtime. , Disp: , Rfl:  .  triamcinolone cream (KENALOG) 0.1 %, Apply 1 application topically 2 (two) times daily., Disp: 30 g, Rfl: 0 .  furosemide (LASIX) 20 MG tablet, TAKE 1 TABLET(20 MG) BY MOUTH DAILY AS NEEDED FOR SWELLING, Disp: , Rfl:  .  metoprolol succinate (TOPROL-XL) 25 MG 24 hr tablet, Take 25 mg by mouth., Disp: , Rfl:  .  oxyCODONE (OXY IR/ROXICODONE) 5 MG immediate release tablet, Take 1 tablet (5 mg total) by mouth every 6 (six) hours as needed for severe pain., Disp: 120 tablet, Rfl:  0 .  rosuvastatin (CRESTOR) 20 MG tablet, Take 20 mg by mouth daily. , Disp: , Rfl:   ROS  Constitutional: Denies any fever or chills Gastrointestinal: No reported hemesis, hematochezia, vomiting, or acute GI distress Musculoskeletal: Denies any acute onset joint swelling, redness, loss of ROM, or weakness Neurological: No reported episodes of acute onset apraxia, aphasia, dysarthria, agnosia, amnesia, paralysis, loss of coordination, or loss of consciousness  Allergies  Aaron Short is allergic to diltiazem; penicillins; and tizanidine.  PFSH  Drug: Aaron Short  reports that he does not use drugs. Alcohol:  reports that he does not  drink alcohol. Tobacco:  reports that he has been smoking cigarettes. He has been smoking about 0.50 packs per day. He has never used smokeless tobacco. Medical:  has a past medical history of Arthralgia of lower leg (04/02/2012), Arthralgia of upper arm (06/18/2009), Cancer ([place]), Diabetes mellitus without complication ([place]), FH: atrial fibrillation, and Hypertension. Surgical: Aaron Short  has a past surgical history that includes bladder cancer surgery (2015); Joint replacement; Replacement total hip w/  resurfacing implants (Bilateral); Total knee arthroplasty (Bilateral); and Shoulder surgery (Bilateral). Family: family history includes Dementia in his mother; Heart disease in his father.  Constitutional Exam  General appearance: Well nourished, well developed, and well hydrated. In no apparent acute distress Vitals:   03/27/18 0817  BP: 132/76  Pulse: (!) 41  Resp: 16  SpO2: 94%  Weight: 230 lb (104.3 kg)  Height: '5\' 8"'  (1.727 m)   BMI Psych/Mental status: Alert, oriented x 3 (person, place, & time)       Eyes: PERLA Respiratory: No evidence of acute respiratory distress  Lumbar Spine Area Exam  Skin & Axial Inspection: No masses, redness, or swelling Alignment: Symmetrical Functional ROM: Unrestricted ROM       Stability: No instability detected Muscle Tone/Strength: Functionally intact. No obvious neuro-muscular anomalies detected. Sensory (Neurological): Unimpaired Palpation: No palpable anomalies        Gait & Posture Assessment  Ambulation: Unassisted Gait: Relatively normal for age and body habitus Posture: WNL   Lower Extremity Exam    Side: Right lower extremity  Side: Left lower extremity  Stability: No instability observed          Stability: No instability observed          Skin & Extremity Inspection: Skin color, temperature, and hair growth are WNL. No peripheral edema or cyanosis. No masses, redness, swelling, asymmetry, or associated skin lesions. No  contractures.  Skin & Extremity Inspection: Skin color, temperature, and hair growth are WNL. No peripheral edema or cyanosis. No masses, redness, swelling, asymmetry, or associated skin lesions. No contractures.  Functional ROM: Unrestricted ROM                  Functional ROM: Unrestricted ROM                  Muscle Tone/Strength: Functionally intact. No obvious neuro-muscular anomalies detected.  Muscle Tone/Strength: Functionally intact. No obvious neuro-muscular anomalies detected.  Sensory (Neurological): Unimpaired  Sensory (Neurological): Unimpaired  Palpation: No palpable anomalies  Palpation: No palpable anomalies   Assessment  Primary Diagnosis & Pertinent Problem List: The primary encounter diagnosis was Osteoarthritis of shoulder (Bilateral) (L>R). Diagnoses of Lumbar spondylosis, Myofascial pain, Musculoskeletal pain, and Chronic pain syndrome were also pertinent to this visit.  Status Diagnosis  Controlled Controlled Controlled 1. Osteoarthritis of shoulder (Bilateral) (L>R)   2. Lumbar spondylosis   3. Myofascial pain   4.  Musculoskeletal pain   5. Chronic pain syndrome     Problems updated and reviewed during this visit: Problem  Muscle Spasm   Last Assessment & Plan:  See note on involuntary muscle contractions.  Last Assessment & Plan:  See involuntary muscle contractions tab.   Atrial Fibrillation (Hcc)   sees by Dr. Newman Pies at [facility] regularly, on warfarin w/stable INRs and digoxin low dose.   Last Assessment & Plan:  INR at goal today at 2. Cont same dose 4 mg (2 mg x 2) on Tue, Thu, Sat; 3 mg (3 mg x 1) all other days r'd warfarin Next INR 4 wks  Overview:  sees by Dr. Newman Pies at [facility] regularly, on warfarin w/stable INRs and digoxin low dose.   Overview:  sees by Dr. Newman Pies at [facility] regularly, on warfarin w/stable INRs and digoxin low dose.   Last Assessment & Plan:  INR at goal today at 2. Cont same dose 4 mg (2 mg x 2) on Tue, Thu, Sat; 3 mg (3 mg x  1) all other days r'd warfarin Next INR 4 wks  Last Assessment & Plan:  Formatting of this note may be different from the original. He sees [facility] yearly and will schedule a visit with them soon, but I will continue to manage his INRs on warfarin and prescribe digoxin at the guidance of his cardiologist.  INR therapeutic 01/26/16 Lab Results  Component Value Date   INR 2.9 01/26/2016   INR 2.4 12/29/2015   INR 2.5 12/01/2015   sees by Dr. Newman Pies at [facility] regularly, on warfarin w/stable INRs and digoxin low dose.   Overview:  sees by Dr. Newman Pies at [facility] regularly, on warfarin w/stable INRs and digoxin low dose.   Last Assessment & Plan:  INR at goal today at 2. Cont same dose 4 mg (2 mg x 2) on Tue, Thu, Sat; 3 mg (3 mg x 1) all other days r'd warfarin Next INR 4 wks  Last Assessment & Plan:  Mgmt by Dr. Newman Pies at [facility]. Now off warfarin, on Xarelto, also on digoxin, metrolol for rate control. Recently he requested a digoxin rf from me and I declined, stating this should be rx'd by his cardiologist (who prescribes his crestor, xarelto, lasix, Toprol etc), BUT pt called back saying his cardiologist told him I should refill it, which I did. Today we discussed that I am happy to provide routine refills as long as he keeps his regular visits w/cardiology so that I can see that they still want him to take this med and at what dose Overview:  Paroxysmal.  Atrial fibrillation and flutter diagnosed 2010 pre-op from knee surgery.  No history of anti-arrhythmic drugs or cardioversion.   sees by Dr. Newman Pies at [facility] regularly, on warfarin w/stable INRs and digoxin low dose.   Changed from warfarin to xarelto 2018 by [facility]  Last Assessment & Plan:  Mgmt by Dr. Newman Pies at [facility]. Now off warfarin, on Xarelto, also on digoxin, metrolol for rate control. Recently he requested a digoxin rf from me and I declined, stating this should be rx'd by his cardiologist (who prescribes his  crestor, xarelto, lasix, Toprol etc), BUT pt called back saying his cardiologist told him I should refill it, which I did. Today we discussed that I am happy to provide routine refills as long as he keeps his regular visits w/cardiology so that I can see that they still want him to take this med and at  what dose   Morbid Obesity (Hcc)   Last Assessment & Plan:  Weight increasing. Check TSH. Urged wt loss. Discussed diet/exerrcise.  Last Assessment & Plan:  BMI 35.81 with weight of 239 lbs. Encourage pt continue working on healthy diet and regular exercise. Recommend a minimum of 2.5 hours/wk of physical activity.  Last Assessment & Plan:  BMI 35.74 with weight of 235 lbs. Encourage pt continue working on healthy diet and regular exercise. Recommend a minimum of 2.5 hours/wk of physical activity. Offered referral to Dr. Florina Ou weight loss clinic and RD, Ellie for dietary counseling- he will consider this but declines today in favor of lifestyle modifications.   Impaired Fasting Glucose   Last Assessment & Plan:  See diabetes tab.   On statin/ARB. No ASA given warfarin use  Last Assessment & Plan:  Disease status: well controlled, A1cs in prediabetes range, off meds  A1c goal: <8.0%; Pt is at goal. A1c 6.2 today from 6.0 on 02/2017   Not currently on any medications for this  Advice pt included: Encourage weight loss via healthy diet and regular exercise. Offered referral to nutritionist, pt declined. Discussed referral to Dr. Florina Ou obesity clinic, pt will think about this. Urine microalbumin collected. DM HM otherwise UTD.  Checking non-fasted labs today  On statin/ARB. No ASA given warfarin use  Last Assessment & Plan:  Disease status: well controlled, A1cs in prediabetes range, off meds  A1c goal: <8.0%; Pt is at goal. A1c 6.3 today from 6.2 on 08/2017   Not currently on any medications for this   Recommended using sugar substitutes at home   Informed pt that he will be due for DM  eye exam next month Advice pt included: Encourage weight loss via healthy diet and regular exercise. Offered referral to nutritionist, pt declined. Discussed referral to Dr. Florina Ou obesity clinic, pt will think about this. DM HM otherwise UTD.   Involuntary Muscle Contractions   Last Assessment & Plan:  Pt tolerating Soma 367m well, continued this. 3 30d rxs escribed (90d total).  Advised pt not to mix rx with ETOH or other medications that promote drowsiness. Drug screen utd (09/2016).Contract updated on 03/31/2017. PDMP showed he last filled his Soma on 06/02/2017 and showed appropriate use. He conts to be prescribed 575moxycodone from Dr. NaHarl Favorut of [place] pain [facility], they are apparently gradually tapering down his chronic opioid-encouraged pt regarding this.   Last Assessment & Plan:  Pt tolerating Soma 35060mell, continued this. Note written to pts pharmacy requesting rx be filled today (09/27/2017=2d early) given [place] not to mix rx with ETOH or other medications that promote drowsiness. UDS collected today. Pt reports he accidentally took the medications from his wife's pill box on 09/26/2017. Pts wife takes bupropion and oxycodone so these meds may affect UDS results.Contract updated on 03/31/2017.PDMP showed he last filled Soma 350m69m 08/30/2017. He continues [place] 5mg 41[place]  Last Assessment & Plan:  Pt tolerating Soma 350mg 36mPRN well, continued this. Advised pt not to mix rx with ETOH or other medications that promote drowsiness. UDS from 10/17/2017 was +opiates as expected. Contract updated on 03/31/2017. PDMP showed he last filled Soma 350mg o79m/27/2019. He continues to [place] 5mg [place]  Last Assessment & Plan:  Pt tolerating Soma 350mg BID57m well (uses bid essentially every day), continued  this.  Advised pt not to mix rx with ETOH or other medications that promote drowsiness. UDS from 10/17/2017 was +opiates as expected. Contract updated on 03/31/2017. PDMP showed he last filled Soma 351m on 02/20/2018. He continues to get oxycodone 544[place]from [place] clinic [place]. Plan to update contract at next OV in 78m32mo  Plan of Care  Pharmacotherapy (Medications Ordered): Meds ordered this encounter  Medications  . oxyCODONE (OXY IR/ROXICODONE) 5 MG immediate release tablet    Sig: Take 1 tablet (5 mg total) by mouth every 6 (six) hours as needed for severe pain.    Dispense:  120 tablet    Refill:  0    Do not place this medication, or any other prescription from our practice, on "Automatic Refill". Patient may have prescription filled one day early if pharmacy is closed on scheduled refill date.    Order Specific Question:   Supervising Provider    Answer:   NAVMilinda [phone] oxyCODONE (OXY IR/ROXICODONE) 5 MG immediate release tablet    Sig: Take 1 tablet (5 mg total) by mouth every 6 (six) hours as needed for severe pain.    Dispense:  120 tablet    Refill:  0    Do not place this medication on "Automatic Refill". Patient may have prescription filled one day early if pharmacy is closed on scheduled refill date.    Order Specific Question:   Supervising Provider    Answer:   NAVMilinda Pointer8[[id number]]New Prescriptions   No medications on file   Medications administered today: Aaron Short no medications administered during this visit. Lab-work, procedure(s), and/or referral(s): No orders of the defined types were placed in this encounter.  Imaging and/or referral(s): None  Interventional therapies: Planned, scheduled, and/or pending:  None at this time.  Patient is encouraged follow-up with cardiology on tomorrow and make him aware of his heart rate.   Considering:  (Stop Coumadin for 5 days prior to procedure) Left  sided suprascapular nerve radiofrequency ablation    Palliative PRN treatment(s):  (Stop Coumadin for 5 days prior to procedure) Suprascapular nerve block  Possible suprascapular nerve radiofrequency ablation    Provider-requested follow-up: Return in about 2 months (around 05/27/2018) for MedMgmt.  Future Appointments  Date Time Provider DepRiver Edge2/18/2019  8:45 AM KinVevelyn FrancoisP [facility]   Primary Care Physician: RobNeomia DearD [facility] Note by: CryVevelyn Francois Date: 03/27/2018; Time: 9:36 AM  Pain Score Disclaimer: We use the NRS-11 scale. This is a self-reported, subjective measurement of pain severity with only modest accuracy. It is used primarily to identify changes within a particular patient. It must be understood that outpatient pain scales are significantly less accurate that those used for research, where they can be applied under ideal controlled circumstances with minimal exposure to variables. In reality, the score is likely to be a combination of pain intensity and pain affect, where pain affect describes the degree of emotional arousal or changes in action readiness caused by the sensory experience of pain. Factors such as social and work situation, setting, emotional state, anxiety levels, expectation, and prior pain experience may influence pain perception and show large inter-individual differences that may also be affected by time variables.  Patient instructions provided during this appointment: Patient Instructions  ____________________________________________________________________________________________  Medication Rules  Applies to: All patients receiving prescriptions (written or electronic).  Pharmacy of record:  Pharmacy where electronic prescriptions will be sent. If written prescriptions are taken to a different pharmacy, please inform the nursing staff. The pharmacy listed in  the electronic medical record should be the one where you would like electronic prescriptions to be sent.  Prescription refills: Only during scheduled appointments. Applies to both, written and electronic prescriptions.  NOTE: The following applies primarily to controlled substances (Opioid* Pain Medications).   Patient's responsibilities: 1. Pain Pills: Bring all pain pills to every appointment (except for procedure appointments). 2. Pill Bottles: Bring pills in original pharmacy bottle. Always bring newest bottle. Bring bottle, even if empty. 3. Medication refills: You are responsible for knowing and keeping track of what medications you need refilled. The day before your appointment, write a list of all prescriptions that need to be refilled. Bring that list to your appointment and give it to the admitting nurse. Prescriptions will be written only during appointments. If you forget a medication, it will not be "Called in", "Faxed", or "electronically sent". You will need to get another appointment to get these prescribed. 4. Prescription Accuracy: You are responsible for carefully inspecting your prescriptions before leaving our office. Have the discharge nurse carefully go over each prescription with you, before taking them home. Make sure that your name is accurately spelled, that your address is correct. Check the name and dose of your medication to make sure it is accurate. Check the number of pills, and the written instructions to make sure they are clear and accurate. Make sure that you are given enough medication to last until your next medication refill appointment. 5. Taking Medication: Take medication as prescribed. Never take more pills than instructed. Never take medication more frequently than prescribed. Taking less pills or less frequently is permitted and encouraged, when it comes to controlled substances (written prescriptions).  6. Inform other Doctors: Always inform, all of your  healthcare providers, of all the medications you take. 7. Pain Medication from other Providers: You are not allowed to accept any additional pain medication from any other Doctor or Healthcare provider. There are two exceptions to this rule. (see below) In the event that you require additional pain medication, you are responsible for notifying us, as stated below. 8. Medication Agreement: You are responsible for carefully reading and following our Medication Agreement. This must be signed before receiving any prescriptions from our practice. Safely store a copy of your signed Agreement. Violations to the Agreement will result in no further prescriptions. (Additional copies of our Medication Agreement are available upon request.) 9. Laws, Rules, & Regulations: All patients are expected to follow all Federal and [employer], Rules, [employer]. Ignorance of the Laws does not constitute a valid excuse. The use of any illegal substances is prohibited. 10. Adopted CDC guidelines & recommendations: Target dosing levels will be at or below 60 MME/day. Use of benzodiazepines** is not recommended.  Exceptions: There are only two exceptions to the rule of not receiving pain medications from other Healthcare Providers. 1. Exception #1 (Emergencies): In the event of an emergency (i.e.: accident requiring emergency care), you are allowed to receive additional pain medication. However, you are responsible for: As soon as you are able, call our office [phone], at any time of the day or night, and leave a message stating your name, the date and nature of the emergency, and the name and dose of the medication prescribed. In the event that your call is answered by a member of our staff, make sure to  document and save the date, time, and the name of the person that took your information.  2. Exception #2 (Planned Surgery): In the event that you are scheduled by another doctor or dentist to have any type of  surgery or procedure, you are allowed (for a period no longer than 30 days), to receive additional pain medication, for the acute post-op pain. However, in this case, you are responsible for picking up a copy of our "Post-op Pain Management for Surgeons" handout, and giving it to your surgeon or dentist. This document is available at our office, and does not require an appointment to obtain it. Simply go to our office during business hours (Monday-Thursday from 8:00 AM to 4:00 PM) (Friday 8:00 AM to 12:00 Noon) or if you have a scheduled appointment with [place], prior to your surgery, and ask for it by name. In addition, you will need to provide [place] with your name, name of your surgeon, type of surgery, and date of procedure or surgery.  *Opioid medications include: morphine, codeine, oxycodone, oxymorphone, hydrocodone, hydromorphone, meperidine, tramadol, tapentadol, buprenorphine, fentanyl, methadone. **Benzodiazepine medications include: diazepam (Valium), alprazolam (Xanax), clonazepam (Klonopine), lorazepam (Ativan), clorazepate (Tranxene), chlordiazepoxide (Librium), estazolam (Prosom), oxazepam (Serax), temazepam (Restoril), triazolam (Halcion) (Last updated: 07/26/2017) ____________________________________________________________________________________________

## 2018-03-28 DIAGNOSIS — E7800 Pure hypercholesterolemia, unspecified: Secondary | ICD-10-CM | POA: Diagnosis not present

## 2018-03-28 DIAGNOSIS — I4892 Unspecified atrial flutter: Secondary | ICD-10-CM | POA: Diagnosis not present

## 2018-03-28 DIAGNOSIS — I4891 Unspecified atrial fibrillation: Secondary | ICD-10-CM | POA: Diagnosis not present

## 2018-03-28 DIAGNOSIS — I10 Essential (primary) hypertension: Secondary | ICD-10-CM | POA: Diagnosis not present

## 2018-04-22 ENCOUNTER — Telehealth: Payer: Self-pay

## 2018-04-22 NOTE — Telephone Encounter (Signed)
Pt stated that his medication is due to be refilled on the 28th but the pharmacy is going to be closed and the pharmacy said they need authorization to fill his med one day early. [facility] [phone].

## 2018-04-22 NOTE — Telephone Encounter (Signed)
Called left message ok to fill early.

## 2018-04-30 DIAGNOSIS — L570 Actinic keratosis: Secondary | ICD-10-CM | POA: Diagnosis not present

## 2018-04-30 DIAGNOSIS — Z85828 Personal history of other malignant neoplasm of skin: Secondary | ICD-10-CM | POA: Diagnosis not present

## 2018-04-30 DIAGNOSIS — L821 Other seborrheic keratosis: Secondary | ICD-10-CM | POA: Diagnosis not present

## 2018-04-30 DIAGNOSIS — L814 Other melanin hyperpigmentation: Secondary | ICD-10-CM | POA: Diagnosis not present

## 2018-05-15 ENCOUNTER — Encounter: Payer: Self-pay | Admitting: Nurse Practitioner

## 2018-05-15 ENCOUNTER — Ambulatory Visit: Payer: Medicare Other | Attending: Nurse Practitioner | Admitting: Nurse Practitioner

## 2018-05-15 VITALS — BP 113/70 | HR 73 | Temp 97.7°F | Resp 16 | Ht 68.0 in | Wt 235.0 lb

## 2018-05-15 DIAGNOSIS — M25561 Pain in right knee: Secondary | ICD-10-CM | POA: Diagnosis not present

## 2018-05-15 DIAGNOSIS — M5412 Radiculopathy, cervical region: Secondary | ICD-10-CM

## 2018-05-15 DIAGNOSIS — M25551 Pain in right hip: Secondary | ICD-10-CM | POA: Diagnosis not present

## 2018-05-15 DIAGNOSIS — Z7901 Long term (current) use of anticoagulants: Secondary | ICD-10-CM | POA: Insufficient documentation

## 2018-05-15 DIAGNOSIS — M19012 Primary osteoarthritis, left shoulder: Secondary | ICD-10-CM

## 2018-05-15 DIAGNOSIS — Z6834 Body mass index (BMI) 34.0-34.9, adult: Secondary | ICD-10-CM | POA: Insufficient documentation

## 2018-05-15 DIAGNOSIS — I10 Essential (primary) hypertension: Secondary | ICD-10-CM | POA: Diagnosis not present

## 2018-05-15 DIAGNOSIS — Z79891 Long term (current) use of opiate analgesic: Secondary | ICD-10-CM | POA: Insufficient documentation

## 2018-05-15 DIAGNOSIS — M545 Low back pain: Secondary | ICD-10-CM | POA: Diagnosis present

## 2018-05-15 DIAGNOSIS — Z8249 Family history of ischemic heart disease and other diseases of the circulatory system: Secondary | ICD-10-CM | POA: Diagnosis not present

## 2018-05-15 DIAGNOSIS — Z96643 Presence of artificial hip joint, bilateral: Secondary | ICD-10-CM | POA: Insufficient documentation

## 2018-05-15 DIAGNOSIS — Z96653 Presence of artificial knee joint, bilateral: Secondary | ICD-10-CM | POA: Insufficient documentation

## 2018-05-15 DIAGNOSIS — I4891 Unspecified atrial fibrillation: Secondary | ICD-10-CM | POA: Diagnosis not present

## 2018-05-15 DIAGNOSIS — M19011 Primary osteoarthritis, right shoulder: Secondary | ICD-10-CM

## 2018-05-15 DIAGNOSIS — M47816 Spondylosis without myelopathy or radiculopathy, lumbar region: Secondary | ICD-10-CM | POA: Insufficient documentation

## 2018-05-15 DIAGNOSIS — Z79899 Other long term (current) drug therapy: Secondary | ICD-10-CM | POA: Insufficient documentation

## 2018-05-15 DIAGNOSIS — E782 Mixed hyperlipidemia: Secondary | ICD-10-CM | POA: Diagnosis not present

## 2018-05-15 DIAGNOSIS — M25552 Pain in left hip: Secondary | ICD-10-CM | POA: Diagnosis not present

## 2018-05-15 DIAGNOSIS — Z8551 Personal history of malignant neoplasm of bladder: Secondary | ICD-10-CM | POA: Diagnosis not present

## 2018-05-15 DIAGNOSIS — M25562 Pain in left knee: Secondary | ICD-10-CM | POA: Diagnosis not present

## 2018-05-15 DIAGNOSIS — X58XXXA Exposure to other specified factors, initial encounter: Secondary | ICD-10-CM | POA: Insufficient documentation

## 2018-05-15 DIAGNOSIS — F17210 Nicotine dependence, cigarettes, uncomplicated: Secondary | ICD-10-CM | POA: Insufficient documentation

## 2018-05-15 DIAGNOSIS — G894 Chronic pain syndrome: Secondary | ICD-10-CM | POA: Insufficient documentation

## 2018-05-15 DIAGNOSIS — E119 Type 2 diabetes mellitus without complications: Secondary | ICD-10-CM | POA: Diagnosis not present

## 2018-05-15 DIAGNOSIS — S8012XA Contusion of left lower leg, initial encounter: Secondary | ICD-10-CM | POA: Insufficient documentation

## 2018-05-15 DIAGNOSIS — S39012A Strain of muscle, fascia and tendon of lower back, initial encounter: Secondary | ICD-10-CM | POA: Insufficient documentation

## 2018-05-15 MED ORDER — OXYCODONE HCL 5 MG PO TABS: 5.0000 mg | ORAL_TABLET | Freq: Four times a day (QID) | ORAL | 0 refills | Status: DC | PRN

## 2018-05-15 NOTE — Patient Instructions (Addendum)
____________________________________________________________________________________________  Medication Rules  Purpose: To inform patients, and their family members, of our rules and regulations.  Applies to: All patients receiving prescriptions (written or electronic).  Pharmacy of record: Pharmacy where electronic prescriptions will be sent. If written prescriptions are taken to a different pharmacy, please inform the nursing staff. The pharmacy listed in the electronic medical record should be the one where you would like electronic prescriptions to be sent.  Electronic prescriptions: In compliance with the [place] Strengthen Opioid Misuse Prevention (STOP) Act of 2017 (Session Law 2017-74/H243), effective May 29, 2018, all controlled substances must be electronically prescribed. Calling prescriptions to the pharmacy will cease to exist.  Prescription refills: Only during scheduled appointments. Applies to all prescriptions.  NOTE: The following applies primarily to controlled substances (Opioid* Pain Medications).   Patient's responsibilities: 1. Pain Pills: Bring all pain pills to every appointment (except for procedure appointments). 2. Pill Bottles: Bring pills in original pharmacy bottle. Always bring the newest bottle. Bring bottle, even if empty. 3. Medication refills: You are responsible for knowing and keeping track of what medications you take and those you need refilled. The day before your appointment: write a list of all prescriptions that need to be refilled. The day of the appointment: give the list to the admitting nurse. Prescriptions will be written only during appointments. If you forget a medication: it will not be "Called in", "Faxed", or "electronically sent". You will need to get another appointment to get these prescribed. No early refills. Do not call asking to have your prescription filled early. 4. Prescription Accuracy: You are responsible for  carefully inspecting your prescriptions before leaving our office. Have the discharge nurse carefully go over each prescription with you, before taking them home. Make sure that your name is accurately spelled, that your address is correct. Check the name and dose of your medication to make sure it is accurate. Check the number of pills, and the written instructions to make sure they are clear and accurate. Make sure that you are given enough medication to last until your next medication refill appointment. 5. Taking Medication: Take medication as prescribed. When it comes to controlled substances, taking less pills or less frequently than prescribed is permitted and encouraged. Never take more pills than instructed. Never take medication more frequently than prescribed.  6. Inform other Doctors: Always inform, all of your healthcare providers, of all the medications you take. 7. Pain Medication from other Providers: You are not allowed to accept any additional pain medication from any other Doctor or Healthcare provider. There are two exceptions to this rule. (see below) In the event that you require additional pain medication, you are responsible for notifying us, as stated below. 8. Medication Agreement: You are responsible for carefully reading and following our Medication Agreement. This must be signed before receiving any prescriptions from our practice. Safely store a copy of your signed Agreement. Violations to the Agreement will result in no further prescriptions. (Additional copies of our Medication Agreement are available upon request.) 9. Laws, Rules, & Regulations: All patients are expected to follow all Federal and State Laws, Statutes, Rules, & Regulations. Ignorance of the Laws does not constitute a valid excuse. The use of any illegal substances is prohibited. 10. Adopted CDC guidelines & recommendations: Target dosing levels will be at or below 60 MME/day. Use of benzodiazepines** is not  recommended.  Exceptions: There are only two exceptions to the rule of not receiving pain medications from other Healthcare Providers. 1.   Exception #1 (Emergencies): In the event of an emergency (i.e.: accident requiring emergency care), you are allowed to receive additional pain medication. However, you are responsible for: As soon as you are able, call our office [phone], at any time of the day or night, and leave a message stating your name, the date and nature of the emergency, and the name and dose of the medication prescribed. In the event that your call is answered by a member of our staff, make sure to document and save the date, time, and the name of the person that took your information.  2. Exception #2 (Planned Surgery): In the event that you are scheduled by another doctor or dentist to have any type of surgery or procedure, you are allowed (for a period no longer than 30 days), to receive additional pain medication, for the acute post-op pain. However, in this case, you are responsible for picking up a copy of our "Post-op Pain Management for Surgeons" handout, and giving it to your surgeon or dentist. This document is available at our office, and does not require an appointment to obtain it. Simply go to our office during business hours (Monday-Thursday from 8:00 AM to 4:00 PM) (Friday 8:00 AM to 12:00 Noon) or if you have a scheduled appointment with [place], prior to your surgery, and ask for it by name. In addition, you will need to provide [place] with your name, name of your surgeon, type of surgery, and date of procedure or surgery.  *Opioid medications include: morphine, codeine, oxycodone, oxymorphone, hydrocodone, hydromorphone, meperidine, tramadol, tapentadol, buprenorphine, fentanyl, methadone. **Benzodiazepine medications include: diazepam (Valium), alprazolam (Xanax), clonazepam (Klonopine), lorazepam (Ativan), clorazepate (Tranxene), chlordiazepoxide (Librium), estazolam (Prosom),  oxazepam (Serax), temazepam (Restoril), triazolam (Halcion) (Last updated: 07/26/2017) ____________________________________________________________________________________________    BMI Assessment: Estimated body mass index is 35.73 kg/m as calculated from the following:   Height as of this encounter: 5\' 8"  (1.727 m).   Weight as of this encounter: 235 lb (106.6 kg).  BMI interpretation table: BMI level Category Range association with higher incidence of chronic pain  <18 kg/m2 Underweight   18.5-24.9 kg/m2 Ideal body weight   25-29.9 kg/m2 Overweight Increased incidence by 20%  30-34.9 kg/m2 Obese (Class I) Increased incidence by 68%  35-39.9 kg/m2 Severe obesity (Class II) Increased incidence by 136%  >40 kg/m2 Extreme obesity (Class III) Increased incidence by 254%   Patient's current BMI Ideal Body weight  Body mass index is 35.73 kg/m. Ideal body weight: 68.4 kg (150 lb 12.7 oz) Adjusted ideal body weight: 83.7 kg (184 lb 7.6 oz)   BMI Readings from Last 4 Encounters:  05/15/18 35.73 kg/m  03/27/18 34.97 kg/m  02/25/18 34.97 kg/m  01/24/18 34.67 kg/m   Wt Readings from Last 4 Encounters:  05/15/18 235 lb (106.6 kg)  03/27/18 230 lb (104.3 kg)  02/25/18 230 lb (104.3 kg)  01/24/18 228 lb (103.4 kg)   Oxycodone 5 mg x 2 months escribed to your pharmacy.  Fill dates are 05/21/18 and 06/20/18.

## 2018-05-15 NOTE — Progress Notes (Signed)
Nursing Pain Medication Assessment:  Safety precautions to be maintained throughout the outpatient stay will include: orient to surroundings, keep bed in low position, maintain call bell within reach at all times, provide assistance with transfer out of bed and ambulation.  Medication Inspection Compliance: Pill count conducted under aseptic conditions, in front of the patient. Neither the pills nor the bottle was removed from the patient's sight at any time. Once count was completed pills were immediately returned to the patient in their original bottle.  Medication: Oxycodone IR Pill/Patch Count: 29 of 120 pills remain Pill/Patch Appearance: Markings consistent with prescribed medication Bottle Appearance: Standard pharmacy container. Clearly labeled. Filled Date: 8 / 25 / 2019 Last Medication intake:  Today

## 2018-05-15 NOTE — Progress Notes (Signed)
Patient's Name: Aaron Short  MRN: [id number]  Referring Provider: Neomia Dear, MD  DOB: 06-19-1945  PCP: Neomia Dear, MD  DOS: 05/15/2018  Note by: Vevelyn Francois NP  Service setting: Ambulatory outpatient  Specialty: Interventional Pain Management  Location: [facility] (AMB) Pain Management Facility    Patient type: Established    Primary Reason(s) for Visit: Encounter for prescription drug management. (Level of risk: moderate)  CC: Shoulder Pain (left) and Back Pain (lower bilateral )  HPI  Aaron Short is a 72 y.o. year old, male patient, who comes today for a medication management evaluation. He has Long term current use of opiate analgesic; Long term prescription opiate use; Opiate use (97.5 MME/Day); Encounter for therapeutic drug level monitoring; Encounter for chronic pain management; Opioid dependence, daily use ([place]); Chronic hip pain (Location of Secondary source of pain) (Bilateral) (R>L); Chronic shoulder pain (Location of Primary Source of Pain) (Bilateral) (L>R); Chronic low back pain (Location of Tertiary source of pain) (Bilateral) (R>L); Chronic knee pain (Bilateral) (R>L); S/P THR: total hip replacement (Bilateral); S/P TKR: total knee replacement (Bilateral); Atrial fibrillation ([place]); Malignant neoplasm of urinary bladder ([place]); Type 2 diabetes mellitus ([place]); Muscle spasticity; Coumadin anticoagulation; Mixed hyperlipidemia; Pure hypercholesterolemia; History of bladder cancer; History of hip fracture; History of pelvic fracture; Long-term use of high-risk medication; Chronic neck pain; Chondrocalcinosis; Chronic shoulder pain (Radicular); History of shoulder surgery; Myofascial pain; Musculoskeletal pain; Muscle spasm; Lumbar facet syndrome (Bilateral) (R>L); Lumbar spondylosis; Morbid obesity ([place]); Continuous opioid dependence ([place]); Disturbance of skin sensation; Malignant neoplasm of bladder ([place]); Smoker; FH: atrial fibrillation; Malignant neoplasm of  overlapping sites of bladder ([place]); Obesity, Class I, BMI 30-34.9; Chronic pain syndrome; Hypertension, benign; Impaired fasting glucose; Chronic acromioclavicular joint pain (Left); Osteoarthritis of shoulder (Bilateral) (L>R); 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 His primarily concern today is the Shoulder Pain (left) and Back Pain (lower bilateral )  Pain Assessment: Location: Left(lower back bilateral ) Shoulder(back) Radiating: pain goes down the arm and towards collar bone.   Onset: More than a month ago Duration: Chronic pain Quality: Dull, Discomfort, Shooting, Constant Severity: 1 /10 (subjective, self-reported pain score)  Note: Reported level is compatible with observation.                          Effect on ADL: limited ROM in left shoulder  Timing: Constant Modifying factors: medications. BP: 113/70  HR: 73  Aaron Short was last scheduled for an appointment on 03/27/2018 for medication management. During today's appointment we reviewed Aaron Short chronic pain status, as well as his outpatient medication regimen. He suffered a fall while walking and performing his hobby.  He admits that he reinjured his left shoulder.  He did have this evaluated.  He admits that he will continue to monitor.  The patient  reports no history of drug use. His body mass index is 35.73 kg/m.  Further details on both, my assessment(s), as well as the proposed treatment plan, please see below.  Controlled Substance Pharmacotherapy Assessment REMS (Risk Evaluation and Mitigation Strategy)  Analgesic:Oxycodone IR 5 mg every 6hours (5 mg/dayof oxycodone)  MME:22.13mq per day PJanett Billow RN  05/15/2018  9:04  AM  Sign when Signing Visit Nursing Pain Medication Assessment:  Safety precautions to be maintained throughout  the outpatient stay will include: orient to surroundings, keep bed in low position, maintain call bell within reach at all times, provide assistance with transfer out of bed and ambulation.  Medication Inspection Compliance: Pill count conducted under aseptic conditions, in front of the patient. Neither the pills nor the bottle was removed from the patient's sight at any time. Once count was completed pills were immediately returned to the patient in their original bottle.  Medication: Oxycodone IR Pill/Patch Count: 29 of 120 pills remain Pill/Patch Appearance: Markings consistent with prescribed medication Bottle Appearance: Standard pharmacy container. Clearly labeled. Filled Date: 75 / 25 / 2019 Last Medication intake:  Today   Pharmacokinetics: Liberation and absorption (onset of action): WNL Distribution (time to peak effect): WNL Metabolism and excretion (duration of action): WNL         Pharmacodynamics: Desired effects: Analgesia: Mr. Corvin reports >50% benefit. Functional ability: Patient reports that medication allows him to accomplish basic ADLs Clinically meaningful improvement in function (CMIF): Sustained CMIF goals met Perceived effectiveness: Described as relatively effective, allowing for increase in activities of daily living (ADL) Undesirable effects: Side-effects or Adverse reactions: None reported Monitoring: [place] PMP: Online review of the past 25-monthperiod conducted. Compliant with practice rules and regulations Last UDS on record: Summary  Date Value Ref Range Status  02/25/2018 FINAL  Final    Comment:    ==================================================================== TOXASSURE SELECT 13 (MW) ==================================================================== Test                             Result       Flag       Units Drug Present  and Declared for Prescription Verification   Oxycodone                      1586         EXPECTED   ng/mg creat   Oxymorphone                    2057         EXPECTED   ng/mg creat   Noroxycodone                   1443         EXPECTED   ng/mg creat   Noroxymorphone                 638          EXPECTED   ng/mg creat    Sources of oxycodone are scheduled prescription medications.    Oxymorphone, noroxycodone, and noroxymorphone are expected    metabolites of oxycodone. Oxymorphone is also available as a    scheduled prescription medication. ==================================================================== Test                      Result    Flag   Units      Ref Range   Creatinine              167              mg/dL      >=20 ==================================================================== Declared Medications:  The flagging and interpretation on this report are based on the  following declared medications.  Unexpected results may arise from  inaccuracies in the declared medications.  **Note: The testing scope of this panel includes these medications:  Oxycodone  **Note: The testing scope of  this panel does not include following  reported medications:  Acetaminophen  Carisoprodol  Cetirizine  Digoxin  Furosemide  Melatonin  Metoprolol  Naloxone  Rivaroxaban  Rosuvastatin  Simvastatin  Triamcinolone acetonide ==================================================================== For clinical consultation, please call [phone]. ====================================================================    UDS interpretation: Compliant          Medication Assessment Form: Reviewed. Patient indicates being compliant with therapy Treatment compliance: Compliant Risk Assessment Profile: Aberrant behavior: See prior evaluations. None observed or detected today Comorbid factors increasing risk of overdose: See prior notes. No additional risks detected today Opioid risk tool  (ORT) (Total Score): 0 Personal History of Substance Abuse (SUD-Substance use disorder):  Alcohol: Negative  Illegal Drugs: Negative  Rx Drugs: Negative  ORT Risk Level calculation: Low Risk Risk of substance use disorder (SUD): Moderate-to-High Opioid Risk Tool - 05/15/18 0901      Family History of Substance Abuse   Alcohol  Negative    Illegal Drugs  Negative    Rx Drugs  Negative      Personal History of Substance Abuse   Alcohol  Negative    Illegal Drugs  Negative    Rx Drugs  Negative      Age   Age between 61-45 years   No      History of Preadolescent Sexual Abuse   History of Preadolescent Sexual Abuse  Negative or Male      Psychological Disease   Psychological Disease  Negative    Depression  Negative      Total Score   Opioid Risk Tool Scoring  0    Opioid Risk Interpretation  Low Risk      ORT Scoring interpretation table:  Score <3 = Low Risk for SUD  Score between 4-7 = Moderate Risk for SUD  Score >8 = High Risk for Opioid Abuse   Risk Mitigation Strategies:  Patient Counseling: Covered Patient-Prescriber Agreement (PPA): Present and active  Notification to other healthcare providers: Done  Pharmacologic Plan: No change in therapy, at this time.             Laboratory Chemistry  Inflammation Markers (CRP: Acute Phase) (ESR: Chronic Phase) Lab Results  Component Value Date   CRP <0.5 11/18/2015   ESRSEDRATE 6 11/18/2015                         Rheumatology Markers No results found for: RF, ANA, LABURIC, URICUR, LYMEIGGIGMAB, LYMEABIGMQN, HLAB27                      Renal Function Markers Lab Results  Component Value Date   BUN 15 11/18/2015   CREATININE 0.80 11/18/2015   GFRAA >60 11/18/2015   GFRNONAA >60 11/18/2015                             Hepatic Function Markers Lab Results  Component Value Date   AST 19 11/18/2015   ALT 17 11/18/2015   ALBUMIN 4.1 11/18/2015   ALKPHOS 68 11/18/2015                         Electrolytes Lab Results  Component Value Date   NA 139 11/18/2015   K 4.3 11/18/2015   CL 103 11/18/2015   CALCIUM 9.3 11/18/2015   MG 1.8 11/18/2015  Neuropathy Markers Lab Results  Component Value Date   FMBWGYKZ99 357 11/18/2015                        CNS Tests No results found for: COLORCSF, APPEARCSF, RBCCOUNTCSF, WBCCSF, POLYSCSF, LYMPHSCSF, EOSCSF, PROTEINCSF, GLUCCSF, JCVIRUS, CSFOLI, IGGCSF                      Bone Pathology Markers Lab Results  Component Value Date   25OHVITD1 31 11/18/2015   25OHVITD2 <1.0 11/18/2015   25OHVITD3 30 11/18/2015                         Coagulation Parameters No results found for: INR, LABPROT, APTT, PLT, DDIMER, LABHEMA, VITAMINK1                      Cardiovascular Markers No results found for: BNP, CKTOTAL, CKMB, TROPONINI, HGB, HCT                       CA Markers No results found for: CEA, CA125, LABCA2                      Note: Lab results reviewed.  Recent Diagnostic Imaging Results  DG Lumbar Spine Complete W/Bend CLINICAL DATA:  Low back pain for 1 month radiating down LEFT leg, injured back lifting something heavy, lumbar spondylosis, lumbar facet syndrome  EXAM: LUMBAR SPINE - COMPLETE WITH BENDING VIEWS  COMPARISON:  11/18/2015  FINDINGS: Osseous demineralization.  Five non-rib-bearing lumbar vertebra.  Multilevel endplate spur formation throughout lumbar spine.  Minimal disc space narrowing L4-L5.  Vertebral body heights maintained without fracture or bone destruction.  Minimal retrolisthesis at L5-S1 approximately 3 mm which increases to 6 mm with extension and appears unchanged with flexion.  Remaining alignments normal it out abnormal motion with flexion or extension.  SI joints preserved.  BILATERAL hip prostheses.  IMPRESSION: Scattered degenerative disc disease changes of the lumbar spine.  Minimal retrolisthesis at L5-S1 which mildly increases  with extension and is unchanged with flexion.  Electronically Signed   By: Lavonia Dana M.D.   On: 11/08/2017 11:02  Complexity Note: Imaging results reviewed. Results shared with Aaron Short, using Layman's terms.                         Meds   Current Outpatient Medications:  .  acetaminophen (TYLENOL) 500 MG tablet, Take 500 mg by mouth every 8 (eight) hours as needed., Disp: , Rfl:  .  carisoprodol (SOMA) 350 MG tablet, Take 350 mg by mouth 2 (two) times daily. , Disp: , Rfl:  .  Cetirizine HCl (ZYRTEC ALLERGY) 10 MG CAPS, Take 1 daily for itching, Disp: 30 capsule, Rfl: 0 .  digoxin (DIGOX) 0.125 MG tablet, TAKE 1 TABLET BY MOUTH EVERY DAY, Disp: , Rfl:  .  furosemide (LASIX) 20 MG tablet, Take 20 mg by mouth as needed., Disp: , Rfl:  .  metoprolol succinate (TOPROL-XL) 25 MG 24 hr tablet, Take 25 mg by mouth., Disp: , Rfl:  .  Naloxone HCl (NARCAN) 4 MG/0.1ML LIQD, Place 1 spray into the nose once. Spray half of bottle content into each nostril, then call 911, Disp: 2 each, Rfl: 0 .  [START ON 06/20/2018] oxyCODONE (OXY IR/ROXICODONE) 5 MG immediate release tablet, Take 1 tablet (5 mg total) by  mouth every 6 (six) hours as needed for severe pain., Disp: 120 tablet, Rfl: 0 .  rivaroxaban (XARELTO) 20 MG TABS tablet, Take 20 mg by mouth., Disp: , Rfl:  .  simvastatin (ZOCOR) 40 MG tablet, Take 40 mg by mouth at bedtime. , Disp: , Rfl:  .  triamcinolone cream (KENALOG) 0.1 %, Apply 1 application topically 2 (two) times daily., Disp: 30 g, Rfl: 0 .  furosemide (LASIX) 20 MG tablet, TAKE 1 TABLET(20 MG) BY MOUTH DAILY AS NEEDED FOR SWELLING, Disp: , Rfl:  .  Magnesium 500 MG TABS, Take 500 mg by mouth as needed., Disp: , Rfl:  .  metoprolol succinate (TOPROL-XL) 25 MG 24 hr tablet, Take 25 mg by mouth., Disp: , Rfl:  .  [START ON 05/21/2018] oxyCODONE (OXY IR/ROXICODONE) 5 MG immediate release tablet, Take 1 tablet (5 mg total) by mouth every 6 (six) hours as needed for severe pain.,  Disp: 120 tablet, Rfl: 0 .  rosuvastatin (CRESTOR) 20 MG tablet, Take 20 mg by mouth daily. , Disp: , Rfl:   ROS  Constitutional: Denies any fever or chills Gastrointestinal: No reported hemesis, hematochezia, vomiting, or acute GI distress Musculoskeletal: Denies any acute onset joint swelling, redness, loss of ROM, or weakness Neurological: No reported episodes of acute onset apraxia, aphasia, dysarthria, agnosia, amnesia, paralysis, loss of coordination, or loss of consciousness  Allergies  Aaron Short is allergic to diltiazem; penicillins; and tizanidine.  PFSH  Drug: Aaron Short  reports no history of drug use. Alcohol:  reports no history of alcohol use. Tobacco:  reports that he has been smoking cigarettes. He has been smoking about 0.50 packs per day. He has never used smokeless tobacco. Medical:  has a past medical history of Arthralgia of lower leg (04/02/2012), Arthralgia of upper arm (06/18/2009), Cancer ([place]), Diabetes mellitus without complication ([place]), FH: atrial fibrillation, and Hypertension. Surgical: Aaron Short  has a past surgical history that includes bladder cancer surgery (2015); Joint replacement; Replacement total hip w/  resurfacing implants (Bilateral); Total knee arthroplasty (Bilateral); and Shoulder surgery (Bilateral). Family: family history includes Dementia in his mother; Heart disease in his father.  Constitutional Exam  General appearance: Well nourished, well developed, and well hydrated. In no apparent acute distress Vitals:   05/15/18 0854  BP: 113/70  Pulse: 73  Resp: 16  Temp: 97.7 F (36.5 C)  TempSrc: Oral  SpO2: 95%  Weight: 235 lb (106.6 kg)  Height: _0  (1.727 m)  Psych/Mental status: Alert, oriented x 3 (person, place, & time)       Eyes: PERLA Respiratory: No evidence of acute respiratory distress  Cervical Spine Area Exam  Skin & Axial Inspection: No masses, redness, edema, swelling, or associated skin  lesions Alignment: Symmetrical Functional ROM: Unrestricted ROM      Stability: No instability detected Muscle Tone/Strength: Functionally intact. No obvious neuro-muscular anomalies detected. Sensory (Neurological): Unimpaired Palpation: No palpable anomalies              Upper Extremity (UE) Exam    Side: Right upper extremity  Side: Left upper extremity  Skin & Extremity Inspection: Evidence of prior arthroplastic surgery  Skin & Extremity Inspection: Evidence of prior arthroplastic surgery  Functional ROM: Decreased ROM          Functional ROM: Restricted ROM          Muscle Tone/Strength: Guarding  Muscle Tone/Strength: TEFL teacher (Neurological): Unimpaired          Sensory (Neurological): Unimpaired  Palpation: No palpable anomalies              Palpation: Tender              Provocative Test(s):  Phalen's test: deferred Tinel's test: deferred Apley's scratch test (touch opposite shoulder):  Action 1 (Across chest): deferred Action 2 (Overhead): deferred Action 3 (LB reach): deferred   Provocative Test(s):  Phalen's test: deferred Tinel's test: deferred Apley's scratch test (touch opposite shoulder):  Action 1 (Across chest): Decreased ROM Action 2 (Overhead): Decreased ROM Action 3 (LB reach): Decreased ROM    Gait & Posture Assessment  Ambulation: Unassisted Gait: Relatively normal for age and body habitus Posture: WNL    Assessment  Primary Diagnosis & Pertinent Problem List: The primary encounter diagnosis was Osteoarthritis of shoulder (Bilateral) (L>R). Diagnoses of Chronic shoulder pain (Radicular), Lumbar spondylosis, Chronic pain syndrome, and Long term prescription opiate use were also pertinent to this visit.  Status Diagnosis  Having a Flare-up Having a Flare-up Controlled 1. Osteoarthritis of shoulder (Bilateral) (L>R)   2. Chronic shoulder pain (Radicular)   3. Lumbar spondylosis   4. Chronic pain syndrome   5. Long term prescription  opiate use     Problems updated and reviewed during this visit: No problems updated. Plan of Care  Pharmacotherapy (Medications Ordered): Meds ordered this encounter  Medications  . oxyCODONE (OXY IR/ROXICODONE) 5 MG immediate release tablet    Sig: Take 1 tablet (5 mg total) by mouth every 6 (six) hours as needed for severe pain.    Dispense:  120 tablet    Refill:  0    Do not place this medication, or any other prescription from our practice, on "Automatic Refill". Patient may have prescription filled one day early if pharmacy is closed on scheduled refill date.    Order Specific Question:   Supervising Provider    Answer:   Milinda Pointer [phone]  . oxyCODONE (OXY IR/ROXICODONE) 5 MG immediate release tablet    Sig: Take 1 tablet (5 mg total) by mouth every 6 (six) hours as needed for severe pain.    Dispense:  120 tablet    Refill:  0    Do not place this medication, or any other prescription from our practice, on "Automatic Refill". Patient may have prescription filled one day early if pharmacy is closed on scheduled refill date.    Order Specific Question:   Supervising Provider    Answer:   Milinda Pointer [[id number]]   New Prescriptions   No medications on file   Medications administered today: Aaron Short had no medications administered during this visit. Lab-work, procedure(s), and/or referral(s): Orders Placed This Encounter  Procedures  . ToxASSURE Select 13 (MW), Urine   Imaging and/or referral(s): None  Interventional therapies: Planned, scheduled, and/or pending:  None at this time.   Considering:  (Stop Coumadin for 5 days prior to procedure) Left sided suprascapular nerve radiofrequency ablation    Palliative PRN treatment(s):  (Stop Coumadin for 5 days prior to procedure) Suprascapular nerve block  Possible suprascapular nerve radiofrequency ablation    Provider-requested follow-up: Return in about 2 months (around 07/16/2018)  for MedMgmt.  Future Appointments  Date Time Provider [employer]  07/15/2018  8:45 AM Vevelyn Francois, NP [facility] None   Primary Care Physician: Neomia Dear, MD Location[facility] Note by: Vevelyn Francois NP Date: 05/15/2018; Time: 11:15 AM  Pain Score Disclaimer: We use the NRS-11 scale. This is a self-reported,  subjective measurement of pain severity with only modest accuracy. It is used primarily to identify changes within a particular patient. It must be understood that outpatient pain scales are significantly less accurate that those used for research, where they can be applied under ideal controlled circumstances with minimal exposure to variables. In reality, the score is likely to be a combination of pain intensity and pain affect, where pain affect describes the degree of emotional arousal or changes in action readiness caused by the sensory experience of pain. Factors such as social and work situation, setting, emotional state, anxiety levels, expectation, and prior pain experience may influence pain perception and show large inter-individual differences that may also be affected by time variables.  Patient instructions provided during this appointment: Patient Instructions   ____________________________________________________________________________________________  Medication Rules  Purpose: To inform patients, and their family members, of our rules and regulations.  Applies to: All patients receiving prescriptions (written or electronic).  Pharmacy of record: Pharmacy where electronic prescriptions will be sent. If written prescriptions are taken to a different pharmacy, please inform the nursing staff. The pharmacy listed in the electronic medical record should be the one where you would like electronic prescriptions to be sent.  Electronic prescriptions: In compliance with the [place]  (STOP) Act of 2017 (Session Lanny Cramp [phone]), effective May 29, 2018, all controlled substances must be electronically prescribed. Calling prescriptions to the pharmacy will cease to exist.  Prescription refills: Only during scheduled appointments. Applies to all prescriptions.  NOTE: The following applies primarily to controlled substances (Opioid* Pain Medications).   Patient's responsibilities: 1. Pain Pills: Bring all pain pills to every appointment (except for procedure appointments). 2. Pill Bottles: Bring pills in original pharmacy bottle. Always bring the newest bottle. Bring bottle, even if empty. 3. Medication refills: You are responsible for knowing and keeping track of what medications you take and those you need refilled. The day before your appointment: write a list of all prescriptions that need to be refilled. The day of the appointment: give the list to the admitting nurse. Prescriptions will be written only during appointments. If you forget a medication: it will not be "Called in", "Faxed", or "electronically sent". You will need to get another appointment to get these prescribed. No early refills. Do not call asking to have your prescription filled early. 4. Prescription Accuracy: You are responsible for carefully inspecting your prescriptions before leaving our office. Have the discharge nurse carefully go over each prescription with you, before taking them home. Make sure that your name is accurately spelled, that your address is correct. Check the name and dose of your medication to make sure it is accurate. Check the number of pills, and the written instructions to make sure they are clear and accurate. Make sure that you are given enough medication to last until your next medication refill appointment. 5. Taking Medication: Take medication as prescribed. When it comes to controlled substances, taking less pills or less frequently than prescribed is permitted and  encouraged. Never take more pills than instructed. Never take medication more frequently than prescribed.  6. Inform other Doctors: Always inform, all of your healthcare providers, of all the medications you take. 7. Pain Medication from other Providers: You are not allowed to accept any additional pain medication from any other Doctor or Healthcare provider. There are two exceptions to this rule. (see below) In the event that you require additional pain medication, you are responsible for notifying us, as stated below.  8. Medication Agreement: You are responsible for carefully reading and following our Medication Agreement. This must be signed before receiving any prescriptions from our practice. Safely store a copy of your signed Agreement. Violations to the Agreement will result in no further prescriptions. (Additional copies of our Medication Agreement are available upon request.) 9. Laws, Rules, & Regulations: All patients are expected to follow all Federal and [employer], [employer], Rules, [employer]. Ignorance of the Laws does not constitute a valid excuse. The use of any illegal substances is prohibited. 10. Adopted CDC guidelines & recommendations: Target dosing levels will be at or below 60 MME/day. Use of benzodiazepines** is not recommended.  Exceptions: There are only two exceptions to the rule of not receiving pain medications from other Healthcare Providers. 1. Exception #1 (Emergencies): In the event of an emergency (i.e.: accident requiring emergency care), you are allowed to receive additional pain medication. However, you are responsible for: As soon as you are able, call our office [phone], at any time of the day or night, and leave a message stating your name, the date and nature of the emergency, and the name and dose of the medication prescribed. In the event that your call is answered by a member of our staff, make sure to document and save the date, time, and the name of  the person that took your information.  2. Exception #2 (Planned Surgery): In the event that you are scheduled by another doctor or dentist to have any type of surgery or procedure, you are allowed (for a period no longer than 30 days), to receive additional pain medication, for the acute post-op pain. However, in this case, you are responsible for picking up a copy of our "Post-op Pain Management for Surgeons" handout, and giving it to your surgeon or dentist. This document is available at our office, and does not require an appointment to obtain it. Simply go to our office during business hours (Monday-Thursday from 8:00 AM to 4:00 PM) (Friday 8:00 AM to 12:00 Noon) or if you have a scheduled appointment with [place], prior to your surgery, and ask for it by name. In addition, you will need to provide [place] with your name, name of your surgeon, type of surgery, and date of procedure or surgery.  *Opioid medications include: morphine, codeine, oxycodone, oxymorphone, hydrocodone, hydromorphone, meperidine, tramadol, tapentadol, buprenorphine, fentanyl, methadone. **Benzodiazepine medications include: diazepam (Valium), alprazolam (Xanax), clonazepam (Klonopine), lorazepam (Ativan), clorazepate (Tranxene), chlordiazepoxide (Librium), estazolam (Prosom), oxazepam (Serax), temazepam (Restoril), triazolam (Halcion) (Last updated: 07/26/2017) ____________________________________________________________________________________________    BMI Assessment: Estimated body mass index is 35.73 kg/m as calculated from the following:   Height as of this encounter: _0  (1.727 m).   Weight as of this encounter: 235 lb (106.6 kg).  BMI interpretation table: BMI level Category Range association with higher incidence of chronic pain  <18 kg/m2 Underweight   18.5-24.9 kg/m2 Ideal body weight   25-29.9 kg/m2 Overweight Increased incidence by 20%  30-34.9 kg/m2 Obese (Class I) Increased incidence by 68%  35-39.9 kg/m2  Severe obesity (Class II) Increased incidence by 136%  >40 kg/m2 Extreme obesity (Class III) Increased incidence by 254%   Patient's current BMI Ideal Body weight  Body mass index is 35.73 kg/m. Ideal body weight: 68.4 kg (150 lb 12.7 oz) Adjusted ideal body weight: 83.7 kg (184 lb 7.6 oz)   BMI Readings from Last 4 Encounters:  05/15/18 35.73 kg/m  03/27/18 34.97 kg/m  02/25/18 34.97 kg/m  01/24/18 34.67 kg/m  Wt Readings from Last 4 Encounters:  05/15/18 235 lb (106.6 kg)  03/27/18 230 lb (104.3 kg)  02/25/18 230 lb (104.3 kg)  01/24/18 228 lb (103.4 kg)   Oxycodone 5 mg x 2 months escribed to your pharmacy.  Fill dates are 05/21/18 and 06/20/18.

## 2018-05-20 LAB — TOXASSURE SELECT 13 (MW), URINE

## 2018-07-15 ENCOUNTER — Ambulatory Visit: Payer: Medicare Other | Attending: Nurse Practitioner | Admitting: Nurse Practitioner

## 2018-07-15 ENCOUNTER — Encounter: Payer: Self-pay | Admitting: Nurse Practitioner

## 2018-07-15 ENCOUNTER — Other Ambulatory Visit: Payer: Self-pay

## 2018-07-15 VITALS — BP 135/60 | HR 48 | Temp 97.7°F | Ht 68.0 in | Wt 235.0 lb

## 2018-07-15 DIAGNOSIS — G894 Chronic pain syndrome: Secondary | ICD-10-CM | POA: Diagnosis not present

## 2018-07-15 DIAGNOSIS — M19012 Primary osteoarthritis, left shoulder: Secondary | ICD-10-CM | POA: Diagnosis present

## 2018-07-15 DIAGNOSIS — M19011 Primary osteoarthritis, right shoulder: Secondary | ICD-10-CM | POA: Diagnosis not present

## 2018-07-15 DIAGNOSIS — M47816 Spondylosis without myelopathy or radiculopathy, lumbar region: Secondary | ICD-10-CM | POA: Insufficient documentation

## 2018-07-15 DIAGNOSIS — G8929 Other chronic pain: Secondary | ICD-10-CM | POA: Diagnosis present

## 2018-07-15 DIAGNOSIS — M25559 Pain in unspecified hip: Secondary | ICD-10-CM | POA: Diagnosis present

## 2018-07-15 MED ORDER — OXYCODONE HCL 5 MG PO TABS: 5.0000 mg | ORAL_TABLET | Freq: Four times a day (QID) | ORAL | 0 refills | Status: DC | PRN

## 2018-07-15 MED ORDER — OXYCODONE HCL 5 MG PO TABS
5.0000 mg | ORAL_TABLET | Freq: Four times a day (QID) | ORAL | 0 refills | Status: DC | PRN
Start: 2018-09-16 — End: 2018-10-10

## 2018-07-15 NOTE — Progress Notes (Signed)
Patient's Name: Aaron Short  MRN: [id number]  Referring Provider: Neomia Dear, MD  DOB: 04-18-1946  PCP: Neomia Dear, MD  DOS: 07/15/2018  Note by: Dionisio David, NP  Service setting: Ambulatory outpatient  Specialty: Interventional Pain Management  Location: [facility] (AMB) Pain Management Facility    Patient type: Established   HPI  Reason for Visit: Aaron Short is a 73 y.o. year old, male patient, who comes today with a chief complaint of Shoulder Pain Last Appointment: His last appointment at our practice was on 05/15/2018. I last saw him on 05/15/2018.  Pain Assessment: Today, Aaron Short describes the severity of the Chronic pain as a 1 /10. He indicates the location/referral of the pain to be Shoulder Left/denies. Onset was: More than a month ago. The quality of pain is described as Dull. Temporal description, or timing of pain is: Intermittent. Possible modifying factors: medications, heat or ice. Aaron Short  height is _0  (1.727 m) and weight is 235 lb (106.6 kg). His temperature is 97.7 F (36.5 C). His blood pressure is 135/60 and his pulse is 48 (abnormal). His oxygen saturation is 99%. He denies any current changes in his pain.  He denies any new problems today.  Controlled Substance Pharmacotherapy Assessment REMS (Risk Evaluation and Mitigation Strategy)  Analgesic:Oxycodone IR 5 mg every 6hours (5 mg/dayof oxycodone)  MME:22.52mq per day BChauncey Fischer RN  07/15/2018  8:28 AM  Sign when Signing Visit Nursing Pain Medication Assessment:  Safety precautions to be maintained throughout the outpatient stay will include: orient to surroundings, keep bed in low position, maintain call bell within reach at all times, provide assistance with transfer out of bed and ambulation.  Medication Inspection Compliance: Pill count conducted under aseptic conditions, in front of the patient. Neither the pills nor the bottle was removed from the  patient's sight at any time. Once count was completed pills were immediately returned to the patient in their original bottle.  Medication: Oxycodone IR Pill/Patch Count: 18 of 120 pills remain Pill/Patch Appearance: Markings consistent with prescribed medication Bottle Appearance: Standard pharmacy container. Clearly labeled. Filled Date: 1 / 233/ 2020 Last Medication intake:  Today   Pharmacokinetics: Liberation and absorption (onset of action): WNL Distribution (time to peak effect): WNL Metabolism and excretion (duration of action): WNL         Pharmacodynamics: Desired effects: Analgesia: Mr. GRickelreports >50% benefit. Functional ability: Patient reports that medication allows him to accomplish basic ADLs Clinically meaningful improvement in function (CMIF): Sustained CMIF goals met Perceived effectiveness: Described as relatively effective, allowing for increase in activities of daily living (ADL) Undesirable effects: Side-effects or Adverse reactions: None reported Monitoring: [place] PMP: Online review of the past 132-montheriod conducted. Compliant with practice rules and regulations Last UDS on record: Summary  Date Value Ref Range Status  05/15/2018 FINAL  Final    Comment:    ==================================================================== TOXASSURE SELECT 13 (MW) ==================================================================== Test                             Result       Flag       Units Drug Present and Declared for Prescription Verification   Oxycodone                      650          EXPECTED   ng/mg creat   Oxymorphone  1864         EXPECTED   ng/mg creat   Noroxycodone                   1931         EXPECTED   ng/mg creat   Noroxymorphone                 630          EXPECTED   ng/mg creat    Sources of oxycodone are scheduled prescription medications.    Oxymorphone, noroxycodone, and noroxymorphone are expected    metabolites  of oxycodone. Oxymorphone is also available as a    scheduled prescription medication. ==================================================================== Test                      Result    Flag   Units      Ref Range   Creatinine              135              mg/dL      >=20 ==================================================================== Declared Medications:  The flagging and interpretation on this report are based on the  following declared medications.  Unexpected results may arise from  inaccuracies in the declared medications.  **Note: The testing scope of this panel includes these medications:  Oxycodone  **Note: The testing scope of this panel does not include following  reported medications:  Acetaminophen (Tylenol)  Carisoprodol (Soma)  Cetirizine  Digoxin (Lanoxin)  Furosemide (Lasix)  Magnesium  Metoprolol  Naloxone  Rivaroxaban  Rosuvastatin (Crestor)  Simvastatin (Zocor)  Triamcinolone (Kenalog) ==================================================================== For clinical consultation, please call [phone]. ====================================================================    UDS interpretation: Compliant          Medication Assessment Form: Reviewed. Patient indicates being compliant with therapy Treatment compliance: Compliant Risk Assessment Profile: Aberrant behavior: See initial evaluations. None observed or detected today Comorbid factors increasing risk of overdose: See initial evaluation. No additional risks detected today Opioid risk tool (ORT):  Opioid Risk  07/15/2018  Alcohol 0  Illegal Drugs 0  Rx Drugs 0  Alcohol 0  Illegal Drugs 0  Rx Drugs 0  Age between 16-45 years  0  History of Preadolescent Sexual Abuse 0  Psychological Disease 0  Depression 0  Opioid Risk Tool Scoring 0  Opioid Risk Interpretation Low Risk    ORT Scoring interpretation table:  Score <3 = Low Risk for SUD  Score between 4-7 = Moderate Risk for  SUD  Score >8 = High Risk for Opioid Abuse   Risk of substance use disorder (SUD): Low  Risk Mitigation Strategies:  Patient Counseling: Covered Patient-Prescriber Agreement (PPA): Present and active  Notification to other healthcare providers: Done  Pharmacologic Plan: No change in therapy, at this time.             ROS  Constitutional: Denies any fever or chills Gastrointestinal: No reported hemesis, hematochezia, vomiting, or acute GI distress Musculoskeletal: Denies any acute onset joint swelling, redness, loss of ROM, or weakness Neurological: No reported episodes of acute onset apraxia, aphasia, dysarthria, agnosia, amnesia, paralysis, loss of coordination, or loss of consciousness  Medication Review  Cetirizine HCl, Magnesium, acetaminophen, carisoprodol, digoxin, furosemide, metoprolol succinate, naloxone, oxyCODONE, rivaroxaban, rosuvastatin, simvastatin, and triamcinolone cream  History Review  Allergy: Mr. Vandewater is allergic to diltiazem; penicillins; and tizanidine. Drug: Mr. Mera  reports no history of drug use. Alcohol:  reports no  history of alcohol use. Tobacco:  reports that he has been smoking cigarettes. He has been smoking about 0.50 packs per day. He has never used smokeless tobacco. Social: Mr. Larmer  reports that he has been smoking cigarettes. He has been smoking about 0.50 packs per day. He has never used smokeless tobacco. He reports that he does not drink alcohol or use drugs. Medical:  has a past medical history of Arthralgia of lower leg (04/02/2012), Arthralgia of upper arm (06/18/2009), Cancer ([place]), Diabetes mellitus without complication ([place]), FH: atrial fibrillation, and Hypertension. Surgical: Mr. Detwiler  has a past surgical history that includes bladder cancer surgery (2015); Joint replacement; Replacement total hip w/  resurfacing implants (Bilateral); Total knee arthroplasty (Bilateral); and Shoulder surgery  (Bilateral). Family: family history includes Dementia in his mother; Heart disease in his father. Problem List: Mr. Sonneborn has Chronic hip pain (Location of Secondary source of pain) (Bilateral) (R>L); Chronic shoulder pain (Location of Primary Source of Pain) (Bilateral) (L>R); Chronic low back pain (Location of Tertiary source of pain) (Bilateral) (R>L); Chronic knee pain (Bilateral) (R>L); S/P THR: total hip replacement (Bilateral); S/P TKR: total knee replacement (Bilateral); Chronic neck pain; Chondrocalcinosis; Chronic shoulder pain (Radicular); Myofascial pain; Musculoskeletal pain; Muscle spasm; Lumbar facet syndrome (Bilateral) (R>L); Lumbar spondylosis; Chronic pain syndrome; Chronic acromioclavicular joint pain (Left); Osteoarthritis of shoulder (Bilateral) (L>R); 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  Lab Review  Kidney Function Lab Results  Component Value Date   BUN 15 11/18/2015   CREATININE 0.80 11/18/2015   GFRAA >60 11/18/2015   GFRNONAA >60 11/18/2015  Liver Function Lab Results  Component Value Date   AST 19 11/18/2015   ALT 17 11/18/2015   ALBUMIN 4.1 11/18/2015  Note: Above Lab results reviewed.  Imaging Review  DG Lumbar Spine Complete W/Bend CLINICAL DATA:  Low back pain for 1 month radiating down LEFT leg, injured back lifting something heavy, lumbar spondylosis, lumbar facet syndrome  EXAM: LUMBAR SPINE - COMPLETE WITH BENDING VIEWS  COMPARISON:  11/18/2015  FINDINGS: Osseous demineralization.  Five non-rib-bearing lumbar vertebra.  Multilevel endplate spur formation throughout lumbar spine.  Minimal disc space narrowing L4-L5.  Vertebral body heights maintained without fracture or bone destruction.  Minimal retrolisthesis at L5-S1 approximately 3 mm which increases to 6 mm with extension and appears unchanged with flexion.  Remaining  alignments normal it out abnormal motion with flexion or extension.  SI joints preserved.  BILATERAL hip prostheses.  IMPRESSION: Scattered degenerative disc disease changes of the lumbar spine.  Minimal retrolisthesis at L5-S1 which mildly increases with extension and is unchanged with flexion.  Electronically Signed   By: Lavonia Dana M.D.   On: 11/08/2017 11:02 Note: Above imaging results reviewed.        Physical Exam  General appearance: Well nourished, well developed, and well hydrated. In no apparent acute distress Mental status: Alert, oriented x 3 (person, place, & time)       Respiratory: No evidence of acute respiratory distress Eyes: PERLA Vitals: BP 135/60   Pulse (!) 48   Temp 97.7 F (36.5 C)   Ht _0  (1.727 m)   Wt 235 lb (106.6 kg)   SpO2 99%   BMI 35.73 kg/m  BMI: Estimated body mass index is 35.73 kg/m as calculated from the following:   Height as of this encounter: _1  (1.727 m).   Weight as of this encounter: 235 lb (106.6 kg). Ideal: Ideal  body weight: 68.4 kg (150 lb 12.7 oz) Adjusted ideal body weight: 83.7 kg (184 lb 7.6 oz) Upper Extremity (UE) Exam    Side: Right upper extremity  Side: Left upper extremity  Skin & Extremity Inspection: Skin color, temperature, and hair growth are WNL. No peripheral edema or cyanosis. No masses, redness, swelling, asymmetry, or associated skin lesions. No contractures.  Skin & Extremity Inspection: Skin color, temperature, and hair growth are WNL. No peripheral edema or cyanosis. No masses, redness, swelling, asymmetry, or associated skin lesions. No contractures.  Functional ROM: Unrestricted ROM          Functional ROM: Unrestricted ROM          Muscle Tone/Strength: Functionally intact. No obvious neuro-muscular anomalies detected.  Muscle Tone/Strength: Functionally intact. No obvious neuro-muscular anomalies detected.  Sensory (Neurological): Unimpaired          Sensory (Neurological): Unimpaired           Palpation: No palpable anomalies              Palpation: No palpable anomalies              Provocative Test(s):  Phalen's test: deferred Tinel's test: deferred Apley's scratch test (touch opposite shoulder):  Action 1 (Across chest): Adequate ROM Action 2 (Overhead): Adequate ROM Action 3 (LB reach): Improved ROM   Provocative Test(s):  Phalen's test: deferred Tinel's test: deferred Apley's scratch test (touch opposite shoulder):  Action 1 (Across chest): Decreased ROM Action 2 (Overhead): Decreased ROM Action 3 (LB reach): Decreased ROM    Assessment   Status Diagnosis  Controlled Controlled Controlled 1. Osteoarthritis of shoulder (Bilateral) (L>R)   2. Lumbar spondylosis   3. Chronic hip pain (Location of Secondary source of pain) (Bilateral) (R>L)   4. Chronic pain syndrome      Updated Problems: Problem  Atrial Fibrillation (Hcc)   sees by Dr. Newman Pies at [facility] regularly, on warfarin w/stable INRs and digoxin low dose.   Last Assessment & Plan:  INR at goal today at 2. Cont same dose 4 mg (2 mg x 2) on Tue, Thu, Sat; 3 mg (3 mg x 1) all other days r'd warfarin Next INR 4 wks  Overview:  sees by Dr. Newman Pies at [facility] regularly, on warfarin w/stable INRs and digoxin low dose.   Overview:  sees by Dr. Newman Pies at [facility] regularly, on warfarin w/stable INRs and digoxin low dose.   Last Assessment & Plan:  INR at goal today at 2. Cont same dose 4 mg (2 mg x 2) on Tue, Thu, Sat; 3 mg (3 mg x 1) all other days r'd warfarin Next INR 4 wks  Last Assessment & Plan:  Formatting of this note may be different from the original. He sees [place] [facility] yearly and will schedule a visit with them soon, but I will continue to manage his INRs on warfarin and prescribe digoxin at the guidance of his cardiologist.  INR therapeutic 01/26/16 Lab Results  Component Value Date   INR 2.9 01/26/2016   INR 2.4 12/29/2015   INR 2.5 12/01/2015   sees by Dr. Newman Pies at [facility] regularly, on  warfarin w/stable INRs and digoxin low dose.   Overview:  sees by Dr. Newman Pies at [facility] regularly, on warfarin w/stable INRs and digoxin low dose.   Last Assessment & Plan:  INR at goal today at 2. Cont same dose 4 mg (2 mg x 2) on Tue, Thu, Sat; 3 mg (3 mg x 1) all  other days r'd warfarin Next INR 4 wks  Last Assessment & Plan:  Mgmt by Dr. Newman Pies at [facility]. Now off warfarin, on Xarelto, also on digoxin, metrolol for rate control. Recently he requested a digoxin rf from me and I declined, stating this should be rx'd by his cardiologist (who prescribes his crestor, xarelto, lasix, Toprol etc), BUT pt called back saying his cardiologist told him I should refill it, which I did. Today we discussed that I am happy to provide routine refills as long as he keeps his regular visits w/cardiology so that I can see that they still want him to take this med and at what dose Overview:  Paroxysmal.  Atrial fibrillation and flutter diagnosed 2010 pre-op from knee surgery.  No history of anti-arrhythmic drugs or cardioversion.   sees by Dr. Newman Pies at [facility] regularly, on warfarin w/stable INRs and digoxin low dose.   Changed from warfarin to xarelto 2018 by [facility]  Last Assessment & Plan:  Mgmt by Dr. Newman Pies at [facility]. Now off warfarin, on Xarelto, also on digoxin, metrolol for rate control. Recently he requested a digoxin rf from me and I declined, stating this should be rx'd by his cardiologist (who prescribes his crestor, xarelto, lasix, Toprol etc), BUT pt called back saying his cardiologist told him I should refill it, which I did. Today we discussed that I am happy to provide routine refills as long as he keeps his regular visits w/cardiology so that I can see that they still want him to take this med and at what dose  sees by Dr. Newman Pies at [facility] regularly, on warfarin w/stable INRs and digoxin low dose.   Changed from warfarin to xarelto 2018 by [facility]  Last Assessment & Plan:  Mgmt by  Dr. Newman Pies at [place] last [place] 10/19, stable/asymptomatic. Cont Xarelto,digoxin, Teacher, English as a foreign language. We have agreed I'm willing to rf these meds as long as he keeps regular cards visits.   Morbid Obesity (Hcc)   Last Assessment & Plan:  Weight increasing. Check TSH. Urged wt loss. Discussed diet/exerrcise.  Last Assessment & Plan:  BMI 35.81 with weight of 239 lbs. Encourage pt continue working on healthy diet and regular exercise. Recommend a minimum of 2.5 hours/wk of physical activity.  Last Assessment & Plan:  BMI 35.74 with weight of 235 lbs. Encourage pt continue working on healthy diet and regular exercise. Recommend a minimum of 2.5 hours/wk of physical activity. Offered referral to Dr. Florina Ou weight loss clinic and RD, Ellie for dietary counseling- he will consider this but declines today in favor of lifestyle modifications.  Last Assessment & Plan:  BMI 35.43 with weight of 233 lbs. Encourage pt continue working on healthy diet and regular exercise. Recommend a minimum of 2.5 hours/wk of physical activity. Previously offered referral to Dr. Florina Ou weight loss clinic and RD, Ellie for dietary counseling- he will consider this but declined in favor of lifestyle modifications.   Impaired Fasting Glucose   Last Assessment & Plan:  See diabetes tab.   On statin/ARB. No ASA given warfarin use  Last Assessment & Plan:  Disease status: well controlled, A1cs in prediabetes range, off meds  A1c goal: <8.0%; Pt is at goal. A1c 6.2 today from 6.0 on 02/2017   Not currently on any medications for this  Advice pt included: Encourage weight loss via healthy diet and regular exercise. Offered referral to nutritionist, pt declined. Discussed referral to Dr. Florina Ou obesity clinic, pt will think about this. Urine  microalbumin collected. DM HM otherwise UTD.  Checking non-fasted labs today  On statin/ARB. No ASA given warfarin use  Last Assessment & Plan:  Disease status: well controlled, A1cs in  prediabetes range, off meds  A1c goal: <8.0%; Pt is at goal. A1c 6.3 today from 6.2 on 08/2017   Not currently on any medications for this   Recommended using sugar substitutes at home   Informed pt that he will be due for DM eye exam next month Advice pt included: Encourage weight loss via healthy diet and regular exercise. Offered referral to nutritionist, pt declined. Discussed referral to Dr. Florina Ou obesity clinic, pt will think about this. DM HM otherwise UTD.  On statin/ARB. No ASA given warfarin use  Last Assessment & Plan:  Disease status: well controlled, A1cs in prediabetes range, off meds  A1c goal: <8.0%; Pt is at goal. A1c 6.3 today from 6.3 on 02/2018   Not currently on any medications for this   Advised pt to schedule DM eye exam with his eye doctor Advice pt included: Encourage weight loss via healthy diet and regular exercise. Previously offered referral to nutritionist or Dr. Florina Ou weight loss clinic -pt would think about this but declined. DM HM otherwise UTD.   Involuntary Muscle Contractions   Last Assessment & Plan:  Pt tolerating Soma 394m well, continued this. 3 30d rxs escribed (90d total).  Advised pt not to mix rx with ETOH or other medications that promote drowsiness. Drug screen utd (09/2016).Contract updated on 03/31/2017. PDMP showed he last filled his Soma on 06/02/2017 and showed appropriate use. He conts to be prescribed 564moxycodone from Dr. NaHarl Favorut of [place] [facility], they are apparently gradually tapering down his chronic opioid-encouraged pt regarding this.   Last Assessment & Plan:  Pt tolerating Soma 35069mell, continued this. Note written to pts pharmacy requesting rx be filled today (09/27/2017=2d early) given [place]vised [place] not to mix rx with ETOH or other medications that promote drowsiness. UDS collected today. Pt reports he accidentally took the medications from his wife's pill box on  09/26/2017. Pts wife takes bupropion and oxycodone so these meds may affect UDS results.Contract updated on 03/31/2017.PDMP showed he last filled Soma 350m54m 08/30/2017. He continues to get [place] 5mg 22[place]  Last Assessment & Plan:  Pt tolerating Soma 350mg 21mPRN well, continued this. Advised pt not to mix rx with ETOH or other medications that promote drowsiness. UDS from 10/17/2017 was +opiates as expected. Contract updated on 03/31/2017. PDMP showed he last filled Soma 350mg o59m/27/2019. He continues to get oxycodone 5mg fro[place]main [place]  Last Assessment & Plan:  Pt tolerating Soma 350mg BID34m well (uses bid essentially every day), continued this. Advised pt not to mix rx with ETOH or other medications that promote drowsiness. UDS from 10/17/2017 was +opiates as expected. Contract updated on 03/31/2017. PDMP showed he last filled Soma 350mg on 056m/2019. He continues to get oxycodone 5mg from p37m clinic [place]. Plan to update contract at next OV in 71mo.  Last 66mossment & Plan:  Pt tolerating Soma 350mg BID PRN42ml (uses bid essentially every day), continued this. Advised pt not to mix rx with ETOH or other medications that promote drowsiness. UDS from 10/17/2017 was +opiates as expected. Contract updated today. PDMP showed he last filled Soma 350mg on 12/0575m9. He continues  to get oxycodone 32m [place] pain clinic [place].   Of note, pt states that his new insurance does not have Soma on their formulary. Per chart he has failed both cyclobenzaprine and tizanidine in past due to somnolence.  [place] other muscle relaxers he has tried in the past and which SEs he had. [phone] for PA or write letter w/this info after that.     Plan of Care   Medications: I have changed Jeury Waldrip's oxyCODONE, oxyCODONE, and oxyCODONE. I am also having him maintain his carisoprodol, simvastatin, naloxone, digoxin, rosuvastatin, metoprolol succinate, furosemide, metoprolol succinate, rivaroxaban, triamcinolone cream, Cetirizine HCl, acetaminophen, Magnesium, and furosemide.  Administered today: SKorde Jeppsenhad no medications administered during this visit.  Orders:  No orders of the defined types were placed in this encounter.  Interventional options: Planned follow-up:   Not at this time Plan: Return in about 3 months (around 10/13/2018) for MedMgmt.   Considering:  (Stop Coumadin for 5 days prior to procedure) Left sided suprascapular nerve radiofrequency ablation    Palliative PRN treatment(s):  (Stop Coumadin for 5 days prior to procedure) Suprascapular nerve block  Possible suprascapular nerve radiofrequency ablatio     Note by: CDionisio David NP Date: 07/15/2018; Time: 9:15 AM

## 2018-07-15 NOTE — Progress Notes (Signed)
Nursing Pain Medication Assessment:  Safety precautions to be maintained throughout the outpatient stay will include: orient to surroundings, keep bed in low position, maintain call bell within reach at all times, provide assistance with transfer out of bed and ambulation.  Medication Inspection Compliance: Pill count conducted under aseptic conditions, in front of the patient. Neither the pills nor the bottle was removed from the patient's sight at any time. Once count was completed pills were immediately returned to the patient in their original bottle.  Medication: Oxycodone IR Pill/Patch Count: 18 of 120 pills remain Pill/Patch Appearance: Markings consistent with prescribed medication Bottle Appearance: Standard pharmacy container. Clearly labeled. Filled Date: 1 / 99 / 2020 Last Medication intake:  Today

## 2018-07-15 NOTE — Patient Instructions (Signed)
____________________________________________________________________________________________  Medication Rules  Purpose: To inform patients, and their family members, of our rules and regulations.  Applies to: All patients receiving prescriptions (written or electronic).  Pharmacy of record: Pharmacy where electronic prescriptions will be sent. If written prescriptions are taken to a different pharmacy, please inform the nursing staff. The pharmacy listed in the electronic medical record should be the one where you would like electronic prescriptions to be sent.  Electronic prescriptions: In compliance with the [place] Strengthen Opioid Misuse Prevention (STOP) Act of 2017 (Session Law 2017-74/H243), effective May 29, 2018, all controlled substances must be electronically prescribed. Calling prescriptions to the pharmacy will cease to exist.  Prescription refills: Only during scheduled appointments. Applies to all prescriptions.  NOTE: The following applies primarily to controlled substances (Opioid* Pain Medications).   Patient's responsibilities: 1. Pain Pills: Bring all pain pills to every appointment (except for procedure appointments). 2. Pill Bottles: Bring pills in original pharmacy bottle. Always bring the newest bottle. Bring bottle, even if empty. 3. Medication refills: You are responsible for knowing and keeping track of what medications you take and those you need refilled. The day before your appointment: write a list of all prescriptions that need to be refilled. The day of the appointment: give the list to the admitting nurse. Prescriptions will be written only during appointments. No prescriptions will be written on procedure days. If you forget a medication: it will not be "Called in", "Faxed", or "electronically sent". You will need to get another appointment to get these prescribed. No early refills. Do not call asking to have your prescription filled  early. 4. Prescription Accuracy: You are responsible for carefully inspecting your prescriptions before leaving our office. Have the discharge nurse carefully go over each prescription with you, before taking them home. Make sure that your name is accurately spelled, that your address is correct. Check the name and dose of your medication to make sure it is accurate. Check the number of pills, and the written instructions to make sure they are clear and accurate. Make sure that you are given enough medication to last until your next medication refill appointment. 5. Taking Medication: Take medication as prescribed. When it comes to controlled substances, taking less pills or less frequently than prescribed is permitted and encouraged. Never take more pills than instructed. Never take medication more frequently than prescribed.  6. Inform other Doctors: Always inform, all of your healthcare providers, of all the medications you take. 7. Pain Medication from other Providers: You are not allowed to accept any additional pain medication from any other Doctor or Healthcare provider. There are two exceptions to this rule. (see below) In the event that you require additional pain medication, you are responsible for notifying us, as stated below. 8. Medication Agreement: You are responsible for carefully reading and following our Medication Agreement. This must be signed before receiving any prescriptions from our practice. Safely store a copy of your signed Agreement. Violations to the Agreement will result in no further prescriptions. (Additional copies of our Medication Agreement are available upon request.) 9. Laws, Rules, & Regulations: All patients are expected to follow all Federal and State Laws, Statutes, Rules, & Regulations. Ignorance of the Laws does not constitute a valid excuse. The use of any illegal substances is prohibited. 10. Adopted CDC guidelines & recommendations: Target dosing levels will be  at or below 60 MME/day. Use of benzodiazepines** is not recommended.  Exceptions: There are only two exceptions to the rule of not   receiving pain medications from other Healthcare Providers. 1. Exception #1 (Emergencies): In the event of an emergency (i.e.: accident requiring emergency care), you are allowed to receive additional pain medication. However, you are responsible for: As soon as you are able, call our office [phone], at any time of the day or night, and leave a message stating your name, the date and nature of the emergency, and the name and dose of the medication prescribed. In the event that your call is answered by a member of our staff, make sure to document and save the date, time, and the name of the person that took your information.  2. Exception #2 (Planned Surgery): In the event that you are scheduled by another doctor or dentist to have any type of surgery or procedure, you are allowed (for a period no longer than 30 days), to receive additional pain medication, for the acute post-op pain. However, in this case, you are responsible for picking up a copy of our "Post-op Pain Management for Surgeons" handout, and giving it to your surgeon or dentist. This document is available at our office, and does not require an appointment to obtain it. Simply go to our office during business hours (Monday-Thursday from 8:00 AM to 4:00 PM) (Friday 8:00 AM to 12:00 Noon) or if you have a scheduled appointment with us, prior to your surgery, and ask for it by name. In addition, you will need to provide us with your name, name of your surgeon, type of surgery, and date of procedure or surgery.  *Opioid medications include: morphine, codeine, oxycodone, oxymorphone, hydrocodone, hydromorphone, meperidine, tramadol, tapentadol, buprenorphine, fentanyl, methadone. **Benzodiazepine medications include: diazepam (Valium), alprazolam (Xanax), clonazepam (Klonopine), lorazepam (Ativan), clorazepate  (Tranxene), chlordiazepoxide (Librium), estazolam (Prosom), oxazepam (Serax), temazepam (Restoril), triazolam (Halcion) (Last updated: 07/26/2017) ____________________________________________________________________________________________    

## 2018-10-10 ENCOUNTER — Ambulatory Visit: Payer: Medicare Other | Attending: Nurse Practitioner | Admitting: Nurse Practitioner

## 2018-10-10 ENCOUNTER — Other Ambulatory Visit: Payer: Self-pay

## 2018-10-10 DIAGNOSIS — M7918 Myalgia, other site: Secondary | ICD-10-CM

## 2018-10-10 DIAGNOSIS — M19012 Primary osteoarthritis, left shoulder: Secondary | ICD-10-CM

## 2018-10-10 DIAGNOSIS — M19011 Primary osteoarthritis, right shoulder: Secondary | ICD-10-CM | POA: Diagnosis not present

## 2018-10-10 DIAGNOSIS — M5412 Radiculopathy, cervical region: Secondary | ICD-10-CM

## 2018-10-10 DIAGNOSIS — M47816 Spondylosis without myelopathy or radiculopathy, lumbar region: Secondary | ICD-10-CM

## 2018-10-10 DIAGNOSIS — G894 Chronic pain syndrome: Secondary | ICD-10-CM

## 2018-10-10 MED ORDER — OXYCODONE HCL 5 MG PO TABS
5.0000 mg | ORAL_TABLET | Freq: Four times a day (QID) | ORAL | 0 refills | Status: DC | PRN
Start: 2018-10-16 — End: 2018-12-31

## 2018-10-10 MED ORDER — OXYCODONE HCL 5 MG PO TABS: 5.0000 mg | ORAL_TABLET | Freq: Four times a day (QID) | ORAL | 0 refills | Status: DC | PRN

## 2018-10-10 NOTE — Patient Instructions (Signed)
____________________________________________________________________________________________  Medication Rules  Purpose: To inform patients, and their family members, of our rules and regulations.  Applies to: All patients receiving prescriptions (written or electronic).  Pharmacy of record: Pharmacy where electronic prescriptions will be sent. If written prescriptions are taken to a different pharmacy, please inform the nursing staff. The pharmacy listed in the electronic medical record should be the one where you would like electronic prescriptions to be sent.  Electronic prescriptions: In compliance with the [place] Strengthen Opioid Misuse Prevention (STOP) Act of 2017 (Session Law 2017-74/H243), effective May 29, 2018, all controlled substances must be electronically prescribed. Calling prescriptions to the pharmacy will cease to exist.  Prescription refills: Only during scheduled appointments. Applies to all prescriptions.  NOTE: The following applies primarily to controlled substances (Opioid* Pain Medications).   Patient's responsibilities: 1. Pain Pills: Bring all pain pills to every appointment (except for procedure appointments). 2. Pill Bottles: Bring pills in original pharmacy bottle. Always bring the newest bottle. Bring bottle, even if empty. 3. Medication refills: You are responsible for knowing and keeping track of what medications you take and those you need refilled. The day before your appointment: write a list of all prescriptions that need to be refilled. The day of the appointment: give the list to the admitting nurse. Prescriptions will be written only during appointments. No prescriptions will be written on procedure days. If you forget a medication: it will not be "Called in", "Faxed", or "electronically sent". You will need to get another appointment to get these prescribed. No early refills. Do not call asking to have your prescription filled  early. 4. Prescription Accuracy: You are responsible for carefully inspecting your prescriptions before leaving our office. Have the discharge nurse carefully go over each prescription with you, before taking them home. Make sure that your name is accurately spelled, that your address is correct. Check the name and dose of your medication to make sure it is accurate. Check the number of pills, and the written instructions to make sure they are clear and accurate. Make sure that you are given enough medication to last until your next medication refill appointment. 5. Taking Medication: Take medication as prescribed. When it comes to controlled substances, taking less pills or less frequently than prescribed is permitted and encouraged. Never take more pills than instructed. Never take medication more frequently than prescribed.  6. Inform other Doctors: Always inform, all of your healthcare providers, of all the medications you take. 7. Pain Medication from other Providers: You are not allowed to accept any additional pain medication from any other Doctor or Healthcare provider. There are two exceptions to this rule. (see below) In the event that you require additional pain medication, you are responsible for notifying us, as stated below. 8. Medication Agreement: You are responsible for carefully reading and following our Medication Agreement. This must be signed before receiving any prescriptions from our practice. Safely store a copy of your signed Agreement. Violations to the Agreement will result in no further prescriptions. (Additional copies of our Medication Agreement are available upon request.) 9. Laws, Rules, & Regulations: All patients are expected to follow all Federal and State Laws, Statutes, Rules, & Regulations. Ignorance of the Laws does not constitute a valid excuse. The use of any illegal substances is prohibited. 10. Adopted CDC guidelines & recommendations: Target dosing levels will be  at or below 60 MME/day. Use of benzodiazepines** is not recommended.  Exceptions: There are only two exceptions to the rule of not   receiving pain medications from other Healthcare Providers. 1. Exception #1 (Emergencies): In the event of an emergency (i.e.: accident requiring emergency care), you are allowed to receive additional pain medication. However, you are responsible for: As soon as you are able, call our office [phone], at any time of the day or night, and leave a message stating your name, the date and nature of the emergency, and the name and dose of the medication prescribed. In the event that your call is answered by a member of our staff, make sure to document and save the date, time, and the name of the person that took your information.  2. Exception #2 (Planned Surgery): In the event that you are scheduled by another doctor or dentist to have any type of surgery or procedure, you are allowed (for a period no longer than 30 days), to receive additional pain medication, for the acute post-op pain. However, in this case, you are responsible for picking up a copy of our "Post-op Pain Management for Surgeons" handout, and giving it to your surgeon or dentist. This document is available at our office, and does not require an appointment to obtain it. Simply go to our office during business hours (Monday-Thursday from 8:00 AM to 4:00 PM) (Friday 8:00 AM to 12:00 Noon) or if you have a scheduled appointment with us, prior to your surgery, and ask for it by name. In addition, you will need to provide us with your name, name of your surgeon, type of surgery, and date of procedure or surgery.  *Opioid medications include: morphine, codeine, oxycodone, oxymorphone, hydrocodone, hydromorphone, meperidine, tramadol, tapentadol, buprenorphine, fentanyl, methadone. **Benzodiazepine medications include: diazepam (Valium), alprazolam (Xanax), clonazepam (Klonopine), lorazepam (Ativan), clorazepate  (Tranxene), chlordiazepoxide (Librium), estazolam (Prosom), oxazepam (Serax), temazepam (Restoril), triazolam (Halcion) (Last updated: 07/26/2017) ____________________________________________________________________________________________    

## 2018-10-10 NOTE — Progress Notes (Signed)
Pain Management Encounter Note - Virtual Visit via Telephone Telehealth (real-time audio visits between healthcare provider and patient).  Patient's Phone No. & Preferred Pharmacy:  [phone] (home); There is no such number on file (mobile).; (Preferred) [place] [address] [place] [address] Phone: [phone] Fax: [phone]   Pre-screening note:  Our staff contacted Aaron Short and offered him an "in person", "face-to-face" appointment versus a telephone encounter. He indicated preferring the telephone encounter, at this time.  Reason for Virtual Visit: COVID-19*  Social distancing based on CDC and AMA recommendations.   I contacted Aaron Short on 10/10/2018 at 8:32 AM by telephone and clearly identified myself as Dionisio David, NP. I verified that I was speaking with the correct person using two identifiers (Name and date of birth: 1946/01/31).  Advanced Informed Consent I sought verbal advanced consent from Aaron Short for telemedicine interactions and virtual visit. I informed Aaron Short of the security and privacy concerns, risks, and limitations associated with performing an evaluation and management service by telephone. I also informed Aaron Short of the availability of "in person" appointments and I informed him of the possibility of a patient responsible charge related to this service. Aaron Short expressed understanding and agreed to proceed.   Historic Elements   Aaron Short is a 73 y.o. year old, male patient evaluated today after his last encounter by our practice on 07/15/2018. Aaron Short  has a past medical history of Arthralgia of lower leg (04/02/2012), Arthralgia of upper arm (06/18/2009), Cancer ([place]), Diabetes mellitus without complication ([place]), FH: atrial fibrillation, and Hypertension. He also  has a past  surgical history that includes bladder cancer surgery (2015); Joint replacement; Replacement total hip w/  resurfacing implants (Bilateral); Total knee arthroplasty (Bilateral); and Shoulder surgery (Bilateral). Aaron Short has a current medication list which includes the following prescription(s): acetaminophen, carisoprodol, cetirizine hcl, digoxin, furosemide, furosemide, magnesium, metoprolol succinate, metoprolol succinate, naloxone, oxycodone, oxycodone, oxycodone, rivaroxaban, rosuvastatin, simvastatin, and triamcinolone cream. He  reports that he has been smoking cigarettes. He has been smoking about 0.50 packs per day. He has never used smokeless tobacco. He reports that he does not drink alcohol or use drugs. Aaron Short is allergic to diltiazem; penicillins; and tizanidine.   HPI  I last saw him on 07/15/2018. He is being evaluated for medication management.  He has 1-2/10. He has left shoulder an lower back pain. He denies any numbness or tingling in his arm. He denies any numbness or tingling in his legs. He is not able to his arm as in the past but denies any new weakness. He denies any new pain related concerns. He denies a side effects of his medication. He has lost about 6 pounds on purpose.   Pharmacotherapy Assessment  Analgesic:Oxycodone IR 5 mg every 6hours (5 mg/dayof oxycodone)  MME:22.11mEq per day   Monitoring: Pharmacotherapy: No side-effects or adverse reactions reported. [place] PMP: PDMP reviewed during this encounter.       Compliance: No problems identified. Plan: Refer to "POC".  Review of recent tests  DG Lumbar Spine Complete W/Bend CLINICAL DATA:  Low back pain for 1 month radiating down LEFT leg, injured back lifting something heavy, lumbar spondylosis, lumbar facet syndrome  EXAM: LUMBAR SPINE - COMPLETE WITH BENDING VIEWS  COMPARISON:  11/18/2015  FINDINGS: Osseous demineralization.  Five non-rib-bearing lumbar vertebra.  Multilevel  endplate spur formation  throughout lumbar spine.  Minimal disc space narrowing L4-L5.  Vertebral body heights maintained without fracture or bone destruction.  Minimal retrolisthesis at L5-S1 approximately 3 mm which increases to 6 mm with extension and appears unchanged with flexion.  Remaining alignments normal it out abnormal motion with flexion or extension.  SI joints preserved.  BILATERAL hip prostheses.  IMPRESSION: Scattered degenerative disc disease changes of the lumbar spine.  Minimal retrolisthesis at L5-S1 which mildly increases with extension and is unchanged with flexion.  Electronically Signed   By: Lavonia Dana M.D.   On: 11/08/2017 11:02   Clinical Support on 05/15/2018  Component Date Value Ref Range Status  . Summary 05/15/2018 FINAL   Final   Comment: ==================================================================== TOXASSURE SELECT 13 (MW) ==================================================================== Test                             Result       Flag       Units Drug Present and Declared for Prescription Verification   Oxycodone                      650          EXPECTED   ng/mg creat   Oxymorphone                    1864         EXPECTED   ng/mg creat   Noroxycodone                   1931         EXPECTED   ng/mg creat   Noroxymorphone                 630          EXPECTED   ng/mg creat    Sources of oxycodone are scheduled prescription medications.    Oxymorphone, noroxycodone, and noroxymorphone are expected    metabolites of oxycodone. Oxymorphone is also available as a    scheduled prescription medication. ==================================================================== Test                      Result    Flag   Units      Ref Range   Creatinine              135              mg/dL      >=20 ======                          ============================================================== Declared Medications:  The flagging and  interpretation on this report are based on the  following declared medications.  Unexpected results may arise from  inaccuracies in the declared medications.  **Note: The testing scope of this panel includes these medications:  Oxycodone  **Note: The testing scope of this panel does not include following  reported medications:  Acetaminophen (Tylenol)  Carisoprodol (Soma)  Cetirizine  Digoxin (Lanoxin)  Furosemide (Lasix)  Magnesium  Metoprolol  Naloxone  Rivaroxaban  Rosuvastatin (Crestor)  Simvastatin (Zocor)  Triamcinolone (Kenalog) ==================================================================== For clinical consultation, please call [phone]. ====================================================================    Assessment  The primary encounter diagnosis was Chronic shoulder pain (Radicular). Diagnoses of Chronic pain syndrome, Osteoarthritis of shoulder (Bilateral) (L>R), Lumbar spondylosis, Myofascial pain, and Musculoskeletal pain were also pertinent to this visit.  Plan of Care  I am having Aaron Short maintain his carisoprodol, simvastatin, naloxone, digoxin, rosuvastatin, metoprolol succinate, furosemide, metoprolol succinate, rivaroxaban, triamcinolone cream, Cetirizine HCl, acetaminophen, Magnesium, furosemide, oxyCODONE, oxyCODONE, and oxyCODONE.  Pharmacotherapy (Medications Ordered): Meds ordered this encounter  Medications  . oxyCODONE (OXY IR/ROXICODONE) 5 MG immediate release tablet    Sig: Take 1 tablet (5 mg total) by mouth every 6 (six) hours as needed for up to 30 days for severe pain.    Dispense:  120 tablet    Refill:  0    Do not place this medication, or any other prescription from our practice, on "Automatic Refill". Patient may have prescription filled one day early if pharmacy is closed on scheduled refill date.    Order Specific Question:   Supervising Provider    Answer:   Milinda Pointer [phone]  . oxyCODONE (OXY  IR/ROXICODONE) 5 MG immediate release tablet    Sig: Take 1 tablet (5 mg total) by mouth every 6 (six) hours as needed for up to 30 days for severe pain.    Dispense:  120 tablet    Refill:  0    Do not place this medication, or any other prescription from our practice, on "Automatic Refill". Patient may have prescription filled one day early if pharmacy is closed on scheduled refill date.    Order Specific Question:   Supervising Provider    Answer:   Milinda Pointer [phone]  . oxyCODONE (OXY IR/ROXICODONE) 5 MG immediate release tablet    Sig: Take 1 tablet (5 mg total) by mouth every 6 (six) hours as needed for up to 30 days for severe pain.    Dispense:  120 tablet    Refill:  0    Do not place this medication, or any other prescription from our practice, on "Automatic Refill". Patient may have prescription filled one day early if pharmacy is closed on scheduled refill date.    Order Specific Question:   Supervising Provider    Answer:   Milinda Pointer [phone]   Orders:  No orders of the defined types were placed in this encounter.  Follow-up plan:   Return in about 3 months (around 01/10/2019) for MedMgmt.   I discussed the assessment and treatment plan with the patient. The patient was provided an opportunity to ask questions and all were answered. The patient agreed with the plan and demonstrated an understanding of the instructions.  Patient advised to call back or seek an in-person evaluation if the symptoms or condition worsens.  Total duration of non-face-to-face encounter: 11 minutes.  Note by: Dionisio David, NP Date: 10/10/2018; Time: 9:53 AM  Disclaimer:  * Given the special circumstances of the COVID-19 pandemic, the federal government has announced that the Office for Civil Rights (OCR) will exercise its enforcement discretion and will not impose penalties on physicians using telehealth in the event of noncompliance with regulatory requirements under the [place] (HIPAA) in connection with the good faith provision of telehealth during the IRCVE-93 national public health emergency. ([place])

## 2018-12-30 ENCOUNTER — Encounter: Payer: Self-pay | Admitting: Pain Medicine

## 2018-12-31 ENCOUNTER — Telehealth: Payer: Self-pay | Admitting: *Deleted

## 2018-12-31 ENCOUNTER — Other Ambulatory Visit: Payer: Self-pay

## 2018-12-31 ENCOUNTER — Ambulatory Visit: Payer: Medicare Other | Attending: Pain Medicine | Admitting: Pain Medicine

## 2018-12-31 DIAGNOSIS — G894 Chronic pain syndrome: Secondary | ICD-10-CM | POA: Diagnosis not present

## 2018-12-31 DIAGNOSIS — Z79891 Long term (current) use of opiate analgesic: Secondary | ICD-10-CM

## 2018-12-31 DIAGNOSIS — M47816 Spondylosis without myelopathy or radiculopathy, lumbar region: Secondary | ICD-10-CM

## 2018-12-31 DIAGNOSIS — G8929 Other chronic pain: Secondary | ICD-10-CM

## 2018-12-31 DIAGNOSIS — M19012 Primary osteoarthritis, left shoulder: Secondary | ICD-10-CM

## 2018-12-31 DIAGNOSIS — M25559 Pain in unspecified hip: Secondary | ICD-10-CM | POA: Diagnosis not present

## 2018-12-31 DIAGNOSIS — Z96643 Presence of artificial hip joint, bilateral: Secondary | ICD-10-CM

## 2018-12-31 DIAGNOSIS — M792 Neuralgia and neuritis, unspecified: Secondary | ICD-10-CM

## 2018-12-31 DIAGNOSIS — M25511 Pain in right shoulder: Secondary | ICD-10-CM

## 2018-12-31 DIAGNOSIS — M5030 Other cervical disc degeneration, unspecified cervical region: Secondary | ICD-10-CM

## 2018-12-31 DIAGNOSIS — M51379 Other intervertebral disc degeneration, lumbosacral region without mention of lumbar back pain or lower extremity pain: Secondary | ICD-10-CM | POA: Insufficient documentation

## 2018-12-31 DIAGNOSIS — M545 Low back pain, unspecified: Secondary | ICD-10-CM

## 2018-12-31 DIAGNOSIS — M25552 Pain in left hip: Secondary | ICD-10-CM | POA: Insufficient documentation

## 2018-12-31 DIAGNOSIS — M4310 Spondylolisthesis, site unspecified: Secondary | ICD-10-CM | POA: Insufficient documentation

## 2018-12-31 DIAGNOSIS — Z79899 Other long term (current) drug therapy: Secondary | ICD-10-CM | POA: Insufficient documentation

## 2018-12-31 DIAGNOSIS — Z789 Other specified health status: Secondary | ICD-10-CM

## 2018-12-31 DIAGNOSIS — M899 Disorder of bone, unspecified: Secondary | ICD-10-CM

## 2018-12-31 DIAGNOSIS — G8928 Other chronic postprocedural pain: Secondary | ICD-10-CM

## 2018-12-31 DIAGNOSIS — M5137 Other intervertebral disc degeneration, lumbosacral region: Secondary | ICD-10-CM

## 2018-12-31 DIAGNOSIS — F1190 Opioid use, unspecified, uncomplicated: Secondary | ICD-10-CM

## 2018-12-31 DIAGNOSIS — M25551 Pain in right hip: Secondary | ICD-10-CM

## 2018-12-31 DIAGNOSIS — M19011 Primary osteoarthritis, right shoulder: Secondary | ICD-10-CM

## 2018-12-31 DIAGNOSIS — M25512 Pain in left shoulder: Secondary | ICD-10-CM

## 2018-12-31 MED ORDER — OXYCODONE HCL 5 MG PO TABS: 5.0000 mg | ORAL_TABLET | Freq: Four times a day (QID) | ORAL | 0 refills | Status: DC | PRN

## 2018-12-31 MED ORDER — NARCAN 4 MG/0.1ML NA LIQD: 1.0000 | Freq: Once | NASAL | 0 refills | Status: DC

## 2018-12-31 NOTE — Patient Instructions (Signed)
____________________________________________________________________________________________  Drug Holidays (Slow)  What is a "Drug Holiday"? Drug Holiday: is the name given to the period of time during which a patient stops taking a medication(s) for the purpose of eliminating tolerance to the drug.  Benefits . Improved effectiveness of opioids. . Decreased opioid dose needed to achieve benefits. . Improved pain with lesser dose.  What is tolerance? Tolerance: is the progressive decreased in effectiveness of a drug due to its repetitive use. With repetitive use, the body gets use to the medication and as a consequence, it loses its effectiveness. This is a common problem seen with opioid pain medications. As a result, a larger dose of the drug is needed to achieve the same effect that used to be obtained with a smaller dose.  How long should a "Drug Holiday" last? You should stay off of the pain medicine for at least 14 consecutive days. (2 weeks)  Should I stop the medicine "cold turkey"? No. You should always coordinate with your Pain Specialist so that he/she can provide you with the correct medication dose to make the transition as smoothly as possible.  How do I stop the medicine? Slowly. You will be instructed to decrease the daily amount of pills that you take by one (1) pill every seven (7) days. This is called a "slow downward taper" of your dose. For example: if you normally take four (4) pills per day, you will be asked to drop this dose to three (3) pills per day for seven (7) days, then to two (2) pills per day for seven (7) days, then to one (1) per day for seven (7) days, and at the end of those last seven (7) days, this is when the "Drug Holiday" would start.   Will I have withdrawals? By doing a "slow downward taper" like this one, it is unlikely that you will experience any significant withdrawal symptoms. Typically, what triggers withdrawals is the sudden stop of a high  dose opioid therapy. Withdrawals can usually be avoided by slowly decreasing the dose over a prolonged period of time.  What are withdrawals? Withdrawals: refers to the wide range of symptoms that occur after stopping or dramatically reducing opiate drugs after heavy and prolonged use. Withdrawal symptoms do not occur to patients that use low dose opioids, or those who take the medication sporadically. Contrary to benzodiazepine (example: Valium, Xanax, etc.) or alcohol withdrawals ("Delirium Tremens"), opioid withdrawals are not lethal. Withdrawals are the physical manifestation of the body getting rid of the excess receptors.  Expected Symptoms Early symptoms of withdrawal may include: . Agitation . Anxiety . Muscle aches . Increased tearing . Insomnia . Runny nose . Sweating . Yawning  Late symptoms of withdrawal may include: . Abdominal cramping . Diarrhea . Dilated pupils . Goose bumps . Nausea . Vomiting  Will I experience withdrawals? Due to the slow nature of the taper, it is very unlikely that you will experience any.  What is a slow taper? Taper: refers to the gradual decrease in dose.  ___________________________________________________________________________________________    ____________________________________________________________________________________________  Medication Rules  Purpose: To inform patients, and their family members, of our rules and regulations.  Applies to: All patients receiving prescriptions (written or electronic).  Pharmacy of record: Pharmacy where electronic prescriptions will be sent. If written prescriptions are taken to a different pharmacy, please inform the nursing staff. The pharmacy listed in the electronic medical record should be the one where you would like electronic prescriptions to be sent.  Electronic   prescriptions: In compliance with the [place] Strengthen Opioid Misuse Prevention (STOP) Act of 2017 (Session  Law 2017-74/H243), effective May 29, 2018, all controlled substances must be electronically prescribed. Calling prescriptions to the pharmacy will cease to exist.  Prescription refills: Only during scheduled appointments. Applies to all prescriptions.  NOTE: The following applies primarily to controlled substances (Opioid* Pain Medications).   Patient's responsibilities: 1. Pain Pills: Bring all pain pills to every appointment (except for procedure appointments). 2. Pill Bottles: Bring pills in original pharmacy bottle. Always bring the newest bottle. Bring bottle, even if empty. 3. Medication refills: You are responsible for knowing and keeping track of what medications you take and those you need refilled. The day before your appointment: write a list of all prescriptions that need to be refilled. The day of the appointment: give the list to the admitting nurse. Prescriptions will be written only during appointments. No prescriptions will be written on procedure days. If you forget a medication: it will not be "Called in", "Faxed", or "electronically sent". You will need to get another appointment to get these prescribed. No early refills. Do not call asking to have your prescription filled early. 4. Prescription Accuracy: You are responsible for carefully inspecting your prescriptions before leaving our office. Have the discharge nurse carefully go over each prescription with you, before taking them home. Make sure that your name is accurately spelled, that your address is correct. Check the name and dose of your medication to make sure it is accurate. Check the number of pills, and the written instructions to make sure they are clear and accurate. Make sure that you are given enough medication to last until your next medication refill appointment. 5. Taking Medication: Take medication as prescribed. When it comes to controlled substances, taking less pills or less frequently than prescribed is  permitted and encouraged. Never take more pills than instructed. Never take medication more frequently than prescribed.  6. Inform other Doctors: Always inform, all of your healthcare providers, of all the medications you take. 7. Pain Medication from other Providers: You are not allowed to accept any additional pain medication from any other Doctor or Healthcare provider. There are two exceptions to this rule. (see below) In the event that you require additional pain medication, you are responsible for notifying us, as stated below. 8. Medication Agreement: You are responsible for carefully reading and following our Medication Agreement. This must be signed before receiving any prescriptions from our practice. Safely store a copy of your signed Agreement. Violations to the Agreement will result in no further prescriptions. (Additional copies of our Medication Agreement are available upon request.) 9. Laws, Rules, & Regulations: All patients are expected to follow all Federal and State Laws, Statutes, Rules, & Regulations. Ignorance of the Laws does not constitute a valid excuse. The use of any illegal substances is prohibited. 10. Adopted CDC guidelines & recommendations: Target dosing levels will be at or below 60 MME/day. Use of benzodiazepines** is not recommended.  Exceptions: There are only two exceptions to the rule of not receiving pain medications from other Healthcare Providers. 1. Exception #1 (Emergencies): In the event of an emergency (i.e.: accident requiring emergency care), you are allowed to receive additional pain medication. However, you are responsible for: As soon as you are able, call our office [phone], at any time of the day or night, and leave a message stating your name, the date and nature of the emergency, and the name and dose of the medication   prescribed. In the event that your call is answered by a member of our staff, make sure to document and save the date, time, and  the name of the person that took your information.  2. Exception #2 (Planned Surgery): In the event that you are scheduled by another doctor or dentist to have any type of surgery or procedure, you are allowed (for a period no longer than 30 days), to receive additional pain medication, for the acute post-op pain. However, in this case, you are responsible for picking up a copy of our "Post-op Pain Management for Surgeons" handout, and giving it to your surgeon or dentist. This document is available at our office, and does not require an appointment to obtain it. Simply go to our office during business hours (Monday-Thursday from 8:00 AM to 4:00 PM) (Friday 8:00 AM to 12:00 Noon) or if you have a scheduled appointment with us, prior to your surgery, and ask for it by name. In addition, you will need to provide us with your name, name of your surgeon, type of surgery, and date of procedure or surgery.  *Opioid medications include: morphine, codeine, oxycodone, oxymorphone, hydrocodone, hydromorphone, meperidine, tramadol, tapentadol, buprenorphine, fentanyl, methadone. **Benzodiazepine medications include: diazepam (Valium), alprazolam (Xanax), clonazepam (Klonopine), lorazepam (Ativan), clorazepate (Tranxene), chlordiazepoxide (Librium), estazolam (Prosom), oxazepam (Serax), temazepam (Restoril), triazolam (Halcion) (Last updated: 07/26/2017) ____________________________________________________________________________________________   ____________________________________________________________________________________________  Medication Recommendations and Reminders  Applies to: All patients receiving prescriptions (written and/or electronic).  Medication Rules & Regulations: These rules and regulations exist for your safety and that of others. They are not flexible and neither are we. Dismissing or ignoring them will be considered "non-compliance" with medication therapy, resulting in complete and  irreversible termination of such therapy. (See document titled "Medication Rules" for more details.) In all conscience, because of safety reasons, we cannot continue providing a therapy where the patient does not follow instructions.  Pharmacy of record:   Definition: This is the pharmacy where your electronic prescriptions will be sent.   We do not endorse any particular pharmacy.  You are not restricted in your choice of pharmacy.  The pharmacy listed in the electronic medical record should be the one where you want electronic prescriptions to be sent.  If you choose to change pharmacy, simply notify our nursing staff of your choice of new pharmacy.  Recommendations:  Keep all of your pain medications in a safe place, under lock and key, even if you live alone.   After you fill your prescription, take 1 week's worth of pills and put them away in a safe place. You should keep a separate, properly labeled bottle for this purpose. The remainder should be kept in the original bottle. Use this as your primary supply, until it runs out. Once it's gone, then you know that you have 1 week's worth of medicine, and it is time to come in for a prescription refill. If you do this correctly, it is unlikely that you will ever run out of medicine.  To make sure that the above recommendation works, it is very important that you make sure your medication refill appointments are scheduled at least 1 week before you run out of medicine. To do this in an effective manner, make sure that you do not leave the office without scheduling your next medication management appointment. Always ask the nursing staff to show you in your prescription , when your medication will be running out. Then arrange for the receptionist to get you a return   appointment, at least 7 days before you run out of medicine. Do not wait until you have 1 or 2 pills left, to come in. This is very poor planning and does not take into consideration  that we may need to cancel appointments due to bad weather, sickness, or emergencies affecting our staff.  "Partial Fill": If for any reason your pharmacy does not have enough pills/tablets to completely fill or refill your prescription, do not allow for a "partial fill". You will need a separate prescription to fill the remaining amount, which we will not provide. If the reason for the partial fill is your insurance, you will need to talk to the pharmacist about payment alternatives for the remaining tablets, but again, do not accept a partial fill.  Prescription refills and/or changes in medication(s):   Prescription refills, and/or changes in dose or medication, will be conducted only during scheduled medication management appointments. (Applies to both, written and electronic prescriptions.)  No refills on procedure days. No medication will be changed or started on procedure days. No changes, adjustments, and/or refills will be conducted on a procedure day. Doing so will interfere with the diagnostic portion of the procedure.  No phone refills. No medications will be "called into the pharmacy".  No Fax refills.  No weekend refills.  No Holliday refills.  No after hours refills.  Remember:  Business hours are:  Monday to Thursday 8:00 AM to 4:00 PM Provider's Schedule: Crystal King, NP - Appointments are:  Medication management: Monday to Thursday 8:00 AM to 4:00 PM Maesyn Frisinger, MD - Appointments are:  Medication management: Monday and Wednesday 8:00 AM to 4:00 PM Procedure day: Tuesday and Thursday 7:30 AM to 4:00 PM Bilal Lateef, MD - Appointments are:  Medication management: Tuesday and Thursday 8:00 AM to 4:00 PM Procedure day: Monday and Wednesday 7:30 AM to 4:00 PM (Last update:  07/26/2017) ____________________________________________________________________________________________   ____________________________________________________________________________________________  CANNABIDIOL (AKA: CBD Oil or Pills)  Applies to: All patients receiving prescriptions of controlled substances (written and/or electronic).  General Information: Cannabidiol (CBD) was discovered in 1940. It is one of some 113 identified cannabinoids in cannabis (Marijuana) plants, accounting for up to 40% of the plant's extract. As of 2018, preliminary clinical research on cannabidiol included studies of anxiety, cognition, movement disorders, and pain.  Cannabidiol is consummed in multiple ways, including inhalation of cannabis smoke or vapor, as an aerosol spray into the cheek, and by mouth. It may be supplied as CBD oil containing CBD as the active ingredient (no added tetrahydrocannabinol (THC) or terpenes), a full-plant CBD-dominant hemp extract oil, capsules, dried cannabis, or as a liquid solution. CBD is thought not have the same psychoactivity as THC, and may affect the actions of THC. Studies suggest that CBD may interact with different biological targets, including cannabinoid receptors and other neurotransmitter receptors. As of 2018 the mechanism of action for its biological effects has not been determined.  In the [place], cannabidiol has a limited approval by the Food and Drug Administration (FDA) for treatment of only two types of epilepsy disorders. The side effects of long-term use of the drug include somnolence, decreased appetite, diarrhea, fatigue, malaise, weakness, sleeping problems, and others.  CBD remains a Schedule I drug prohibited for any use.  Legality: Some manufacturers ship CBD products nationally, an illegal action which the FDA has not enforced in 2018, with CBD remaining the subject of an FDA investigational new drug evaluation, and is not considered legal as  a dietary supplement   or food ingredient as of December 2018. Federal illegality has made it difficult historically to conduct research on CBD. CBD is openly sold in head shops and health food stores in some states where such sales have not been explicitly legalized.  Warning: Because it is not FDA approved for general use or treatment of pain, it is not required to undergo the same manufacturing controls as prescription drugs.  This means that the available cannabidiol (CBD) may be contaminated with THC.  If this is the case, it will trigger a positive urine drug screen (UDS) test for cannabinoids (Marijuana).  Because a positive UDS for illicit substances is a violation of our medication agreement, your opioid analgesics (pain medicine) may be permanently discontinued. (Last update: 08/16/2017) ____________________________________________________________________________________________    

## 2018-12-31 NOTE — Progress Notes (Signed)
Pain Management Virtual Encounter Note - Virtual Visit via Telephone Telehealth (real-time audio visits between healthcare provider and patient).   Patient's Phone No. & Preferred Pharmacy:  [phone] (home); There is no such number on file (mobile).; (Preferred) [phone] [email or web address] .[facility] [phone] Shari Prows, [place] [address] [place] [address] Phone: [phone] Fax: [phone]    Pre-screening note:  Our staff contacted Aaron Short and offered him an "in person", "face-to-face" appointment versus a telephone encounter. He indicated preferring the telephone encounter, at this time.   Reason for Virtual Visit: COVID-19*  Social distancing based on CDC and AMA recommendations.   I contacted Samarth Ogle on 12/31/2018 via telephone.      I clearly identified myself as Gaspar Cola, MD. I verified that I was speaking with the correct person using two identifiers (Name: Aaron Short, and date of birth: 08-08-1945).  Advanced Informed Consent I sought verbal advanced consent from Aaron Short for virtual visit interactions. I informed Aaron Short of possible security and privacy concerns, risks, and limitations associated with providing "not-in-person" medical evaluation and management services. I also informed Aaron Short of the availability of "in-person" appointments. Finally, I informed him that there would be a charge for the virtual visit and that he could be  personally, fully or partially, financially responsible for it. Mr. Kahrs expressed understanding and agreed to proceed.   Historic Elements   Aaron Short is a 73 y.o. year old, male patient evaluated today after his last encounter by our practice on 10/10/2018. Aaron Short  has a past medical history of Arthralgia of lower leg (04/02/2012), Arthralgia of upper arm (06/18/2009), Cancer  ([place]), Diabetes mellitus without complication ([place]), FH: atrial fibrillation, and Hypertension. He also  has a past surgical history that includes bladder cancer surgery (2015); Joint replacement; Replacement total hip w/  resurfacing implants (Bilateral); Total knee arthroplasty (Bilateral); and Shoulder surgery (Bilateral). Aaron Short has a current medication list which includes the following prescription(s): acetaminophen, carisoprodol, cetirizine hcl, digoxin, furosemide, magnesium, metoprolol succinate, narcan, oxycodone, oxycodone, oxycodone, rivaroxaban, simvastatin, and triamcinolone cream. He  reports that he has been smoking cigarettes. He has been smoking about 0.50 packs per day. He has never used smokeless tobacco. He reports that he does not drink alcohol or use drugs. Aaron Short is allergic to diltiazem; penicillins; and tizanidine.   HPI  Today, he is being contacted for medication management.  Today the patient indicated that he is no longer on the Coumadin, but he has been on Xarelto for approximately 6 months now.  Today we talked about the possibility of doing a drug holiday.  I went over the results of his recent x-rays and I explained to him about the L5-S1 retrolisthesis with displacement from 3 mm to 6 mm on extension and how that could be responsible for some of his low back pain.  I offered to do a diagnostic lumbar facet block and he indicated that he would be thinking about it   Pharmacotherapy Assessment  Analgesic: Oxycodone IR 5 mg, 1 tab PO q 6hrs (20 mg/dayof oxycodone) MME: 51m/day.  When this patient first came to me around 2016, he had an MME of 105 mg/day.   Monitoring: Pharmacotherapy: No side-effects or adverse reactions reported. [place] PMP: PDMP reviewed during this encounter.       Compliance: No problems identified. Effectiveness: Clinically acceptable. Plan: Refer to "POC".  UDS:  Summary  Date Value Ref Range Status  05/15/2018 FINAL  Final     Comment:    ==================================================================== TOXASSURE SELECT 13 (MW) ==================================================================== Test                             Result       Flag       Units Drug Present and Declared for Prescription Verification   Oxycodone                      650          EXPECTED   ng/mg creat   Oxymorphone                    1864         EXPECTED   ng/mg creat   Noroxycodone                   1931         EXPECTED   ng/mg creat   Noroxymorphone                 630          EXPECTED   ng/mg creat    Sources of oxycodone are scheduled prescription medications.    Oxymorphone, noroxycodone, and noroxymorphone are expected    metabolites of oxycodone. Oxymorphone is also available as a    scheduled prescription medication. ==================================================================== Test                      Result    Flag   Units      Ref Range   Creatinine              135              mg/dL      >=20 ==================================================================== Declared Medications:  The flagging and interpretation on this report are based on the  following declared medications.  Unexpected results may arise from  inaccuracies in the declared medications.  **Note: The testing scope of this panel includes these medications:  Oxycodone  **Note: The testing scope of this panel does not include following  reported medications:  Acetaminophen (Tylenol)  Carisoprodol (Soma)  Cetirizine  Digoxin (Lanoxin)  Furosemide (Lasix)  Magnesium  Metoprolol  Naloxone  Rivaroxaban  Rosuvastatin (Crestor)  Simvastatin (Zocor)  Triamcinolone (Kenalog) ==================================================================== For clinical consultation, please call [phone]. ====================================================================    Laboratory Chemistry Profile (12 mo)  Renal: No results found  for requested labs within last 8760 hours.  Hepatic: No results found for requested labs within last 8760 hours. Other: No results found for requested labs within last 8760 hours. Note: Above Lab results reviewed.  Imaging  Last 90 days:  No results found. Last Hospital Admission:  Dg Lumbar Spine Complete W/bend  Result Date: 11/08/2017 CLINICAL DATA:  Low back pain for 1 month radiating down LEFT leg, injured back lifting something heavy, lumbar spondylosis, lumbar facet syndrome EXAM: LUMBAR SPINE - COMPLETE WITH BENDING VIEWS COMPARISON:  11/18/2015 FINDINGS: Osseous demineralization. Five non-rib-bearing lumbar vertebra. Multilevel endplate spur formation throughout lumbar spine. Minimal disc space narrowing L4-L5. Vertebral body heights maintained without fracture or bone destruction. Minimal retrolisthesis at L5-S1 approximately 3 mm which increases to 6 mm with extension and appears unchanged with flexion. Remaining alignments normal it out abnormal motion with flexion or extension. SI joints preserved. BILATERAL  hip prostheses. IMPRESSION: Scattered degenerative disc disease changes of the lumbar spine. Minimal retrolisthesis at L5-S1 which mildly increases with extension and is unchanged with flexion. Electronically Signed   By: Lavonia Dana M.D.   On: 11/08/2017 11:02   Assessment  The primary encounter diagnosis was Chronic pain syndrome. Diagnoses of Chronic shoulder pain (Primary Area of Pain) (Bilateral) (L>R), Chronic hip pain (Secondary area of Pain) (Bilateral) (R>L), Chronic low back pain (Third area of Pain) (Bilateral) (R>L), Pharmacologic therapy, Disorder of skeletal system, Problems influencing health status, Osteoarthritis of shoulder (Bilateral) (L>R), Lumbar spondylosis, Long term current use of opiate analgesic, Long-term use of high-risk medication, Opiate use (30 MME/Day), Lumbar facet syndrome (Bilateral) (R>L), DDD (degenerative disc disease), cervical, DDD (degenerative  disc disease), lumbosacral, Lumbosacral Grade 1 Retrolisthesis of L5/S1, Neurogenic pain, and Chronic hip pain after THR (Bilateral) were also pertinent to this visit.  Plan of Care  I have discontinued Aaron Short's rosuvastatin, oxyCODONE, and oxyCODONE. I have changed his naloxone to Narcan. I have also changed his oxyCODONE. Additionally, I am having him start on oxyCODONE and oxyCODONE. Lastly, I am having him maintain his carisoprodol, simvastatin, digoxin, rivaroxaban, triamcinolone cream, Cetirizine HCl, acetaminophen, Magnesium, furosemide, and metoprolol succinate.  Pharmacotherapy (Medications Ordered): Meds ordered this encounter  Medications  . naloxone (NARCAN) 4 MG/0.1ML LIQD nasal spray kit    Sig: Place 1 spray into the nose once for 1 dose. Spray half of bottle content into each nostril, then call 911    Dispense:  2 each    Refill:  0    Please instruct patient in the proper use of the medication.  Marland Kitchen oxyCODONE (OXY IR/ROXICODONE) 5 MG immediate release tablet    Sig: Take 1 tablet (5 mg total) by mouth every 6 (six) hours as needed for severe pain. Must last 30 days    Dispense:  120 tablet    Refill:  0    Chronic Pain: STOP Act (Not applicable) Fill 1 day early if closed on refill date. Do not fill until: 01/14/2019. To last until: 02/13/2019. Avoid benzodiazepines within 8 hours of opioids  . oxyCODONE (OXY IR/ROXICODONE) 5 MG immediate release tablet    Sig: Take 1 tablet (5 mg total) by mouth every 6 (six) hours as needed for severe pain. Must last 30 days    Dispense:  120 tablet    Refill:  0    Chronic Pain: STOP Act (Not applicable) Fill 1 day early if closed on refill date. Do not fill until: 02/13/2019. To last until: 03/15/2019. Avoid benzodiazepines within 8 hours of opioids  . oxyCODONE (OXY IR/ROXICODONE) 5 MG immediate release tablet    Sig: Take 1 tablet (5 mg total) by mouth every 6 (six) hours as needed for severe pain. Must last 30 days     Dispense:  120 tablet    Refill:  0    Chronic Pain: STOP Act (Not applicable) Fill 1 day early if closed on refill date. Do not fill until: 03/15/2019. To last until: 04/14/2019. Avoid benzodiazepines within 8 hours of opioids   Orders:  Orders Placed This Encounter  Procedures  . ToxASSURE Select 13 (MW), Urine    Volume: 30 ml(s). Minimum 3 ml of urine is needed. Document temperature of fresh sample. Indications: Long term (current) use of opiate analgesic (Z79.891)  . Comp. Metabolic Panel (12)    With GFR. Indications: Chronic Pain Syndrome (G89.4) & Pharmacotherapy (M75.449)    Order Specific Question:   Has  the patient fasted?    Answer:   No    Order Specific Question:   CC Results    Answer:   PCP-NURSE [[id number]]  . Magnesium    Indication: Pharmacologic therapy (S82.707)    Order Specific Question:   CC Results    Answer:   PCP-NURSE [[id number]]  . Vitamin B12    Indication: Pharmacologic therapy (B20.100).    Order Specific Question:   CC Results    Answer:   PCP-NURSE [[id number]]  . Sedimentation rate    Indication: Disorder of skeletal system (M89.9)    Order Specific Question:   CC Results    Answer:   PCP-NURSE [[id number]]  . 25-Hydroxyvitamin D Lcms D2+D3    Indication: Disorder of skeletal system (M89.9).    Order Specific Question:   CC Results    Answer:   PCP-NURSE [[id number]]  . C-reactive protein    Indication: Problems influencing health status (Z78.9)    Order Specific Question:   CC Results    Answer:   PCP-NURSE [[id number]]   Follow-up plan:   Return in about 3 months (around 04/14/2019) for (VV), E/M (MM).     Interventional therapies:  Considering:   NOTE: XARELTO ANTICOAGULATION (Stop: 3 days  Re-start: 6 hrs)  Diagnostic bilateral lumbar facet block #1  Possible bilateral lumbar facet RFA  Diagnostic bilateral intra-articular shoulder joint injection  Diagnostic bilateral suprascapular nerve block  Possible bilateral suprascapular nerve RFA  Diagnostic  bilateral acromioclavicular joint injection  Diagnostic midline cervical epidural steroid injection  Diagnostic bilateral cervical facet block  Possible bilateral cervical facet RFA    Palliative PRN treatment(s):   Diagnostic left suprascapular NB #2     Recent Visits Date Type Provider Dept  10/10/18 Office Visit Vevelyn Francois, NP [facility]  Showing recent visits within past 90 days and meeting all other requirements   Today's Visits Date Type Provider Dept  12/31/18 Office Visit Milinda Pointer, MD [facility]  Showing today's visits and meeting all other requirements   Future Appointments No visits were found meeting these conditions.  Showing future appointments within next 90 days and meeting all other requirements   I discussed the assessment and treatment plan with the patient. The patient was provided an opportunity to ask questions and all were answered. The patient agreed with the plan and demonstrated an understanding of the instructions.  Patient advised to call back or seek an in-person evaluation if the symptoms or condition worsens.  Total duration of non-face-to-face encounter: 12 minutes.  Note by: Gaspar Cola, MD Date: 12/31/2018; Time: 2:03 PM  Note: This dictation was prepared with Dragon dictation. Any transcriptional errors that may result from this process are unintentional.  Disclaimer:  * Given the special circumstances of the COVID-19 pandemic, the federal government has announced that the Office for Civil Rights (OCR) will exercise its enforcement discretion and will not impose penalties on physicians using telehealth in the event of noncompliance with regulatory requirements under the [place] and [place] (HIPAA) in connection with the good faith provision of telehealth during the MMHWK-08 national public health emergency. ([place])

## 2019-01-08 ENCOUNTER — Ambulatory Visit: Payer: Medicare Other | Admitting: Pain Medicine

## 2019-01-08 IMAGING — MR MR SHOULDER*L* W/O CM
5 series · 40 of 40 positions shown · non-contrast
Comparison: Radiographs 11/18/2015

CLINICAL DATA: Shoulder pain and limited range of motion. Remote
history of shoulder surgery.

EXAM:
MRI OF THE LEFT SHOULDER WITHOUT CONTRAST
TECHNIQUE: Multiplanar, multisequence MR imaging of the shoulder was performed.
No intravenous contrast was administered.

[Series 3: T2 fat-sat · axial · 4.0mm · 0.59mm/px · z∈[-52,+49]mm · 8 of 24 slices shown (1 of 3)]
[im 1/24]
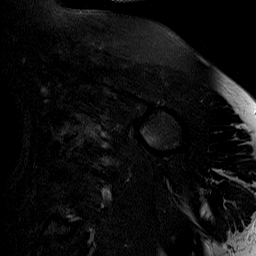
[im 4/24]
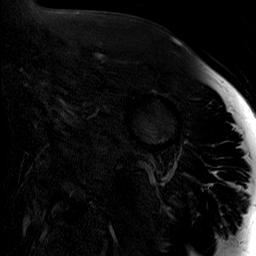
[im 7/24]
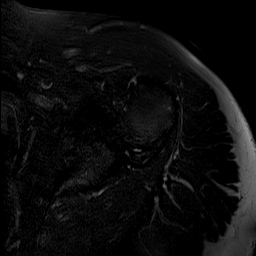
[im 10/24]
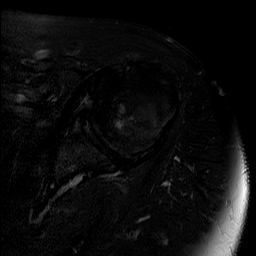
[im 14/24]
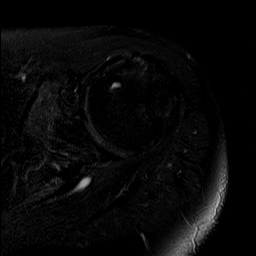
[im 17/24]
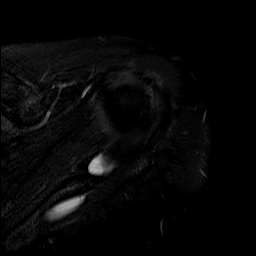
[im 20/24]
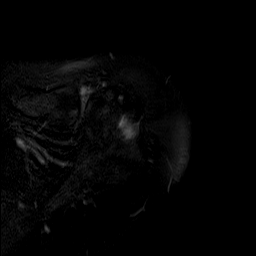
[im 24/24]
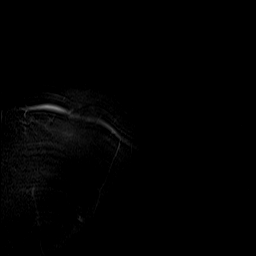

[Series 4: T2 fat-sat · coronal · 4.0mm · 0.59mm/px · 8 of 22 slices shown (2 of 3)]
[im 1/22]
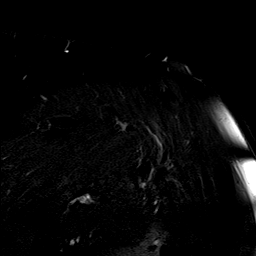
[im 4/22]
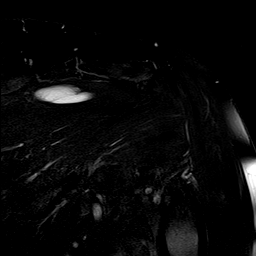
[im 7/22]
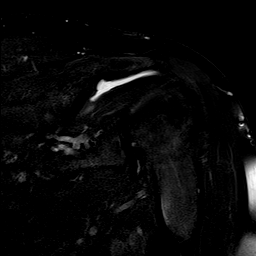
[im 10/22]
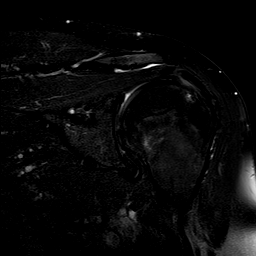
[im 13/22]
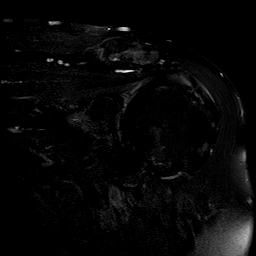
[im 16/22]
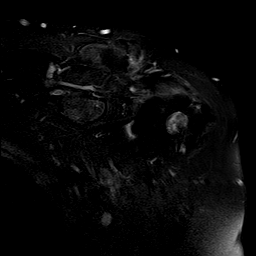
[im 19/22]
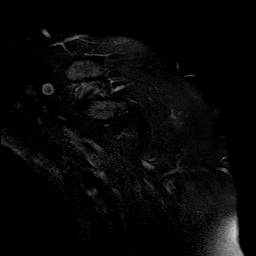
[im 22/22]
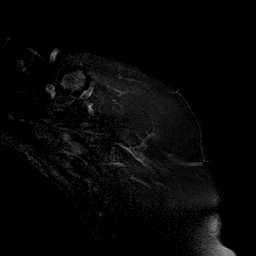

[Series 5: PD · coronal · 4.0mm · 0.59mm/px · 8 of 22 slices shown]
[im 1/22]
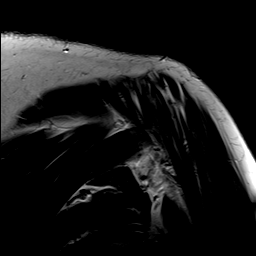
[im 4/22]
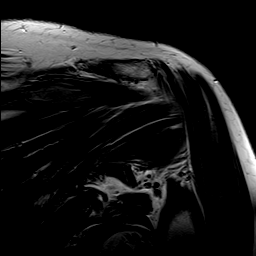
[im 7/22]
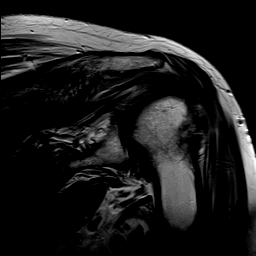
[im 10/22]
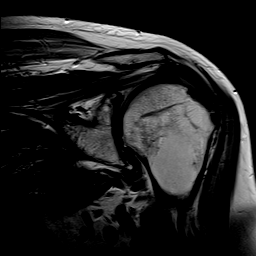
[im 13/22]
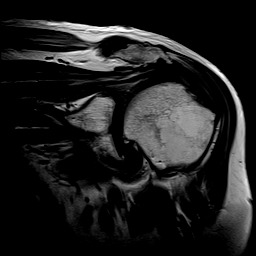
[im 16/22]
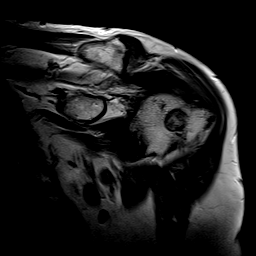
[im 19/22]
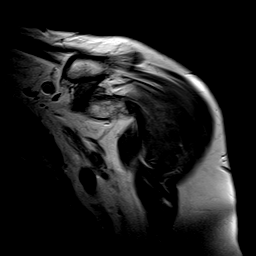
[im 22/22]
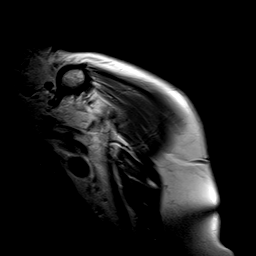

[Series 6: T1 · sagittal · 4.0mm · 0.59mm/px · 8 of 21 slices shown]
[im 1/21]
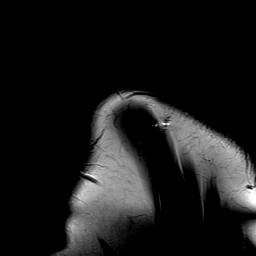
[im 3/21]
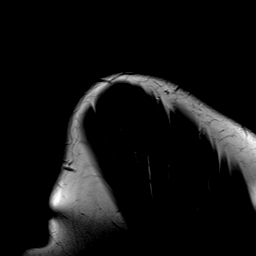
[im 6/21]
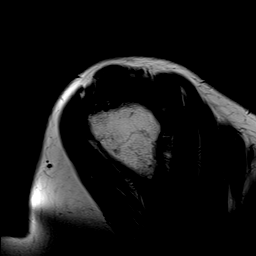
[im 9/21]
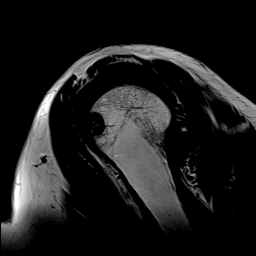
[im 12/21]
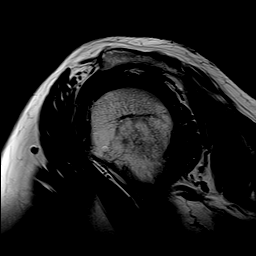
[im 15/21]
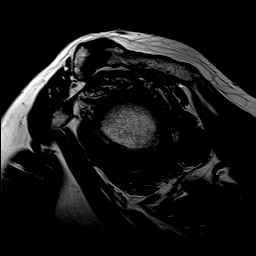
[im 18/21]
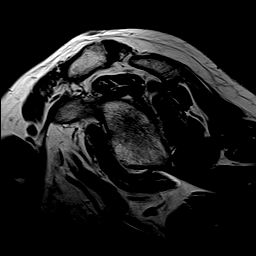
[im 21/21]
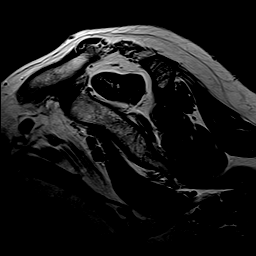

[Series 7: T2 fat-sat · sagittal · 4.0mm · 0.59mm/px · 8 of 21 slices shown (3 of 3)]
[im 1/21]
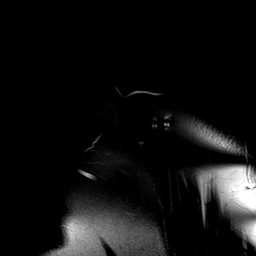
[im 3/21]
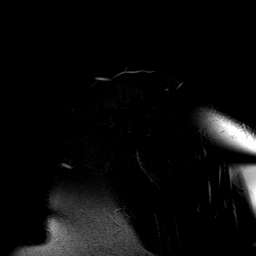
[im 6/21]
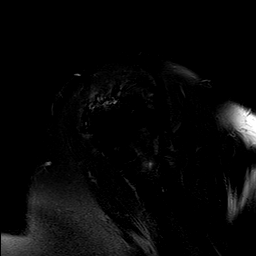
[im 9/21]
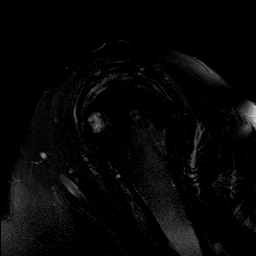
[im 12/21]
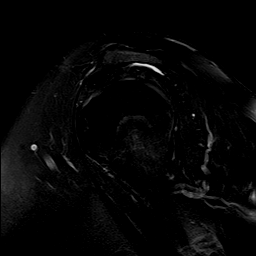
[im 15/21]
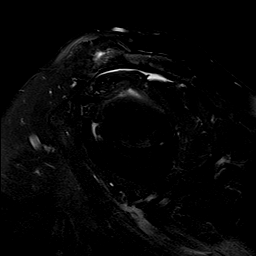
[im 18/21]
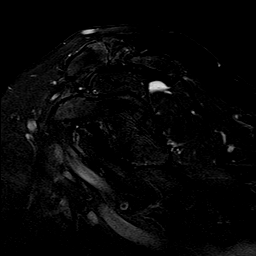
[im 21/21]
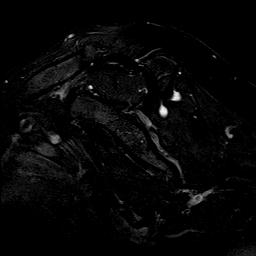

[40 of 40 positions shown; findings below may reference images not displayed]

FINDINGS: Rotator cuff: Significant supraspinatus and infraspinatus
tendinopathy/tendinosis with articular surface and interstitial
tears extending back toward the musculotendinous junction region. No
full-thickness retracted tear but probably at risk for such. The
subscapularis tendon is intact.

Muscles: Mild fatty atrophy of the shoulder musculature, in
particular the supraspinatus and infraspinatus muscles. There are
intramuscular ganglion cysts in the infraspinatus muscle likely
associated with articular surface tears.

Biceps long head: Not visualized on any of the imaging sequences and
likely chronically torn and retracted.

Acromioclavicular Joint: Moderate degenerative changes. Type 1-2
acromion. Mild lateral downsloping and undersurface spurring.

Glenohumeral Joint: Moderate degenerative changes with joint space
narrowing an osteophytic spurring. Moderate degenerative chondrosis
and small joint effusion. There is thickening of the capsular
structures in the axillary recess which can be seen with adhesive
capsulitis or synovitis.

Labrum: The superior labrum is degenerated but no obvious tear. The
anterior posterior labrum are grossly intact.

Bones: No acute bony findings. Subchondral cystic change noted in
the humeral head.

Other: Moderate subacromial/ subdeltoid bursitis.
IMPRESSION: 1. Significant rotator cuff tendinopathy/tendinosis with
interstitial and articular surface tears involving the supraspinatus
and infraspinatus tendons. No full thickness retracted tear.
2. Likely chronically torn and retracted long head biceps tendon.
3. Moderate AC joint degenerative changes with mild lateral
downsloping and undersurface spurring may contribute to bony
impingement.
4. Moderate glenohumeral joint degenerative changes with synovitis
or adhesive capsulitis.
5. Moderate subacromial/subdeltoid bursitis

## 2019-02-15 LAB — 25-HYDROXY VITAMIN D LCMS D2+D3
25-Hydroxy, Vitamin D-2: <1 ng/mL
25-Hydroxy, Vitamin D-3: 32 ng/mL
25-Hydroxy, Vitamin D: 32 ng/mL

## 2019-02-15 LAB — COMP. METABOLIC PANEL (12)
AST: 15 IU/L (ref 0–40)
Albumin/Globulin Ratio: 1.7 (ref 1.2–2.2)
Albumin: 4 g/dL (ref 3.7–4.7)
Alkaline Phosphatase: 89 IU/L (ref 39–117)
BUN/Creatinine Ratio: 13 (ref 10–24)
BUN: 11 mg/dL (ref 8–27)
Bilirubin Total: 0.3 mg/dL (ref 0.0–1.2)
Calcium: 9.4 mg/dL (ref 8.6–10.2)
Chloride: 102 mmol/L (ref 96–106)
Creatinine, Ser: 0.85 mg/dL (ref 0.76–1.27)
GFR calc Af Amer: 99 mL/min/{1.73_m2} (ref >59)
GFR calc non Af Amer: 85 mL/min/{1.73_m2} (ref >59)
Globulin, Total: 2.4 g/dL (ref 1.5–4.5)
Glucose: 113 mg/dL — ABNORMAL HIGH (ref 65–99)
Potassium: 4.9 mmol/L (ref 3.5–5.2)
Sodium: 141 mmol/L (ref 134–144)
Total Protein: 6.4 g/dL (ref 6.0–8.5)

## 2019-02-15 LAB — VITAMIN B12: Vitamin B-12: 372 pg/mL (ref 232–1245)

## 2019-02-15 LAB — C-REACTIVE PROTEIN: CRP: 7 mg/L (ref 0–10)

## 2019-02-15 LAB — MAGNESIUM: Magnesium: 1.9 mg/dL (ref 1.6–2.3)

## 2019-02-15 LAB — SEDIMENTATION RATE: Sed Rate: 21 mm/hr (ref 0–30)

## 2019-02-24 LAB — TOXASSURE SELECT 13 (MW), URINE

## 2019-03-10 DIAGNOSIS — M546 Pain in thoracic spine: Secondary | ICD-10-CM | POA: Insufficient documentation

## 2019-03-10 DIAGNOSIS — M25512 Pain in left shoulder: Secondary | ICD-10-CM | POA: Insufficient documentation

## 2019-03-10 DIAGNOSIS — G8929 Other chronic pain: Secondary | ICD-10-CM | POA: Insufficient documentation

## 2019-04-03 ENCOUNTER — Encounter: Payer: Self-pay | Admitting: Pain Medicine

## 2019-04-06 NOTE — Progress Notes (Signed)
Pain Management Virtual Encounter Note - Virtual Visit via Telephone Telehealth (real-time audio visits between healthcare provider and patient).   Patient's Phone No. & Preferred Pharmacy:  [phone] (home); There is no such number on file (mobile).; (Preferred) [phone] [email or web address]  [id number] [address], [place] [address] Phone: [phone] Fax: [phone]    Pre-screening note:  Our staff contacted Aaron Short and offered him an "in person", "face-to-face" appointment versus a telephone encounter. He indicated preferring the telephone encounter, at this time.   Reason for Virtual Visit: COVID-19*  Social distancing based on CDC and AMA recommendations.   I contacted Aaron Short on 04/07/2019 via telephone.      I clearly identified myself as Gaspar Cola, MD. I verified that I was speaking with the correct person using two identifiers (Name: Aaron Short, and date of birth: Feb 16, 1946).  Advanced Informed Consent I sought verbal advanced consent from Aaron Short for virtual visit interactions. I informed Aaron Short of possible security and privacy concerns, risks, and limitations associated with providing "not-in-person" medical evaluation and management services. I also informed Aaron Short of the availability of "in-person" appointments. Finally, I informed him that there would be a charge for the virtual visit and that he could be  personally, fully or partially, financially responsible for it. Aaron Short expressed understanding and agreed to proceed.   Historic Elements   Aaron Short is a 73 y.o. year old, male patient evaluated today after his last encounter by our practice on 12/31/2018. Aaron Short  has a past medical history of Arthralgia of lower leg (04/02/2012), Arthralgia of upper arm (06/18/2009), Cancer ([place]), Diabetes mellitus without complication  ([place]), FH: atrial fibrillation, and Hypertension. He also  has a past surgical history that includes bladder cancer surgery (2015); Joint replacement; Replacement total hip w/  resurfacing implants (Bilateral); Total knee arthroplasty (Bilateral); and Shoulder surgery (Bilateral). Aaron Short has a current medication list which includes the following prescription(s): acetaminophen, carisoprodol, digoxin, furosemide, magnesium, metoprolol succinate, oxycodone, oxycodone, oxycodone, rivaroxaban, rosuvastatin, triamcinolone cream, and narcan. He  reports that he has been smoking cigarettes. He has been smoking about 0.50 packs per day. He has never used smokeless tobacco. He reports that he does not drink alcohol or use drugs. Aaron Short is allergic to diltiazem; penicillins; and tizanidine.   HPI  Today, he is being contacted for medication management.  The patient indicates doing well with the current medication regimen. No adverse reactions or side effects reported to the medications.  The patient indicates having fallen approximately 5 to 6 weeks ago while he was in [place].  They did some x-rays of his back and it did not show any problems.  He indicates continued to have some pain on the left side under his shoulder blade.  Today he has indicated that should this continue, he will let us know so that we can take a look at it and perhaps treated with some injections.  Having said this, he typically will avoid those as much as possible.  Pharmacotherapy Assessment  Analgesic: Oxycodone IR 5 mg, 1 tab PO q 6hrs (20 mg/dayof oxycodone) MME: 30mg /day.  When this patient first came to me around 2016, he had an MME of 105 mg/day.   Monitoring: Pharmacotherapy: No side-effects or adverse reactions reported. [place] PMP: PDMP reviewed during this encounter.       Compliance: No problems identified. Effectiveness: Clinically acceptable. Plan: Refer to "POC".  UDS:  Summary  Date Value  Ref Range Status  02/20/2019 Note  Final    Comment:    ==================================================================== Tox[employer] Select 13 (MW) ==================================================================== Test                             Result       Flag       Units Drug Present   Oxycodone                      3557                    ng/mg creat   Oxymorphone                    >4274                   ng/mg creat   Noroxycodone                   >4274                   ng/mg creat   Noroxymorphone                 2144                    ng/mg creat    Sources of oxycodone are scheduled prescription medications.    Oxymorphone, noroxycodone, and noroxymorphone are expected    metabolites of oxycodone. Oxymorphone is also available as a    scheduled prescription medication. ==================================================================== Test                      Result    Flag   Units      Ref Range   Creatinine              234              mg/dL      >=20 ==================================================================== Declared Medications:  Medication list was not provided. ==================================================================== For clinical consultation, please call [phone]. ====================================================================    Laboratory Chemistry Profile (12 mo)  Renal: 02/10/2019: BUN 11; BUN/Creatinine Ratio 13; Creatinine, Ser 0.85  Lab Results  Component Value Date   GFRAA 99 02/10/2019   GFRNONAA 85 02/10/2019   Hepatic: 02/10/2019: Albumin 4.0 Lab Results  Component Value Date   AST 15 02/10/2019   ALT 17 11/18/2015   Other: 02/10/2019: 25-Hydroxy, Vitamin D 32; 25-Hydroxy, Vitamin D-2 <1.0; 25-Hydroxy, Vitamin D-3 32; CRP 7; Sed Rate 21; Vitamin B-12 372 Note: Above Lab results reviewed.  Imaging  Last 90 days:  No results found.  Assessment  The primary encounter diagnosis was Chronic pain  syndrome. Diagnoses of Chronic shoulder pain (Primary Area of Pain) (Bilateral) (L>R), Chronic hip pain (Secondary area of Pain) (Bilateral) (R>L), Chronic low back pain (Third area of Pain) (Bilateral) (R>L), Osteoarthritis of shoulder (Bilateral) (L>R), and Lumbar spondylosis were also pertinent to this visit.  Plan of Care  I have discontinued Rejino Bransfield's simvastatin and Cetirizine HCl. I am also having him start on oxyCODONE and oxyCODONE. Additionally, I am having him maintain his carisoprodol, digoxin, rivaroxaban, triamcinolone cream, acetaminophen, Magnesium, furosemide, metoprolol succinate, Narcan, rosuvastatin, and oxyCODONE.  Pharmacotherapy (Medications Ordered): Meds ordered this encounter  Medications  . oxyCODONE (OXY IR/ROXICODONE) 5 MG immediate release tablet    Sig: Take 1  tablet (5 mg total) by mouth every 6 (six) hours as needed for severe pain. Must last 30 days    Dispense:  120 tablet    Refill:  0    Chronic Pain: STOP Act (Not applicable) Fill 1 day early if closed on refill date. Do not fill until: 04/14/2019. To last until: 05/14/2019. Avoid benzodiazepines within 8 hours of opioids  . oxyCODONE (OXY IR/ROXICODONE) 5 MG immediate release tablet    Sig: Take 1 tablet (5 mg total) by mouth every 6 (six) hours as needed for severe pain. Must last 30 days    Dispense:  120 tablet    Refill:  0    Chronic Pain: STOP Act (Not applicable) Fill 1 day early if closed on refill date. Do not fill until: 05/14/2019. To last until: 06/13/2019. Avoid benzodiazepines within 8 hours of opioids  . oxyCODONE (OXY IR/ROXICODONE) 5 MG immediate release tablet    Sig: Take 1 tablet (5 mg total) by mouth every 6 (six) hours as needed for severe pain. Must last 30 days    Dispense:  120 tablet    Refill:  0    Chronic Pain: STOP Act (Not applicable) Fill 1 day early if closed on refill date. Do not fill until: 06/13/2019. To last until: 07/13/2019. Avoid benzodiazepines within 8  hours of opioids   Orders:  No orders of the defined types were placed in this encounter.  Follow-up plan:   Return in about 3 months (around 07/09/2019) for (VV), (MM).      Interventional therapies:  Considering:   NOTE: XARELTO ANTICOAGULATION (Stop: 3 days  Re-start: 6 hrs)  Diagnostic bilateral lumbar facet block #1  Possible bilateral lumbar facet RFA  Diagnostic bilateral intra-articular shoulder joint injection  Diagnostic bilateral suprascapular nerve block  Possible bilateral suprascapular nerve RFA  Diagnostic bilateral acromioclavicular joint injection  Diagnostic midline cervical epidural steroid injection  Diagnostic bilateral cervical facet block  Possible bilateral cervical facet RFA    Palliative PRN treatment(s):   Diagnostic left suprascapular NB #2     Recent Visits No visits were found meeting these conditions.  Showing recent visits within past 90 days and meeting all other requirements   Today's Visits Date Type Provider Dept  04/07/19 Telemedicine Milinda Pointer, MD [facility]  Showing today's visits and meeting all other requirements   Future Appointments No visits were found meeting these conditions.  Showing future appointments within next 90 days and meeting all other requirements   I discussed the assessment and treatment plan with the patient. The patient was provided an opportunity to ask questions and all were answered. The patient agreed with the plan and demonstrated an understanding of the instructions.  Patient advised to call back or seek an in-person evaluation if the symptoms or condition worsens.  Total duration of non-face-to-face encounter: 15 minutes.  Note by: Gaspar Cola, MD Date: 04/07/2019; Time: 3:43 PM  Note: This dictation was prepared with Dragon dictation. Any transcriptional errors that may result from this process are unintentional.  Disclaimer:  * Given the special circumstances of the  COVID-19 pandemic, the federal government has announced that the Office for Civil Rights (OCR) will exercise its enforcement discretion and will not impose penalties on physicians using telehealth in the event of noncompliance with regulatory requirements under the [place] (HIPAA) in connection with the good faith provision of telehealth during the XX123456 national public health emergency. ([place])

## 2019-04-07 ENCOUNTER — Ambulatory Visit: Payer: Medicare Other | Attending: Pain Medicine | Admitting: Pain Medicine

## 2019-04-07 ENCOUNTER — Other Ambulatory Visit: Payer: Self-pay

## 2019-04-07 DIAGNOSIS — G8929 Other chronic pain: Secondary | ICD-10-CM

## 2019-04-07 DIAGNOSIS — M25511 Pain in right shoulder: Secondary | ICD-10-CM

## 2019-04-07 DIAGNOSIS — M25512 Pain in left shoulder: Secondary | ICD-10-CM

## 2019-04-07 DIAGNOSIS — M545 Low back pain, unspecified: Secondary | ICD-10-CM

## 2019-04-07 DIAGNOSIS — M19012 Primary osteoarthritis, left shoulder: Secondary | ICD-10-CM

## 2019-04-07 DIAGNOSIS — M25559 Pain in unspecified hip: Secondary | ICD-10-CM

## 2019-04-07 DIAGNOSIS — G894 Chronic pain syndrome: Secondary | ICD-10-CM

## 2019-04-07 DIAGNOSIS — M47816 Spondylosis without myelopathy or radiculopathy, lumbar region: Secondary | ICD-10-CM

## 2019-04-07 DIAGNOSIS — M19011 Primary osteoarthritis, right shoulder: Secondary | ICD-10-CM

## 2019-04-07 MED ORDER — OXYCODONE HCL 5 MG PO TABS: 5.0000 mg | ORAL_TABLET | Freq: Four times a day (QID) | ORAL | 0 refills | Status: DC | PRN

## 2019-07-08 ENCOUNTER — Encounter: Payer: Self-pay | Admitting: Pain Medicine

## 2019-07-08 NOTE — Progress Notes (Signed)
Patient: Aaron Short  Service Category: E/M  Provider: Gaspar Cola, MD  DOB: 1945-11-05  DOS: 07/09/2019  Location: Office  MRN: [id number]  Setting: Ambulatory outpatient  Referring Provider: Neomia Dear, MD  Type: Established Patient  Specialty: Interventional Pain Management  PCP: Neomia Dear, MD  Location: Remote location  Delivery: TeleHealth     Virtual Encounter - Pain Management PROVIDER NOTE: Information contained herein reflects review and annotations entered in association with encounter. Interpretation of such information and data should be left to medically-trained personnel. Information provided to patient can be located elsewhere in the medical record under "Patient Instructions". Document created using STT-dictation technology, any transcriptional errors that may result from process are unintentional.    Contact & Pharmacy Preferred: [phone] Home: [phone] (home) Mobile: There is no such number on file (mobile). E-mail: [email or web address]  [facility] [id number]: [phone]: [phone]  Pre-screening  Mr. GKomatsuoffered "in-person" vs "virtual" encounter. He indicated preferring virtual for this encounter.   Reason COVID-19*  Social distancing based on CDC and AMA recommendations.   I contacted SSantanna Olenikon 07/09/2019 via telephone.      I clearly identified myself as FGaspar Cola MD. I verified that I was speaking with the correct person using two identifiers (Name: STaygen Acklin and date of birth: [birth year].  Consent I sought verbal advanced consent from SMickie Kayfor virtual visit interactions. I informed Mr. GStaraceof possible security and privacy concerns, risks, and limitations associated with providing "not-in-person" medical evaluation and management services. I also informed Mr. GMerriottof the  availability of "in-person" appointments. Finally, I informed him that there would be a charge for the virtual visit and that he could be  personally, fully or partially, financially responsible for it. Mr. GPalauexpressed understanding and agreed to proceed.   Historic Elements   Mr. SEstes Lehneris a 74y.o. year old, male patient evaluated today after his last contact with our practice on Visit date not found. Mr. GBaldi has a past medical history of Arthralgia of lower leg (04/02/2012), Arthralgia of upper arm (06/18/2009), [place], [place], FH: atrial fibrillation, and Hypertension. He also  has a past surgical history that includes bladder cancer surgery (2015); Joint replacement; Replacement total hip w/  resurfacing implants (Bilateral); Total knee arthroplasty (Bilateral); and Shoulder surgery (Bilateral). Mr. GMaiorinohas a current medication list which includes the following prescription(s): acetaminophen, carisoprodol, digoxin, furosemide, magnesium, metoprolol succinate, [START ON 07/13/2019] oxycodone, [START ON 08/12/2019] oxycodone, [START ON 09/11/2019] oxycodone, rivaroxaban, rosuvastatin, triamcinolone cream, and narcan. He  reports that he has been smoking cigarettes. He has been smoking about 0.50 packs per day. He has never used smokeless tobacco. He reports that he does not drink alcohol or use drugs. Mr. GGodeauxis allergic to diltiazem; penicillins; and tizanidine.   HPI  Today, he is being contacted for medication management.  The patient indicates doing well with the current medication regimen. No adverse reactions or side effects reported to the medications.  Today the patient requested an increase in his pain medication citing that he had a fall and he injured his left elbow and shoulder as well as fracturing some ribs.  He complains of pain in the area of the left thoracic upper back close to his shoulder blade.   However, when I looked at the fall, this occurred around 01/29/2019.  He had some  x-rays of the left elbow, the thoracic spine, the lumbar spine, and some x-rays of the chest area.  However, I cannot see any type of documentation of any rib fractures.  He indicates that there were some x-rays done at [facility] that are not available to [place], where he claims they saw that fracture.  I did see on the x-rays of the thoracic and lumbar spine but he seems to have a T12 wedge fracture, but they did not described as being either new or old.  He does indicate having some low back pain, but when I asked him about this low back pain he actually indicated that it was around his left shoulder blade, which would not qualify as low back pain.  In any case, I offered to do some intercostal nerve blocks, shoulder injections, and epidural steroid injections for these different pains that he described.  He seems to be reluctant to having any kind of injections done despite the fact that he was well informed when we first saw him that our specialty is interventional pain management and that is how we plan to manage his chronic pain and not only with medications.  In fact, when he asked for additional pain medicine today, I informed him that I would not be increasing his dose since this would probably make him more unstable on his feet and to start with during today's appointment he seemed to be slurring his words.  I do not think that adding more pain medicine to his regimen is the answer.  However I do think that treating him with some interventional therapies and perhaps trying to lower the amount of pain medication that he is currently on would probably be helpful.  He says that he has an upcoming appointment with Dr. Timoteo Gaul from [facility] and he wants to hold on any interventional therapies until he sees him.  Today I will try to send a copy of this note to Dr. Timoteo Gaul so that he is aware of what we have  offered this patient.  Pharmacotherapy Assessment  Analgesic: Oxycodone IR 5 mg, 1 tab PO q 6hrs (20 mg/dayof oxycodone) MME: 64m/day.  When this patient first came to me around 2016, he had an MME of 105 mg/day.   Monitoring:  PMP: PDMP reviewed during this encounter.       Pharmacotherapy: No side-effects or adverse reactions reported. Compliance: No problems identified. Effectiveness: Clinically acceptable. Plan: Refer to "POC".  UDS:  Summary  Date Value Ref Range Status  02/20/2019 Note  Final    Comment:    ==================================================================== [employer]ox[employer] Select 13 (MW) ==================================================================== Test                             Result       Flag       Units Drug Present   Oxycodone                      3557                    ng/mg creat   Oxymorphone                    >4274                   ng/mg creat   Noroxycodone                   >  4274                   ng/mg creat   Noroxymorphone                 2144                    ng/mg creat    Sources of oxycodone are scheduled prescription medications.    Oxymorphone, noroxycodone, and noroxymorphone are expected    metabolites of oxycodone. Oxymorphone is also available as a    scheduled prescription medication. ==================================================================== Test                      Result    Flag   Units      Ref Range   Creatinine              234              mg/dL      >=20 ==================================================================== Declared Medications:  Medication list was not provided. ==================================================================== For clinical consultation, please call [phone]. ====================================================================    Laboratory Chemistry Profile   Renal Lab Results  Component Value Date   BUN 11 02/10/2019   CREATININE 0.85  02/10/2019   BCR 13 02/10/2019   GFRAA 99 02/10/2019   GFRNONAA 85 02/10/2019    Hepatic Lab Results  Component Value Date   AST 15 02/10/2019   ALT 17 11/18/2015   ALBUMIN 4.0 02/10/2019   ALKPHOS 89 02/10/2019    Electrolytes Lab Results  Component Value Date   NA 141 02/10/2019   K 4.9 02/10/2019   CL 102 02/10/2019   CALCIUM 9.4 02/10/2019   MG 1.9 02/10/2019    Bone Lab Results  Component Value Date   25OHVITD1 32 02/10/2019   25OHVITD2 <1.0 02/10/2019   25OHVITD3 32 02/10/2019    Coagulation No results found for: INR, LABPROT, APTT, PLT, DDIMER, LABHEMA, VITAMINK1, AT3  Cardiovascular No results found for: BNP, CKTOTAL, CKMB, TROPONINI, HGB, HCT, LABVMA  Inflammation (CRP: Acute Phase) (ESR: Chronic Phase) Lab Results  Component Value Date   CRP 7 02/10/2019   ESRSEDRATE 21 02/10/2019      Note: Above Lab results reviewed.  Imaging  DG Lumbar Spine Complete W/Bend CLINICAL DATA:  Low back pain for 1 month radiating down LEFT leg, injured back lifting something heavy, lumbar spondylosis, lumbar facet syndrome  EXAM: LUMBAR SPINE - COMPLETE WITH BENDING VIEWS  COMPARISON:  11/18/2015  FINDINGS: Osseous demineralization.  Five non-rib-bearing lumbar vertebra.  Multilevel endplate spur formation throughout lumbar spine.  Minimal disc space narrowing L4-L5.  Vertebral body heights maintained without fracture or bone destruction.  Minimal retrolisthesis at L5-S1 approximately 3 mm which increases to 6 mm with extension and appears unchanged with flexion.  Remaining alignments normal it out abnormal motion with flexion or extension.  SI joints preserved.  BILATERAL hip prostheses.  IMPRESSION: Scattered degenerative disc disease changes of the lumbar spine.  Minimal retrolisthesis at L5-S1 which mildly increases with extension and is unchanged with flexion.  Electronically Signed   By: Lavonia Dana M.D.   On: 11/08/2017 11:02  Assessment   Diagnoses of Chronic pain syndrome, Osteoarthritis of shoulder (Bilateral) (L>R), Lumbar spondylosis, T12 compression fracture, sequela, and Chronic shoulder pain (Left) were pertinent to this visit.  Plan of Care  Problem-specific:  No problem-specific Assessment & Plan notes found for this encounter.  I am having [name] start on oxyCODONE  and oxyCODONE. I am also having him maintain his carisoprodol, digoxin, rivaroxaban, triamcinolone cream, acetaminophen, Magnesium, furosemide, metoprolol succinate, Narcan, rosuvastatin, and oxyCODONE.  Pharmacotherapy (Medications Ordered): Meds ordered this encounter  Medications  . oxyCODONE (OXY IR/ROXICODONE) 5 MG immediate release tablet    Sig: Take 1 tablet (5 mg total) by mouth every 6 (six) hours as needed for severe pain. Must last 30 days    Dispense:  120 tablet    Refill:  0    Chronic Pain: STOP Act (Not applicable) Fill 1 day early if closed on refill date. Do not fill until: 07/13/2019. To last until: 08/12/2019. Avoid benzodiazepines within 8 hours of opioids  . oxyCODONE (OXY IR/ROXICODONE) 5 MG immediate release tablet    Sig: Take 1 tablet (5 mg total) by mouth every 6 (six) hours as needed for severe pain. Must last 30 days    Dispense:  120 tablet    Refill:  0    Chronic Pain: STOP Act (Not applicable) Fill 1 day early if closed on refill date. Do not fill until: 08/12/2019. To last until: 09/11/2019. Avoid benzodiazepines within 8 hours of opioids  . oxyCODONE (OXY IR/ROXICODONE) 5 MG immediate release tablet    Sig: Take 1 tablet (5 mg total) by mouth every 6 (six) hours as needed for severe pain. Must last 30 days    Dispense:  120 tablet    Refill:  0    Chronic Pain: STOP Act (Not applicable) Fill 1 day early if closed on refill date. Do not fill until: 09/11/2019. To last until: 10/11/2019. Avoid benzodiazepines within 8 hours of opioids   Orders:  No orders of the defined types were placed in this  encounter.  Follow-up plan:   Return in about 13 weeks (around 10/08/2019) for (VV), (MM).      Interventional therapies:  Considering:   NOTE: XARELTO ANTICOAGULATION (Stop: 3 days  Re-start: 6 hrs)  Diagnostic left T12-L1 LESI  Diagnostic bilateral lumbar facet block #1  Possible bilateral lumbar facet RFA  Diagnostic/therapeutic left intercostal NB   Therapeutic left intercostal RFA  Diagnostic bilateral IA shoulder joint injection  Diagnostic right suprascapular NB  Possible left suprascapular nerve RFA  Diagnostic bilateral acromioclavicular joint injection  Diagnostic midline CESI  Diagnostic bilateral cervical facet block  Possible bilateral cervical facet RFA    Palliative PRN treatment(s):   Diagnostic left suprascapular NB #2     Recent Visits No visits were found meeting these conditions.  Showing recent visits within past 90 days and meeting all other requirements   Today's Visits Date Type Provider Dept  07/09/19 Telemedicine Milinda Pointer, MD [facility]  Showing today's visits and meeting all other requirements   Future Appointments No visits were found meeting these conditions.  Showing future appointments within next 90 days and meeting all other requirements   I discussed the assessment and treatment plan with the patient. The patient was provided an opportunity to ask questions and all were answered. The patient agreed with the plan and demonstrated an understanding of the instructions.  Patient advised to call back or seek an in-person evaluation if the symptoms or condition worsens.  Duration of encounter: 18 minutes.  Note by: Gaspar Cola, MD Date: 07/09/2019; Time: 10:14 AM

## 2019-07-09 ENCOUNTER — Other Ambulatory Visit: Payer: Self-pay

## 2019-07-09 ENCOUNTER — Ambulatory Visit: Payer: Medicare Other | Attending: Pain Medicine | Admitting: Pain Medicine

## 2019-07-09 DIAGNOSIS — G894 Chronic pain syndrome: Secondary | ICD-10-CM

## 2019-07-09 DIAGNOSIS — M47816 Spondylosis without myelopathy or radiculopathy, lumbar region: Secondary | ICD-10-CM

## 2019-07-09 DIAGNOSIS — S22080S Wedge compression fracture of T11-T12 vertebra, sequela: Secondary | ICD-10-CM

## 2019-07-09 DIAGNOSIS — M19012 Primary osteoarthritis, left shoulder: Secondary | ICD-10-CM

## 2019-07-09 DIAGNOSIS — M19011 Primary osteoarthritis, right shoulder: Secondary | ICD-10-CM | POA: Diagnosis not present

## 2019-07-09 DIAGNOSIS — G8929 Other chronic pain: Secondary | ICD-10-CM

## 2019-07-09 MED ORDER — OXYCODONE HCL 5 MG PO TABS: 5.0000 mg | ORAL_TABLET | Freq: Four times a day (QID) | ORAL | 0 refills | Status: DC | PRN

## 2019-07-10 DIAGNOSIS — Z9181 History of falling: Secondary | ICD-10-CM | POA: Insufficient documentation

## 2019-10-06 NOTE — Progress Notes (Signed)
Patient: Aaron Short  Service Category: E/M  Provider: Gaspar Cola, MD  DOB: 11-24-1945  DOS: 10/08/2019  Location: Office  MRN: [id number]  Setting: Ambulatory outpatient  Referring Provider: Neomia Dear, MD  Type: Established Patient  Specialty: Interventional Pain Management  PCP: Aaron Dear, MD  Location: Remote location  Delivery: TeleHealth     Virtual Short - Pain Management PROVIDER NOTE: Information contained herein reflects review Aaron annotations entered in association with Short. Interpretation of such information Aaron data should be left to medically-trained personnel. Information provided to patient can be located elsewhere in the medical record under "Patient Instructions". Document created using STT-dictation technology, any transcriptional errors that may result from process are unintentional.    Contact & Pharmacy Preferred: [phone] Home: [phone] (home) Mobile: [phone] (mobile) E-mail: [email or web address]  [id number] [place] [id number]: [phone]: [phone]  Pre-screening  Aaron Short. He indicated preferring virtual for this Short.   Reason COVID-19*  Social distancing based on CDC Aaron AMA recommendations.   I contacted SDanthony Kendrixon 10/08/2019 via telephone.      I clearly identified myself as FGaspar Cola MD. I verified that I was speaking with the correct person using two identifiers (Name: SSanford Short Aaron date of birth: [id number].  Consent I sought verbal advanced consent from SMickie Kayfor virtual visit interactions. I informed Aaron Short, Aaron Short, Aaron limitations associated with providing "not-in-person" medical evaluation Aaron management services. I also informed Aaron Short of "in-person"  appointments. Finally, I informed him that there would be a charge for the virtual visit Aaron that he could be  personally, fully or partially, financially responsible for it. [name] understanding Aaron agreed to proceed.   Historic Elements   Mr. SMarques Ericsonis a 74y.o. year old, male patient evaluated today after his last contact with our practice on Visit date not found. Mr. GPhaneuf has a past medical history of Arthralgia of lower leg (04/02/2012), Arthralgia of upper arm (06/18/2009), Cancer ([place], Diabetes mellitus without complication ([place], FH: atrial fibrillation, Aaron Hypertension. He also  has a past surgical history that includes bladder cancer surgery (2015); Joint replacement; Replacement total hip w/  resurfacing implants (Bilateral); Total knee arthroplasty (Bilateral); Aaron Shoulder surgery (Bilateral). Mr. [name] a current medication list which includes the following prescription(s): acetaminophen, carisoprodol, digoxin, furosemide, magnesium, metoprolol succinate, [START ON 10/11/2019] oxycodone, [START ON 11/10/2019] oxycodone, [START ON 12/10/2019] oxycodone, rivaroxaban, rosuvastatin, triamcinolone cream, Aaron narcan. He  reports that he has been smoking cigarettes. He has been smoking about 0.50 packs per day. He has never used smokeless tobacco. He reports that he does not drink alcohol or use drugs. Mr. [name] allergic to diltiazem; penicillins; Aaron tizanidine.   HPI  Today, he is being contacted for medication management.  The patient was initially called today at 9 AM, but he did not answer the call. The patient indicates doing well with the current medication regimen. No adverse reactions or side effects reported to the medications.  Apparently the patient had provided uKoreapreviously with his wife's telephone number as his primary contact.  When we called this morning she did not answer the call.  He called back Aaron he clarified the problem  Aaron this has been corrected in the electronic medical record so as to reflect his true primary contact  to be his phone number.  Pharmacotherapy Assessment  Analgesic: Oxycodone IR 5 mg, 1 tab PO q 6hrs (20 mg/dayof oxycodone) MME: 13m/day.  When this patient first came to me around 2016, he had an MME of 105 mg/day.   Monitoring: Aaron Short PMP: PDMP reviewed during this Short.       Pharmacotherapy: No side-effects or adverse reactions reported. Compliance: No problems identified. Effectiveness: Clinically acceptable. Plan: Refer to "POC".  UDS:  Summary  Date Value Ref Range Status  02/20/2019 Note  Final    Comment:    ==================================================================== [employer] Select 13 (MW) ==================================================================== Test                             Result       Flag       Units Drug Present   Oxycodone                      3557                    ng/mg creat   Oxymorphone                    >4274                   ng/mg creat   Noroxycodone                   >4274                   ng/mg creat   Noroxymorphone                 2144                    ng/mg creat    Sources of oxycodone are scheduled prescription medications.    Oxymorphone, noroxycodone, Aaron noroxymorphone are expected    metabolites of oxycodone. Oxymorphone is also available as a    scheduled prescription medication. ==================================================================== Test                      Result    Flag   Units      Ref Range   Creatinine              234              mg/dL      >=20 ==================================================================== Declared Medications:  Medication list was not provided. ==================================================================== For clinical consultation, please call [phone] ====================================================================    Laboratory  Chemistry Profile   Renal Lab Results  Component Value Date   BUN 11 02/10/2019   CREATININE 0.85 02/10/2019   BCR 13 02/10/2019   GFRAA 99 02/10/2019   GFRNONAA 85 02/10/2019     Hepatic Lab Results  Component Value Date   AST 15 02/10/2019   ALT 17 11/18/2015   ALBUMIN 4.0 02/10/2019   ALKPHOS 89 02/10/2019     Electrolytes Lab Results  Component Value Date   NA 141 02/10/2019   K 4.9 02/10/2019   CL 102 02/10/2019   CALCIUM 9.4 02/10/2019   MG 1.9 02/10/2019     Bone Lab Results  Component Value Date   25OHVITD1 32 02/10/2019   25OHVITD2 <1.0 02/10/2019   25OHVITD3 32 02/10/2019     Inflammation (CRP: Acute Phase) (ESR: Chronic Phase) Lab Results  Component  Value Date   CRP 7 02/10/2019   ESRSEDRATE 21 02/10/2019       Note: Above Lab results reviewed.  Imaging  DG Lumbar Spine Complete W/Bend CLINICAL DATA:  Low back pain for 1 month radiating down LEFT leg, injured back lifting something heavy, lumbar spondylosis, lumbar facet syndrome  EXAM: LUMBAR SPINE - COMPLETE WITH BENDING VIEWS  COMPARISON:  11/18/2015  FINDINGS: Osseous demineralization.  Five non-rib-bearing lumbar vertebra.  Multilevel endplate spur formation throughout lumbar spine.  Minimal disc space narrowing L4-L5.  Vertebral body heights maintained without fracture or bone destruction.  Minimal retrolisthesis at L5-S1 approximately 3 mm which increases to 6 mm with extension Aaron appears unchanged with flexion.  Remaining alignments normal it out abnormal motion with flexion or extension.  SI joints preserved.  BILATERAL hip prostheses.  IMPRESSION: Scattered degenerative disc disease changes of the lumbar spine.  Minimal retrolisthesis at L5-S1 which mildly increases with extension Aaron is unchanged with flexion.  Electronically Signed   By: Lavonia Dana M.D.   On: 11/08/2017 11:02  Assessment  The primary Short diagnosis was Chronic pain syndrome.  Diagnoses of T12 compression fracture, sequela, Chronic low back pain (Third area of Pain) (Bilateral) (R>L), Chronic hip pain (Secondary area of Pain) (Bilateral) (R>L), Chronic shoulder pain (Left), Lumbar spondylosis, Osteoarthritis of shoulder (Bilateral) (L>R), Long term current use of opiate analgesic, Long-term use of high-risk medication, Aaron Opiate use (30 MME/Day) were also pertinent to this visit.  Plan of Care  Problem-specific:  No problem-specific Assessment & Plan notes found for this Short.  Mr. Jonathen Rathman has a current medication list which includes the following long-term medication(s): digoxin, furosemide, metoprolol succinate, [START ON 10/11/2019] oxycodone, [START ON 11/10/2019] oxycodone, [START ON 12/10/2019] oxycodone, rivaroxaban, Aaron narcan.  Pharmacotherapy (Medications Ordered): Meds ordered this Short  Medications  . oxyCODONE (OXY IR/ROXICODONE) 5 MG immediate release tablet    Sig: Take 1 tablet (5 mg total) by mouth every 6 (six) hours as needed for severe pain. Must last 30 days    Dispense:  120 tablet    Refill:  0    Chronic Pain: STOP Act (Not applicable) Fill 1 day early if closed on refill date. Do not fill until: 10/11/2019. To last until: 11/10/2019. Avoid benzodiazepines within 8 hours of opioids  . oxyCODONE (OXY IR/ROXICODONE) 5 MG immediate release tablet    Sig: Take 1 tablet (5 mg total) by mouth every 6 (six) hours as needed for severe pain. Must last 30 days    Dispense:  120 tablet    Refill:  0    Chronic Pain: STOP Act (Not applicable) Fill 1 day early if closed on refill date. Do not fill until: 11/10/2019. To last until: 12/10/2019. Avoid benzodiazepines within 8 hours of opioids  . oxyCODONE (OXY IR/ROXICODONE) 5 MG immediate release tablet    Sig: Take 1 tablet (5 mg total) by mouth every 6 (six) hours as needed for severe pain. Must last 30 days    Dispense:  120 tablet    Refill:  0    Chronic Pain: STOP Act (Not  applicable) Fill 1 day early if closed on refill date. Do not fill until: 12/10/2019. To last until: 01/09/2020. Avoid benzodiazepines within 8 hours of opioids   Orders:  No orders of the defined types were placed in this Short.  Follow-up plan:   Return in about 13 weeks (around 01/07/2020) for (F2F), (MM).      Interventional therapies:  Considering:  NOTE: XARELTO ANTICOAGULATION (Stop: 3 days  Re-start: 6 hrs)  Diagnostic left T12-L1 LESI  Diagnostic bilateral lumbar facet block #1  Possible bilateral lumbar facet RFA  Diagnostic/therapeutic left intercostal NB   Therapeutic left intercostal RFA  Diagnostic bilateral IA shoulder joint injection  Diagnostic right suprascapular NB  Possible left suprascapular nerve RFA  Diagnostic bilateral acromioclavicular joint injection  Diagnostic midline CESI  Diagnostic bilateral cervical facet block  Possible bilateral cervical facet RFA    Palliative PRN treatment(s):   Diagnostic left suprascapular NB #2      Recent Visits No visits were found meeting these conditions.  Showing recent visits within past 90 days Aaron meeting all other requirements   Today's Visits Date Type Provider Dept  10/08/19 Telemedicine Milinda Pointer, MD [facility]  Showing today's visits Aaron meeting all other requirements   Future Appointments No visits were found meeting these conditions.  Showing future appointments within next 90 days Aaron meeting all other requirements   I discussed the assessment Aaron treatment plan with the patient. The patient was provided an opportunity to ask questions Aaron all were answered. The patient agreed with the plan Aaron demonstrated an understanding of the instructions.  Patient advised to call back or seek an in-person evaluation if the symptoms or condition worsens.  Duration of Short: 11 minutes.  Note by: Aaron Cola, MD Date: 10/08/2019; Time: 12:41 PM

## 2019-10-07 ENCOUNTER — Encounter: Payer: Self-pay | Admitting: Pain Medicine

## 2019-10-07 ENCOUNTER — Telehealth: Payer: Self-pay | Admitting: *Deleted

## 2019-10-08 ENCOUNTER — Other Ambulatory Visit: Payer: Self-pay

## 2019-10-08 ENCOUNTER — Ambulatory Visit: Payer: Medicare Other | Attending: Pain Medicine | Admitting: Pain Medicine

## 2019-10-08 DIAGNOSIS — G894 Chronic pain syndrome: Secondary | ICD-10-CM | POA: Diagnosis not present

## 2019-10-08 DIAGNOSIS — M25559 Pain in unspecified hip: Secondary | ICD-10-CM

## 2019-10-08 DIAGNOSIS — M25512 Pain in left shoulder: Secondary | ICD-10-CM

## 2019-10-08 DIAGNOSIS — S22080S Wedge compression fracture of T11-T12 vertebra, sequela: Secondary | ICD-10-CM | POA: Diagnosis not present

## 2019-10-08 DIAGNOSIS — G8929 Other chronic pain: Secondary | ICD-10-CM

## 2019-10-08 DIAGNOSIS — M47816 Spondylosis without myelopathy or radiculopathy, lumbar region: Secondary | ICD-10-CM

## 2019-10-08 DIAGNOSIS — Z79891 Long term (current) use of opiate analgesic: Secondary | ICD-10-CM

## 2019-10-08 DIAGNOSIS — M19012 Primary osteoarthritis, left shoulder: Secondary | ICD-10-CM

## 2019-10-08 DIAGNOSIS — M19011 Primary osteoarthritis, right shoulder: Secondary | ICD-10-CM

## 2019-10-08 DIAGNOSIS — M545 Low back pain, unspecified: Secondary | ICD-10-CM

## 2019-10-08 DIAGNOSIS — F1190 Opioid use, unspecified, uncomplicated: Secondary | ICD-10-CM

## 2019-10-08 DIAGNOSIS — Z79899 Other long term (current) drug therapy: Secondary | ICD-10-CM

## 2019-10-08 MED ORDER — OXYCODONE HCL 5 MG PO TABS: 5.0000 mg | ORAL_TABLET | Freq: Four times a day (QID) | ORAL | 0 refills | Status: DC | PRN

## 2019-10-30 ENCOUNTER — Encounter: Payer: Self-pay | Admitting: Pain Medicine

## 2019-11-14 DIAGNOSIS — F321 Major depressive disorder, single episode, moderate: Secondary | ICD-10-CM | POA: Insufficient documentation

## 2019-12-09 DIAGNOSIS — F515 Nightmare disorder: Secondary | ICD-10-CM | POA: Insufficient documentation

## 2020-01-07 ENCOUNTER — Ambulatory Visit: Payer: Medicare Other | Attending: Pain Medicine | Admitting: Pain Medicine

## 2020-01-07 ENCOUNTER — Telehealth: Payer: Self-pay | Admitting: *Deleted

## 2020-01-07 ENCOUNTER — Encounter: Payer: Self-pay | Admitting: Pain Medicine

## 2020-01-07 ENCOUNTER — Other Ambulatory Visit: Payer: Self-pay

## 2020-01-07 ENCOUNTER — Telehealth: Payer: Self-pay | Admitting: Pain Medicine

## 2020-01-07 ENCOUNTER — Other Ambulatory Visit: Payer: Self-pay | Admitting: Pain Medicine

## 2020-01-07 VITALS — BP 112/68 | HR 62 | Temp 97.2°F | Resp 16 | Ht 68.0 in | Wt 197.0 lb

## 2020-01-07 DIAGNOSIS — M47816 Spondylosis without myelopathy or radiculopathy, lumbar region: Secondary | ICD-10-CM | POA: Diagnosis not present

## 2020-01-07 DIAGNOSIS — G894 Chronic pain syndrome: Secondary | ICD-10-CM

## 2020-01-07 DIAGNOSIS — M25511 Pain in right shoulder: Secondary | ICD-10-CM | POA: Diagnosis not present

## 2020-01-07 DIAGNOSIS — M19011 Primary osteoarthritis, right shoulder: Secondary | ICD-10-CM | POA: Diagnosis present

## 2020-01-07 DIAGNOSIS — M25559 Pain in unspecified hip: Secondary | ICD-10-CM | POA: Insufficient documentation

## 2020-01-07 DIAGNOSIS — G8929 Other chronic pain: Secondary | ICD-10-CM | POA: Insufficient documentation

## 2020-01-07 DIAGNOSIS — M545 Low back pain: Secondary | ICD-10-CM | POA: Insufficient documentation

## 2020-01-07 DIAGNOSIS — M25512 Pain in left shoulder: Secondary | ICD-10-CM | POA: Insufficient documentation

## 2020-01-07 DIAGNOSIS — M19012 Primary osteoarthritis, left shoulder: Secondary | ICD-10-CM | POA: Diagnosis present

## 2020-01-07 MED ORDER — OXYCODONE HCL 5 MG PO TABS: 5.0000 mg | ORAL_TABLET | Freq: Four times a day (QID) | ORAL | 0 refills | Status: DC | PRN

## 2020-01-07 NOTE — Patient Instructions (Addendum)
Pain Management Discharge Instructions  General Discharge Instructions :  If you need to reach your doctor call: Monday-Friday 8:00 am - 4:00 pm at [phone] or toll free [phone].  After clinic hours [phone] to have operator reach doctor.  Bring all of your medication bottles to all your appointments in the pain clinic.  To cancel or reschedule your appointment with Pain Management please remember to call 24 hours in advance to avoid a fee.  Refer to the educational materials which you have been given on: General Risks, I had my Procedure. Discharge Instructions, Post Sedation.  Post Procedure Instructions:  The drugs you were given will stay in your system until tomorrow, so for the next 24 hours you should not drive, make any legal decisions or drink any alcoholic beverages.  You may eat anything you prefer, but it is better to start with liquids then soups and crackers, and gradually work up to solid foods.  Please notify your doctor immediately if you have any unusual bleeding, trouble breathing or pain that is not related to your normal pain.  Depending on the type of procedure that was done, some parts of your body may feel week and/or numb.  This usually clears up by tonight or the next day.  Walk with the use of an assistive device or accompanied by an adult for the 24 hours.  You may use ice on the affected area for the first 24 hours.  Put ice in a Ziploc bag and cover with a towel and place against area 15 minutes on 15 minutes off.  You may switch to heat after 24 hours.  ____________________________________________________________________________________________  Drug Holidays (Slow)  What is a "Drug Holiday"? Drug Holiday: is the name given to the period of time during which a patient stops taking a medication(s) for the purpose of eliminating tolerance to the drug.  Benefits . Improved effectiveness of opioids. . Decreased opioid dose needed to achieve  benefits. . Improved pain with lesser dose.  What is tolerance? Tolerance: is the progressive decreased in effectiveness of a drug due to its repetitive use. With repetitive use, the body gets use to the medication and as a consequence, it loses its effectiveness. This is a common problem seen with opioid pain medications. As a result, a larger dose of the drug is needed to achieve the same effect that used to be obtained with a smaller dose.  How long should a "Drug Holiday" last? You should stay off of the pain medicine for at least 14 consecutive days. (2 weeks)  Should I stop the medicine "cold [place]"? No. You should always coordinate with your Pain Specialist so that he/she can provide you with the correct medication dose to make the transition as smoothly as possible.  How do I stop the medicine? Slowly. You will be instructed to decrease the daily amount of pills that you take by one (1) pill every seven (7) days. This is called a "slow downward taper" of your dose. For example: if you normally take four (4) pills per day, you will be asked to drop this dose to three (3) pills per day for seven (7) days, then to two (2) pills per day for seven (7) days, then to one (1) per day for seven (7) days, and at the end of those last seven (7) days, this is when the "Drug Holiday" would start.   Will I have withdrawals? By doing a "slow downward taper" like this one, it is unlikely that  you will experience any significant withdrawal symptoms. Typically, what triggers withdrawals is the sudden stop of a high dose opioid therapy. Withdrawals can usually be avoided by slowly decreasing the dose over a prolonged period of time. If you do not follow these instructions and decide to stop your medication abruptly, withdrawals may be possible.  What are withdrawals? Withdrawals: refers to the wide range of symptoms that occur after stopping or dramatically reducing opiate drugs after heavy and prolonged  use. Withdrawal symptoms do not occur to patients that use low dose opioids, or those who take the medication sporadically. Contrary to benzodiazepine (example: Valium, Xanax, etc.) or alcohol withdrawals ("Delirium Tremens"), opioid withdrawals are not lethal. Withdrawals are the physical manifestation of the body getting rid of the excess receptors.  Expected Symptoms Early symptoms of withdrawal may include: . Agitation . Anxiety . Muscle aches . Increased tearing . Insomnia . Runny nose . Sweating . Yawning  Late symptoms of withdrawal may include: . Abdominal cramping . Diarrhea . Dilated pupils . Goose bumps . Nausea . Vomiting  Will I experience withdrawals? Due to the slow nature of the taper, it is very unlikely that you will experience any.  What is a slow taper? Taper: refers to the gradual decrease in dose.  (Last update: 12/17/2019) ____________________________________________________________________________________________    ____________________________________________________________________________________________  Medication Rules  Purpose: To inform patients, and their family members, of our rules and regulations.  Applies to: All patients receiving prescriptions (written or electronic).  Pharmacy of record: Pharmacy where electronic prescriptions will be sent. If written prescriptions are taken to a different pharmacy, please inform the nursing staff. The pharmacy listed in the electronic medical record should be the one where you would like electronic prescriptions to be sent.  Electronic prescriptions: In compliance with the [place] (STOP) Act of 2017 (Session Lanny Cramp [phone]), effective May 29, 2018, all controlled substances must be electronically prescribed. Calling prescriptions to the pharmacy will cease to exist.  Prescription refills: Only during scheduled appointments. Applies to all  prescriptions.  NOTE: The following applies primarily to controlled substances (Opioid* Pain Medications).   Type of encounter (visit): For patients receiving controlled substances, face-to-face visits are required. (Not an option or up to the patient.)  Patient's responsibilities: 1. Pain Pills: Bring all pain pills to every appointment (except for procedure appointments). 2. Pill Bottles: Bring pills in original pharmacy bottle. Always bring the newest bottle. Bring bottle, even if empty. 3. Medication refills: You are responsible for knowing and keeping track of what medications you take and those you need refilled. The day before your appointment: write a list of all prescriptions that need to be refilled. The day of the appointment: give the list to the admitting nurse. Prescriptions will be written only during appointments. No prescriptions will be written on procedure days. If you forget a medication: it will not be "Called in", "Faxed", or "electronically sent". You will need to get another appointment to get these prescribed. No early refills. Do not call asking to have your prescription filled early. 4. Prescription Accuracy: You are responsible for carefully inspecting your prescriptions before leaving our office. Have the discharge nurse carefully go over each prescription with you, before taking them home. Make sure that your name is accurately spelled, that your address is correct. Check the name and dose of your medication to make sure it is accurate. Check the number of pills, and the written instructions to make sure they are clear and accurate.  Make sure that you are given enough medication to last until your next medication refill appointment. 5. Taking Medication: Take medication as prescribed. When it comes to controlled substances, taking less pills or less frequently than prescribed is permitted and encouraged. Never take more pills than instructed. Never take medication more  frequently than prescribed.  6. Inform other Doctors: Always inform, all of your healthcare providers, of all the medications you take. 7. Pain Medication from other Providers: You are not allowed to accept any additional pain medication from any other Doctor or Healthcare provider. There are two exceptions to this rule. (see below) In the event that you require additional pain medication, you are responsible for notifying us, as stated below. 8. Medication Agreement: You are responsible for carefully reading and following our Medication Agreement. This must be signed before receiving any prescriptions from our practice. Safely store a copy of your signed Agreement. Violations to the Agreement will result in no further prescriptions. (Additional copies of our Medication Agreement are available upon request.) 9. Laws, Rules, & Regulations: All patients are expected to follow all Federal and [employer], [employer], Rules, [employer] [facility]. Ignorance of the Laws does not constitute a valid excuse.  10. Illegal drugs and Controlled Substances: The use of illegal substances (including, but not limited to marijuana and its derivatives) and/or the illegal use of any controlled substances is strictly prohibited. Violation of this rule may result in the immediate and permanent discontinuation of any and all prescriptions being written by our practice. The use of any illegal substances is prohibited. 11. Adopted CDC guidelines & recommendations: Target dosing levels will be at or below 60 MME/day. Use of benzodiazepines** is not recommended.  Exceptions: There are only two exceptions to the rule of not receiving pain medications from other Healthcare Providers. 1. Exception #1 (Emergencies): In the event of an emergency (i.e.: accident requiring emergency care), you are allowed to receive additional pain medication. However, you are responsible for: As soon as you are able, call our office [phone], at any time of  the day or night, and leave a message stating your name, the date and nature of the emergency, and the name and dose of the medication prescribed. In the event that your call is answered by a member of our staff, make sure to document and save the date, time, and the name of the person that took your information.  2. Exception #2 (Planned Surgery): In the event that you are scheduled by another doctor or dentist to have any type of surgery or procedure, you are allowed (for a period no longer than 30 days), to receive additional pain medication, for the acute post-op pain. However, in this case, you are responsible for picking up a copy of our "Post-op Pain Management for Surgeons" handout, and giving it to your surgeon or dentist. This document is available at our office, and does not require an appointment to obtain it. Simply go to our office during business hours (Monday-Thursday from 8:00 AM to 4:00 PM) (Friday 8:00 AM to 12:00 Noon) or if you have a scheduled appointment with [place], prior to your surgery, and ask for it by name. In addition, you will need to provide [place] with your name, name of your surgeon, type of surgery, and date of procedure or surgery.  *Opioid medications include: morphine, codeine, oxycodone, oxymorphone, hydrocodone, hydromorphone, meperidine, tramadol, tapentadol, buprenorphine, fentanyl, methadone. **Benzodiazepine medications include: diazepam (Valium), alprazolam (Xanax), clonazepam (Klonopine), lorazepam (Ativan), clorazepate (Tranxene), chlordiazepoxide (Librium), estazolam (  Prosom), oxazepam (Serax), temazepam (Restoril), triazolam (Halcion) (Last updated: 07/26/2017) ____________________________________________________________________________________________   ____________________________________________________________________________________________  Medication Recommendations and Reminders  Applies to: All patients receiving prescriptions (written and/or  electronic).  Medication Rules & Regulations: These rules and regulations exist for your safety and that of others. They are not flexible and neither are we. Dismissing or ignoring them will be considered "non-compliance" with medication therapy, resulting in complete and irreversible termination of such therapy. (See document titled "Medication Rules" for more details.) In all conscience, because of safety reasons, we cannot continue providing a therapy where the patient does not follow instructions.  Pharmacy of record:   Definition: This is the pharmacy where your electronic prescriptions will be sent.   We do not endorse any particular pharmacy, however, we have experienced problems with [facility] not securing enough medication supply for the community.  We do not restrict you in your choice of pharmacy. However, once we write for your prescriptions, we will NOT be re-sending more prescriptions to fix restricted supply problems created by your pharmacy, or your insurance.   The pharmacy listed in the electronic medical record should be the one where you want electronic prescriptions to be sent.  If you choose to change pharmacy, simply notify our nursing staff.  Recommendations:  Keep all of your pain medications in a safe place, under lock and key, even if you live alone. We will NOT replace lost, stolen, or damaged medication.  After you fill your prescription, take 1 week's worth of pills and put them away in a safe place. You should keep a separate, properly labeled bottle for this purpose. The remainder should be kept in the original bottle. Use this as your primary supply, until it runs out. Once it's gone, then you know that you have 1 week's worth of medicine, and it is time to come in for a prescription refill. If you do this correctly, it is unlikely that you will ever run out of medicine.  To make sure that the above recommendation works, it is very important that you make sure  your medication refill appointments are scheduled at least 1 week before you run out of medicine. To do this in an effective manner, make sure that you do not leave the office without scheduling your next medication management appointment. Always ask the nursing staff to show you in your prescription , when your medication will be running out. Then arrange for the receptionist to get you a return appointment, at least 7 days before you run out of medicine. Do not wait until you have 1 or 2 pills left, to come in. This is very poor planning and does not take into consideration that we may need to cancel appointments due to bad weather, sickness, or emergencies affecting our staff.  DO NOT ACCEPT A "Partial Fill": If for any reason your pharmacy does not have enough pills/tablets to completely fill or refill your prescription, do not allow for a "partial fill". The law allows the pharmacy to complete that prescription within 72 hours, without requiring a new prescription. If they do not fill the rest of your prescription within those 72 hours, you will need a separate prescription to fill the remaining amount, which we will NOT provide. If the reason for the partial fill is your insurance, you will need to talk to the pharmacist about payment alternatives for the remaining tablets, but again, DO NOT ACCEPT A PARTIAL FILL, unless you can trust your pharmacist to obtain the remainder of  the pills within 72 hours.  Prescription refills and/or changes in medication(s):   Prescription refills, and/or changes in dose or medication, will be conducted only during scheduled medication management appointments. (Applies to both, written and electronic prescriptions.)  No refills on procedure days. No medication will be changed or started on procedure days. No changes, adjustments, and/or refills will be conducted on a procedure day. Doing so will interfere with the diagnostic portion of the procedure.  No phone  refills. No medications will be "called into the pharmacy".  No Fax refills.  No weekend refills.  No Holliday refills.  No after hours refills.  Remember:  Business hours are:  Monday to Thursday 8:00 AM to 4:00 PM Provider's Schedule: Milinda Pointer, MD - Appointments are:  Medication management: Monday and Wednesday 8:00 AM to 4:00 PM Procedure day: Tuesday and Thursday 7:30 AM to 4:00 PM Gillis Santa, MD - Appointments are:  Medication management: Tuesday and Thursday 8:00 AM to 4:00 PM Procedure day: Monday and Wednesday 7:30 AM to 4:00 PM (Last update: 12/17/2019) ____________________________________________________________________________________________   ____________________________________________________________________________________________  CBD (cannabidiol) WARNING  Applicable to: All individuals currently taking or considering taking CBD (cannabidiol) and, more important, all patients taking opioid analgesic controlled substances (pain medication). (Example: oxycodone; oxymorphone; hydrocodone; hydromorphone; morphine; methadone; tramadol; tapentadol; fentanyl; buprenorphine; butorphanol; dextromethorphan; meperidine; codeine; etc.)  Legal status: CBD remains a Schedule I drug prohibited for any use. CBD is illegal with one exception. In the [place], CBD has a limited Transport planner (FDA) approval for the treatment of two specific types of epilepsy disorders. Only one CBD product has been approved by the FDA for this purpose: "Epidiolex". FDA is aware that some companies are marketing products containing cannabis and cannabis-derived compounds in ways that violate the [employer], Drug and Cosmetic Act [facility] Act) and that may put the health and safety of consumers at risk. The FDA, a Federal agency, has not enforced the CBD status since 2018.   Legality: Some manufacturers ship CBD products nationally, which is illegal. Often such products are  sold online and are therefore available throughout the country. CBD is openly sold in head shops and health food stores in some states where such sales have not been explicitly legalized. Selling unapproved products with unsubstantiated therapeutic claims is not only a violation of the law, but also can put patients at risk, as these products have not been proven to be safe or effective. Federal illegality makes it difficult to conduct research on CBD.  Reference: "FDA Regulation of Cannabis and Cannabis-Derived Products, Including Cannabidiol (CBD)" - [employer]  Warning: CBD is not FDA approved and has not undergo the same manufacturing controls as prescription drugs.  This means that the purity and safety of available CBD may be questionable. Most of the time, despite manufacturer's claims, it is contaminated with THC (delta-9-tetrahydrocannabinol - the chemical in marijuana responsible for the "HIGH").  When this is the case, the [facility] contaminant will trigger a positive urine drug screen (UDS) test for Marijuana (carboxy-THC). Because a positive UDS for any illicit substance is a violation of our medication agreement, your opioid analgesics (pain medicine) may be permanently discontinued.  MORE ABOUT CBD  General Information: CBD  is a derivative of the Marijuana (cannabis sativa) plant discovered in 88. It is one of the 113 identified substances found in Marijuana. It accounts for up to 40% of the plant's extract. As of 2018, preliminary clinical studies on CBD included research for the treatment of anxiety,  movement disorders, and pain. CBD is available and consumed in multiple forms, including inhalation of smoke or vapor, as an aerosol spray, and by mouth. It may be supplied as an oil containing CBD, capsules, dried cannabis, or as a liquid solution. CBD is thought not to be as  psychoactive as THC (delta-9-tetrahydrocannabinol - the chemical in marijuana responsible for the "HIGH"). Studies suggest that CBD may interact with different biological target receptors in the body, including cannabinoid and other neurotransmitter receptors. As of 2018 the mechanism of action for its biological effects has not been determined.  Side-effects  Adverse reactions: Dry mouth, diarrhea, decreased appetite, fatigue, drowsiness, malaise, weakness, sleep disturbances, and others.  Drug interactions: CBC may interact with other medications such as blood-thinners. (Last update: 01/03/2020) ____________________________________________________________________________________________

## 2020-01-07 NOTE — Telephone Encounter (Signed)
Pt left a voicemail stating he was calling to let us know that [facility] in [place] has the 5mg  oxycodone that he is being prescribed. He states that Dr Delane Ginger told him to call around pharmacies to find one that has it before he would send in his RX.

## 2020-01-07 NOTE — Progress Notes (Unsigned)
[facility] did not have his medicine and therefore I had to resend that to [facility].

## 2020-01-07 NOTE — Progress Notes (Signed)
Nursing Pain Medication Assessment:  Safety precautions to be maintained throughout the outpatient stay will include: orient to surroundings, keep bed in low position, maintain call bell within reach at all times, provide assistance with transfer out of bed and ambulation.  Medication Inspection Compliance: Pill count conducted under aseptic conditions, in front of the patient. Neither the pills nor the bottle was removed from the patient's sight at any time. Once count was completed pills were immediately returned to the patient in their original bottle.  Medication: Oxycodone ER (OxyContin) Pill/Patch Count: 0 of 120 pills remain Pill/Patch Appearance: Markings consistent with prescribed medication Bottle Appearance: Standard pharmacy container. Clearly labeled. Filled Date: 7 / 14 / 2021 Last Medication intake:  Today

## 2020-01-07 NOTE — Progress Notes (Signed)
PROVIDER NOTE: Information contained herein reflects review and annotations entered in association with encounter. Interpretation of such information and data should be left to medically-trained personnel. Information provided to patient can be located elsewhere in the medical record under "Patient Instructions". Document created using STT-dictation technology, any transcriptional errors that may result from process are unintentional.    Patient: Aaron Short  Service Category: E/M  Provider: Gaspar Cola, MD  DOB: 22-Oct-1945  DOS: 01/07/2020  Specialty: Interventional Pain Management  MRN: [id number]  Setting: Ambulatory outpatient  PCP: Neomia Dear, MD  Type: Established Patient    Referring Provider: Neomia Dear, MD  Location: Office  Delivery: Face-to-face     HPI  Reason for encounter: [name], a 74 y.o. year old male, is here today for evaluation and management of his Chronic pain of both shoulders [M25.511, G89.29, M25.512]. [name] primary complain today is Shoulder Pain (left) and Back Pain (lower) Last encounter: Practice (10/07/2019). My last encounter with him was on Visit date not found. Pertinent problems: [name] has Chronic hip pain (Secondary area of Pain) (Bilateral) (R>L); Chronic shoulder pain (Primary Area of Pain) (Bilateral) (L>R); Chronic low back pain (Third area of Pain) (Bilateral) (R>L); Chronic knee pain (Bilateral) (R>L); S/P THR: total hip replacement (Bilateral); S/P TKR: total knee replacement (Bilateral); Malignant neoplasm of urinary bladder ([place]); Muscle spasticity; History of hip fracture; History of pelvic fracture; Chronic neck pain; Chondrocalcinosis; Chronic shoulder pain (Radicular); History of shoulder surgery; Myofascial pain; Chronic musculoskeletal pain; Muscle spasm; Lumbar facet syndrome (Bilateral) (R>L); Lumbar spondylosis; Malignant neoplasm of bladder ([place]); Malignant neoplasm of overlapping sites  of bladder ([place]); Chronic pain syndrome; Chronic acromioclavicular joint pain (Left); Osteoarthritis of shoulder (Bilateral) (L>R); Acromioclavicular arthrosis (Bilateral) (L>R); Arthralgia of acromioclavicular joint (Bilateral) (L>R); Involuntary muscle contractions; Strain of lumbar region; DDD (degenerative disc disease), cervical; DDD (degenerative disc disease), lumbosacral; Lumbosacral Grade 1 Retrolisthesis of L5/S1; Neurogenic pain; Chronic hip pain after THR (Bilateral); Chronic left-sided thoracic back pain; Acute pain of left shoulder; T12 compression fracture, sequela; and Chronic shoulder pain (Left) on their pertinent problem list. Pain Assessment: Severity of Chronic pain is reported as a 3 /10. Location: Shoulder Left/denies. Onset: More than a month ago. Quality: Aching, Burning (popping). Timing: Constant. Modifying factor(s): rest, "I watch what I do". Vitals:  height is '5\' 8"'$  (1.727 m) and weight is 197 lb (89.4 kg). His temperature is 97.2 F (36.2 C) (abnormal). His blood pressure is 112/68 and his pulse is 62. His respiration is 16 and oxygen saturation is 99%.    The patient indicates doing well with the current medication regimen. No adverse reactions or side effects reported to the medications. The patient refers having fallen and aggravated his lower back and shoulder. In addition to this he refers that he is dealing with an infected tooth for which he is taking some antibiotics. I asked him if he was interested in any type of interventional therapies to help with his pain but he indicated that he was not. He is currently on the oxycodone IR 5 mg and I have informed him of the shortage. I offered to change his medicine but he indicated that he has been doing well on this and therefore he wants to stay on it. Should he find difficulties getting this medication failed, he is currently taking oxycodone IR 5 mg 1 tablet p.o. every 6 hours (20 mg/day of oxycodone) with an MME equivalent to  30 mg/day. Therefore, we could switch him to  MS Contin 15 mg every 12 hours. Today we will update his UDS and I will see him back in 1 month to see how he is doing. If he is having problems with obtaining his medication, then we will switch him to the MS Contin. After that, once we have stabilized him on the medication, then we will be transferring his care to Dr. Holley Raring for long-term follow-up. We have provided him with enough medication to last until 02/08/2020.  Pharmacotherapy Assessment   Analgesic: Oxycodone IR 5 mg, 1 tab PO q 6hrs (20 mg/dayof oxycodone) MME: 72m/day.  When this patient first came to me around 2016, he had an MME of 105 mg/day.   Monitoring: [place] PMP: PDMP reviewed during this encounter.       Pharmacotherapy: No side-effects or adverse reactions reported. Compliance: No problems identified. Effectiveness: Clinically acceptable.  GIgnatius Specking RN  01/07/2020 10:30 AM  Sign when Signing Visit Nursing Pain Medication Assessment:  Safety precautions to be maintained throughout the outpatient stay will include: orient to surroundings, keep bed in low position, maintain call bell within reach at all times, provide assistance with transfer out of bed and ambulation.  Medication Inspection Compliance: Pill count conducted under aseptic conditions, in front of the patient. Neither the pills nor the bottle was removed from the patient's sight at any time. Once count was completed pills were immediately returned to the patient in their original bottle.  Medication: Oxycodone ER (OxyContin) Pill/Patch Count: 0 of 120 pills remain Pill/Patch Appearance: Markings consistent with prescribed medication Bottle Appearance: Standard pharmacy container. Clearly labeled. Filled Date: 7 / 14 / 2021 Last Medication intake:  Today    UDS:  Summary  Date Value Ref Range Status  02/20/2019 Note  Final    Comment:     ==================================================================== Tox[employer] Select 13 (MW) ==================================================================== Test                             Result       Flag       Units Drug Present   Oxycodone                      3557                    ng/mg creat   Oxymorphone                    >4274                   ng/mg creat   Noroxycodone                   >4274                   ng/mg creat   Noroxymorphone                 2144                    ng/mg creat    Sources of oxycodone are scheduled prescription medications.    Oxymorphone, noroxycodone, and noroxymorphone are expected    metabolites of oxycodone. Oxymorphone is also available as a    scheduled prescription medication. ==================================================================== Test                      Result    Flag  Units      Ref Range   Creatinine              234              mg/dL      >=20 ==================================================================== Declared Medications:  Medication list was not provided. ==================================================================== For clinical consultation, please call [phone]. ====================================================================      ROS  Constitutional: Denies any fever or chills Gastrointestinal: No reported hemesis, hematochezia, vomiting, or acute GI distress Musculoskeletal: Denies any acute onset joint swelling, redness, loss of ROM, or weakness Neurological: No reported episodes of acute onset apraxia, aphasia, dysarthria, agnosia, amnesia, paralysis, loss of coordination, or loss of consciousness  Medication Review  Magnesium, acetaminophen, carisoprodol, digoxin, furosemide, metoprolol succinate, naloxone, oxyCODONE, rivaroxaban, rosuvastatin, and triamcinolone cream  History Review  Allergy: [name] is allergic to diltiazem, penicillins, and  tizanidine. Drug: [name]  reports no history of drug use. Alcohol:  reports no history of alcohol use. Tobacco:  reports that he has been smoking cigarettes. He has been smoking about 0.50 packs per day. He has never used smokeless tobacco. Social: [name]  reports that he has been smoking cigarettes. He has been smoking about 0.50 packs per day. He has never used smokeless tobacco. He reports that he does not drink alcohol and does not use drugs. Medical:  has a past medical history of Arthralgia of lower leg (04/02/2012), Arthralgia of upper arm (06/18/2009), Cancer ([place]), Diabetes mellitus without complication ([place]), FH: atrial fibrillation, and Hypertension. Surgical: [name]  has a past surgical history that includes bladder cancer surgery (2015); Joint replacement; Replacement total hip w/  resurfacing implants (Bilateral); Total knee arthroplasty (Bilateral); and Shoulder surgery (Bilateral). Family: family history includes Dementia in his mother; Heart disease in his father.  Laboratory Chemistry Profile   Renal Lab Results  Component Value Date   BUN 11 02/10/2019   CREATININE 0.85 02/10/2019   BCR 13 02/10/2019   GFRAA 99 02/10/2019   GFRNONAA 85 02/10/2019     Hepatic Lab Results  Component Value Date   AST 15 02/10/2019   ALT 17 11/18/2015   ALBUMIN 4.0 02/10/2019   ALKPHOS 89 02/10/2019     Electrolytes Lab Results  Component Value Date   NA 141 02/10/2019   K 4.9 02/10/2019   CL 102 02/10/2019   CALCIUM 9.4 02/10/2019   MG 1.9 02/10/2019     Bone Lab Results  Component Value Date   25OHVITD1 32 02/10/2019   25OHVITD2 <1.0 02/10/2019   25OHVITD3 32 02/10/2019     Inflammation (CRP: Acute Phase) (ESR: Chronic Phase) Lab Results  Component Value Date   CRP 7 02/10/2019   ESRSEDRATE 21 02/10/2019       Note: Above Lab results reviewed.  Recent Imaging Review  DG Lumbar Spine Complete W/Bend CLINICAL DATA:  Low back pain for  1 month radiating down LEFT leg, injured back lifting something heavy, lumbar spondylosis, lumbar facet syndrome  EXAM: LUMBAR SPINE - COMPLETE WITH BENDING VIEWS  COMPARISON:  11/18/2015  FINDINGS: Osseous demineralization.  Five non-rib-bearing lumbar vertebra.  Multilevel endplate spur formation throughout lumbar spine.  Minimal disc space narrowing L4-L5.  Vertebral body heights maintained without fracture or bone destruction.  Minimal retrolisthesis at L5-S1 approximately 3 mm which increases to 6 mm with extension and appears unchanged with flexion.  Remaining alignments normal it out abnormal motion with flexion or extension.  SI joints preserved.  BILATERAL hip prostheses.  IMPRESSION:  Scattered degenerative disc disease changes of the lumbar spine.  Minimal retrolisthesis at L5-S1 which mildly increases with extension and is unchanged with flexion.  Electronically Signed   By: Lavonia Dana M.D.   On: 11/08/2017 11:02 Note: Reviewed        Physical Exam  General appearance: Well nourished, well developed, and well hydrated. In no apparent acute distress Mental status: Alert, oriented x 3 (person, place, & time)       Respiratory: No evidence of acute respiratory distress Eyes: PERLA Vitals: BP 112/68   Pulse 62   Temp (!) 97.2 F (36.2 C)   Resp 16   Ht '5\' 8"'$  (1.727 m)   Wt 197 lb (89.4 kg)   SpO2 99%   BMI 29.95 kg/m  BMI: Estimated body mass index is 29.95 kg/m as calculated from the following:   Height as of this encounter: '5\' 8"'$  (1.727 m).   Weight as of this encounter: 197 lb (89.4 kg). Ideal: Ideal body weight: 68.4 kg (150 lb 12.7 oz) Adjusted ideal body weight: 76.8 kg (169 lb 4.4 oz)  Assessment   Status Diagnosis  Controlled Controlled Controlled 1. Chronic shoulder pain (Primary Area of Pain) (Bilateral) (L>R)   2. Chronic pain syndrome   3. Lumbar spondylosis   4. Osteoarthritis of shoulder (Bilateral) (L>R)   5. Chronic hip  pain (Secondary area of Pain) (Bilateral) (R>L)   6. Chronic low back pain (Third area of Pain) (Bilateral) (R>L)      Updated Problems: No problems updated.  Plan of Care  Problem-specific:  No problem-specific Assessment & Plan notes found for this encounter.  [name] has a current medication list which includes the following long-term medication(s): digoxin, furosemide, metoprolol succinate, rivaroxaban, narcan, and [START ON 01/09/2020] oxycodone.  Pharmacotherapy (Medications Ordered): Meds ordered this encounter  Medications  . oxyCODONE (OXY IR/ROXICODONE) 5 MG immediate release tablet    Sig: Take 1 tablet (5 mg total) by mouth every 6 (six) hours as needed for severe pain. Must last 30 days    Dispense:  120 tablet    Refill:  0    Chronic Pain: STOP Act (Not applicable) Fill 1 day early if closed on refill date. Do not fill until: 01/09/2020. To last until: 02/08/2020. Avoid benzodiazepines within 8 hours of opioids   Orders:  Orders Placed This Encounter  Procedures  . ToxASSURE Select 13 (MW), Urine    Volume: 30 ml(s). Minimum 3 ml of urine is needed. Document temperature of fresh sample. Indications: Long term (current) use of opiate analgesic (C58.850)    Order Specific Question:   Release to patient    Answer:   Immediate   Follow-up plan:   Return in 4 weeks (on 02/04/2020) for (20-min), (F2F), (Med Mgmt), on E/M day to evaluate UDS.      Interventional therapies:  Considering:   NOTE: XARELTO ANTICOAGULATION (Stop: 3 days  Re-start: 6 hrs)  Diagnostic left T12-L1 LESI  Diagnostic bilateral lumbar facet block #1  Possible bilateral lumbar facet RFA  Diagnostic/therapeutic left intercostal NB   Therapeutic left intercostal RFA  Diagnostic bilateral IA shoulder joint injection  Diagnostic right suprascapular NB  Possible left suprascapular nerve RFA  Diagnostic bilateral acromioclavicular joint injection  Diagnostic midline CESI  Diagnostic  bilateral cervical facet block  Possible bilateral cervical facet RFA    Palliative PRN treatment(s):   Diagnostic left suprascapular NB #2       Recent Visits No visits were found meeting  these conditions. Showing recent visits within past 90 days and meeting all other requirements Today's Visits Date Type Provider Dept  01/07/20 Office Visit Milinda Pointer, MD [facility]  Showing today's visits and meeting all other requirements Future Appointments No visits were found meeting these conditions. Showing future appointments within next 90 days and meeting all other requirements  I discussed the assessment and treatment plan with the patient. The patient was provided an opportunity to ask questions and all were answered. The patient agreed with the plan and demonstrated an understanding of the instructions.  Patient advised to call back or seek an in-person evaluation if the symptoms or condition worsens.  Duration of encounter: 36 minutes.  Note by: Gaspar Cola, MD Date: 01/07/2020; Time: 11:02 AM

## 2020-01-08 NOTE — Telephone Encounter (Signed)
Called [facility] in [place] and cancelled oxycodone 5 mg that was sent in on 01/07/20 and has been resent to [facility] in [place].

## 2020-01-09 LAB — TOXASSURE SELECT 13 (MW), URINE

## 2020-01-12 ENCOUNTER — Other Ambulatory Visit: Payer: Self-pay | Admitting: Pain Medicine

## 2020-01-28 ENCOUNTER — Encounter: Payer: Medicare Other | Admitting: Pain Medicine

## 2020-02-03 ENCOUNTER — Encounter: Payer: Medicare Other | Admitting: Pain Medicine

## 2020-02-04 ENCOUNTER — Encounter: Payer: Self-pay | Admitting: Student in an Organized Health Care Education/Training Program

## 2020-02-04 ENCOUNTER — Telehealth: Payer: Self-pay

## 2020-02-05 ENCOUNTER — Other Ambulatory Visit: Payer: Self-pay

## 2020-02-05 ENCOUNTER — Encounter: Payer: Self-pay | Admitting: Student in an Organized Health Care Education/Training Program

## 2020-02-05 ENCOUNTER — Ambulatory Visit
Payer: Medicare Other | Attending: Pain Medicine | Admitting: Student in an Organized Health Care Education/Training Program

## 2020-02-05 DIAGNOSIS — M545 Low back pain, unspecified: Secondary | ICD-10-CM

## 2020-02-05 DIAGNOSIS — M19011 Primary osteoarthritis, right shoulder: Secondary | ICD-10-CM | POA: Diagnosis not present

## 2020-02-05 DIAGNOSIS — M25559 Pain in unspecified hip: Secondary | ICD-10-CM

## 2020-02-05 DIAGNOSIS — M25511 Pain in right shoulder: Secondary | ICD-10-CM

## 2020-02-05 DIAGNOSIS — Z79891 Long term (current) use of opiate analgesic: Secondary | ICD-10-CM

## 2020-02-05 DIAGNOSIS — G894 Chronic pain syndrome: Secondary | ICD-10-CM

## 2020-02-05 DIAGNOSIS — G8929 Other chronic pain: Secondary | ICD-10-CM

## 2020-02-05 DIAGNOSIS — Z79899 Other long term (current) drug therapy: Secondary | ICD-10-CM

## 2020-02-05 DIAGNOSIS — S22080S Wedge compression fracture of T11-T12 vertebra, sequela: Secondary | ICD-10-CM

## 2020-02-05 DIAGNOSIS — M19012 Primary osteoarthritis, left shoulder: Secondary | ICD-10-CM

## 2020-02-05 DIAGNOSIS — M25512 Pain in left shoulder: Secondary | ICD-10-CM

## 2020-02-05 DIAGNOSIS — M47816 Spondylosis without myelopathy or radiculopathy, lumbar region: Secondary | ICD-10-CM | POA: Diagnosis not present

## 2020-02-05 MED ORDER — OXYCODONE HCL 5 MG PO TABS: 5.0000 mg | ORAL_TABLET | Freq: Four times a day (QID) | ORAL | 0 refills | Status: DC | PRN

## 2020-02-05 NOTE — Progress Notes (Signed)
Patient: Aaron Short  Service Category: E/M  Provider: Gillis Santa, MD  DOB: February 23, 1946  DOS: 02/05/2020  Location: Office  MRN: [id number]  Setting: Ambulatory outpatient  Referring Provider: Neomia Dear, MD  Type: Established Patient  Specialty: Interventional Pain Management  PCP: Neomia Dear, MD  Location: Home  Delivery: TeleHealth     Virtual Encounter - Pain Management PROVIDER NOTE: Information contained herein reflects review and annotations entered in association with encounter. Interpretation of such information and data should be left to medically-trained personnel. Information provided to patient can be located elsewhere in the medical record under "Patient Instructions". Document created using STT-dictation technology, any transcriptional errors that may result from process are unintentional.    Contact & Pharmacy Preferred: [phone] Home: [phone] (home) Mobile: [phone] (mobile) E-mail: [email or web address] [place] [address] AT [place] [facility] [place] [address] Phone: [phone] Fax: [phone]   Pre-screening  Mr. Taney offered "in-person" vs "virtual" encounter. He indicated preferring virtual for this encounter.   Reason COVID-19*  Social distancing based on CDC and AMA recommendations.   I contacted Benjie Ricketson on 02/05/2020 via video conference.      I clearly identified myself as Gillis Santa, MD. I verified that I was speaking with the correct person using two identifiers (Name: Welden Hausmann, and date of birth: 12-09-1945).  Consent I sought verbal advanced consent from [name] for virtual visit interactions. I informed Mr. Laws of possible security and privacy concerns, risks, and limitations associated with providing "not-in-person" medical evaluation and management services. I also informed Mr. Couey of the  availability of "in-person" appointments. Finally, I informed him that there would be a charge for the virtual visit and that he could be  personally, fully or partially, financially responsible for it. Mr. Fronczak expressed understanding and agreed to proceed.   Historic Elements   Mr. Clemons Salvucci is a 74 y.o. year old, male patient evaluated today after our last contact on Visit date not found. Mr. Donahoe  has a past medical history of Arthralgia of lower leg (04/02/2012), Arthralgia of upper arm (06/18/2009), Cancer ([place]), Diabetes mellitus without complication ([place]), FH: atrial fibrillation, and Hypertension. He also  has a past surgical history that includes bladder cancer surgery (2015); Joint replacement; Replacement total hip w/  resurfacing implants (Bilateral); Total knee arthroplasty (Bilateral); and Shoulder surgery (Bilateral). Mr. Schauer has a current medication list which includes the following prescription(s): acetaminophen, carisoprodol, digoxin, furosemide, magnesium, metoprolol succinate, [START ON 02/07/2020] oxycodone, [START ON 03/08/2020] oxycodone, [START ON 04/07/2020] oxycodone, rivaroxaban, rosuvastatin, triamcinolone cream, and narcan. He  reports that he has been smoking cigarettes. He has been smoking about 0.50 packs per day. He has never used smokeless tobacco. He reports that he does not drink alcohol and does not use drugs. Mr. Dorris is allergic to diltiazem, penicillins, and tizanidine.   HPI  Today, he is being contacted for medication management.   Fell in shower night before last (3rd time in 6 months), causing increased low back pain. Slipped on soap in bathtub. Some minor bruising, acute on chronic low back pain.  Instructed patient to monitor it and if he gets worse or does not improve to contact our clinic at which point I will obtain imaging of his back and see him in clinic face-to-face. This is my first encounter with the patient, he  was transferred to me from my colleague, Dr. Dossie Arbour for  medication management. Continues oxycodone 5 mg every 4 to 6 hours as needed as prescribed.  Finds analgesic benefit with this medication and improvement in functional status.  We will refill as below.  UDS up-to-date and appropriate.  Pharmacotherapy Assessment  Analgesic: 01/09/2020  1   01/07/2020  Oxycodone Hcl 5 MG Tablet  120.00  30 Fr Nav   [id number]   Wal (8665)   0/0  30.00 MME  Medicare   [place]     Monitoring: [place] PMP: PDMP not reviewed this encounter.       Pharmacotherapy: No side-effects or adverse reactions reported. Compliance: No problems identified. Effectiveness: Clinically acceptable. Plan: Refer to "POC".  UDS:  Summary  Date Value Ref Range Status  01/07/2020 Note  Final    Comment:    ==================================================================== [employer] Select 13 (MW) ==================================================================== Test                             Result       Flag       Units  Drug Present and Declared for Prescription Verification   Oxycodone                      2615         EXPECTED   ng/mg creat   Oxymorphone                    2239         EXPECTED   ng/mg creat   Noroxycodone                   >4049        EXPECTED   ng/mg creat   Noroxymorphone                 1409         EXPECTED   ng/mg creat    Sources of oxycodone are scheduled prescription medications.    Oxymorphone, noroxycodone, and noroxymorphone are expected    metabolites of oxycodone. Oxymorphone is also available as a    scheduled prescription medication.  ==================================================================== Test                      Result    Flag   Units      Ref Range   Creatinine              247              mg/dL      >=20 ==================================================================== Declared Medications:  The flagging and interpretation on this report are based on the   following declared medications.  Unexpected results may arise from  inaccuracies in the declared medications.   **Note: The testing scope of this panel includes these medications:   Oxycodone   **Note: The testing scope of this panel does not include the  following reported medications:   Acetaminophen (Tylenol)  Carisoprodol (Soma)  Digoxin (Lanoxin)  Furosemide (Lasix)  Magnesium  Metoprolol (Toprol)  Naloxone (Narcan)  Rivaroxaban (Xarelto)  Rosuvastatin (Crestor)  Triamcinolone (Kenalog) ==================================================================== For clinical consultation, please call [phone]. ====================================================================     Laboratory Chemistry Profile   Renal Lab Results  Component Value Date   BUN 11 02/10/2019   CREATININE 0.85 02/10/2019   BCR 13 02/10/2019   GFRAA 99 02/10/2019   GFRNONAA 85 02/10/2019     Hepatic Lab Results  Component Value Date   AST 15 02/10/2019   ALT 17 11/18/2015   ALBUMIN 4.0 02/10/2019   ALKPHOS 89 02/10/2019     Electrolytes Lab Results  Component Value Date   NA 141 02/10/2019   K 4.9 02/10/2019   CL 102 02/10/2019   CALCIUM 9.4 02/10/2019   MG 1.9 02/10/2019     Bone Lab Results  Component Value Date   25OHVITD1 32 02/10/2019   25OHVITD2 <1.0 02/10/2019   25OHVITD3 32 02/10/2019     Inflammation (CRP: Acute Phase) (ESR: Chronic Phase) Lab Results  Component Value Date   CRP 7 02/10/2019   ESRSEDRATE 21 02/10/2019       Note: Above Lab results reviewed.  Imaging  DG Lumbar Spine Complete W/Bend CLINICAL DATA:  Low back pain for 1 month radiating down LEFT leg, injured back lifting something heavy, lumbar spondylosis, lumbar facet syndrome  EXAM: LUMBAR SPINE - COMPLETE WITH BENDING VIEWS  COMPARISON:  11/18/2015  FINDINGS: Osseous demineralization.  Five non-rib-bearing lumbar vertebra.  Multilevel endplate spur formation throughout  lumbar spine.  Minimal disc space narrowing L4-L5.  Vertebral body heights maintained without fracture or bone destruction.  Minimal retrolisthesis at L5-S1 approximately 3 mm which increases to 6 mm with extension and appears unchanged with flexion.  Remaining alignments normal it out abnormal motion with flexion or extension.  SI joints preserved.  BILATERAL hip prostheses.  IMPRESSION: Scattered degenerative disc disease changes of the lumbar spine.  Minimal retrolisthesis at L5-S1 which mildly increases with extension and is unchanged with flexion.  Electronically Signed   By: Lavonia Dana M.D.   On: 11/08/2017 11:02  Assessment  The primary encounter diagnosis was Chronic pain syndrome. Diagnoses of Lumbar spondylosis, Osteoarthritis of shoulder (Bilateral) (L>R), Chronic shoulder pain (Primary Area of Pain) (Bilateral) (L>R), Chronic hip pain (Secondary area of Pain) (Bilateral) (R>L), Chronic low back pain (Third area of Pain) (Bilateral) (R>L), T12 compression fracture, sequela, Long term current use of opiate analgesic, and Long-term use of high-risk medication were also pertinent to this visit.  Plan of Care   Mr. Kushal Saunders has a current medication list which includes the following long-term medication(s): digoxin, furosemide, metoprolol succinate, [START ON 02/07/2020] oxycodone, [START ON 03/08/2020] oxycodone, [START ON 04/07/2020] oxycodone, rivaroxaban, and narcan.  Pharmacotherapy (Medications Ordered): Meds ordered this encounter  Medications  . oxyCODONE (OXY IR/ROXICODONE) 5 MG immediate release tablet    Sig: Take 1 tablet (5 mg total) by mouth every 6 (six) hours as needed for severe pain. Must last 30 days    Dispense:  120 tablet    Refill:  0    Chronic Pain: STOP Act (Not applicable) Fill 1 day early if closed on refill date.  Marland Kitchen oxyCODONE (OXY IR/ROXICODONE) 5 MG immediate release tablet    Sig: Take 1 tablet (5 mg total) by mouth every 6  (six) hours as needed for severe pain. Must last 30 days    Dispense:  120 tablet    Refill:  0    Chronic Pain: STOP Act (Not applicable) Fill 1 day early if closed on refill date.  Marland Kitchen oxyCODONE (OXY IR/ROXICODONE) 5 MG immediate release tablet    Sig: Take 1 tablet (5 mg total) by mouth every 6 (six) hours as needed for severe pain. Must last 30 days    Dispense:  120 tablet    Refill:  0    Chronic Pain: STOP Act (Not applicable) Fill 1 day early if closed on refill  date.   Follow-up plan:   Return in about 3 months (around 05/06/2020) for Medication Management, in person.   Recent Visits Date Type Provider Dept  01/07/20 Office Visit Milinda Pointer, MD [facility]  Showing recent visits within past 90 days and meeting all other requirements Today's Visits Date Type Provider Dept  02/05/20 Telemedicine Gillis Santa, MD [facility]  Showing today's visits and meeting all other requirements Future Appointments No visits were found meeting these conditions. Showing future appointments within next 90 days and meeting all other requirements  I discussed the assessment and treatment plan with the patient. The patient was provided an opportunity to ask questions and all were answered. The patient agreed with the plan and demonstrated an understanding of the instructions.  Patient advised to call back or seek an in-person evaluation if the symptoms or condition worsens.  Duration of encounter: 30 minutes.  Note by: Gillis Santa, MD Date: 02/05/2020; Time: 3:59 PM

## 2020-03-01 ENCOUNTER — Encounter: Payer: Self-pay | Admitting: Student in an Organized Health Care Education/Training Program

## 2020-03-02 ENCOUNTER — Telehealth: Payer: Self-pay

## 2020-03-02 NOTE — Telephone Encounter (Signed)
Please schedule patient  To come in for F2F on 03-04-2020 per Dr Holley Raring for med management and to evaluate new problem.

## 2020-03-03 ENCOUNTER — Telehealth: Payer: Self-pay

## 2020-03-03 NOTE — Telephone Encounter (Signed)
Dr. Alford Highland from [facility] would like to discuss this patient with you. Please call her at your convenience at [phone].

## 2020-03-09 ENCOUNTER — Encounter: Payer: Self-pay | Admitting: Student in an Organized Health Care Education/Training Program

## 2020-03-09 ENCOUNTER — Ambulatory Visit
Payer: Medicare Other | Attending: Student in an Organized Health Care Education/Training Program | Admitting: Student in an Organized Health Care Education/Training Program

## 2020-03-09 ENCOUNTER — Other Ambulatory Visit: Payer: Self-pay

## 2020-03-09 VITALS — BP 92/53 | HR 62 | Temp 97.3°F | Resp 16 | Ht 68.0 in | Wt 188.0 lb

## 2020-03-09 DIAGNOSIS — Z79891 Long term (current) use of opiate analgesic: Secondary | ICD-10-CM

## 2020-03-09 DIAGNOSIS — S22080S Wedge compression fracture of T11-T12 vertebra, sequela: Secondary | ICD-10-CM | POA: Diagnosis present

## 2020-03-09 DIAGNOSIS — M545 Low back pain, unspecified: Secondary | ICD-10-CM

## 2020-03-09 DIAGNOSIS — M25511 Pain in right shoulder: Secondary | ICD-10-CM | POA: Diagnosis present

## 2020-03-09 DIAGNOSIS — G8929 Other chronic pain: Secondary | ICD-10-CM | POA: Diagnosis present

## 2020-03-09 DIAGNOSIS — M19012 Primary osteoarthritis, left shoulder: Secondary | ICD-10-CM | POA: Diagnosis present

## 2020-03-09 DIAGNOSIS — Z79899 Other long term (current) drug therapy: Secondary | ICD-10-CM | POA: Diagnosis present

## 2020-03-09 DIAGNOSIS — M25559 Pain in unspecified hip: Secondary | ICD-10-CM | POA: Diagnosis present

## 2020-03-09 DIAGNOSIS — M25512 Pain in left shoulder: Secondary | ICD-10-CM | POA: Diagnosis present

## 2020-03-09 DIAGNOSIS — M19011 Primary osteoarthritis, right shoulder: Secondary | ICD-10-CM

## 2020-03-09 DIAGNOSIS — M47816 Spondylosis without myelopathy or radiculopathy, lumbar region: Secondary | ICD-10-CM

## 2020-03-09 DIAGNOSIS — G894 Chronic pain syndrome: Secondary | ICD-10-CM

## 2020-03-09 MED ORDER — OXYCODONE-ACETAMINOPHEN 10-325 MG PO TABS
1.0000 | ORAL_TABLET | Freq: Four times a day (QID) | ORAL | 0 refills | Status: DC | PRN
Start: 2020-03-09 — End: 2020-04-08

## 2020-03-09 MED ORDER — OXYCODONE-ACETAMINOPHEN 10-325 MG PO TABS
1.0000 | ORAL_TABLET | Freq: Four times a day (QID) | ORAL | 0 refills | Status: DC | PRN
Start: 2020-04-08 — End: 2020-05-08

## 2020-03-09 NOTE — Progress Notes (Signed)
Nursing Pain Medication Assessment:  Safety precautions to be maintained throughout the outpatient stay will include: orient to surroundings, keep bed in low position, maintain call bell within reach at all times, provide assistance with transfer out of bed and ambulation.  Medication Inspection Compliance: Pill count conducted under aseptic conditions, in front of the patient. Neither the pills nor the bottle was removed from the patient's sight at any time. Once count was completed pills were immediately returned to the patient in their original bottle.  Medication: Oxycodone IR Pill/Patch Count: 102 of 120 pills remain Pill/Patch Appearance: Markings consistent with prescribed medication Bottle Appearance: Standard pharmacy container. Clearly labeled. Filled Date: 10 / 11 / 2021 Last Medication intake:  Today

## 2020-03-09 NOTE — Progress Notes (Signed)
PROVIDER NOTE: Information contained herein reflects review and annotations entered in association with encounter. Interpretation of such information and data should be left to medically-trained personnel. Information provided to patient can be located elsewhere in the medical record under "Patient Instructions". Document created using STT-dictation technology, any transcriptional errors that may result from process are unintentional.    Patient: Aaron Short  Service Category: E/M  Provider: Gillis Santa, MD  DOB: 02/24/1946  DOS: 03/09/2020  Specialty: Interventional Pain Management  MRN: [id number]  Setting: Ambulatory outpatient  PCP: Neomia Dear, MD  Type: Established Patient    Referring Provider: Neomia Dear, MD  Location: Office  Delivery: Face-to-face     HPI  Aaron Short, a 74 y.o. year old male, is here today because of his Lumbar spondylosis [M47.816]. Mr. Cordell primary complain today is Back Pain (lower) Last encounter: My last encounter with him was on Visit date not found. Pertinent problems: Aaron Short has Long term current use of opiate analgesic; Long term prescription opiate use; Opioid dependence, daily use ([place]); Chronic hip pain (Secondary area of Pain) (Bilateral) (R>L); Chronic shoulder pain (Primary Area of Pain) (Bilateral) (L>R); Chronic low back pain (Third area of Pain) (Bilateral) (R>L); S/P THR: total hip replacement (Bilateral); Lumbar facet syndrome (Bilateral) (R>L); Lumbar spondylosis; Chronic pain syndrome; Chronic acromioclavicular joint pain (Left); Osteoarthritis of shoulder (Bilateral) (L>R); and T12 compression fracture, sequela on their pertinent problem list. Pain Assessment: Severity of Chronic pain is reported as a 2 /10. Location: Back Lower/sometimes "shocking" sensation down right arm to fingers. Onset: More than a month ago. Quality: Hervey Ard, Shooting. Timing: Constant. Modifying factor(s): heat, cold,  meds. Vitals:  height is _0  (1.727 m) and weight is 188 lb (85.3 kg). His temporal temperature is 97.3 F (36.3 C) (abnormal). His blood pressure is 92/53 (abnormal) and his pulse is 62. His respiration is 16 and oxygen saturation is 98%.   Reason for encounter: medication management.   Patient presents today for medication management.  Of note this patient has transferred care from my colleague Dr. Dossie Arbour to myself.  This is my first time seeing him in person.  Of note, patient sustained a fall at the end of September resulting in thoracic compression fractures.  Of note patient does have a history of thoracic compression fracture.  Patient's most recent CT scan shows subacute compression fractures at T12 and L2.  Patient has been referred by Dr. Alford Highland with [facility] family medicine to [facility], orthopedic spine to consider vertebroplasty/kyphoplasty for subacute thoracic and lumbar compression fracture.  In the interim, given his increased pain from his subacute compression fractures, will increase his oxycodone from 5 mg to 10 mg 4 times daily as needed.  Patient states that he is currently utilizing oxycodone 10 to 15 mg at a time given increased pain.  He states that his current regimen is not managing his pain in the context of acute on chronic pain.  I encouraged him to follow-up with [facility] regarding vertebral augmentation for his compression fracture.  Patient endorsed understanding.  Pharmacotherapy Assessment   Analgesic: Previously on oxycodone 5 mg 4 times daily as needed, quantity 120/month; MME equals 30.  Temporary increase to Percocet 10 mg 4 times daily as needed, quantity 120/month given subacute compression fractures resulting in acute on chronic pain.     Monitoring: [place] PMP: PDMP not reviewed this encounter.       Pharmacotherapy: No side-effects or adverse reactions reported. Compliance: No problems identified. Effectiveness: Clinically acceptable.  Rise Patience,  RN  03/09/2020  1:34 PM  Sign when Signing Visit Notified pharmacy (Meghan at [facility]) to cancel Oxycodone 5 Rx).  Rise Patience, RN  03/09/2020  1:03 PM  Sign when Signing Visit Nursing Pain Medication Assessment:  Safety precautions to be maintained throughout the outpatient stay will include: orient to surroundings, keep bed in low position, maintain call bell within reach at all times, provide assistance with transfer out of bed and ambulation.  Medication Inspection Compliance: Pill count conducted under aseptic conditions, in front of the patient. Neither the pills nor the bottle was removed from the patient's sight at any time. Once count was completed pills were immediately returned to the patient in their original bottle.  Medication: Oxycodone IR Pill/Patch Count: 102 of 120 pills remain Pill/Patch Appearance: Markings consistent with prescribed medication Bottle Appearance: Standard pharmacy container. Clearly labeled. Filled Date: 10 / 11 / 2021 Last Medication intake:  Today    UDS:  Summary  Date Value Ref Range Status  01/07/2020 Note  Final    Comment:    ==================================================================== [employer] Select 13 (MW) ==================================================================== Test                             Result       Flag       Units  Drug Present and Declared for Prescription Verification   Oxycodone                      2615         EXPECTED   ng/mg creat   Oxymorphone                    2239         EXPECTED   ng/mg creat   Noroxycodone                   >4049        EXPECTED   ng/mg creat   Noroxymorphone                 1409         EXPECTED   ng/mg creat    Sources of oxycodone are scheduled prescription medications.    Oxymorphone, noroxycodone, and noroxymorphone are expected    metabolites of oxycodone. Oxymorphone is also available as a    scheduled prescription  medication.  ==================================================================== Test                      Result    Flag   Units      Ref Range   Creatinine              247              mg/dL      >=20 ==================================================================== Declared Medications:  The flagging and interpretation on this report are based on the  following declared medications.  Unexpected results may arise from  inaccuracies in the declared medications.   **Note: The testing scope of this panel includes these medications:   Oxycodone   **Note: The testing scope of this panel does not include the  following reported medications:   Acetaminophen (Tylenol)  Carisoprodol (Soma)  Digoxin (Lanoxin)  Furosemide (Lasix)  Magnesium  Metoprolol (Toprol)  Naloxone (Narcan)  Rivaroxaban (Xarelto)  Rosuvastatin (Crestor)  Triamcinolone (Kenalog) ==================================================================== For clinical consultation, please call [phone]. ====================================================================  ROS  Constitutional: Denies any fever or chills Gastrointestinal: No reported hemesis, hematochezia, vomiting, or acute GI distress Musculoskeletal: Mid back pain, low back pain, right greater than left arm pain Neurological: No reported episodes of acute onset apraxia, aphasia, dysarthria, agnosia, amnesia, paralysis, loss of coordination, or loss of consciousness  Medication Review  Magnesium, acetaminophen, carisoprodol, digoxin, furosemide, metoprolol succinate, naloxone, oxyCODONE-acetaminophen, rivaroxaban, rosuvastatin, and triamcinolone cream  History Review  Allergy: Aaron Short is allergic to diltiazem, penicillins, and tizanidine. Drug: Aaron Short  reports no history of drug use. Alcohol:  reports no history of alcohol use. Tobacco:  reports that he has been smoking cigarettes. He has been smoking about 0.50  packs per day. He has never used smokeless tobacco. Social: Aaron Short  reports that he has been smoking cigarettes. He has been smoking about 0.50 packs per day. He has never used smokeless tobacco. He reports that he does not drink alcohol and does not use drugs. Medical:  has a past medical history of Arthralgia of lower leg (04/02/2012), Arthralgia of upper arm (06/18/2009), Cancer ([place]), Diabetes mellitus without complication ([place]), FH: atrial fibrillation, and Hypertension. Surgical: Aaron Short  has a past surgical history that includes bladder cancer surgery (2015); Joint replacement; Replacement total hip w/  resurfacing implants (Bilateral); Total knee arthroplasty (Bilateral); and Shoulder surgery (Bilateral). Family: family history includes Dementia in his mother; Heart disease in his father.  Laboratory Chemistry Profile   Renal Lab Results  Component Value Date   BUN 11 02/10/2019   CREATININE 0.85 02/10/2019   BCR 13 02/10/2019   GFRAA 99 02/10/2019   GFRNONAA 85 02/10/2019     Hepatic Lab Results  Component Value Date   AST 15 02/10/2019   ALT 17 11/18/2015   ALBUMIN 4.0 02/10/2019   ALKPHOS 89 02/10/2019     Electrolytes Lab Results  Component Value Date   NA 141 02/10/2019   K 4.9 02/10/2019   CL 102 02/10/2019   CALCIUM 9.4 02/10/2019   MG 1.9 02/10/2019     Bone Lab Results  Component Value Date   25OHVITD1 32 02/10/2019   25OHVITD2 <1.0 02/10/2019   25OHVITD3 32 02/10/2019     Inflammation (CRP: Acute Phase) (ESR: Chronic Phase) Lab Results  Component Value Date   CRP 7 02/10/2019   ESRSEDRATE 21 02/10/2019       Note: Above Lab results reviewed.  Recent Imaging Review  DG Lumbar Spine Complete W/Bend CLINICAL DATA:  Low back pain for 1 month radiating down LEFT leg, injured back lifting something heavy, lumbar spondylosis, lumbar facet syndrome  EXAM: LUMBAR SPINE - COMPLETE WITH BENDING VIEWS  COMPARISON:   11/18/2015  FINDINGS: Osseous demineralization.  Five non-rib-bearing lumbar vertebra.  Multilevel endplate spur formation throughout lumbar spine.  Minimal disc space narrowing L4-L5.  Vertebral body heights maintained without fracture or bone destruction.  Minimal retrolisthesis at L5-S1 approximately 3 mm which increases to 6 mm with extension and appears unchanged with flexion.  Remaining alignments normal it out abnormal motion with flexion or extension.  SI joints preserved.  BILATERAL hip prostheses.  IMPRESSION: Scattered degenerative disc disease changes of the lumbar spine.  Minimal retrolisthesis at L5-S1 which mildly increases with extension and is unchanged with flexion.  Electronically Signed   By: Lavonia Dana M.D.   On: 11/08/2017 11:02 Note: Reviewed        Impression Performed by [facility] RAD Compression deformities of the T10 and T11 vertebral bodies described above, likely subacute at T12,  with no bony retropulsion into the canal.   DISH. Narrative Performed by [facility] RAD This result has an attachment that is not available.  EXAM: Computed tomography, thoracic spine without contrast material.  DATE: 03/03/2020 12:30 PM  ACCESSION: [id number] UN  DICTATED: 03/03/2020 12:52 PM  INTERPRETATION LOCATION: [place]   CLINICAL INDICATION: 74 years old Male with f/u Age - indeterminate height loss of multiple thoracic and lumbar vertebral bodies seen on 02/22/20 xrays. Eval for new compression fractures ; Compression fracture, T - spine - S22.000A - Compression fracture of thoracic vertebra, initial encounter (CM    COMPARISON:Thoracic spine radiographs 02/22/2020   TECHNIQUE: Axial CT images through the thoracic spine without contrast. Coronal and sagittal reformatted images, bone and soft tissue algorithm are provided.   FINDINGS:   There is moderate compression deformityof the T11 and T12 vertebral bodies, likely similar to the prior radiographs  allowing for differences in modality. There is approximately 20% loss of vertebral body height anteriorly at T11 and 25-30% loss of height at T12.   Fracture planes remain visible in the T12 vertebral body suggesting subacute chronicity, while no fracture planes are visible at T11. There is no bony retropulsion at any level.   The osseous spinal canal is patent. There is no osseous narrowing of the neural foramina.   Thereis multilevel degenerative endplate change with bulky right-sided greater than left osteophytes throughout the thoracic spine consistent with DISH.   The vertebrae are normally aligned. No paravertebral soft tissue abnormality.   Procedure Note  Interface, Rad Results In - 03/03/2020 1:23 PM EDT  Formatting of this note might be different from the original.  EXAM: Computed tomography, thoracic spine without contrast material.  DATE: 03/03/2020 12:30 PM  ACCESSION: [id number] UN  DICTATED: 03/03/2020 12:52 PM  INTERPRETATION LOCATION: [place]   CLINICAL INDICATION: 74 years old Male with f/u Age - indeterminate height loss of multiple thoracic and lumbar vertebral bodies seen on 02/22/20 xrays. Eval for new compression fractures ; Compressionfracture, T - spine - S22.000A - Compression fracture of thoracic vertebra, initial encounter (CM    COMPARISON: Thoracic spine radiographs 02/22/2020   TECHNIQUE: Axial CT images through the thoracic spine without contrast. Coronal and sagittal reformatted images, bone and soft tissue algorithm are provided.   FINDINGS:   There is moderate compression deformity of the T11 and T12 vertebral bodies, likely similar to the prior radiographs allowing for differences in modality. There is approximately20% loss of vertebral body height anteriorly at T11 and 25-30% loss of height at T12.   Fracture planes remain visible in the T12 vertebral body suggesting subacute chronicity, while no fracture planes are visible at T11. There is no  bony retropulsionat any level.   The osseous spinal canal is patent. There is no osseous narrowing of the neural foramina.   There is multilevel degenerative endplate change with bulky right-sided greater than left osteophytes throughout the thoracic spine consistent with DISH.   The vertebrae are normally aligned. No paravertebral soft tissue abnormality.    IMPRESSION:  Compression deformities of the T10 and T11 vertebral bodies described above, likely subacute at T12, with no bony retropulsion into the canal.     Physical Exam  General appearance: alert, cooperative and in mild distress Mental status: Alert, oriented x 3 (person, place, & time)       Respiratory: No evidence of acute respiratory distress Eyes: PERLA Vitals: BP (!) 92/53   Pulse 62   Temp (!) 97.3 F (36.3 C) (Temporal)  Resp 16   Ht _0  (1.727 m)   Wt 188 lb (85.3 kg)   SpO2 98%   BMI 28.59 kg/m  BMI: Estimated body mass index is 28.59 kg/m as calculated from the following:   Height as of this encounter: _1  (1.727 m).   Weight as of this encounter: 188 lb (85.3 kg). Ideal: Ideal body weight: 68.4 kg (150 lb 12.7 oz) Adjusted ideal body weight: 75.1 kg (165 lb 10.8 oz)   Thoracic Spine Area Exam  Skin & Axial Inspection: Significant thoracic kyphosis Alignment: Asymmetric Functional ROM: Pain restricted ROM Stability: No instability detected Muscle Tone/Strength: Functionally intact. No obvious neuro-muscular anomalies detected. Sensory (Neurological): Neurogenic pain pattern Muscle strength & Tone: No palpable anomalies Lumbar Spine Area Exam  Skin & Axial Inspection: Lumbar Scoliosis Alignment: Asymmetric Functional ROM: Pain restricted ROM       Stability: No instability detected Muscle Tone/Strength: Functionally intact. No obvious neuro-muscular anomalies detected. Sensory (Neurological): Neurogenic pain pattern Palpation: Complains of area being tender to palpation        Gait &  Posture Assessment  Ambulation: Patient came in today in a wheel chair Gait: Significantly limited. Dependent on assistive device to ambulate Posture: Difficulty standing up straight, due to pain  Lower Extremity Exam    Side: Right lower extremity  Side: Left lower extremity  Stability: No instability observed          Stability: No instability observed          Skin & Extremity Inspection: Skin color, temperature, and hair growth are WNL. No peripheral edema or cyanosis. No masses, redness, swelling, asymmetry, or associated skin lesions. No contractures.  Skin & Extremity Inspection: Skin color, temperature, and hair growth are WNL. No peripheral edema or cyanosis. No masses, redness, swelling, asymmetry, or associated skin lesions. No contractures.  Functional ROM: Pain restricted ROM for hip and knee joints          Functional ROM: Pain restricted ROM for hip and knee joints          Muscle Tone/Strength: Functionally intact. No obvious neuro-muscular anomalies detected.  Muscle Tone/Strength: Functionally intact. No obvious neuro-muscular anomalies detected.  Sensory (Neurological): Dermatomal pain pattern        Sensory (Neurological): Dermatomal pain pattern        DTR: Patellar: deferred today Achilles: deferred today Plantar: deferred today  DTR: Patellar: deferred today Achilles: deferred today Plantar: deferred today  Palpation: No palpable anomalies  Palpation: No palpable anomalies    Assessment   Status Diagnosis  Controlled Controlled Controlled 1. Lumbar spondylosis   2. Osteoarthritis of shoulder (Bilateral) (L>R)   3. Chronic shoulder pain (Primary Area of Pain) (Bilateral) (L>R)   4. Chronic hip pain (Secondary area of Pain) (Bilateral) (R>L)   5. Chronic low back pain (Third area of Pain) (Bilateral) (R>L)   6. T12 compression fracture, sequela   7. Long term current use of opiate analgesic   8. Long-term use of high-risk medication   9. Chronic shoulder pain  (Left)   10. Chronic pain syndrome      Updated Problems: Problem  T12 Compression Fracture, Sequela  Chronic acromioclavicular joint pain (Left)  Osteoarthritis of shoulder (Bilateral) (L>R)  Chronic Pain Syndrome  Lumbar facet syndrome (Bilateral) (R>L)  Lumbar Spondylosis  Long Term Current Use of Opiate Analgesic  Long Term Prescription Opiate Use  Opioid Dependence, Daily Use ([place])  Chronic hip pain (Secondary area of Pain) (Bilateral) (R>L)  Chronic shoulder  pain (Primary Area of Pain) (Bilateral) (L>R)  Chronic low back pain (Third area of Pain) (Bilateral) (R>L)  S/P THR: total hip replacement (Bilateral)    Plan of Care  Aaron Short has a current medication list which includes the following long-term medication(s): digoxin, furosemide, metoprolol succinate, rivaroxaban, and narcan.   Keep appointment with [facility] to discuss vertebral augmentation for subacute T12 compression fracture.  Given acute on chronic pain secondary to T12 compression fracture, temporary increase in pain regimen below to oxycodone 10 mg 4 times daily as needed, quantity 120/month.  Informed patient that we will reduce dose in 2 months and that this is a temporary increase given his acute on chronic pain.  Patient endorsed understanding.  Pharmacotherapy (Medications Ordered): Meds ordered this encounter  Medications  . oxyCODONE-acetaminophen (PERCOCET) 10-325 MG tablet    Sig: Take 1 tablet by mouth every 6 (six) hours as needed for pain. Must last 30 days.    Dispense:  120 tablet    Refill:  0    Chronic Pain. (STOP Act - Not applicable). Fill one day early if closed on scheduled refill date.  Marland Kitchen oxyCODONE-acetaminophen (PERCOCET) 10-325 MG tablet    Sig: Take 1 tablet by mouth every 6 (six) hours as needed for pain. Must last 30 days.    Dispense:  120 tablet    Refill:  0    Chronic Pain. (STOP Act - Not applicable). Fill one day early if closed on scheduled refill date.    Follow-up plan:   Return in about 8 weeks (around 05/04/2020) for Medication Management, in person.    Recent Visits Date Type Provider Dept  02/05/20 Telemedicine Gillis Santa, MD [facility]  01/07/20 Office Visit Milinda Pointer, MD [facility]  Showing recent visits within past 90 days and meeting all other requirements Today's Visits Date Type Provider Dept  03/09/20 Office Visit Gillis Santa, MD [facility]  Showing today's visits and meeting all other requirements Future Appointments Date Type Provider Dept  04/29/20 Appointment Gillis Santa, MD [facility]  Showing future appointments within next 90 days and meeting all other requirements  I discussed the assessment and treatment plan with the patient. The patient was provided an opportunity to ask questions and all were answered. The patient agreed with the plan and demonstrated an understanding of the instructions.  Patient advised to call back or seek an in-person evaluation if the symptoms or condition worsens.  Duration of encounter: 72mnutes.  Note by: BGillis Santa MD Date: 03/09/2020; Time: 1:52 PM

## 2020-03-09 NOTE — Patient Instructions (Addendum)
1. Please call pharmacy to cancel Oxycodone 5 mg Rx for the next 2 months  2. New rx for Percocet at 10 mg QID prn for next 2 months  Oxycodone/APAP 10-325  to last until 05/08/20 has been escribed to your pharmacy.

## 2020-03-09 NOTE — Progress Notes (Signed)
Notified pharmacy (Meghan at [facility]) to cancel Oxycodone 5 Rx).

## 2020-03-25 ENCOUNTER — Ambulatory Visit: Payer: Medicare Other | Admitting: Student in an Organized Health Care Education/Training Program

## 2020-04-29 ENCOUNTER — Encounter: Payer: Medicare Other | Admitting: Student in an Organized Health Care Education/Training Program

## 2020-05-06 ENCOUNTER — Encounter: Payer: Medicare Other | Admitting: Student in an Organized Health Care Education/Training Program

## 2020-05-12 ENCOUNTER — Ambulatory Visit
Payer: Medicare Other | Attending: Student in an Organized Health Care Education/Training Program | Admitting: Student in an Organized Health Care Education/Training Program

## 2020-05-12 ENCOUNTER — Other Ambulatory Visit: Payer: Self-pay

## 2020-05-12 ENCOUNTER — Encounter: Payer: Self-pay | Admitting: Student in an Organized Health Care Education/Training Program

## 2020-05-12 VITALS — BP 129/67 | HR 74 | Temp 97.1°F | Resp 18 | Ht 68.0 in | Wt 192.0 lb

## 2020-05-12 DIAGNOSIS — M47816 Spondylosis without myelopathy or radiculopathy, lumbar region: Secondary | ICD-10-CM | POA: Insufficient documentation

## 2020-05-12 DIAGNOSIS — M25512 Pain in left shoulder: Secondary | ICD-10-CM | POA: Diagnosis present

## 2020-05-12 DIAGNOSIS — S22080S Wedge compression fracture of T11-T12 vertebra, sequela: Secondary | ICD-10-CM | POA: Insufficient documentation

## 2020-05-12 DIAGNOSIS — M25511 Pain in right shoulder: Secondary | ICD-10-CM | POA: Diagnosis not present

## 2020-05-12 DIAGNOSIS — G894 Chronic pain syndrome: Secondary | ICD-10-CM

## 2020-05-12 DIAGNOSIS — M545 Low back pain, unspecified: Secondary | ICD-10-CM | POA: Diagnosis present

## 2020-05-12 DIAGNOSIS — M19012 Primary osteoarthritis, left shoulder: Secondary | ICD-10-CM | POA: Insufficient documentation

## 2020-05-12 DIAGNOSIS — M25559 Pain in unspecified hip: Secondary | ICD-10-CM | POA: Insufficient documentation

## 2020-05-12 DIAGNOSIS — M19011 Primary osteoarthritis, right shoulder: Secondary | ICD-10-CM | POA: Diagnosis present

## 2020-05-12 DIAGNOSIS — G8929 Other chronic pain: Secondary | ICD-10-CM | POA: Insufficient documentation

## 2020-05-12 MED ORDER — OXYCODONE-ACETAMINOPHEN 10-325 MG PO TABS
1.0000 | ORAL_TABLET | Freq: Four times a day (QID) | ORAL | 0 refills | Status: DC | PRN
Start: 2020-07-11 — End: 2020-08-10

## 2020-05-12 MED ORDER — OXYCODONE-ACETAMINOPHEN 10-325 MG PO TABS
1.0000 | ORAL_TABLET | Freq: Four times a day (QID) | ORAL | 0 refills | Status: DC | PRN
Start: 2020-06-11 — End: 2020-08-10

## 2020-05-12 MED ORDER — OXYCODONE-ACETAMINOPHEN 10-325 MG PO TABS
1.0000 | ORAL_TABLET | Freq: Four times a day (QID) | ORAL | 0 refills | Status: DC | PRN
Start: 2020-05-12 — End: 2020-08-10

## 2020-05-12 NOTE — Progress Notes (Signed)
Nursing Pain Medication Assessment:  Safety precautions to be maintained throughout the outpatient stay will include: orient to surroundings, keep bed in low position, maintain call bell within reach at all times, provide assistance with transfer out of bed and ambulation.  Medication Inspection Compliance: Aaron Short did not comply with our request to bring his pills to be counted. He was reminded that bringing the medication bottles, even when empty, is a requirement.  Medication: None brought in. Pill/Patch Count: None available to be counted. Bottle Appearance: No container available. Did not bring bottle(s) to appointment. Filled Date: N/A Last Medication intake:  Day before yesterday   Pt was called to come in early today - came from grocery store and so does not have pill container. Pt. V/u to bring oxycodone container to each appt.

## 2020-05-12 NOTE — Progress Notes (Signed)
PROVIDER NOTE: Information contained herein reflects review and annotations entered in association with encounter. Interpretation of such information and data should be left to medically-trained personnel. Information provided to patient can be located elsewhere in the medical record under "Patient Instructions". Document created using STT-dictation technology, any transcriptional errors that may result from process are unintentional.    Patient: Aaron Short  Service Category: E/M  Provider: Gillis Santa, MD  DOB: [birth year]  DOS: 05/12/2020  Specialty: Interventional Pain Management  MRN: [id number]  Setting: Ambulatory outpatient  PCP: Aaron Dear, MD  Type: Established Patient    Referring Provider: Neomia Dear, MD  Location: Office  Delivery: Face-to-face     HPI  Aaron Short, a 74 y.o. year old male, is here today because of his Lumbar spondylosis [M47.816]. Aaron Short primary complain today is Back Pain (Lower, upper) and Shoulder Pain (Left shoulderblade) Last encounter: My last encounter with him was on 03/09/2020. Pertinent problems: Aaron Short has Long term current use of opiate analgesic; Long term prescription opiate use; Opioid dependence, daily use ([place]); Chronic hip pain (Secondary area of Pain) (Bilateral) (R>L); Chronic shoulder pain (Primary Area of Pain) (Bilateral) (L>R); Chronic low back pain (Third area of Pain) (Bilateral) (R>L); S/P THR: total hip replacement (Bilateral); Lumbar facet syndrome (Bilateral) (R>L); Lumbar spondylosis; Chronic pain syndrome; Chronic acromioclavicular joint pain (Left); Osteoarthritis of shoulder (Bilateral) (L>R); and T12 compression fracture, sequela on their pertinent problem list. Pain Assessment: Severity of Chronic pain is reported as a 1 /10. Location: Back Lower,Upper/denies. Onset: More than a month ago. Quality: Sharp. Timing: Constant. Modifying factor(s): meds. Vitals:  height is '5\' 8"'  (1.727 m)  and weight is 192 lb (87.1 kg). His temporal temperature is 97.1 F (36.2 C) (abnormal). His blood pressure is 129/67 and his pulse is 74. His respiration is 18 and oxygen saturation is 99%.   Reason for encounter: medication management.   Aaron Short presents today for medication management.  My last encounter with the patient was March 09, 2020.  He was having significant mid back pain as a result of his compression fracture.  His Percocet was adjusted to 10 mg 4 times daily as needed which is helping to manage his pain.  He states that he is in a better place and that he is more functional given the adjustments to his pain medications.  He has also endorsing pain relief for his left shoulder which she is very surprised with.  He states that he has always had left shoulder pain that has not responded to anything in the past.  I am pleased to hear that he is doing better and that his pain is well managed on his current regimen.  No noted side effects of constipation, imbalance, cognitive dysfunction, itching, nausea.  I expressed my concerns regarding him being on soma is Percocet however his Manuela Neptune is a long-term medication that I am not prescribing.  I informed him to not take his Soma within 3 hours with his Percocet.  Patient endorsed understanding.  Pharmacotherapy Assessment   Analgesic:  Percocet 10 mg 4 times daily as needed, quantity 120/month given subacute compression fractures resulting in acute on chronic pain.     Monitoring: [place] PMP: PDMP reviewed during this encounter.       Pharmacotherapy: No side-effects or adverse reactions reported. Compliance: No problems identified. Effectiveness: Clinically acceptable.  Aaron Short, Aaron Short  05/12/2020 12:58 PM  Sign when Signing Visit Nursing Pain Medication Assessment:  Safety precautions to be maintained  throughout the outpatient stay will include: orient to surroundings, keep bed in low position, maintain call bell within reach at all  times, provide assistance with transfer out of bed and ambulation.  Medication Inspection Compliance: Aaron Short did not comply with our request to bring his pills to be counted. He was reminded that bringing the medication bottles, even when empty, is a requirement.  Medication: None brought in. Pill/Patch Count: None available to be counted. Bottle Appearance: No container available. Did not bring bottle(s) to appointment. Filled Date: N/A Last Medication intake:  Day before yesterday   Pt was called to come in early today - came from grocery store and so does not have pill container. Pt. V/u to bring oxycodone container to each appt.    UDS:  Summary  Date Value Ref Range Status  01/07/2020 Note  Final    Comment:    ==================================================================== [facility] (MW) ==================================================================== Test                             Result       Flag       Units  Drug Present and Declared for Prescription Verification   Oxycodone                      2615         EXPECTED   ng/mg creat   Oxymorphone                    2239         EXPECTED   ng/mg creat   Noroxycodone                   >4049        EXPECTED   ng/mg creat   Noroxymorphone                 1409         EXPECTED   ng/mg creat    Sources of oxycodone are scheduled prescription medications.    Oxymorphone, noroxycodone, and noroxymorphone are expected    metabolites of oxycodone. Oxymorphone is also available as a    scheduled prescription medication.  ==================================================================== Test                      Result    Flag   Units      Ref Range   Creatinine              247              mg/dL      >=20 ==================================================================== Declared Medications:  The flagging and interpretation on this report are based on the  following declared medications.   Unexpected results may arise from  inaccuracies in the declared medications.   **Note: The testing scope of this panel includes these medications:   Oxycodone   **Note: The testing scope of this panel does not include the  following reported medications:   Acetaminophen (Tylenol)  Carisoprodol (Soma)  Digoxin (Lanoxin)  Furosemide (Lasix)  Magnesium  Metoprolol (Toprol)  Naloxone (Narcan)  Rivaroxaban (Xarelto)  Rosuvastatin (Crestor)  Triamcinolone (Kenalog) ==================================================================== For clinical consultation, please call [phone]. ====================================================================      ROS  Constitutional: Denies any fever or chills Gastrointestinal: No reported hemesis, hematochezia, vomiting, or acute GI distress Musculoskeletal: Denies any acute onset joint swelling, redness, loss  of ROM, or weakness Neurological: No reported episodes of acute onset apraxia, aphasia, dysarthria, agnosia, amnesia, paralysis, loss of coordination, or loss of consciousness  Medication Review  Magnesium, acetaminophen, carisoprodol, digoxin, furosemide, metoprolol succinate, naloxone, oxyCODONE-acetaminophen, rivaroxaban, and rosuvastatin  History Review  Allergy: Aaron Short is allergic to diltiazem, penicillins, and tizanidine. Drug: Aaron Short  reports no history of drug use. Alcohol:  reports no history of alcohol use. Tobacco:  reports that he has been smoking cigarettes. He has been smoking about 0.50 packs per day. He has never used smokeless tobacco. Social: Aaron Short  reports that he has been smoking cigarettes. He has been smoking about 0.50 packs per day. He has never used smokeless tobacco. He reports that he does not drink alcohol and does not use drugs. Medical:  has a past medical history of Arthralgia of lower leg (04/02/2012), Arthralgia of upper arm (06/18/2009), Cancer ([place]), Diabetes  mellitus without complication ([place]), FH: atrial fibrillation, and Hypertension. Surgical: Aaron Short  has a past surgical history that includes bladder cancer surgery (2015); Joint replacement; Replacement total hip w/  resurfacing implants (Bilateral); Total knee arthroplasty (Bilateral); and Shoulder surgery (Bilateral). Family: family history includes Dementia in his mother; Heart disease in his father.  Laboratory Chemistry Profile   Renal Lab Results  Component Value Date   BUN 11 02/10/2019   CREATININE 0.85 02/10/2019   BCR 13 02/10/2019   GFRAA 99 02/10/2019   GFRNONAA 85 02/10/2019     Hepatic Lab Results  Component Value Date   AST 15 02/10/2019   ALT 17 11/18/2015   ALBUMIN 4.0 02/10/2019   ALKPHOS 89 02/10/2019     Electrolytes Lab Results  Component Value Date   NA 141 02/10/2019   K 4.9 02/10/2019   CL 102 02/10/2019   CALCIUM 9.4 02/10/2019   MG 1.9 02/10/2019     Bone Lab Results  Component Value Date   25OHVITD1 32 02/10/2019   25OHVITD2 <1.0 02/10/2019   25OHVITD3 32 02/10/2019     Inflammation (CRP: Acute Phase) (ESR: Chronic Phase) Lab Results  Component Value Date   CRP 7 02/10/2019   ESRSEDRATE 21 02/10/2019       Note: Above Lab results reviewed.  Recent Imaging Review  DG Lumbar Spine Complete W/Bend CLINICAL DATA:  Low back pain for 1 month radiating down LEFT leg, injured back lifting something heavy, lumbar spondylosis, lumbar facet syndrome  EXAM: LUMBAR SPINE - COMPLETE WITH BENDING VIEWS  COMPARISON:  11/18/2015  FINDINGS: Osseous demineralization.  Five non-rib-bearing lumbar vertebra.  Multilevel endplate spur formation throughout lumbar spine.  Minimal disc space narrowing L4-L5.  Vertebral body heights maintained without fracture or bone destruction.  Minimal retrolisthesis at L5-S1 approximately 3 mm which increases to 6 mm with extension and appears unchanged with flexion.  Remaining alignments  normal it out abnormal motion with flexion or extension.  SI joints preserved.  BILATERAL hip prostheses.  IMPRESSION: Scattered degenerative disc disease changes of the lumbar spine.  Minimal retrolisthesis at L5-S1 which mildly increases with extension and is unchanged with flexion.  Electronically Signed   By: Lavonia Dana M.D.   On: 11/08/2017 11:02 Note: Reviewed        Physical Exam  General appearance: Well nourished, well developed, and well hydrated. In no apparent acute distress Mental status: Alert, oriented x 3 (person, place, & time)       Respiratory: No evidence of acute respiratory distress Eyes: PERLA Vitals: BP 129/67   Pulse 74  Temp (!) 97.1 F (36.2 C) (Temporal)   Resp 18   Ht '5\' 8"'  (1.727 m)   Wt 192 lb (87.1 kg)   SpO2 99%   BMI 29.19 kg/m  BMI: Estimated body mass index is 29.19 kg/m as calculated from the following:   Height as of this encounter: '5\' 8"'  (1.727 m).   Weight as of this encounter: 192 lb (87.1 kg). Ideal: Ideal body weight: 68.4 kg (150 lb 12.7 oz) Adjusted ideal body weight: 75.9 kg (167 lb 4.4 oz)   Thoracic Spine Area Exam  Skin & Axial Inspection: Significant thoracic kyphosis Alignment: Asymmetric Functional ROM: Pain restricted ROM Stability: No instability detected Muscle Tone/Strength: Functionally intact. No obvious neuro-muscular anomalies detected. Sensory (Neurological): Neurogenic pain pattern Muscle strength & Tone: No palpable anomalies Lumbar Spine Area Exam  Skin & Axial Inspection: Lumbar Scoliosis Alignment: Asymmetric Functional ROM: Pain restricted ROM       Stability: No instability detected Muscle Tone/Strength: Functionally intact. No obvious neuro-muscular anomalies detected. Sensory (Neurological): Neurogenic pain pattern Palpation: Complains of area being tender to palpation        Gait & Posture Assessment  Ambulation: Patient came in today in a wheel chair Gait: Significantly limited.  Dependent on assistive device to ambulate Posture: Difficulty standing up straight, due to pain  Lower Extremity Exam    Side: Right lower extremity  Side: Left lower extremity  Stability: No instability observed          Stability: No instability observed          Skin & Extremity Inspection: Skin color, temperature, and hair growth are WNL. No peripheral edema or cyanosis. No masses, redness, swelling, asymmetry, or associated skin lesions. No contractures.  Skin & Extremity Inspection: Skin color, temperature, and hair growth are WNL. No peripheral edema or cyanosis. No masses, redness, swelling, asymmetry, or associated skin lesions. No contractures.  Functional ROM: Pain restricted ROM for hip and knee joints          Functional ROM: Pain restricted ROM for hip and knee joints          Muscle Tone/Strength: Functionally intact. No obvious neuro-muscular anomalies detected.  Muscle Tone/Strength: Functionally intact. No obvious neuro-muscular anomalies detected.  Sensory (Neurological): Dermatomal pain pattern        Sensory (Neurological): Dermatomal pain pattern        DTR: Patellar: deferred today Achilles: deferred today Plantar: deferred today  DTR: Patellar: deferred today Achilles: deferred today Plantar: deferred today  Palpation: No palpable anomalies  Palpation: No palpable anomalies     Assessment   Status Diagnosis  Controlled Controlled Controlled 1. Lumbar spondylosis   2. Osteoarthritis of shoulder (Bilateral) (L>R)   3. Chronic shoulder pain (Primary Area of Pain) (Bilateral) (L>R)   4. Chronic hip pain (Secondary area of Pain) (Bilateral) (R>L)   5. Chronic low back pain (Third area of Pain) (Bilateral) (R>L)   6. T12 compression fracture, sequela   7. Chronic pain syndrome       Plan of Care   Aaron Short has a current medication list which includes the following long-term medication(s): digoxin, furosemide, metoprolol succinate,  narcan, oxycodone-acetaminophen, rivaroxaban, [START ON 06/11/2020] oxycodone-acetaminophen, and [START ON 07/11/2020] oxycodone-acetaminophen.  Pharmacotherapy (Medications Ordered): Meds ordered this encounter  Medications  . oxyCODONE-acetaminophen (PERCOCET) 10-325 MG tablet    Sig: Take 1 tablet by mouth every 6 (six) hours as needed for pain.    Dispense:  120 tablet    Refill:  0    For chronic pain syndrome  . oxyCODONE-acetaminophen (PERCOCET) 10-325 MG tablet    Sig: Take 1 tablet by mouth every 6 (six) hours as needed for pain.    Dispense:  120 tablet    Refill:  0    For chronic pain syndrome  . oxyCODONE-acetaminophen (PERCOCET) 10-325 MG tablet    Sig: Take 1 tablet by mouth every 6 (six) hours as needed for pain.    Dispense:  120 tablet    Refill:  0    For chronic pain syndrome   Follow-up plan:   Return in about 3 months (around 08/10/2020) for Medication Management, in person.   Recent Visits Date Type Provider Dept  03/09/20 Office Visit Aaron Santa, MD [facility]  Showing recent visits within past 90 days and meeting all other requirements Today's Visits Date Type Provider Dept  05/12/20 Office Visit Aaron Santa, MD [facility]  Showing today's visits and meeting all other requirements Future Appointments Date Type Provider Dept  08/10/20 Appointment Aaron Santa, MD [facility]  Showing future appointments within next 90 days and meeting all other requirements  I discussed the assessment and treatment plan with the patient. The patient was provided an opportunity to ask questions and all were answered. The patient agreed with the plan and demonstrated an understanding of the instructions.  Patient advised to call back or seek an in-person evaluation if the symptoms or condition worsens.  Duration of encounter: 14mnutes.  Note by: BGillis Santa MD Date: 05/12/2020; Time: 1:50 PM

## 2020-08-10 ENCOUNTER — Other Ambulatory Visit: Payer: Self-pay

## 2020-08-10 ENCOUNTER — Ambulatory Visit
Payer: Medicare Other | Attending: Student in an Organized Health Care Education/Training Program | Admitting: Student in an Organized Health Care Education/Training Program

## 2020-08-10 ENCOUNTER — Encounter: Payer: Self-pay | Admitting: Student in an Organized Health Care Education/Training Program

## 2020-08-10 VITALS — BP 136/67 | HR 56 | Temp 97.0°F | Resp 20 | Ht 68.0 in | Wt 190.0 lb

## 2020-08-10 DIAGNOSIS — M19011 Primary osteoarthritis, right shoulder: Secondary | ICD-10-CM | POA: Insufficient documentation

## 2020-08-10 DIAGNOSIS — M25559 Pain in unspecified hip: Secondary | ICD-10-CM | POA: Diagnosis present

## 2020-08-10 DIAGNOSIS — M545 Low back pain, unspecified: Secondary | ICD-10-CM | POA: Insufficient documentation

## 2020-08-10 DIAGNOSIS — M19012 Primary osteoarthritis, left shoulder: Secondary | ICD-10-CM | POA: Diagnosis present

## 2020-08-10 DIAGNOSIS — M25511 Pain in right shoulder: Secondary | ICD-10-CM | POA: Diagnosis not present

## 2020-08-10 DIAGNOSIS — M25512 Pain in left shoulder: Secondary | ICD-10-CM | POA: Insufficient documentation

## 2020-08-10 DIAGNOSIS — Z79891 Long term (current) use of opiate analgesic: Secondary | ICD-10-CM | POA: Insufficient documentation

## 2020-08-10 DIAGNOSIS — G8929 Other chronic pain: Secondary | ICD-10-CM | POA: Diagnosis present

## 2020-08-10 DIAGNOSIS — M47816 Spondylosis without myelopathy or radiculopathy, lumbar region: Secondary | ICD-10-CM | POA: Insufficient documentation

## 2020-08-10 DIAGNOSIS — S22080S Wedge compression fracture of T11-T12 vertebra, sequela: Secondary | ICD-10-CM | POA: Insufficient documentation

## 2020-08-10 DIAGNOSIS — G894 Chronic pain syndrome: Secondary | ICD-10-CM | POA: Insufficient documentation

## 2020-08-10 MED ORDER — OXYCODONE-ACETAMINOPHEN 10-325 MG PO TABS
1.0000 | ORAL_TABLET | Freq: Four times a day (QID) | ORAL | 0 refills | Status: DC | PRN
Start: 2020-10-09 — End: 2020-11-04

## 2020-08-10 MED ORDER — OXYCODONE-ACETAMINOPHEN 10-325 MG PO TABS
1.0000 | ORAL_TABLET | Freq: Four times a day (QID) | ORAL | 0 refills | Status: DC | PRN
Start: 2020-08-10 — End: 2020-11-04

## 2020-08-10 MED ORDER — OXYCODONE-ACETAMINOPHEN 10-325 MG PO TABS
1.0000 | ORAL_TABLET | Freq: Four times a day (QID) | ORAL | 0 refills | Status: DC | PRN
Start: 2020-09-09 — End: 2020-11-04

## 2020-08-10 NOTE — Progress Notes (Signed)
Nursing Pain Medication Assessment:  Safety precautions to be maintained throughout the outpatient stay will include: orient to surroundings, keep bed in low position, maintain call bell within reach at all times, provide assistance with transfer out of bed and ambulation.  Medication Inspection Compliance: Pill count conducted under aseptic conditions, in front of the patient. Neither the pills nor the bottle was removed from the patient's sight at any time. Once count was completed pills were immediately returned to the patient in their original bottle.  Medication: Oxycodone/APAP Pill/Patch Count: 0 of 120 pills remain Pill/Patch Appearance: Markings consistent with prescribed medication Bottle Appearance: Standard pharmacy container. Clearly labeled. Filled Date: 02 / 13 / 2022 Last Medication intake:  Yesterday

## 2020-08-10 NOTE — Progress Notes (Signed)
PROVIDER NOTE: Information contained herein reflects review and annotations entered in association with encounter. Interpretation of such information and data should be left to medically-trained personnel. Information provided to patient can be located elsewhere in the medical record under "Patient Instructions". Document created using STT-dictation technology, any transcriptional errors that may result from process are unintentional.    Patient: Aaron Short  Service Category: E/M  Provider: Gillis Santa, MD  DOB: 1945/10/05  DOS: 08/10/2020  Specialty: Interventional Pain Management  MRN: [id number]  Setting: Ambulatory outpatient  PCP: Aaron Dear, MD  Type: Established Patient    Referring Provider: Neomia Dear, MD  Location: Office  Delivery: Face-to-face     HPI  [name], a 75 y.o. year old male, is here today because of his Primary osteoarthritis of shoulders, bilateral [M19.011, M19.012]. Mr. Bart primary complain today is Shoulder Pain, Knee Pain, and Back Pain Last encounter: My last encounter with him was on 05/12/2020. Pertinent problems: [name] has Long term current use of opiate analgesic; Long term prescription opiate use; Opioid dependence, daily use (Aaron Short); Chronic hip pain (Secondary area of Pain) (Bilateral) (R>L); Chronic shoulder pain (Primary Area of Pain) (Bilateral) (L>R); Chronic low back pain (Third area of Pain) (Bilateral) (R>L); S/P THR: total hip replacement (Bilateral); Lumbar facet syndrome (Bilateral) (R>L); Lumbar spondylosis; Chronic pain syndrome; Chronic acromioclavicular joint pain (Left); Osteoarthritis of shoulder (Bilateral) (L>R); and T12 compression fracture, sequela on their pertinent problem list. Pain Assessment: Severity of Chronic pain is reported as a 2 /10. Location: Shoulder Right,Left/radiates down arms. Onset: More than a month ago. Quality: Sharp,Aching. Timing: Intermittent. Modifying factor(s):  rest. Vitals:  height is '5\' 8"'  (1.727 m) and weight is 190 lb (86.2 kg). His temperature is 97 F (36.1 C) (abnormal). His blood pressure is 136/67 and his pulse is 56 (abnormal). His respiration is 20 and oxygen saturation is 98%.   Reason for encounter: medication management.    No change in medical history since last visit.  Patient's pain is at baseline.  Patient continues multimodal pain regimen as prescribed.  States that it provides pain relief and improvement in functional status. Commended patient on weight loss.  He states that he has transition to more of a protein-based diet.  Pharmacotherapy Assessment   Analgesic:  Percocet 10 mg 4 times daily as needed, quantity 120/month given subacute compression fractures resulting in acute on chronic pain.     Monitoring: [place] PMP: PDMP reviewed during this encounter.       Pharmacotherapy: No side-effects or adverse reactions reported. Compliance: No problems identified. Effectiveness: Clinically acceptable.  Aaron Shorter, RN  08/10/2020 10:35 AM  Signed Nursing Pain Medication Assessment:  Safety precautions to be maintained throughout the outpatient stay will include: orient to surroundings, keep bed in low position, maintain call bell within reach at all times, provide assistance with transfer out of bed and ambulation.  Medication Inspection Compliance: Pill count conducted under aseptic conditions, in front of the patient. Neither the pills nor the bottle was removed from the patient's sight at any time. Once count was completed pills were immediately returned to the patient in their original bottle.  Medication: Oxycodone/APAP Pill/Patch Count: 0 of 120 pills remain Pill/Patch Appearance: Markings consistent with prescribed medication Bottle Appearance: Standard pharmacy container. Clearly labeled. Filled Date: 02 / 13 / 2022 Last Medication intake:  Yesterday    UDS:  Summary  Date Value Ref Range Status  01/07/2020 Note   Final    Comment:    ====================================================================  [employer] [facility] 13 (MW) ==================================================================== Test                             Result       Flag       Units  Drug Present and Declared for Prescription Verification   Oxycodone                      2615         EXPECTED   ng/mg creat   Oxymorphone                    2239         EXPECTED   ng/mg creat   Noroxycodone                   >4049        EXPECTED   ng/mg creat   Noroxymorphone                 1409         EXPECTED   ng/mg creat    Sources of oxycodone are scheduled prescription medications.    Oxymorphone, noroxycodone, and noroxymorphone are expected    metabolites of oxycodone. Oxymorphone is also available as a    scheduled prescription medication.  ==================================================================== Test                      Result    Flag   Units      Ref Range   Creatinine              247              mg/dL      >=20 ==================================================================== Declared Medications:  The flagging and interpretation on this report are based on the  following declared medications.  Unexpected results may arise from  inaccuracies in the declared medications.   **Note: The testing scope of this panel includes these medications:   Oxycodone   **Note: The testing scope of this panel does not include the  following reported medications:   Acetaminophen (Tylenol)  Carisoprodol (Soma)  Digoxin (Lanoxin)  Furosemide (Lasix)  Magnesium  Metoprolol (Toprol)  Naloxone (Narcan)  Rivaroxaban (Xarelto)  Rosuvastatin (Crestor)  Triamcinolone (Kenalog) ==================================================================== For clinical consultation, please call [phone]. ====================================================================      ROS  Constitutional: Denies any fever or  chills Gastrointestinal: No reported hemesis, hematochezia, vomiting, or acute GI distress Musculoskeletal: thoracic and lumbar back pain Neurological: No reported episodes of acute onset apraxia, aphasia, dysarthria, agnosia, amnesia, paralysis, loss of coordination, or loss of consciousness  Medication Review  DULoxetine, Magnesium, acetaminophen, carisoprodol, digoxin, furosemide, metoprolol succinate, naloxone, oxyCODONE-acetaminophen, rivaroxaban, and rosuvastatin  History Review  Allergy: [name] is allergic to diltiazem, penicillins, and tizanidine. Drug: [name]  reports no history of drug use. Alcohol:  reports no history of alcohol use. Tobacco:  reports that he has been smoking cigarettes. He has been smoking about 0.50 packs per day. He has never used smokeless tobacco. Social: [name]  reports that he has been smoking cigarettes. He has been smoking about 0.50 packs per day. He has never used smokeless tobacco. He reports that he does not drink alcohol and does not use drugs. Medical:  has a past medical history of Arthralgia of lower leg (04/02/2012), Arthralgia of upper arm (06/18/2009), Cancer ([place]), Diabetes mellitus without complication ([place]),  FH: atrial fibrillation, and Hypertension. Surgical: [name]  has a past surgical history that includes bladder cancer surgery (2015); Joint replacement; Replacement total hip w/  resurfacing implants (Bilateral); Total knee arthroplasty (Bilateral); and Shoulder surgery (Bilateral). Family: family history includes Dementia in his mother; Heart disease in his father.  Laboratory Chemistry Profile   Renal Lab Results  Component Value Date   BUN 11 02/10/2019   CREATININE 0.85 02/10/2019   BCR 13 02/10/2019   GFRAA 99 02/10/2019   GFRNONAA 85 02/10/2019     Hepatic Lab Results  Component Value Date   AST 15 02/10/2019   ALT 17 11/18/2015   ALBUMIN 4.0 02/10/2019   ALKPHOS 89 02/10/2019      Electrolytes Lab Results  Component Value Date   NA 141 02/10/2019   K 4.9 02/10/2019   CL 102 02/10/2019   CALCIUM 9.4 02/10/2019   MG 1.9 02/10/2019     Bone Lab Results  Component Value Date   25OHVITD1 32 02/10/2019   25OHVITD2 <1.0 02/10/2019   25OHVITD3 32 02/10/2019     Inflammation (CRP: Acute Phase) (ESR: Chronic Phase) Lab Results  Component Value Date   CRP 7 02/10/2019   ESRSEDRATE 21 02/10/2019       Note: Above Lab results reviewed.  Recent Imaging Review  DG Lumbar Spine Complete W/Bend CLINICAL DATA:  Low back pain for 1 month radiating down LEFT leg, injured back lifting something heavy, lumbar spondylosis, lumbar facet syndrome  EXAM: LUMBAR SPINE - COMPLETE WITH BENDING VIEWS  COMPARISON:  11/18/2015  FINDINGS: Osseous demineralization.  Five non-rib-bearing lumbar vertebra.  Multilevel endplate spur formation throughout lumbar spine.  Minimal disc space narrowing L4-L5.  Vertebral body heights maintained without fracture or bone destruction.  Minimal retrolisthesis at L5-S1 approximately 3 mm which increases to 6 mm with extension and appears unchanged with flexion.  Remaining alignments normal it out abnormal motion with flexion or extension.  SI joints preserved.  BILATERAL hip prostheses.  IMPRESSION: Scattered degenerative disc disease changes of the lumbar spine.  Minimal retrolisthesis at L5-S1 which mildly increases with extension and is unchanged with flexion.  Electronically Signed   By: Lavonia Dana M.D.   On: 11/08/2017 11:02 Note: Reviewed        Physical Exam  General appearance: Well nourished, well developed, and well hydrated. In no apparent acute distress Mental status: Alert, oriented x 3 (person, place, & time)       Respiratory: No evidence of acute respiratory distress Eyes: PERLA Vitals: BP 136/67   Pulse (!) 56   Temp (!) 97 F (36.1 C)   Resp 20   Ht '5\' 8"'  (1.727 m)   Wt 190 lb (86.2 kg)    SpO2 98%   BMI 28.89 kg/m  BMI: Estimated body mass index is 28.89 kg/m as calculated from the following:   Height as of this encounter: '5\' 8"'  (1.727 m).   Weight as of this encounter: 190 lb (86.2 kg). Ideal: Ideal body weight: 68.4 kg (150 lb 12.7 oz) Adjusted ideal body weight: 75.5 kg (166 lb 7.6 oz)  Thoracic Spine Area Exam  Skin & Axial Inspection:Significant thoracic kyphosis Alignment:Asymmetric Functional ZOX:WRUE restricted ROM Stability:No instability detected Muscle Tone/Strength:Functionally intact. No obvious neuro-muscular anomalies detected. Sensory (Neurological):Neurogenic pain pattern Muscle strength & Tone:No palpable anomalies Lumbar Spine Area Exam  Skin & Axial Inspection:Lumbar Scoliosis Alignment:Asymmetric Functional AVW:UJWJ restricted ROM Stability:No instability detected Muscle Tone/Strength:Functionally intact. No obvious neuro-muscular anomalies detected. Sensory (Neurological):Neurogenic pain pattern  Palpation:Complains of area being tender to palpation  Gait & Posture Assessment  Ambulation:Patient came in today in a wheel chair Gait:Significantly limited. Dependent on assistive device to ambulate Posture:Difficulty standing up straight, due to pain Lower Extremity Exam    Side:Right lower extremity  Side:Left lower extremity  Stability:No instability observed  Stability:No instability observed  Skin & Extremity Inspection:Skin color, temperature, and hair growth are WNL. No peripheral edema or cyanosis. No masses, redness, swelling, asymmetry, or associated skin lesions. No contractures.  Skin & Extremity Inspection:Skin color, temperature, and hair growth are WNL. No peripheral edema or cyanosis. No masses, redness, swelling, asymmetry, or associated skin lesions. No contractures.  Functional ERX:VQMG restricted ROMfor hip and knee joints   Functional QQP:YPPJ restricted  ROMfor hip and knee joints   Muscle Tone/Strength:Functionally intact. No obvious neuro-muscular anomalies detected.  Muscle Tone/Strength:Functionally intact. No obvious neuro-muscular anomalies detected.  Sensory (Neurological):Dermatomal pain pattern  Sensory (Neurological):Dermatomal pain pattern  DTR: Patellar:deferred today Achilles:deferred today Plantar:deferred today  DTR: Patellar:deferred today Achilles:deferred today Plantar:deferred today  Palpation:No palpable anomalies  Palpation:No palpable anomalies    Assessment   Status Diagnosis  Controlled Controlled Controlled 1. Osteoarthritis of shoulder (Bilateral) (L>R)   2. Chronic shoulder pain (Primary Area of Pain) (Bilateral) (L>R)   3. Chronic hip pain (Secondary area of Pain) (Bilateral) (R>L)   4. Chronic low back pain (Third area of Pain) (Bilateral) (R>L)   5. T12 compression fracture, sequela   6. Chronic pain syndrome   7. Long term current use of opiate analgesic   8. Chronic shoulder pain (Left)   9. Lumbar spondylosis       Plan of Care   Aaron Short has a current medication list which includes the following long-term medication(s): digoxin, duloxetine, furosemide, metoprolol succinate, narcan, oxycodone-acetaminophen, [START ON 09/09/2020] oxycodone-acetaminophen, [START ON 10/09/2020] oxycodone-acetaminophen, and rivaroxaban.  Continue with home physical therapy exercises and diet modification.  Pharmacotherapy (Medications Ordered): Meds ordered this encounter  Medications  . oxyCODONE-acetaminophen (PERCOCET) 10-325 MG tablet    Sig: Take 1 tablet by mouth every 6 (six) hours as needed for pain.    Dispense:  120 tablet    Refill:  0    For chronic pain syndrome  . oxyCODONE-acetaminophen (PERCOCET) 10-325 MG tablet    Sig: Take 1 tablet by mouth every 6 (six) hours as needed for pain.    Dispense:  120 tablet    Refill:  0    For chronic pain  syndrome  . oxyCODONE-acetaminophen (PERCOCET) 10-325 MG tablet    Sig: Take 1 tablet by mouth every 6 (six) hours as needed for pain.    Dispense:  120 tablet    Refill:  0    For chronic pain syndrome   Follow-up plan:   Return in about 3 months (around 11/10/2020) for Medication Management, in person.   Recent Visits Date Type Provider Dept  05/12/20 Office Visit Aaron Santa, MD [facility]  Showing recent visits within past 90 days and meeting all other requirements Today's Visits Date Type Provider Dept  08/10/20 Office Visit Aaron Santa, MD [facility]  Showing today's visits and meeting all other requirements Future Appointments Date Type Provider Dept  11/04/20 Appointment Aaron Santa, MD [facility]  Showing future appointments within next 90 days and meeting all other requirements  I discussed the assessment and treatment plan with the patient. The patient was provided an opportunity to ask questions and all were answered. The patient agreed  with the plan and demonstrated an understanding of the instructions.  Patient advised to call back or seek an in-person evaluation if the symptoms or condition worsens.  Duration of encounter: 30 minutes.  Note by: Aaron Santa, MD Date: 08/10/2020; Time: 11:06 AM

## 2020-11-04 ENCOUNTER — Ambulatory Visit
Payer: Medicare Other | Attending: Student in an Organized Health Care Education/Training Program | Admitting: Student in an Organized Health Care Education/Training Program

## 2020-11-04 ENCOUNTER — Other Ambulatory Visit: Payer: Self-pay

## 2020-11-04 ENCOUNTER — Encounter: Payer: Self-pay | Admitting: Student in an Organized Health Care Education/Training Program

## 2020-11-04 VITALS — BP 122/68 | HR 50 | Temp 97.0°F | Resp 18 | Ht 68.0 in | Wt 178.0 lb

## 2020-11-04 DIAGNOSIS — Z79891 Long term (current) use of opiate analgesic: Secondary | ICD-10-CM | POA: Diagnosis present

## 2020-11-04 DIAGNOSIS — M25511 Pain in right shoulder: Secondary | ICD-10-CM | POA: Insufficient documentation

## 2020-11-04 DIAGNOSIS — G894 Chronic pain syndrome: Secondary | ICD-10-CM | POA: Diagnosis present

## 2020-11-04 DIAGNOSIS — M19011 Primary osteoarthritis, right shoulder: Secondary | ICD-10-CM | POA: Diagnosis present

## 2020-11-04 DIAGNOSIS — M545 Low back pain, unspecified: Secondary | ICD-10-CM | POA: Insufficient documentation

## 2020-11-04 DIAGNOSIS — M19012 Primary osteoarthritis, left shoulder: Secondary | ICD-10-CM | POA: Insufficient documentation

## 2020-11-04 DIAGNOSIS — M25559 Pain in unspecified hip: Secondary | ICD-10-CM | POA: Insufficient documentation

## 2020-11-04 DIAGNOSIS — M25512 Pain in left shoulder: Secondary | ICD-10-CM

## 2020-11-04 DIAGNOSIS — S22080S Wedge compression fracture of T11-T12 vertebra, sequela: Secondary | ICD-10-CM | POA: Diagnosis present

## 2020-11-04 DIAGNOSIS — G8929 Other chronic pain: Secondary | ICD-10-CM | POA: Diagnosis present

## 2020-11-04 MED ORDER — OXYCODONE-ACETAMINOPHEN 10-325 MG PO TABS: 1.0000 | ORAL_TABLET | Freq: Four times a day (QID) | ORAL | 0 refills | Status: DC | PRN

## 2020-11-04 NOTE — Progress Notes (Signed)
Nursing Pain Medication Assessment:  Safety precautions to be maintained throughout the outpatient stay will include: orient to surroundings, keep bed in low position, maintain call bell within reach at all times, provide assistance with transfer out of bed and ambulation.  Medication Inspection Compliance: Pill count conducted under aseptic conditions, in front of the patient. Neither the pills nor the bottle was removed from the patient's sight at any time. Once count was completed pills were immediately returned to the patient in their original bottle.  Medication: Oxycodone/APAP Pill/Patch Count:  0 of 120 pills remain Pill/Patch Appearance: Markings consistent with prescribed medication Bottle Appearance: Standard pharmacy container. Clearly labeled. Filled Date: 05 14 / 2022 Last Medication intake:  Today

## 2020-11-04 NOTE — Progress Notes (Signed)
Patient: Aaron Short  Service Category: E/M  Provider: Gillis Santa, MD  DOB: 1946-01-02  DOS: 11/04/2020  Specialty: Interventional Pain Management  MRN: [id number]  Setting: Ambulatory outpatient  PCP: Aaron Dear, MD  Type: Established Patient    Referring Provider: Neomia Dear, MD  Location: Office  Delivery: Face-to-face     HPI  Aaron Short, a 75 y.o. year old male, is here today because of his Primary osteoarthritis of shoulders, bilateral [M19.011, M19.012]. Aaron Short primary complain today is Shoulder Pain (left) Last encounter: My last encounter with him was on 05/12/2020. Pertinent problems: Aaron Short has Long term current use of opiate analgesic; Long term prescription opiate use; Opioid dependence, daily use ([place]); Chronic hip pain (Secondary area of Pain) (Bilateral) (R>L); Chronic shoulder pain (Primary Area of Pain) (Bilateral) (L>R); Chronic low back pain (Third area of Pain) (Bilateral) (R>L); S/P THR: total hip replacement (Bilateral); Lumbar facet syndrome (Bilateral) (R>L); Lumbar spondylosis; Chronic pain syndrome; Chronic acromioclavicular joint pain (Left); Osteoarthritis of shoulder (Bilateral) (L>R); and T12 compression fracture, sequela on their pertinent problem list. Pain Assessment: Severity of Chronic pain is reported as a 1 /10. Location: Shoulder Left/raditaes to fingers. Onset: More than a month ago. Quality: Hervey Ard, Aching. Timing: Constant. Modifying factor(s): heat, medicine. Vitals:  height is _0  (1.727 m) and weight is 178 lb (80.7 kg). His temperature is 97 F (36.1 C) (abnormal). His blood pressure is 122/68 and his pulse is 50 (abnormal). His respiration is 18 and oxygen saturation is 98%.   Reason for encounter: medication management.    No change in medical history since last visit.  Patient's pain is at baseline.  Patient continues multimodal pain regimen as prescribed.  States that it provides pain relief  and improvement in functional status. Patient states that he was in [place] for a family reunion and visiting his son.  He accidentally left his pain medication there and has been out of his Percocet. We discussed pain policy and I informed him that this should not happen again.  This was his one-time warning.  I will refill today so that the patient does not go into opioid withdrawal He was started on Cymbalta by his primary care provider for PTSD related to his time in [place].  Pharmacotherapy Assessment   Analgesic:  Percocet 10 mg 4 times daily as needed, quantity 120/month      Monitoring: [place] PMP: PDMP reviewed during this encounter.       Pharmacotherapy: No side-effects or adverse reactions reported. Compliance: No problems identified. Effectiveness: Clinically acceptable.  Aaron Shorter, RN  11/04/2020 11:18 AM  Sign when Signing Visit Nursing Pain Medication Assessment:  Safety precautions to be maintained throughout the outpatient stay will include: orient to surroundings, keep bed in low position, maintain call bell within reach at all times, provide assistance with transfer out of bed and ambulation.  Medication Inspection Compliance: Pill count conducted under aseptic conditions, in front of the patient. Neither the pills nor the bottle was removed from the patient's sight at any time. Once count was completed pills were immediately returned to the patient in their original bottle.  Medication: Oxycodone/APAP Pill/Patch Count:  0 of 120 pills remain Pill/Patch Appearance: Markings consistent with prescribed medication Bottle Appearance: Standard pharmacy container. Clearly labeled. Filled Date: 05 14 / 2022 Last Medication intake:  Today     UDS:  Summary  Date Value Ref Range Status  01/07/2020 Note  Final    Comment:    ====================================================================  [employer]  (MW) ==================================================================== Test                             Result       Flag       Units  Drug Present and Declared for Prescription Verification   Oxycodone                      2615         EXPECTED   ng/mg creat   Oxymorphone                    2239         EXPECTED   ng/mg creat   Noroxycodone                   >4049        EXPECTED   ng/mg creat   Noroxymorphone                 1409         EXPECTED   ng/mg creat    Sources of oxycodone are scheduled prescription medications.    Oxymorphone, noroxycodone, and noroxymorphone are expected    metabolites of oxycodone. Oxymorphone is also available as a    scheduled prescription medication.  ==================================================================== Test                      Result    Flag   Units      Ref Range   Creatinine              247              mg/dL      >=20 ==================================================================== Declared Medications:  The flagging and interpretation on this report are based on the  following declared medications.  Unexpected results may arise from  inaccuracies in the declared medications.   **Note: The testing scope of this panel includes these medications:   Oxycodone   **Note: The testing scope of this panel does not include the  following reported medications:   Acetaminophen (Tylenol)  Carisoprodol (Soma)  Digoxin (Lanoxin)  Furosemide (Lasix)  Magnesium  Metoprolol (Toprol)  Naloxone (Narcan)  Rivaroxaban (Xarelto)  Rosuvastatin (Crestor)  Triamcinolone (Kenalog) ==================================================================== For clinical consultation, please call [phone]. ====================================================================      ROS  Constitutional: Denies any fever or chills Gastrointestinal: No reported hemesis, hematochezia, vomiting, or acute GI distress Musculoskeletal:   thoracic and lumbar back pain Neurological: No reported episodes of acute onset apraxia, aphasia, dysarthria, agnosia, amnesia, paralysis, loss of coordination, or loss of consciousness  Medication Review  DULoxetine, Magnesium, acetaminophen, carisoprodol, digoxin, furosemide, metoprolol succinate, naloxone, oxyCODONE-acetaminophen, prazosin, rivaroxaban, and rosuvastatin  History Review  Allergy: Aaron Short is allergic to diltiazem, penicillins, and tizanidine. Drug: Aaron Short  reports no history of drug use. Alcohol:  reports no history of alcohol use. Tobacco:  reports that he has been smoking cigarettes. He has been smoking an average of 0.50 packs per day. He has never used smokeless tobacco. Social: Aaron Short  reports that he has been smoking cigarettes. He has been smoking an average of 0.50 packs per day. He has never used smokeless tobacco. He reports that he does not drink alcohol and does not use drugs. Medical:  has a past medical history of Arthralgia of lower leg (04/02/2012), Arthralgia of upper arm (06/18/2009),  Cancer ([place]), Diabetes mellitus without complication ([place]), FH: atrial fibrillation, and Hypertension. Surgical: Mr. Cuthrell  has a past surgical history that includes bladder cancer surgery (2015); Joint replacement; Replacement total hip w/  resurfacing implants (Bilateral); Total knee arthroplasty (Bilateral); and Shoulder surgery (Bilateral). Family: family history includes Dementia in his mother; Heart disease in his father.  Laboratory Chemistry Profile   Renal Lab Results  Component Value Date   BUN 11 02/10/2019   CREATININE 0.85 02/10/2019   BCR 13 02/10/2019   GFRAA 99 02/10/2019   GFRNONAA 85 02/10/2019     Hepatic Lab Results  Component Value Date   AST 15 02/10/2019   ALT 17 11/18/2015   ALBUMIN 4.0 02/10/2019   ALKPHOS 89 02/10/2019     Electrolytes Lab Results  Component Value Date   NA 141 02/10/2019   K 4.9  02/10/2019   CL 102 02/10/2019   CALCIUM 9.4 02/10/2019   MG 1.9 02/10/2019     Bone Lab Results  Component Value Date   25OHVITD1 32 02/10/2019   25OHVITD2 <1.0 02/10/2019   25OHVITD3 32 02/10/2019     Inflammation (CRP: Acute Phase) (ESR: Chronic Phase) Lab Results  Component Value Date   CRP 7 02/10/2019   ESRSEDRATE 21 02/10/2019       Note: Above Lab results reviewed.  Recent Imaging Review  DG Lumbar Spine Complete W/Bend CLINICAL DATA:  Low back pain for 1 month radiating down LEFT leg, injured back lifting something heavy, lumbar spondylosis, lumbar facet syndrome  EXAM: LUMBAR SPINE - COMPLETE WITH BENDING VIEWS  COMPARISON:  11/18/2015  FINDINGS: Osseous demineralization.  Five non-rib-bearing lumbar vertebra.  Multilevel endplate spur formation throughout lumbar spine.  Minimal disc space narrowing L4-L5.  Vertebral body heights maintained without fracture or bone destruction.  Minimal retrolisthesis at L5-S1 approximately 3 mm which increases to 6 mm with extension and appears unchanged with flexion.  Remaining alignments normal it out abnormal motion with flexion or extension.  SI joints preserved.  BILATERAL hip prostheses.  IMPRESSION: Scattered degenerative disc disease changes of the lumbar spine.  Minimal retrolisthesis at L5-S1 which mildly increases with extension and is unchanged with flexion.  Electronically Signed   By: Lavonia Dana M.D.   On: 11/08/2017 11:02 Note: Reviewed        Physical Exam  General appearance: Well nourished, well developed, and well hydrated. In no apparent acute distress Mental status: Alert, oriented x 3 (person, place, & time)       Respiratory: No evidence of acute respiratory distress Eyes: PERLA Vitals: BP 122/68 (BP Location: Right Arm, Patient Position: Sitting, Cuff Size: Normal)   Pulse (!) 50   Temp (!) 97 F (36.1 C)   Resp 18   Ht _0  (1.727 m)   Wt 178 lb (80.7 kg)   SpO2 98%    BMI 27.06 kg/m  BMI: Estimated body mass index is 27.06 kg/m as calculated from the following:   Height as of this encounter: _1  (1.727 m).   Weight as of this encounter: 178 lb (80.7 kg). Ideal: Ideal body weight: 68.4 kg (150 lb 12.7 oz) Adjusted ideal body weight: 73.3 kg (161 lb 10.8 oz)  Thoracic Spine Area Exam  Skin & Axial Inspection: Significant thoracic kyphosis Alignment: Asymmetric Functional ROM: Pain restricted ROM Stability: No instability detected Muscle Tone/Strength: Functionally intact. No obvious neuro-muscular anomalies detected. Sensory (Neurological): Neurogenic pain pattern Muscle strength & Tone: No palpable anomalies Lumbar Spine Area Exam  Skin &  Axial Inspection: Lumbar Scoliosis Alignment: Asymmetric Functional ROM: Pain restricted ROM       Stability: No instability detected Muscle Tone/Strength: Functionally intact. No obvious neuro-muscular anomalies detected. Sensory (Neurological): Neurogenic pain pattern Palpation: Complains of area being tender to palpation         Gait & Posture Assessment  Ambulation: Patient came in today in a wheel chair Gait: Significantly limited. Dependent on assistive device to ambulate Posture: Difficulty standing up straight, due to pain  Lower Extremity Exam      Side: Right lower extremity   Side: Left lower extremity  Stability: No instability observed           Stability: No instability observed          Skin & Extremity Inspection: Skin color, temperature, and hair growth are WNL. No peripheral edema or cyanosis. No masses, redness, swelling, asymmetry, or associated skin lesions. No contractures.   Skin & Extremity Inspection: Skin color, temperature, and hair growth are WNL. No peripheral edema or cyanosis. No masses, redness, swelling, asymmetry, or associated skin lesions. No contractures.  Functional ROM: Pain restricted ROM for hip and knee joints           Functional ROM: Pain restricted ROM for hip and  knee joints          Muscle Tone/Strength: Functionally intact. No obvious neuro-muscular anomalies detected.   Muscle Tone/Strength: Functionally intact. No obvious neuro-muscular anomalies detected.  Sensory (Neurological): Dermatomal pain pattern         Sensory (Neurological): Dermatomal pain pattern        DTR: Patellar: deferred today Achilles: deferred today Plantar: deferred today   DTR: Patellar: deferred today Achilles: deferred today Plantar: deferred today  Palpation: No palpable anomalies   Palpation: No palpable anomalies     Assessment   Status Diagnosis  Controlled Controlled Controlled 1. Osteoarthritis of shoulder (Bilateral) (L>R)   2. Chronic shoulder pain (Primary Area of Pain) (Bilateral) (L>R)   3. Chronic hip pain (Secondary area of Pain) (Bilateral) (R>L)   4. Chronic low back pain (Third area of Pain) (Bilateral) (R>L)   5. T12 compression fracture, sequela   6. Long term current use of opiate analgesic   7. Chronic shoulder pain (Left)   8. Chronic pain syndrome        Plan of Care   Mr. Kamar Callender has a current medication list which includes the following long-term medication(s): digoxin, duloxetine, furosemide, metoprolol succinate, prazosin, rivaroxaban, narcan, oxycodone-acetaminophen, [START ON 12/04/2020] oxycodone-acetaminophen, and [START ON 01/03/2021] oxycodone-acetaminophen.  Continue with home physical therapy exercises and diet modification to facilitate weight loss  Pharmacotherapy (Medications Ordered): Meds ordered this encounter  Medications   oxyCODONE-acetaminophen (PERCOCET) 10-325 MG tablet    Sig: Take 1 tablet by mouth every 6 (six) hours as needed for pain.    Dispense:  120 tablet    Refill:  0    For chronic pain syndrome   oxyCODONE-acetaminophen (PERCOCET) 10-325 MG tablet    Sig: Take 1 tablet by mouth every 6 (six) hours as needed for pain.    Dispense:  120 tablet    Refill:  0    For chronic pain syndrome    oxyCODONE-acetaminophen (PERCOCET) 10-325 MG tablet    Sig: Take 1 tablet by mouth every 6 (six) hours as needed for pain.    Dispense:  120 tablet    Refill:  0    For chronic pain syndrome  Continue with Cymbalta  Follow-up  plan:   Return in about 3 months (around 02/04/2021) for Medication Management, in person.   Recent Visits Date Type Provider Dept  08/10/20 Office Visit Aaron Santa, MD [facility]  Showing recent visits within past 90 days and meeting all other requirements Today's Visits Date Type Provider Dept  11/04/20 Office Visit Aaron Santa, MD [facility]  Showing today's visits and meeting all other requirements Future Appointments No visits were found meeting these conditions. Showing future appointments within next 90 days and meeting all other requirements I discussed the assessment and treatment plan with the patient. The patient was provided an opportunity to ask questions and all were answered. The patient agreed with the plan and demonstrated an understanding of the instructions.  Patient advised to call back or seek an in-person evaluation if the symptoms or condition worsens.  Duration of encounter: 30 minutes.  Note by: Aaron Santa, MD Date: 11/04/2020; Time: 11:45 AM

## 2020-12-29 DIAGNOSIS — R634 Abnormal weight loss: Secondary | ICD-10-CM | POA: Insufficient documentation

## 2020-12-29 DIAGNOSIS — C159 Malignant neoplasm of esophagus, unspecified: Secondary | ICD-10-CM | POA: Insufficient documentation

## 2021-02-03 ENCOUNTER — Ambulatory Visit
Payer: Medicare Other | Attending: Student in an Organized Health Care Education/Training Program | Admitting: Student in an Organized Health Care Education/Training Program

## 2021-02-03 ENCOUNTER — Other Ambulatory Visit: Payer: Self-pay

## 2021-02-03 ENCOUNTER — Encounter: Payer: Self-pay | Admitting: Student in an Organized Health Care Education/Training Program

## 2021-02-03 VITALS — BP 130/65 | HR 41 | Temp 97.1°F | Resp 16 | Ht 68.0 in | Wt 178.0 lb

## 2021-02-03 DIAGNOSIS — M25511 Pain in right shoulder: Secondary | ICD-10-CM | POA: Diagnosis not present

## 2021-02-03 DIAGNOSIS — S22080S Wedge compression fracture of T11-T12 vertebra, sequela: Secondary | ICD-10-CM

## 2021-02-03 DIAGNOSIS — Z79891 Long term (current) use of opiate analgesic: Secondary | ICD-10-CM | POA: Diagnosis present

## 2021-02-03 DIAGNOSIS — G8929 Other chronic pain: Secondary | ICD-10-CM | POA: Diagnosis present

## 2021-02-03 DIAGNOSIS — G894 Chronic pain syndrome: Secondary | ICD-10-CM

## 2021-02-03 DIAGNOSIS — M25559 Pain in unspecified hip: Secondary | ICD-10-CM

## 2021-02-03 DIAGNOSIS — M25512 Pain in left shoulder: Secondary | ICD-10-CM | POA: Diagnosis present

## 2021-02-03 DIAGNOSIS — M545 Low back pain, unspecified: Secondary | ICD-10-CM | POA: Diagnosis not present

## 2021-02-03 DIAGNOSIS — M19012 Primary osteoarthritis, left shoulder: Secondary | ICD-10-CM

## 2021-02-03 DIAGNOSIS — M19011 Primary osteoarthritis, right shoulder: Secondary | ICD-10-CM | POA: Diagnosis not present

## 2021-02-03 MED ORDER — OXYCODONE-ACETAMINOPHEN 10-325 MG PO TABS: 1.0000 | ORAL_TABLET | Freq: Four times a day (QID) | ORAL | 0 refills | Status: DC | PRN

## 2021-02-03 NOTE — Progress Notes (Signed)
Patient: Aaron Short  Service Category: E/M  Provider: Gillis Santa, MD  DOB: 1945-10-29  DOS: 02/03/2021  Specialty: Interventional Pain Management  MRN: [id number]  Setting: Ambulatory outpatient  PCP: Neomia Dear, MD  Type: Established Patient    Referring Provider: Neomia Dear, MD  Location: Office  Delivery: Face-to-face     HPI  [name], a 75 y.o. year old male, is here today because of his Primary osteoarthritis of shoulders, bilateral [M19.011, M19.012]. Aaron Short primary complain today is Back Pain (Entire back ) and Shoulder Pain (Left ) Last encounter: My last encounter with him was on 11/04/20 Pertinent problems: Aaron Short has Long term current use of opiate analgesic; Long term prescription opiate use; Opioid dependence, daily use ([place]); Chronic hip pain (Secondary area of Pain) (Bilateral) (R>L); Chronic shoulder pain (Primary Area of Pain) (Bilateral) (L>R); Chronic low back pain (Third area of Pain) (Bilateral) (R>L); S/P THR: total hip replacement (Bilateral); Lumbar facet syndrome (Bilateral) (R>L); Lumbar spondylosis; Chronic pain syndrome; Chronic acromioclavicular joint pain (Left); Osteoarthritis of shoulder (Bilateral) (L>R); and T12 compression fracture, sequela on their pertinent problem list. Pain Assessment: Severity of Chronic pain is reported as a 1 /10. Location: Back (left shoulder) Left, Right, Upper, Mid, Lower/denies. Onset: More than a month ago. Quality: Discomfort, Constant, Other (Comment), Sharp (crawling). Timing: Constant. Modifying factor(s): medications. Vitals:  height is _0  (1.727 m) and weight is 178 lb (80.7 kg). His temporal temperature is 97.1 F (36.2 C) (abnormal). His blood pressure is 130/65 and his pulse is 41 (abnormal). His respiration is 16 and oxygen saturation is 95%.   Reason for encounter: medication management.    No change in medical history since last visit.  Patient's pain is at  baseline.  Patient continues multimodal pain regimen as prescribed.  States that it provides pain relief and improvement in functional status. Renew UDS today   Pharmacotherapy Assessment  Analgesic:  Percocet 10 mg 4 times daily as needed, quantity 120/month given compression fractures resulting chronic pain.     Monitoring: [place] PMP: PDMP reviewed during this encounter.       Pharmacotherapy: No side-effects or adverse reactions reported. Compliance: No problems identified. Effectiveness: Clinically acceptable.  UDS:  Summary  Date Value Ref Range Status  01/07/2020 Note  Final    Comment:    ==================================================================== [employer] [facility] (MW) ==================================================================== Test                             Result       Flag       Units  Drug Present and Declared for Prescription Verification   Oxycodone                      2615         EXPECTED   ng/mg creat   Oxymorphone                    2239         EXPECTED   ng/mg creat   Noroxycodone                   >4049        EXPECTED   ng/mg creat   Noroxymorphone                 1409         EXPECTED   ng/mg creat  Sources of oxycodone are scheduled prescription medications.    Oxymorphone, noroxycodone, and noroxymorphone are expected    metabolites of oxycodone. Oxymorphone is also available as a    scheduled prescription medication.  ==================================================================== Test                      Result    Flag   Units      Ref Range   Creatinine              247              mg/dL      >=20 ==================================================================== Declared Medications:  The flagging and interpretation on this report are based on the  following declared medications.  Unexpected results may arise from  inaccuracies in the declared medications.   **Note: The testing scope of this panel includes these  medications:   Oxycodone   **Note: The testing scope of this panel does not include the  following reported medications:   Acetaminophen (Tylenol)  Carisoprodol (Soma)  Digoxin (Lanoxin)  Furosemide (Lasix)  Magnesium  Metoprolol (Toprol)  Naloxone (Narcan)  Rivaroxaban (Xarelto)  Rosuvastatin (Crestor)  Triamcinolone (Kenalog) ==================================================================== For clinical consultation, please call [phone]. ====================================================================       ROS  Constitutional: Denies any fever or chills Gastrointestinal: No reported hemesis, hematochezia, vomiting, or acute GI distress Musculoskeletal:  thoracic and lumbar back pain Neurological: No reported episodes of acute onset apraxia, aphasia, dysarthria, agnosia, amnesia, paralysis, loss of coordination, or loss of consciousness  Medication Review  DULoxetine, Magnesium, acetaminophen, carisoprodol, digoxin, furosemide, metoprolol succinate, naloxone, oxyCODONE-acetaminophen, prazosin, rivaroxaban, and rosuvastatin  History Review  Allergy: [name] is allergic to diltiazem, penicillins, and tizanidine. Drug: [name]  reports no history of drug use. Alcohol:  reports no history of alcohol use. Tobacco:  reports that he has been smoking cigarettes. He has been smoking an average of .5 packs per day. He has never used smokeless tobacco. Social: [name]  reports that he has been smoking cigarettes. He has been smoking an average of .5 packs per day. He has never used smokeless tobacco. He reports that he does not drink alcohol and does not use drugs. Medical:  has a past medical history of Arthralgia of lower leg (04/02/2012), Arthralgia of upper arm (06/18/2009), Cancer ([place]), Diabetes mellitus without complication ([place]), FH: atrial fibrillation, and Hypertension. Surgical: [name]  has a past surgical history that  includes bladder cancer surgery (2015); Joint replacement; Replacement total hip w/  resurfacing implants (Bilateral); Total knee arthroplasty (Bilateral); and Shoulder surgery (Bilateral). Family: family history includes Dementia in his mother; Heart disease in his father.  Laboratory Chemistry Profile   Renal Lab Results  Component Value Date   BUN 11 02/10/2019   CREATININE 0.85 02/10/2019   BCR 13 02/10/2019   GFRAA 99 02/10/2019   GFRNONAA 85 02/10/2019     Hepatic Lab Results  Component Value Date   AST 15 02/10/2019   ALT 17 11/18/2015   ALBUMIN 4.0 02/10/2019   ALKPHOS 89 02/10/2019     Electrolytes Lab Results  Component Value Date   NA 141 02/10/2019   K 4.9 02/10/2019   CL 102 02/10/2019   CALCIUM 9.4 02/10/2019   MG 1.9 02/10/2019     Bone Lab Results  Component Value Date   25OHVITD1 32 02/10/2019   25OHVITD2 <1.0 02/10/2019   25OHVITD3 32 02/10/2019     Inflammation (CRP: Acute Phase) (ESR: Chronic Phase)  Lab Results  Component Value Date   CRP 7 02/10/2019   ESRSEDRATE 21 02/10/2019       Note: Above Lab results reviewed.  Recent Imaging Review  DG Lumbar Spine Complete W/Bend CLINICAL DATA:  Low back pain for 1 month radiating down LEFT leg, injured back lifting something heavy, lumbar spondylosis, lumbar facet syndrome  EXAM: LUMBAR SPINE - COMPLETE WITH BENDING VIEWS  COMPARISON:  11/18/2015  FINDINGS: Osseous demineralization.  Five non-rib-bearing lumbar vertebra.  Multilevel endplate spur formation throughout lumbar spine.  Minimal disc space narrowing L4-L5.  Vertebral body heights maintained without fracture or bone destruction.  Minimal retrolisthesis at L5-S1 approximately 3 mm which increases to 6 mm with extension and appears unchanged with flexion.  Remaining alignments normal it out abnormal motion with flexion or extension.  SI joints preserved.  BILATERAL hip prostheses.  IMPRESSION: Scattered  degenerative disc disease changes of the lumbar spine.  Minimal retrolisthesis at L5-S1 which mildly increases with extension and is unchanged with flexion.  Electronically Signed   By: Lavonia Dana M.D.   On: 11/08/2017 11:02 Note: Reviewed        Physical Exam  General appearance: Well nourished, well developed, and well hydrated. In no apparent acute distress Mental status: Alert, oriented x 3 (person, place, & time)       Respiratory: No evidence of acute respiratory distress Eyes: PERLA Vitals: BP 130/65 (BP Location: Right Arm, Patient Position: Sitting, Cuff Size: Normal)   Pulse (!) 41   Temp (!) 97.1 F (36.2 C) (Temporal)   Resp 16   Ht $R'5\' 8"'em$  (1.727 m)   Wt 178 lb (80.7 kg)   SpO2 95%   BMI 27.06 kg/m  BMI: Estimated body mass index is 27.06 kg/m as calculated from the following:   Height as of this encounter: $RemoveBeforeD'5\' 8"'AkRpWNQNbyLNDM$  (1.727 m).   Weight as of this encounter: 178 lb (80.7 kg). Ideal: Ideal body weight: 68.4 kg (150 lb 12.7 oz) Adjusted ideal body weight: 73.3 kg (161 lb 10.8 oz)  Thoracic Spine Area Exam  Skin & Axial Inspection: Significant thoracic kyphosis Alignment: Asymmetric Functional ROM: Pain restricted ROM Stability: No instability detected Muscle Tone/Strength: Functionally intact. No obvious neuro-muscular anomalies detected. Sensory (Neurological): Neurogenic pain pattern Muscle strength & Tone: No palpable anomalies Lumbar Spine Area Exam  Skin & Axial Inspection: Lumbar Scoliosis Alignment: Asymmetric Functional ROM: Pain restricted ROM       Stability: No instability detected Muscle Tone/Strength: Functionally intact. No obvious neuro-muscular anomalies detected. Sensory (Neurological): Neurogenic pain pattern Palpation: Complains of area being tender to palpation         Gait & Posture Assessment  Ambulation: Patient came in today in a wheel chair Gait: Significantly limited. Dependent on assistive device to ambulate Posture: Difficulty  standing up straight, due to pain  Lower Extremity Exam      Side: Right lower extremity   Side: Left lower extremity  Stability: No instability observed           Stability: No instability observed          Skin & Extremity Inspection: Skin color, temperature, and hair growth are WNL. No peripheral edema or cyanosis. No masses, redness, swelling, asymmetry, or associated skin lesions. No contractures.   Skin & Extremity Inspection: Skin color, temperature, and hair growth are WNL. No peripheral edema or cyanosis. No masses, redness, swelling, asymmetry, or associated skin lesions. No contractures.  Functional ROM: Pain restricted ROM for hip and knee joints  Functional ROM: Pain restricted ROM for hip and knee joints          Muscle Tone/Strength: Functionally intact. No obvious neuro-muscular anomalies detected.   Muscle Tone/Strength: Functionally intact. No obvious neuro-muscular anomalies detected.  Sensory (Neurological): Dermatomal pain pattern         Sensory (Neurological): Dermatomal pain pattern        DTR: Patellar: deferred today Achilles: deferred today Plantar: deferred today   DTR: Patellar: deferred today Achilles: deferred today Plantar: deferred today  Palpation: No palpable anomalies   Palpation: No palpable anomalies     Assessment   Status Diagnosis  Controlled Controlled Controlled 1. Osteoarthritis of shoulder (Bilateral) (L>R)   2. Chronic shoulder pain (Primary Area of Pain) (Bilateral) (L>R)   3. Chronic hip pain (Secondary area of Pain) (Bilateral) (R>L)   4. Chronic low back pain (Third area of Pain) (Bilateral) (R>L)   5. T12 compression fracture, sequela   6. Long term current use of opiate analgesic   7. Chronic shoulder pain (Left)   8. Chronic pain syndrome        Plan of Care   Mr. Addam Goeller has a current medication list which includes the following long-term medication(s): digoxin, duloxetine, furosemide, metoprolol  succinate, prazosin, rivaroxaban, narcan, oxycodone-acetaminophen, [START ON 03/05/2021] oxycodone-acetaminophen, and [START ON 04/04/2021] oxycodone-acetaminophen.  Continue with home physical therapy exercises and diet modification to facilitate weight loss  Pharmacotherapy (Medications Ordered): Meds ordered this encounter  Medications   oxyCODONE-acetaminophen (PERCOCET) 10-325 MG tablet    Sig: Take 1 tablet by mouth every 6 (six) hours as needed for pain.    Dispense:  120 tablet    Refill:  0    For chronic pain syndrome   oxyCODONE-acetaminophen (PERCOCET) 10-325 MG tablet    Sig: Take 1 tablet by mouth every 6 (six) hours as needed for pain.    Dispense:  120 tablet    Refill:  0    For chronic pain syndrome   oxyCODONE-acetaminophen (PERCOCET) 10-325 MG tablet    Sig: Take 1 tablet by mouth every 6 (six) hours as needed for pain.    Dispense:  120 tablet    Refill:  0    For chronic pain syndrome  Continue with Cymbalta  Follow-up plan:   Return in about 3 months (around 05/05/2021) for Medication Management, in person.   Recent Visits No visits were found meeting these conditions. Showing recent visits within past 90 days and meeting all other requirements Today's Visits Date Type Provider Dept  02/03/21 Office Visit Gillis Santa, MD [facility]  Showing today's visits and meeting all other requirements Future Appointments Date Type Provider Dept  05/03/21 Appointment Gillis Santa, MD [facility]  Showing future appointments within next 90 days and meeting all other requirements I discussed the assessment and treatment plan with the patient. The patient was provided an opportunity to ask questions and all were answered. The patient agreed with the plan and demonstrated an understanding of the instructions.  Patient advised to call back or seek an in-person evaluation if the symptoms or condition worsens.  Duration of encounter: 30  minutes.  Note by: Gillis Santa, MD Date: 02/03/2021; Time: 9:28 AM

## 2021-02-03 NOTE — Progress Notes (Signed)
Nursing Pain Medication Assessment:  Safety precautions to be maintained throughout the outpatient stay will include: orient to surroundings, keep bed in low position, maintain call bell within reach at all times, provide assistance with transfer out of bed and ambulation.  Medication Inspection Compliance: Pill count conducted under aseptic conditions, in front of the patient. Neither the pills nor the bottle was removed from the patient's sight at any time. Once count was completed pills were immediately returned to the patient in their original bottle.  Medication: Oxycodone/APAP Pill/Patch Count:  0 of 120 pills remain Pill/Patch Appearance: Markings consistent with prescribed medication Bottle Appearance: Standard pharmacy container. Clearly labeled. Filled Date: 08 / 08 / 2022 Last Medication intake:  Yesterday

## 2021-02-10 LAB — TOXASSURE SELECT 13 (MW), URINE

## 2021-05-02 DIAGNOSIS — M810 Age-related osteoporosis without current pathological fracture: Secondary | ICD-10-CM | POA: Insufficient documentation

## 2021-05-03 ENCOUNTER — Ambulatory Visit
Payer: Medicare Other | Attending: Student in an Organized Health Care Education/Training Program | Admitting: Student in an Organized Health Care Education/Training Program

## 2021-05-03 ENCOUNTER — Other Ambulatory Visit: Payer: Self-pay

## 2021-05-03 ENCOUNTER — Encounter: Payer: Self-pay | Admitting: Student in an Organized Health Care Education/Training Program

## 2021-05-03 VITALS — BP 132/61 | HR 58 | Temp 97.2°F | Resp 18 | Ht 68.0 in | Wt 172.0 lb

## 2021-05-03 DIAGNOSIS — M19012 Primary osteoarthritis, left shoulder: Secondary | ICD-10-CM | POA: Diagnosis present

## 2021-05-03 DIAGNOSIS — M47816 Spondylosis without myelopathy or radiculopathy, lumbar region: Secondary | ICD-10-CM | POA: Insufficient documentation

## 2021-05-03 DIAGNOSIS — Z79891 Long term (current) use of opiate analgesic: Secondary | ICD-10-CM | POA: Diagnosis present

## 2021-05-03 DIAGNOSIS — M545 Low back pain, unspecified: Secondary | ICD-10-CM | POA: Insufficient documentation

## 2021-05-03 DIAGNOSIS — S22080S Wedge compression fracture of T11-T12 vertebra, sequela: Secondary | ICD-10-CM | POA: Insufficient documentation

## 2021-05-03 DIAGNOSIS — M25512 Pain in left shoulder: Secondary | ICD-10-CM | POA: Diagnosis present

## 2021-05-03 DIAGNOSIS — M19011 Primary osteoarthritis, right shoulder: Secondary | ICD-10-CM | POA: Insufficient documentation

## 2021-05-03 DIAGNOSIS — G8929 Other chronic pain: Secondary | ICD-10-CM | POA: Insufficient documentation

## 2021-05-03 DIAGNOSIS — M25559 Pain in unspecified hip: Secondary | ICD-10-CM | POA: Insufficient documentation

## 2021-05-03 DIAGNOSIS — M25511 Pain in right shoulder: Secondary | ICD-10-CM | POA: Insufficient documentation

## 2021-05-03 DIAGNOSIS — G894 Chronic pain syndrome: Secondary | ICD-10-CM | POA: Insufficient documentation

## 2021-05-03 MED ORDER — OXYCODONE-ACETAMINOPHEN 10-325 MG PO TABS: 1.0000 | ORAL_TABLET | Freq: Four times a day (QID) | ORAL | 0 refills | Status: DC | PRN

## 2021-05-03 NOTE — Progress Notes (Signed)
Patient: Aaron Short  Service Category: E/M  Provider: Gillis Santa, MD  DOB: 1945-06-28  DOS: 05/03/2021  Specialty: Interventional Pain Management  MRN: [id number]  Setting: Ambulatory outpatient  PCP: Neomia Dear, MD  Type: Established Patient    Referring Provider: Neomia Dear, MD  Location: Office  Delivery: Face-to-face     HPI  Mr. Zarian Colpitts, a 75 y.o. year old male, is here today because of his Primary osteoarthritis of shoulders, bilateral [M19.011, M19.012]. Mr. Blazier primary complain today is Back Pain (Lower, mid) Last encounter: My last encounter with him was on 02/03/21 Pertinent problems: Mr. Oravec has Long term current use of opiate analgesic; Long term prescription opiate use; Opioid dependence, daily use ([place]); Chronic hip pain (Secondary area of Pain) (Bilateral) (R>L); Chronic shoulder pain (Primary Area of Pain) (Bilateral) (L>R); Chronic low back pain (Third area of Pain) (Bilateral) (R>L); S/P THR: total hip replacement (Bilateral); Lumbar facet syndrome (Bilateral) (R>L); Lumbar spondylosis; Chronic pain syndrome; Chronic acromioclavicular joint pain (Left); Osteoarthritis of shoulder (Bilateral) (L>R); and T12 compression fracture, sequela on their pertinent problem list. Pain Assessment: Severity of Chronic pain is reported as a 1 /10. Location: Back Lower, Mid/denies. Onset: More than a month ago. Quality: Sharp. Timing: Constant. Modifying factor(s): meds. Vitals:  height is '5\' 8"'  (1.727 m) and weight is 172 lb (78 kg). His temporal temperature is 97.2 F (36.2 C) (abnormal). His blood pressure is 132/61 and his pulse is 58 (abnormal). His respiration is 18 and oxygen saturation is 95%.   Reason for encounter: medication management.    No change in medical history since last visit.  Patient's pain is at baseline.  Patient continues multimodal pain regimen as prescribed.  States that it provides pain relief and improvement in  functional status. Patient continue Cymbalta as prescribed.  No falls since his last visit.  Patient cautioned on taking Soma with oxycodone.  He has been on Soma for many years and is aware of my concerns regarding this medication and its concomitant intake with oxycodone.  Patient does have Narcan at home.    Pharmacotherapy Assessment  Analgesic:  Percocet 10 mg 4 times daily as needed, quantity 120/month given compression fractures resulting chronic pain.     Monitoring: [place] PMP: PDMP reviewed during this encounter.       Pharmacotherapy: No side-effects or adverse reactions reported. Compliance: No problems identified. Effectiveness: Clinically acceptable.  UDS:  Summary  Date Value Ref Range Status  02/03/2021 Note  Final    Comment:    ==================================================================== [employer] [facility] (MW) ==================================================================== Test                             Result       Flag       Units  Drug Present and Declared for Prescription Verification   Oxycodone                      3705         EXPECTED   ng/mg creat   Oxymorphone                    2981         EXPECTED   ng/mg creat   Noroxycodone                   >6452        EXPECTED   ng/mg creat  Noroxymorphone                 1863         EXPECTED   ng/mg creat    Sources of oxycodone are scheduled prescription medications.    Oxymorphone, noroxycodone, and noroxymorphone are expected    metabolites of oxycodone. Oxymorphone is also available as a    scheduled prescription medication.  ==================================================================== Test                      Result    Flag   Units      Ref Range   Creatinine              155              mg/dL      >=20 ==================================================================== Declared Medications:  The flagging and interpretation on this report are based on the  following declared  medications.  Unexpected results may arise from  inaccuracies in the declared medications.   **Note: The testing scope of this panel includes these medications:   Oxycodone (Percocet)   **Note: The testing scope of this panel does not include the  following reported medications:   Acetaminophen (Tylenol)  Acetaminophen (Percocet)  Carisoprodol (Soma)  Digoxin (Lanoxin)  Duloxetine (Cymbalta)  Furosemide (Lasix)  Magnesium  Metoprolol (Toprol)  Naloxone (Narcan)  Prazosin (Minipress)  Rivaroxaban (Xarelto)  Rosuvastatin (Crestor) ==================================================================== For clinical consultation, please call [phone]. ====================================================================       ROS  Constitutional: Denies any fever or chills Gastrointestinal: No reported hemesis, hematochezia, vomiting, or acute GI distress Musculoskeletal:  thoracic and lumbar back pain Neurological: No reported episodes of acute onset apraxia, aphasia, dysarthria, agnosia, amnesia, paralysis, loss of coordination, or loss of consciousness  Medication Review  DULoxetine, Magnesium, acetaminophen, carisoprodol, digoxin, furosemide, metoprolol succinate, naloxone, oxyCODONE-acetaminophen, prazosin, rivaroxaban, and rosuvastatin  History Review  Allergy: Mr. Som is allergic to diltiazem, penicillins, and tizanidine. Drug: Mr. Robarts  reports no history of drug use. Alcohol:  reports no history of alcohol use. Tobacco:  reports that he has been smoking cigarettes. He has been smoking an average of .5 packs per day. He has never used smokeless tobacco. Social: Mr. Gilham  reports that he has been smoking cigarettes. He has been smoking an average of .5 packs per day. He has never used smokeless tobacco. He reports that he does not drink alcohol and does not use drugs. Medical:  has a past medical history of Arthralgia of lower leg (04/02/2012),  Arthralgia of upper arm (06/18/2009), Cancer ([place]), Diabetes mellitus without complication ([place]), FH: atrial fibrillation, and Hypertension. Surgical: Mr. Blizzard  has a past surgical history that includes bladder cancer surgery (2015); Joint replacement; Replacement total hip w/  resurfacing implants (Bilateral); Total knee arthroplasty (Bilateral); and Shoulder surgery (Bilateral). Family: family history includes Dementia in his mother; Heart disease in his father.  Laboratory Chemistry Profile   Renal Lab Results  Component Value Date   BUN 11 02/10/2019   CREATININE 0.85 02/10/2019   BCR 13 02/10/2019   GFRAA 99 02/10/2019   GFRNONAA 85 02/10/2019     Hepatic Lab Results  Component Value Date   AST 15 02/10/2019   ALT 17 11/18/2015   ALBUMIN 4.0 02/10/2019   ALKPHOS 89 02/10/2019     Electrolytes Lab Results  Component Value Date   NA 141 02/10/2019   K 4.9 02/10/2019   CL 102 02/10/2019   CALCIUM 9.4 02/10/2019  MG 1.9 02/10/2019     Bone Lab Results  Component Value Date   25OHVITD1 32 02/10/2019   25OHVITD2 <1.0 02/10/2019   25OHVITD3 32 02/10/2019     Inflammation (CRP: Acute Phase) (ESR: Chronic Phase) Lab Results  Component Value Date   CRP 7 02/10/2019   ESRSEDRATE 21 02/10/2019       Note: Above Lab results reviewed.  Recent Imaging Review  DG Lumbar Spine Complete W/Bend CLINICAL DATA:  Low back pain for 1 month radiating down LEFT leg, injured back lifting something heavy, lumbar spondylosis, lumbar facet syndrome  EXAM: LUMBAR SPINE - COMPLETE WITH BENDING VIEWS  COMPARISON:  11/18/2015  FINDINGS: Osseous demineralization.  Five non-rib-bearing lumbar vertebra.  Multilevel endplate spur formation throughout lumbar spine.  Minimal disc space narrowing L4-L5.  Vertebral body heights maintained without fracture or bone destruction.  Minimal retrolisthesis at L5-S1 approximately 3 mm which increases to 6 mm with extension and  appears unchanged with flexion.  Remaining alignments normal it out abnormal motion with flexion or extension.  SI joints preserved.  BILATERAL hip prostheses.  IMPRESSION: Scattered degenerative disc disease changes of the lumbar spine.  Minimal retrolisthesis at L5-S1 which mildly increases with extension and is unchanged with flexion.  Electronically Signed   By: Lavonia Dana M.D.   On: 11/08/2017 11:02 Note: Reviewed        Physical Exam  General appearance: Well nourished, well developed, and well hydrated. In no apparent acute distress Mental status: Alert, oriented x 3 (person, place, & time)       Respiratory: No evidence of acute respiratory distress Eyes: PERLA Vitals: BP 132/61 (BP Location: Right Arm, Patient Position: Sitting, Cuff Size: Normal)   Pulse (!) 58   Temp (!) 97.2 F (36.2 C) (Temporal)   Resp 18   Ht '5\' 8"'  (1.727 m)   Wt 172 lb (78 kg)   SpO2 95%   BMI 26.15 kg/m  BMI: Estimated body mass index is 26.15 kg/m as calculated from the following:   Height as of this encounter: '5\' 8"'  (1.727 m).   Weight as of this encounter: 172 lb (78 kg). Ideal: Ideal body weight: 68.4 kg (150 lb 12.7 oz) Adjusted ideal body weight: 72.2 kg (159 lb 4.4 oz)  Thoracic Spine Area Exam  Skin & Axial Inspection: Significant thoracic kyphosis Alignment: Asymmetric Functional ROM: Pain restricted ROM Stability: No instability detected Muscle Tone/Strength: Functionally intact. No obvious neuro-muscular anomalies detected. Sensory (Neurological): Neurogenic pain pattern Muscle strength & Tone: No palpable anomalies Lumbar Spine Area Exam  Skin & Axial Inspection: Lumbar Scoliosis Alignment: Asymmetric Functional ROM: Pain restricted ROM       Stability: No instability detected Muscle Tone/Strength: Functionally intact. No obvious neuro-muscular anomalies detected. Sensory (Neurological): Neurogenic pain pattern Palpation: Complains of area being tender to  palpation         Gait & Posture Assessment  Ambulation: Patient came in today in a wheel chair Gait: Significantly limited. Dependent on assistive device to ambulate Posture: Difficulty standing up straight, due to pain  Lower Extremity Exam      Side: Right lower extremity   Side: Left lower extremity  Stability: No instability observed           Stability: No instability observed          Skin & Extremity Inspection: Skin color, temperature, and hair growth are WNL. No peripheral edema or cyanosis. No masses, redness, swelling, asymmetry, or associated skin lesions. No contractures.  Skin & Extremity Inspection: Skin color, temperature, and hair growth are WNL. No peripheral edema or cyanosis. No masses, redness, swelling, asymmetry, or associated skin lesions. No contractures.  Functional ROM: Pain restricted ROM for hip and knee joints           Functional ROM: Pain restricted ROM for hip and knee joints          Muscle Tone/Strength: Functionally intact. No obvious neuro-muscular anomalies detected.   Muscle Tone/Strength: Functionally intact. No obvious neuro-muscular anomalies detected.  Sensory (Neurological): Dermatomal pain pattern         Sensory (Neurological): Dermatomal pain pattern        DTR: Patellar: deferred today Achilles: deferred today Plantar: deferred today   DTR: Patellar: deferred today Achilles: deferred today Plantar: deferred today  Palpation: No palpable anomalies   Palpation: No palpable anomalies     Assessment   Status Diagnosis  Controlled Controlled Controlled 1. Osteoarthritis of shoulder (Bilateral) (L>R)   2. Chronic shoulder pain (Primary Area of Pain) (Bilateral) (L>R)   3. Chronic hip pain (Secondary area of Pain) (Bilateral) (R>L)   4. Chronic low back pain (Third area of Pain) (Bilateral) (R>L)   5. T12 compression fracture, sequela   6. Long term current use of opiate analgesic   7. Lumbar spondylosis   8. Chronic shoulder pain (Left)    9. Chronic pain syndrome        Plan of Care   Mr. Copper Basnett has a current medication list which includes the following long-term medication(s): digoxin, duloxetine, furosemide, metoprolol succinate, narcan, prazosin, rivaroxaban, oxycodone-acetaminophen, [START ON 06/02/2021] oxycodone-acetaminophen, and [START ON 07/02/2021] oxycodone-acetaminophen.  Continue with home physical therapy exercises and diet modification to facilitate weight loss  Pharmacotherapy (Medications Ordered): Meds ordered this encounter  Medications   oxyCODONE-acetaminophen (PERCOCET) 10-325 MG tablet    Sig: Take 1 tablet by mouth every 6 (six) hours as needed for pain.    Dispense:  120 tablet    Refill:  0    For chronic pain syndrome   oxyCODONE-acetaminophen (PERCOCET) 10-325 MG tablet    Sig: Take 1 tablet by mouth every 6 (six) hours as needed for pain.    Dispense:  120 tablet    Refill:  0    For chronic pain syndrome   oxyCODONE-acetaminophen (PERCOCET) 10-325 MG tablet    Sig: Take 1 tablet by mouth every 6 (six) hours as needed for pain.    Dispense:  120 tablet    Refill:  0    For chronic pain syndrome  Continue with Cymbalta Patient has prescription for Narcan  Follow-up plan:   Return in about 3 months (around 08/01/2021) for Medication Management, in person.   Recent Visits Date Type Provider Dept  02/03/21 Office Visit Gillis Santa, MD [facility]  Showing recent visits within past 90 days and meeting all other requirements Today's Visits Date Type Provider Dept  05/03/21 Office Visit Gillis Santa, MD [facility]  Showing today's visits and meeting all other requirements Future Appointments No visits were found meeting these conditions. Showing future appointments within next 90 days and meeting all other requirements I discussed the assessment and treatment plan with the patient. The patient was provided an opportunity to ask questions and all were  answered. The patient agreed with the plan and demonstrated an understanding of the instructions.  Patient advised to call back or seek an in-person evaluation if the symptoms or condition worsens.  Duration of encounter:  30 minutes.  Note by: Gillis Santa, MD Date: 05/03/2021; Time: 10:09 AM

## 2021-05-03 NOTE — Progress Notes (Signed)
Nursing Pain Medication Assessment:  Safety precautions to be maintained throughout the outpatient stay will include: orient to surroundings, keep bed in low position, maintain call bell within reach at all times, provide assistance with transfer out of bed and ambulation.  Medication Inspection Compliance: Pill count conducted under aseptic conditions, in front of the patient. Neither the pills nor the bottle was removed from the patient's sight at any time. Once count was completed pills were immediately returned to the patient in their original bottle.  Medication: Oxycodone/APAP Pill/Patch Count:  0 of 120 pills remain Pill/Patch Appearance:  no pills Bottle Appearance: Standard pharmacy container. Clearly labeled. Filled Date: 55 / 07 / 2022 Last Medication intake:  Today

## 2021-07-28 ENCOUNTER — Encounter: Payer: Self-pay | Admitting: Student in an Organized Health Care Education/Training Program

## 2021-07-28 ENCOUNTER — Ambulatory Visit
Payer: Medicare Other | Attending: Student in an Organized Health Care Education/Training Program | Admitting: Student in an Organized Health Care Education/Training Program

## 2021-07-28 ENCOUNTER — Other Ambulatory Visit: Payer: Self-pay

## 2021-07-28 VITALS — BP 130/83 | HR 77 | Temp 98.3°F | Resp 16 | Ht 68.0 in | Wt 168.0 lb

## 2021-07-28 DIAGNOSIS — G894 Chronic pain syndrome: Secondary | ICD-10-CM | POA: Insufficient documentation

## 2021-07-28 DIAGNOSIS — M25559 Pain in unspecified hip: Secondary | ICD-10-CM | POA: Diagnosis not present

## 2021-07-28 DIAGNOSIS — M545 Low back pain, unspecified: Secondary | ICD-10-CM | POA: Diagnosis not present

## 2021-07-28 DIAGNOSIS — M19012 Primary osteoarthritis, left shoulder: Secondary | ICD-10-CM

## 2021-07-28 DIAGNOSIS — M25511 Pain in right shoulder: Secondary | ICD-10-CM | POA: Diagnosis not present

## 2021-07-28 DIAGNOSIS — G8929 Other chronic pain: Secondary | ICD-10-CM | POA: Diagnosis present

## 2021-07-28 DIAGNOSIS — S22080S Wedge compression fracture of T11-T12 vertebra, sequela: Secondary | ICD-10-CM

## 2021-07-28 DIAGNOSIS — M19011 Primary osteoarthritis, right shoulder: Secondary | ICD-10-CM | POA: Insufficient documentation

## 2021-07-28 DIAGNOSIS — Z79891 Long term (current) use of opiate analgesic: Secondary | ICD-10-CM | POA: Diagnosis present

## 2021-07-28 DIAGNOSIS — M25512 Pain in left shoulder: Secondary | ICD-10-CM | POA: Diagnosis present

## 2021-07-28 DIAGNOSIS — M47816 Spondylosis without myelopathy or radiculopathy, lumbar region: Secondary | ICD-10-CM | POA: Insufficient documentation

## 2021-07-28 MED ORDER — OXYCODONE-ACETAMINOPHEN 10-325 MG PO TABS: 1.0000 | ORAL_TABLET | Freq: Four times a day (QID) | ORAL | 0 refills | Status: DC | PRN

## 2021-07-28 NOTE — Progress Notes (Signed)
Patient: Aaron Short  Service Category: E/M  Provider: Gillis Santa, MD  DOB: 11-May-1946  DOS: 07/28/2021  Specialty: Interventional Pain Management  MRN: [id number]  Setting: Ambulatory outpatient  PCP: Neomia Dear, MD  Type: Established Patient    Referring Provider: Neomia Dear, MD  Location: Office  Delivery: Face-to-face     HPI  Aaron Short, a 76 y.o. year old male, is here today because of his Primary osteoarthritis of shoulders, bilateral [M19.011, M19.012]. Mr. Bradish primary complain today is Shoulder Pain (And back)  Last encounter: My last encounter with him was on 05/03/21  Pertinent problems: Aaron Short has Long term current use of opiate analgesic; Long term prescription opiate use; Opioid dependence, daily use ([place]); Chronic hip pain (Secondary area of Pain) (Bilateral) (R>L); Chronic shoulder pain (Primary Area of Pain) (Bilateral) (L>R); Chronic low back pain (Third area of Pain) (Bilateral) (R>L); S/P THR: total hip replacement (Bilateral); Lumbar facet syndrome (Bilateral) (R>L); Lumbar spondylosis; Chronic pain syndrome; Chronic acromioclavicular joint pain (Left); Osteoarthritis of shoulder (Bilateral) (L>R); and T12 compression fracture, sequela on their pertinent problem list. Pain Assessment: Severity of Chronic pain is reported as a 3 /10. Location: Shoulder (back) Right, Left/Denies. Onset: More than a month ago. Quality: Burning, Aching, Shooting, Sharp, Constant. Timing: Constant. Modifying factor(s): meds ice, heating, pain patches. Vitals:  height is '5\' 8"'  (1.727 m) and weight is 168 lb (76.2 kg). His temperature is 98.3 F (36.8 C). His blood pressure is 130/83 and his pulse is 77. His respiration is 16 and oxygen saturation is 95%.   Reason for encounter: medication management.    No change in medical history since last visit.  Patient's pain is at baseline.  Patient continues multimodal pain regimen as prescribed.  States  that it provides pain relief and improvement in functional status. Patient continue Cymbalta as prescribed.  No falls since his last visit.  Patient cautioned on taking Soma with oxycodone.  He has been on Soma for many years and is aware of my concerns regarding this medication and its concomitant intake with oxycodone.  Patient does have Narcan at home.    Pharmacotherapy Assessment  Analgesic:  Percocet 10 mg 4 times daily as needed, quantity 120/month given compression fractures resulting chronic pain.     Monitoring: [place] PMP: PDMP reviewed during this encounter.       Pharmacotherapy: No side-effects or adverse reactions reported. Compliance: No problems identified. Effectiveness: Clinically acceptable.  UDS:  Summary  Date Value Ref Range Status  02/03/2021 Note  Final    Comment:    ==================================================================== [employer] [facility] (MW) ==================================================================== Test                             Result       Flag       Units  Drug Present and Declared for Prescription Verification   Oxycodone                      3705         EXPECTED   ng/mg creat   Oxymorphone                    2981         EXPECTED   ng/mg creat   Noroxycodone                   >6452  EXPECTED   ng/mg creat   Noroxymorphone                 1863         EXPECTED   ng/mg creat    Sources of oxycodone are scheduled prescription medications.    Oxymorphone, noroxycodone, and noroxymorphone are expected    metabolites of oxycodone. Oxymorphone is also available as a    scheduled prescription medication.  ==================================================================== Test                      Result    Flag   Units      Ref Range   Creatinine              155              mg/dL      >=20 ==================================================================== Declared Medications:  The flagging and interpretation on this  report are based on the  following declared medications.  Unexpected results may arise from  inaccuracies in the declared medications.   **Note: The testing scope of this panel includes these medications:   Oxycodone (Percocet)   **Note: The testing scope of this panel does not include the  following reported medications:   Acetaminophen (Tylenol)  Acetaminophen (Percocet)  Carisoprodol (Soma)  Digoxin (Lanoxin)  Duloxetine (Cymbalta)  Furosemide (Lasix)  Magnesium  Metoprolol (Toprol)  Naloxone (Narcan)  Prazosin (Minipress)  Rivaroxaban (Xarelto)  Rosuvastatin (Crestor) ==================================================================== For clinical consultation, please call [phone]. ====================================================================       ROS  Constitutional: Denies any fever or chills Gastrointestinal: No reported hemesis, hematochezia, vomiting, or acute GI distress Musculoskeletal:  thoracic and lumbar back pain Neurological: No reported episodes of acute onset apraxia, aphasia, dysarthria, agnosia, amnesia, paralysis, loss of coordination, or loss of consciousness  Medication Review  DULoxetine, Magnesium, acetaminophen, carisoprodol, naloxone, oxyCODONE-acetaminophen, prazosin, rivaroxaban, and rosuvastatin  History Review  Allergy: Aaron Short is allergic to diltiazem, penicillins, and tizanidine. Drug: Aaron Short  reports no history of drug use. Alcohol:  reports no history of alcohol use. Tobacco:  reports that he has been smoking cigarettes. He has been smoking an average of .5 packs per day. He has never used smokeless tobacco. Social: Aaron Short  reports that he has been smoking cigarettes. He has been smoking an average of .5 packs per day. He has never used smokeless tobacco. He reports that he does not drink alcohol and does not use drugs. Medical:  has a past medical history of Arthralgia of lower leg  (04/02/2012), Arthralgia of upper arm (06/18/2009), Cancer ([place]), Diabetes mellitus without complication ([place]), FH: atrial fibrillation, and Hypertension. Surgical: Aaron Short  has a past surgical history that includes bladder cancer surgery (2015); Joint replacement; Replacement total hip w/  resurfacing implants (Bilateral); Total knee arthroplasty (Bilateral); and Shoulder surgery (Bilateral). Family: family history includes Dementia in his mother; Heart disease in his father.  Laboratory Chemistry Profile   Renal Lab Results  Component Value Date   BUN 11 02/10/2019   CREATININE 0.85 02/10/2019   BCR 13 02/10/2019   GFRAA 99 02/10/2019   GFRNONAA 85 02/10/2019     Hepatic Lab Results  Component Value Date   AST 15 02/10/2019   ALT 17 11/18/2015   ALBUMIN 4.0 02/10/2019   ALKPHOS 89 02/10/2019     Electrolytes Lab Results  Component Value Date   NA 141 02/10/2019   K 4.9 02/10/2019   CL 102 02/10/2019   CALCIUM  9.4 02/10/2019   MG 1.9 02/10/2019     Bone Lab Results  Component Value Date   25OHVITD1 32 02/10/2019   25OHVITD2 <1.0 02/10/2019   25OHVITD3 32 02/10/2019     Inflammation (CRP: Acute Phase) (ESR: Chronic Phase) Lab Results  Component Value Date   CRP 7 02/10/2019   ESRSEDRATE 21 02/10/2019       Note: Above Lab results reviewed.  Recent Imaging Review  DG Lumbar Spine Complete W/Bend CLINICAL DATA:  Low back pain for 1 month radiating down LEFT leg, injured back lifting something heavy, lumbar spondylosis, lumbar facet syndrome  EXAM: LUMBAR SPINE - COMPLETE WITH BENDING VIEWS  COMPARISON:  11/18/2015  FINDINGS: Osseous demineralization.  Five non-rib-bearing lumbar vertebra.  Multilevel endplate spur formation throughout lumbar spine.  Minimal disc space narrowing L4-L5.  Vertebral body heights maintained without fracture or bone destruction.  Minimal retrolisthesis at L5-S1 approximately 3 mm which increases to 6 mm with  extension and appears unchanged with flexion.  Remaining alignments normal it out abnormal motion with flexion or extension.  SI joints preserved.  BILATERAL hip prostheses.  IMPRESSION: Scattered degenerative disc disease changes of the lumbar spine.  Minimal retrolisthesis at L5-S1 which mildly increases with extension and is unchanged with flexion.  Electronically Signed   By: Lavonia Dana M.D.   On: 11/08/2017 11:02  Note: Reviewed        Physical Exam  General appearance: Well nourished, well developed, and well hydrated. In no apparent acute distress Mental status: Alert, oriented x 3 (person, place, & time)       Respiratory: No evidence of acute respiratory distress Eyes: PERLA Vitals: BP 130/83    Pulse 77    Temp 98.3 F (36.8 C)    Resp 16    Ht '5\' 8"'  (1.727 m)    Wt 168 lb (76.2 kg)    SpO2 95%    BMI 25.54 kg/m  BMI: Estimated body mass index is 25.54 kg/m as calculated from the following:   Height as of this encounter: '5\' 8"'  (1.727 m).   Weight as of this encounter: 168 lb (76.2 kg). Ideal: Ideal body weight: 68.4 kg (150 lb 12.7 oz) Adjusted ideal body weight: 71.5 kg (157 lb 10.8 oz)  Thoracic Spine Area Exam  Skin & Axial Inspection: Significant thoracic kyphosis Alignment: Asymmetric Functional ROM: Pain restricted ROM Stability: No instability detected Muscle Tone/Strength: Functionally intact. No obvious neuro-muscular anomalies detected. Sensory (Neurological): Neurogenic pain pattern Muscle strength & Tone: No palpable anomalies Lumbar Spine Area Exam  Skin & Axial Inspection: Lumbar Scoliosis Alignment: Asymmetric Functional ROM: Pain restricted ROM       Stability: No instability detected Muscle Tone/Strength: Functionally intact. No obvious neuro-muscular anomalies detected. Sensory (Neurological): Neurogenic pain pattern Palpation: Complains of area being tender to palpation         Gait & Posture Assessment  Ambulation: Patient came in  today in a wheel chair Gait: Significantly limited. Dependent on assistive device to ambulate Posture: Difficulty standing up straight, due to pain  Lower Extremity Exam      Side: Right lower extremity   Side: Left lower extremity  Stability: No instability observed           Stability: No instability observed          Skin & Extremity Inspection: Skin color, temperature, and hair growth are WNL. No peripheral edema or cyanosis. No masses, redness, swelling, asymmetry, or associated skin lesions. No contractures.  Skin & Extremity Inspection: Skin color, temperature, and hair growth are WNL. No peripheral edema or cyanosis. No masses, redness, swelling, asymmetry, or associated skin lesions. No contractures.  Functional ROM: Pain restricted ROM for hip and knee joints           Functional ROM: Pain restricted ROM for hip and knee joints          Muscle Tone/Strength: Functionally intact. No obvious neuro-muscular anomalies detected.   Muscle Tone/Strength: Functionally intact. No obvious neuro-muscular anomalies detected.  Sensory (Neurological): Dermatomal pain pattern         Sensory (Neurological): Dermatomal pain pattern        DTR: Patellar: deferred today Achilles: deferred today Plantar: deferred today   DTR: Patellar: deferred today Achilles: deferred today Plantar: deferred today  Palpation: No palpable anomalies   Palpation: No palpable anomalies     Assessment   Status Diagnosis  Controlled Controlled Controlled 1. Osteoarthritis of shoulder (Bilateral) (L>R)   2. Chronic shoulder pain (Primary Area of Pain) (Bilateral) (L>R)   3. Chronic hip pain (Secondary area of Pain) (Bilateral) (R>L)   4. Chronic low back pain (Third area of Pain) (Bilateral) (R>L)   5. T12 compression fracture, sequela   6. Long term current use of opiate analgesic   7. Lumbar spondylosis   8. Chronic shoulder pain (Left)   9. Chronic pain syndrome        Plan of Care   Aaron Short has a current medication list which includes the following long-term medication(s): prazosin, rivaroxaban, duloxetine, narcan, [START ON 08/01/2021] oxycodone-acetaminophen, [START ON 08/31/2021] oxycodone-acetaminophen, and [START ON 09/30/2021] oxycodone-acetaminophen.  Continue with home physical therapy exercises   Pharmacotherapy (Medications Ordered): Meds ordered this encounter  Medications   oxyCODONE-acetaminophen (PERCOCET) 10-325 MG tablet    Sig: Take 1 tablet by mouth every 6 (six) hours as needed for pain.    Dispense:  120 tablet    Refill:  0    For chronic pain syndrome   oxyCODONE-acetaminophen (PERCOCET) 10-325 MG tablet    Sig: Take 1 tablet by mouth every 6 (six) hours as needed for pain.    Dispense:  120 tablet    Refill:  0    For chronic pain syndrome   oxyCODONE-acetaminophen (PERCOCET) 10-325 MG tablet    Sig: Take 1 tablet by mouth every 6 (six) hours as needed for pain.    Dispense:  120 tablet    Refill:  0    For chronic pain syndrome  Continue with Cymbalta Counseled/cautioned on Soma intake with concomitant opioid therapy as above.  Patient does have Narcan at home and has been on Soma for many years. Patient has prescription for Narcan  Follow-up plan:   Return in about 3 months (around 10/28/2021) for Medication Management, in person.   Recent Visits Date Type Provider Dept  05/03/21 Office Visit Gillis Santa, MD [facility]  Showing recent visits within past 90 days and meeting all other requirements Today's Visits Date Type Provider Dept  07/28/21 Office Visit Gillis Santa, MD [facility]  Showing today's visits and meeting all other requirements Future Appointments No visits were found meeting these conditions. Showing future appointments within next 90 days and meeting all other requirements  I discussed the assessment and treatment plan with the patient. The patient was provided an opportunity to ask questions  and all were answered. The patient agreed with the plan and demonstrated an understanding of the instructions.  Patient  advised to call back or seek an in-person evaluation if the symptoms or condition worsens.  Duration of encounter: 30 minutes.  Note by: Gillis Santa, MD Date: 07/28/2021; Time: 9:51 AM

## 2021-07-28 NOTE — Progress Notes (Signed)
Nursing Pain Medication Assessment:  ?Safety precautions to be maintained throughout the outpatient stay will include: orient to surroundings, keep bed in low position, maintain call bell within reach at all times, provide assistance with transfer out of bed and ambulation.  ?Medication Inspection Compliance: Pill count conducted under aseptic conditions, in front of the patient. Neither the pills nor the bottle was removed from the patient's sight at any time. Once count was completed pills were immediately returned to the patient in their original bottle. ? ?Medication: Oxycodone/APAP ?Pill/Patch Count:  7 of 120 pills remain ?Pill/Patch Appearance: Markings consistent with prescribed medication ?Bottle Appearance: Standard pharmacy container. Clearly labeled. ?Filled Date: 2 / 4 / 2023 ?Last Medication intake:  TodaySafety precautions to be maintained throughout the outpatient stay will include: orient to surroundings, keep bed in low position, maintain call bell within reach at all times, provide assistance with transfer out of bed and ambulation.  ?

## 2021-10-27 ENCOUNTER — Ambulatory Visit
Payer: Medicare Other | Attending: Student in an Organized Health Care Education/Training Program | Admitting: Student in an Organized Health Care Education/Training Program

## 2021-10-27 ENCOUNTER — Encounter: Payer: Self-pay | Admitting: Student in an Organized Health Care Education/Training Program

## 2021-10-27 VITALS — BP 135/69 | HR 62 | Temp 96.4°F | Resp 18 | Ht 68.0 in | Wt 154.0 lb

## 2021-10-27 DIAGNOSIS — G8929 Other chronic pain: Secondary | ICD-10-CM | POA: Diagnosis present

## 2021-10-27 DIAGNOSIS — M545 Low back pain, unspecified: Secondary | ICD-10-CM | POA: Insufficient documentation

## 2021-10-27 DIAGNOSIS — M25511 Pain in right shoulder: Secondary | ICD-10-CM | POA: Insufficient documentation

## 2021-10-27 DIAGNOSIS — G894 Chronic pain syndrome: Secondary | ICD-10-CM | POA: Diagnosis present

## 2021-10-27 DIAGNOSIS — M25559 Pain in unspecified hip: Secondary | ICD-10-CM | POA: Diagnosis not present

## 2021-10-27 DIAGNOSIS — Z79891 Long term (current) use of opiate analgesic: Secondary | ICD-10-CM | POA: Diagnosis present

## 2021-10-27 DIAGNOSIS — M19012 Primary osteoarthritis, left shoulder: Secondary | ICD-10-CM | POA: Diagnosis present

## 2021-10-27 DIAGNOSIS — M25512 Pain in left shoulder: Secondary | ICD-10-CM | POA: Insufficient documentation

## 2021-10-27 DIAGNOSIS — M19011 Primary osteoarthritis, right shoulder: Secondary | ICD-10-CM | POA: Insufficient documentation

## 2021-10-27 DIAGNOSIS — S22080S Wedge compression fracture of T11-T12 vertebra, sequela: Secondary | ICD-10-CM | POA: Diagnosis present

## 2021-10-27 MED ORDER — OXYCODONE-ACETAMINOPHEN 10-325 MG PO TABS: 1.0000 | ORAL_TABLET | Freq: Four times a day (QID) | ORAL | 0 refills | Status: DC | PRN

## 2021-10-27 NOTE — Progress Notes (Signed)
Patient: Aaron Short  Service Category: E/M  Provider: Gillis Santa, MD  DOB: [birth year]  DOS: 10/27/2021  Specialty: Interventional Pain Management  MRN: [id number]  Setting: Ambulatory outpatient  PCP: Neomia Dear, MD  Type: Established Patient    Referring Provider: Neomia Dear, MD  Location: Office  Delivery: Face-to-face     HPI  [name], a 75 y.o. year old male, is here today because of his Primary osteoarthritis of shoulders, bilateral [M19.011, M19.012]. [name] primary complain today is Shoulder Pain (left) and Back Pain (lower)  Last encounter: My last encounter with him was on 07/28/21  Pertinent problems: [name] has Long term current use of opiate analgesic; Long term prescription opiate use; Opioid dependence, daily use ([place]); Chronic hip pain (Secondary area of Pain) (Bilateral) (R>L); Chronic shoulder pain (Primary Area of Pain) (Bilateral) (L>R); Chronic low back pain (Third area of Pain) (Bilateral) (R>L); S/P THR: total hip replacement (Bilateral); Lumbar facet syndrome (Bilateral) (R>L); Lumbar spondylosis; Chronic pain syndrome; Chronic acromioclavicular joint pain (Left); Osteoarthritis of shoulder (Bilateral) (L>R); and T12 compression fracture, sequela on their pertinent problem list. Pain Assessment: Severity of Chronic pain is reported as a 4 /10. Location: Shoulder Left/left arm feels "asleep" sometimes. Onset: More than a month ago. Quality: Aaron Short Penning. Timing: Constant. Modifying factor(s): ice, heat, medications. Vitals:  height is '5\' 8"'  (1.727 m) and weight is 154 lb (69.9 kg). His temporal temperature is 96.4 F (35.8 C) (abnormal). His blood pressure is 135/69 and his pulse is 62. His respiration is 18 and oxygen saturation is 98%.   Reason for encounter: medication management.    No change in medical history since last visit.  Patient's pain is at baseline.  Patient continues multimodal pain regimen as  prescribed.  States that it provides pain relief and improvement in functional status. Recently returned from [place], visiting his grandson Has been cautioned on Soma use with oxycodone, has been on it long-term. Patient has Narcan at home   Pharmacotherapy Assessment  Analgesic:  Percocet 10 mg 4 times daily as needed, quantity 120/month given compression fractures resulting chronic pain.     Monitoring: [place] PMP: PDMP reviewed during this encounter.       Pharmacotherapy: No side-effects or adverse reactions reported. Compliance: No problems identified. Effectiveness: Clinically acceptable.  UDS:  Summary  Date Value Ref Range Status  02/03/2021 Note  Final    Comment:    ==================================================================== [employer] (MW) ==================================================================== Test                             Result       Flag       Units  Drug Present and Declared for Prescription Verification   Oxycodone                      3705         EXPECTED   ng/mg creat   Oxymorphone                    2981         EXPECTED   ng/mg creat   Noroxycodone                   >6452        EXPECTED   ng/mg creat   Noroxymorphone  1863         EXPECTED   ng/mg creat    Sources of oxycodone are scheduled prescription medications.    Oxymorphone, noroxycodone, and noroxymorphone are expected    metabolites of oxycodone. Oxymorphone is also available as a    scheduled prescription medication.  ==================================================================== Test                      Result    Flag   Units      Ref Range   Creatinine              155              mg/dL      >=20 ==================================================================== Declared Medications:  The flagging and interpretation on this report are based on the  following declared medications.  Unexpected results may arise from  inaccuracies in the  declared medications.   **Note: The testing scope of this panel includes these medications:   Oxycodone (Percocet)   **Note: The testing scope of this panel does not include the  following reported medications:   Acetaminophen (Tylenol)  Acetaminophen (Percocet)  Carisoprodol (Soma)  Digoxin (Lanoxin)  Duloxetine (Cymbalta)  Furosemide (Lasix)  Magnesium  Metoprolol (Toprol)  Naloxone (Narcan)  Prazosin (Minipress)  Rivaroxaban (Xarelto)  Rosuvastatin (Crestor) ==================================================================== For clinical consultation, please call [phone]. ====================================================================       ROS  Constitutional: Denies any fever or chills Gastrointestinal: No reported hemesis, hematochezia, vomiting, or acute GI distress Musculoskeletal:  thoracic and lumbar back pain Neurological: No reported episodes of acute onset apraxia, aphasia, dysarthria, agnosia, amnesia, paralysis, loss of coordination, or loss of consciousness  Medication Review  DULoxetine, Magnesium, acetaminophen, carisoprodol, naloxone, oxyCODONE-acetaminophen, prazosin, rivaroxaban, and rosuvastatin  History Review  Allergy: [name] is allergic to diltiazem, penicillins, and tizanidine. Drug: [name]  reports no history of drug use. Alcohol:  reports no history of alcohol use. Tobacco:  reports that he has been smoking cigarettes. He has been smoking an average of .5 packs per day. He has never used smokeless tobacco. Social: [name]  reports that he has been smoking cigarettes. He has been smoking an average of .5 packs per day. He has never used smokeless tobacco. He reports that he does not drink alcohol and does not use drugs. Medical:  has a past medical history of Arthralgia of lower leg (04/02/2012), Arthralgia of upper arm (06/18/2009), Cancer ([place]), Diabetes mellitus without complication ([place]), FH: atrial  fibrillation, and Hypertension. Surgical: [name]  has a past surgical history that includes bladder cancer surgery (2015); Joint replacement; Replacement total hip w/  resurfacing implants (Bilateral); Total knee arthroplasty (Bilateral); and Shoulder surgery (Bilateral). Family: family history includes Dementia in his mother; Heart disease in his father.  Laboratory Chemistry Profile   Renal Lab Results  Component Value Date   BUN 11 02/10/2019   CREATININE 0.85 02/10/2019   BCR 13 02/10/2019   GFRAA 99 02/10/2019   GFRNONAA 85 02/10/2019     Hepatic Lab Results  Component Value Date   AST 15 02/10/2019   ALT 17 11/18/2015   ALBUMIN 4.0 02/10/2019   ALKPHOS 89 02/10/2019     Electrolytes Lab Results  Component Value Date   NA 141 02/10/2019   K 4.9 02/10/2019   CL 102 02/10/2019   CALCIUM 9.4 02/10/2019   MG 1.9 02/10/2019     Bone Lab Results  Component Value Date   25OHVITD1 32 02/10/2019  25OHVITD2 <1.0 02/10/2019   25OHVITD3 32 02/10/2019     Inflammation (CRP: Acute Phase) (ESR: Chronic Phase) Lab Results  Component Value Date   CRP 7 02/10/2019   ESRSEDRATE 21 02/10/2019       Note: Above Lab results reviewed.  Recent Imaging Review  DG Lumbar Spine Complete W/Bend CLINICAL DATA:  Low back pain for 1 month radiating down LEFT leg, injured back lifting something heavy, lumbar spondylosis, lumbar facet syndrome  EXAM: LUMBAR SPINE - COMPLETE WITH BENDING VIEWS  COMPARISON:  11/18/2015  FINDINGS: Osseous demineralization.  Five non-rib-bearing lumbar vertebra.  Multilevel endplate spur formation throughout lumbar spine.  Minimal disc space narrowing L4-L5.  Vertebral body heights maintained without fracture or bone destruction.  Minimal retrolisthesis at L5-S1 approximately 3 mm which increases to 6 mm with extension and appears unchanged with flexion.  Remaining alignments normal it out abnormal motion with flexion  or extension.  SI joints preserved.  BILATERAL hip prostheses.  IMPRESSION: Scattered degenerative disc disease changes of the lumbar spine.  Minimal retrolisthesis at L5-S1 which mildly increases with extension and is unchanged with flexion.  Electronically Signed   By: Lavonia Dana M.D.   On: 11/08/2017 11:02  Note: Reviewed        Physical Exam  General appearance: Well nourished, well developed, and well hydrated. In no apparent acute distress Mental status: Alert, oriented x 3 (person, place, & time)       Respiratory: No evidence of acute respiratory distress Eyes: PERLA Vitals: BP 135/69   Pulse 62   Temp (!) 96.4 F (35.8 C) (Temporal)   Resp 18   Ht '5\' 8"'  (1.727 m)   Wt 154 lb (69.9 kg)   SpO2 98%   BMI 23.42 kg/m  BMI: Estimated body mass index is 23.42 kg/m as calculated from the following:   Height as of this encounter: '5\' 8"'  (1.727 m).   Weight as of this encounter: 154 lb (69.9 kg). Ideal: Ideal body weight: 68.4 kg (150 lb 12.7 oz) Adjusted ideal body weight: 69 kg (152 lb 1.2 oz)  Thoracic Spine Area Exam  Skin & Axial Inspection: Significant thoracic kyphosis Alignment: Asymmetric Functional ROM: Pain restricted ROM Stability: No instability detected Muscle Tone/Strength: Functionally intact. No obvious neuro-muscular anomalies detected. Sensory (Neurological): Neurogenic pain pattern Muscle strength & Tone: No palpable anomalies Lumbar Spine Area Exam  Skin & Axial Inspection: Lumbar Scoliosis Alignment: Asymmetric Functional ROM: Pain restricted ROM       Stability: No instability detected Muscle Tone/Strength: Functionally intact. No obvious neuro-muscular anomalies detected. Sensory (Neurological): Neurogenic pain pattern Palpation: Complains of area being tender to palpation         Gait & Posture Assessment  Ambulation: Patient came in today in a wheel chair Gait: Significantly limited. Dependent on assistive device to  ambulate Posture: Difficulty standing up straight, due to pain  Lower Extremity Exam      Side: Right lower extremity   Side: Left lower extremity  Stability: No instability observed           Stability: No instability observed          Skin & Extremity Inspection: Skin color, temperature, and hair growth are WNL. No peripheral edema or cyanosis. No masses, redness, swelling, asymmetry, or associated skin lesions. No contractures.   Skin & Extremity Inspection: Skin color, temperature, and hair growth are WNL. No peripheral edema or cyanosis. No masses, redness, swelling, asymmetry, or associated skin lesions. No contractures.  Functional  ROM: Pain restricted ROM for hip and knee joints           Functional ROM: Pain restricted ROM for hip and knee joints          Muscle Tone/Strength: Functionally intact. No obvious neuro-muscular anomalies detected.   Muscle Tone/Strength: Functionally intact. No obvious neuro-muscular anomalies detected.  Sensory (Neurological): Dermatomal pain pattern         Sensory (Neurological): Dermatomal pain pattern        DTR: Patellar: deferred today Achilles: deferred today Plantar: deferred today   DTR: Patellar: deferred today Achilles: deferred today Plantar: deferred today  Palpation: No palpable anomalies   Palpation: No palpable anomalies     Assessment   Status Diagnosis  Controlled Controlled Controlled 1. Osteoarthritis of shoulder (Bilateral) (L>R)   2. Chronic shoulder pain (Primary Area of Pain) (Bilateral) (L>R)   3. Chronic hip pain (Secondary area of Pain) (Bilateral) (R>L)   4. Chronic low back pain (Third area of Pain) (Bilateral) (R>L)   5. T12 compression fracture, sequela   6. Long term current use of opiate analgesic   7. Chronic pain syndrome        Plan of Care   [name] has a current medication list which includes the following long-term medication(s): prazosin, rivaroxaban, duloxetine, narcan, [START ON  10/30/2021] oxycodone-acetaminophen, [START ON 11/29/2021] oxycodone-acetaminophen, and [START ON 12/29/2021] oxycodone-acetaminophen.  Continue with home physical therapy exercises   Pharmacotherapy (Medications Ordered): Meds ordered this encounter  Medications   oxyCODONE-acetaminophen (PERCOCET) 10-325 MG tablet    Sig: Take 1 tablet by mouth every 6 (six) hours as needed for pain.    Dispense:  120 tablet    Refill:  0    For chronic pain syndrome   oxyCODONE-acetaminophen (PERCOCET) 10-325 MG tablet    Sig: Take 1 tablet by mouth every 6 (six) hours as needed for pain. If pharmacy is closed July 4, ok to pick up July 3rd    Dispense:  120 tablet    Refill:  0    For chronic pain syndrome   oxyCODONE-acetaminophen (PERCOCET) 10-325 MG tablet    Sig: Take 1 tablet by mouth every 6 (six) hours as needed for pain. If pharmacy is closed July 4, ok to pick up July 3rd    Dispense:  120 tablet    Refill:  0    For chronic pain syndrome  Continue with Cymbalta Counseled/cautioned on Soma intake with concomitant opioid therapy as above.  Patient does have Narcan at home and has been on Soma for many years. Patient has prescription for Narcan  Follow-up plan:   Return in about 3 months (around 01/27/2022) for Medication Management, in person.   Recent Visits No visits were found meeting these conditions. Showing recent visits within past 90 days and meeting all other requirements Today's Visits Date Type Provider Dept  10/27/21 Office Visit Gillis Santa, MD [facility]  Showing today's visits and meeting all other requirements Future Appointments Date Type Provider Dept  01/19/22 Appointment Gillis Santa, MD [facility]  Showing future appointments within next 90 days and meeting all other requirements  I discussed the assessment and treatment plan with the patient. The patient was provided an opportunity to ask questions and all were answered. The patient agreed  with the plan and demonstrated an understanding of the instructions.  Patient advised to call back or seek an in-person evaluation if the symptoms or condition worsens.  Duration of encounter:  30 minutes.  Note by: Gillis Santa, MD Date: 10/27/2021; Time: 11:05 AM

## 2021-10-27 NOTE — Progress Notes (Signed)
Nursing Pain Medication Assessment:  Safety precautions to be maintained throughout the outpatient stay will include: orient to surroundings, keep bed in low position, maintain call bell within reach at all times, provide assistance with transfer out of bed and ambulation.  Medication Inspection Compliance: Pill count conducted under aseptic conditions, in front of the patient. Neither the pills nor the bottle was removed from the patient's sight at any time. Once count was completed pills were immediately returned to the patient in their original bottle.  Medication: Oxycodone/APAP Pill/Patch Count:  10 of 120 pills remain Pill/Patch Appearance: Markings consistent with prescribed medication Bottle Appearance: Standard pharmacy container. Clearly labeled. Filled Date: 05 / 05 / 2023 Last Medication intake:  Today Safety precautions to be maintained throughout the outpatient stay will include: orient to surroundings, keep bed in low position, maintain call bell within reach at all times, provide assistance with transfer out of bed and ambulation.

## 2021-11-28 ENCOUNTER — Telehealth: Payer: Self-pay | Admitting: Student in an Organized Health Care Education/Training Program

## 2021-11-28 ENCOUNTER — Other Ambulatory Visit: Payer: Self-pay | Admitting: *Deleted

## 2021-11-28 DIAGNOSIS — G894 Chronic pain syndrome: Secondary | ICD-10-CM

## 2021-11-28 DIAGNOSIS — M19011 Primary osteoarthritis, right shoulder: Secondary | ICD-10-CM

## 2021-11-28 DIAGNOSIS — S22080S Wedge compression fracture of T11-T12 vertebra, sequela: Secondary | ICD-10-CM

## 2021-11-28 DIAGNOSIS — Z79891 Long term (current) use of opiate analgesic: Secondary | ICD-10-CM

## 2021-11-28 MED ORDER — OXYCODONE-ACETAMINOPHEN 10-325 MG PO TABS: 1.0000 | ORAL_TABLET | Freq: Four times a day (QID) | ORAL | 0 refills | Status: DC | PRN

## 2021-11-28 NOTE — Telephone Encounter (Signed)
Rx request sent to [facility].

## 2021-11-28 NOTE — Telephone Encounter (Signed)
Patient's [facility] in [place] does not have his percocet. He called [facility] and they have enough to fill. Please see if Dr Delane Ginger or Dr. Loni Muse can do this for him.

## 2021-12-20 ENCOUNTER — Telehealth: Payer: Self-pay

## 2021-12-20 NOTE — Telephone Encounter (Signed)
noted 

## 2021-12-20 NOTE — Telephone Encounter (Signed)
He fell and broke his ribs, went to [facility] they gave him 20 - 5 mg oxycodone. Just wanted to let you know.

## 2022-01-19 ENCOUNTER — Encounter: Payer: Self-pay | Admitting: Student in an Organized Health Care Education/Training Program

## 2022-01-19 ENCOUNTER — Other Ambulatory Visit: Payer: Self-pay

## 2022-01-19 ENCOUNTER — Ambulatory Visit
Payer: Medicare Other | Attending: Student in an Organized Health Care Education/Training Program | Admitting: Student in an Organized Health Care Education/Training Program

## 2022-01-19 VITALS — BP 121/63 | HR 60 | Temp 97.3°F | Resp 16 | Ht 68.0 in | Wt 154.0 lb

## 2022-01-19 DIAGNOSIS — R0781 Pleurodynia: Secondary | ICD-10-CM

## 2022-01-19 DIAGNOSIS — S2231XD Fracture of one rib, right side, subsequent encounter for fracture with routine healing: Secondary | ICD-10-CM

## 2022-01-19 DIAGNOSIS — S22080S Wedge compression fracture of T11-T12 vertebra, sequela: Secondary | ICD-10-CM | POA: Insufficient documentation

## 2022-01-19 DIAGNOSIS — M19012 Primary osteoarthritis, left shoulder: Secondary | ICD-10-CM | POA: Diagnosis present

## 2022-01-19 DIAGNOSIS — Z79891 Long term (current) use of opiate analgesic: Secondary | ICD-10-CM | POA: Diagnosis not present

## 2022-01-19 DIAGNOSIS — M19011 Primary osteoarthritis, right shoulder: Secondary | ICD-10-CM | POA: Diagnosis present

## 2022-01-19 DIAGNOSIS — G894 Chronic pain syndrome: Secondary | ICD-10-CM | POA: Insufficient documentation

## 2022-01-19 MED ORDER — OXYCODONE-ACETAMINOPHEN 10-325 MG PO TABS: 1.0000 | ORAL_TABLET | Freq: Four times a day (QID) | ORAL | 0 refills | Status: DC | PRN

## 2022-01-19 NOTE — Progress Notes (Signed)
Nursing Pain Medication Assessment:  Safety precautions to be maintained throughout the outpatient stay will include: orient to surroundings, keep bed in low position, maintain call bell within reach at all times, provide assistance with transfer out of bed and ambulation.  Medication Inspection Compliance: Pill count conducted under aseptic conditions, in front of the patient. Neither the pills nor the bottle was removed from the patient's sight at any time. Once count was completed pills were immediately returned to the patient in their original bottle.  Medication: Oxycodone/APAP Pill/Patch Count:  18 of 115 pills remain Pill/Patch Appearance: Markings consistent with prescribed medication Bottle Appearance: Standard pharmacy container. Clearly labeled. Filled Date: 08 / 02 / 2023 Last Medication intake:  Today

## 2022-01-19 NOTE — Patient Instructions (Signed)

## 2022-01-19 NOTE — Progress Notes (Signed)
Patient: Aaron Short  Service Category: E/M  Provider: Gillis Santa, MD  DOB: 03-Feb-1946  DOS: 01/19/2022  Specialty: Interventional Pain Management  MRN: [id number]  Setting: Ambulatory outpatient  PCP: Neomia Dear, MD  Type: Established Patient    Referring Provider: Neomia Dear, MD  Location: Office  Delivery: Face-to-face     HPI  Mr. Richar Dunklee, a 76 y.o. year old male, is here today because of his Rib pain on right side [R07.81]. Mr. Bonelli primary complain today is Shoulder Pain (Left ) and Back Pain (Lumbar bilateral )  Last encounter: My last encounter with him was on 10/27/2021  Pertinent problems: Mr. Gautreau has Long term current use of opiate analgesic; Long term prescription opiate use; Opioid dependence, daily use ([place]); Chronic hip pain (Secondary area of Pain) (Bilateral) (R>L); Chronic shoulder pain (Primary Area of Pain) (Bilateral) (L>R); Chronic low back pain (Third area of Pain) (Bilateral) (R>L); S/P THR: total hip replacement (Bilateral); Lumbar facet syndrome (Bilateral) (R>L); Lumbar spondylosis; Chronic pain syndrome; Chronic acromioclavicular joint pain (Left); Osteoarthritis of shoulder (Bilateral) (L>R); and T12 compression fracture, sequela on their pertinent problem list. Pain Assessment: Severity of Chronic pain is reported as a 4 /10. Location: Shoulder (back) Left (bilateral lumbar)/occasionally back pain will radiate down both legs. Onset: More than a month ago. Quality: Discomfort, Constant, Dull, Aching, Stabbing. Timing: Constant (back pain is intermittent, worse in the a.m. upon rising  sometimes that will radiate down both legs). Modifying factor(s): heat/ice. medications. Vitals:  height is '5\' 8"'  (1.727 m) and weight is 154 lb (69.9 kg). His temporal temperature is 97.3 F (36.3 C) (abnormal). His blood pressure is 121/63 and his pulse is 60. His respiration is 16 and oxygen saturation is 99%.   Reason for encounter:  medication management.    Unfortunately, patient had a fall where he fractured his right 11th rib.  He was prescribed oxycodone by urgent care and has taken extra oxycodone that is prescribed for his chronic pain given his acute pain from his right rib fractures. He states that the pain is improving but it is still greater than his overall baseline pain.  We discussed potential intercostal nerve blocks if pain persist.  No opioid escalation but we will refill his oxycodone 3 days earlier given his acute on chronic pain.  We will renew our annual urine toxicology screen as well.  Pharmacotherapy Assessment  Analgesic:  Percocet 10 mg 4 times daily as needed, quantity 120/month given compression fractures resulting chronic pain.     Monitoring: [place] PMP: PDMP reviewed during this encounter.       Pharmacotherapy: No side-effects or adverse reactions reported. Compliance: No problems identified. Effectiveness: Clinically acceptable.  UDS:  Summary  Date Value Ref Range Status  02/03/2021 Note  Final    Comment:    ==================================================================== [facility] (MW) ==================================================================== Test                             Result       Flag       Units  Drug Present and Declared for Prescription Verification   Oxycodone                      3705         EXPECTED   ng/mg creat   Oxymorphone  2981         EXPECTED   ng/mg creat   Noroxycodone                   >6452        EXPECTED   ng/mg creat   Noroxymorphone                 1863         EXPECTED   ng/mg creat    Sources of oxycodone are scheduled prescription medications.    Oxymorphone, noroxycodone, and noroxymorphone are expected    metabolites of oxycodone. Oxymorphone is also available as a    scheduled prescription medication.  ==================================================================== Test                      Result     Flag   Units      Ref Range   Creatinine              155              mg/dL      >=20 ==================================================================== Declared Medications:  The flagging and interpretation on this report are based on the  following declared medications.  Unexpected results may arise from  inaccuracies in the declared medications.   **Note: The testing scope of this panel includes these medications:   Oxycodone (Percocet)   **Note: The testing scope of this panel does not include the  following reported medications:   Acetaminophen (Tylenol)  Acetaminophen (Percocet)  Carisoprodol (Soma)  Digoxin (Lanoxin)  Duloxetine (Cymbalta)  Furosemide (Lasix)  Magnesium  Metoprolol (Toprol)  Naloxone (Narcan)  Prazosin (Minipress)  Rivaroxaban (Xarelto)  Rosuvastatin (Crestor) ==================================================================== For clinical consultation, please call [phone]. ====================================================================       ROS  Constitutional: Denies any fever or chills Gastrointestinal: No reported hemesis, hematochezia, vomiting, or acute GI distress Musculoskeletal:  Right rib thoracic and lumbar back pain Neurological: No reported episodes of acute onset apraxia, aphasia, dysarthria, agnosia, amnesia, paralysis, loss of coordination, or loss of consciousness  Medication Review  DULoxetine, Magnesium, acetaminophen, carisoprodol, melatonin, metoprolol succinate, naloxone, oxyCODONE-acetaminophen, prazosin, rivaroxaban, and rosuvastatin  History Review  Allergy: Mr. Haubner is allergic to diltiazem, penicillins, and tizanidine. Drug: Mr. Balestrieri  reports no history of drug use. Alcohol:  reports no history of alcohol use. Tobacco:  reports that he has been smoking cigarettes. He has been smoking an average of .5 packs per day. He has never used smokeless tobacco. Social: Mr. Ethington  reports  that he has been smoking cigarettes. He has been smoking an average of .5 packs per day. He has never used smokeless tobacco. He reports that he does not drink alcohol and does not use drugs. Medical:  has a past medical history of Arthralgia of lower leg (04/02/2012), Arthralgia of upper arm (06/18/2009), Cancer ([place]), Diabetes mellitus without complication ([place]), FH: atrial fibrillation, and Hypertension. Surgical: Mr. Goodner  has a past surgical history that includes bladder cancer surgery (2015); Joint replacement; Replacement total hip w/  resurfacing implants (Bilateral); Total knee arthroplasty (Bilateral); and Shoulder surgery (Bilateral). Family: family history includes Dementia in his mother; Heart disease in his father.  Laboratory Chemistry Profile   Renal Lab Results  Component Value Date   BUN 11 02/10/2019   CREATININE 0.85 02/10/2019   BCR 13 02/10/2019   GFRAA 99 02/10/2019   GFRNONAA 85 02/10/2019     Hepatic Lab Results  Component Value Date  AST 15 02/10/2019   ALT 17 11/18/2015   ALBUMIN 4.0 02/10/2019   ALKPHOS 89 02/10/2019     Electrolytes Lab Results  Component Value Date   NA 141 02/10/2019   K 4.9 02/10/2019   CL 102 02/10/2019   CALCIUM 9.4 02/10/2019   MG 1.9 02/10/2019     Bone Lab Results  Component Value Date   25OHVITD1 32 02/10/2019   25OHVITD2 <1.0 02/10/2019   25OHVITD3 32 02/10/2019     Inflammation (CRP: Acute Phase) (ESR: Chronic Phase) Lab Results  Component Value Date   CRP 7 02/10/2019   ESRSEDRATE 21 02/10/2019       Note: Above Lab results reviewed.  Recent Imaging Review  DG Lumbar Spine Complete W/Bend CLINICAL DATA:  Low back pain for 1 month radiating down LEFT leg, injured back lifting something heavy, lumbar spondylosis, lumbar facet syndrome  EXAM: LUMBAR SPINE - COMPLETE WITH BENDING VIEWS  COMPARISON:  11/18/2015  FINDINGS: Osseous demineralization.  Five non-rib-bearing lumbar  vertebra.  Multilevel endplate spur formation throughout lumbar spine.  Minimal disc space narrowing L4-L5.  Vertebral body heights maintained without fracture or bone destruction.  Minimal retrolisthesis at L5-S1 approximately 3 mm which increases to 6 mm with extension and appears unchanged with flexion.  Remaining alignments normal it out abnormal motion with flexion or extension.  SI joints preserved.  BILATERAL hip prostheses.  IMPRESSION: Scattered degenerative disc disease changes of the lumbar spine.  Minimal retrolisthesis at L5-S1 which mildly increases with extension and is unchanged with flexion.  Electronically Signed   By: Lavonia Dana M.D.   On: 11/08/2017 11:02  Note: Reviewed        Physical Exam  General appearance: Well nourished, well developed, and well hydrated. In no apparent acute distress Mental status: Alert, oriented x 3 (person, place, & time)       Respiratory: No evidence of acute respiratory distress Eyes: PERLA Vitals: BP 121/63 (BP Location: Left Arm, Patient Position: Sitting, Cuff Size: Normal)   Pulse 60   Temp (!) 97.3 F (36.3 C) (Temporal)   Resp 16   Ht '5\' 8"'  (1.727 m)   Wt 154 lb (69.9 kg)   SpO2 99%   BMI 23.42 kg/m  BMI: Estimated body mass index is 23.42 kg/m as calculated from the following:   Height as of this encounter: '5\' 8"'  (1.727 m).   Weight as of this encounter: 154 lb (69.9 kg). Ideal: Ideal body weight: 68.4 kg (150 lb 12.7 oz) Adjusted ideal body weight: 69 kg (152 lb 1.2 oz)  Thoracic Spine Area Exam  Skin & Axial Inspection: Significant thoracic kyphosis Alignment: Asymmetric Functional ROM: Pain restricted ROM Stability: No instability detected Muscle Tone/Strength: Functionally intact. No obvious neuro-muscular anomalies detected. Sensory (Neurological): Neurogenic pain pattern, right rib pain Muscle strength & Tone: No palpable anomalies Lumbar Spine Area Exam  Skin & Axial Inspection: Lumbar  Scoliosis Alignment: Asymmetric Functional ROM: Pain restricted ROM       Stability: No instability detected Muscle Tone/Strength: Functionally intact. No obvious neuro-muscular anomalies detected. Sensory (Neurological): Neurogenic pain pattern Palpation: Complains of area being tender to palpation         Gait & Posture Assessment  Ambulation: Patient came in today in a wheel chair Gait: Significantly limited. Dependent on assistive device to ambulate Posture: Difficulty standing up straight, due to pain  Lower Extremity Exam      Side: Right lower extremity   Side: Left lower extremity  Stability: No  instability observed           Stability: No instability observed          Skin & Extremity Inspection: Skin color, temperature, and hair growth are WNL. No peripheral edema or cyanosis. No masses, redness, swelling, asymmetry, or associated skin lesions. No contractures.   Skin & Extremity Inspection: Skin color, temperature, and hair growth are WNL. No peripheral edema or cyanosis. No masses, redness, swelling, asymmetry, or associated skin lesions. No contractures.  Functional ROM: Pain restricted ROM for hip and knee joints           Functional ROM: Pain restricted ROM for hip and knee joints          Muscle Tone/Strength: Functionally intact. No obvious neuro-muscular anomalies detected.   Muscle Tone/Strength: Functionally intact. No obvious neuro-muscular anomalies detected.  Sensory (Neurological): Dermatomal pain pattern         Sensory (Neurological): Dermatomal pain pattern        DTR: Patellar: deferred today Achilles: deferred today Plantar: deferred today   DTR: Patellar: deferred today Achilles: deferred today Plantar: deferred today  Palpation: No palpable anomalies   Palpation: No palpable anomalies     Assessment   Status Diagnosis  Persistent Persistent Controlled 1. Rib pain on right side   2. Osteoarthritis of shoulder (Bilateral) (L>R)   3. T12 compression  fracture, sequela   4. Long term current use of opiate analgesic   5. Chronic pain syndrome   6. Closed fracture of one rib of right side with routine healing, subsequent encounter         Plan of Care   Mr. Tremaine Earwood has a current medication list which includes the following long-term medication(s): duloxetine, narcan, rivaroxaban, [START ON 01/23/2022] oxycodone-acetaminophen, [START ON 02/22/2022] oxycodone-acetaminophen, [START ON 03/24/2022] oxycodone-acetaminophen, and prazosin.  Continue with home physical therapy exercises   Pharmacotherapy (Medications Ordered): Meds ordered this encounter  Medications   oxyCODONE-acetaminophen (PERCOCET) 10-325 MG tablet    Sig: Take 1 tablet by mouth every 6 (six) hours as needed for pain.    Dispense:  120 tablet    Refill:  0    For chronic pain syndrome   oxyCODONE-acetaminophen (PERCOCET) 10-325 MG tablet    Sig: Take 1 tablet by mouth every 6 (six) hours as needed for pain.    Dispense:  120 tablet    Refill:  0    For chronic pain syndrome   oxyCODONE-acetaminophen (PERCOCET) 10-325 MG tablet    Sig: Take 1 tablet by mouth every 6 (six) hours as needed for pain.    Dispense:  120 tablet    Refill:  0    For chronic pain syndrome  Discussed intercostal nerve block if right rib pain due to 11th right rib fracture worsens. Continue with Cymbalta Counseled/cautioned on Soma intake with concomitant opioid therapy as above.  Patient does have Narcan at home and has been on Soma for many years. Patient has prescription for Narcan  Follow-up plan:   Return in about 3 months (around 04/21/2022) for Medication Management, in person.   Recent Visits Date Type Provider Dept  10/27/21 Office Visit Gillis Santa, MD [facility]  Showing recent visits within past 90 days and meeting all other requirements Today's Visits Date Type Provider Dept  01/19/22 Office Visit Gillis Santa, MD [facility]  Showing  today's visits and meeting all other requirements Future Appointments Date Type Provider Dept  04/13/22 Appointment Gillis Santa, MD [facility]  Showing future appointments within next 90 days and meeting all other requirements  I discussed the assessment and treatment plan with the patient. The patient was provided an opportunity to ask questions and all were answered. The patient agreed with the plan and demonstrated an understanding of the instructions.  Patient advised to call back or seek an in-person evaluation if the symptoms or condition worsens.  Duration of encounter: 30 minutes.  Note by: Gillis Santa, MD Date: 01/19/2022; Time: 11:10 AM

## 2022-01-24 LAB — TOXASSURE SELECT 13 (MW), URINE

## 2022-03-20 DIAGNOSIS — J189 Pneumonia, unspecified organism: Secondary | ICD-10-CM | POA: Insufficient documentation

## 2022-03-23 DIAGNOSIS — K921 Melena: Secondary | ICD-10-CM | POA: Insufficient documentation

## 2022-04-13 ENCOUNTER — Ambulatory Visit
Payer: Medicare Other | Attending: Student in an Organized Health Care Education/Training Program | Admitting: Student in an Organized Health Care Education/Training Program

## 2022-04-13 ENCOUNTER — Encounter: Payer: Self-pay | Admitting: Student in an Organized Health Care Education/Training Program

## 2022-04-13 DIAGNOSIS — S22080S Wedge compression fracture of T11-T12 vertebra, sequela: Secondary | ICD-10-CM | POA: Diagnosis not present

## 2022-04-13 DIAGNOSIS — M19011 Primary osteoarthritis, right shoulder: Secondary | ICD-10-CM | POA: Diagnosis not present

## 2022-04-13 DIAGNOSIS — Z79891 Long term (current) use of opiate analgesic: Secondary | ICD-10-CM | POA: Insufficient documentation

## 2022-04-13 DIAGNOSIS — G894 Chronic pain syndrome: Secondary | ICD-10-CM | POA: Diagnosis present

## 2022-04-13 DIAGNOSIS — M19012 Primary osteoarthritis, left shoulder: Secondary | ICD-10-CM | POA: Insufficient documentation

## 2022-04-13 MED ORDER — OXYCODONE-ACETAMINOPHEN 10-325 MG PO TABS: 1.0000 | ORAL_TABLET | Freq: Four times a day (QID) | ORAL | 0 refills | Status: DC | PRN

## 2022-04-13 NOTE — Progress Notes (Signed)
Patient: Aaron Short  Service Category: E/M  Provider: Gillis Santa, MD  DOB: 1946/04/07  DOS: 04/13/2022  Specialty: Interventional Pain Management  MRN: [id number]  Setting: Ambulatory outpatient  PCP: Neomia Dear, MD  Type: Established Patient    Referring Provider: Neomia Dear, MD  Location: Office  Delivery: Face-to-face     HPI  [name], a 76 y.o. year old male, is here today because of his No primary diagnosis found.. [name] primary complain today is Back Pain (Lumbar bilateral ), Shoulder Pain (Left ), and Foot Pain (Bilateral into arches)  Last encounter: My last encounter with him was on 01/19/22  Pertinent problems: [name] has Long term current use of opiate analgesic; Long term prescription opiate use; Opioid dependence, daily use ([place]); Chronic hip pain (Secondary area of Pain) (Bilateral) (R>L); Chronic shoulder pain (Primary Area of Pain) (Bilateral) (L>R); Chronic low back pain (Third area of Pain) (Bilateral) (R>L); S/P THR: total hip replacement (Bilateral); Lumbar facet syndrome (Bilateral) (R>L); Lumbar spondylosis; Chronic pain syndrome; Chronic acromioclavicular joint pain (Left); Osteoarthritis of shoulder (Bilateral) (L>R); and T12 compression fracture, sequela on their pertinent problem list. Pain Assessment: Severity of Chronic pain is reported as a 1 /10. Location: Back (see visit info for additional pain sites.) Lower, Left, Right/Denies. Onset: More than a month ago. Quality: Discomfort, Constant, Throbbing, Sore, Dull, Sharp. Timing: Constant. Modifying factor(s): medications. Vitals:  height is _0  (1.727 m) and weight is 142 lb (64.4 kg). His temperature is 97.2 F (36.2 C) (abnormal). His blood pressure is 118/54 (abnormal) and his pulse is 80. His oxygen saturation is 100%.   Reason for encounter: medication management.    No change in medical history since last visit.  Patient's pain is at baseline.   Patient continues multimodal pain regimen as prescribed.  States that it provides pain relief and improvement in functional status. Patient states that he has lost over 25 pounds in the last 3 to 4 months.  This is unintentional weight loss.  He states that he does not have much of a diet.  I informed him to discuss this with his primary care provider.  Pharmacotherapy Assessment  Analgesic:  Percocet 10 mg 4 times daily as needed, quantity 120/month given compression fractures resulting chronic pain.     Monitoring: [place] PMP: PDMP reviewed during this encounter.       Pharmacotherapy: No side-effects or adverse reactions reported. Compliance: No problems identified. Effectiveness: Clinically acceptable.  UDS:  Summary  Date Value Ref Range Status  01/19/2022 Note  Final    Comment:    ==================================================================== [facility] (MW) ==================================================================== Test                             Result       Flag       Units  Drug Present and Declared for Prescription Verification   Oxycodone                      2818         EXPECTED   ng/mg creat   Oxymorphone                    6603         EXPECTED   ng/mg creat   Noroxycodone                   7501  EXPECTED   ng/mg creat   Noroxymorphone                 2382         EXPECTED   ng/mg creat    Sources of oxycodone are scheduled prescription medications.    Oxymorphone, noroxycodone, and noroxymorphone are expected    metabolites of oxycodone. Oxymorphone is also available as a    scheduled prescription medication.  ==================================================================== Test                      Result    Flag   Units      Ref Range   Creatinine              101              mg/dL      >=20 ==================================================================== Declared Medications:  The flagging and interpretation on this report  are based on the  following declared medications.  Unexpected results may arise from  inaccuracies in the declared medications.   **Note: The testing scope of this panel includes these medications:   Oxycodone (Percocet)   **Note: The testing scope of this panel does not include the  following reported medications:   Acetaminophen (Tylenol)  Acetaminophen (Percocet)  Carisoprodol (Soma)  Duloxetine (Cymbalta)  Magnesium  Melatonin  Metoprolol  Naloxone (Narcan)  Prazosin (Minipress)  Rivaroxaban (Xarelto)  Rosuvastatin (Crestor) ==================================================================== For clinical consultation, please call [phone]. ====================================================================       ROS  Constitutional: Denies any fever or chills Gastrointestinal: No reported hemesis, hematochezia, vomiting, or acute GI distress Musculoskeletal:  lumbar back pain Neurological: No reported episodes of acute onset apraxia, aphasia, dysarthria, agnosia, amnesia, paralysis, loss of coordination, or loss of consciousness  Medication Review  Cetirizine HCl, DULoxetine, Magnesium, Vitamin B + C Complex, acetaminophen, carisoprodol, melatonin, metoprolol succinate, naloxone, oxyCODONE-acetaminophen, prazosin, rivaroxaban, and rosuvastatin  History Review  Allergy: [name] is allergic to diltiazem, penicillins, and tizanidine. Drug: [name]  reports no history of drug use. Alcohol:  reports no history of alcohol use. Tobacco:  reports that he has quit smoking. His smoking use included cigarettes. He smoked an average of .5 packs per day. He has never used smokeless tobacco. Social: [name]  reports that he has quit smoking. His smoking use included cigarettes. He smoked an average of .5 packs per day. He has never used smokeless tobacco. He reports that he does not drink alcohol and does not use drugs. Medical:  has a past medical  history of Arthralgia of lower leg (04/02/2012), Arthralgia of upper arm (06/18/2009), Cancer ([place]), Diabetes mellitus without complication ([place]), FH: atrial fibrillation, and Hypertension. Surgical: [name]  has a past surgical history that includes bladder cancer surgery (2015); Joint replacement; Replacement total hip w/  resurfacing implants (Bilateral); Total knee arthroplasty (Bilateral); and Shoulder surgery (Bilateral). Family: family history includes Dementia in his mother; Heart disease in his father.  Laboratory Chemistry Profile   Renal Lab Results  Component Value Date   BUN 11 02/10/2019   CREATININE 0.85 02/10/2019   BCR 13 02/10/2019   GFRAA 99 02/10/2019   GFRNONAA 85 02/10/2019     Hepatic Lab Results  Component Value Date   AST 15 02/10/2019   ALT 17 11/18/2015   ALBUMIN 4.0 02/10/2019   ALKPHOS 89 02/10/2019     Electrolytes Lab Results  Component Value Date   NA 141 02/10/2019   K 4.9 02/10/2019  CL 102 02/10/2019   CALCIUM 9.4 02/10/2019   MG 1.9 02/10/2019     Bone Lab Results  Component Value Date   25OHVITD1 32 02/10/2019   25OHVITD2 <1.0 02/10/2019   25OHVITD3 32 02/10/2019     Inflammation (CRP: Acute Phase) (ESR: Chronic Phase) Lab Results  Component Value Date   CRP 7 02/10/2019   ESRSEDRATE 21 02/10/2019       Note: Above Lab results reviewed.  Recent Imaging Review  DG Lumbar Spine Complete W/Bend CLINICAL DATA:  Low back pain for 1 month radiating down LEFT leg, injured back lifting something heavy, lumbar spondylosis, lumbar facet syndrome  EXAM: LUMBAR SPINE - COMPLETE WITH BENDING VIEWS  COMPARISON:  11/18/2015  FINDINGS: Osseous demineralization.  Five non-rib-bearing lumbar vertebra.  Multilevel endplate spur formation throughout lumbar spine.  Minimal disc space narrowing L4-L5.  Vertebral body heights maintained without fracture or bone destruction.  Minimal retrolisthesis at L5-S1 approximately 3  mm which increases to 6 mm with extension and appears unchanged with flexion.  Remaining alignments normal it out abnormal motion with flexion or extension.  SI joints preserved.  BILATERAL hip prostheses.  IMPRESSION: Scattered degenerative disc disease changes of the lumbar spine.  Minimal retrolisthesis at L5-S1 which mildly increases with extension and is unchanged with flexion.  Electronically Signed   By: Lavonia Dana M.D.   On: 11/08/2017 11:02  Note: Reviewed        Physical Exam  General appearance: Well nourished, well developed, and well hydrated. In no apparent acute distress Mental status: Alert, oriented x 3 (person, place, & time)       Respiratory: No evidence of acute respiratory distress Eyes: PERLA Vitals: BP (!) 118/54   Pulse 80   Temp (!) 97.2 F (36.2 C)   Ht _0  (1.727 m)   Wt 142 lb (64.4 kg)   SpO2 100%   BMI 21.59 kg/m  BMI: Estimated body mass index is 21.59 kg/m as calculated from the following:   Height as of this encounter: _1  (1.727 m).   Weight as of this encounter: 142 lb (64.4 kg). Ideal: Ideal body weight: 68.4 kg (150 lb 12.7 oz)  Thoracic Spine Area Exam  Skin & Axial Inspection: Significant thoracic kyphosis Alignment: Asymmetric Functional ROM: Pain restricted ROM Stability: No instability detected Muscle Tone/Strength: Functionally intact. No obvious neuro-muscular anomalies detected. Sensory (Neurological): Neurogenic pain pattern, right rib pain Muscle strength & Tone: No palpable anomalies Lumbar Spine Area Exam  Skin & Axial Inspection: Lumbar Scoliosis Alignment: Asymmetric Functional ROM: Pain restricted ROM       Stability: No instability detected Muscle Tone/Strength: Functionally intact. No obvious neuro-muscular anomalies detected. Sensory (Neurological): Neurogenic pain pattern Palpation: Complains of area being tender to palpation         Gait & Posture Assessment  Ambulation: Patient came in today in  a wheel chair Gait: Significantly limited. Dependent on assistive device to ambulate Posture: Difficulty standing up straight, due to pain  Lower Extremity Exam      Side: Right lower extremity   Side: Left lower extremity  Stability: No instability observed           Stability: No instability observed          Skin & Extremity Inspection: Skin color, temperature, and hair growth are WNL. No peripheral edema or cyanosis. No masses, redness, swelling, asymmetry, or associated skin lesions. No contractures.   Skin & Extremity Inspection: Skin color, temperature, and hair growth  are WNL. No peripheral edema or cyanosis. No masses, redness, swelling, asymmetry, or associated skin lesions. No contractures.  Functional ROM: Pain restricted ROM for hip and knee joints           Functional ROM: Pain restricted ROM for hip and knee joints          Muscle Tone/Strength: Functionally intact. No obvious neuro-muscular anomalies detected.   Muscle Tone/Strength: Functionally intact. No obvious neuro-muscular anomalies detected.  Sensory (Neurological): Dermatomal pain pattern         Sensory (Neurological): Dermatomal pain pattern        DTR: Patellar: deferred today Achilles: deferred today Plantar: deferred today   DTR: Patellar: deferred today Achilles: deferred today Plantar: deferred today  Palpation: No palpable anomalies   Palpation: No palpable anomalies     Assessment   Status Diagnosis  Persistent Persistent Persistent 1. Chronic pain syndrome   2. Long term current use of opiate analgesic   3. T12 compression fracture, sequela   4. Osteoarthritis of shoulder (Bilateral) (L>R)         Plan of Care   [name] has a current medication list which includes the following long-term medication(s): prazosin, rivaroxaban, duloxetine, narcan, [START ON 04/23/2022] oxycodone-acetaminophen, [START ON 05/23/2022] oxycodone-acetaminophen, and [START ON 06/22/2022]  oxycodone-acetaminophen.  Continue with home physical therapy exercises   Pharmacotherapy (Medications Ordered): Meds ordered this encounter  Medications   oxyCODONE-acetaminophen (PERCOCET) 10-325 MG tablet    Sig: Take 1 tablet by mouth every 6 (six) hours as needed for pain.    Dispense:  120 tablet    Refill:  0    For chronic pain syndrome   oxyCODONE-acetaminophen (PERCOCET) 10-325 MG tablet    Sig: Take 1 tablet by mouth every 6 (six) hours as needed for pain.    Dispense:  120 tablet    Refill:  0    For chronic pain syndrome   oxyCODONE-acetaminophen (PERCOCET) 10-325 MG tablet    Sig: Take 1 tablet by mouth every 6 (six) hours as needed for pain.    Dispense:  120 tablet    Refill:  0    For chronic pain syndrome   Continue with Cymbalta Counseled/cautioned on Soma intake with concomitant opioid therapy as above.  Patient does have Narcan at home and has been on Soma for many years. Patient has prescription for Narcan  Follow-up plan:   Return in about 3 months (around 07/18/2022) for Medication Management, in person.   Recent Visits Date Type Provider Dept  01/19/22 Office Visit Gillis Santa, MD [facility]  Showing recent visits within past 90 days and meeting all other requirements Today's Visits Date Type Provider Dept  04/13/22 Office Visit Gillis Santa, MD [facility]  Showing today's visits and meeting all other requirements Future Appointments No visits were found meeting these conditions. Showing future appointments within next 90 days and meeting all other requirements  I discussed the assessment and treatment plan with the patient. The patient was provided an opportunity to ask questions and all were answered. The patient agreed with the plan and demonstrated an understanding of the instructions.  Patient advised to call back or seek an in-person evaluation if the symptoms or condition worsens.  Duration of encounter: 30  minutes.  Note by: Gillis Santa, MD Date: 04/13/2022; Time: 9:39 AM

## 2022-04-13 NOTE — Progress Notes (Signed)
Nursing Pain Medication Assessment:  Safety precautions to be maintained throughout the outpatient stay will include: orient to surroundings, keep bed in low position, maintain call bell within reach at all times, provide assistance with transfer out of bed and ambulation.  Medication Inspection Compliance: Pill count conducted under aseptic conditions, in front of the patient. Neither the pills nor the bottle was removed from the patient's sight at any time. Once count was completed pills were immediately returned to the patient in their original bottle.  Medication: Oxycodone/APAP Pill/Patch Count:  43 of 120 pills remain Pill/Patch Appearance: Markings consistent with prescribed medication Bottle Appearance: Standard pharmacy container. Clearly labeled. Filled Date: 16 / 27 / 2023 Last Medication intake:  Today

## 2022-05-04 ENCOUNTER — Telehealth: Payer: Self-pay | Admitting: Student in an Organized Health Care Education/Training Program

## 2022-05-04 NOTE — Telephone Encounter (Signed)
Patient called and I explained that he needs to call us when the pharmacy does not have the supply of medications he needs. Unfortunately  we will not be able to help him with this at this time. He is understanding and knew this when he filled the 50/120. Informed of withdrawal symptoms and instructed him to call PCP or go to ED if needed. Next script to fill 05/23/2022.

## 2022-05-04 NOTE — Telephone Encounter (Signed)
PT stated that he went to get oxycodone prescription last month pharmacy only had 50 pill count. Pharmacy don't know when medication will be back in stock. PT stated that he took the 50 because he was in so much pain. Now patient is without medication until 05-23-22. I advised patient next time to call around to different pharmacy to see if they may have it in stock. Please give patient a call.Thanks

## 2022-05-17 ENCOUNTER — Telehealth: Payer: Self-pay | Admitting: Student in an Organized Health Care Education/Training Program

## 2022-05-17 NOTE — Telephone Encounter (Signed)
LM for patient to call office.  Returning his call.

## 2022-05-17 NOTE — Telephone Encounter (Signed)
Wife states pharmacy only gave him 55 pills. He wants to know what to do about this as he will be out before next refill   Please call patient at [phone]

## 2022-05-18 ENCOUNTER — Telehealth: Payer: Self-pay | Admitting: Student in an Organized Health Care Education/Training Program

## 2022-05-18 NOTE — Telephone Encounter (Signed)
PT was returning nurse call from yesterday about his medication. Please give patient a call back. TY

## 2022-05-18 NOTE — Telephone Encounter (Signed)
Called patient and talked with him about his medication status. He was asking if he could get medication filled early related to only getting 55 filled his last filled.  Informed patient that he could not get an early fill. Patient with understanding.

## 2022-07-13 ENCOUNTER — Encounter: Payer: Self-pay | Admitting: Student in an Organized Health Care Education/Training Program

## 2022-07-13 ENCOUNTER — Ambulatory Visit
Payer: Medicare Other | Attending: Student in an Organized Health Care Education/Training Program | Admitting: Student in an Organized Health Care Education/Training Program

## 2022-07-13 VITALS — BP 117/61 | HR 69 | Temp 97.1°F | Ht 68.0 in | Wt 147.0 lb

## 2022-07-13 DIAGNOSIS — M19012 Primary osteoarthritis, left shoulder: Secondary | ICD-10-CM | POA: Diagnosis not present

## 2022-07-13 DIAGNOSIS — R0781 Pleurodynia: Secondary | ICD-10-CM | POA: Insufficient documentation

## 2022-07-13 DIAGNOSIS — Z79891 Long term (current) use of opiate analgesic: Secondary | ICD-10-CM | POA: Insufficient documentation

## 2022-07-13 DIAGNOSIS — M19011 Primary osteoarthritis, right shoulder: Secondary | ICD-10-CM | POA: Diagnosis not present

## 2022-07-13 DIAGNOSIS — S2231XD Fracture of one rib, right side, subsequent encounter for fracture with routine healing: Secondary | ICD-10-CM | POA: Insufficient documentation

## 2022-07-13 DIAGNOSIS — S22080S Wedge compression fracture of T11-T12 vertebra, sequela: Secondary | ICD-10-CM | POA: Diagnosis present

## 2022-07-13 DIAGNOSIS — G894 Chronic pain syndrome: Secondary | ICD-10-CM | POA: Diagnosis present

## 2022-07-13 DIAGNOSIS — S22080D Wedge compression fracture of T11-T12 vertebra, subsequent encounter for fracture with routine healing: Secondary | ICD-10-CM | POA: Diagnosis not present

## 2022-07-13 MED ORDER — OXYCODONE-ACETAMINOPHEN 10-325 MG PO TABS: 1.0000 | ORAL_TABLET | Freq: Four times a day (QID) | ORAL | 0 refills | Status: DC | PRN

## 2022-07-13 NOTE — Progress Notes (Signed)
Patient: Aaron Short  Service Category: E/M  Provider: Gillis Santa, MD  DOB: May 01, 1946  DOS: 07/13/2022  Specialty: Interventional Pain Management  MRN: [id number]  Setting: Ambulatory outpatient  PCP: Neomia Dear, MD  Type: Established Patient    Referring Provider: Neomia Dear, MD  Location: Office  Delivery: Face-to-face     HPI  [name], a 77 y.o. year old male, is here today because of his Chronic pain syndrome [G89.4]. [name] primary complain today is left shoulder pain.   Last encounter: My last encounter with him was on 04/13/2022  Pertinent problems: [name] has Long term current use of opiate analgesic; Long term prescription opiate use; Opioid dependence, daily use ([place]); Chronic hip pain (Secondary area of Pain) (Bilateral) (R>L); Chronic shoulder pain (Primary Area of Pain) (Bilateral) (L>R); Chronic low back pain (Third area of Pain) (Bilateral) (R>L); S/P THR: total hip replacement (Bilateral); Lumbar facet syndrome (Bilateral) (R>L); Lumbar spondylosis; Chronic pain syndrome; Chronic acromioclavicular joint pain (Left); Osteoarthritis of shoulder (Bilateral) (L>R); and T12 compression fracture, sequela on their pertinent problem list. Pain Assessment: Severity of Chronic pain is reported as a 1 /10. Location: Shoulder Left/Denies. Onset: More than a month ago. Quality: Aching, Burning, Constant. Timing: Constant. Modifying factor(s): meds and heat. Vitals:  height is 5' 8"$  (1.727 m) and weight is 147 lb (66.7 kg). His temperature is 97.1 F (36.2 C) (abnormal). His blood pressure is 117/61 and his pulse is 69. His oxygen saturation is 95%.   Reason for encounter: medication management.    No change in medical history since last visit.  Patient's pain is at baseline.  Patient continues multimodal pain regimen as prescribed.  States that it provides pain relief and improvement in functional status. Patient has URI symptoms  and was exposed to [place].  States that he is not feeling too well.  Recommend that he follow-up with his primary care provider and get COVID testing done No visits to urgent care or ED since his last visit with me.  No falls either.  Pharmacotherapy Assessment  Analgesic:  Percocet 10 mg 4 times daily as needed, quantity 120/month given compression fractures resulting chronic pain.     Monitoring: [place] PMP: PDMP reviewed during this encounter.       Pharmacotherapy: No side-effects or adverse reactions reported. Compliance: No problems identified. Effectiveness: Clinically acceptable.  UDS:  Summary  Date Value Ref Range Status  01/19/2022 Note  Final    Comment:    ==================================================================== [employer] (MW) ==================================================================== Test                             Result       Flag       Units  Drug Present and Declared for Prescription Verification   Oxycodone                      2818         EXPECTED   ng/mg creat   Oxymorphone                    6603         EXPECTED   ng/mg creat   Noroxycodone                   7501         EXPECTED   ng/mg creat   Noroxymorphone  2382         EXPECTED   ng/mg creat    Sources of oxycodone are scheduled prescription medications.    Oxymorphone, noroxycodone, and noroxymorphone are expected    metabolites of oxycodone. Oxymorphone is also available as a    scheduled prescription medication.  ==================================================================== Test                      Result    Flag   Units      Ref Range   Creatinine              101              mg/dL      >=20 ==================================================================== Declared Medications:  The flagging and interpretation on this report are based on the  following declared medications.  Unexpected results may arise from  inaccuracies in the declared  medications.   **Note: The testing scope of this panel includes these medications:   Oxycodone (Percocet)   **Note: The testing scope of this panel does not include the  following reported medications:   Acetaminophen (Tylenol)  Acetaminophen (Percocet)  Carisoprodol (Soma)  Duloxetine (Cymbalta)  Magnesium  Melatonin  Metoprolol  Naloxone (Narcan)  Prazosin (Minipress)  Rivaroxaban (Xarelto)  Rosuvastatin (Crestor) ==================================================================== For clinical consultation, please call [phone]. ====================================================================       ROS  Constitutional: Denies any fever or chills Gastrointestinal: No reported hemesis, hematochezia, vomiting, or acute GI distress Musculoskeletal:  Left shoulder pain, lumbar back pain Neurological: No reported episodes of acute onset apraxia, aphasia, dysarthria, agnosia, amnesia, paralysis, loss of coordination, or loss of consciousness  Medication Review  Cetirizine HCl, DULoxetine, Magnesium, Vitamin B + C Complex, acetaminophen, carisoprodol, melatonin, metoprolol succinate, naloxone, oxyCODONE-acetaminophen, prazosin, rivaroxaban, and rosuvastatin  History Review  Allergy: [name] is allergic to diltiazem, penicillins, and tizanidine. Drug: [name]  reports no history of drug use. Alcohol:  reports no history of alcohol use. Tobacco:  reports that he has quit smoking. His smoking use included cigarettes. He smoked an average of .5 packs per day. He has never used smokeless tobacco. Social: [name]  reports that he has quit smoking. His smoking use included cigarettes. He smoked an average of .5 packs per day. He has never used smokeless tobacco. He reports that he does not drink alcohol and does not use drugs. Medical:  has a past medical history of Arthralgia of lower leg (04/02/2012), Arthralgia of upper arm (06/18/2009), Cancer  ([place]), Diabetes mellitus without complication ([place]), FH: atrial fibrillation, and Hypertension. Surgical: [name]  has a past surgical history that includes bladder cancer surgery (2015); Joint replacement; Replacement total hip w/  resurfacing implants (Bilateral); Total knee arthroplasty (Bilateral); and Shoulder surgery (Bilateral). Family: family history includes Dementia in his mother; Heart disease in his father.  Laboratory Chemistry Profile   Renal Lab Results  Component Value Date   BUN 11 02/10/2019   CREATININE 0.85 02/10/2019   BCR 13 02/10/2019   GFRAA 99 02/10/2019   GFRNONAA 85 02/10/2019     Hepatic Lab Results  Component Value Date   AST 15 02/10/2019   ALT 17 11/18/2015   ALBUMIN 4.0 02/10/2019   ALKPHOS 89 02/10/2019     Electrolytes Lab Results  Component Value Date   NA 141 02/10/2019   K 4.9 02/10/2019   CL 102 02/10/2019   CALCIUM 9.4 02/10/2019   MG 1.9 02/10/2019     Bone Lab Results  Component Value Date   25OHVITD1 32 02/10/2019   25OHVITD2 <1.0 02/10/2019   25OHVITD3 32 02/10/2019     Inflammation (CRP: Acute Phase) (ESR: Chronic Phase) Lab Results  Component Value Date   CRP 7 02/10/2019   ESRSEDRATE 21 02/10/2019       Note: Above Lab results reviewed.  Recent Imaging Review  DG Lumbar Spine Complete W/Bend CLINICAL DATA:  Low back pain for 1 month radiating down LEFT leg, injured back lifting something heavy, lumbar spondylosis, lumbar facet syndrome  EXAM: LUMBAR SPINE - COMPLETE WITH BENDING VIEWS  COMPARISON:  11/18/2015  FINDINGS: Osseous demineralization.  Five non-rib-bearing lumbar vertebra.  Multilevel endplate spur formation throughout lumbar spine.  Minimal disc space narrowing L4-L5.  Vertebral body heights maintained without fracture or bone destruction.  Minimal retrolisthesis at L5-S1 approximately 3 mm which increases to 6 mm with extension and appears unchanged with flexion.  Remaining  alignments normal it out abnormal motion with flexion or extension.  SI joints preserved.  BILATERAL hip prostheses.  IMPRESSION: Scattered degenerative disc disease changes of the lumbar spine.  Minimal retrolisthesis at L5-S1 which mildly increases with extension and is unchanged with flexion.  Electronically Signed   By: Lavonia Dana M.D.   On: 11/08/2017 11:02  Note: Reviewed        Physical Exam  General appearance: Well nourished, well developed, and well hydrated. In no apparent acute distress Mental status: Alert, oriented x 3 (person, place, & time)       Respiratory: No evidence of acute respiratory distress Eyes: PERLA Vitals: BP 117/61   Pulse 69   Temp (!) 97.1 F (36.2 C)   Ht 5' 8"$  (1.727 m)   Wt 147 lb (66.7 kg)   SpO2 95%   BMI 22.35 kg/m  BMI: Estimated body mass index is 22.35 kg/m as calculated from the following:   Height as of this encounter: 5' 8"$  (1.727 m).   Weight as of this encounter: 147 lb (66.7 kg). Ideal: Ideal body weight: 68.4 kg (150 lb 12.7 oz)  Thoracic Spine Area Exam  Skin & Axial Inspection: Significant thoracic kyphosis Alignment: Asymmetric Functional ROM: Pain restricted ROM Stability: No instability detected Muscle Tone/Strength: Functionally intact. No obvious neuro-muscular anomalies detected. Sensory (Neurological): Neurogenic pain pattern, right rib pain Muscle strength & Tone: No palpable anomalies Lumbar Spine Area Exam  Skin & Axial Inspection: Lumbar Scoliosis Alignment: Asymmetric Functional ROM: Pain restricted ROM       Stability: No instability detected Muscle Tone/Strength: Functionally intact. No obvious neuro-muscular anomalies detected. Sensory (Neurological): Neurogenic pain pattern Palpation: Complains of area being tender to palpation         Gait & Posture Assessment  Ambulation: Patient came in today in a wheel chair Gait: Significantly limited. Dependent on assistive device to ambulate Posture:  Difficulty standing up straight, due to pain  Lower Extremity Exam      Side: Right lower extremity   Side: Left lower extremity  Stability: No instability observed           Stability: No instability observed          Skin & Extremity Inspection: Skin color, temperature, and hair growth are WNL. No peripheral edema or cyanosis. No masses, redness, swelling, asymmetry, or associated skin lesions. No contractures.   Skin & Extremity Inspection: Skin color, temperature, and hair growth are WNL. No peripheral edema or cyanosis. No masses, redness, swelling, asymmetry, or associated skin lesions. No contractures.  Functional ROM: Pain  restricted ROM for hip and knee joints           Functional ROM: Pain restricted ROM for hip and knee joints          Muscle Tone/Strength: Functionally intact. No obvious neuro-muscular anomalies detected.   Muscle Tone/Strength: Functionally intact. No obvious neuro-muscular anomalies detected.  Sensory (Neurological): Dermatomal pain pattern         Sensory (Neurological): Dermatomal pain pattern        DTR: Patellar: deferred today Achilles: deferred today Plantar: deferred today   DTR: Patellar: deferred today Achilles: deferred today Plantar: deferred today  Palpation: No palpable anomalies   Palpation: No palpable anomalies     Assessment   Status Diagnosis  Persistent Persistent Persistent 1. Chronic pain syndrome   2. Long term current use of opiate analgesic   3. T12 compression fracture, sequela   4. Osteoarthritis of shoulder (Bilateral) (L>R)   5. Rib pain on right side   6. Closed fracture of one rib of right side with routine healing, subsequent encounter         Plan of Care   [name] has a current medication list which includes the following long-term medication(s): prazosin, rivaroxaban, duloxetine, narcan, [START ON 07/22/2022] oxycodone-acetaminophen, [START ON 08/21/2022] oxycodone-acetaminophen, and [START ON  09/20/2022] oxycodone-acetaminophen.  Continue with home physical therapy exercises   Pharmacotherapy (Medications Ordered): Meds ordered this encounter  Medications   oxyCODONE-acetaminophen (PERCOCET) 10-325 MG tablet    Sig: Take 1 tablet by mouth every 6 (six) hours as needed for pain.    Dispense:  120 tablet    Refill:  0    For chronic pain syndrome   oxyCODONE-acetaminophen (PERCOCET) 10-325 MG tablet    Sig: Take 1 tablet by mouth every 6 (six) hours as needed for pain.    Dispense:  120 tablet    Refill:  0    For chronic pain syndrome   oxyCODONE-acetaminophen (PERCOCET) 10-325 MG tablet    Sig: Take 1 tablet by mouth every 6 (six) hours as needed for pain.    Dispense:  120 tablet    Refill:  0    For chronic pain syndrome   Continue with Cymbalta Counseled/cautioned on Soma intake with concomitant opioid therapy as above.  Patient does have Narcan at home and has been on Soma for many years. Patient has prescription for Narcan  Follow-up plan:   Return in about 14 weeks (around 10/19/2022) for Medication Management, in person.   Recent Visits No visits were found meeting these conditions. Showing recent visits within past 90 days and meeting all other requirements Today's Visits Date Type Provider Dept  07/13/22 Office Visit Gillis Santa, MD [facility]  Showing today's visits and meeting all other requirements Future Appointments No visits were found meeting these conditions. Showing future appointments within next 90 days and meeting all other requirements  I discussed the assessment and treatment plan with the patient. The patient was provided an opportunity to ask questions and all were answered. The patient agreed with the plan and demonstrated an understanding of the instructions.  Patient advised to call back or seek an in-person evaluation if the symptoms or condition worsens.  Duration of encounter: 30 minutes.  Note by: Gillis Santa,  MD Date: 07/13/2022; Time: 10:13 AM

## 2022-07-13 NOTE — Progress Notes (Signed)
Nursing Pain Medication Assessment:  Safety precautions to be maintained throughout the outpatient stay will include: orient to surroundings, keep bed in low position, maintain call bell within reach at all times, provide assistance with transfer out of bed and ambulation.  Medication Inspection Compliance: Pill count conducted under aseptic conditions, in front of the patient. Neither the pills nor the bottle was removed from the patient's sight at any time. Once count was completed pills were immediately returned to the patient in their original bottle.  Medication: Oxycodone/APAP Pill/Patch Count:  31 of 120 pills remain Pill/Patch Appearance: Markings consistent with prescribed medication Bottle Appearance: Standard pharmacy container. Clearly labeled. Filled Date: 1 / 25 / 2024 Last Medication intake:  TodaySafety precautions to be maintained throughout the outpatient stay will include: orient to surroundings, keep bed in low position, maintain call bell within reach at all times, provide assistance with transfer out of bed and ambulation.

## 2022-08-10 ENCOUNTER — Other Ambulatory Visit: Payer: Self-pay

## 2022-08-15 ENCOUNTER — Ambulatory Visit: Payer: Medicare Other | Admitting: Gastroenterology

## 2022-09-21 DIAGNOSIS — R1310 Dysphagia, unspecified: Secondary | ICD-10-CM | POA: Insufficient documentation

## 2022-10-05 DIAGNOSIS — W57XXXA Bitten or stung by nonvenomous insect and other nonvenomous arthropods, initial encounter: Secondary | ICD-10-CM | POA: Insufficient documentation

## 2022-10-05 DIAGNOSIS — R0781 Pleurodynia: Secondary | ICD-10-CM | POA: Insufficient documentation

## 2022-10-05 DIAGNOSIS — R892 Abnormal level of other drugs, medicaments and biological substances in specimens from other organs, systems and tissues: Secondary | ICD-10-CM | POA: Insufficient documentation

## 2022-10-09 ENCOUNTER — Telehealth: Payer: Self-pay | Admitting: Student in an Organized Health Care Education/Training Program

## 2022-10-09 NOTE — Telephone Encounter (Signed)
Spoke with patient's wife, no action necessary now. She wanted to inform our office that Aaron Short is taking additional pain meds given by Dr. Cherylann Ratel. Has appt on  10-12-22, will discuss then.

## 2022-10-09 NOTE — Telephone Encounter (Signed)
PT wife called stated that patient fell on last Thursday. PT went to PCP and he also went to urgent care. PT had some xrays done. PT wife stated that patient PCP or urgent care didn't give patient any medications. PT was told to contact pain doctor.Wife stated that patient has been using his medications to help with pain that was prescribed by Late[facility]. Please give patient a call. TY

## 2022-10-12 ENCOUNTER — Ambulatory Visit
Payer: Medicare Other | Attending: Student in an Organized Health Care Education/Training Program | Admitting: Student in an Organized Health Care Education/Training Program

## 2022-10-12 ENCOUNTER — Encounter: Payer: Self-pay | Admitting: Student in an Organized Health Care Education/Training Program

## 2022-10-12 VITALS — BP 102/56 | HR 105 | Temp 97.3°F | Resp 18 | Ht 68.0 in | Wt 140.0 lb

## 2022-10-12 DIAGNOSIS — R0781 Pleurodynia: Secondary | ICD-10-CM | POA: Diagnosis not present

## 2022-10-12 DIAGNOSIS — M47816 Spondylosis without myelopathy or radiculopathy, lumbar region: Secondary | ICD-10-CM | POA: Diagnosis not present

## 2022-10-12 DIAGNOSIS — G588 Other specified mononeuropathies: Secondary | ICD-10-CM | POA: Diagnosis not present

## 2022-10-12 DIAGNOSIS — S2242XG Multiple fractures of ribs, left side, subsequent encounter for fracture with delayed healing: Secondary | ICD-10-CM | POA: Insufficient documentation

## 2022-10-12 DIAGNOSIS — M19011 Primary osteoarthritis, right shoulder: Secondary | ICD-10-CM | POA: Insufficient documentation

## 2022-10-12 DIAGNOSIS — G894 Chronic pain syndrome: Secondary | ICD-10-CM | POA: Diagnosis present

## 2022-10-12 DIAGNOSIS — M19012 Primary osteoarthritis, left shoulder: Secondary | ICD-10-CM | POA: Diagnosis present

## 2022-10-12 DIAGNOSIS — Z79891 Long term (current) use of opiate analgesic: Secondary | ICD-10-CM | POA: Diagnosis present

## 2022-10-12 DIAGNOSIS — S2242XA Multiple fractures of ribs, left side, initial encounter for closed fracture: Secondary | ICD-10-CM | POA: Insufficient documentation

## 2022-10-12 DIAGNOSIS — S22080S Wedge compression fracture of T11-T12 vertebra, sequela: Secondary | ICD-10-CM | POA: Diagnosis present

## 2022-10-12 MED ORDER — OXYCODONE-ACETAMINOPHEN 10-325 MG PO TABS
1.0000 | ORAL_TABLET | Freq: Four times a day (QID) | ORAL | 0 refills | Status: DC | PRN
Start: 2022-11-20 — End: 2022-11-15

## 2022-10-12 MED ORDER — OXYCODONE-ACETAMINOPHEN 10-325 MG PO TABS
1.0000 | ORAL_TABLET | Freq: Four times a day (QID) | ORAL | 0 refills | Status: DC | PRN
Start: 2022-10-21 — End: 2022-11-15

## 2022-10-12 MED ORDER — OXYCODONE-ACETAMINOPHEN 10-325 MG PO TABS
1.0000 | ORAL_TABLET | Freq: Four times a day (QID) | ORAL | 0 refills | Status: DC | PRN
Start: 2022-12-20 — End: 2022-11-15

## 2022-10-12 NOTE — Progress Notes (Signed)
Nursing Pain Medication Assessment:  Safety precautions to be maintained throughout the outpatient stay will include: orient to surroundings, keep bed in low position, maintain call bell within reach at all times, provide assistance with transfer out of bed and ambulation.  Medication Inspection Compliance: Pill count conducted under aseptic conditions, in front of the patient. Neither the pills nor the bottle was removed from the patient's sight at any time. Once count was completed pills were immediately returned to the patient in their original bottle.  Medication: Oxycodone/APAP Pill/Patch Count:  10 of 120 pills remain Pill/Patch Appearance: Markings consistent with prescribed medication Bottle Appearance: Standard pharmacy container. Clearly labeled. Filled Date: 04 / 25 / 2024 Last Medication intake:  TodaySafety precautions to be maintained throughout the outpatient stay will include: orient to surroundings, keep bed in low position, maintain call bell within reach at all times, provide assistance with transfer out of bed and ambulation.

## 2022-10-12 NOTE — Progress Notes (Signed)
PROVIDER NOTE: Information contained herein reflects review and annotations entered in association with encounter. Interpretation of such information and data should be left to medically-trained personnel. Information provided to patient can be located elsewhere in the medical record under "Patient Instructions". Document created using STT-dictation technology, any transcriptional errors that may result from process are unintentional.    Patient: Aaron Short  Service Category: E/M  Provider: Edward Jolly, MD  DOB: 1945-10-17  DOS: 10/12/2022  Referring Provider: Rosemarie Ax, MD  MRN: [id number]  Specialty: Interventional Pain Management  PCP: Rosemarie Ax, MD  Type: Established Patient  Setting: Ambulatory outpatient    Location: Office  Delivery: Face-to-face     HPI  [name], a 77 y.o. 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 Mr. Screws primary complain today is Back Pain (Upper, middle, lower)  Pertinent problems: Mr. Schipani has Long term current use of opiate analgesic; Long term prescription opiate use; Opioid dependence, daily use (HCC); Chronic hip pain (Secondary area of Pain) (Bilateral) (R>L); Chronic shoulder pain (Primary Area of Pain) (Bilateral) (L>R); Chronic low back pain (Third area of Pain) (Bilateral) (R>L); S/P THR: total hip replacement (Bilateral); Lumbar facet syndrome (Bilateral) (R>L); Lumbar spondylosis; Chronic pain syndrome; Chronic acromioclavicular joint pain (Left); Osteoarthritis of shoulder (Bilateral) (L>R); and T12 compression fracture, sequela on their pertinent problem list. Pain Assessment: Severity of Chronic pain is reported as a 6 /10. Location: Back Upper, Mid, Lower/"shocking" sensation down both legs. Onset: More than a month ago. Quality: Archie Patten. Timing: Constant. Modifying factor(s): medications, ice, heat. Vitals:  height is 5\' 8"  (1.727 m) and weight is 140 lb (63.5 kg).  His temporal temperature is 97.3 F (36.3 C) (abnormal). His blood pressure is 102/56 (abnormal) and his pulse is 105 (abnormal). His respiration is 18 and oxygen saturation is 95%.  BMI: Estimated body mass index is 21.29 kg/m as calculated from the following:   Height as of this encounter: 5\' 8"  (1.727 m).   Weight as of this encounter: 140 lb (63.5 kg). Last encounter: 07/13/2022. Last procedure: Visit date not found.  Reason for encounter: medication management. Patient also fell in the shower May 9th and injured himself. Having increased left rib pain due to left rib fractures- left 7th and 8th rib. Having trouble with deep inspiration due to pain. Also having pain with lateral rotation.  Pharmacotherapy Assessment  Analgesic:  Percocet 10 mg 4 times daily as needed, quantity 120/month given compression fractures resulting chronic pain.     Monitoring: [place] PMP: PDMP reviewed during this encounter.       Pharmacotherapy: No side-effects or adverse reactions reported. Compliance: No problems identified. Effectiveness: Clinically acceptable.  Concepcion Elk, RN  10/12/2022 10:34 AM  Sign when Signing Visit Nursing Pain Medication Assessment:  Safety precautions to be maintained throughout the outpatient stay will include: orient to surroundings, keep bed in low position, maintain call bell within reach at all times, provide assistance with transfer out of bed and ambulation.  Medication Inspection Compliance: Pill count conducted under aseptic conditions, in front of the patient. Neither the pills nor the bottle was removed from the patient's sight at any time. Once count was completed pills were immediately returned to the patient in their original bottle.  Medication: Oxycodone/APAP Pill/Patch Count:  10 of 120 pills remain Pill/Patch Appearance: Markings consistent with prescribed medication Bottle Appearance: Standard pharmacy container. Clearly labeled. Filled Date: 04 / 25 /  2024 Last Medication intake:  TodaySafety  precautions to be maintained throughout the outpatient stay will include: orient to surroundings, keep bed in low position, maintain call bell within reach at all times, provide assistance with transfer out of bed and ambulation.     No results found for: "CBDTHCR" No results found for: "D8THCCBX" No results found for: "D9THCCBX"  UDS:  Summary  Date Value Ref Range Status  01/19/2022 Note  Final    Comment:    ==================================================================== [employer] Select 13 (MW) ==================================================================== Test                             Result       Flag       Units  Drug Present and Declared for Prescription Verification   Oxycodone                      2818         EXPECTED   ng/mg creat   Oxymorphone                    6603         EXPECTED   ng/mg creat   Noroxycodone                   7501         EXPECTED   ng/mg creat   Noroxymorphone                 2382         EXPECTED   ng/mg creat    Sources of oxycodone are scheduled prescription medications.    Oxymorphone, noroxycodone, and noroxymorphone are expected    metabolites of oxycodone. Oxymorphone is also available as a    scheduled prescription medication.  ==================================================================== Test                      Result    Flag   Units      Ref Range   Creatinine              101              mg/dL      >=16 ==================================================================== Declared Medications:  The flagging and interpretation on this report are based on the  following declared medications.  Unexpected results may arise from  inaccuracies in the declared medications.   **Note: The testing scope of this panel includes these medications:   Oxycodone (Percocet)   **Note: The testing scope of this panel does not include the  following reported medications:    Acetaminophen (Tylenol)  Acetaminophen (Percocet)  Carisoprodol (Soma)  Duloxetine (Cymbalta)  Magnesium  Melatonin  Metoprolol  Naloxone (Narcan)  Prazosin (Minipress)  Rivaroxaban (Xarelto)  Rosuvastatin (Crestor) ==================================================================== For clinical consultation, please call [phone]. ====================================================================       ROS  Constitutional: Denies any fever or chills Gastrointestinal: No reported hemesis, hematochezia, vomiting, or acute GI distress Musculoskeletal:  left rib, low back pain Neurological: No reported episodes of acute onset apraxia, aphasia, dysarthria, agnosia, amnesia, paralysis, loss of coordination, or loss of consciousness  Medication Review  Cetirizine HCl, Magnesium, Vitamin B + C Complex, acetaminophen, carisoprodol, melatonin, naloxone, ondansetron, oxyCODONE-acetaminophen, prazosin, rivaroxaban, rosuvastatin, and sertraline  History Review  Allergy: Mr. Greenfield is allergic to diltiazem, penicillins, and tizanidine. Drug: Mr. Meseke  reports no history of drug use. Alcohol:  reports no history of alcohol use.  Tobacco:  reports that he has been smoking cigarettes. He has been smoking an average of .5 packs per day. He has never used smokeless tobacco. Social: Mr. Falkowitz  reports that he has been smoking cigarettes. He has been smoking an average of .5 packs per day. He has never used smokeless tobacco. He reports that he does not drink alcohol and does not use drugs. Medical:  has a past medical history of Arthralgia of lower leg (04/02/2012), Arthralgia of upper arm (06/18/2009), Cancer (HCC), Diabetes mellitus without complication (HCC), FH: atrial fibrillation, and Hypertension. Surgical: Mr. Hochhalter  has a past surgical history that includes bladder cancer surgery (2015); Joint replacement; Replacement total hip w/  resurfacing implants  (Bilateral); Total knee arthroplasty (Bilateral); and Shoulder surgery (Bilateral). Family: family history includes Dementia in his mother; Heart disease in his father.  Laboratory Chemistry Profile   Renal Lab Results  Component Value Date   BUN 11 02/10/2019   CREATININE 0.85 02/10/2019   BCR 13 02/10/2019   GFRAA 99 02/10/2019   GFRNONAA 85 02/10/2019    Hepatic Lab Results  Component Value Date   AST 15 02/10/2019   ALT 17 11/18/2015   ALBUMIN 4.0 02/10/2019   ALKPHOS 89 02/10/2019    Electrolytes Lab Results  Component Value Date   NA 141 02/10/2019   K 4.9 02/10/2019   CL 102 02/10/2019   CALCIUM 9.4 02/10/2019   MG 1.9 02/10/2019    Bone Lab Results  Component Value Date   25OHVITD1 32 02/10/2019   25OHVITD2 <1.0 02/10/2019   25OHVITD3 32 02/10/2019    Inflammation (CRP: Acute Phase) (ESR: Chronic Phase) Lab Results  Component Value Date   CRP 7 02/10/2019   ESRSEDRATE 21 02/10/2019         Note: Above Lab results reviewed.  Recent Imaging Review  DG Lumbar Spine Complete W/Bend CLINICAL DATA:  Low back pain for 1 month radiating down LEFT leg, injured back lifting something heavy, lumbar spondylosis, lumbar facet syndrome  EXAM: LUMBAR SPINE - COMPLETE WITH BENDING VIEWS  COMPARISON:  11/18/2015  FINDINGS: Osseous demineralization.  Five non-rib-bearing lumbar vertebra.  Multilevel endplate spur formation throughout lumbar spine.  Minimal disc space narrowing L4-L5.  Vertebral body heights maintained without fracture or bone destruction.  Minimal retrolisthesis at L5-S1 approximately 3 mm which increases to 6 mm with extension and appears unchanged with flexion.  Remaining alignments normal it out abnormal motion with flexion or extension.  SI joints preserved.  BILATERAL hip prostheses.  IMPRESSION: Scattered degenerative disc disease changes of the lumbar spine.  Minimal retrolisthesis at L5-S1 which mildly increases  with extension and is unchanged with flexion.  Electronically Signed   By: Ulyses Southward M.D.   On: 11/08/2017 11:02 Note: Reviewed        Physical Exam  General appearance: Well nourished, well developed, and well hydrated. In no apparent acute distress Mental status: Alert, oriented x 3 (person, place, & time)       Respiratory: No evidence of acute respiratory distress Eyes: PERLA Vitals: BP (!) 102/56   Pulse (!) 105   Temp (!) 97.3 F (36.3 C) (Temporal)   Resp 18   Ht 5\' 8"  (1.727 m)   Wt 140 lb (63.5 kg)   SpO2 95%   BMI 21.29 kg/m  BMI: Estimated body mass index is 21.29 kg/m as calculated from the following:   Height as of this encounter: 5\' 8"  (1.727 m).   Weight as of this encounter:  140 lb (63.5 kg). Ideal: Ideal body weight: 68.4 kg (150 lb 12.7 oz)  Left rib pain, tender to palpation  Low back pain  Assessment   Diagnosis Status  1. Intercostal neuralgia   2. Closed fracture of multiple ribs of left side with delayed healing, subsequent encounter   3. Rib pain on left side   4. Long term current use of opiate analgesic   5. Lumbar spondylosis   6. Chronic pain syndrome   7. T12 compression fracture, sequela   8. Osteoarthritis of shoulder (Bilateral) (L>R)    Having a Flare-up Stable Having a Flare-up   Updated Problems: Problem  Intercostal Neuralgia  Multiple Closed Fractures of Ribs of Left Side    Plan of Care   Mr. Radley Twing has a current medication list which includes the following long-term medication(s): prazosin, rivaroxaban, sertraline, narcan, [START ON 10/21/2022] oxycodone-acetaminophen, [START ON 11/20/2022] oxycodone-acetaminophen, and [START ON 12/20/2022] oxycodone-acetaminophen.  Pharmacotherapy (Medications Ordered): Meds ordered this encounter  Medications   oxyCODONE-acetaminophen (PERCOCET) 10-325 MG tablet    Sig: Take 1 tablet by mouth every 6 (six) hours as needed for pain.    Dispense:  120 tablet    Refill:   0    For chronic pain syndrome   oxyCODONE-acetaminophen (PERCOCET) 10-325 MG tablet    Sig: Take 1 tablet by mouth every 6 (six) hours as needed for pain.    Dispense:  120 tablet    Refill:  0    For chronic pain syndrome   oxyCODONE-acetaminophen (PERCOCET) 10-325 MG tablet    Sig: Take 1 tablet by mouth every 6 (six) hours as needed for pain.    Dispense:  120 tablet    Refill:  0    For chronic pain syndrome   Orders: PRN left INTERCOSTAL NERVE BLOCK, patient will call to schedule Orders Placed This Encounter  Procedures   INTERCOSTAL NERVE BLOCK    Standing Status:   Standing    Number of Occurrences:   2    Standing Expiration Date:   04/14/2023    Scheduling Instructions:     Side: LEFT     Sedation: without     Timeframe: PRN    Order Specific Question:   Where will this procedure be performed?    Answer:   [facility]   Follow-up plan:   Return in about 14 weeks (around 01/18/2023) for Medication Management, in person.      Recent Visits No visits were found meeting these conditions. Showing recent visits within past 90 days and meeting all other requirements Today's Visits Date Type Provider Dept  10/12/22 Office Visit Edward Jolly, MD [facility]  Showing today's visits and meeting all other requirements Future Appointments No visits were found meeting these conditions. Showing future appointments within next 90 days and meeting all other requirements  I discussed the assessment and treatment plan with the patient. The patient was provided an opportunity to ask questions and all were answered. The patient agreed with the plan and demonstrated an understanding of the instructions.  Patient advised to call back or seek an in-person evaluation if the symptoms or condition worsens.  Duration of encounter: .  Total time on encounter, as per AMA guidelines included both the face-to-face and non-face-to-face time personally spent by the  physician and/or other qualified health care professional(s) on the day of the encounter (includes time in activities that require the physician or other qualified health care professional and does not  include time in activities normally performed by clinical staff). Physician's time may include the following activities when performed: Preparing to see the patient (e.g., pre-charting review of records, searching for previously ordered imaging, lab work, and nerve conduction tests) Review of prior analgesic pharmacotherapies. Reviewing PMP Interpreting ordered tests (e.g., lab work, imaging, nerve conduction tests) Performing post-procedure evaluations, including interpretation of diagnostic procedures Obtaining and/or reviewing separately obtained history Performing a medically appropriate examination and/or evaluation Counseling and educating the patient/family/caregiver Ordering medications, tests, or procedures Referring and communicating with other health care professionals (when not separately reported) Documenting clinical information in the electronic or other health record Independently interpreting results (not separately reported) and communicating results to the patient/ family/caregiver Care coordination (not separately reported)  Note by: Edward Jolly, MD Date: 10/12/2022; Time: 11:07 AM

## 2022-10-18 NOTE — Progress Notes (Signed)
Aaron Amy, PA-C [address]  [address]  [place], [place] [address]  Main: [phone]  Fax: [phone]   Gastroenterology Consultation  Referring Provider:     Rosemarie Ax, MD Primary Care Physician:  Aaron Ax, MD Primary Gastroenterologist:  Aaron Short / Aaron Amy, PA-C  Reason for Consultation:     Nausea, heartburn, weight loss        HPI:   Aaron Short is a 77 y.o. y/o male referred for consultation & management  by Aaron Ax, MD.    Here to evaluate nausea, heartburn, and weight loss.  He is here today with his wife, Aaron Dandy, who is a retired Engineer, civil (consulting).  Patient has lost 40 pounds unintentionally in the past 2 or 3 years.  He has had increasing nausea for the past 3 months.  No vomiting.  Has a brother who had esophageal cancer.  Patient has not had any abdominal pain.  He is scheduled for CT of his chest, abdomen, and pelvis tomorrow, ordered by his PCP at [facility].  No previous EGD.  He reports difficulty swallowing liquids such as water.  He feels like it goes down his airway and he has a lot of coughing and gagging.  This is occurring daily for 1 year.  He is not having any trouble swallowing solid food.  He has irregular bowel habits with diarrhea alternating with constipation.  He denies rectal bleeding.  No family history of colon cancer.  Colonoscopy done 06/2015 at [facility] showed 4 adenomatous polyps removed (size 2 mm to 8 mm), internal hemorrhoids, diverticulosis, excellent prep.  Labs 09/21/2022: CBC showed mild anemia with hemoglobin 12.3, hematocrit 35, MCV 93.  Normal BUN/creatinine.  Low albumin 3.1.  Slightly elevated AST 38, alkaline phosphatase 129.    Labs 10/05/2022 were positive for [facility] spotted fever and Ehrlichia.  Negative for Lyme's antibody.  He was started on doxycycline 100 mg twice daily for 14 days.  History of chronic pain and long-term use of opioid medication (Oxycodone).  Has history of atrial  fibrillation and is currently on Xarelto prescribed by his cardiologist at [facility].   Past Medical History:  Diagnosis Date   Arthralgia of lower leg 04/02/2012   Arthralgia of upper arm 06/18/2009   Cancer (HCC)    Diabetes mellitus without complication (HCC)    FH: atrial fibrillation    Hypertension     Past Surgical History:  Procedure Laterality Date   bladder cancer surgery  2015   JOINT REPLACEMENT     REPLACEMENT TOTAL HIP W/  RESURFACING IMPLANTS Bilateral    SHOULDER SURGERY Bilateral    TOTAL KNEE ARTHROPLASTY Bilateral     Prior to Admission medications   Medication Sig Start Date End Date Taking? Authorizing Provider  acetaminophen (TYLENOL) 500 MG tablet Take 500 mg by mouth every 8 (eight) hours as needed.    [provider]  B Complex-C (VITAMIN B + C COMPLEX) TABS Take 1 capsule by mouth every morning.    [provider]  carisoprodol (SOMA) 350 MG tablet Take 350 mg by mouth 2 (two) times daily.     [provider]  Cetirizine HCl 10 MG CAPS Take 1 capsule by mouth every morning.    [provider]  Magnesium 500 MG TABS Take 500 mg by mouth as needed.    [provider]  melatonin (MELATONIN MAXIMUM STRENGTH) 5 MG TABS Take 5 mg by mouth at bedtime as needed.  [provider]  naloxone [facility]) 4 MG/0.1ML LIQD nasal spray kit Place 1 spray into the nose once for 1 dose. Spray half of bottle content into each nostril, then call 911 12/31/18 01/19/22  Delano Metz, MD  ondansetron (ZOFRAN) 8 MG tablet Take by mouth. 10/10/22   [provider]  oxyCODONE-acetaminophen (PERCOCET) 10-325 MG tablet Take 1 tablet by mouth every 6 (six) hours as needed for pain. 10/21/22 11/20/22  Edward Jolly, MD  oxyCODONE-acetaminophen (PERCOCET) 10-325 MG tablet Take 1 tablet by mouth every 6 (six) hours as needed for pain. 11/20/22 12/20/22  Edward Jolly, MD  oxyCODONE-acetaminophen (PERCOCET) 10-325 MG tablet Take 1 tablet  by mouth every 6 (six) hours as needed for pain. 12/20/22 01/19/23  Edward Jolly, MD  prazosin (MINIPRESS) 1 MG capsule Take 1 mg by mouth at bedtime. 10/01/20   [provider]  rivaroxaban (XARELTO) 20 MG TABS tablet Take 20 mg by mouth. 03/26/17   [provider]  rosuvastatin (CRESTOR) 20 MG tablet Take 20 mg by mouth daily.    [provider]  sertraline (ZOLOFT) 50 MG tablet Take by mouth. 10/05/22 01/07/23  [provider]    Family History  Problem Relation Age of Onset   Dementia Mother    Heart disease Father      Social History   Tobacco Use   Smoking status: Every Day    Packs/day: .5    Types: Cigarettes   Smokeless tobacco: Never  Vaping Use   Vaping Use: Never used  Substance Use Topics   Alcohol use: No    Alcohol/week: 0.0 standard drinks of alcohol   Drug use: No    Allergies as of 10/19/2022 - Review Complete 10/12/2022  Allergen Reaction Noted   Diltiazem Swelling 04/14/2011   Penicillins Hives and Rash 09/13/2012   Tizanidine Other (See Comments) 09/13/2012    Review of Systems:    All systems reviewed and negative except where noted in HPI.   Physical Exam:  There were no vitals taken for this visit. No LMP for male patient. Psych:  Alert and cooperative. Normal Short and affect. General:   Alert,  Well-developed, very thin, cachectic, pleasant and cooperative; mildly ill-appearing, chronic back pain.  Hard for him to lay down on exam table.  He walks with no assistive devices. Head:  Normocephalic and atraumatic. Eyes:  Sclera clear, no icterus.   Conjunctiva pink. Neck:  Supple; no masses or thyromegaly. Lungs:  Respirations even and unlabored.  Clear throughout to auscultation.   No wheezes, crackles, or rhonchi. No acute distress. Heart:  Regular rate and rhythm; no murmurs, clicks, rubs, or gallops. Abdomen:  Normal bowel sounds.  No bruits.  Soft, and non-distended without masses, hepatosplenomegaly or hernias  noted.  No Tenderness.  No guarding or rebound tenderness.    Neurologic:  Alert and oriented x3;  grossly normal neurologically. Psych:  Alert and cooperative.  Depressed Short and affect.  Imaging Studies: No results found.  Assessment and Plan:   Singleton Polek is a 77 y.o. y/o male has been referred for unintentional weight loss, nausea, and dysphagia.  He is scheduled for CT of his chest, abdomen, and pelvis tomorrow, ordered by his PCP at [facility].  Has history of 4 adenomatous colon polyps removed 06/2015 with his last colonoscopy at [facility].  No previous EGD.  He is having dysphagia to liquids and I am concerned about aspiration.  I am ordering EGD, repeat colonoscopy, and a modified barium swallow  for further evaluation.  He will continue with plan for CT as scheduled tomorrow.  Evaluate for esophageal cancer.  His brother had esophageal cancer.  Weight Loss  Scheduling EGD and Colonoscopy I discussed risks of EGD and colonoscopy with patient to include risk of bleeding, colon perforation, and risk of sedation.   Patient expressed understanding and agrees to proceed with colonoscopy.   Dysphagia - To Liquids Only with Cough and Aspiration  Scheduling modified barium swallow with SLP  GERD  OTC Pepcid 20g BID, OTC Prilosec 20mg  QD.  History of colon polyps  Repeat colonoscopy.  Family history of esophageal cancer -brother Scheduling EGD.  Chronic atrial fibrillation; followed by Dr. Margaretha Glassing at [facility] cardiology  Requesting permission to hold Xarelto prior to EGD and colonoscopy procedures.  Follow up in 4 weeks after EGD and colonoscopy procedures with TG or MD.  Aaron Amy, PA-C

## 2022-10-19 ENCOUNTER — Other Ambulatory Visit: Payer: Self-pay

## 2022-10-19 ENCOUNTER — Ambulatory Visit: Payer: Medicare Other | Admitting: Physician Assistant

## 2022-10-19 ENCOUNTER — Encounter: Payer: Self-pay | Admitting: Physician Assistant

## 2022-10-19 VITALS — BP 114/55 | HR 85 | Temp 98.7°F | Ht 68.0 in | Wt 139.6 lb

## 2022-10-19 DIAGNOSIS — R1310 Dysphagia, unspecified: Secondary | ICD-10-CM

## 2022-10-19 DIAGNOSIS — R634 Abnormal weight loss: Secondary | ICD-10-CM | POA: Diagnosis not present

## 2022-10-19 DIAGNOSIS — R059 Cough, unspecified: Secondary | ICD-10-CM

## 2022-10-19 DIAGNOSIS — Z86010 Personal history of colonic polyps: Secondary | ICD-10-CM

## 2022-10-19 DIAGNOSIS — K219 Gastro-esophageal reflux disease without esophagitis: Secondary | ICD-10-CM | POA: Diagnosis not present

## 2022-10-19 DIAGNOSIS — I482 Chronic atrial fibrillation, unspecified: Secondary | ICD-10-CM

## 2022-10-19 MED ORDER — PEG 3350-KCL-NA BICARB-NACL 420 G PO SOLR: 4000.0000 mL | Freq: Once | ORAL | 0 refills | Status: AC

## 2022-10-19 NOTE — Patient Instructions (Addendum)
Barium swallow scheduled [facility] entrance 11/09/22 @ 12:30. Regular breakfast light lunch.   I will send Xarelto clearance form to your cardiologist Dr.Conway at [facility]. Please call his office to see if you need an appointment.

## 2022-10-26 ENCOUNTER — Telehealth: Payer: Self-pay | Admitting: Physician Assistant

## 2022-10-26 NOTE — Telephone Encounter (Signed)
Dr. Roseanne Reno from [facility] left a message would like a call back with results on pt  please return call to her cell- [phone]

## 2022-10-27 ENCOUNTER — Encounter: Payer: Self-pay | Admitting: Gastroenterology

## 2022-10-27 NOTE — Telephone Encounter (Signed)
Pt wife left message in ref to lab results from [facility]  [phone] she would like for you to call the physician

## 2022-10-27 NOTE — Telephone Encounter (Signed)
Spoke with patient's wife and she wishes to move forward with EGD on 10/30/22 with Dr.Vanga -I let her know we are cancelling colonoscopy and do not do the prep for the Colonoscopy . Patient's wife verbalized understanding.  Spoke with Crystal at [facility] cardiology- office regarding Xarelto clearance-   Spoke with Pam and crystal and both have stated patient has not been seen in one year and the patient needs office visit prior to Xarelto clearance being granted.  Patients wife will call Dr.Conway's office and have him paged to see if we can get the clearance. [phone].

## 2022-10-30 ENCOUNTER — Encounter: Payer: Self-pay | Admitting: Gastroenterology

## 2022-10-30 ENCOUNTER — Telehealth: Payer: Self-pay

## 2022-10-30 ENCOUNTER — Ambulatory Visit
Admission: RE | Admit: 2022-10-30 | Discharge: 2022-10-30 | Disposition: A | Discharge status: Home / Self Care | Payer: Medicare Other | Attending: Gastroenterology | Admitting: Gastroenterology

## 2022-10-30 ENCOUNTER — Other Ambulatory Visit: Payer: Self-pay

## 2022-10-30 ENCOUNTER — Ambulatory Visit: Payer: Medicare Other | Admitting: Anesthesiology

## 2022-10-30 ENCOUNTER — Encounter
Admission: RE | Disposition: A | Discharge status: Home / Self Care | Payer: Self-pay | Source: Home / Self Care | Attending: Gastroenterology

## 2022-10-30 DIAGNOSIS — E119 Type 2 diabetes mellitus without complications: Secondary | ICD-10-CM | POA: Diagnosis not present

## 2022-10-30 DIAGNOSIS — C155 Malignant neoplasm of lower third of esophagus: Secondary | ICD-10-CM | POA: Insufficient documentation

## 2022-10-30 DIAGNOSIS — R933 Abnormal findings on diagnostic imaging of other parts of digestive tract: Secondary | ICD-10-CM | POA: Insufficient documentation

## 2022-10-30 DIAGNOSIS — K2289 Other specified disease of esophagus: Secondary | ICD-10-CM

## 2022-10-30 DIAGNOSIS — F32A Depression, unspecified: Secondary | ICD-10-CM | POA: Insufficient documentation

## 2022-10-30 DIAGNOSIS — I4891 Unspecified atrial fibrillation: Secondary | ICD-10-CM | POA: Diagnosis not present

## 2022-10-30 DIAGNOSIS — F418 Other specified anxiety disorders: Secondary | ICD-10-CM | POA: Diagnosis not present

## 2022-10-30 DIAGNOSIS — R1314 Dysphagia, pharyngoesophageal phase: Secondary | ICD-10-CM | POA: Diagnosis present

## 2022-10-30 DIAGNOSIS — Z6822 Body mass index (BMI) 22.0-22.9, adult: Secondary | ICD-10-CM | POA: Diagnosis not present

## 2022-10-30 DIAGNOSIS — R059 Cough, unspecified: Secondary | ICD-10-CM

## 2022-10-30 DIAGNOSIS — R1310 Dysphagia, unspecified: Secondary | ICD-10-CM | POA: Diagnosis not present

## 2022-10-30 DIAGNOSIS — K219 Gastro-esophageal reflux disease without esophagitis: Secondary | ICD-10-CM | POA: Insufficient documentation

## 2022-10-30 DIAGNOSIS — F17210 Nicotine dependence, cigarettes, uncomplicated: Secondary | ICD-10-CM | POA: Insufficient documentation

## 2022-10-30 DIAGNOSIS — R634 Abnormal weight loss: Secondary | ICD-10-CM | POA: Diagnosis not present

## 2022-10-30 HISTORY — PX: ESOPHAGOGASTRODUODENOSCOPY (EGD) WITH PROPOFOL: SHX5813

## 2022-10-30 SURGERY — ESOPHAGOGASTRODUODENOSCOPY (EGD) WITH PROPOFOL
Anesthesia: General

## 2022-10-30 MED ORDER — SODIUM CHLORIDE 0.9 % IV SOLN: INTRAVENOUS | Status: DC

## 2022-10-30 MED ORDER — LIDOCAINE HCL (CARDIAC) PF 100 MG/5ML IV SOSY
PREFILLED_SYRINGE | INTRAVENOUS | Status: DC | PRN
  Administered 2022-10-30: 100 mg via INTRAVENOUS

## 2022-10-30 MED ORDER — PROPOFOL 10 MG/ML IV BOLUS
INTRAVENOUS | Status: DC | PRN
  Administered 2022-10-30: 60 mg via INTRAVENOUS

## 2022-10-30 MED ORDER — PROPOFOL 500 MG/50ML IV EMUL
INTRAVENOUS | Status: DC | PRN
  Administered 2022-10-30: 196.629 ug/kg/min via INTRAVENOUS

## 2022-10-30 NOTE — Telephone Encounter (Signed)
Cardiac clearance received from Dr.James Fransico Michael -patient cleared to have procedure- may stop Xarelto 3 days prior-and restart when GI team feels it is safe to restart.

## 2022-10-30 NOTE — H&P (Signed)
Aaron [name], MD [address]  [address]  [place], [place] [address]  Main: [phone]  Fax: [phone] Pager: [phone]  Primary Care Physician:  Rosemarie Ax, MD Primary Gastroenterologist:  Dr. Arlyss Short  Pre-Procedure History & Physical: HPI:  Konstantine Vogelpohl is a 77 y.o. male is here for an endoscopy.   Past Medical History:  Diagnosis Date   Arthralgia of lower leg 04/02/2012   Arthralgia of upper arm 06/18/2009   Cancer (HCC)    Diabetes mellitus without complication (HCC)    FH: atrial fibrillation    Hypertension     Past Surgical History:  Procedure Laterality Date   bladder cancer surgery  2015   JOINT REPLACEMENT     REPLACEMENT TOTAL HIP W/  RESURFACING IMPLANTS Bilateral    SHOULDER SURGERY Bilateral    TOTAL KNEE ARTHROPLASTY Bilateral     Prior to Admission medications   Medication Sig Start Date End Date Taking? Authorizing Provider  carisoprodol (SOMA) 350 MG tablet Take 350 mg by mouth 2 (two) times daily.    Yes [provider]  oxyCODONE-acetaminophen (PERCOCET) 10-325 MG tablet Take 1 tablet by mouth every 6 (six) hours as needed for pain. 10/21/22 11/20/22 Yes Edward Jolly, MD  rosuvastatin (CRESTOR) 20 MG tablet Take 20 mg by mouth daily.   Yes [provider]  sertraline (ZOLOFT) 50 MG tablet Take by mouth. 10/05/22 01/07/23 Yes [provider]  acetaminophen (TYLENOL) 500 MG tablet Take 500 mg by mouth every 8 (eight) hours as needed.    [provider]  B Complex-C (VITAMIN B + C COMPLEX) TABS Take 1 capsule by mouth every morning.    [provider]  Cetirizine HCl 10 MG CAPS Take 1 capsule by mouth every morning.    [provider]  Magnesium 500 MG TABS Take 500 mg by mouth as needed.    [provider]  melatonin (MELATONIN MAXIMUM STRENGTH) 5 MG TABS Take 5 mg by mouth at bedtime as needed.    [provider]  naloxone [facility]) 4 MG/0.1ML LIQD  nasal spray kit Place 1 spray into the nose once for 1 dose. Spray half of bottle content into each nostril, then call 911 12/31/18 01/19/22  Delano Metz, MD  ondansetron (ZOFRAN) 8 MG tablet Take by mouth. 10/10/22   [provider]  oxyCODONE-acetaminophen (PERCOCET) 10-325 MG tablet Take 1 tablet by mouth every 6 (six) hours as needed for pain. 11/20/22 12/20/22  Edward Jolly, MD  oxyCODONE-acetaminophen (PERCOCET) 10-325 MG tablet Take 1 tablet by mouth every 6 (six) hours as needed for pain. 12/20/22 01/19/23  Edward Jolly, MD  prazosin (MINIPRESS) 1 MG capsule Take 1 mg by mouth at bedtime. 10/01/20   [provider]  rivaroxaban (XARELTO) 20 MG TABS tablet Take 20 mg by mouth. 03/26/17   [provider]    Allergies as of 10/19/2022 - Review Complete 10/19/2022  Allergen Reaction Noted   Diltiazem Swelling 04/14/2011   Penicillins Hives and Rash 09/13/2012   Tizanidine Other (See Comments) 09/13/2012    Family History  Problem Relation Age of Onset   Dementia Mother    Heart disease Father     Social History   Socioeconomic History   Marital status: Married    Spouse name: Not on file   Number of children: Not on file   Years of education: Not on file   Highest education level: Not on file  Occupational History   Not on  file  Tobacco Use   Smoking status: Every Day    Packs/day: .5    Types: Cigarettes   Smokeless tobacco: Never  Vaping Use   Vaping Use: Never used  Substance and Sexual Activity   Alcohol use: No    Alcohol/week: 0.0 standard drinks of alcohol   Drug use: No   Sexual activity: Not on file  Other Topics Concern   Not on file  Social History Narrative   Not on file   Social Determinants of Health   Financial Resource Strain: Not on file  Food Insecurity: Not on file  Transportation Needs: Not on file  Physical Activity: Not on file  Stress: Not on file  Social Connections: Not on file  Intimate Partner Violence:  Not on file    Review of Systems: See HPI, otherwise negative ROS  Physical Exam: BP 110/61   Pulse 62   Temp 97.8 F (36.6 C) (Temporal)   Resp 20   Ht 5\' 6"  (1.676 m)   Wt 62.3 kg   SpO2 99%   BMI 22.18 kg/m  General:   Alert,  pleasant and cooperative in NAD Head:  Normocephalic and atraumatic. Neck:  Supple; no masses or thyromegaly. Lungs:  Clear throughout to auscultation.    Heart:  Regular rate and rhythm. Abdomen:  Soft, nontender and nondistended. Normal bowel sounds, without guarding, and without rebound.   Neurologic:  Alert and  oriented x4;  grossly normal neurologically.  Impression/Plan: Jerrion Arana is here for an endoscopy to be performed for dysphagia, weight loss  Risks, benefits, limitations, and alternatives regarding  endoscopy have been reviewed with the patient.  Questions have been answered.  All parties agreeable.   Lannette Donath, MD  10/30/2022, 11:30 AM

## 2022-10-30 NOTE — Transfer of Care (Signed)
Immediate Anesthesia Transfer of Care Note  Patient: Aaron Short  Procedure(s) Performed: ESOPHAGOGASTRODUODENOSCOPY (EGD) WITH PROPOFOL  Patient Location: Endoscopy Unit  Anesthesia Type:General  Level of Consciousness: drowsy  Airway & Oxygen Therapy: Patient Spontanous Breathing  Post-op Assessment: Report given to RN and Post -op Vital signs reviewed and stable  Post vital signs: Reviewed and stable  Last Vitals: see EPIC Flowsheets Vitals Value Taken Time  BP    Temp    Pulse    Resp    SpO2      Last Pain:  Vitals:   10/30/22 1058  TempSrc: Temporal  PainSc: 2          Complications: No notable events documented.

## 2022-10-30 NOTE — Anesthesia Preprocedure Evaluation (Addendum)
Anesthesia Evaluation  Patient identified by MRN, date of birth, ID band Patient awake    Reviewed: Allergy & Precautions, H&P , NPO status , Patient's Chart, lab work & pertinent test results  Airway Mallampati: II  TM Distance: >3 FB Neck ROM: full    Dental  (+) Poor Dentition, Missing,    Pulmonary shortness of breath and with exertion, Current Smoker and Patient abstained from smoking.   Pulmonary exam normal        Cardiovascular Exercise Tolerance: Poor + dysrhythmias Atrial Fibrillation  Rhythm:Regular Rate:Normal - Peripheral Edema Remote history of hypertension  ECHO 2021 INTERPRETATION  MILD LV SYSTOLIC DYSFUNCTION (See above)  NORMAL RIGHT VENTRICULAR SYSTOLIC FUNCTION  MILD VALVULAR REGURGITATION (See above)  NO VALVULAR STENOSIS  NO PRIOR EXAM FOR COMPARISON  ATRIAL FLUTTER NOTED THROUGHOUT EXAM     Neuro/Psych  PSYCHIATRIC DISORDERS Anxiety Depression    Opiate usenegative neurological ROS     GI/Hepatic Neg liver ROS,,,Dysphagia - To Liquids Only with Cough and Aspiration Airfluid level on ct scan in distal esophagus   Endo/Other  diabetes    Renal/GU negative Renal ROS  negative genitourinary   Musculoskeletal  (+) Arthritis ,    Abdominal Normal abdominal exam  (+)   Peds  Hematology negative hematology ROS (+)   Anesthesia Other Findings Pt with history bladder cancer, weight loss, former smoker, early satiety/nausea. Recent CT chest for malignancy screening performed-  - New 4 mm nodule in the posterior segment of left lower lobe, 3 mm nodule in the left upper chest of pulmonary metastasis.  Stable mild centrilobular emphysema and mild peripheral reticulation which may reflect smoking-related lung fibrosis.  Mildly dilated esophagus with air-fluid level and circumferential thickening in the distal esophagus which may reflect chronic esophagitis. Consider endoscopic evaluation.    Past  Medical History: 04/02/2012: Arthralgia of lower leg 06/18/2009: Arthralgia of upper arm No date: Cancer (HCC) No date: Diabetes mellitus without complication (HCC) No date: FH: atrial fibrillation No date: Hypertension  Past Surgical History: 2015: bladder cancer surgery No date: JOINT REPLACEMENT No date: REPLACEMENT TOTAL HIP W/  RESURFACING IMPLANTS; Bilateral No date: SHOULDER SURGERY; Bilateral No date: TOTAL KNEE ARTHROPLASTY; Bilateral     Reproductive/Obstetrics negative OB ROS                              Anesthesia Physical Anesthesia Plan  ASA: 3  Anesthesia Plan: General   Post-op Pain Management: Minimal or no pain anticipated   Induction: Intravenous  PONV Risk Score and Plan: Propofol infusion and TIVA  Airway Management Planned: Natural Airway  Additional Equipment:   Intra-op Plan:   Post-operative Plan:   Informed Consent: I have reviewed the patients History and Physical, chart, labs and discussed the procedure including the risks, benefits and alternatives for the proposed anesthesia with the patient or authorized representative who has indicated his/her understanding and acceptance.     Dental Advisory Given  Plan Discussed with: CRNA and Surgeon  Anesthesia Plan Comments:         Anesthesia Quick Evaluation

## 2022-10-30 NOTE — Op Note (Signed)
[facility] Gastroenterology Patient Name: Aaron Short Procedure Date: 10/30/2022 11:29 AM MRN: [id number] Account #: [id number] Date of Birth: [birth year] Admit Type: Outpatient Age: 77 Room: [facility] ENDO ROOM 4 Gender: Male Note Status: Finalized Instrument Name: Upper Endoscope [id number] Procedure:             Upper GI endoscopy Indications:           Esophageal dysphagia, Abnormal CT of the GI tract,                         Weight loss Providers:             Toney Reil MD, MD Referring MD:          Toney Reil MD, MD (Referring MD) Medicines:             General Anesthesia Complications:         No immediate complications. Estimated blood loss: None. Procedure:             Pre-Anesthesia Assessment:                        - Prior to the procedure, a History and Physical was                         performed, and patient medications and allergies were                         reviewed. The patient is competent. The risks and                         benefits of the procedure and the sedation options and                         risks were discussed with the patient. All questions                         were answered and informed consent was obtained.                         Patient identification and proposed procedure were                         verified by the physician, the nurse, the                         anesthesiologist, the anesthetist and the technician                         in the pre-procedure area in the procedure room in the                         endoscopy suite. Mental Status Examination: alert and                         oriented. Airway Examination: normal oropharyngeal                         airway and neck mobility. Respiratory Examination:  clear to auscultation. CV Examination: normal.                         Prophylactic Antibiotics: The patient does not require                          prophylactic antibiotics. Prior Anticoagulants: The                         patient has taken Xarelto (rivaroxaban), last dose was                         3 days prior to procedure. ASA Grade Assessment: III -                         A patient with severe systemic disease. After                         reviewing the risks and benefits, the patient was                         deemed in satisfactory condition to undergo the                         procedure. The anesthesia plan was to use general                         anesthesia. Immediately prior to administration of                         medications, the patient was re-assessed for adequacy                         to receive sedatives. The heart rate, respiratory                         rate, oxygen saturations, blood pressure, adequacy of                         pulmonary ventilation, and response to care were                         monitored throughout the procedure. The physical                         status of the patient was re-assessed after the                         procedure.                        After obtaining informed consent, the endoscope was                         passed under direct vision. Throughout the procedure,                         the patient's blood pressure, pulse, and oxygen  saturations were monitored continuously. The Endoscope                         was introduced through the mouth, and advanced to the                         second part of duodenum. The upper GI endoscopy was                         accomplished without difficulty. The patient tolerated                         the procedure well. Findings:      The duodenal bulb and second portion of the duodenum were normal.      The entire examined stomach was normal.      The cardia and gastric fundus were normal on retroflexion.      A large, submucosal mass with no bleeding and stigmata of recent       bleeding was  found in the lower third of the esophagus, 35 to 40 cm from       the incisors. The mass was partially obstructing and circumferential.       Biopsies were taken with a cold forceps for histology. Impression:            - Normal duodenal bulb and second portion of the                         duodenum.                        - Normal stomach.                        - Partially obstructing, malignant esophageal tumor                         was found in the lower third of the esophagus.                         Biopsied. Recommendation:        - Await pathology results.                        - Discharge patient to home (with spouse).                        - Full liquid diet, chopped diet and mechanical soft                         diet today.                        - Continue present medications.                        - Use a proton pump inhibitor PO BID.                        - Resume Xarelto (rivaroxaban) at prior dose in 3  days. Refer to managing physician for further                         adjustment of therapy.                        - Refer to an oncologist at appointment to be                         scheduled. Procedure Code(s):     --- Professional ---                        [phone], Esophagogastroduodenoscopy, flexible,                         transoral; with biopsy, single or multiple Diagnosis Code(s):     --- Professional ---                        C15.5, Malignant neoplasm of lower third of esophagus                        R13.14, Dysphagia, pharyngoesophageal phase                        R63.4, Abnormal weight loss                        R93.3, Abnormal findings on diagnostic imaging of                         other parts of digestive tract CPT copyright 2022 American [employer] Association. All rights reserved. The codes documented in this report are preliminary and upon coder review may  be revised to meet current compliance requirements. Dr.  Libby Maw Toney Reil MD, MD 10/30/2022 11:42:42 AM This report has been signed electronically. Number of Addenda: 0 Note Initiated On: 10/30/2022 11:29 AM Estimated Blood Loss:  Estimated blood loss was minimal.      [facility]

## 2022-10-30 NOTE — Anesthesia Postprocedure Evaluation (Signed)
Anesthesia Post Note  Patient: Aaron Short  Procedure(s) Performed: ESOPHAGOGASTRODUODENOSCOPY (EGD) WITH PROPOFOL  Patient location during evaluation: Endoscopy Anesthesia Type: General Level of consciousness: awake and alert Pain management: pain level controlled Vital Signs Assessment: post-procedure vital signs reviewed and stable Respiratory status: spontaneous breathing, nonlabored ventilation and respiratory function stable Cardiovascular status: blood pressure returned to baseline and stable Postop Assessment: no apparent nausea or vomiting Anesthetic complications: no   No notable events documented.   Last Vitals:  Vitals:   10/30/22 1154 10/30/22 1204  BP: 120/60 118/62  Pulse:  63  Resp: 16   Temp:    SpO2: 98% 98%    Last Pain:  Vitals:   10/30/22 1154  TempSrc:   PainSc: 2                  Foye Deer

## 2022-10-31 ENCOUNTER — Telehealth: Payer: Self-pay

## 2022-10-31 ENCOUNTER — Other Ambulatory Visit: Payer: Self-pay | Admitting: Pathology

## 2022-10-31 ENCOUNTER — Encounter: Payer: Self-pay | Admitting: Gastroenterology

## 2022-10-31 DIAGNOSIS — C159 Malignant neoplasm of esophagus, unspecified: Secondary | ICD-10-CM

## 2022-10-31 LAB — SURGICAL PATHOLOGY

## 2022-10-31 NOTE — Telephone Encounter (Signed)
Patient wife [name] is calling because they decided they want to use [facility] and would like the referral placed

## 2022-10-31 NOTE — Telephone Encounter (Signed)
Yes, please go ahead and place referral to [facility] oncology Esophageal cancer

## 2022-11-01 ENCOUNTER — Telehealth: Payer: Self-pay

## 2022-11-01 ENCOUNTER — Inpatient Hospital Stay
Admission: RE | Admit: 2022-11-01 | Discharge: 2022-11-01 | Disposition: A | Discharge status: Home / Self Care | Payer: Self-pay | Source: Ambulatory Visit | Attending: Oncology | Admitting: Oncology

## 2022-11-01 ENCOUNTER — Other Ambulatory Visit: Payer: Self-pay

## 2022-11-01 DIAGNOSIS — C159 Malignant neoplasm of esophagus, unspecified: Secondary | ICD-10-CM

## 2022-11-01 NOTE — Addendum Note (Signed)
Addended by: Radene Knee L on: 11/01/2022 08:43 AM   Modules accepted: Orders

## 2022-11-01 NOTE — Telephone Encounter (Signed)
-----   Message from Toney Reil, MD sent at 10/31/2022  3:53 PM EDT ----- I was notified by the pathologist, Dr. Oneita Kras regarding pathology findings.  Called patient's wife and conveyed results to her.  She prefers referral to be sent to [facility] cancer center.  Referral will be placed and patient will be discussed in tumor board  Rohini Vanga

## 2022-11-01 NOTE — Telephone Encounter (Signed)
Placed referral to oncology and informed patient wife of the referral. She asked when they will call her to set up appointment informed her should be by 1 week but should be in a day or so. She states she hopes [facility] gave her the number so she can call them to make appointment also.

## 2022-11-01 NOTE — Telephone Encounter (Signed)
Placed referral to oncology.

## 2022-11-02 ENCOUNTER — Other Ambulatory Visit: Payer: Medicare Other

## 2022-11-03 ENCOUNTER — Encounter: Payer: Self-pay | Admitting: Oncology

## 2022-11-03 ENCOUNTER — Inpatient Hospital Stay (HOSPITAL_BASED_OUTPATIENT_CLINIC_OR_DEPARTMENT_OTHER): Payer: Medicare Other | Admitting: Hospice and Palliative Medicine

## 2022-11-03 ENCOUNTER — Other Ambulatory Visit: Payer: Self-pay

## 2022-11-03 ENCOUNTER — Inpatient Hospital Stay: Payer: Medicare Other | Attending: Oncology | Admitting: Oncology

## 2022-11-03 ENCOUNTER — Inpatient Hospital Stay: Payer: Medicare Other

## 2022-11-03 VITALS — BP 107/71 | HR 67 | Temp 97.1°F | Resp 18 | Wt 138.7 lb

## 2022-11-03 DIAGNOSIS — G893 Neoplasm related pain (acute) (chronic): Secondary | ICD-10-CM | POA: Diagnosis not present

## 2022-11-03 DIAGNOSIS — Z79899 Other long term (current) drug therapy: Secondary | ICD-10-CM | POA: Insufficient documentation

## 2022-11-03 DIAGNOSIS — C159 Malignant neoplasm of esophagus, unspecified: Secondary | ICD-10-CM

## 2022-11-03 DIAGNOSIS — R5383 Other fatigue: Secondary | ICD-10-CM | POA: Insufficient documentation

## 2022-11-03 DIAGNOSIS — I4891 Unspecified atrial fibrillation: Secondary | ICD-10-CM | POA: Insufficient documentation

## 2022-11-03 DIAGNOSIS — F17210 Nicotine dependence, cigarettes, uncomplicated: Secondary | ICD-10-CM | POA: Diagnosis not present

## 2022-11-03 DIAGNOSIS — R160 Hepatomegaly, not elsewhere classified: Secondary | ICD-10-CM

## 2022-11-03 DIAGNOSIS — Z8551 Personal history of malignant neoplasm of bladder: Secondary | ICD-10-CM | POA: Insufficient documentation

## 2022-11-03 DIAGNOSIS — M549 Dorsalgia, unspecified: Secondary | ICD-10-CM

## 2022-11-03 DIAGNOSIS — C679 Malignant neoplasm of bladder, unspecified: Secondary | ICD-10-CM | POA: Diagnosis not present

## 2022-11-03 DIAGNOSIS — Z96643 Presence of artificial hip joint, bilateral: Secondary | ICD-10-CM | POA: Insufficient documentation

## 2022-11-03 DIAGNOSIS — Z8249 Family history of ischemic heart disease and other diseases of the circulatory system: Secondary | ICD-10-CM | POA: Diagnosis not present

## 2022-11-03 DIAGNOSIS — G8929 Other chronic pain: Secondary | ICD-10-CM

## 2022-11-03 DIAGNOSIS — R634 Abnormal weight loss: Secondary | ICD-10-CM | POA: Insufficient documentation

## 2022-11-03 DIAGNOSIS — T451X5A Adverse effect of antineoplastic and immunosuppressive drugs, initial encounter: Secondary | ICD-10-CM | POA: Diagnosis not present

## 2022-11-03 DIAGNOSIS — C7802 Secondary malignant neoplasm of left lung: Secondary | ICD-10-CM | POA: Diagnosis not present

## 2022-11-03 DIAGNOSIS — Z515 Encounter for palliative care: Secondary | ICD-10-CM | POA: Diagnosis not present

## 2022-11-03 DIAGNOSIS — M542 Cervicalgia: Secondary | ICD-10-CM | POA: Diagnosis not present

## 2022-11-03 DIAGNOSIS — Z5111 Encounter for antineoplastic chemotherapy: Secondary | ICD-10-CM | POA: Insufficient documentation

## 2022-11-03 DIAGNOSIS — Z79891 Long term (current) use of opiate analgesic: Secondary | ICD-10-CM | POA: Diagnosis not present

## 2022-11-03 DIAGNOSIS — J432 Centrilobular emphysema: Secondary | ICD-10-CM | POA: Insufficient documentation

## 2022-11-03 DIAGNOSIS — E46 Unspecified protein-calorie malnutrition: Secondary | ICD-10-CM | POA: Insufficient documentation

## 2022-11-03 DIAGNOSIS — C787 Secondary malignant neoplasm of liver and intrahepatic bile duct: Secondary | ICD-10-CM | POA: Insufficient documentation

## 2022-11-03 DIAGNOSIS — Z7901 Long term (current) use of anticoagulants: Secondary | ICD-10-CM | POA: Diagnosis not present

## 2022-11-03 DIAGNOSIS — K521 Toxic gastroenteritis and colitis: Secondary | ICD-10-CM | POA: Insufficient documentation

## 2022-11-03 DIAGNOSIS — Z7189 Other specified counseling: Secondary | ICD-10-CM | POA: Insufficient documentation

## 2022-11-03 LAB — CBC WITH DIFFERENTIAL (CANCER CENTER ONLY)
Abs Immature Granulocytes: 0.02 10*3/uL (ref 0.00–0.07)
Basophils Absolute: 0.1 10*3/uL (ref 0.0–0.1)
Basophils Relative: 1 %
Eosinophils Absolute: 0.1 10*3/uL (ref 0.0–0.5)
Eosinophils Relative: 2 %
HCT: 36.6 % — ABNORMAL LOW (ref 39.0–52.0)
Hemoglobin: 11.9 g/dL — ABNORMAL LOW (ref 13.0–17.0)
Immature Granulocytes: 0 %
Lymphocytes Relative: 18 %
Lymphs Abs: 1.6 10*3/uL (ref 0.7–4.0)
MCH: 31.9 pg (ref 26.0–34.0)
MCHC: 32.5 g/dL (ref 30.0–36.0)
MCV: 98.1 fL (ref 80.0–100.0)
Monocytes Absolute: 1 10*3/uL (ref 0.1–1.0)
Monocytes Relative: 11 %
Neutro Abs: 6 10*3/uL (ref 1.7–7.7)
Neutrophils Relative %: 68 %
Platelet Count: 298 10*3/uL (ref 150–400)
RBC: 3.73 MIL/uL — ABNORMAL LOW (ref 4.22–5.81)
RDW: 14.8 % (ref 11.5–15.5)
WBC Count: 8.7 10*3/uL (ref 4.0–10.5)
nRBC: 0 % (ref 0.0–0.2)

## 2022-11-03 LAB — CMP (CANCER CENTER ONLY)
ALT: 24 U/L (ref 0–44)
AST: 59 U/L — ABNORMAL HIGH (ref 15–41)
Albumin: 3.5 g/dL (ref 3.5–5.0)
Alkaline Phosphatase: 167 U/L — ABNORMAL HIGH (ref 38–126)
Anion gap: 10 (ref 5–15)
BUN: 22 mg/dL (ref 8–23)
CO2: 30 mmol/L (ref 22–32)
Calcium: 9 mg/dL (ref 8.9–10.3)
Chloride: 98 mmol/L (ref 98–111)
Creatinine: 0.61 mg/dL (ref 0.61–1.24)
GFR, Estimated: >60 mL/min (ref >60)
Glucose, Bld: 158 mg/dL — ABNORMAL HIGH (ref 70–99)
Potassium: 4.2 mmol/L (ref 3.5–5.1)
Sodium: 138 mmol/L (ref 135–145)
Total Bilirubin: 0.3 mg/dL (ref 0.3–1.2)
Total Protein: 6.9 g/dL (ref 6.5–8.1)

## 2022-11-03 MED ORDER — ONDANSETRON HCL 8 MG PO TABS
8.0000 mg | ORAL_TABLET | Freq: Three times a day (TID) | ORAL | 1 refills | Status: DC | PRN
Start: 2022-11-03 — End: 2022-12-27

## 2022-11-03 MED ORDER — DEXAMETHASONE 4 MG PO TABS
8.0000 mg | ORAL_TABLET | Freq: Every day | ORAL | 1 refills | Status: DC
Start: 2022-11-03 — End: 2023-03-21

## 2022-11-03 MED ORDER — PROCHLORPERAZINE MALEATE 10 MG PO TABS
10.0000 mg | ORAL_TABLET | Freq: Four times a day (QID) | ORAL | 1 refills | Status: DC | PRN
Start: 2022-11-03 — End: 2022-12-27

## 2022-11-03 MED ORDER — LIDOCAINE-PRILOCAINE 2.5-2.5 % EX CREA
TOPICAL_CREAM | CUTANEOUS | 3 refills | Status: AC
Start: 2022-11-03 — End: ?

## 2022-11-03 NOTE — Assessment & Plan Note (Signed)
Non-invasive bladder cancer. Follow up with urology. Last cystoscopy in August 2023 negative.

## 2022-11-03 NOTE — Assessment & Plan Note (Signed)
Refer to nutritionist 

## 2022-11-03 NOTE — Assessment & Plan Note (Signed)
Discussed with patient. He understands that his condition is not curable, and treatment is with palliative intent.

## 2022-11-03 NOTE — Progress Notes (Signed)
[facility] requested on Specimen [id number]. Request sent to Dr. Albertina Senegal at [facility] to obtain clearance to hold Xarelto for liver biopsy. Invasive checklist sent to IR scheduling for liver biopsy and port placement. Images have been requested again from [facility].

## 2022-11-03 NOTE — Progress Notes (Signed)
START ON PATHWAY REGIMEN - Gastroesophageal     A cycle is every 14 days:     Oxaliplatin      Leucovorin      Fluorouracil      Fluorouracil   **Always confirm dose/schedule in your pharmacy ordering system**  Patient Characteristics: Distant Metastases (cM1/pM1) / Locally Recurrent Disease, Adenocarcinoma - Esophageal, GE Junction, and Gastric, First Line, HER2 Negative/Unknown, PD?L1 Expression CPS < 5/Negative/Unknown, MSS/pMMR or MSI Unknown Therapeutic Status: Distant Metastases (No Additional Staging) Histology: Adenocarcinoma Disease Classification: Esophageal Line of Therapy: First Line HER2 Status: Awaiting Test Results PD-L1 Expression Status: Awaiting Test Results Microsatellite/Mismatch Repair Status: Unknown Intent of Therapy: Non-Curative / Palliative Intent, Discussed with Patient 

## 2022-11-03 NOTE — Progress Notes (Signed)
Hematology/Oncology Consult Note [phone] [phone]     REFERRING PROVIDER: Toney Reil, MD    CHIEF COMPLAINTS/PURPOSE OF CONSULTATION:  Esophageal cancer.   ASSESSMENT & PLAN:   Cancer Staging  Esophageal adenocarcinoma [facility]) Staging form: Esophagus - Adenocarcinoma, AJCC 8th Edition - Clinical stage from 11/03/2022: Stage IVB (cTX, cNX, cM1) - Signed by Rickard Patience, MD on 11/03/2022  Malignant neoplasm of urinary bladder [facility]) Staging form: Urinary Bladder, AJCC 7th Edition - Clinical: Stage 0a (Ta, N0, M0) - Signed by Rickard Patience, MD on 11/03/2022   Esophageal adenocarcinoma [facility]) Image findings and pathology results are reviewed with patient and wife.  Clinically Stage IV esophageal adenocarcinoma.  Recommend PET scan for further evaluation of disease extent.  Recommend US guided biopsy to confirm distant metastatic disease.  Check cbc cmp CEA, send NGS and HER2 IHC staining.  I explained to the patient the risks and benefits of palliative chemotherapy including all but not limited to infusion reaction, hair loss, hearing loss, mouth sore, nausea, vomiting, low blood counts, bleeding, heart failure, kidney failure and risk of life threatening infection and even death, secondary malignancy etc.   Patient voices understanding and willing to proceed chemotherapy.   # Chemotherapy education; Medi port placement.  Antiemetics-Zofran and Compazine; EMLA cream sent to pharmacy Supportive care measures are necessary for patient well-being and will be provided as necessary. We spent sufficient time to discuss many aspect of care, questions were answered to patient's satisfaction.   Malignant neoplasm of urinary bladder (HCC) Non-invasive bladder cancer. Follow up with urology. Last cystoscopy in August 2023 negative.   Atrial fibrillation and flutter (HCC) On Xarelto for anticoagulation.  Will obtain clearance of holding anticoagulation for liver  biopsy  Long term current use of opiate analgesic Follow up with pain clinic.  Refer to palliative care.   Goals of care, counseling/discussion Discussed with patient. He understands that his condition is not curable, and treatment is with palliative intent.   Protein calorie malnutrition (HCC) Refer to nutritionist.    Orders Placed This Encounter  Procedures   Consent Attestation for Oncology Treatment    Order Specific Question:   The patient is informed of risks, benefits, side-effects of the prescribed oncology treatment. Potential short term and long term side effects and response rates discussed. After a long discussion, the patient made informed decision to proceed.    Answer:   Yes   NM PET Image Initial (PI) Skull Base To Thigh    Standing Status:   Future    Standing Expiration Date:   11/03/2023    Order Specific Question:   If indicated for the ordered procedure, I authorize the administration of a radiopharmaceutical per Radiology protocol    Answer:   Yes    Order Specific Question:   Preferred imaging location?    Answer:   [facility]   US BIOPSY (LIVER)    Standing Status:   Future    Standing Expiration Date:   11/03/2023    Order Specific Question:   Reason for exam:    Answer:   liver mass    Order Specific Question:   Preferred imaging location?    Answer:   [facility]   CMP (Cancer Center only)    Standing Status:   Future    Number of Occurrences:   1    Standing Expiration Date:   11/03/2023   CBC with Differential (Cancer Center Only)    Standing Status:   Future  Number of Occurrences:   1    Standing Expiration Date:   11/03/2023   CEA    Standing Status:   Future    Number of Occurrences:   1    Standing Expiration Date:   11/03/2023   CBC with Differential (Cancer Center Only)    Standing Status:   Future    Standing Expiration Date:   11/15/2023   CMP (Cancer Center only)    Standing Status:   Future    Standing Expiration Date:    11/15/2023   Ambulatory Referral to [facility]    Referral Priority:   Routine    Referral Type:   Consultation    Referral Reason:   Specialty Services Required    Number of Visits Requested:   1   Ambulatory Referral to Palliative Care    Referral Priority:   Routine    Referral Type:   Consultation    Referral Reason:   Advance Care Planning    Number of Visits Requested:   1   PHYSICIAN COMMUNICATION ORDER    pre/post surgery x 6 cycles    All questions were answered. The patient knows to call the clinic with any problems, questions or concerns.  Rickard Patience, MD, PhD [facility] Hematology Oncology 11/03/2022    HISTORY OF PRESENTING ILLNESS:  Aaron Short 77 y.o. male presents to establish care for esophageal adenocarcinoma.  I have reviewed his chart and materials related to his cancer extensively and collaborated history with the patient. Summary of oncologic history is as follows: Oncology History  Malignant neoplasm of urinary bladder (HCC)  10/23/2013 Initial Diagnosis   Malignant neoplasm of urinary bladder (HCC)   11/03/2022 Cancer Staging   Staging form: Urinary Bladder, AJCC 7th Edition - Clinical: Stage 0a (Ta, N0, M0) - Signed by Rickard Patience, MD on 11/03/2022   Esophageal adenocarcinoma (HCC)  10/20/2022 Imaging   CT abdomen pelvis w contrast  -Multiple bilobar hepatic lesions extending throughout most of the liver parenchyma and concerning for metastatic disease. Correlation with tissue sampling could be considered for definitive pathologic diagnosis.   - Circumferential wall thickening of the distal esophagus and gastroesophageal junction. Recommend further evaluation with endoscopy to exclude underlying malignancy. No other evidence of primary malignancy in the abdomen or pelvis although note is made that evaluation of the pelvic structures is very limited in this CT scan due to extensive artifact secondary to the patient's bilateral total hip arthroplasty.    - Cystic lesion in the pancreatic tail measuring up to 1.0 cm, possibly a sidebranch IPMN. No obvious pancreatic ductal dilatation. Further evaluation with MRI/MRCP could be considered.   - Extensive degenerative changes to the spine with compression deformities of T11, T12 and L2, similar to prior.    10/20/2022 Imaging   CT chest w contrast   New 4 mm nodule in the posterior segment of left lower lobe, 3 mm nodule in the left upper chest of pulmonary metastasis.   Stable mild centrilobular emphysema and mild peripheral reticulation which may reflect smoking-related lung fibrosis.   Mildly dilated esophagus with air-fluid level and circumferential thickening in the distal esophagus which may reflect chronic esophagitis. Consider endoscopic evaluation.    11/03/2022 Initial Diagnosis   Esophageal adenocarcinoma   + dysphagia for both liquid and solid food, cough after eating. Unintentional weight loss 50 pounds over the past years, 14 pounds within last year.   10/30/22 EGD showed A large, submucosal mass with no bleeding and stigmata  of recent bleeding was found in the lower third of the esophagus, 35 to 40 cm from the incisors. The mass was partially obstructing and circumferential. Biopsies were taken with a cold forceps for histology.  Pathology showed moderately differentiated adenocarcinoma involving squamocolumnar junctional mucosa with focal intestinal metaplasia.    11/03/2022 Cancer Staging   Staging form: Esophagus - Adenocarcinoma, AJCC 8th Edition - Clinical stage from 11/03/2022: Stage IVB (cTX, cNX, cM1) - Signed by Rickard Patience, MD on 11/03/2022 Stage prefix: Initial diagnosis    He is a current every day smoker. He denies abdominal pain, nausea vomiting. Gait is not steady, frequent falls.  Aifb on Xarelto. He currently is able to eat soft moist food.  + chronic neck and back pain, joint pain, He has most recently been followed by [facility] with Dr. Cherylann Ratel.  Patient  describes pain as being recently worse associated with several recent falls. He has been on Percocet 10-325mg  patient reports that he has been taking 2-3 tablets every 6 hours given the intensity of pain.  MEDICAL HISTORY:  Past Medical History:  Diagnosis Date   Arthralgia of lower leg 04/02/2012   Arthralgia of upper arm 06/18/2009   Cancer (HCC)    Diabetes mellitus without complication (HCC)    FH: atrial fibrillation    Hypertension     SURGICAL HISTORY: Past Surgical History:  Procedure Laterality Date   bladder cancer surgery  2015   ESOPHAGOGASTRODUODENOSCOPY (EGD) WITH PROPOFOL N/A 10/30/2022   Procedure: ESOPHAGOGASTRODUODENOSCOPY (EGD) WITH PROPOFOL;  Surgeon: Toney Reil, MD;  Location: [facility] ENDOSCOPY;  Service: Gastroenterology;  Laterality: N/A;   JOINT REPLACEMENT     REPLACEMENT TOTAL HIP W/  RESURFACING IMPLANTS Bilateral    SHOULDER SURGERY Bilateral    TOTAL KNEE ARTHROPLASTY Bilateral     SOCIAL HISTORY: Social History   Socioeconomic History   Marital status: Married    Spouse name: Not on file   Number of children: Not on file   Years of education: Not on file   Highest education level: Not on file  Occupational History   Not on file  Tobacco Use   Smoking status: Every Day    Packs/day: .5    Types: Cigarettes   Smokeless tobacco: Never  Vaping Use   Vaping Use: Never used  Substance and Sexual Activity   Alcohol use: No    Alcohol/week: 0.0 standard drinks of alcohol   Drug use: No   Sexual activity: Not on file  Other Topics Concern   Not on file  Social History Narrative   Not on file   Social Determinants of Health   Financial Resource Strain: Not on file  Food Insecurity: Not on file  Transportation Needs: Not on file  Physical Activity: Not on file  Stress: Not on file  Social Connections: Not on file  Intimate Partner Violence: Not on file    FAMILY HISTORY: Family History  Problem Relation Age of Onset   Dementia  Mother    Heart disease Father     ALLERGIES:  is allergic to diltiazem, penicillins, and tizanidine.  MEDICATIONS:  Current Outpatient Medications  Medication Sig Dispense Refill   acetaminophen (TYLENOL) 500 MG tablet Take 500 mg by mouth every 8 (eight) hours as needed.     B Complex-C (VITAMIN B + C COMPLEX) TABS Take 1 capsule by mouth every morning.     carisoprodol (SOMA) 350 MG tablet Take 350 mg by mouth 2 (two) times daily.  Cetirizine HCl 10 MG CAPS Take 1 capsule by mouth every morning.     Magnesium 500 MG TABS Take 500 mg by mouth as needed.     melatonin (MELATONIN MAXIMUM STRENGTH) 5 MG TABS Take 5 mg by mouth at bedtime as needed.     omeprazole (PRILOSEC) 10 MG capsule Take 20 mg by mouth daily.     ondansetron (ZOFRAN) 8 MG tablet Take by mouth.     oxyCODONE-acetaminophen (PERCOCET) 10-325 MG tablet Take 1 tablet by mouth every 6 (six) hours as needed for pain. 120 tablet 0   [START ON 11/20/2022] oxyCODONE-acetaminophen (PERCOCET) 10-325 MG tablet Take 1 tablet by mouth every 6 (six) hours as needed for pain. 120 tablet 0   [START ON 12/20/2022] oxyCODONE-acetaminophen (PERCOCET) 10-325 MG tablet Take 1 tablet by mouth every 6 (six) hours as needed for pain. 120 tablet 0   prazosin (MINIPRESS) 1 MG capsule Take 1 mg by mouth at bedtime.     rivaroxaban (XARELTO) 20 MG TABS tablet Take 20 mg by mouth.     rosuvastatin (CRESTOR) 20 MG tablet Take 20 mg by mouth daily.     sertraline (ZOLOFT) 50 MG tablet Take by mouth.     naloxone (NARCAN) 4 MG/0.1ML LIQD nasal spray kit Place 1 spray into the nose once for 1 dose. Spray half of bottle content into each nostril, then call 911 (Patient not taking: Reported on 11/03/2022) 2 each 0   No current facility-administered medications for this visit.    Review of Systems  Constitutional:  Positive for appetite change, fatigue and unexpected weight change. Negative for chills and fever.  HENT:   Negative for hearing loss and  voice change.   Eyes:  Negative for eye problems and icterus.  Respiratory:  Negative for chest tightness, cough and shortness of breath.   Cardiovascular:  Negative for chest pain and leg swelling.  Gastrointestinal:  Negative for abdominal distention and abdominal pain.       Heart burn  Endocrine: Negative for hot flashes.  Genitourinary:  Negative for difficulty urinating, dysuria and frequency.   Musculoskeletal:  Positive for arthralgias, back pain, gait problem and neck pain.  Skin:  Negative for itching and rash.  Neurological:  Positive for gait problem. Negative for light-headedness and numbness.       Falls.   Hematological:  Negative for adenopathy. Does not bruise/bleed easily.  Psychiatric/Behavioral:  Negative for confusion.      PHYSICAL EXAMINATION: ECOG PERFORMANCE STATUS: 1 - Symptomatic but completely ambulatory  Vitals:   11/03/22 1104  BP: 107/71  Pulse: 67  Resp: 18  Temp: (!) 97.1 F (36.2 C)  SpO2: 97%   Filed Weights   11/03/22 1104  Weight: 138 lb 11.2 oz (62.9 kg)    Physical Exam Constitutional:      General: He is not in acute distress.    Appearance: He is ill-appearing. He is not diaphoretic.  HENT:     Head: Normocephalic and atraumatic.  Eyes:     General: No scleral icterus.    Pupils: Pupils are equal, round, and reactive to light.  Cardiovascular:     Rate and Rhythm: Normal rate and regular rhythm.     Heart sounds: No murmur heard. Pulmonary:     Effort: Pulmonary effort is normal. No respiratory distress.     Breath sounds: Normal breath sounds. No wheezing.  Abdominal:     General: There is no distension.     Palpations: Abdomen  is soft.     Tenderness: There is no abdominal tenderness.  Musculoskeletal:        General: Normal range of motion.     Cervical back: Normal range of motion and neck supple.  Skin:    General: Skin is warm and dry.     Findings: No erythema.  Neurological:     Mental Status: He is alert and  oriented to person, place, and time. Mental status is at baseline.     Cranial Nerves: No cranial nerve deficit.     Motor: No abnormal muscle tone.  Psychiatric:        Mood and Affect: Mood and affect normal.     LABORATORY DATA:  I have reviewed the data as listed     No data to display            Latest Ref Rng & Units 02/10/2019    2:08 PM 11/18/2015    9:35 AM  CMP  Glucose 65 - 99 mg/dL 161  096   BUN 8 - 27 mg/dL 11  15   Creatinine 0.45 - 1.27 mg/dL 4.09  8.11   Sodium 914 - 144 mmol/L 141  139   Potassium 3.5 - 5.2 mmol/L 4.9  4.3   Chloride 96 - 106 mmol/L 102  103   CO2 22 - 32 mmol/L  29   Calcium 8.6 - 10.2 mg/dL 9.4  9.3   Total Protein 6.0 - 8.5 g/dL 6.4  7.2   Total Bilirubin 0.0 - 1.2 mg/dL 0.3  0.4   Alkaline Phos 39 - 117 IU/L 89  68   AST 0 - 40 IU/L 15  19   ALT 17 - 63 U/L  17      RADIOGRAPHIC STUDIES: I have personally reviewed the radiological images as listed and agreed with the findings in the report. No results found.

## 2022-11-03 NOTE — Assessment & Plan Note (Signed)
Follow up with pain clinic.  Refer to palliative care.

## 2022-11-03 NOTE — Assessment & Plan Note (Addendum)
Image findings and pathology results are reviewed with patient and wife.  Clinically Stage IV esophageal adenocarcinoma.  Recommend PET scan for further evaluation of disease extent.  Recommend US guided biopsy to confirm distant metastatic disease.  Check cbc cmp CEA, send NGS and HER2 IHC staining.  I explained to the patient the risks and benefits of palliative chemotherapy including all but not limited to infusion reaction, hair loss, hearing loss, mouth sore, nausea, vomiting, low blood counts, bleeding, heart failure, kidney failure and risk of life threatening infection and even death, secondary malignancy etc.   Patient voices understanding and willing to proceed chemotherapy.   # Chemotherapy education; Medi port placement.  Antiemetics-Zofran and Compazine; EMLA cream sent to pharmacy Supportive care measures are necessary for patient well-being and will be provided as necessary. We spent sufficient time to discuss many aspect of care, questions were answered to patient's satisfaction.

## 2022-11-03 NOTE — Assessment & Plan Note (Signed)
On Xarelto for anticoagulation.  Will obtain clearance of holding anticoagulation for liver biopsy

## 2022-11-03 NOTE — Progress Notes (Signed)
Palliative Medicine [facility] at [facility] [phone] [phone]   Name: Aaron Short Date: 11/03/2022 MRN: [id number]  DOB: May 31, 1945  Patient Care Team: Rosemarie Ax, MD as PCP - General Benita Gutter, RN as Oncology Nurse Navigator    REASON FOR CONSULTATION: Aaron Short is a 77 y.o. male with multiple medical problems including chronic pain followed by pain clinic, recently diagnosed with stage IV esophageal adenocarcinoma.  Palliative care was consulted to address goals and manage ongoing symptoms.   SOCIAL HISTORY:     reports that he has been smoking cigarettes. He has been smoking an average of .5 packs per day. He has never used smokeless tobacco. He reports that he does not drink alcohol and does not use drugs.  Patient is married and lives at home with his wife.  ADVANCE DIRECTIVES:    CODE STATUS:   PAST MEDICAL HISTORY: Past Medical History:  Diagnosis Date   Arthralgia of lower leg 04/02/2012   Arthralgia of upper arm 06/18/2009   Cancer (HCC)    Diabetes mellitus without complication (HCC)    FH: atrial fibrillation    Hypertension     PAST SURGICAL HISTORY:  Past Surgical History:  Procedure Laterality Date   bladder cancer surgery  2015   ESOPHAGOGASTRODUODENOSCOPY (EGD) WITH PROPOFOL N/A 10/30/2022   Procedure: ESOPHAGOGASTRODUODENOSCOPY (EGD) WITH PROPOFOL;  Surgeon: Toney Reil, MD;  Location: [facility] ENDOSCOPY;  Service: Gastroenterology;  Laterality: N/A;   JOINT REPLACEMENT     REPLACEMENT TOTAL HIP W/  RESURFACING IMPLANTS Bilateral    SHOULDER SURGERY Bilateral    TOTAL KNEE ARTHROPLASTY Bilateral     HEMATOLOGY/ONCOLOGY HISTORY:  Oncology History  Malignant neoplasm of urinary bladder (HCC)  10/23/2013 Initial Diagnosis   Malignant neoplasm of urinary bladder (HCC)   11/03/2022 Cancer Staging   Staging form: Urinary Bladder, AJCC 7th Edition - Clinical: Stage 0a  (Ta, N0, M0) - Signed by Rickard Patience, MD on 11/03/2022   Esophageal adenocarcinoma (HCC)  10/20/2022 Imaging   CT abdomen pelvis w contrast  -Multiple bilobar hepatic lesions extending throughout most of the liver parenchyma and concerning for metastatic disease. Correlation with tissue sampling could be considered for definitive pathologic diagnosis.   - Circumferential wall thickening of the distal esophagus and gastroesophageal junction. Recommend further evaluation with endoscopy to exclude underlying malignancy. No other evidence of primary malignancy in the abdomen or pelvis although note is made that evaluation of the pelvic structures is very limited in this CT scan due to extensive artifact secondary to the patient's bilateral total hip arthroplasty.   - Cystic lesion in the pancreatic tail measuring up to 1.0 cm, possibly a sidebranch IPMN. No obvious pancreatic ductal dilatation. Further evaluation with MRI/MRCP could be considered.   - Extensive degenerative changes to the spine with compression deformities of T11, T12 and L2, similar to prior.    10/20/2022 Imaging   CT chest w contrast   New 4 mm nodule in the posterior segment of left lower lobe, 3 mm nodule in the left upper chest of pulmonary metastasis.   Stable mild centrilobular emphysema and mild peripheral reticulation which may reflect smoking-related lung fibrosis.   Mildly dilated esophagus with air-fluid level and circumferential thickening in the distal esophagus which may reflect chronic esophagitis. Consider endoscopic evaluation.    11/03/2022 Initial Diagnosis   Esophageal adenocarcinoma (HCC)     ALLERGIES:  is allergic to diltiazem, penicillins, and tizanidine.  MEDICATIONS:  Current Outpatient  Medications  Medication Sig Dispense Refill   acetaminophen (TYLENOL) 500 MG tablet Take 500 mg by mouth every 8 (eight) hours as needed.     B Complex-C (VITAMIN B + C COMPLEX) TABS Take 1 capsule by mouth every  morning.     carisoprodol (SOMA) 350 MG tablet Take 350 mg by mouth 2 (two) times daily.      Cetirizine HCl 10 MG CAPS Take 1 capsule by mouth every morning.     Magnesium 500 MG TABS Take 500 mg by mouth as needed.     melatonin (MELATONIN MAXIMUM STRENGTH) 5 MG TABS Take 5 mg by mouth at bedtime as needed.     naloxone (NARCAN) 4 MG/0.1ML LIQD nasal spray kit Place 1 spray into the nose once for 1 dose. Spray half of bottle content into each nostril, then call 911 (Patient not taking: Reported on 11/03/2022) 2 each 0   omeprazole (PRILOSEC) 10 MG capsule Take 20 mg by mouth daily.     ondansetron (ZOFRAN) 8 MG tablet Take by mouth.     oxyCODONE-acetaminophen (PERCOCET) 10-325 MG tablet Take 1 tablet by mouth every 6 (six) hours as needed for pain. 120 tablet 0   [START ON 11/20/2022] oxyCODONE-acetaminophen (PERCOCET) 10-325 MG tablet Take 1 tablet by mouth every 6 (six) hours as needed for pain. 120 tablet 0   [START ON 12/20/2022] oxyCODONE-acetaminophen (PERCOCET) 10-325 MG tablet Take 1 tablet by mouth every 6 (six) hours as needed for pain. 120 tablet 0   prazosin (MINIPRESS) 1 MG capsule Take 1 mg by mouth at bedtime.     rivaroxaban (XARELTO) 20 MG TABS tablet Take 20 mg by mouth.     rosuvastatin (CRESTOR) 20 MG tablet Take 20 mg by mouth daily.     sertraline (ZOLOFT) 50 MG tablet Take by mouth.     No current facility-administered medications for this visit.    VITAL SIGNS: There were no vitals taken for this visit. There were no vitals filed for this visit.  Estimated body mass index is 22.39 kg/m as calculated from the following:   Height as of 10/30/22: 5\' 6"  (1.676 m).   Weight as of an earlier encounter on 11/03/22: 138 lb 11.2 oz (62.9 kg).  LABS: CBC:    Component Value Date/Time   WBC 8.7 11/03/2022 1255   HGB 11.9 (L) 11/03/2022 1255   HCT 36.6 (L) 11/03/2022 1255   PLT 298 11/03/2022 1255   MCV 98.1 11/03/2022 1255   NEUTROABS 6.0 11/03/2022 1255   LYMPHSABS 1.6  11/03/2022 1255   MONOABS 1.0 11/03/2022 1255   EOSABS 0.1 11/03/2022 1255   BASOSABS 0.1 11/03/2022 1255   Comprehensive Metabolic Panel:    Component Value Date/Time   NA 138 11/03/2022 1255   NA 141 02/10/2019 1408   K 4.2 11/03/2022 1255   CL 98 11/03/2022 1255   CO2 30 11/03/2022 1255   BUN 22 11/03/2022 1255   BUN 11 02/10/2019 1408   CREATININE 0.61 11/03/2022 1255   GLUCOSE 158 (H) 11/03/2022 1255   CALCIUM 9.0 11/03/2022 1255   AST 59 (H) 11/03/2022 1255   ALT 24 11/03/2022 1255   ALKPHOS 167 (H) 11/03/2022 1255   BILITOT 0.3 11/03/2022 1255   PROT 6.9 11/03/2022 1255   PROT 6.4 02/10/2019 1408   ALBUMIN 3.5 11/03/2022 1255   ALBUMIN 4.0 02/10/2019 1408    RADIOGRAPHIC STUDIES: No results found.  PERFORMANCE STATUS (ECOG) : 2 - Symptomatic, <50% confined to bed  Review of Systems Unless otherwise noted, a complete review of systems is negative.  Physical Exam General: NAD Pulmonary: Unlabored Extremities: no edema, no joint deformities Skin: no rashes Neurological: Weakness but otherwise nonfocal  IMPRESSION: Patient was an add-on to my clinic schedule today.  He was seen today by Dr. Cathie Hoops with recent diagnosis of esophageal adenocarcinoma.  Patient is pending PET scan with plan to start chemotherapy.  Patient has had history of chronic back and joint pain requiring opioids for decades.  He has most recently been followed by [facility] with Dr. Cherylann Ratel.  Patient describes pain as being recently worse associated with several recent falls.  Patient reportedly has recent fractures to left ribs.  He has been on Percocet 10-325mg  patient reports that he has been taking 2-3 tablets every 6 hours given the intensity of pain.  He denies any adverse effects from pain medications.  He reports that the efficacy of the Percocet is short-lived.  Would be reasonable to consider starting patient on a long-acting opioid.  However, patient is under pain contract with Dr.  Cherylann Ratel and so we will therefore reach out to him prior to making any adjustments with patient's pain regimen.  PLAN: -Continue plan for workup/treatment -Continue Percocet as needed for pain -Consider starting long-acting opioid but will first discussed with Dr. Cherylann Ratel -Follow-up telephone visit 1 to 2 weeks   Patient expressed understanding and was in agreement with this plan. He also understands that He can call the clinic at any time with any questions, concerns, or complaints.     Time Total: 15 minutes  Visit consisted of counseling and education dealing with the complex and emotionally intense issues of symptom management and palliative care in the setting of serious and potentially life-threatening illness.Greater than 50%  of this time was spent counseling and coordinating care related to the above assessment and plan.  Signed by: Laurette Schimke, PhD, NP-C

## 2022-11-04 LAB — CEA: CEA: 145 ng/mL — ABNORMAL HIGH (ref 0.0–4.7)

## 2022-11-08 ENCOUNTER — Telehealth: Payer: Self-pay

## 2022-11-08 NOTE — Progress Notes (Signed)
Oley Balm, MD sent to [facility] PROCEDURE / BIOPSY REVIEW Date: 11/08/22  Requested Biopsy site: liver Reason for request: mets Imaging review: Best seen on CT 10/20/22  Decision: Approved Imaging modality to perform: [place] Schedule with: Moderate Sedation Schedule for: Any VIR  Additional comments:   Please contact me with questions, concerns, or if issue pertaining to this request arise.  Dayne Oley Balm, MD [facility]

## 2022-11-08 NOTE — Telephone Encounter (Signed)
Spoke with spouse, [name]. We have reviewed multiple upcoming appointments with instructions. She is aware to call us with any needs.

## 2022-11-09 ENCOUNTER — Telehealth: Payer: Self-pay

## 2022-11-09 ENCOUNTER — Other Ambulatory Visit: Payer: Self-pay | Admitting: Oncology

## 2022-11-09 ENCOUNTER — Ambulatory Visit
Admission: RE | Admit: 2022-11-09 | Discharge: 2022-11-09 | Disposition: A | Discharge status: Home / Self Care | Payer: Medicare Other | Source: Ambulatory Visit | Attending: Physician Assistant | Admitting: Physician Assistant

## 2022-11-09 DIAGNOSIS — R1310 Dysphagia, unspecified: Secondary | ICD-10-CM | POA: Diagnosis present

## 2022-11-09 DIAGNOSIS — R059 Cough, unspecified: Secondary | ICD-10-CM | POA: Insufficient documentation

## 2022-11-09 MED ORDER — ALPRAZOLAM 0.5 MG PO TABS: 0.5000 mg | ORAL_TABLET | ORAL | 0 refills | Status: DC

## 2022-11-09 NOTE — Therapy (Signed)
Modified Barium Swallow Study  Patient Details  Name: Aaron Short MRN: [id number] Date of Birth: 02/27/1946  Today's Date: 11/09/2022  Modified Barium Swallow completed.  Full report located under Chart Review in the Imaging Section.  History of Present Illness Aaron Short is a 77 y.o. male with multiple medical problems including chronic pain followed by pain clinic, recently diagnosed with stage IV esophageal adenocarcinoma. Pt followed by Oncology and Palliative Care. Pt with plan for PET scan 11/10/22. Pt with subjective report of n/v with meals and "coughing" with thin liquids.   Clinical Impression Pt presents with at least mild oropharyngeal dysphagia. Pt with incomplete mastication of solids resulting in pharyngeal stasis requiring spontaneous secondary swallows to clear. Oral and pharyngeal residual was most appreciate with increased viscocity and cleared with spontaneous secondary swallows. Pt with before the swallow aspiration of both nectar-thick liquids and thin liquid as well as before/during the swallow penetration of thin liquids. Chin tuck posture seemed effective in reducing risk of before the swallow aspiration of thin liquids. Above mentioned deficits likely due to: reduced base of tongue retraction, reduced hyolaryngeal elevation/excursion, reduced/mistimed laryngeal vestibule closure, reduced pharyngeal stripping wave, and ineffective cough response. Deficits likely exacerbated by xerostomia and suspected cervical osteophytes. Recommend a regular diet (throroughly masticated) with thin liquids with use of chin tuck with ALL liquid intake. Pt may benefit from RD consult given unintentional weightloss and plan for cancer treatment.  Factors that may increase risk of adverse event in presence of aspiration Aaron Short & Aaron Short 2021): Frail or deconditioned;Frequent aspiration of large volumes;Poor general health and/or compromised immunity  Swallow Evaluation  Recommendations Recommendations: PO diet PO Diet Recommendation: Regular Liquid Administration via: No straw;Cup Medication Administration: Crushed with puree Supervision: Patient able to self-feed Swallowing strategies  : Slow rate;Small bites/sips;Follow solids with liquids;Chin tuck Postural changes: Position pt fully upright for meals;Stay upright 30-60 min after meals Oral care recommendations: Oral care QID (4x/day) Recommended consults: Consider dietitian consultation;Consider Palliative care     Aaron Short, M.S., CCC-SLP Speech-Language Pathologist [facility] [phone] (ASCOM)   Aaron Short 11/09/2022,1:53 PM

## 2022-11-09 NOTE — Telephone Encounter (Signed)
Called and spoke to spouse, [name]. IR has been able to arrange liver biopsy same day as port placement. Reviewed new arrival time and Xarelto hold for Sunday. She voiced concern regarding upcoming PET. She has requested something to help him relax or additional pain medication. She is worried he will not be able to lay on the table for that amount of time. Notification sent to medical oncology and palliative care.

## 2022-11-10 ENCOUNTER — Telehealth: Payer: Self-pay

## 2022-11-10 ENCOUNTER — Inpatient Hospital Stay: Payer: Medicare Other

## 2022-11-10 ENCOUNTER — Ambulatory Visit
Admission: RE | Admit: 2022-11-10 | Discharge: 2022-11-10 | Disposition: A | Discharge status: Home / Self Care | Payer: Medicare Other | Source: Ambulatory Visit | Attending: Oncology | Admitting: Oncology

## 2022-11-10 ENCOUNTER — Telehealth: Payer: Self-pay | Admitting: Gastroenterology

## 2022-11-10 ENCOUNTER — Other Ambulatory Visit: Payer: Self-pay | Admitting: Student

## 2022-11-10 DIAGNOSIS — C159 Malignant neoplasm of esophagus, unspecified: Secondary | ICD-10-CM

## 2022-11-10 LAB — GLUCOSE, CAPILLARY: Glucose-Capillary: 76 mg/dL (ref 70–99)

## 2022-11-10 MED ORDER — FLUDEOXYGLUCOSE F - 18 (FDG) INJECTION
7.6700 | Freq: Once | INTRAVENOUS | Status: AC | PRN
  Administered 2022-11-10: 7.67 via INTRAVENOUS

## 2022-11-10 NOTE — Telephone Encounter (Signed)
Noted results of MBS, he might benefit from surgical G tube Will defer to Dr Cathie Hoops about G tube based on his overall prognosis of esophageal cancer and family's decision  Thanks RV

## 2022-11-10 NOTE — Telephone Encounter (Signed)
  Left message for patient to return call to office.   Notify patient modified barium swallow test shows aspiration of thin liquids and nectar consistency.  I recommend patient follow-up with speech pathologist to help with swallowing.  Try to drink thicker consistencies and avoid really thin liquids.  Of note, patient recently diagnosed with esophageal cancer and has been referred for further treatment.

## 2022-11-10 NOTE — Telephone Encounter (Signed)
-----   Message from Celso Amy, [place] sent at 11/10/2022 11:14 AM EDT ----- Notify patient modified barium swallow test shows aspiration of thin liquids and nectar consistency.  I recommend patient follow-up with speech pathologist to help with swallowing.  Try to drink thicker consistencies and avoid really thin liquids.  Of note, patient recently diagnosed with esophageal cancer and has been referred for further treatment.

## 2022-11-10 NOTE — Telephone Encounter (Signed)
Received message from PET department stating that PET had to be r/s due power outage.   Per MD, keep pet and port placement on 6/17. Cancel [place] bx. IR informed and bx has been cancelled

## 2022-11-12 ENCOUNTER — Other Ambulatory Visit: Payer: Self-pay | Admitting: Student

## 2022-11-13 ENCOUNTER — Encounter
Admission: RE | Admit: 2022-11-13 | Discharge: 2022-11-13 | Disposition: A | Discharge status: Home / Self Care | Payer: Medicare Other | Source: Ambulatory Visit | Attending: Oncology | Admitting: Oncology

## 2022-11-13 ENCOUNTER — Ambulatory Visit: Payer: Medicare Other | Admitting: Radiology

## 2022-11-13 ENCOUNTER — Ambulatory Visit
Admission: RE | Admit: 2022-11-13 | Discharge: 2022-11-13 | Disposition: A | Discharge status: Home / Self Care | Payer: Medicare Other | Source: Ambulatory Visit | Attending: Oncology | Admitting: Oncology

## 2022-11-13 ENCOUNTER — Ambulatory Visit: Admission: RE | Admit: 2022-11-13 | Payer: Medicare Other | Source: Ambulatory Visit | Admitting: Radiology

## 2022-11-13 ENCOUNTER — Other Ambulatory Visit: Payer: Medicare Other

## 2022-11-13 ENCOUNTER — Encounter: Payer: Self-pay | Admitting: Radiology

## 2022-11-13 ENCOUNTER — Other Ambulatory Visit: Payer: Self-pay

## 2022-11-13 DIAGNOSIS — I4891 Unspecified atrial fibrillation: Secondary | ICD-10-CM | POA: Diagnosis not present

## 2022-11-13 DIAGNOSIS — C159 Malignant neoplasm of esophagus, unspecified: Secondary | ICD-10-CM | POA: Insufficient documentation

## 2022-11-13 DIAGNOSIS — F17210 Nicotine dependence, cigarettes, uncomplicated: Secondary | ICD-10-CM | POA: Diagnosis not present

## 2022-11-13 DIAGNOSIS — I10 Essential (primary) hypertension: Secondary | ICD-10-CM | POA: Diagnosis not present

## 2022-11-13 DIAGNOSIS — E119 Type 2 diabetes mellitus without complications: Secondary | ICD-10-CM | POA: Insufficient documentation

## 2022-11-13 DIAGNOSIS — R160 Hepatomegaly, not elsewhere classified: Secondary | ICD-10-CM | POA: Insufficient documentation

## 2022-11-13 HISTORY — PX: IR IMAGING GUIDED PORT INSERTION: IMG5740

## 2022-11-13 LAB — GLUCOSE, CAPILLARY: Glucose-Capillary: 93 mg/dL (ref 70–99)

## 2022-11-13 MED ORDER — MIDAZOLAM HCL 5 MG/5ML IJ SOLN
INTRAMUSCULAR | Status: AC | PRN
  Administered 2022-11-13: 1 mg via INTRAVENOUS

## 2022-11-13 MED ORDER — LIDOCAINE-EPINEPHRINE 1 %-1:100000 IJ SOLN
INTRAMUSCULAR | Status: AC
  Filled 2022-11-13: qty 1

## 2022-11-13 MED ORDER — HEPARIN SOD (PORK) LOCK FLUSH 100 UNIT/ML IV SOLN
INTRAVENOUS | Status: AC
  Filled 2022-11-13: qty 5

## 2022-11-13 MED ORDER — LIDOCAINE-EPINEPHRINE 1 %-1:100000 IJ SOLN
16.0000 mL | Freq: Once | INTRAMUSCULAR | Status: AC
  Administered 2022-11-13: 16 mL via INTRADERMAL

## 2022-11-13 MED ORDER — HEPARIN SOD (PORK) LOCK FLUSH 100 UNIT/ML IV SOLN
500.0000 [IU] | Freq: Once | INTRAVENOUS | Status: AC
  Administered 2022-11-13: 500 [IU] via INTRAVENOUS

## 2022-11-13 MED ORDER — FENTANYL CITRATE (PF) 100 MCG/2ML IJ SOLN
INTRAMUSCULAR | Status: AC
  Filled 2022-11-13: qty 2

## 2022-11-13 MED ORDER — SODIUM CHLORIDE 0.9 % IV SOLN: INTRAVENOUS | Status: DC

## 2022-11-13 MED ORDER — FLUDEOXYGLUCOSE F - 18 (FDG) INJECTION
7.6500 | Freq: Once | INTRAVENOUS | Status: AC | PRN
  Administered 2022-11-13: 7.65 via INTRAVENOUS

## 2022-11-13 MED ORDER — FENTANYL CITRATE (PF) 100 MCG/2ML IJ SOLN
INTRAMUSCULAR | Status: AC | PRN
  Administered 2022-11-13: 50 ug via INTRAVENOUS
  Administered 2022-11-13: 25 ug via INTRAVENOUS

## 2022-11-13 MED ORDER — MIDAZOLAM HCL 2 MG/2ML IJ SOLN
INTRAMUSCULAR | Status: AC
  Filled 2022-11-13: qty 2

## 2022-11-13 NOTE — Procedures (Signed)
Interventional Radiology Procedure Note  Procedure: Placement of a right IJ approach single lumen PowerPort.  Tip is positioned at the superior cavoatrial junction and catheter is ready for immediate use.  Complications: No immediate Recommendations:  - Ok to shower tomorrow - Do not submerge for 7 days - Routine line care   Signed,  Lacie Landry K. Keirstyn Aydt, MD   

## 2022-11-13 NOTE — H&P (Signed)
Chief Complaint: Patient was seen in consultation today for port-a-catheter placement.   Referring Physician(s): [facility]  Supervising Physician: [facility]  Patient Status: [facility] - Out-pt  History of Present Illness: Aaron Short is a 77 y.o. male with a medical history significant for DM, HTN, atrial fibrillation (Xarelto), bladder cancer (2015) and recently diagnosed esophageal cancer. He was referred to [facility] May 2024 for evaluation of nausea, heartburn, weight loss and dysphagia. Work up including CT, PET and upper endoscopy was positive for a partially obstructing mass in the lower third of the esophagus. Imaging also showed liver metastases. Pathology from the esophageal mass showed adenocarcinoma. His oncology team is preparing him for palliative chemotherapy.   Interventional Radiology has been asked to evaluate this patient for an image-guided port-a-catheter placement to facilitate his treatment plans.   Past Medical History:  Diagnosis Date   Arthralgia of lower leg 04/02/2012   Arthralgia of upper arm 06/18/2009   Cancer (HCC)    Diabetes mellitus without complication (HCC)    FH: atrial fibrillation    Hypertension     Past Surgical History:  Procedure Laterality Date   bladder cancer surgery  2015   ESOPHAGOGASTRODUODENOSCOPY (EGD) WITH PROPOFOL N/A 10/30/2022   Procedure: ESOPHAGOGASTRODUODENOSCOPY (EGD) WITH PROPOFOL;  Surgeon: Aaron Reil, MD;  Location: [facility];  Service: [facility];  Laterality: N/A;   JOINT REPLACEMENT     REPLACEMENT TOTAL HIP W/  RESURFACING IMPLANTS Bilateral    SHOULDER SURGERY Bilateral    TOTAL KNEE ARTHROPLASTY Bilateral     Allergies: Diltiazem, Penicillins, and Tizanidine  Medications: Prior to Admission medications   Medication Sig Start Date End Date Taking? Authorizing Provider  acetaminophen (TYLENOL) 500 MG tablet Take 500 mg by mouth every 8 (eight) hours as needed.     [provider]  ALPRAZolam Prudy Feeler) 0.5 MG tablet Take 1 tablet (0.5 mg total) by mouth See admin instructions. Take 1 tablet 30 minutes prior to imaging procedure, may take another tablet if needed. 11/09/22   Aaron Patience, MD  B Complex-C (VITAMIN B + C COMPLEX) TABS Take 1 capsule by mouth every morning.    [provider]  carisoprodol (SOMA) 350 MG tablet Take 350 mg by mouth 2 (two) times daily.     [provider]  Cetirizine HCl 10 MG CAPS Take 1 capsule by mouth every morning.    [provider]  dexamethasone (DECADRON) 4 MG tablet Take 2 tablets (8 mg total) by mouth daily. Start the day after chemotherapy for 2 days. Take with food. 11/03/22   Aaron Patience, MD  lidocaine-prilocaine (EMLA) cream Apply to affected area once 11/03/22   Aaron Patience, MD  Magnesium 500 MG TABS Take 500 mg by mouth as needed.    [provider]  melatonin (MELATONIN MAXIMUM STRENGTH) 5 MG TABS Take 5 mg by mouth at bedtime as needed.    [provider]  naloxone [facility]) 4 MG/0.1ML LIQD nasal spray kit Place 1 spray into the nose once for 1 dose. Spray half of bottle content into each nostril, then call 911 Patient not taking: Reported on 11/03/2022 12/31/18 01/19/22  Aaron Metz, MD  omeprazole (PRILOSEC) 10 MG capsule Take 20 mg by mouth daily.    [provider]  ondansetron (ZOFRAN) 8 MG tablet Take by mouth. 10/10/22   [provider]  ondansetron (ZOFRAN) 8 MG tablet Take 1 tablet (8 mg total) by mouth every 8 (eight) hours as needed for nausea or vomiting. Start  on the third day after chemotherapy. 11/03/22   Aaron Patience, MD  oxyCODONE-acetaminophen (PERCOCET) 10-325 MG tablet Take 1 tablet by mouth every 6 (six) hours as needed for pain. 10/21/22 11/20/22  Aaron Jolly, MD  oxyCODONE-acetaminophen (PERCOCET) 10-325 MG tablet Take 1 tablet by mouth every 6 (six) hours as needed for pain. 11/20/22 12/20/22  Aaron Jolly, MD  oxyCODONE-acetaminophen  (PERCOCET) 10-325 MG tablet Take 1 tablet by mouth every 6 (six) hours as needed for pain. 12/20/22 01/19/23  Aaron Jolly, MD  prazosin (MINIPRESS) 1 MG capsule Take 1 mg by mouth at bedtime. 10/01/20   [provider]  prochlorperazine (COMPAZINE) 10 MG tablet Take 1 tablet (10 mg total) by mouth every 6 (six) hours as needed for nausea or vomiting. 11/03/22   Aaron Patience, MD  rivaroxaban (XARELTO) 20 MG TABS tablet Take 20 mg by mouth. 03/26/17   [provider]  rosuvastatin (CRESTOR) 20 MG tablet Take 20 mg by mouth daily.    [provider]  sertraline (ZOLOFT) 50 MG tablet Take by mouth. 10/05/22 01/07/23  [provider]     Family History  Problem Relation Age of Onset   Dementia Mother    Heart disease Father     Social History   Socioeconomic History   Marital status: Married    Spouse name: Not on file   Number of children: Not on file   Years of education: Not on file   Highest education level: Not on file  Occupational History   Not on file  Tobacco Use   Smoking status: Every Day    Packs/day: .5    Types: Cigarettes   Smokeless tobacco: Never  Vaping Use   Vaping Use: Never used  Substance and Sexual Activity   Alcohol use: No    Alcohol/week: 0.0 standard drinks of alcohol   Drug use: No   Sexual activity: Not on file  Other Topics Concern   Not on file  Social History Narrative   Lives with wife, Aaron [facility].    Social Determinants of Health   Financial Resource Strain: Low Risk  (11/03/2022)   Overall Financial Resource Strain (CARDIA)    Difficulty of Paying Living Expenses: Not very hard  Food Insecurity: No Food Insecurity (11/03/2022)   Hunger Vital Sign    Worried About Running Out of Food in the Last Year: Never true    Ran Out of Food in the Last Year: Never true  Transportation Needs: No Transportation Needs (11/03/2022)   PRAPARE - Administrator, Civil Service (Medical): No    Lack of Transportation  (Non-Medical): No  Physical Activity: Not on file  Stress: No Stress Concern Present (11/03/2022)   [employer] of Occupational Health - Occupational Stress Questionnaire    Feeling of Stress : Only a little  Social Connections: Not on file    Review of Systems: A 12 point ROS discussed and pertinent positives are indicated in the HPI above.  All other systems are negative.  Review of Systems  Constitutional:  Positive for fatigue.  HENT:  Positive for trouble swallowing.   Respiratory:  Positive for shortness of breath. Negative for cough.   Cardiovascular:  Negative for chest pain and leg swelling.  Gastrointestinal:  Positive for nausea. Negative for abdominal pain, diarrhea and vomiting.  Neurological:  Positive for dizziness. Negative for headaches.    Vital Signs: BP 125/61   Pulse (!) 56   Temp 97.7 F (36.5 C) (Oral)  Resp 15   Ht 5\' 8"  (1.727 m)   Wt 130 lb (59 kg)   SpO2 95%   BMI 19.77 kg/m   Physical Exam Constitutional:      General: He is not in acute distress.    Appearance: He is underweight.  HENT:     Mouth/Throat:     Mouth: Mucous membranes are moist.     Pharynx: Oropharynx is clear.  Cardiovascular:     Rate and Rhythm: Regular rhythm. Bradycardia present.  Pulmonary:     Effort: Pulmonary effort is normal.     Breath sounds: Normal breath sounds.  Abdominal:     General: Bowel sounds are normal.     Palpations: Abdomen is soft.     Tenderness: There is no abdominal tenderness.  Musculoskeletal:     Right lower leg: No edema.     Left lower leg: No edema.  Skin:    General: Skin is warm and dry.  Neurological:     Mental Status: He is alert and oriented to person, place, and time.     Imaging: NM PET Image Initial (PI) Skull Base To Thigh  Result Date: 11/13/2022 CLINICAL DATA:  Initial treatment strategy for esophageal carcinoma. EXAM: NUCLEAR MEDICINE PET SKULL BASE TO THIGH TECHNIQUE: 7.65 mCi F-18 FDG was injected  intravenously. Full-ring PET imaging was performed from the skull base to thigh after the radiotracer. CT data was obtained and used for attenuation correction and anatomic localization. Fasting blood glucose: 93 mg/dl COMPARISON:  CT 16/02/9603 FINDINGS: Mediastinal blood pool activity: SUV max 1.39 Liver activity: SUV max NA NECK: No hypermetabolic lymph nodes in the neck. Incidental CT findings: None. CHEST: Tracer avid tumor within the distal esophagus has an SUV max 9.97 with corresponding mild circumferential wall thickening on the CT images. No tracer avid mediastinal or hilar lymph nodes. Tiny nodule within the medial left lower lobe is too small to characterize by PET-CT measuring 4 mm with SUV max 1.26, image 59/4. Incidental CT findings: Signs of chronic postinflammatory lung disease identified with bilateral peripheral interstitial reticulation. Aortic atherosclerosis with coronary artery calcifications. ABDOMEN/PELVIS: Extensive tracer avid liver metastases are identified throughout both lobes of the liver. Lesions are too numerous to count. Reference lesions include: -within segment 7/8 right hepatic lobe lesion measures 8.8 x 7.1 cm within SUV max of 30.72, image 72/4. -within segment 3 lesion measures 3.6 x 3.0 cm with SUV max of 25.08, image 88/4. -within segment 6 lesion measures 5.4 x 4.2 cm with SUV max 30.95, image 100/4. Tracer avid tumor extending from the distal esophagus into the cardia has an SUV max 14.45, image 75/4. No tracer avid abdominopelvic lymph nodes. Incidental CT findings: Aortic atherosclerosis. Small gallstones identified. SKELETON: No focal hypermetabolic activity to suggest skeletal metastasis. Incidental CT findings: There is asymmetric increased uptake localizing to the right first rib costosternal junction with SUV max 5.78, image 48/4. Asymmetric uptake localizing to the left [facility] joint is also noted. Status post bilateral hip arthroplasty. IMPRESSION: 1. Tracer avid tumor  is identified within the distal esophagus extending into the cardia. 2. Extensive tracer avid liver metastases. 3. Tiny nodule within the medial left lower lobe is too small to characterize by PET-CT measuring 4 mm. Attention to this nodule on future surveillance imaging is advised. 4. Asymmetric uptake localizing to the right first rib costosternal junction and left AC joint is noted. Findings are favored to represent arthropathic change 5. Gallstones. 6.  Aortic Atherosclerosis (ICD10-I70.0). Electronically Signed  By: Signa Kell M.D.   On: 11/13/2022 10:20   DG SWALLOW FUNC OP MEDICARE SPEECH PATH  Result Date: 11/09/2022 CLINICAL DATA:  Provided history: Cough, unspecified type. Dysphagia, unspecified type. EXAM: MODIFIED BARIUM SWALLOW TECHNIQUE: Different consistencies of barium were administered orally to the patient by the speech pathologist. Imaging of the pharynx was performed in the lateral projection. Mina Marble, PA-C (supervised by Dr. Jackey Loge) was present in the fluoroscopy room for the study, and operated the fluoroscopy equipment. FLUOROSCOPY: Radiation Exposure Index (as provided by the fluoroscopic device): 6.70 mGy Kerma COMPARISON:  None. FINDINGS: Different consistencies of barium were administered orally to the patient by the speech pathologist with fluoroscopic imaging of the pharynx from a lateral projection. Mina Marble, PA-C (supervised by Dr. Jackey Loge) was present in the fluoroscopy room and operated the fluoroscopy equipment. The speech pathologist observed aspiration with thin and nectar consistencies. Please refer to the speech pathologist's report for full details. Bridging ventral cervical osteophyte noted at C4-C5. IMPRESSION: 1. Modified barium swallow as described. 2. Aspiration was observed with thin and nectar consistencies. Please refer to the speech pathologist's report for complete details and recommendations. Electronically Signed   By: Jackey Loge D.O.    On: 11/09/2022 14:06    Labs:  CBC: Recent Labs    11/03/22 1255  WBC 8.7  HGB 11.9*  HCT 36.6*  PLT 298    COAGS: No results for input(s): "INR", "APTT" in the last 8760 hours.  BMP: Recent Labs    11/03/22 1255  NA 138  K 4.2  CL 98  CO2 30  GLUCOSE 158*  BUN 22  CALCIUM 9.0  CREATININE 0.61  GFRNONAA >60    LIVER FUNCTION TESTS: Recent Labs    11/03/22 1255  BILITOT 0.3  AST 59*  ALT 24  ALKPHOS 167*  PROT 6.9  ALBUMIN 3.5    TUMOR MARKERS: No results for input(s): "AFPTM", "CEA", "CA199", "CHROMGRNA" in the last 8760 hours.  Assessment and Plan:  Esophageal cancer with metastases; pending chemotherapy: [facility], 77 year old male, presents today to the [facility] Interventional Radiology department for an image-guided port-a-catheter placement.  Risks and benefits of image-guided port-a-catheter placement were discussed with the patient including, but not limited to bleeding, infection, pneumothorax, or fibrin sheath development and need for additional procedures.  All of the patient's questions were answered, patient is agreeable to proceed. He has been NPO. He is a full code.   Consent signed and in chart.  Thank you for this interesting consult.  I greatly enjoyed meeting [facility] and look forward to participating in their care.  A copy of this report was sent to the requesting provider on this date.  Electronically Signed: Alwyn Ren, AGACNP-BC [phone] 11/13/2022, 12:02 PM   I spent a total of  30 Minutes   in face to face in clinical consultation, greater than 50% of which was counseling/coordinating care for port-a-catheter placement.

## 2022-11-13 NOTE — Progress Notes (Signed)
Patient clinically stable post right IR Port placement per Dr. Archer Asa, tolerated well. Vitals stable pre and post procedure. Received Versed 1 mg along with Fentanyl 75 mcg IV for procedure. Report given to [facility] RN post procedure/specials/14, update given to wife at bedside post procedure.

## 2022-11-14 ENCOUNTER — Inpatient Hospital Stay (HOSPITAL_BASED_OUTPATIENT_CLINIC_OR_DEPARTMENT_OTHER): Payer: Medicare Other | Admitting: Hospice and Palliative Medicine

## 2022-11-14 ENCOUNTER — Inpatient Hospital Stay: Payer: Medicare Other

## 2022-11-14 ENCOUNTER — Telehealth: Payer: Self-pay

## 2022-11-14 DIAGNOSIS — M19011 Primary osteoarthritis, right shoulder: Secondary | ICD-10-CM

## 2022-11-14 DIAGNOSIS — Z515 Encounter for palliative care: Secondary | ICD-10-CM

## 2022-11-14 DIAGNOSIS — G893 Neoplasm related pain (acute) (chronic): Secondary | ICD-10-CM | POA: Diagnosis not present

## 2022-11-14 DIAGNOSIS — Z79891 Long term (current) use of opiate analgesic: Secondary | ICD-10-CM | POA: Diagnosis not present

## 2022-11-14 DIAGNOSIS — Z79899 Other long term (current) drug therapy: Secondary | ICD-10-CM

## 2022-11-14 DIAGNOSIS — C159 Malignant neoplasm of esophagus, unspecified: Secondary | ICD-10-CM

## 2022-11-14 DIAGNOSIS — S22080S Wedge compression fracture of T11-T12 vertebra, sequela: Secondary | ICD-10-CM

## 2022-11-14 DIAGNOSIS — G894 Chronic pain syndrome: Secondary | ICD-10-CM

## 2022-11-14 DIAGNOSIS — F1190 Opioid use, unspecified, uncomplicated: Secondary | ICD-10-CM

## 2022-11-14 MED FILL — Dexamethasone Sodium Phosphate Inj 100 MG/10ML: INTRAMUSCULAR | Qty: 1 | Status: AC

## 2022-11-14 NOTE — Telephone Encounter (Signed)
Spoke with patient's wife and I let her know if the patients swallowing worsens to speak with Dr.Yu regarding G-tube placement. Patient's wife stated he was doing well right now.

## 2022-11-14 NOTE — Telephone Encounter (Signed)
Left message for patient to call office.  

## 2022-11-15 ENCOUNTER — Inpatient Hospital Stay: Payer: Medicare Other

## 2022-11-15 ENCOUNTER — Inpatient Hospital Stay: Payer: Medicare Other | Admitting: Oncology

## 2022-11-15 ENCOUNTER — Encounter: Payer: Self-pay | Admitting: Oncology

## 2022-11-15 ENCOUNTER — Telehealth: Payer: Self-pay | Admitting: *Deleted

## 2022-11-15 VITALS — BP 103/51 | HR 71 | Temp 97.3°F | Resp 18 | Ht 68.0 in | Wt 137.4 lb

## 2022-11-15 DIAGNOSIS — C159 Malignant neoplasm of esophagus, unspecified: Secondary | ICD-10-CM | POA: Diagnosis not present

## 2022-11-15 DIAGNOSIS — I4892 Unspecified atrial flutter: Secondary | ICD-10-CM

## 2022-11-15 DIAGNOSIS — G893 Neoplasm related pain (acute) (chronic): Secondary | ICD-10-CM | POA: Diagnosis not present

## 2022-11-15 DIAGNOSIS — I4891 Unspecified atrial fibrillation: Secondary | ICD-10-CM | POA: Diagnosis not present

## 2022-11-15 DIAGNOSIS — E46 Unspecified protein-calorie malnutrition: Secondary | ICD-10-CM

## 2022-11-15 DIAGNOSIS — Z5111 Encounter for antineoplastic chemotherapy: Secondary | ICD-10-CM

## 2022-11-15 LAB — CBC WITH DIFFERENTIAL (CANCER CENTER ONLY)
Abs Immature Granulocytes: 0.03 10*3/uL (ref 0.00–0.07)
Basophils Absolute: 0.1 10*3/uL (ref 0.0–0.1)
Basophils Relative: 1 %
Eosinophils Absolute: 0.2 10*3/uL (ref 0.0–0.5)
Eosinophils Relative: 2 %
HCT: 35.3 % — ABNORMAL LOW (ref 39.0–52.0)
Hemoglobin: 11.9 g/dL — ABNORMAL LOW (ref 13.0–17.0)
Immature Granulocytes: 0 %
Lymphocytes Relative: 20 %
Lymphs Abs: 1.7 10*3/uL (ref 0.7–4.0)
MCH: 32.3 pg (ref 26.0–34.0)
MCHC: 33.7 g/dL (ref 30.0–36.0)
MCV: 95.9 fL (ref 80.0–100.0)
Monocytes Absolute: 0.8 10*3/uL (ref 0.1–1.0)
Monocytes Relative: 10 %
Neutro Abs: 5.6 10*3/uL (ref 1.7–7.7)
Neutrophils Relative %: 67 %
Platelet Count: 289 10*3/uL (ref 150–400)
RBC: 3.68 MIL/uL — ABNORMAL LOW (ref 4.22–5.81)
RDW: 14.5 % (ref 11.5–15.5)
WBC Count: 8.4 10*3/uL (ref 4.0–10.5)
nRBC: 0 % (ref 0.0–0.2)

## 2022-11-15 LAB — CMP (CANCER CENTER ONLY)
ALT: 28 U/L (ref 0–44)
AST: 71 U/L — ABNORMAL HIGH (ref 15–41)
Albumin: 3.3 g/dL — ABNORMAL LOW (ref 3.5–5.0)
Alkaline Phosphatase: 177 U/L — ABNORMAL HIGH (ref 38–126)
Anion gap: 10 (ref 5–15)
BUN: 22 mg/dL (ref 8–23)
CO2: 29 mmol/L (ref 22–32)
Calcium: 9.1 mg/dL (ref 8.9–10.3)
Chloride: 98 mmol/L (ref 98–111)
Creatinine: 0.61 mg/dL (ref 0.61–1.24)
GFR, Estimated: >60 mL/min (ref >60)
Glucose, Bld: 120 mg/dL — ABNORMAL HIGH (ref 70–99)
Potassium: 4.2 mmol/L (ref 3.5–5.1)
Sodium: 137 mmol/L (ref 135–145)
Total Bilirubin: 0.7 mg/dL (ref 0.3–1.2)
Total Protein: 7 g/dL (ref 6.5–8.1)

## 2022-11-15 MED ORDER — DEXTROSE 5 % IV SOLN
Freq: Once | INTRAVENOUS | Status: AC
  Filled 2022-11-15: qty 250

## 2022-11-15 MED ORDER — NALOXONE HCL 4 MG/0.1ML NA LIQD
1.0000 | Freq: Once | NASAL | 0 refills | Status: AC
Start: 2022-11-15 — End: 2023-03-09

## 2022-11-15 MED ORDER — SODIUM CHLORIDE 0.9 % IV SOLN
2400.0000 mg/m2 | INTRAVENOUS | Status: DC
  Administered 2022-11-15: 4100 mg via INTRAVENOUS
  Filled 2022-11-15: qty 82

## 2022-11-15 MED ORDER — SODIUM CHLORIDE 0.9 % IV SOLN
10.0000 mg | Freq: Once | INTRAVENOUS | Status: AC
  Administered 2022-11-15: 10 mg via INTRAVENOUS
  Filled 2022-11-15: qty 10

## 2022-11-15 MED ORDER — LEUCOVORIN CALCIUM INJECTION 350 MG
409.0000 mg/m2 | Freq: Once | INTRAVENOUS | Status: AC
  Administered 2022-11-15: 700 mg via INTRAVENOUS
  Filled 2022-11-15: qty 35

## 2022-11-15 MED ORDER — OXYCODONE-ACETAMINOPHEN 10-325 MG PO TABS
1.0000 | ORAL_TABLET | ORAL | 0 refills | Status: DC | PRN
Start: 2022-11-15 — End: 2022-11-15

## 2022-11-15 MED ORDER — FLUOROURACIL CHEMO INJECTION 2.5 GM/50ML
400.0000 mg/m2 | Freq: Once | INTRAVENOUS | Status: AC
  Administered 2022-11-15: 700 mg via INTRAVENOUS
  Filled 2022-11-15: qty 14

## 2022-11-15 MED ORDER — OXYCODONE HCL 10 MG PO TABS: 10.0000 mg | ORAL_TABLET | ORAL | 0 refills | Status: DC | PRN

## 2022-11-15 MED ORDER — PALONOSETRON HCL INJECTION 0.25 MG/5ML
0.2500 mg | Freq: Once | INTRAVENOUS | Status: AC
  Administered 2022-11-15: 0.25 mg via INTRAVENOUS
  Filled 2022-11-15: qty 5

## 2022-11-15 MED ORDER — OXALIPLATIN CHEMO INJECTION 100 MG/20ML
70.0000 mg/m2 | Freq: Once | INTRAVENOUS | Status: AC
  Administered 2022-11-15: 120 mg via INTRAVENOUS
  Filled 2022-11-15: qty 4

## 2022-11-15 NOTE — Progress Notes (Addendum)
Virtual Visit via Telephone Note  I connected with [facility] on 11/15/22 at  1:20 PM EDT by telephone and verified that I am speaking with the correct person using two identifiers.  Location: Patient: Home Provider: Clinic   I discussed the limitations, risks, security and privacy concerns of performing an evaluation and management service by telephone and the availability of in person appointments. I also discussed with the patient that there may be a patient responsible charge related to this service. The patient expressed understanding and agreed to proceed.   History of Present Illness:  Aaron [facility] is a 77 y.o. male with multiple medical problems including chronic pain followed by pain clinic, recently diagnosed with stage IV esophageal adenocarcinoma.  Palliative care was consulted to address goals and manage ongoing symptoms.   Observations/Objective: Spoke with patient and wife by phone.  Patient endorses worse pain overall since he was diagnosed with head and neck cancer.  He has been taking his Percocet 2 to 3 tablets every 6 hours with some improvement.  However, he is now out of pain medication early.  Of note, patient has been followed for years by the pain clinic.  Discussed with Dr. Cherylann Ratel and cancer center will take over prescribing.  Patient is on Soma and discussed with patient and wife regarding risk of overdose with opioid escalation.  Patient has been on stable dosing of Soma for years prescribed by his PCP without issues.  Assessment and Plan: Neoplasm related pain -rotate to oxycodone IR to alleviate acetaminophen dosing given liver mets.  Will increase oxycodone to 10 to 20 mg every 4 hours as needed for breakthrough pain.  Will consider starting long-acting opioid.  Refill naloxone  Follow Up Instructions: Follow-up telephone visit 1 month   I discussed the assessment and treatment plan with the patient. The patient was provided an opportunity to  ask questions and all were answered. The patient agreed with the plan and demonstrated an understanding of the instructions.   The patient was advised to call back or seek an in-person evaluation if the symptoms worsen or if the condition fails to improve as anticipated.  I provided 20 minutes of non-face-to-face time during this encounter.   Malachy Moan, NP

## 2022-11-15 NOTE — Assessment & Plan Note (Signed)
On Xarelto for anticoagulation.

## 2022-11-15 NOTE — Addendum Note (Signed)
Addended by: Laurette Schimke R on: 11/15/2022 01:04 PM   Modules accepted: Orders

## 2022-11-15 NOTE — Assessment & Plan Note (Addendum)
Follow up with nutritionist.  

## 2022-11-15 NOTE — Progress Notes (Signed)
Hematology/Oncology Consult Note [phone] [phone]     REFERRING PROVIDER: Rickard Patience, MD    CHIEF COMPLAINTS/PURPOSE OF CONSULTATION:  Esophageal cancer.   ASSESSMENT & PLAN:   Cancer Staging  Esophageal adenocarcinoma [facility]) Staging form: Esophagus - Adenocarcinoma, AJCC 8th Edition - Clinical stage from 11/03/2022: Stage IVB (cTX, cNX, cM1) - Signed by Rickard Patience, MD on 11/03/2022  Malignant neoplasm of urinary bladder [facility]) Staging form: Urinary Bladder, AJCC 7th Edition - Clinical: Stage 0a (Ta, N0, M0) - Signed by Rickard Patience, MD on 11/03/2022   Esophageal adenocarcinoma [facility]) Image findings and pathology results are reviewed with patient and wife.  PET scan results were reviewed and discussed with patient.  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  Recommend systemic chemotherapy FOLFOX.  NGS results come back today positive for HER2 copy again. Plan to add trastuzumab to her next visit.  Check echocardiogram.  Atrial fibrillation and flutter (HCC) On Xarelto for anticoagulation.    Protein calorie malnutrition (HCC) Follow-up with nutritionist  Neoplasm related pain Discussed with pain clinic.  Patient has increased pain due to new cancer diagnosis. Our clinic will take over pain management.  Continue follow-up with palliative care service. Discussed with palliative care service, plan to switch to oxycodone in the future to avoid potential liver toxicity from high-dose of acetaminophen.   Orders Placed This Encounter  Procedures   CBC with Differential (Cancer Center Only)    Standing Status:   Future    Standing Expiration Date:   11/29/2023   CMP (Cancer Center only)    Standing Status:   Future    Standing Expiration Date:   11/29/2023   CEA    Standing Status:   Future    Standing Expiration Date:   11/29/2023   CEA    Standing Status:   Future    Standing Expiration Date:   12/13/2023   CBC with  Differential (Cancer Center Only)    Standing Status:   Future    Standing Expiration Date:   12/13/2023   CMP (Cancer Center only)    Standing Status:   Future    Standing Expiration Date:   12/13/2023   CEA    Standing Status:   Future    Standing Expiration Date:   12/27/2023   CBC with Differential (Cancer Center Only)    Standing Status:   Future    Standing Expiration Date:   12/27/2023   CMP (Cancer Center only)    Standing Status:   Future    Standing Expiration Date:   12/27/2023   CBC with Differential (Cancer Center Only)    Standing Status:   Future    Standing Expiration Date:   11/15/2023   CMP (Cancer Center only)    Standing Status:   Future    Standing Expiration Date:   11/15/2023   Follow-up in 1 week for evaluation of treatment tolerance Evaluation.  Follow-up in 2 weeks for lab MD FOLFOX and trastuzumab. All questions were answered. The patient knows to call the clinic with any problems, questions or concerns.  Rickard Patience, MD, PhD [facility] Hematology Oncology 11/15/2022    HISTORY OF PRESENTING ILLNESS:  Garrod Scalzitti 77 y.o. male presents to establish care for esophageal adenocarcinoma.  I have reviewed his chart and materials related to his cancer extensively and collaborated history with the patient. Summary of oncologic history is as follows: Oncology History  Malignant neoplasm of urinary bladder (HCC)  10/23/2013  Initial Diagnosis   Malignant neoplasm of urinary bladder (HCC)   11/03/2022 Cancer Staging   Staging form: Urinary Bladder, AJCC 7th Edition - Clinical: Stage 0a (Ta, N0, M0) - Signed by Rickard Patience, MD on 11/03/2022   Esophageal adenocarcinoma (HCC)  10/20/2022 Imaging   CT abdomen pelvis w contrast  -Multiple bilobar hepatic lesions extending throughout most of the liver parenchyma and concerning for metastatic disease. Correlation with tissue sampling could be considered for definitive pathologic diagnosis.   - Circumferential wall  thickening of the distal esophagus and gastroesophageal junction. Recommend further evaluation with endoscopy to exclude underlying malignancy. No other evidence of primary malignancy in the abdomen or pelvis although note is made that evaluation of the pelvic structures is very limited in this CT scan due to extensive artifact secondary to the patient's bilateral total hip arthroplasty.   - Cystic lesion in the pancreatic tail measuring up to 1.0 cm, possibly a sidebranch IPMN. No obvious pancreatic ductal dilatation. Further evaluation with MRI/MRCP could be considered.   - Extensive degenerative changes to the spine with compression deformities of T11, T12 and L2, similar to prior.    10/20/2022 Imaging   CT chest w contrast   New 4 mm nodule in the posterior segment of left lower lobe, 3 mm nodule in the left upper chest of pulmonary metastasis.   Stable mild centrilobular emphysema and mild peripheral reticulation which may reflect smoking-related lung fibrosis.   Mildly dilated esophagus with air-fluid level and circumferential thickening in the distal esophagus which may reflect chronic esophagitis. Consider endoscopic evaluation.    11/03/2022 Initial Diagnosis   Esophageal adenocarcinoma   + dysphagia for both liquid and solid food, cough after eating. Unintentional weight loss 50 pounds over the past years, 14 pounds within last year.   10/30/22 EGD showed A large, submucosal mass with no bleeding and stigmata of recent bleeding was found in the lower third of the esophagus, 35 to 40 cm from the incisors. The mass was partially obstructing and circumferential. Biopsies were taken with a cold forceps for histology.  Pathology showed moderately differentiated adenocarcinoma involving squamocolumnar junctional mucosa with focal intestinal metaplasia.   [facility] NGS showed ERBB2 copy number gain, TP53 stop gain, NSD1 frameshift, RARA copy number gain, TOP2A copy number gain. TMB 7.9 m/mb, MSI  stable.    11/03/2022 Cancer Staging   Staging form: Esophagus - Adenocarcinoma, AJCC 8th Edition - Clinical stage from 11/03/2022: Stage IVB (cTX, cNX, cM1) - Signed by Rickard Patience, MD on 11/03/2022 Stage prefix: Initial diagnosis   11/13/2022 Imaging   PET scan showed 1. Tracer avid tumor is identified within the distal esophagus extending into the cardia. 2. Extensive tracer avid liver metastases. 3. Tiny nodule within the medial left lower lobe is too small to characterize by PET-CT measuring 4 mm. Attention to this nodule on future surveillance imaging is advised. 4. Asymmetric uptake localizing to the right first rib costosternal junction and left AC joint is noted. Findings are favored to represent arthropathic change 5. Gallstones. 6.  Aortic Atherosclerosis   11/13/2022 Procedure   Mediport placement by IR   11/15/2022 -  Chemotherapy   Patient is on Treatment Plan : GASTROESOPHAGEAL FOLFOX q14d x 6 cycles      He is a current every day smoker. He denies abdominal pain, nausea vomiting. Gait is not steady, frequent falls.  Aifb on Xarelto. He currently is able to eat soft moist food.  + chronic neck and back pain, joint pain,  He has most recently been followed by [facility] with Dr. Cherylann Ratel.  Patient describes pain as being recently worse associated with several recent falls.  He also has described epigastric right upper quadrant pain he has been on Percocet 10-325mg  patient reports that he has been taking 2-3 tablets every 6 hours given the intensity of pain.  MEDICAL HISTORY:  Past Medical History:  Diagnosis Date   Arthralgia of lower leg 04/02/2012   Arthralgia of upper arm 06/18/2009   Cancer (HCC)    Diabetes mellitus without complication (HCC)    FH: atrial fibrillation    Hypertension     SURGICAL HISTORY: Past Surgical History:  Procedure Laterality Date   bladder cancer surgery  2015   ESOPHAGOGASTRODUODENOSCOPY (EGD) WITH PROPOFOL N/A 10/30/2022   Procedure:  ESOPHAGOGASTRODUODENOSCOPY (EGD) WITH PROPOFOL;  Surgeon: Toney Reil, MD;  Location: [facility] ENDOSCOPY;  Service: Gastroenterology;  Laterality: N/A;   IR IMAGING GUIDED PORT INSERTION  11/13/2022   JOINT REPLACEMENT     REPLACEMENT TOTAL HIP W/  RESURFACING IMPLANTS Bilateral    SHOULDER SURGERY Bilateral    TOTAL KNEE ARTHROPLASTY Bilateral     SOCIAL HISTORY: Social History   Socioeconomic History   Marital status: Married    Spouse name: Not on file   Number of children: Not on file   Years of education: Not on file   Highest education level: Not on file  Occupational History   Not on file  Tobacco Use   Smoking status: Every Day    Packs/day: .5    Types: Cigarettes   Smokeless tobacco: Never  Vaping Use   Vaping Use: Never used  Substance and Sexual Activity   Alcohol use: No    Alcohol/week: 0.0 standard drinks of alcohol   Drug use: No   Sexual activity: Not on file  Other Topics Concern   Not on file  Social History Narrative   Lives with wife, [name].    Social Determinants of Health   Financial Resource Strain: Low Risk  (11/03/2022)   Overall Financial Resource Strain (CARDIA)    Difficulty of Paying Living Expenses: Not very hard  Food Insecurity: No Food Insecurity (11/03/2022)   Hunger Vital Sign    Worried About Running Out of Food in the Last Year: Never true    Ran Out of Food in the Last Year: Never true  Transportation Needs: No Transportation Needs (11/03/2022)   PRAPARE - Administrator, Civil Service (Medical): No    Lack of Transportation (Non-Medical): No  Physical Activity: Not on file  Stress: No Stress Concern Present (11/03/2022)   [employer] of Occupational Health - Occupational Stress Questionnaire    Feeling of Stress : Only a little  Social Connections: Not on file  Intimate Partner Violence: Not At Risk (11/03/2022)   Humiliation, Afraid, Rape, and Kick questionnaire    Fear of Current or Ex-Partner: No     Emotionally Abused: No    Physically Abused: No    Sexually Abused: No    FAMILY HISTORY: Family History  Problem Relation Age of Onset   Dementia Mother    Heart disease Father     ALLERGIES:  is allergic to diltiazem, penicillins, and tizanidine.  MEDICATIONS:  Current Outpatient Medications  Medication Sig Dispense Refill   acetaminophen (TYLENOL) 500 MG tablet Take 500 mg by mouth every 8 (eight) hours as needed.     ALPRAZolam (XANAX) 0.5 MG tablet Take 1 tablet (0.5 mg  total) by mouth See admin instructions. Take 1 tablet 30 minutes prior to imaging procedure, may take another tablet if needed. 2 tablet 0   B Complex-C (VITAMIN B + C COMPLEX) TABS Take 1 capsule by mouth every morning.     carisoprodol (SOMA) 350 MG tablet Take 350 mg by mouth 2 (two) times daily.      Cetirizine HCl 10 MG CAPS Take 1 capsule by mouth every morning.     dexamethasone (DECADRON) 4 MG tablet Take 2 tablets (8 mg total) by mouth daily. Start the day after chemotherapy for 2 days. Take with food. 30 tablet 1   lidocaine-prilocaine (EMLA) cream Apply to affected area once 30 g 3   Magnesium 500 MG TABS Take 500 mg by mouth as needed.     melatonin (MELATONIN MAXIMUM STRENGTH) 5 MG TABS Take 5 mg by mouth at bedtime as needed.     omeprazole (PRILOSEC) 10 MG capsule Take 20 mg by mouth daily.     ondansetron (ZOFRAN) 8 MG tablet Take by mouth.     ondansetron (ZOFRAN) 8 MG tablet Take 1 tablet (8 mg total) by mouth every 8 (eight) hours as needed for nausea or vomiting. Start on the third day after chemotherapy. 30 tablet 1   prazosin (MINIPRESS) 1 MG capsule Take 1 mg by mouth at bedtime.     prochlorperazine (COMPAZINE) 10 MG tablet Take 1 tablet (10 mg total) by mouth every 6 (six) hours as needed for nausea or vomiting. 30 tablet 1   rivaroxaban (XARELTO) 20 MG TABS tablet Take 20 mg by mouth.     rosuvastatin (CRESTOR) 20 MG tablet Take 20 mg by mouth daily.     sertraline (ZOLOFT) 50 MG tablet  Take by mouth.     naloxone (NARCAN) nasal spray 4 mg/0.1 mL Place 1 spray into the nose once for 1 dose. Spray half of bottle content into each nostril, then call 911 2 each 0   oxyCODONE-acetaminophen (PERCOCET) 10-325 MG tablet Take 1-2 tablets by mouth every 4 (four) hours as needed for pain. 120 tablet 0   No current facility-administered medications for this visit.   Facility-Administered Medications Ordered in Other Visits  Medication Dose Route Frequency Provider Last Rate Last Admin   fluorouracil (ADRUCIL) 4,100 mg in sodium chloride 0.9 % 68 mL chemo infusion  2,400 mg/m2 (Treatment Plan Recorded) Intravenous 1 day or 1 dose Rickard Patience, MD   Infusion Verify at 11/15/22 1257    Review of Systems  Constitutional:  Positive for appetite change, fatigue and unexpected weight change. Negative for chills and fever.  HENT:   Negative for hearing loss and voice change.   Eyes:  Negative for eye problems and icterus.  Respiratory:  Negative for chest tightness, cough and shortness of breath.   Cardiovascular:  Negative for chest pain and leg swelling.  Gastrointestinal:  Negative for abdominal distention and abdominal pain.       Heart burn  Endocrine: Negative for hot flashes.  Genitourinary:  Negative for difficulty urinating, dysuria and frequency.   Musculoskeletal:  Positive for arthralgias, back pain, gait problem and neck pain.  Skin:  Negative for itching and rash.  Neurological:  Positive for gait problem. Negative for light-headedness and numbness.       Falls.   Hematological:  Negative for adenopathy. Does not bruise/bleed easily.  Psychiatric/Behavioral:  Negative for confusion.      PHYSICAL EXAMINATION: ECOG PERFORMANCE STATUS: 1 - Symptomatic but completely ambulatory  Vitals:   11/15/22 0822  BP: (!) 103/51  Pulse: 71  Resp: 18  Temp: (!) 97.3 F (36.3 C)  SpO2: 97%   Filed Weights   11/15/22 0822  Weight: 137 lb 6.4 oz (62.3 kg)    Physical  Exam Constitutional:      General: He is not in acute distress.    Appearance: He is ill-appearing. He is not diaphoretic.  HENT:     Head: Normocephalic and atraumatic.  Eyes:     General: No scleral icterus.    Pupils: Pupils are equal, round, and reactive to light.  Cardiovascular:     Rate and Rhythm: Normal rate and regular rhythm.     Heart sounds: No murmur heard. Pulmonary:     Effort: Pulmonary effort is normal. No respiratory distress.     Breath sounds: Normal breath sounds. No wheezing.  Abdominal:     General: There is no distension.     Palpations: Abdomen is soft.     Tenderness: There is no abdominal tenderness.  Musculoskeletal:        General: Normal range of motion.     Cervical back: Normal range of motion and neck supple.  Skin:    General: Skin is warm and dry.     Findings: No erythema.  Neurological:     Mental Status: He is alert and oriented to person, place, and time. Mental status is at baseline.     Cranial Nerves: No cranial nerve deficit.     Motor: No abnormal muscle tone.  Psychiatric:        Mood and Affect: Mood and affect normal.      LABORATORY DATA:  I have reviewed the data as listed    Latest Ref Rng & Units 11/15/2022    7:58 AM 11/03/2022   12:55 PM  CBC  WBC 4.0 - 10.5 K/uL 8.4  8.7   Hemoglobin 13.0 - 17.0 g/dL 16.1  09.6   Hematocrit 39.0 - 52.0 % 35.3  36.6   Platelets 150 - 400 K/uL 289  298       Latest Ref Rng & Units 11/15/2022    7:58 AM 11/03/2022   12:55 PM 02/10/2019    2:08 PM  CMP  Glucose 70 - 99 mg/dL 045  409  811   BUN 8 - 23 mg/dL 22  22  11    Creatinine 0.61 - 1.24 mg/dL 9.14  7.82  9.56   Sodium 135 - 145 mmol/L 137  138  141   Potassium 3.5 - 5.1 mmol/L 4.2  4.2  4.9   Chloride 98 - 111 mmol/L 98  98  102   CO2 22 - 32 mmol/L 29  30    Calcium 8.9 - 10.3 mg/dL 9.1  9.0  9.4   Total Protein 6.5 - 8.1 g/dL 7.0  6.9  6.4   Total Bilirubin 0.3 - 1.2 mg/dL 0.7  0.3  0.3   Alkaline Phos 38 - 126 U/L 177   167  89   AST 15 - 41 U/L 71  59  15   ALT 0 - 44 U/L 28  24       RADIOGRAPHIC STUDIES: I have personally reviewed the radiological images as listed and agreed with the findings in the report. IR IMAGING GUIDED PORT INSERTION  Result Date: 11/13/2022 INDICATION: Esophageal adenocarcinoma. Patient presents for port catheter placement. EXAM: IMPLANTED PORT A CATH PLACEMENT WITH ULTRASOUND AND FLUOROSCOPIC GUIDANCE MEDICATIONS: None. ANESTHESIA/SEDATION:  Versed 1 mg IV; Fentanyl 75 mcg IV; Moderate Sedation Time:  14 minutes The patient's vital signs and level of consciousness were continuously monitored during the procedure by the interventional radiology nurse under my direct supervision. FLUOROSCOPY: Radiation exposure index: 1 mGy reference air kerma COMPLICATIONS: None immediate. PROCEDURE: The right neck and chest was prepped with chlorhexidine, and draped in the usual sterile fashion using maximum barrier technique (cap and mask, sterile gown, sterile gloves, large sterile sheet, hand hygiene and cutaneous antiseptic). Local anesthesia was attained by infiltration with 1% lidocaine with epinephrine. Ultrasound demonstrated patency of the right internal jugular vein, and this was documented with an image. Under real-time ultrasound guidance, this vein was accessed with a 21 gauge micropuncture needle and image documentation was performed. A small dermatotomy was made at the access site with an 11 scalpel. A 0.018" wire was advanced into the SVC and the access needle exchanged for a 28F micropuncture vascular sheath. The 0.018" wire was then removed and a 0.035" wire advanced into the IVC. An appropriate location for the subcutaneous reservoir was selected below the clavicle and an incision was made through the skin and underlying soft tissues. The subcutaneous tissues were then dissected using a combination of blunt and sharp surgical technique and a pocket was formed. A Bard ISP single lumen power  injectable portacatheter was then tunneled through the subcutaneous tissues from the pocket to the dermatotomy and the port reservoir placed within the subcutaneous pocket. The venous access site was then serially dilated and a peel away vascular sheath placed over the wire. The wire was removed and the port catheter advanced into position under fluoroscopic guidance. The catheter tip is positioned in the superior cavoatrial junction. This was documented with a spot image. The portacatheter was then tested and found to flush and aspirate well. The port was flushed with saline followed by 100 units/mL heparinized saline. The pocket was then closed in two layers using first subdermal inverted interrupted absorbable sutures followed by a running subcuticular suture. The epidermis was then sealed with Dermabond. The dermatotomy at the venous access site was also closed with Dermabond. IMPRESSION: Successful placement of a right IJ approach Power Port with ultrasound and fluoroscopic guidance. The catheter is ready for use. Electronically Signed   By: Malachy Moan M.D.   On: 11/13/2022 15:27   NM PET Image Initial (PI) Skull Base To Thigh  Result Date: 11/13/2022 CLINICAL DATA:  Initial treatment strategy for esophageal carcinoma. EXAM: NUCLEAR MEDICINE PET SKULL BASE TO THIGH TECHNIQUE: 7.65 mCi F-18 FDG was injected intravenously. Full-ring PET imaging was performed from the skull base to thigh after the radiotracer. CT data was obtained and used for attenuation correction and anatomic localization. Fasting blood glucose: 93 mg/dl COMPARISON:  CT 78/29/5621 FINDINGS: Mediastinal blood pool activity: SUV max 1.39 Liver activity: SUV max NA NECK: No hypermetabolic lymph nodes in the neck. Incidental CT findings: None. CHEST: Tracer avid tumor within the distal esophagus has an SUV max 9.97 with corresponding mild circumferential wall thickening on the CT images. No tracer avid mediastinal or hilar lymph nodes.  Tiny nodule within the medial left lower lobe is too small to characterize by PET-CT measuring 4 mm with SUV max 1.26, image 59/4. Incidental CT findings: Signs of chronic postinflammatory lung disease identified with bilateral peripheral interstitial reticulation. Aortic atherosclerosis with coronary artery calcifications. ABDOMEN/PELVIS: Extensive tracer avid liver metastases are identified throughout both lobes of the liver. Lesions are too numerous to count. Reference lesions include: -  within segment 7/8 right hepatic lobe lesion measures 8.8 x 7.1 cm within SUV max of 30.72, image 72/4. -within segment 3 lesion measures 3.6 x 3.0 cm with SUV max of 25.08, image 88/4. -within segment 6 lesion measures 5.4 x 4.2 cm with SUV max 30.95, image 100/4. Tracer avid tumor extending from the distal esophagus into the cardia has an SUV max 14.45, image 75/4. No tracer avid abdominopelvic lymph nodes. Incidental CT findings: Aortic atherosclerosis. Small gallstones identified. SKELETON: No focal hypermetabolic activity to suggest skeletal metastasis. Incidental CT findings: There is asymmetric increased uptake localizing to the right first rib costosternal junction with SUV max 5.78, image 48/4. Asymmetric uptake localizing to the left [facility] joint is also noted. Status post bilateral hip arthroplasty. IMPRESSION: 1. Tracer avid tumor is identified within the distal esophagus extending into the cardia. 2. Extensive tracer avid liver metastases. 3. Tiny nodule within the medial left lower lobe is too small to characterize by PET-CT measuring 4 mm. Attention to this nodule on future surveillance imaging is advised. 4. Asymmetric uptake localizing to the right first rib costosternal junction and left AC joint is noted. Findings are favored to represent arthropathic change 5. Gallstones. 6.  Aortic Atherosclerosis (ICD10-I70.0). Electronically Signed   By: Signa Kell M.D.   On: 11/13/2022 10:20   DG SWALLOW FUNC OP MEDICARE  SPEECH PATH  Result Date: 11/09/2022 CLINICAL DATA:  Provided history: Cough, unspecified type. Dysphagia, unspecified type. EXAM: MODIFIED BARIUM SWALLOW TECHNIQUE: Different consistencies of barium were administered orally to the patient by the speech pathologist. Imaging of the pharynx was performed in the lateral projection. Mina Marble, PA-C (supervised by Dr. Jackey Loge) was present in the fluoroscopy room for the study, and operated the fluoroscopy equipment. FLUOROSCOPY: Radiation Exposure Index (as provided by the fluoroscopic device): 6.70 mGy Kerma COMPARISON:  None. FINDINGS: Different consistencies of barium were administered orally to the patient by the speech pathologist with fluoroscopic imaging of the pharynx from a lateral projection. Mina Marble, PA-C (supervised by Dr. Jackey Loge) was present in the fluoroscopy room and operated the fluoroscopy equipment. The speech pathologist observed aspiration with thin and nectar consistencies. Please refer to the speech pathologist's report for full details. Bridging ventral cervical osteophyte noted at C4-C5. IMPRESSION: 1. Modified barium swallow as described. 2. Aspiration was observed with thin and nectar consistencies. Please refer to the speech pathologist's report for complete details and recommendations. Electronically Signed   By: Jackey Loge D.O.   On: 11/09/2022 14:06

## 2022-11-15 NOTE — Assessment & Plan Note (Addendum)
Discussed with pain clinic.  Patient has increased pain due to new cancer diagnosis. Our clinic will take over pain management.  Continue follow-up with palliative care service. Discussed with palliative care service, plan to switch to oxycodone in the future to avoid potential liver toxicity from high-dose of acetaminophen.

## 2022-11-15 NOTE — Telephone Encounter (Addendum)
Prior auth submitted to pt's insurance for oxycodone 10 mg- clinical key -[id number]  Pending insurance approval

## 2022-11-15 NOTE — Assessment & Plan Note (Signed)
Image findings and pathology results are reviewed with patient and wife.  PET scan results were reviewed and discussed with patient.  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  Recommend systemic chemotherapy FOLFOX.  NGS results come back today positive for HER2 copy again. Plan to add trastuzumab to her next visit.  Check echocardiogram.

## 2022-11-15 NOTE — Patient Instructions (Signed)
[facility] [facility]  Discharge Instructions: Thank you for choosing [facility] to provide your oncology and hematology care.  If you have a lab appointment with the Cancer Center, please go directly to the Cancer Center and check in at the registration area.  Wear comfortable clothing and clothing appropriate for easy access to any Portacath or PICC line.   We strive to give you quality time with your provider. You may need to reschedule your appointment if you arrive late (15 or more minutes).  Arriving late affects you and other patients whose appointments are after yours.  Also, if you miss three or more appointments without notifying the office, you may be dismissed from the clinic at the provider's discretion.      For prescription refill requests, have your pharmacy contact our office and allow 72 hours for refills to be completed.    Today you received the following chemotherapy and/or immunotherapy agents Oxaliplatin, Leucovorin, Fluorouracil (5FU), and pump.      To help prevent nausea and vomiting after your treatment, we encourage you to take your nausea medication as directed.  BELOW ARE SYMPTOMS THAT SHOULD BE REPORTED IMMEDIATELY: *FEVER GREATER THAN 100.4 F (38 C) OR HIGHER *CHILLS OR SWEATING *NAUSEA AND VOMITING THAT IS NOT CONTROLLED WITH YOUR NAUSEA MEDICATION *UNUSUAL SHORTNESS OF BREATH *UNUSUAL BRUISING OR BLEEDING *URINARY PROBLEMS (pain or burning when urinating, or frequent urination) *BOWEL PROBLEMS (unusual diarrhea, constipation, pain near the anus) TENDERNESS IN MOUTH AND THROAT WITH OR WITHOUT PRESENCE OF ULCERS (sore throat, sores in mouth, or a toothache) UNUSUAL RASH, SWELLING OR PAIN  UNUSUAL VAGINAL DISCHARGE OR ITCHING   Items with * indicate a potential emergency and should be followed up as soon as possible or go to the Emergency Department if any problems should occur.  Please show the CHEMOTHERAPY ALERT  CARD or IMMUNOTHERAPY ALERT CARD at check-in to the Emergency Department and triage nurse.  Should you have questions after your visit or need to cancel or reschedule your appointment, please contact [facility]  [phone] and follow the prompts.  Office hours are 8:00 a.m. to 4:30 p.m. Monday - Friday. Please note that voicemails left after 4:00 p.m. may not be returned until the following business day.  We are closed weekends and major holidays. You have access to a nurse at all times for urgent questions. Please call the main number to the clinic [phone] and follow the prompts.  For any non-urgent questions, you may also contact your provider using MyChart. We now offer e-Visits for anyone 43 and older to request care online for non-urgent symptoms. For details visit [email or web address].   Also download the MyChart app! Go to the app store, search "MyChart", open the app, select [place], and log in with your MyChart username and password.

## 2022-11-15 NOTE — Progress Notes (Signed)
DISCONTINUE ON PATHWAY REGIMEN - Gastroesophageal     A cycle is every 14 days:     Oxaliplatin      Leucovorin      Fluorouracil      Fluorouracil   **Always confirm dose/schedule in your pharmacy ordering system**  REASON: Other Reason PRIOR TREATMENT: GEOS3: [id number] q14 Days Until Progression or Unacceptable Toxicity TREATMENT RESPONSE: Unable to Evaluate  START ON PATHWAY REGIMEN - Gastroesophageal     Cycle 1: A cycle is 14 days:     Trastuzumab-xxxx      Oxaliplatin      Leucovorin      Fluorouracil      Fluorouracil    Cycles 2 and beyond: A cycle is every 14 days:     Trastuzumab-xxxx      Oxaliplatin      Leucovorin      Fluorouracil      Fluorouracil   **Always confirm dose/schedule in your pharmacy ordering system**  Patient Characteristics: Distant Metastases (cM1/pM1) / Locally Recurrent Disease, Adenocarcinoma - Esophageal, GE Junction, and Gastric, First Line, HER2 Positive, PD?L1 Expression  CPS < 1/ Negative/Unknown Therapeutic Status: Distant Metastases (No Additional Staging) Histology: Adenocarcinoma Disease Classification: Esophageal Line of Therapy: First Line HER2 Status: Positive PD-L1 Expression Status: Awaiting Test Results Intent of Therapy: Non-Curative / Palliative Intent, Discussed with Patient

## 2022-11-16 ENCOUNTER — Inpatient Hospital Stay: Payer: Medicare Other

## 2022-11-16 ENCOUNTER — Encounter: Payer: Self-pay | Admitting: Oncology

## 2022-11-16 ENCOUNTER — Ambulatory Visit
Admission: RE | Admit: 2022-11-16 | Discharge: 2022-11-16 | Disposition: A | Discharge status: Home / Self Care | Payer: Medicare Other | Source: Ambulatory Visit | Attending: Oncology | Admitting: Oncology

## 2022-11-16 ENCOUNTER — Telehealth: Payer: Self-pay | Admitting: *Deleted

## 2022-11-16 DIAGNOSIS — I4891 Unspecified atrial fibrillation: Secondary | ICD-10-CM | POA: Diagnosis not present

## 2022-11-16 DIAGNOSIS — C159 Malignant neoplasm of esophagus, unspecified: Secondary | ICD-10-CM | POA: Insufficient documentation

## 2022-11-16 DIAGNOSIS — I4892 Unspecified atrial flutter: Secondary | ICD-10-CM | POA: Insufficient documentation

## 2022-11-16 MED ORDER — TECHNETIUM TC 99M-LABELED RED BLOOD CELLS IV KIT
20.0000 | PACK | Freq: Once | INTRAVENOUS | Status: AC | PRN
  Administered 2022-11-16: 21.11 via INTRAVENOUS

## 2022-11-16 NOTE — Telephone Encounter (Signed)
Wife called asking what day patient is to start taking his Decadron today or Saturday as his pump comes off tomorrow. Please return her call

## 2022-11-16 NOTE — Telephone Encounter (Signed)
Aaron Short (Key: [id number]) - [id number] oxyCODONE HCl 10MG  tablets Status: PA Response - Approved

## 2022-11-16 NOTE — Telephone Encounter (Signed)
Left msg for [facility] that script was approved by insurance

## 2022-11-16 NOTE — Telephone Encounter (Signed)
Call returned to patient wife and informed to start decadron on Saturday

## 2022-11-17 ENCOUNTER — Inpatient Hospital Stay: Payer: Medicare Other

## 2022-11-17 ENCOUNTER — Encounter: Payer: Self-pay | Admitting: Oncology

## 2022-11-17 VITALS — BP 102/64 | HR 74 | Resp 18

## 2022-11-17 DIAGNOSIS — C159 Malignant neoplasm of esophagus, unspecified: Secondary | ICD-10-CM

## 2022-11-17 DIAGNOSIS — Z5111 Encounter for antineoplastic chemotherapy: Secondary | ICD-10-CM | POA: Diagnosis not present

## 2022-11-17 MED ORDER — SODIUM CHLORIDE 0.9% FLUSH
10.0000 mL | INTRAVENOUS | Status: DC | PRN
  Administered 2022-11-17: 10 mL
  Filled 2022-11-17: qty 10

## 2022-11-17 MED ORDER — HEPARIN SOD (PORK) LOCK FLUSH 100 UNIT/ML IV SOLN
500.0000 [IU] | Freq: Once | INTRAVENOUS | Status: AC | PRN
  Administered 2022-11-17: 500 [IU]
  Filled 2022-11-17: qty 5

## 2022-11-17 NOTE — Addendum Note (Signed)
Addended by: Rickard Patience on: 11/17/2022 09:53 PM   Modules accepted: Orders

## 2022-11-20 ENCOUNTER — Ambulatory Visit: Payer: Medicare Other

## 2022-11-21 ENCOUNTER — Other Ambulatory Visit: Payer: Self-pay | Admitting: Oncology

## 2022-11-22 ENCOUNTER — Inpatient Hospital Stay: Payer: Medicare Other

## 2022-11-22 ENCOUNTER — Encounter: Payer: Self-pay | Admitting: Oncology

## 2022-11-22 ENCOUNTER — Inpatient Hospital Stay (HOSPITAL_BASED_OUTPATIENT_CLINIC_OR_DEPARTMENT_OTHER): Payer: Medicare Other | Admitting: Oncology

## 2022-11-22 VITALS — BP 109/65 | HR 96 | Temp 97.1°F | Resp 18 | Wt 136.8 lb

## 2022-11-22 DIAGNOSIS — C159 Malignant neoplasm of esophagus, unspecified: Secondary | ICD-10-CM | POA: Diagnosis not present

## 2022-11-22 DIAGNOSIS — I4892 Unspecified atrial flutter: Secondary | ICD-10-CM

## 2022-11-22 DIAGNOSIS — Z5111 Encounter for antineoplastic chemotherapy: Secondary | ICD-10-CM | POA: Diagnosis not present

## 2022-11-22 DIAGNOSIS — T451X5A Adverse effect of antineoplastic and immunosuppressive drugs, initial encounter: Secondary | ICD-10-CM

## 2022-11-22 DIAGNOSIS — E46 Unspecified protein-calorie malnutrition: Secondary | ICD-10-CM

## 2022-11-22 DIAGNOSIS — K521 Toxic gastroenteritis and colitis: Secondary | ICD-10-CM

## 2022-11-22 DIAGNOSIS — I4891 Unspecified atrial fibrillation: Secondary | ICD-10-CM | POA: Diagnosis not present

## 2022-11-22 DIAGNOSIS — G893 Neoplasm related pain (acute) (chronic): Secondary | ICD-10-CM | POA: Diagnosis not present

## 2022-11-22 LAB — CMP (CANCER CENTER ONLY)
ALT: 29 U/L (ref 0–44)
AST: 68 U/L — ABNORMAL HIGH (ref 15–41)
Albumin: 3.4 g/dL — ABNORMAL LOW (ref 3.5–5.0)
Alkaline Phosphatase: 153 U/L — ABNORMAL HIGH (ref 38–126)
Anion gap: 10 (ref 5–15)
BUN: 22 mg/dL (ref 8–23)
CO2: 28 mmol/L (ref 22–32)
Calcium: 9 mg/dL (ref 8.9–10.3)
Chloride: 97 mmol/L — ABNORMAL LOW (ref 98–111)
Creatinine: 0.67 mg/dL (ref 0.61–1.24)
GFR, Estimated: >60 mL/min (ref >60)
Glucose, Bld: 149 mg/dL — ABNORMAL HIGH (ref 70–99)
Potassium: 4 mmol/L (ref 3.5–5.1)
Sodium: 135 mmol/L (ref 135–145)
Total Bilirubin: 0.5 mg/dL (ref 0.3–1.2)
Total Protein: 6.7 g/dL (ref 6.5–8.1)

## 2022-11-22 LAB — CBC WITH DIFFERENTIAL (CANCER CENTER ONLY)
Abs Immature Granulocytes: 0.02 10*3/uL (ref 0.00–0.07)
Basophils Absolute: 0.1 10*3/uL (ref 0.0–0.1)
Basophils Relative: 1 %
Eosinophils Absolute: 0.1 10*3/uL (ref 0.0–0.5)
Eosinophils Relative: 2 %
HCT: 35.7 % — ABNORMAL LOW (ref 39.0–52.0)
Hemoglobin: 12.3 g/dL — ABNORMAL LOW (ref 13.0–17.0)
Immature Granulocytes: 0 %
Lymphocytes Relative: 30 %
Lymphs Abs: 1.9 10*3/uL (ref 0.7–4.0)
MCH: 32.5 pg (ref 26.0–34.0)
MCHC: 34.5 g/dL (ref 30.0–36.0)
MCV: 94.4 fL (ref 80.0–100.0)
Monocytes Absolute: 0.4 10*3/uL (ref 0.1–1.0)
Monocytes Relative: 6 %
Neutro Abs: 3.9 10*3/uL (ref 1.7–7.7)
Neutrophils Relative %: 61 %
Platelet Count: 242 10*3/uL (ref 150–400)
RBC: 3.78 MIL/uL — ABNORMAL LOW (ref 4.22–5.81)
RDW: 13.9 % (ref 11.5–15.5)
WBC Count: 6.3 10*3/uL (ref 4.0–10.5)
nRBC: 0 % (ref 0.0–0.2)

## 2022-11-22 MED ORDER — SODIUM CHLORIDE 0.9% FLUSH
10.0000 mL | Freq: Once | INTRAVENOUS | Status: AC
  Administered 2022-11-22: 10 mL via INTRAVENOUS
  Filled 2022-11-22: qty 10

## 2022-11-22 MED ORDER — LOPERAMIDE HCL 2 MG PO CAPS: 2.0000 mg | ORAL_CAPSULE | ORAL | 2 refills | Status: AC

## 2022-11-22 MED ORDER — HEPARIN SOD (PORK) LOCK FLUSH 100 UNIT/ML IV SOLN
500.0000 [IU] | Freq: Once | INTRAVENOUS | Status: AC
  Administered 2022-11-22: 500 [IU] via INTRAVENOUS
  Filled 2022-11-22: qty 5

## 2022-11-22 NOTE — Assessment & Plan Note (Signed)
On Xarelto for anticoagulation.   

## 2022-11-22 NOTE — Assessment & Plan Note (Signed)
Follow-up with nutritionist Continue nutrition supplements.  Recommend him to hold off Statin.

## 2022-11-22 NOTE — Assessment & Plan Note (Signed)
Recommend imodium PRN as directed  

## 2022-11-22 NOTE — Assessment & Plan Note (Signed)
Continue follow-up with palliative care service. continue oxycodone 10-20mg  Q 4-6 hours PRN.

## 2022-11-22 NOTE — Progress Notes (Signed)
Hematology/Oncology Progress note [phone] [phone]        REFERRING PROVIDER: Rosemarie Ax, MD    CHIEF COMPLAINTS/PURPOSE OF CONSULTATION:  Esophageal cancer.   ASSESSMENT & PLAN:   Cancer Staging  Esophageal adenocarcinoma [facility]) Staging form: Esophagus - Adenocarcinoma, AJCC 8th Edition - Clinical stage from 11/03/2022: Stage IVB (cTX, cNX, cM1) - Signed by Rickard Patience, MD on 11/03/2022  Malignant neoplasm of urinary bladder [facility]) Staging form: Urinary Bladder, AJCC 7th Edition - Clinical: Stage 0a (Ta, N0, M0) - Signed by Rickard Patience, MD on 11/03/2022   Esophageal adenocarcinoma [facility]) Image findings and pathology results are reviewed with patient and wife.  PET scan results were reviewed and discussed with patient.  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  Labs are reviewed and discussed with patient. S/p 1 cycle of  FOLFOX.  He tolerates well.  NGS results come back today positive for HER2 copy again. Plan to add trastuzumab to her next visit.  Baseline MUGA LVEF 58%   Neoplasm related pain Continue follow-up with palliative care service. continue oxycodone 10-20mg  Q 4-6 hours PRN.    Atrial fibrillation and flutter (HCC) On Xarelto for anticoagulation.    Protein calorie malnutrition (HCC) Follow-up with nutritionist Continue nutrition supplements.  Recommend him to hold off Statin.    Chemotherapy induced diarrhea Recommend imodium PRN as directed    No orders of the defined types were placed in this encounter.   Follow-up in 2 weeks for lab MD FOLFOX and trastuzumab. All questions were answered. The patient knows to call the clinic with any problems, questions or concerns.  Rickard Patience, MD, PhD [facility] Hematology Oncology 11/22/2022    HISTORY OF PRESENTING ILLNESS:  Aaron Short 77 y.o. male presents to establish care for esophageal adenocarcinoma.  I have reviewed his  chart and materials related to his cancer extensively and collaborated history with the patient. Summary of oncologic history is as follows: Oncology History  Malignant neoplasm of urinary bladder (HCC)  10/23/2013 Initial Diagnosis   Malignant neoplasm of urinary bladder (HCC)   11/03/2022 Cancer Staging   Staging form: Urinary Bladder, AJCC 7th Edition - Clinical: Stage 0a (Ta, N0, M0) - Signed by Rickard Patience, MD on 11/03/2022   Esophageal adenocarcinoma (HCC)  10/20/2022 Imaging   CT abdomen pelvis w contrast  -Multiple bilobar hepatic lesions extending throughout most of the liver parenchyma and concerning for metastatic disease. Correlation with tissue sampling could be considered for definitive pathologic diagnosis.   - Circumferential wall thickening of the distal esophagus and gastroesophageal junction. Recommend further evaluation with endoscopy to exclude underlying malignancy. No other evidence of primary malignancy in the abdomen or pelvis although note is made that evaluation of the pelvic structures is very limited in this CT scan due to extensive artifact secondary to the patient's bilateral total hip arthroplasty.   - Cystic lesion in the pancreatic tail measuring up to 1.0 cm, possibly a sidebranch IPMN. No obvious pancreatic ductal dilatation. Further evaluation with MRI/MRCP could be considered.   - Extensive degenerative changes to the spine with compression deformities of T11, T12 and L2, similar to prior.    10/20/2022 Imaging   CT chest w contrast   New 4 mm nodule in the posterior segment of left lower lobe, 3 mm nodule in the left upper chest of pulmonary metastasis.   Stable mild centrilobular emphysema and mild peripheral reticulation which may reflect smoking-related lung fibrosis.   Mildly dilated esophagus with  air-fluid level and circumferential thickening in the distal esophagus which may reflect chronic esophagitis. Consider endoscopic evaluation.    11/03/2022  Initial Diagnosis   Esophageal adenocarcinoma   + dysphagia for both liquid and solid food, cough after eating. Unintentional weight loss 50 pounds over the past years, 14 pounds within last year.   10/30/22 EGD showed A large, submucosal mass with no bleeding and stigmata of recent bleeding was found in the lower third of the esophagus, 35 to 40 cm from the incisors. The mass was partially obstructing and circumferential. Biopsies were taken with a cold forceps for histology.  Pathology showed moderately differentiated adenocarcinoma involving squamocolumnar junctional mucosa with focal intestinal metaplasia.   [facility] NGS showed ERBB2 copy number gain, TP53 stop gain, NSD1 frameshift, RARA copy number gain, TOP2A copy number gain. TMB 7.9 m/mb, MSI stable.    11/03/2022 Cancer Staging   Staging form: Esophagus - Adenocarcinoma, AJCC 8th Edition - Clinical stage from 11/03/2022: Stage IVB (cTX, cNX, cM1) - Signed by Rickard Patience, MD on 11/03/2022 Stage prefix: Initial diagnosis   11/13/2022 Imaging   PET scan showed 1. Tracer avid tumor is identified within the distal esophagus extending into the cardia. 2. Extensive tracer avid liver metastases. 3. Tiny nodule within the medial left lower lobe is too small to characterize by PET-CT measuring 4 mm. Attention to this nodule on future surveillance imaging is advised. 4. Asymmetric uptake localizing to the right first rib costosternal junction and left AC joint is noted. Findings are favored to represent arthropathic change 5. Gallstones. 6.  Aortic Atherosclerosis   11/13/2022 Procedure   Mediport placement by IR   11/15/2022 - 11/17/2022 Chemotherapy   Patient is on Treatment Plan : GASTROESOPHAGEAL FOLFOX q14d x 6 cycles     11/29/2022 -  Chemotherapy   Patient is on Treatment Plan : GASTROESOPHAGEAL Trastuzumab (6/4) D1 + FOLFOX D1 q14d x 12 cycles / Trastuzumab q14d      He is a current every day smoker. He denies abdominal pain, nausea  vomiting. Gait is not steady, frequent falls, this is a chronic issue.  Aifb on Xarelto. He currently is able to eat soft moist food.  + chronic neck and back pain, joint pain, he takes oxycodone 10-20mg  every 4-6 hours. Pain is well controlled. He feels much better  Appetite is slightly better, lost 1-2 pounds since last visit.  He has had diarrhea for 1 day after chemo, he took otc imodium PRN, diarrhea improved the next day.    MEDICAL HISTORY:  Past Medical History:  Diagnosis Date   Arthralgia of lower leg 04/02/2012   Arthralgia of upper arm 06/18/2009   Cancer (HCC)    Diabetes mellitus without complication (HCC)    FH: atrial fibrillation    Hypertension     SURGICAL HISTORY: Past Surgical History:  Procedure Laterality Date   bladder cancer surgery  2015   ESOPHAGOGASTRODUODENOSCOPY (EGD) WITH PROPOFOL N/A 10/30/2022   Procedure: ESOPHAGOGASTRODUODENOSCOPY (EGD) WITH PROPOFOL;  Surgeon: Toney Reil, MD;  Location: [facility] ENDOSCOPY;  Service: Gastroenterology;  Laterality: N/A;   IR IMAGING GUIDED PORT INSERTION  11/13/2022   JOINT REPLACEMENT     REPLACEMENT TOTAL HIP W/  RESURFACING IMPLANTS Bilateral    SHOULDER SURGERY Bilateral    TOTAL KNEE ARTHROPLASTY Bilateral     SOCIAL HISTORY: Social History   Socioeconomic History   Marital status: Married    Spouse name: Not on file   Number of children: Not on file  Years of education: Not on file   Highest education level: Not on file  Occupational History   Not on file  Tobacco Use   Smoking status: Every Day    Packs/day: .5    Types: Cigarettes   Smokeless tobacco: Never  Vaping Use   Vaping Use: Never used  Substance and Sexual Activity   Alcohol use: No    Alcohol/week: 0.0 standard drinks of alcohol   Drug use: No   Sexual activity: Not on file  Other Topics Concern   Not on file  Social History Narrative   Lives with wife, [name].    Social Determinants of Health   Financial Resource Strain:  Low Risk  (11/03/2022)   Overall Financial Resource Strain (CARDIA)    Difficulty of Paying Living Expenses: Not very hard  Food Insecurity: No Food Insecurity (11/03/2022)   Hunger Vital Sign    Worried About Running Out of Food in the Last Year: Never true    Ran Out of Food in the Last Year: Never true  Transportation Needs: No Transportation Needs (11/03/2022)   PRAPARE - Administrator, Civil Service (Medical): No    Lack of Transportation (Non-Medical): No  Physical Activity: Not on file  Stress: No Stress Concern Present (11/03/2022)   [employer] of Occupational Health - Occupational Stress Questionnaire    Feeling of Stress : Only a little  Social Connections: Not on file  Intimate Partner Violence: Not At Risk (11/03/2022)   Humiliation, Afraid, Rape, and Kick questionnaire    Fear of Current or Ex-Partner: No    Emotionally Abused: No    Physically Abused: No    Sexually Abused: No    FAMILY HISTORY: Family History  Problem Relation Age of Onset   Dementia Mother    Heart disease Father     ALLERGIES:  is allergic to diltiazem, penicillins, and tizanidine.  MEDICATIONS:  Current Outpatient Medications  Medication Sig Dispense Refill   acetaminophen (TYLENOL) 500 MG tablet Take 500 mg by mouth every 8 (eight) hours as needed.     ALPRAZolam (XANAX) 0.5 MG tablet Take 1 tablet (0.5 mg total) by mouth See admin instructions. Take 1 tablet 30 minutes prior to imaging procedure, may take another tablet if needed. 2 tablet 0   B Complex-C (VITAMIN B + C COMPLEX) TABS Take 1 capsule by mouth every morning.     carisoprodol (SOMA) 350 MG tablet Take 350 mg by mouth 2 (two) times daily.      Cetirizine HCl 10 MG CAPS Take 1 capsule by mouth every morning.     dexamethasone (DECADRON) 4 MG tablet Take 2 tablets (8 mg total) by mouth daily. Start the day after chemotherapy for 2 days. Take with food. 30 tablet 1   lidocaine-prilocaine (EMLA) cream Apply to affected  area once 30 g 3   loperamide (IMODIUM) 2 MG capsule Take 1 capsule (2 mg total) by mouth See admin instructions. Initial: 4 mg,the 2 mg every 2 hours (4 mg every 4 hours at night)  maximum: 16 mg/day 60 capsule 2   Magnesium 500 MG TABS Take 500 mg by mouth as needed.     melatonin (MELATONIN MAXIMUM STRENGTH) 5 MG TABS Take 5 mg by mouth at bedtime as needed.     omeprazole (PRILOSEC) 10 MG capsule Take 20 mg by mouth daily.     ondansetron (ZOFRAN) 8 MG tablet Take by mouth.     ondansetron (ZOFRAN) 8 MG  tablet Take 1 tablet (8 mg total) by mouth every 8 (eight) hours as needed for nausea or vomiting. Start on the third day after chemotherapy. 30 tablet 1   Oxycodone HCl 10 MG TABS Take 1-2 tablets (10-20 mg total) by mouth every 4 (four) hours as needed (pain). 120 tablet 0   prochlorperazine (COMPAZINE) 10 MG tablet Take 1 tablet (10 mg total) by mouth every 6 (six) hours as needed for nausea or vomiting. 30 tablet 1   rivaroxaban (XARELTO) 20 MG TABS tablet Take 20 mg by mouth.     rosuvastatin (CRESTOR) 20 MG tablet Take 20 mg by mouth daily.     sertraline (ZOLOFT) 50 MG tablet Take by mouth.     naloxone (NARCAN) nasal spray 4 mg/0.1 mL Place 1 spray into the nose once for 1 dose. Spray half of bottle content into each nostril, then call 911 2 each 0   prazosin (MINIPRESS) 1 MG capsule Take 1 mg by mouth at bedtime. (Patient not taking: Reported on 11/22/2022)     No current facility-administered medications for this visit.    Review of Systems  Constitutional:  Positive for appetite change, fatigue and unexpected weight change. Negative for chills and fever.  HENT:   Negative for hearing loss and voice change.   Eyes:  Negative for eye problems and icterus.  Respiratory:  Negative for chest tightness, cough and shortness of breath.   Cardiovascular:  Negative for chest pain and leg swelling.  Gastrointestinal:  Negative for abdominal distention and abdominal pain.       Heart burn   Endocrine: Negative for hot flashes.  Genitourinary:  Negative for difficulty urinating, dysuria and frequency.   Musculoskeletal:  Positive for arthralgias, back pain, gait problem and neck pain.  Skin:  Negative for itching and rash.  Neurological:  Positive for gait problem. Negative for light-headedness and numbness.       Falls.   Hematological:  Negative for adenopathy. Does not bruise/bleed easily.  Psychiatric/Behavioral:  Negative for confusion.      PHYSICAL EXAMINATION: ECOG PERFORMANCE STATUS: 1 - Symptomatic but completely ambulatory  Vitals:   11/22/22 0836  BP: 109/65  Pulse: 96  Resp: 18  Temp: (!) 97.1 F (36.2 C)  SpO2: 97%   Filed Weights   11/22/22 0836  Weight: 136 lb 12.8 oz (62.1 kg)    Physical Exam Constitutional:      General: He is not in acute distress.    Appearance: He is ill-appearing. He is not diaphoretic.  HENT:     Head: Normocephalic and atraumatic.  Eyes:     General: No scleral icterus.    Pupils: Pupils are equal, round, and reactive to light.  Cardiovascular:     Rate and Rhythm: Normal rate and regular rhythm.     Heart sounds: No murmur heard. Pulmonary:     Effort: Pulmonary effort is normal. No respiratory distress.     Breath sounds: Normal breath sounds. No wheezing.  Abdominal:     General: There is no distension.     Palpations: Abdomen is soft.     Tenderness: There is no abdominal tenderness.  Musculoskeletal:        General: Normal range of motion.     Cervical back: Normal range of motion and neck supple.  Skin:    General: Skin is warm and dry.     Findings: No erythema.  Neurological:     Mental Status: He is alert and oriented  to person, place, and time. Mental status is at baseline.     Cranial Nerves: No cranial nerve deficit.     Motor: No abnormal muscle tone.  Psychiatric:        Mood and Affect: Mood and affect normal.      LABORATORY DATA:  I have reviewed the data as listed    Latest Ref  Rng & Units 11/22/2022    7:55 AM 11/15/2022    7:58 AM 11/03/2022   12:55 PM  CBC  WBC 4.0 - 10.5 K/uL 6.3  8.4  8.7   Hemoglobin 13.0 - 17.0 g/dL 16.1  09.6  04.5   Hematocrit 39.0 - 52.0 % 35.7  35.3  36.6   Platelets 150 - 400 K/uL 242  289  298       Latest Ref Rng & Units 11/22/2022    7:55 AM 11/15/2022    7:58 AM 11/03/2022   12:55 PM  CMP  Glucose 70 - 99 mg/dL 409  811  914   BUN 8 - 23 mg/dL 22  22  22    Creatinine 0.61 - 1.24 mg/dL 7.82  9.56  2.13   Sodium 135 - 145 mmol/L 135  137  138   Potassium 3.5 - 5.1 mmol/L 4.0  4.2  4.2   Chloride 98 - 111 mmol/L 97  98  98   CO2 22 - 32 mmol/L 28  29  30    Calcium 8.9 - 10.3 mg/dL 9.0  9.1  9.0   Total Protein 6.5 - 8.1 g/dL 6.7  7.0  6.9   Total Bilirubin 0.3 - 1.2 mg/dL 0.5  0.7  0.3   Alkaline Phos 38 - 126 U/L 153  177  167   AST 15 - 41 U/L 68  71  59   ALT 0 - 44 U/L 29  28  24       RADIOGRAPHIC STUDIES: I have personally reviewed the radiological images as listed and agreed with the findings in the report. NM Cardiac Muga Rest  Result Date: 11/16/2022 CLINICAL DATA:  baseline heart function for chemo eval EXAM: NUCLEAR MEDICINE CARDIAC BLOOD POOL IMAGING (MUGA) TECHNIQUE: Cardiac multi-gated acquisition was performed at rest following intravenous injection of Tc-11m labeled red blood cells. RADIOPHARMACEUTICALS:  21.1 mCi Tc-40m pertechnetate in-vitro labeled red blood cells IV COMPARISON:  PET-CT, 11/13/2022 CTA chest 10/20/2022 FINDINGS: LEFT ventricular wall normal in size and demonstrate normal motion. Ventricular cavities normal. Calculated LEFT ejection fraction is calculated at 58%. No prior comparison MPI or MUGA. IMPRESSION: 1. Normal LEFT ventricular wall motion. 2. Calculated LVEF 58% Electronically Signed   By: Roanna Banning M.D.   On: 11/16/2022 16:02   IR IMAGING GUIDED PORT INSERTION  Result Date: 11/13/2022 INDICATION: Esophageal adenocarcinoma. Patient presents for port catheter placement. EXAM: IMPLANTED  PORT A CATH PLACEMENT WITH ULTRASOUND AND FLUOROSCOPIC GUIDANCE MEDICATIONS: None. ANESTHESIA/SEDATION: Versed 1 mg IV; Fentanyl 75 mcg IV; Moderate Sedation Time:  14 minutes The patient's vital signs and level of consciousness were continuously monitored during the procedure by the interventional radiology nurse under my direct supervision. FLUOROSCOPY: Radiation exposure index: 1 mGy reference air kerma COMPLICATIONS: None immediate. PROCEDURE: The right neck and chest was prepped with chlorhexidine, and draped in the usual sterile fashion using maximum barrier technique (cap and mask, sterile gown, sterile gloves, large sterile sheet, hand hygiene and cutaneous antiseptic). Local anesthesia was attained by infiltration with 1% lidocaine with epinephrine. Ultrasound demonstrated patency of the right internal jugular  vein, and this was documented with an image. Under real-time ultrasound guidance, this vein was accessed with a 21 gauge micropuncture needle and image documentation was performed. A small dermatotomy was made at the access site with an 11 scalpel. A 0.018" wire was advanced into the SVC and the access needle exchanged for a 37F micropuncture vascular sheath. The 0.018" wire was then removed and a 0.035" wire advanced into the IVC. An appropriate location for the subcutaneous reservoir was selected below the clavicle and an incision was made through the skin and underlying soft tissues. The subcutaneous tissues were then dissected using a combination of blunt and sharp surgical technique and a pocket was formed. A Bard ISP single lumen power injectable portacatheter was then tunneled through the subcutaneous tissues from the pocket to the dermatotomy and the port reservoir placed within the subcutaneous pocket. The venous access site was then serially dilated and a peel away vascular sheath placed over the wire. The wire was removed and the port catheter advanced into position under fluoroscopic  guidance. The catheter tip is positioned in the superior cavoatrial junction. This was documented with a spot image. The portacatheter was then tested and found to flush and aspirate well. The port was flushed with saline followed by 100 units/mL heparinized saline. The pocket was then closed in two layers using first subdermal inverted interrupted absorbable sutures followed by a running subcuticular suture. The epidermis was then sealed with Dermabond. The dermatotomy at the venous access site was also closed with Dermabond. IMPRESSION: Successful placement of a right IJ approach Power Port with ultrasound and fluoroscopic guidance. The catheter is ready for use. Electronically Signed   By: Malachy Moan M.D.   On: 11/13/2022 15:27   NM PET Image Initial (PI) Skull Base To Thigh  Result Date: 11/13/2022 CLINICAL DATA:  Initial treatment strategy for esophageal carcinoma. EXAM: NUCLEAR MEDICINE PET SKULL BASE TO THIGH TECHNIQUE: 7.65 mCi F-18 FDG was injected intravenously. Full-ring PET imaging was performed from the skull base to thigh after the radiotracer. CT data was obtained and used for attenuation correction and anatomic localization. Fasting blood glucose: 93 mg/dl COMPARISON:  CT 96/08/5407 FINDINGS: Mediastinal blood pool activity: SUV max 1.39 Liver activity: SUV max NA NECK: No hypermetabolic lymph nodes in the neck. Incidental CT findings: None. CHEST: Tracer avid tumor within the distal esophagus has an SUV max 9.97 with corresponding mild circumferential wall thickening on the CT images. No tracer avid mediastinal or hilar lymph nodes. Tiny nodule within the medial left lower lobe is too small to characterize by PET-CT measuring 4 mm with SUV max 1.26, image 59/4. Incidental CT findings: Signs of chronic postinflammatory lung disease identified with bilateral peripheral interstitial reticulation. Aortic atherosclerosis with coronary artery calcifications. ABDOMEN/PELVIS: Extensive tracer  avid liver metastases are identified throughout both lobes of the liver. Lesions are too numerous to count. Reference lesions include: -within segment 7/8 right hepatic lobe lesion measures 8.8 x 7.1 cm within SUV max of 30.72, image 72/4. -within segment 3 lesion measures 3.6 x 3.0 cm with SUV max of 25.08, image 88/4. -within segment 6 lesion measures 5.4 x 4.2 cm with SUV max 30.95, image 100/4. Tracer avid tumor extending from the distal esophagus into the cardia has an SUV max 14.45, image 75/4. No tracer avid abdominopelvic lymph nodes. Incidental CT findings: Aortic atherosclerosis. Small gallstones identified. SKELETON: No focal hypermetabolic activity to suggest skeletal metastasis. Incidental CT findings: There is asymmetric increased uptake localizing to the right first  rib costosternal junction with SUV max 5.78, image 48/4. Asymmetric uptake localizing to the left [facility] joint is also noted. Status post bilateral hip arthroplasty. IMPRESSION: 1. Tracer avid tumor is identified within the distal esophagus extending into the cardia. 2. Extensive tracer avid liver metastases. 3. Tiny nodule within the medial left lower lobe is too small to characterize by PET-CT measuring 4 mm. Attention to this nodule on future surveillance imaging is advised. 4. Asymmetric uptake localizing to the right first rib costosternal junction and left AC joint is noted. Findings are favored to represent arthropathic change 5. Gallstones. 6.  Aortic Atherosclerosis (ICD10-I70.0). Electronically Signed   By: Signa Kell M.D.   On: 11/13/2022 10:20   DG SWALLOW FUNC OP MEDICARE SPEECH PATH  Result Date: 11/09/2022 CLINICAL DATA:  Provided history: Cough, unspecified type. Dysphagia, unspecified type. EXAM: MODIFIED BARIUM SWALLOW TECHNIQUE: Different consistencies of barium were administered orally to the patient by the speech pathologist. Imaging of the pharynx was performed in the lateral projection. Mina Marble, PA-C  (supervised by Dr. Jackey Loge) was present in the fluoroscopy room for the study, and operated the fluoroscopy equipment. FLUOROSCOPY: Radiation Exposure Index (as provided by the fluoroscopic device): 6.70 mGy Kerma COMPARISON:  None. FINDINGS: Different consistencies of barium were administered orally to the patient by the speech pathologist with fluoroscopic imaging of the pharynx from a lateral projection. Mina Marble, PA-C (supervised by Dr. Jackey Loge) was present in the fluoroscopy room and operated the fluoroscopy equipment. The speech pathologist observed aspiration with thin and nectar consistencies. Please refer to the speech pathologist's report for full details. Bridging ventral cervical osteophyte noted at C4-C5. IMPRESSION: 1. Modified barium swallow as described. 2. Aspiration was observed with thin and nectar consistencies. Please refer to the speech pathologist's report for complete details and recommendations. Electronically Signed   By: Jackey Loge D.O.   On: 11/09/2022 14:06

## 2022-11-22 NOTE — Assessment & Plan Note (Addendum)
Image findings and pathology results are reviewed with patient and wife.  PET scan results were reviewed and discussed with patient.  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  Labs are reviewed and discussed with patient. S/p 1 cycle of  FOLFOX.  He tolerates well.  NGS results come back today positive for HER2 copy again. Plan to add trastuzumab to her next visit.  Baseline MUGA LVEF 58%

## 2022-11-24 ENCOUNTER — Ambulatory Visit: Payer: Medicare Other | Admitting: Oncology

## 2022-11-24 ENCOUNTER — Ambulatory Visit: Payer: Medicare Other

## 2022-11-24 ENCOUNTER — Other Ambulatory Visit: Payer: Medicare Other

## 2022-11-28 MED FILL — Dexamethasone Sodium Phosphate Inj 100 MG/10ML: INTRAMUSCULAR | Qty: 1 | Status: AC

## 2022-11-29 ENCOUNTER — Inpatient Hospital Stay: Payer: Medicare Other

## 2022-11-29 ENCOUNTER — Inpatient Hospital Stay (HOSPITAL_BASED_OUTPATIENT_CLINIC_OR_DEPARTMENT_OTHER): Payer: Medicare Other | Admitting: Hospice and Palliative Medicine

## 2022-11-29 ENCOUNTER — Encounter: Payer: Self-pay | Admitting: Oncology

## 2022-11-29 ENCOUNTER — Ambulatory Visit: Payer: Medicare Other | Admitting: Oncology

## 2022-11-29 ENCOUNTER — Inpatient Hospital Stay: Payer: Medicare Other | Attending: Oncology | Admitting: Oncology

## 2022-11-29 ENCOUNTER — Ambulatory Visit: Payer: Medicare Other

## 2022-11-29 ENCOUNTER — Other Ambulatory Visit: Payer: Medicare Other

## 2022-11-29 VITALS — BP 106/56 | HR 97 | Temp 96.8°F | Ht 68.0 in | Wt 134.5 lb

## 2022-11-29 DIAGNOSIS — Z515 Encounter for palliative care: Secondary | ICD-10-CM | POA: Insufficient documentation

## 2022-11-29 DIAGNOSIS — T451X5D Adverse effect of antineoplastic and immunosuppressive drugs, subsequent encounter: Secondary | ICD-10-CM | POA: Insufficient documentation

## 2022-11-29 DIAGNOSIS — I4891 Unspecified atrial fibrillation: Secondary | ICD-10-CM | POA: Insufficient documentation

## 2022-11-29 DIAGNOSIS — Z7952 Long term (current) use of systemic steroids: Secondary | ICD-10-CM | POA: Insufficient documentation

## 2022-11-29 DIAGNOSIS — R300 Dysuria: Secondary | ICD-10-CM | POA: Diagnosis not present

## 2022-11-29 DIAGNOSIS — I4892 Unspecified atrial flutter: Secondary | ICD-10-CM | POA: Diagnosis not present

## 2022-11-29 DIAGNOSIS — Z8551 Personal history of malignant neoplasm of bladder: Secondary | ICD-10-CM | POA: Insufficient documentation

## 2022-11-29 DIAGNOSIS — Z452 Encounter for adjustment and management of vascular access device: Secondary | ICD-10-CM | POA: Insufficient documentation

## 2022-11-29 DIAGNOSIS — F17210 Nicotine dependence, cigarettes, uncomplicated: Secondary | ICD-10-CM | POA: Insufficient documentation

## 2022-11-29 DIAGNOSIS — T451X5A Adverse effect of antineoplastic and immunosuppressive drugs, initial encounter: Secondary | ICD-10-CM

## 2022-11-29 DIAGNOSIS — Z7901 Long term (current) use of anticoagulants: Secondary | ICD-10-CM | POA: Diagnosis not present

## 2022-11-29 DIAGNOSIS — Z5112 Encounter for antineoplastic immunotherapy: Secondary | ICD-10-CM | POA: Diagnosis not present

## 2022-11-29 DIAGNOSIS — G893 Neoplasm related pain (acute) (chronic): Secondary | ICD-10-CM

## 2022-11-29 DIAGNOSIS — C787 Secondary malignant neoplasm of liver and intrahepatic bile duct: Secondary | ICD-10-CM | POA: Insufficient documentation

## 2022-11-29 DIAGNOSIS — K521 Toxic gastroenteritis and colitis: Secondary | ICD-10-CM | POA: Diagnosis not present

## 2022-11-29 DIAGNOSIS — S32000D Wedge compression fracture of unspecified lumbar vertebra, subsequent encounter for fracture with routine healing: Secondary | ICD-10-CM | POA: Insufficient documentation

## 2022-11-29 DIAGNOSIS — C7802 Secondary malignant neoplasm of left lung: Secondary | ICD-10-CM | POA: Diagnosis not present

## 2022-11-29 DIAGNOSIS — Z79899 Other long term (current) drug therapy: Secondary | ICD-10-CM | POA: Insufficient documentation

## 2022-11-29 DIAGNOSIS — E46 Unspecified protein-calorie malnutrition: Secondary | ICD-10-CM | POA: Insufficient documentation

## 2022-11-29 DIAGNOSIS — Z5111 Encounter for antineoplastic chemotherapy: Secondary | ICD-10-CM | POA: Diagnosis present

## 2022-11-29 DIAGNOSIS — R3915 Urgency of urination: Secondary | ICD-10-CM | POA: Diagnosis not present

## 2022-11-29 DIAGNOSIS — Z96643 Presence of artificial hip joint, bilateral: Secondary | ICD-10-CM | POA: Diagnosis not present

## 2022-11-29 DIAGNOSIS — C159 Malignant neoplasm of esophagus, unspecified: Secondary | ICD-10-CM | POA: Diagnosis not present

## 2022-11-29 LAB — CMP (CANCER CENTER ONLY)
ALT: 25 U/L (ref 0–44)
AST: 55 U/L — ABNORMAL HIGH (ref 15–41)
Albumin: 3.3 g/dL — ABNORMAL LOW (ref 3.5–5.0)
Alkaline Phosphatase: 138 U/L — ABNORMAL HIGH (ref 38–126)
Anion gap: 10 (ref 5–15)
BUN: 21 mg/dL (ref 8–23)
CO2: 28 mmol/L (ref 22–32)
Calcium: 9.2 mg/dL (ref 8.9–10.3)
Chloride: 98 mmol/L (ref 98–111)
Creatinine: 0.7 mg/dL (ref 0.61–1.24)
GFR, Estimated: >60 mL/min (ref >60)
Glucose, Bld: 144 mg/dL — ABNORMAL HIGH (ref 70–99)
Potassium: 3.8 mmol/L (ref 3.5–5.1)
Sodium: 136 mmol/L (ref 135–145)
Total Bilirubin: 0.4 mg/dL (ref 0.3–1.2)
Total Protein: 6.8 g/dL (ref 6.5–8.1)

## 2022-11-29 LAB — URINALYSIS, COMPLETE (UACMP) WITH MICROSCOPIC
Bacteria, UA: NONE SEEN
Bilirubin Urine: NEGATIVE
Glucose, UA: NEGATIVE mg/dL
Hgb urine dipstick: NEGATIVE
Ketones, ur: NEGATIVE mg/dL
Leukocytes,Ua: NEGATIVE
Nitrite: NEGATIVE
Protein, ur: 100 mg/dL — AB
Specific Gravity, Urine: 1.033 — ABNORMAL HIGH (ref 1.005–1.030)
pH: 5 (ref 5.0–8.0)

## 2022-11-29 LAB — CBC WITH DIFFERENTIAL (CANCER CENTER ONLY)
Abs Immature Granulocytes: 0.04 10*3/uL (ref 0.00–0.07)
Basophils Absolute: 0 10*3/uL (ref 0.0–0.1)
Basophils Relative: 1 %
Eosinophils Absolute: 0.1 10*3/uL (ref 0.0–0.5)
Eosinophils Relative: 1 %
HCT: 34 % — ABNORMAL LOW (ref 39.0–52.0)
Hemoglobin: 11.6 g/dL — ABNORMAL LOW (ref 13.0–17.0)
Immature Granulocytes: 1 %
Lymphocytes Relative: 26 %
Lymphs Abs: 2 10*3/uL (ref 0.7–4.0)
MCH: 32.6 pg (ref 26.0–34.0)
MCHC: 34.1 g/dL (ref 30.0–36.0)
MCV: 95.5 fL (ref 80.0–100.0)
Monocytes Absolute: 1 10*3/uL (ref 0.1–1.0)
Monocytes Relative: 13 %
Neutro Abs: 4.7 10*3/uL (ref 1.7–7.7)
Neutrophils Relative %: 58 %
Platelet Count: 200 10*3/uL (ref 150–400)
RBC: 3.56 MIL/uL — ABNORMAL LOW (ref 4.22–5.81)
RDW: 14.5 % (ref 11.5–15.5)
WBC Count: 7.9 10*3/uL (ref 4.0–10.5)
nRBC: 0 % (ref 0.0–0.2)

## 2022-11-29 MED ORDER — TRASTUZUMAB-ANNS CHEMO 150 MG IV SOLR
6.0000 mg/kg | Freq: Once | INTRAVENOUS | Status: AC
  Administered 2022-11-29: 378 mg via INTRAVENOUS
  Filled 2022-11-29: qty 18

## 2022-11-29 MED ORDER — MORPHINE SULFATE ER 15 MG PO TBCR: 15.0000 mg | EXTENDED_RELEASE_TABLET | Freq: Two times a day (BID) | ORAL | 0 refills | Status: DC

## 2022-11-29 MED ORDER — OXYCODONE HCL 10 MG PO TABS: 10.0000 mg | ORAL_TABLET | ORAL | 0 refills | Status: DC | PRN

## 2022-11-29 MED ORDER — PALONOSETRON HCL INJECTION 0.25 MG/5ML
0.2500 mg | Freq: Once | INTRAVENOUS | Status: AC
  Administered 2022-11-29: 0.25 mg via INTRAVENOUS
  Filled 2022-11-29: qty 5

## 2022-11-29 MED ORDER — SODIUM CHLORIDE 0.9 % IV SOLN
2400.0000 mg/m2 | INTRAVENOUS | Status: DC
  Administered 2022-11-29: 4150 mg via INTRAVENOUS
  Filled 2022-11-29: qty 83

## 2022-11-29 MED ORDER — MEGESTROL ACETATE 20 MG PO TABS: 20.0000 mg | ORAL_TABLET | Freq: Two times a day (BID) | ORAL | 0 refills | Status: DC

## 2022-11-29 MED ORDER — FLUOROURACIL CHEMO INJECTION 2.5 GM/50ML
400.0000 mg/m2 | Freq: Once | INTRAVENOUS | Status: AC
  Administered 2022-11-29: 700 mg via INTRAVENOUS
  Filled 2022-11-29: qty 14

## 2022-11-29 MED ORDER — LEUCOVORIN CALCIUM INJECTION 350 MG
400.0000 mg/m2 | Freq: Once | INTRAVENOUS | Status: AC
  Administered 2022-11-29: 692 mg via INTRAVENOUS
  Filled 2022-11-29: qty 34.6

## 2022-11-29 MED ORDER — SODIUM CHLORIDE 0.9 % IV SOLN
10.0000 mg | Freq: Once | INTRAVENOUS | Status: AC
  Administered 2022-11-29: 10 mg via INTRAVENOUS
  Filled 2022-11-29: qty 10

## 2022-11-29 MED ORDER — OXALIPLATIN CHEMO INJECTION 100 MG/20ML
70.0000 mg/m2 | Freq: Once | INTRAVENOUS | Status: AC
  Administered 2022-11-29: 120 mg via INTRAVENOUS
  Filled 2022-11-29: qty 4

## 2022-11-29 MED ORDER — ACETAMINOPHEN 325 MG PO TABS
650.0000 mg | ORAL_TABLET | Freq: Once | ORAL | Status: AC
  Administered 2022-11-29: 650 mg via ORAL
  Filled 2022-11-29: qty 2

## 2022-11-29 MED ORDER — SODIUM CHLORIDE 0.9 % IV SOLN
Freq: Once | INTRAVENOUS | Status: AC
  Filled 2022-11-29: qty 250

## 2022-11-29 MED ORDER — DEXTROSE 5 % IV SOLN
Freq: Once | INTRAVENOUS | Status: AC
  Filled 2022-11-29: qty 250

## 2022-11-29 MED ORDER — DIPHENHYDRAMINE HCL 25 MG PO CAPS
50.0000 mg | ORAL_CAPSULE | Freq: Once | ORAL | Status: AC
  Administered 2022-11-29: 50 mg via ORAL
  Filled 2022-11-29: qty 2

## 2022-11-29 NOTE — Assessment & Plan Note (Signed)
Continue follow-up with palliative care service. continue oxycodone 10-20mg  Q 4-6 hours PRN.  Add long acting agent.

## 2022-11-29 NOTE — Progress Notes (Signed)
C/o pain and burning with urination.  Discuss something to increase appetite.  Would like to know when to get another scan.  Has questions about the targeting chemo he will be getting, what area will it target?  Refill oxycodone, last received #120, states doesn't last all month, asking for #180.

## 2022-11-29 NOTE — Progress Notes (Signed)
Palliative Medicine [facility] at [facility] [phone] [phone]   Name: Aaron Short Date: 11/29/2022 MRN: [id number]  DOB: 23-Jun-1945  Patient Care Team: Rosemarie Ax, MD as PCP - General Benita Gutter, RN as Oncology Nurse Navigator    REASON FOR CONSULTATION: Aaron Short is a 77 y.o. male with multiple medical problems including chronic pain previously followed by pain clinic, recently diagnosed with stage IV esophageal adenocarcinoma.  Palliative care was consulted to address goals and manage ongoing symptoms.   SOCIAL HISTORY:     reports that he has been smoking cigarettes. He has been smoking an average of .5 packs per day. He has never used smokeless tobacco. He reports that he does not drink alcohol and does not use drugs.  Patient is married and lives at home with his wife.  ADVANCE DIRECTIVES:    CODE STATUS:   PAST MEDICAL HISTORY: Past Medical History:  Diagnosis Date   Arthralgia of lower leg 04/02/2012   Arthralgia of upper arm 06/18/2009   Cancer (HCC)    Diabetes mellitus without complication (HCC)    FH: atrial fibrillation    Hypertension     PAST SURGICAL HISTORY:  Past Surgical History:  Procedure Laterality Date   bladder cancer surgery  2015   ESOPHAGOGASTRODUODENOSCOPY (EGD) WITH PROPOFOL N/A 10/30/2022   Procedure: ESOPHAGOGASTRODUODENOSCOPY (EGD) WITH PROPOFOL;  Surgeon: Toney Reil, MD;  Location: [facility] ENDOSCOPY;  Service: Gastroenterology;  Laterality: N/A;   IR IMAGING GUIDED PORT INSERTION  11/13/2022   JOINT REPLACEMENT     REPLACEMENT TOTAL HIP W/  RESURFACING IMPLANTS Bilateral    SHOULDER SURGERY Bilateral    TOTAL KNEE ARTHROPLASTY Bilateral     HEMATOLOGY/ONCOLOGY HISTORY:  Oncology History  Malignant neoplasm of urinary bladder (HCC)  10/23/2013 Initial Diagnosis   Malignant neoplasm of urinary bladder (HCC)   11/03/2022 Cancer Staging   Staging  form: Urinary Bladder, AJCC 7th Edition - Clinical: Stage 0a (Ta, N0, M0) - Signed by Rickard Patience, MD on 11/03/2022   Esophageal adenocarcinoma (HCC)  10/20/2022 Imaging   CT abdomen pelvis w contrast  -Multiple bilobar hepatic lesions extending throughout most of the liver parenchyma and concerning for metastatic disease. Correlation with tissue sampling could be considered for definitive pathologic diagnosis.   - Circumferential wall thickening of the distal esophagus and gastroesophageal junction. Recommend further evaluation with endoscopy to exclude underlying malignancy. No other evidence of primary malignancy in the abdomen or pelvis although note is made that evaluation of the pelvic structures is very limited in this CT scan due to extensive artifact secondary to the patient's bilateral total hip arthroplasty.   - Cystic lesion in the pancreatic tail measuring up to 1.0 cm, possibly a sidebranch IPMN. No obvious pancreatic ductal dilatation. Further evaluation with MRI/MRCP could be considered.   - Extensive degenerative changes to the spine with compression deformities of T11, T12 and L2, similar to prior.    10/20/2022 Imaging   CT chest w contrast   New 4 mm nodule in the posterior segment of left lower lobe, 3 mm nodule in the left upper chest of pulmonary metastasis.   Stable mild centrilobular emphysema and mild peripheral reticulation which may reflect smoking-related lung fibrosis.   Mildly dilated esophagus with air-fluid level and circumferential thickening in the distal esophagus which may reflect chronic esophagitis. Consider endoscopic evaluation.    11/03/2022 Initial Diagnosis   Esophageal adenocarcinoma   + dysphagia for both liquid and solid  food, cough after eating. Unintentional weight loss 50 pounds over the past years, 14 pounds within last year.   10/30/22 EGD showed A large, submucosal mass with no bleeding and stigmata of recent bleeding was found in the lower third  of the esophagus, 35 to 40 cm from the incisors. The mass was partially obstructing and circumferential. Biopsies were taken with a cold forceps for histology.  Pathology showed moderately differentiated adenocarcinoma involving squamocolumnar junctional mucosa with focal intestinal metaplasia.   [facility] NGS showed ERBB2 copy number gain, TP53 stop gain, NSD1 frameshift, RARA copy number gain, TOP2A copy number gain. TMB 7.9 m/mb, MSI stable.    11/03/2022 Cancer Staging   Staging form: Esophagus - Adenocarcinoma, AJCC 8th Edition - Clinical stage from 11/03/2022: Stage IVB (cTX, cNX, cM1) - Signed by Rickard Patience, MD on 11/03/2022 Stage prefix: Initial diagnosis   11/13/2022 Imaging   PET scan showed 1. Tracer avid tumor is identified within the distal esophagus extending into the cardia. 2. Extensive tracer avid liver metastases. 3. Tiny nodule within the medial left lower lobe is too small to characterize by PET-CT measuring 4 mm. Attention to this nodule on future surveillance imaging is advised. 4. Asymmetric uptake localizing to the right first rib costosternal junction and left AC joint is noted. Findings are favored to represent arthropathic change 5. Gallstones. 6.  Aortic Atherosclerosis   11/13/2022 Procedure   Mediport placement by IR   11/15/2022 - 11/17/2022 Chemotherapy   Patient is on Treatment Plan : GASTROESOPHAGEAL FOLFOX q14d x 6 cycles     11/29/2022 -  Chemotherapy   Patient is on Treatment Plan : GASTROESOPHAGEAL Trastuzumab (6/4) D1 + FOLFOX D1 q14d x 12 cycles / Trastuzumab q14d       ALLERGIES:  is allergic to diltiazem, penicillins, and tizanidine.  MEDICATIONS:  Current Outpatient Medications  Medication Sig Dispense Refill   acetaminophen (TYLENOL) 500 MG tablet Take 500 mg by mouth every 8 (eight) hours as needed.     ALPRAZolam (XANAX) 0.5 MG tablet Take 1 tablet (0.5 mg total) by mouth See admin instructions. Take 1 tablet 30 minutes prior to imaging procedure,  may take another tablet if needed. 2 tablet 0   B Complex-C (VITAMIN B + C COMPLEX) TABS Take 1 capsule by mouth every morning.     carisoprodol (SOMA) 350 MG tablet Take 350 mg by mouth 2 (two) times daily.      Cetirizine HCl 10 MG CAPS Take 1 capsule by mouth every morning.     dexamethasone (DECADRON) 4 MG tablet Take 2 tablets (8 mg total) by mouth daily. Start the day after chemotherapy for 2 days. Take with food. 30 tablet 1   lidocaine-prilocaine (EMLA) cream Apply to affected area once 30 g 3   loperamide (IMODIUM) 2 MG capsule Take 1 capsule (2 mg total) by mouth See admin instructions. Initial: 4 mg,the 2 mg every 2 hours (4 mg every 4 hours at night)  maximum: 16 mg/day 60 capsule 2   Magnesium 500 MG TABS Take 500 mg by mouth as needed.     megestrol (MEGACE) 20 MG tablet Take 1 tablet (20 mg total) by mouth 2 (two) times daily. 60 tablet 0   melatonin (MELATONIN MAXIMUM STRENGTH) 5 MG TABS Take 5 mg by mouth at bedtime as needed.     naloxone (NARCAN) nasal spray 4 mg/0.1 mL Place 1 spray into the nose once for 1 dose. Spray half of bottle content into each nostril, then  call 911 2 each 0   omeprazole (PRILOSEC) 10 MG capsule Take 20 mg by mouth daily.     ondansetron (ZOFRAN) 8 MG tablet Take by mouth.     ondansetron (ZOFRAN) 8 MG tablet Take 1 tablet (8 mg total) by mouth every 8 (eight) hours as needed for nausea or vomiting. Start on the third day after chemotherapy. 30 tablet 1   Oxycodone HCl 10 MG TABS Take 1-2 tablets (10-20 mg total) by mouth every 4 (four) hours as needed (pain). 120 tablet 0   prazosin (MINIPRESS) 1 MG capsule Take 1 mg by mouth at bedtime. (Patient not taking: Reported on 11/22/2022)     prochlorperazine (COMPAZINE) 10 MG tablet Take 1 tablet (10 mg total) by mouth every 6 (six) hours as needed for nausea or vomiting. 30 tablet 1   rivaroxaban (XARELTO) 20 MG TABS tablet Take 20 mg by mouth.     rosuvastatin (CRESTOR) 20 MG tablet Take 20 mg by mouth  daily.     sertraline (ZOLOFT) 50 MG tablet Take by mouth.     No current facility-administered medications for this visit.   Facility-Administered Medications Ordered in Other Visits  Medication Dose Route Frequency Provider Last Rate Last Admin   dextrose 5 % solution   Intravenous Once Rickard Patience, MD       fluorouracil (ADRUCIL) 4,150 mg in sodium chloride 0.9 % 67 mL chemo infusion  2,400 mg/m2 (Treatment Plan Recorded) Intravenous 1 day or 1 dose Rickard Patience, MD       fluorouracil (ADRUCIL) chemo injection 700 mg  400 mg/m2 (Treatment Plan Recorded) Intravenous Once Rickard Patience, MD       leucovorin 692 mg in dextrose 5 % 250 mL infusion  400 mg/m2 (Treatment Plan Recorded) Intravenous Once Rickard Patience, MD       oxaliplatin (ELOXATIN) 120 mg in dextrose 5 % 500 mL chemo infusion  70 mg/m2 (Treatment Plan Recorded) Intravenous Once Rickard Patience, MD       trastuzumab-anns [facility]) 378 mg in sodium chloride 0.9 % 250 mL chemo infusion  6 mg/kg (Treatment Plan Recorded) Intravenous Once Rickard Patience, MD 178.7 mL/hr at 11/29/22 0949 378 mg at 11/29/22 0949    VITAL SIGNS: There were no vitals taken for this visit. There were no vitals filed for this visit.  Estimated body mass index is 20.45 kg/m as calculated from the following:   Height as of an earlier encounter on 11/29/22: 5\' 8"  (1.727 m).   Weight as of an earlier encounter on 11/29/22: 134 lb 8 oz (61 kg).  LABS: CBC:    Component Value Date/Time   WBC 7.9 11/29/2022 0802   HGB 11.6 (L) 11/29/2022 0802   HCT 34.0 (L) 11/29/2022 0802   PLT 200 11/29/2022 0802   MCV 95.5 11/29/2022 0802   NEUTROABS 4.7 11/29/2022 0802   LYMPHSABS 2.0 11/29/2022 0802   MONOABS 1.0 11/29/2022 0802   EOSABS 0.1 11/29/2022 0802   BASOSABS 0.0 11/29/2022 0802   Comprehensive Metabolic Panel:    Component Value Date/Time   NA 136 11/29/2022 0802   NA 141 02/10/2019 1408   K 3.8 11/29/2022 0802   CL 98 11/29/2022 0802   CO2 28 11/29/2022 0802   BUN 21  11/29/2022 0802   BUN 11 02/10/2019 1408   CREATININE 0.70 11/29/2022 0802   GLUCOSE 144 (H) 11/29/2022 0802   CALCIUM 9.2 11/29/2022 0802   AST 55 (H) 11/29/2022 0802   ALT 25 11/29/2022 0802  ALKPHOS 138 (H) 11/29/2022 0802   BILITOT 0.4 11/29/2022 0802   PROT 6.8 11/29/2022 0802   PROT 6.4 02/10/2019 1408   ALBUMIN 3.3 (L) 11/29/2022 0802   ALBUMIN 4.0 02/10/2019 1408    RADIOGRAPHIC STUDIES: NM Cardiac Muga Rest  Result Date: 11/16/2022 CLINICAL DATA:  baseline heart function for chemo eval EXAM: NUCLEAR MEDICINE CARDIAC BLOOD POOL IMAGING (MUGA) TECHNIQUE: Cardiac multi-gated acquisition was performed at rest following intravenous injection of Tc-82m labeled red blood cells. RADIOPHARMACEUTICALS:  21.1 mCi Tc-13m pertechnetate in-vitro labeled red blood cells IV COMPARISON:  PET-CT, 11/13/2022 CTA chest 10/20/2022 FINDINGS: LEFT ventricular wall normal in size and demonstrate normal motion. Ventricular cavities normal. Calculated LEFT ejection fraction is calculated at 58%. No prior comparison MPI or MUGA. IMPRESSION: 1. Normal LEFT ventricular wall motion. 2. Calculated LVEF 58% Electronically Signed   By: Roanna Banning M.D.   On: 11/16/2022 16:02   IR IMAGING GUIDED PORT INSERTION  Result Date: 11/13/2022 INDICATION: Esophageal adenocarcinoma. Patient presents for port catheter placement. EXAM: IMPLANTED PORT A CATH PLACEMENT WITH ULTRASOUND AND FLUOROSCOPIC GUIDANCE MEDICATIONS: None. ANESTHESIA/SEDATION: Versed 1 mg IV; Fentanyl 75 mcg IV; Moderate Sedation Time:  14 minutes The patient's vital signs and level of consciousness were continuously monitored during the procedure by the interventional radiology nurse under my direct supervision. FLUOROSCOPY: Radiation exposure index: 1 mGy reference air kerma COMPLICATIONS: None immediate. PROCEDURE: The right neck and chest was prepped with chlorhexidine, and draped in the usual sterile fashion using maximum barrier technique (cap and  mask, sterile gown, sterile gloves, large sterile sheet, hand hygiene and cutaneous antiseptic). Local anesthesia was attained by infiltration with 1% lidocaine with epinephrine. Ultrasound demonstrated patency of the right internal jugular vein, and this was documented with an image. Under real-time ultrasound guidance, this vein was accessed with a 21 gauge micropuncture needle and image documentation was performed. A small dermatotomy was made at the access site with an 11 scalpel. A 0.018" wire was advanced into the SVC and the access needle exchanged for a 87F micropuncture vascular sheath. The 0.018" wire was then removed and a 0.035" wire advanced into the IVC. An appropriate location for the subcutaneous reservoir was selected below the clavicle and an incision was made through the skin and underlying soft tissues. The subcutaneous tissues were then dissected using a combination of blunt and sharp surgical technique and a pocket was formed. A Bard ISP single lumen power injectable portacatheter was then tunneled through the subcutaneous tissues from the pocket to the dermatotomy and the port reservoir placed within the subcutaneous pocket. The venous access site was then serially dilated and a peel away vascular sheath placed over the wire. The wire was removed and the port catheter advanced into position under fluoroscopic guidance. The catheter tip is positioned in the superior cavoatrial junction. This was documented with a spot image. The portacatheter was then tested and found to flush and aspirate well. The port was flushed with saline followed by 100 units/mL heparinized saline. The pocket was then closed in two layers using first subdermal inverted interrupted absorbable sutures followed by a running subcuticular suture. The epidermis was then sealed with Dermabond. The dermatotomy at the venous access site was also closed with Dermabond. IMPRESSION: Successful placement of a right IJ approach Power  Port with ultrasound and fluoroscopic guidance. The catheter is ready for use. Electronically Signed   By: Malachy Moan M.D.   On: 11/13/2022 15:27   NM PET Image Initial (PI) Skull Base To  Thigh  Result Date: 11/13/2022 CLINICAL DATA:  Initial treatment strategy for esophageal carcinoma. EXAM: NUCLEAR MEDICINE PET SKULL BASE TO THIGH TECHNIQUE: 7.65 mCi F-18 FDG was injected intravenously. Full-ring PET imaging was performed from the skull base to thigh after the radiotracer. CT data was obtained and used for attenuation correction and anatomic localization. Fasting blood glucose: 93 mg/dl COMPARISON:  CT 91/47/8295 FINDINGS: Mediastinal blood pool activity: SUV max 1.39 Liver activity: SUV max NA NECK: No hypermetabolic lymph nodes in the neck. Incidental CT findings: None. CHEST: Tracer avid tumor within the distal esophagus has an SUV max 9.97 with corresponding mild circumferential wall thickening on the CT images. No tracer avid mediastinal or hilar lymph nodes. Tiny nodule within the medial left lower lobe is too small to characterize by PET-CT measuring 4 mm with SUV max 1.26, image 59/4. Incidental CT findings: Signs of chronic postinflammatory lung disease identified with bilateral peripheral interstitial reticulation. Aortic atherosclerosis with coronary artery calcifications. ABDOMEN/PELVIS: Extensive tracer avid liver metastases are identified throughout both lobes of the liver. Lesions are too numerous to count. Reference lesions include: -within segment 7/8 right hepatic lobe lesion measures 8.8 x 7.1 cm within SUV max of 30.72, image 72/4. -within segment 3 lesion measures 3.6 x 3.0 cm with SUV max of 25.08, image 88/4. -within segment 6 lesion measures 5.4 x 4.2 cm with SUV max 30.95, image 100/4. Tracer avid tumor extending from the distal esophagus into the cardia has an SUV max 14.45, image 75/4. No tracer avid abdominopelvic lymph nodes. Incidental CT findings: Aortic atherosclerosis.  Small gallstones identified. SKELETON: No focal hypermetabolic activity to suggest skeletal metastasis. Incidental CT findings: There is asymmetric increased uptake localizing to the right first rib costosternal junction with SUV max 5.78, image 48/4. Asymmetric uptake localizing to the left [facility] joint is also noted. Status post bilateral hip arthroplasty. IMPRESSION: 1. Tracer avid tumor is identified within the distal esophagus extending into the cardia. 2. Extensive tracer avid liver metastases. 3. Tiny nodule within the medial left lower lobe is too small to characterize by PET-CT measuring 4 mm. Attention to this nodule on future surveillance imaging is advised. 4. Asymmetric uptake localizing to the right first rib costosternal junction and left AC joint is noted. Findings are favored to represent arthropathic change 5. Gallstones. 6.  Aortic Atherosclerosis (ICD10-I70.0). Electronically Signed   By: Signa Kell M.D.   On: 11/13/2022 10:20   DG SWALLOW FUNC OP MEDICARE SPEECH PATH  Result Date: 11/09/2022 CLINICAL DATA:  Provided history: Cough, unspecified type. Dysphagia, unspecified type. EXAM: MODIFIED BARIUM SWALLOW TECHNIQUE: Different consistencies of barium were administered orally to the patient by the speech pathologist. Imaging of the pharynx was performed in the lateral projection. Mina Marble, PA-C (supervised by Dr. Jackey Loge) was present in the fluoroscopy room for the study, and operated the fluoroscopy equipment. FLUOROSCOPY: Radiation Exposure Index (as provided by the fluoroscopic device): 6.70 mGy Kerma COMPARISON:  None. FINDINGS: Different consistencies of barium were administered orally to the patient by the speech pathologist with fluoroscopic imaging of the pharynx from a lateral projection. Mina Marble, PA-C (supervised by Dr. Jackey Loge) was present in the fluoroscopy room and operated the fluoroscopy equipment. The speech pathologist observed aspiration with thin and  nectar consistencies. Please refer to the speech pathologist's report for full details. Bridging ventral cervical osteophyte noted at C4-C5. IMPRESSION: 1. Modified barium swallow as described. 2. Aspiration was observed with thin and nectar consistencies. Please refer to the speech pathologist's report  for complete details and recommendations. Electronically Signed   By: Jackey Loge D.O.   On: 11/09/2022 14:06    PERFORMANCE STATUS (ECOG) : 2 - Symptomatic, <50% confined to bed  Review of Systems Unless otherwise noted, a complete review of systems is negative.  Physical Exam General: NAD Pulmonary: Unlabored Extremities: no edema, no joint deformities Skin: no rashes Neurological: Weakness but otherwise nonfocal  IMPRESSION: Patient previously followed by pain clinic and has been on opioids for decades.  We are now managing his pain medications due to neoplasm related pain/cancer.  Patient was an add-on to my clinic schedule today at Dr. Bethanne Ginger request to assess pain.  Patient says that he is pain is overall significantly improved.  He says he feels better than he has in many years and has been able to have improved functioning and quality of life.  However, he is taking 1-2 oxycodone tablets every 4 hours around-the-clock to achieve improved pain.  Discussed starting patient on a long-acting opioid to try to reduce pill burden associated with as needed medications.  Patient and wife in agreement.  Will start patient on MS Contin 15 mg every 12 hours and refill oxycodone IR for breakthrough pain.  Recommend daily bowel regimen to prevent opioid-induced constipation.  PLAN: -Continue plan for workup/treatment -Start MS Contin 15 mg every 12 #60 -Continue oxycodone 10 to 20 mg every 4 hours as needed for breakthrough pain -Daily bowel regimen -Patient has naloxone at home -Follow-up telephone visit 3 to 4 weeks  Case and plan discussed with Dr. Cathie Hoops  Patient expressed understanding and  was in agreement with this plan. He also understands that He can call the clinic at any time with any questions, concerns, or complaints.     Time Total: 15 minutes  Visit consisted of counseling and education dealing with the complex and emotionally intense issues of symptom management and palliative care in the setting of serious and potentially life-threatening illness.Greater than 50%  of this time was spent counseling and coordinating care related to the above assessment and plan.  Signed by: Laurette Schimke, PhD, NP-C

## 2022-11-29 NOTE — Assessment & Plan Note (Signed)
Recommend imodium PRN as directed

## 2022-11-29 NOTE — Patient Instructions (Signed)
[facility]  Discharge Instructions: Thank you for choosing [facility] to provide your oncology and hematology care.  If you have a lab appointment with the Cancer Center, please go directly to the Cancer Center and check in at the registration area.  Wear comfortable clothing and clothing appropriate for easy access to any Portacath or PICC line.   We strive to give you quality time with your provider. You may need to reschedule your appointment if you arrive late (15 or more minutes).  Arriving late affects you and other patients whose appointments are after yours.  Also, if you miss three or more appointments without notifying the office, you may be dismissed from the clinic at the provider's discretion.      For prescription refill requests, have your pharmacy contact our office and allow 72 hours for refills to be completed.    Today you received the following chemotherapy and/or immunotherapy agents KANJINTI, OXALIPLATIN, LEUCOVORIN, 5 FU      To help prevent nausea and vomiting after your treatment, we encourage you to take your nausea medication as directed.  BELOW ARE SYMPTOMS THAT SHOULD BE REPORTED IMMEDIATELY: *FEVER GREATER THAN 100.4 F (38 C) OR HIGHER *CHILLS OR SWEATING *NAUSEA AND VOMITING THAT IS NOT CONTROLLED WITH YOUR NAUSEA MEDICATION *UNUSUAL SHORTNESS OF BREATH *UNUSUAL BRUISING OR BLEEDING *URINARY PROBLEMS (pain or burning when urinating, or frequent urination) *BOWEL PROBLEMS (unusual diarrhea, constipation, pain near the anus) TENDERNESS IN MOUTH AND THROAT WITH OR WITHOUT PRESENCE OF ULCERS (sore throat, sores in mouth, or a toothache) UNUSUAL RASH, SWELLING OR PAIN  UNUSUAL VAGINAL DISCHARGE OR ITCHING   Items with * indicate a potential emergency and should be followed up as soon as possible or go to the Emergency Department if any problems should occur.  Please show the CHEMOTHERAPY ALERT CARD or  IMMUNOTHERAPY ALERT CARD at check-in to the Emergency Department and triage nurse.  Should you have questions after your visit or need to cancel or reschedule your appointment, please contact [facility]  [phone] and follow the prompts.  Office hours are 8:00 a.m. to 4:30 p.m. Monday - Friday. Please note that voicemails left after 4:00 p.m. may not be returned until the following business day.  We are closed weekends and major holidays. You have access to a nurse at all times for urgent questions. Please call the main number to the clinic [phone] and follow the prompts.  For any non-urgent questions, you may also contact your provider using MyChart. We now offer e-Visits for anyone 12 and older to request care online for non-urgent symptoms. For details visit [email or web address].   Also download the MyChart app! Go to the app store, search "MyChart", open the app, select [place], and log in with your MyChart username and password.  Trastuzumab Injection What is this medication? TRASTUZUMAB (tras TOO zoo mab) treats breast cancer and stomach cancer. It works by blocking a protein that causes cancer cells to grow and multiply. This helps to slow or stop the spread of cancer cells. This medicine may be used for other purposes; ask your health care provider or pharmacist if you have questions. COMMON BRAND NAME(S): Herceptin, Marlowe Alt, Ontruzant, Trazimera What should I tell my care team before I take this medication? They need to know if you have any of these conditions: Heart failure Lung disease An unusual or allergic reaction to trastuzumab, other medications, foods, dyes, or preservatives  Pregnant or trying to get pregnant Breast-feeding How should I use this medication? This medication is injected into a vein. It is given by your care team in a hospital or clinic setting. Talk to your care team about the use of this  medication in children. It is not approved for use in children. Overdosage: If you think you have taken too much of this medicine contact a poison control center or emergency room at once. NOTE: This medicine is only for you. Do not share this medicine with others. What if I miss a dose? Keep appointments for follow-up doses. It is important not to miss your dose. Call your care team if you are unable to keep an appointment. What may interact with this medication? Certain types of chemotherapy, such as daunorubicin, doxorubicin, epirubicin, idarubicin This list may not describe all possible interactions. Give your health care provider a list of all the medicines, herbs, non-prescription drugs, or dietary supplements you use. Also tell them if you smoke, drink alcohol, or use illegal drugs. Some items may interact with your medicine. What should I watch for while using this medication? Your condition will be monitored carefully while you are receiving this medication. This medication may make you feel generally unwell. This is not uncommon, as chemotherapy affects healthy cells as well as cancer cells. Report any side effects. Continue your course of treatment even though you feel ill unless your care team tells you to stop. This medication may increase your risk of getting an infection. Call your care team for advice if you get a fever, chills, sore throat, or other symptoms of a cold or flu. Do not treat yourself. Try to avoid being around people who are sick. Avoid taking medications that contain aspirin, acetaminophen, ibuprofen, naproxen, or ketoprofen unless instructed by your care team. These medications can hide a fever. Talk to your care team if you may be pregnant. Serious birth defects can occur if you take this medication during pregnancy and for 7 months after the last dose. You will need a negative pregnancy test before starting this medication. Contraception is recommended while taking  this medication and for 7 months after the last dose. Your care team can help you find the option that works for you. Do not breastfeed while taking this medication and for 7 months after stopping treatment. What side effects may I notice from receiving this medication? Side effects that you should report to your care team as soon as possible: Allergic reactions or angioedema--skin rash, itching or hives, swelling of the face, eyes, lips, tongue, arms, or legs, trouble swallowing or breathing Dry cough, shortness of breath or trouble breathing Heart failure--shortness of breath, swelling of the ankles, feet, or hands, sudden weight gain, unusual weakness or fatigue Infection--fever, chills, cough, or sore throat Infusion reactions--chest pain, shortness of breath or trouble breathing, feeling faint or lightheaded Side effects that usually do not require medical attention (report to your care team if they continue or are bothersome): Diarrhea Dizziness Headache Nausea Trouble sleeping Vomiting This list may not describe all possible side effects. Call your doctor for medical advice about side effects. You may report side effects to FDA at 1-800-FDA-1088. Where should I keep my medication? This medication is given in a hospital or clinic. It will not be stored at home. NOTE: This sheet is a summary. It may not cover all possible information. If you have questions about this medicine, talk to your doctor, pharmacist, or health care provider.  2024 Elsevier/Gold  Standard (2021-09-27 00:00:00)  Oxaliplatin Injection What is this medication? OXALIPLATIN (ox AL i PLA tin) treats colorectal cancer. It works by slowing down the growth of cancer cells. This medicine may be used for other purposes; ask your health care provider or pharmacist if you have questions. COMMON BRAND NAME(S): Eloxatin What should I tell my care team before I take this medication? They need to know if you have any of these  conditions: Heart disease History of irregular heartbeat or rhythm Liver disease Low blood cell levels (white cells, red cells, and platelets) Lung or breathing disease, such as asthma Take medications that treat or prevent blood clots Tingling of the fingers, toes, or other nerve disorder An unusual or allergic reaction to oxaliplatin, other medications, foods, dyes, or preservatives If you or your partner are pregnant or trying to get pregnant Breast-feeding How should I use this medication? This medication is injected into a vein. It is given by your care team in a hospital or clinic setting. Talk to your care team about the use of this medication in children. Special care may be needed. Overdosage: If you think you have taken too much of this medicine contact a poison control center or emergency room at once. NOTE: This medicine is only for you. Do not share this medicine with others. What if I miss a dose? Keep appointments for follow-up doses. It is important not to miss a dose. Call your care team if you are unable to keep an appointment. What may interact with this medication? Do not take this medication with any of the following: Cisapride Dronedarone Pimozide Thioridazine This medication may also interact with the following: Aspirin and aspirin-like medications Certain medications that treat or prevent blood clots, such as warfarin, apixaban, dabigatran, and rivaroxaban Cisplatin Cyclosporine Diuretics Medications for infection, such as acyclovir, adefovir, amphotericin B, bacitracin, cidofovir, foscarnet, ganciclovir, gentamicin, pentamidine, vancomycin NSAIDs, medications for pain and inflammation, such as ibuprofen or naproxen Other medications that cause heart rhythm changes Pamidronate Zoledronic acid This list may not describe all possible interactions. Give your health care provider a list of all the medicines, herbs, non-prescription drugs, or dietary supplements  you use. Also tell them if you smoke, drink alcohol, or use illegal drugs. Some items may interact with your medicine. What should I watch for while using this medication? Your condition will be monitored carefully while you are receiving this medication. You may need blood work while taking this medication. This medication may make you feel generally unwell. This is not uncommon as chemotherapy can affect healthy cells as well as cancer cells. Report any side effects. Continue your course of treatment even though you feel ill unless your care team tells you to stop. This medication may increase your risk of getting an infection. Call your care team for advice if you get a fever, chills, sore throat, or other symptoms of a cold or flu. Do not treat yourself. Try to avoid being around people who are sick. Avoid taking medications that contain aspirin, acetaminophen, ibuprofen, naproxen, or ketoprofen unless instructed by your care team. These medications may hide a fever. Be careful brushing or flossing your teeth or using a toothpick because you may get an infection or bleed more easily. If you have any dental work done, tell your dentist you are receiving this medication. This medication can make you more sensitive to cold. Do not drink cold drinks or use ice. Cover exposed skin before coming in contact with cold temperatures or cold objects.  When out in cold weather wear warm clothing and cover your mouth and nose to warm the air that goes into your lungs. Tell your care team if you get sensitive to the cold. Talk to your care team if you or your partner are pregnant or think either of you might be pregnant. This medication can cause serious birth defects if taken during pregnancy and for 9 months after the last dose. A negative pregnancy test is required before starting this medication. A reliable form of contraception is recommended while taking this medication and for 9 months after the last dose. Talk  to your care team about effective forms of contraception. Do not father a child while taking this medication and for 6 months after the last dose. Use a condom while having sex during this time period. Do not breastfeed while taking this medication and for 3 months after the last dose. This medication may cause infertility. Talk to your care team if you are concerned about your fertility. What side effects may I notice from receiving this medication? Side effects that you should report to your care team as soon as possible: Allergic reactions--skin rash, itching, hives, swelling of the face, lips, tongue, or throat Bleeding--bloody or black, tar-like stools, vomiting blood or brown material that looks like coffee grounds, red or dark brown urine, small red or purple spots on skin, unusual bruising or bleeding Dry cough, shortness of breath or trouble breathing Heart rhythm changes--fast or irregular heartbeat, dizziness, feeling faint or lightheaded, chest pain, trouble breathing Infection--fever, chills, cough, sore throat, wounds that don't heal, pain or trouble when passing urine, general feeling of discomfort or being unwell Liver injury--right upper belly pain, loss of appetite, nausea, light-colored stool, dark yellow or brown urine, yellowing skin or eyes, unusual weakness or fatigue Low red blood cell level--unusual weakness or fatigue, dizziness, headache, trouble breathing Muscle injury--unusual weakness or fatigue, muscle pain, dark yellow or brown urine, decrease in amount of urine Pain, tingling, or numbness in the hands or feet Sudden and severe headache, confusion, change in vision, seizures, which may be signs of posterior reversible encephalopathy syndrome (PRES) Unusual bruising or bleeding Side effects that usually do not require medical attention (report to your care team if they continue or are bothersome): Diarrhea Nausea Pain, redness, or swelling with sores inside the mouth  or throat Unusual weakness or fatigue Vomiting This list may not describe all possible side effects. Call your doctor for medical advice about side effects. You may report side effects to FDA at 1-800-FDA-1088. Where should I keep my medication? This medication is given in a hospital or clinic. It will not be stored at home. NOTE: This sheet is a summary. It may not cover all possible information. If you have questions about this medicine, talk to your doctor, pharmacist, or health care provider.  2024 Elsevier/Gold Standard (2022-07-23 00:00:00)  Leucovorin Injection What is this medication? LEUCOVORIN (loo koe VOR in) prevents side effects from certain medications, such as methotrexate. It works by increasing folate levels. This helps protect healthy cells in your body. It may also be used to treat anemia caused by low levels of folate. It can also be used with fluorouracil, a type of chemotherapy, to treat colorectal cancer. It works by increasing the effects of fluorouracil in the body. This medicine may be used for other purposes; ask your health care provider or pharmacist if you have questions. What should I tell my care team before I take this medication? They  need to know if you have any of these conditions: Anemia from low levels of vitamin B12 in the blood An unusual or allergic reaction to leucovorin, folic acid, other medications, foods, dyes, or preservatives Pregnant or trying to get pregnant Breastfeeding How should I use this medication? This medication is injected into a vein or a muscle. It is given by your care team in a hospital or clinic setting. Talk to your care team about the use of this medication in children. Special care may be needed. Overdosage: If you think you have taken too much of this medicine contact a poison control center or emergency room at once. NOTE: This medicine is only for you. Do not share this medicine with others. What if I miss a dose? Keep  appointments for follow-up doses. It is important not to miss your dose. Call your care team if you are unable to keep an appointment. What may interact with this medication? Capecitabine Fluorouracil Phenobarbital Phenytoin Primidone Trimethoprim;sulfamethoxazole This list may not describe all possible interactions. Give your health care provider a list of all the medicines, herbs, non-prescription drugs, or dietary supplements you use. Also tell them if you smoke, drink alcohol, or use illegal drugs. Some items may interact with your medicine. What should I watch for while using this medication? Your condition will be monitored carefully while you are receiving this medication. This medication may increase the side effects of 5-fluorouracil. Tell your care team if you have diarrhea or mouth sores that do not get better or that get worse. What side effects may I notice from receiving this medication? Side effects that you should report to your care team as soon as possible: Allergic reactions--skin rash, itching, hives, swelling of the face, lips, tongue, or throat This list may not describe all possible side effects. Call your doctor for medical advice about side effects. You may report side effects to FDA at 1-800-FDA-1088. Where should I keep my medication? This medication is given in a hospital or clinic. It will not be stored at home. NOTE: This sheet is a summary. It may not cover all possible information. If you have questions about this medicine, talk to your doctor, pharmacist, or health care provider.  2024 Elsevier/Gold Standard (2021-10-18 00:00:00)   Fluorouracil Injection What is this medication? FLUOROURACIL (flure oh YOOR a sil) treats some types of cancer. It works by slowing down the growth of cancer cells. This medicine may be used for other purposes; ask your health care provider or pharmacist if you have questions. COMMON BRAND NAME(S): Adrucil What should I tell my  care team before I take this medication? They need to know if you have any of these conditions: Blood disorders Dihydropyrimidine dehydrogenase (DPD) deficiency Infection, such as chickenpox, cold sores, herpes Kidney disease Liver disease Poor nutrition Recent or ongoing radiation therapy An unusual or allergic reaction to fluorouracil, other medications, foods, dyes, or preservatives If you or your partner are pregnant or trying to get pregnant Breast-feeding How should I use this medication? This medication is injected into a vein. It is administered by your care team in a hospital or clinic setting. Talk to your care team about the use of this medication in children. Special care may be needed. Overdosage: If you think you have taken too much of this medicine contact a poison control center or emergency room at once. NOTE: This medicine is only for you. Do not share this medicine with others. What if I miss a dose? Keep appointments  for follow-up doses. It is important not to miss your dose. Call your care team if you are unable to keep an appointment. What may interact with this medication? Do not take this medication with any of the following: Live virus vaccines This medication may also interact with the following: Medications that treat or prevent blood clots, such as warfarin, enoxaparin, dalteparin This list may not describe all possible interactions. Give your health care provider a list of all the medicines, herbs, non-prescription drugs, or dietary supplements you use. Also tell them if you smoke, drink alcohol, or use illegal drugs. Some items may interact with your medicine. What should I watch for while using this medication? Your condition will be monitored carefully while you are receiving this medication. This medication may make you feel generally unwell. This is not uncommon as chemotherapy can affect healthy cells as well as cancer cells. Report any side effects.  Continue your course of treatment even though you feel ill unless your care team tells you to stop. In some cases, you may be given additional medications to help with side effects. Follow all directions for their use. This medication may increase your risk of getting an infection. Call your care team for advice if you get a fever, chills, sore throat, or other symptoms of a cold or flu. Do not treat yourself. Try to avoid being around people who are sick. This medication may increase your risk to bruise or bleed. Call your care team if you notice any unusual bleeding. Be careful brushing or flossing your teeth or using a toothpick because you may get an infection or bleed more easily. If you have any dental work done, tell your dentist you are receiving this medication. Avoid taking medications that contain aspirin, acetaminophen, ibuprofen, naproxen, or ketoprofen unless instructed by your care team. These medications may hide a fever. Do not treat diarrhea with over the counter products. Contact your care team if you have diarrhea that lasts more than 2 days or if it is severe and watery. This medication can make you more sensitive to the sun. Keep out of the sun. If you cannot avoid being in the sun, wear protective clothing and sunscreen. Do not use sun lamps, tanning beds, or tanning booths. Talk to your care team if you or your partner wish to become pregnant or think you might be pregnant. This medication can cause serious birth defects if taken during pregnancy and for 3 months after the last dose. A reliable form of contraception is recommended while taking this medication and for 3 months after the last dose. Talk to your care team about effective forms of contraception. Do not father a child while taking this medication and for 3 months after the last dose. Use a condom while having sex during this time period. Do not breastfeed while taking this medication. This medication may cause  infertility. Talk to your care team if you are concerned about your fertility. What side effects may I notice from receiving this medication? Side effects that you should report to your care team as soon as possible: Allergic reactions--skin rash, itching, hives, swelling of the face, lips, tongue, or throat Heart attack--pain or tightness in the chest, shoulders, arms, or jaw, nausea, shortness of breath, cold or clammy skin, feeling faint or lightheaded Heart failure--shortness of breath, swelling of the ankles, feet, or hands, sudden weight gain, unusual weakness or fatigue Heart rhythm changes--fast or irregular heartbeat, dizziness, feeling faint or lightheaded, chest pain, trouble breathing  High ammonia level--unusual weakness or fatigue, confusion, loss of appetite, nausea, vomiting, seizures Infection--fever, chills, cough, sore throat, wounds that don't heal, pain or trouble when passing urine, general feeling of discomfort or being unwell Low red blood cell level--unusual weakness or fatigue, dizziness, headache, trouble breathing Pain, tingling, or numbness in the hands or feet, muscle weakness, change in vision, confusion or trouble speaking, loss of balance or coordination, trouble walking, seizures Redness, swelling, and blistering of the skin over hands and feet Severe or prolonged diarrhea Unusual bruising or bleeding Side effects that usually do not require medical attention (report to your care team if they continue or are bothersome): Dry skin Headache Increased tears Nausea Pain, redness, or swelling with sores inside the mouth or throat Sensitivity to light Vomiting This list may not describe all possible side effects. Call your doctor for medical advice about side effects. You may report side effects to FDA at 1-800-FDA-1088. Where should I keep my medication? This medication is given in a hospital or clinic. It will not be stored at home. NOTE: This sheet is a summary.  It may not cover all possible information. If you have questions about this medicine, talk to your doctor, pharmacist, or health care provider.  2024 Elsevier/Gold Standard (2021-09-20 00:00:00)

## 2022-11-29 NOTE — Assessment & Plan Note (Signed)
On Xarelto for anticoagulation.   

## 2022-11-29 NOTE — Assessment & Plan Note (Signed)
Image findings and pathology results are reviewed with patient and wife.  PET scan results were reviewed and discussed with patient.  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  Labs are reviewed and discussed with patient. S/p 1 cycle of  FOLFOX.  He tolerates well.  NGS results come back today positive for HER2 copy again. Baseline MUGA LVEF 58% Proceed with FOLFOX and Ttranstuzumab. Rationale and side effects were reviewed with patient.

## 2022-11-29 NOTE — Progress Notes (Signed)
Hematology/Oncology Progress note [phone] [phone]        REFERRING PROVIDER: Rickard Patience, MD    CHIEF COMPLAINTS/PURPOSE OF CONSULTATION:  Esophageal cancer.   ASSESSMENT & PLAN:   Cancer Staging  Esophageal adenocarcinoma [facility]) Staging form: Esophagus - Adenocarcinoma, AJCC 8th Edition - Clinical stage from 11/03/2022: Stage IVB (cTX, cNX, cM1) - Signed by Rickard Patience, MD on 11/03/2022  Malignant neoplasm of urinary bladder [facility]) Staging form: Urinary Bladder, AJCC 7th Edition - Clinical: Stage 0a (Ta, N0, M0) - Signed by Rickard Patience, MD on 11/03/2022   Esophageal adenocarcinoma [facility]) Image findings and pathology results are reviewed with patient and wife.  PET scan results were reviewed and discussed with patient.  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  Labs are reviewed and discussed with patient. S/p 1 cycle of  FOLFOX.  He tolerates well.  NGS results come back today positive for HER2 copy again. Baseline MUGA LVEF 58% Proceed with FOLFOX and Ttranstuzumab. Rationale and side effects were reviewed with patient.     Neoplasm related pain Continue follow-up with palliative care service. continue oxycodone 10-20mg  Q 4-6 hours PRN.  Add long acting agent.    Atrial fibrillation and flutter (HCC) On Xarelto for anticoagulation.    Protein calorie malnutrition (HCC) Follow-up with nutritionist Continue nutrition supplements.  Recommend him to hold off Statin.  Add Megace 20mg  Bid.    Chemotherapy induced diarrhea Recommend imodium PRN as directed   Dysuria Chronic dysuria and urinary urgency.  He does not want to take Flomax due to side effect.  Check UA urine culture   Orders Placed This Encounter  Procedures   Urine Culture    Standing Status:   Future    Number of Occurrences:   1    Standing Expiration Date:   11/29/2023   Urinalysis, Complete w Microscopic   Urinalysis, Complete w  Microscopic    Standing Status:   Future    Number of Occurrences:   1    Standing Expiration Date:   11/29/2023     Follow-up in 2 weeks for lab MD FOLFOX and trastuzumab. All questions were answered. The patient knows to call the clinic with any problems, questions or concerns.  Rickard Patience, MD, PhD [facility] Hematology Oncology 11/29/2022    HISTORY OF PRESENTING ILLNESS:  Aaron Short 77 y.o. male presents to establish care for esophageal adenocarcinoma.  I have reviewed his chart and materials related to his cancer extensively and collaborated history with the patient. Summary of oncologic history is as follows: Oncology History  Malignant neoplasm of urinary bladder (HCC)  10/23/2013 Initial Diagnosis   Malignant neoplasm of urinary bladder (HCC)   11/03/2022 Cancer Staging   Staging form: Urinary Bladder, AJCC 7th Edition - Clinical: Stage 0a (Ta, N0, M0) - Signed by Rickard Patience, MD on 11/03/2022   Esophageal adenocarcinoma (HCC)  10/20/2022 Imaging   CT abdomen pelvis w contrast  -Multiple bilobar hepatic lesions extending throughout most of the liver parenchyma and concerning for metastatic disease. Correlation with tissue sampling could be considered for definitive pathologic diagnosis.   - Circumferential wall thickening of the distal esophagus and gastroesophageal junction. Recommend further evaluation with endoscopy to exclude underlying malignancy. No other evidence of primary malignancy in the abdomen or pelvis although note is made that evaluation of the pelvic structures is very limited in this CT scan due to extensive artifact secondary to the patient's bilateral total hip arthroplasty.   -  Cystic lesion in the pancreatic tail measuring up to 1.0 cm, possibly a sidebranch IPMN. No obvious pancreatic ductal dilatation. Further evaluation with MRI/MRCP could be considered.   - Extensive degenerative changes to the spine with compression deformities of T11, T12 and L2,  similar to prior.    10/20/2022 Imaging   CT chest w contrast   New 4 mm nodule in the posterior segment of left lower lobe, 3 mm nodule in the left upper chest of pulmonary metastasis.   Stable mild centrilobular emphysema and mild peripheral reticulation which may reflect smoking-related lung fibrosis.   Mildly dilated esophagus with air-fluid level and circumferential thickening in the distal esophagus which may reflect chronic esophagitis. Consider endoscopic evaluation.    11/03/2022 Initial Diagnosis   Esophageal adenocarcinoma   + dysphagia for both liquid and solid food, cough after eating. Unintentional weight loss 50 pounds over the past years, 14 pounds within last year.   10/30/22 EGD showed A large, submucosal mass with no bleeding and stigmata of recent bleeding was found in the lower third of the esophagus, 35 to 40 cm from the incisors. The mass was partially obstructing and circumferential. Biopsies were taken with a cold forceps for histology.  Pathology showed moderately differentiated adenocarcinoma involving squamocolumnar junctional mucosa with focal intestinal metaplasia.   [facility] NGS showed ERBB2 copy number gain, TP53 stop gain, NSD1 frameshift, RARA copy number gain, TOP2A copy number gain. TMB 7.9 m/mb, MSI stable.    11/03/2022 Cancer Staging   Staging form: Esophagus - Adenocarcinoma, AJCC 8th Edition - Clinical stage from 11/03/2022: Stage IVB (cTX, cNX, cM1) - Signed by Rickard Patience, MD on 11/03/2022 Stage prefix: Initial diagnosis   11/13/2022 Imaging   PET scan showed 1. Tracer avid tumor is identified within the distal esophagus extending into the cardia. 2. Extensive tracer avid liver metastases. 3. Tiny nodule within the medial left lower lobe is too small to characterize by PET-CT measuring 4 mm. Attention to this nodule on future surveillance imaging is advised. 4. Asymmetric uptake localizing to the right first rib costosternal junction and left AC joint is  noted. Findings are favored to represent arthropathic change 5. Gallstones. 6.  Aortic Atherosclerosis   11/13/2022 Procedure   Mediport placement by IR   11/15/2022 - 11/17/2022 Chemotherapy   Patient is on Treatment Plan : GASTROESOPHAGEAL FOLFOX q14d x 6 cycles     11/29/2022 -  Chemotherapy   Patient is on Treatment Plan : GASTROESOPHAGEAL Trastuzumab (6/4) D1 + FOLFOX D1 q14d x 12 cycles / Trastuzumab q14d      He is a current every day smoker. He denies abdominal pain, nausea vomiting. Gait is not steady, frequent falls, this is a chronic issue.  Aifb on Xarelto. He currently is able to eat soft moist food.  + chronic neck and back pain, joint pain, he takes oxycodone 10-20mg  every 4-6 hours. Pain is controlled better.  Appetite is slightly better, lost 1-2 pounds since last visit.  + Dysuria for about 6 weeks. Chronically increased urinary urgency   MEDICAL HISTORY:  Past Medical History:  Diagnosis Date   Arthralgia of lower leg 04/02/2012   Arthralgia of upper arm 06/18/2009   Cancer (HCC)    Diabetes mellitus without complication (HCC)    FH: atrial fibrillation    Hypertension     SURGICAL HISTORY: Past Surgical History:  Procedure Laterality Date   bladder cancer surgery  2015   ESOPHAGOGASTRODUODENOSCOPY (EGD) WITH PROPOFOL N/A 10/30/2022   Procedure: ESOPHAGOGASTRODUODENOSCOPY (  EGD) WITH PROPOFOL;  Surgeon: Toney Reil, MD;  Location[facility] ENDOSCOPY;  Service: Gastroenterology;  Laterality: N/A;   IR IMAGING GUIDED PORT INSERTION  11/13/2022   JOINT REPLACEMENT     REPLACEMENT TOTAL HIP W/  RESURFACING IMPLANTS Bilateral    SHOULDER SURGERY Bilateral    TOTAL KNEE ARTHROPLASTY Bilateral     SOCIAL HISTORY: Social History   Socioeconomic History   Marital status: Married    Spouse name: Not on file   Number of children: Not on file   Years of education: Not on file   Highest education level: Not on file  Occupational History   Not on file  Tobacco  Use   Smoking status: Every Day    Packs/day: .5    Types: Cigarettes   Smokeless tobacco: Never  Vaping Use   Vaping Use: Never used  Substance and Sexual Activity   Alcohol use: No    Alcohol/week: 0.0 standard drinks of alcohol   Drug use: No   Sexual activity: Not on file  Other Topics Concern   Not on file  Social History Narrative   Lives with wife, [name].    Social Determinants of Health   Financial Resource Strain: Low Risk  (11/03/2022)   Overall Financial Resource Strain (CARDIA)    Difficulty of Paying Living Expenses: Not very hard  Food Insecurity: No Food Insecurity (11/03/2022)   Hunger Vital Sign    Worried About Running Out of Food in the Last Year: Never true    Ran Out of Food in the Last Year: Never true  Transportation Needs: No Transportation Needs (11/03/2022)   PRAPARE - Administrator, Civil Service (Medical): No    Lack of Transportation (Non-Medical): No  Physical Activity: Not on file  Stress: No Stress Concern Present (11/03/2022)   [employer] of Occupational Health - Occupational Stress Questionnaire    Feeling of Stress : Only a little  Social Connections: Not on file  Intimate Partner Violence: Not At Risk (11/03/2022)   Humiliation, Afraid, Rape, and Kick questionnaire    Fear of Current or Ex-Partner: No    Emotionally Abused: No    Physically Abused: No    Sexually Abused: No    FAMILY HISTORY: Family History  Problem Relation Age of Onset   Dementia Mother    Heart disease Father     ALLERGIES:  is allergic to diltiazem, penicillins, and tizanidine.  MEDICATIONS:  Current Outpatient Medications  Medication Sig Dispense Refill   megestrol (MEGACE) 20 MG tablet Take 1 tablet (20 mg total) by mouth 2 (two) times daily. 60 tablet 0   acetaminophen (TYLENOL) 500 MG tablet Take 500 mg by mouth every 8 (eight) hours as needed.     ALPRAZolam (XANAX) 0.5 MG tablet Take 1 tablet (0.5 mg total) by mouth See admin instructions.  Take 1 tablet 30 minutes prior to imaging procedure, may take another tablet if needed. 2 tablet 0   B Complex-C (VITAMIN B + C COMPLEX) TABS Take 1 capsule by mouth every morning.     carisoprodol (SOMA) 350 MG tablet Take 350 mg by mouth 2 (two) times daily.      Cetirizine HCl 10 MG CAPS Take 1 capsule by mouth every morning.     dexamethasone (DECADRON) 4 MG tablet Take 2 tablets (8 mg total) by mouth daily. Start the day after chemotherapy for 2 days. Take with food. 30 tablet 1   lidocaine-prilocaine (EMLA) cream Apply to  affected area once 30 g 3   loperamide (IMODIUM) 2 MG capsule Take 1 capsule (2 mg total) by mouth See admin instructions. Initial: 4 mg,the 2 mg every 2 hours (4 mg every 4 hours at night)  maximum: 16 mg/day 60 capsule 2   Magnesium 500 MG TABS Take 500 mg by mouth as needed.     melatonin (MELATONIN MAXIMUM STRENGTH) 5 MG TABS Take 5 mg by mouth at bedtime as needed.     morphine (MS CONTIN) 15 MG 12 hr tablet Take 1 tablet (15 mg total) by mouth every 12 (twelve) hours. 60 tablet 0   naloxone (NARCAN) nasal spray 4 mg/0.1 mL Place 1 spray into the nose once for 1 dose. Spray half of bottle content into each nostril, then call 911 2 each 0   omeprazole (PRILOSEC) 10 MG capsule Take 20 mg by mouth daily.     ondansetron (ZOFRAN) 8 MG tablet Take by mouth.     ondansetron (ZOFRAN) 8 MG tablet Take 1 tablet (8 mg total) by mouth every 8 (eight) hours as needed for nausea or vomiting. Start on the third day after chemotherapy. 30 tablet 1   Oxycodone HCl 10 MG TABS Take 1-2 tablets (10-20 mg total) by mouth every 4 (four) hours as needed (pain). 120 tablet 0   prazosin (MINIPRESS) 1 MG capsule Take 1 mg by mouth at bedtime. (Patient not taking: Reported on 11/22/2022)     prochlorperazine (COMPAZINE) 10 MG tablet Take 1 tablet (10 mg total) by mouth every 6 (six) hours as needed for nausea or vomiting. 30 tablet 1   rivaroxaban (XARELTO) 20 MG TABS tablet Take 20 mg by mouth.      rosuvastatin (CRESTOR) 20 MG tablet Take 20 mg by mouth daily.     sertraline (ZOLOFT) 50 MG tablet Take by mouth.     No current facility-administered medications for this visit.   Facility-Administered Medications Ordered in Other Visits  Medication Dose Route Frequency Provider Last Rate Last Admin   fluorouracil (ADRUCIL) 4,150 mg in sodium chloride 0.9 % 67 mL chemo infusion  2,400 mg/m2 (Treatment Plan Recorded) Intravenous 1 day or 1 dose Rickard Patience, MD       fluorouracil (ADRUCIL) chemo injection 700 mg  400 mg/m2 (Treatment Plan Recorded) Intravenous Once Rickard Patience, MD       leucovorin 692 mg in dextrose 5 % 250 mL infusion  400 mg/m2 (Treatment Plan Recorded) Intravenous Once Rickard Patience, MD 142 mL/hr at 11/29/22 1149 692 mg at 11/29/22 1149   oxaliplatin (ELOXATIN) 120 mg in dextrose 5 % 500 mL chemo infusion  70 mg/m2 (Treatment Plan Recorded) Intravenous Once Rickard Patience, MD 262 mL/hr at 11/29/22 1151 120 mg at 11/29/22 1151    Review of Systems  Constitutional:  Positive for appetite change, fatigue and unexpected weight change. Negative for chills and fever.  HENT:   Negative for hearing loss and voice change.   Eyes:  Negative for eye problems and icterus.  Respiratory:  Negative for chest tightness, cough and shortness of breath.   Cardiovascular:  Negative for chest pain and leg swelling.  Gastrointestinal:  Negative for abdominal distention and abdominal pain.       Heart burn  Endocrine: Negative for hot flashes.  Genitourinary:  Negative for difficulty urinating, dysuria and frequency.   Musculoskeletal:  Positive for arthralgias, back pain, gait problem and neck pain.  Skin:  Negative for itching and rash.  Neurological:  Positive for gait  problem. Negative for light-headedness and numbness.       Falls.   Hematological:  Negative for adenopathy. Does not bruise/bleed easily.  Psychiatric/Behavioral:  Negative for confusion.      PHYSICAL EXAMINATION: ECOG PERFORMANCE  STATUS: 1 - Symptomatic but completely ambulatory  Vitals:   11/29/22 0816  BP: (!) 106/56  Pulse: 97  Temp: (!) 96.8 F (36 C)  SpO2: 96%   Filed Weights   11/29/22 0816  Weight: 134 lb 8 oz (61 kg)    Physical Exam Constitutional:      General: He is not in acute distress.    Appearance: He is ill-appearing. He is not diaphoretic.  HENT:     Head: Normocephalic and atraumatic.  Eyes:     General: No scleral icterus.    Pupils: Pupils are equal, round, and reactive to light.  Cardiovascular:     Rate and Rhythm: Normal rate and regular rhythm.     Heart sounds: No murmur heard. Pulmonary:     Effort: Pulmonary effort is normal. No respiratory distress.     Breath sounds: Normal breath sounds. No wheezing.  Abdominal:     General: There is no distension.     Palpations: Abdomen is soft.     Tenderness: There is no abdominal tenderness.  Musculoskeletal:        General: Normal range of motion.     Cervical back: Normal range of motion and neck supple.  Skin:    General: Skin is warm and dry.     Findings: No erythema.  Neurological:     Mental Status: He is alert and oriented to person, place, and time. Mental status is at baseline.     Cranial Nerves: No cranial nerve deficit.     Motor: No abnormal muscle tone.  Psychiatric:        Mood and Affect: Mood and affect normal.      LABORATORY DATA:  I have reviewed the data as listed    Latest Ref Rng & Units 11/29/2022    8:02 AM 11/22/2022    7:55 AM 11/15/2022    7:58 AM  CBC  WBC 4.0 - 10.5 K/uL 7.9  6.3  8.4   Hemoglobin 13.0 - 17.0 g/dL 69.6  29.5  28.4   Hematocrit 39.0 - 52.0 % 34.0  35.7  35.3   Platelets 150 - 400 K/uL 200  242  289       Latest Ref Rng & Units 11/29/2022    8:02 AM 11/22/2022    7:55 AM 11/15/2022    7:58 AM  CMP  Glucose 70 - 99 mg/dL 132  440  102   BUN 8 - 23 mg/dL 21  22  22    Creatinine 0.61 - 1.24 mg/dL 7.25  3.66  4.40   Sodium 135 - 145 mmol/L 136  135  137   Potassium  3.5 - 5.1 mmol/L 3.8  4.0  4.2   Chloride 98 - 111 mmol/L 98  97  98   CO2 22 - 32 mmol/L 28  28  29    Calcium 8.9 - 10.3 mg/dL 9.2  9.0  9.1   Total Protein 6.5 - 8.1 g/dL 6.8  6.7  7.0   Total Bilirubin 0.3 - 1.2 mg/dL 0.4  0.5  0.7   Alkaline Phos 38 - 126 U/L 138  153  177   AST 15 - 41 U/L 55  68  71   ALT 0 - 44  U/L 25  29  28       RADIOGRAPHIC STUDIES: I have personally reviewed the radiological images as listed and agreed with the findings in the report. NM Cardiac Muga Rest  Result Date: 11/16/2022 CLINICAL DATA:  baseline heart function for chemo eval EXAM: NUCLEAR MEDICINE CARDIAC BLOOD POOL IMAGING (MUGA) TECHNIQUE: Cardiac multi-gated acquisition was performed at rest following intravenous injection of Tc-71m labeled red blood cells. RADIOPHARMACEUTICALS:  21.1 mCi Tc-56m pertechnetate in-vitro labeled red blood cells IV COMPARISON:  PET-CT, 11/13/2022 CTA chest 10/20/2022 FINDINGS: LEFT ventricular wall normal in size and demonstrate normal motion. Ventricular cavities normal. Calculated LEFT ejection fraction is calculated at 58%. No prior comparison MPI or MUGA. IMPRESSION: 1. Normal LEFT ventricular wall motion. 2. Calculated LVEF 58% Electronically Signed   By: Roanna Banning M.D.   On: 11/16/2022 16:02   IR IMAGING GUIDED PORT INSERTION  Result Date: 11/13/2022 INDICATION: Esophageal adenocarcinoma. Patient presents for port catheter placement. EXAM: IMPLANTED PORT A CATH PLACEMENT WITH ULTRASOUND AND FLUOROSCOPIC GUIDANCE MEDICATIONS: None. ANESTHESIA/SEDATION: Versed 1 mg IV; Fentanyl 75 mcg IV; Moderate Sedation Time:  14 minutes The patient's vital signs and level of consciousness were continuously monitored during the procedure by the interventional radiology nurse under my direct supervision. FLUOROSCOPY: Radiation exposure index: 1 mGy reference air kerma COMPLICATIONS: None immediate. PROCEDURE: The right neck and chest was prepped with chlorhexidine, and draped in the  usual sterile fashion using maximum barrier technique (cap and mask, sterile gown, sterile gloves, large sterile sheet, hand hygiene and cutaneous antiseptic). Local anesthesia was attained by infiltration with 1% lidocaine with epinephrine. Ultrasound demonstrated patency of the right internal jugular vein, and this was documented with an image. Under real-time ultrasound guidance, this vein was accessed with a 21 gauge micropuncture needle and image documentation was performed. A small dermatotomy was made at the access site with an 11 scalpel. A 0.018" wire was advanced into the SVC and the access needle exchanged for a 24F micropuncture vascular sheath. The 0.018" wire was then removed and a 0.035" wire advanced into the IVC. An appropriate location for the subcutaneous reservoir was selected below the clavicle and an incision was made through the skin and underlying soft tissues. The subcutaneous tissues were then dissected using a combination of blunt and sharp surgical technique and a pocket was formed. A Bard ISP single lumen power injectable portacatheter was then tunneled through the subcutaneous tissues from the pocket to the dermatotomy and the port reservoir placed within the subcutaneous pocket. The venous access site was then serially dilated and a peel away vascular sheath placed over the wire. The wire was removed and the port catheter advanced into position under fluoroscopic guidance. The catheter tip is positioned in the superior cavoatrial junction. This was documented with a spot image. The portacatheter was then tested and found to flush and aspirate well. The port was flushed with saline followed by 100 units/mL heparinized saline. The pocket was then closed in two layers using first subdermal inverted interrupted absorbable sutures followed by a running subcuticular suture. The epidermis was then sealed with Dermabond. The dermatotomy at the venous access site was also closed with Dermabond.  IMPRESSION: Successful placement of a right IJ approach Power Port with ultrasound and fluoroscopic guidance. The catheter is ready for use. Electronically Signed   By: Malachy Moan M.D.   On: 11/13/2022 15:27   NM PET Image Initial (PI) Skull Base To Thigh  Result Date: 11/13/2022 CLINICAL DATA:  Initial treatment strategy  for esophageal carcinoma. EXAM: NUCLEAR MEDICINE PET SKULL BASE TO THIGH TECHNIQUE: 7.65 mCi F-18 FDG was injected intravenously. Full-ring PET imaging was performed from the skull base to thigh after the radiotracer. CT data was obtained and used for attenuation correction and anatomic localization. Fasting blood glucose: 93 mg/dl COMPARISON:  CT 96/08/5407 FINDINGS: Mediastinal blood pool activity: SUV max 1.39 Liver activity: SUV max NA NECK: No hypermetabolic lymph nodes in the neck. Incidental CT findings: None. CHEST: Tracer avid tumor within the distal esophagus has an SUV max 9.97 with corresponding mild circumferential wall thickening on the CT images. No tracer avid mediastinal or hilar lymph nodes. Tiny nodule within the medial left lower lobe is too small to characterize by PET-CT measuring 4 mm with SUV max 1.26, image 59/4. Incidental CT findings: Signs of chronic postinflammatory lung disease identified with bilateral peripheral interstitial reticulation. Aortic atherosclerosis with coronary artery calcifications. ABDOMEN/PELVIS: Extensive tracer avid liver metastases are identified throughout both lobes of the liver. Lesions are too numerous to count. Reference lesions include: -within segment 7/8 right hepatic lobe lesion measures 8.8 x 7.1 cm within SUV max of 30.72, image 72/4. -within segment 3 lesion measures 3.6 x 3.0 cm with SUV max of 25.08, image 88/4. -within segment 6 lesion measures 5.4 x 4.2 cm with SUV max 30.95, image 100/4. Tracer avid tumor extending from the distal esophagus into the cardia has an SUV max 14.45, image 75/4. No tracer avid abdominopelvic  lymph nodes. Incidental CT findings: Aortic atherosclerosis. Small gallstones identified. SKELETON: No focal hypermetabolic activity to suggest skeletal metastasis. Incidental CT findings: There is asymmetric increased uptake localizing to the right first rib costosternal junction with SUV max 5.78, image 48/4. Asymmetric uptake localizing to the left [facility] joint is also noted. Status post bilateral hip arthroplasty. IMPRESSION: 1. Tracer avid tumor is identified within the distal esophagus extending into the cardia. 2. Extensive tracer avid liver metastases. 3. Tiny nodule within the medial left lower lobe is too small to characterize by PET-CT measuring 4 mm. Attention to this nodule on future surveillance imaging is advised. 4. Asymmetric uptake localizing to the right first rib costosternal junction and left AC joint is noted. Findings are favored to represent arthropathic change 5. Gallstones. 6.  Aortic Atherosclerosis (ICD10-I70.0). Electronically Signed   By: Signa Kell M.D.   On: 11/13/2022 10:20   DG SWALLOW FUNC OP MEDICARE SPEECH PATH  Result Date: 11/09/2022 CLINICAL DATA:  Provided history: Cough, unspecified type. Dysphagia, unspecified type. EXAM: MODIFIED BARIUM SWALLOW TECHNIQUE: Different consistencies of barium were administered orally to the patient by the speech pathologist. Imaging of the pharynx was performed in the lateral projection. Mina Marble, PA-C (supervised by Dr. Jackey Loge) was present in the fluoroscopy room for the study, and operated the fluoroscopy equipment. FLUOROSCOPY: Radiation Exposure Index (as provided by the fluoroscopic device): 6.70 mGy Kerma COMPARISON:  None. FINDINGS: Different consistencies of barium were administered orally to the patient by the speech pathologist with fluoroscopic imaging of the pharynx from a lateral projection. Mina Marble, PA-C (supervised by Dr. Jackey Loge) was present in the fluoroscopy room and operated the fluoroscopy equipment.  The speech pathologist observed aspiration with thin and nectar consistencies. Please refer to the speech pathologist's report for full details. Bridging ventral cervical osteophyte noted at C4-C5. IMPRESSION: 1. Modified barium swallow as described. 2. Aspiration was observed with thin and nectar consistencies. Please refer to the speech pathologist's report for complete details and recommendations. Electronically Signed   By: Ronaldo Miyamoto  Renette Butters D.O.   On: 11/09/2022 14:06

## 2022-11-29 NOTE — Assessment & Plan Note (Addendum)
Follow-up with nutritionist Continue nutrition supplements.  Recommend him to hold off Statin.  Add Megace 20mg  Bid.

## 2022-11-29 NOTE — Assessment & Plan Note (Signed)
Chronic dysuria and urinary urgency.  He does not want to take Flomax due to side effect.  Check UA urine culture

## 2022-11-30 LAB — URINE CULTURE: Culture: NO GROWTH

## 2022-12-01 ENCOUNTER — Inpatient Hospital Stay: Payer: Medicare Other

## 2022-12-01 VITALS — BP 100/65 | HR 75 | Temp 97.3°F | Resp 16

## 2022-12-01 DIAGNOSIS — C159 Malignant neoplasm of esophagus, unspecified: Secondary | ICD-10-CM

## 2022-12-01 DIAGNOSIS — Z5112 Encounter for antineoplastic immunotherapy: Secondary | ICD-10-CM | POA: Diagnosis not present

## 2022-12-01 LAB — CEA: CEA: 182 ng/mL — ABNORMAL HIGH (ref 0.0–4.7)

## 2022-12-01 MED ORDER — SODIUM CHLORIDE 0.9% FLUSH
10.0000 mL | INTRAVENOUS | Status: DC | PRN
  Administered 2022-12-01: 10 mL
  Filled 2022-12-01: qty 10

## 2022-12-01 MED ORDER — HEPARIN SOD (PORK) LOCK FLUSH 100 UNIT/ML IV SOLN
500.0000 [IU] | Freq: Once | INTRAVENOUS | Status: AC | PRN
  Administered 2022-12-01: 500 [IU]
  Filled 2022-12-01: qty 5

## 2022-12-03 ENCOUNTER — Inpatient Hospital Stay
Admission: EM | Admit: 2022-12-03 | Discharge: 2022-12-05 | DRG: 543 | Disposition: A | Discharge status: Home Health | Payer: Medicare Other | Attending: Obstetrics and Gynecology | Admitting: Obstetrics and Gynecology

## 2022-12-03 ENCOUNTER — Other Ambulatory Visit: Payer: Self-pay

## 2022-12-03 DIAGNOSIS — Z880 Allergy status to penicillin: Secondary | ICD-10-CM

## 2022-12-03 DIAGNOSIS — S32009A Unspecified fracture of unspecified lumbar vertebra, initial encounter for closed fracture: Secondary | ICD-10-CM | POA: Diagnosis present

## 2022-12-03 DIAGNOSIS — E119 Type 2 diabetes mellitus without complications: Secondary | ICD-10-CM | POA: Diagnosis present

## 2022-12-03 DIAGNOSIS — I482 Chronic atrial fibrillation, unspecified: Secondary | ICD-10-CM | POA: Diagnosis present

## 2022-12-03 DIAGNOSIS — G894 Chronic pain syndrome: Secondary | ICD-10-CM | POA: Diagnosis present

## 2022-12-03 DIAGNOSIS — M8008XA Age-related osteoporosis with current pathological fracture, vertebra(e), initial encounter for fracture: Secondary | ICD-10-CM | POA: Diagnosis not present

## 2022-12-03 DIAGNOSIS — G893 Neoplasm related pain (acute) (chronic): Secondary | ICD-10-CM | POA: Diagnosis present

## 2022-12-03 DIAGNOSIS — S32010A Wedge compression fracture of first lumbar vertebra, initial encounter for closed fracture: Secondary | ICD-10-CM

## 2022-12-03 DIAGNOSIS — T796XXA Traumatic ischemia of muscle, initial encounter: Secondary | ICD-10-CM | POA: Diagnosis present

## 2022-12-03 DIAGNOSIS — F17210 Nicotine dependence, cigarettes, uncomplicated: Secondary | ICD-10-CM | POA: Diagnosis present

## 2022-12-03 DIAGNOSIS — Z9221 Personal history of antineoplastic chemotherapy: Secondary | ICD-10-CM

## 2022-12-03 DIAGNOSIS — W19XXXA Unspecified fall, initial encounter: Secondary | ICD-10-CM

## 2022-12-03 DIAGNOSIS — Z8249 Family history of ischemic heart disease and other diseases of the circulatory system: Secondary | ICD-10-CM

## 2022-12-03 DIAGNOSIS — S32019A Unspecified fracture of first lumbar vertebra, initial encounter for closed fracture: Secondary | ICD-10-CM | POA: Diagnosis not present

## 2022-12-03 DIAGNOSIS — Z7901 Long term (current) use of anticoagulants: Secondary | ICD-10-CM

## 2022-12-03 DIAGNOSIS — Z8551 Personal history of malignant neoplasm of bladder: Secondary | ICD-10-CM

## 2022-12-03 DIAGNOSIS — I480 Paroxysmal atrial fibrillation: Secondary | ICD-10-CM | POA: Diagnosis present

## 2022-12-03 DIAGNOSIS — I10 Essential (primary) hypertension: Secondary | ICD-10-CM | POA: Diagnosis present

## 2022-12-03 DIAGNOSIS — C679 Malignant neoplasm of bladder, unspecified: Secondary | ICD-10-CM | POA: Diagnosis present

## 2022-12-03 DIAGNOSIS — Z96653 Presence of artificial knee joint, bilateral: Secondary | ICD-10-CM | POA: Diagnosis present

## 2022-12-03 DIAGNOSIS — W1830XA Fall on same level, unspecified, initial encounter: Secondary | ICD-10-CM | POA: Diagnosis present

## 2022-12-03 DIAGNOSIS — Z79899 Other long term (current) drug therapy: Secondary | ICD-10-CM

## 2022-12-03 DIAGNOSIS — C159 Malignant neoplasm of esophagus, unspecified: Secondary | ICD-10-CM | POA: Diagnosis present

## 2022-12-03 DIAGNOSIS — I4892 Unspecified atrial flutter: Secondary | ICD-10-CM | POA: Diagnosis present

## 2022-12-03 DIAGNOSIS — F329 Major depressive disorder, single episode, unspecified: Secondary | ICD-10-CM | POA: Diagnosis present

## 2022-12-03 DIAGNOSIS — F1120 Opioid dependence, uncomplicated: Secondary | ICD-10-CM | POA: Diagnosis present

## 2022-12-03 DIAGNOSIS — E785 Hyperlipidemia, unspecified: Secondary | ICD-10-CM | POA: Diagnosis present

## 2022-12-03 MED ORDER — METHOCARBAMOL 500 MG PO TABS
500.0000 mg | ORAL_TABLET | Freq: Once | ORAL | Status: AC
  Administered 2022-12-04: 500 mg via ORAL
  Filled 2022-12-03: qty 1

## 2022-12-03 MED ORDER — BACITRACIN ZINC 500 UNIT/GM EX OINT
TOPICAL_OINTMENT | Freq: Once | CUTANEOUS | Status: AC
  Administered 2022-12-04: 1 via TOPICAL
  Filled 2022-12-03: qty 0.9

## 2022-12-03 MED ORDER — ACETAMINOPHEN 500 MG PO TABS
1000.0000 mg | ORAL_TABLET | Freq: Once | ORAL | Status: DC
  Filled 2022-12-03: qty 2

## 2022-12-03 NOTE — ED Provider Notes (Signed)
[facility] Provider Note    Event Date/Time   First MD Initiated Contact with Patient 12/03/22 2321     (approximate)   History   Fall   HPI  Aaron Short is a 77 y.o. male who presents to the ED for evaluation of Fall   I review a cardiology clinic visit from June.  History of HTN, HLD, DM and esophageal cancer.  A-flutter on Xarelto.  Patient presents to the ED from the field after 2 falls and being found down.  He was at a local gun range by himself.  While reaching over to pick up some casings, patient reports falling over and striking his lower back on the ground.  He tried to use a nearby table to leverage himself to get upright, but reports the table fell onto him when doing so.  He tried to "army crawl" along the ground, but was unable to get upright.  He was reportedly on the ground for 4+ hours  Physical Exam   Triage Vital Signs: ED Triage Vitals  Enc Vitals Group     BP      Pulse      Resp      Temp      Temp src      SpO2      Weight      Height      Head Circumference      Peak Flow      Pain Score      Pain Loc      Pain Edu?      Excl. in GC?     Most recent vital signs: Vitals:   12/03/22 2324  BP: 125/71  Pulse: 73  Resp: 16  Temp: (!) 97.4 F (36.3 C)  SpO2: 99%    General: Awake, no distress.  CV:  Good peripheral perfusion.  Resp:  Normal effort.  Abd:  No distention.  MSK:  Superficial abrasions to the bilateral proximal ulnar forearms with good range of motion of the wrist and elbows without discrete laceration. When rolling on his side, he has abrasions to his lower lumbar as well as his thoracolumbar back.  No discrete laceration.  Tenderness to the thoracolumbar junction. Tenderness with compression of the rib cage laterally Neuro:  No focal deficits appreciated. Other:     ED Results / Procedures / Treatments   Labs (all labs ordered are listed, but only abnormal results are  displayed) Labs Reviewed  CBC WITH DIFFERENTIAL/PLATELET - Abnormal; Notable for the following components:      Result Value   WBC 3.3 (*)    RBC 3.43 (*)    Hemoglobin 11.2 (*)    HCT 33.0 (*)    All other components within normal limits  BASIC METABOLIC PANEL - Abnormal; Notable for the following components:   Sodium 132 (*)    Glucose, Bld 132 (*)    BUN 27 (*)    Calcium 8.7 (*)    All other components within normal limits  CK - Abnormal; Notable for the following components:   Total CK 1,129 (*)    All other components within normal limits  URINALYSIS, ROUTINE W REFLEX MICROSCOPIC - Abnormal; Notable for the following components:   Color, Urine AMBER (*)    APPearance HAZY (*)    Protein, ur 100 (*)    All other components within normal limits    EKG Sinus rhythm at a rate of 73 bpm.  Normal  axis and intervals.  Tremulous baseline.  No clear signs of acute ischemia.  RADIOLOGY CT head interpreted by me without evidence of acute intracranial pathology CT cervical spine interpreted by me without evidence of fracture or dislocation CT T and L-spine interpreted by me with L1 compression fracture CT chest interpreted by me without rib fractures or pneumothorax.  Official radiology report(s): CT CHEST WO CONTRAST  Result Date: 12/04/2022 CLINICAL DATA:  fall, eval rib fx, ptx.  Esophageal cancer. EXAM: CT CHEST WITHOUT CONTRAST TECHNIQUE: Multidetector CT imaging of the chest was performed following the standard protocol without IV contrast. RADIATION DOSE REDUCTION: This exam was performed according to the departmental dose-optimization program which includes automated exposure control, adjustment of the mA and/or kV according to patient size and/or use of iterative reconstruction technique. COMPARISON:  Outside study 10/20/2022.  PET CT 11/13/2022. FINDINGS: Cardiovascular: Coronary artery and aortic calcifications. Heart is normal size. Aorta is normal caliber.  Mediastinum/Nodes: No mediastinal, hilar, or axillary adenopathy. Trachea and thyroid unremarkable. Mild distal esophageal wall thickening. Lungs/Pleura: Changes of fibrosis peripherally in the lungs. No acute confluent airspace opacities or effusions. No pneumothorax. Upper Abdomen: Numerous hepatic metastases as seen on prior study. Musculoskeletal: No visible rib fracture. Chronic compression fractures noted at T12 and L2. Mild compression fracture through the superior endplate of L1 is new since prior CT. Chest wall soft tissues unremarkable. IMPRESSION: Mild new compression fracture through the superior endplate of L1. Stable chronic compression fractures at T12 and L2. Coronary artery disease, aortic atherosclerosis. Mild wall thickening in the distal esophagus in the area of non esophageal cancer as seen on prior PET CT. Extensive a patent metastases. Fibrosis peripherally in the lungs.  No pneumothorax or effusion. Aortic Atherosclerosis (ICD10-I70.0). Electronically Signed   By: Charlett Nose M.D.   On: 12/04/2022 00:40   CT Lumbar Spine Wo Contrast  Result Date: 12/04/2022 CLINICAL DATA:  Fall, tailbone pain. EXAM: CT LUMBAR SPINE WITHOUT CONTRAST TECHNIQUE: Multidetector CT imaging of the lumbar spine was performed without intravenous contrast administration. Multiplanar CT image reconstructions were also generated. RADIATION DOSE REDUCTION: This exam was performed according to the departmental dose-optimization program which includes automated exposure control, adjustment of the mA and/or kV according to patient size and/or use of iterative reconstruction technique. COMPARISON:  PET CT 11/13/2022. outside CT 10/20/2022 FINDINGS: Segmentation: 5 lumbar type vertebrae. Alignment: Normal. Vertebrae: Moderate compression fractures involving T12 and L2. Mild compression fracture through the superior endplate of L1. Paraspinal and other soft tissues: Negative Disc levels: Diffuse degenerative facet disease.  Anterior spurring throughout the lower thoracic and lumbar spine. Vacuum disc noted. IMPRESSION: Mild to moderate compression fractures involving T12, L1 and L2 vertebral bodies. The T12 and L2 vertebral bodies are chronic, seen on outside CT from 10/20/2022. The L1 compression fracture is new since that study. Electronically Signed   By: Charlett Nose M.D.   On: 12/04/2022 00:35   CT Cervical Spine Wo Contrast  Result Date: 12/04/2022 CLINICAL DATA:  Fall EXAM: CT CERVICAL SPINE WITHOUT CONTRAST TECHNIQUE: Multidetector CT imaging of the cervical spine was performed without intravenous contrast. Multiplanar CT image reconstructions were also generated. RADIATION DOSE REDUCTION: This exam was performed according to the departmental dose-optimization program which includes automated exposure control, adjustment of the mA and/or kV according to patient size and/or use of iterative reconstruction technique. COMPARISON:  None Available. FINDINGS: Alignment: No subluxation Skull base and vertebrae: No acute fracture. No primary bone lesion or focal pathologic process. Soft tissues and spinal  canal: No prevertebral fluid or swelling. No visible canal hematoma. Disc levels: Diffuse degenerative disc disease with disc space narrowing and anterior spurring. Bilateral degenerative facet disease. Upper chest: No acute findings.  Biapical scarring. Other: None IMPRESSION: Cervical spondylosis.  No acute bony abnormality. Electronically Signed   By: Charlett Nose M.D.   On: 12/04/2022 00:28   CT HEAD WO CONTRAST ( )  Result Date: 12/04/2022 CLINICAL DATA:  Fall EXAM: CT HEAD WITHOUT CONTRAST TECHNIQUE: Contiguous axial images were obtained from the base of the skull through the vertex without intravenous contrast. RADIATION DOSE REDUCTION: This exam was performed according to the departmental dose-optimization program which includes automated exposure control, adjustment of the mA and/or kV according to patient size and/or  use of iterative reconstruction technique. COMPARISON:  None Available. FINDINGS: Brain: There is atrophy and chronic small vessel disease changes. No acute intracranial abnormality. Specifically, no hemorrhage, hydrocephalus, mass lesion, acute infarction, or significant intracranial injury. Vascular: No hyperdense vessel or unexpected calcification. Skull: No acute calvarial abnormality. Sinuses/Orbits: No acute findings Other: None IMPRESSION: Atrophy, chronic microvascular disease. No acute intracranial abnormality. Electronically Signed   By: Charlett Nose M.D.   On: 12/04/2022 00:26    PROCEDURES and INTERVENTIONS:  .1-3 Lead EKG Interpretation  Performed by: Delton Prairie, MD Authorized by: Delton Prairie, MD     Interpretation: normal     ECG rate:  70   ECG rate assessment: normal     Rhythm: sinus rhythm     Ectopy: none     Conduction: normal     Medications  acetaminophen (TYLENOL) tablet 1,000 mg (1,000 mg Oral Patient Refused/Not Given 12/04/22 0041)  fentaNYL (SUBLIMAZE) injection 50 mcg (has no administration in time range)  methocarbamol (ROBAXIN) tablet 500 mg (500 mg Oral Given 12/04/22 0042)  bacitracin ointment (1 Application Topical Given 12/04/22 0043)  lactated ringers bolus 1,000 mL (1,000 mLs Intravenous New Bag/Given 12/04/22 0044)     IMPRESSION / MDM / ASSESSMENT AND PLAN / ED COURSE  I reviewed the triage vital signs and the nursing notes.  Differential diagnosis includes, but is not limited to, spinal compression fracture, pneumothorax, rib fracture, ICH, rhabdomyolysis, AKI, electrolyte derangement  {Patient presents with symptoms of an acute illness or injury that is potentially life-threatening.  Patient presents after a fall and extended downtime with evidence of L1 compression fracture and rhabdomyolysis.  Reassuring vital signs.  No evidence of neurologic deficits.  Benign abdomen.  Blood work with rhabdo but normal renal function and reassuring CBC.  Urine  without infectious features.  Imaging with a acute L1 fracture but otherwise reassuring.  We will obtain a TLSO brace and consult medicine for observation admission.  Clinical Course as of 12/04/22 0107  Mon Dec 04, 2022  1610 Reassessed and discussed plan of care.  After discussing risks and benefits and options patient would like to stay for observation admission which I think is reasonable.  We will consult with hospitalist. [DS]    Clinical Course User Index [DS] Delton Prairie, MD     FINAL CLINICAL IMPRESSION(S) / ED DIAGNOSES   Final diagnoses:  Traumatic rhabdomyolysis, initial encounter [facility])  Fall, initial encounter  Closed compression fracture of body of L1 vertebra (HCC)     Rx / DC Orders   ED Discharge Orders     None        Note:  This document was prepared using Dragon voice recognition software and may include unintentional dictation errors.   Delton Prairie,  MD 12/04/22 1610

## 2022-12-03 NOTE — ED Triage Notes (Signed)
BIB EMS/ at gun range when pt tripped over feet, tried to pull up on table and large table fell on pt/ several abrasions to arms and back/ pt c/o generalized pain/ fent/4mg  zofran given en route/ denies LOC/ take xarelto

## 2022-12-04 ENCOUNTER — Encounter: Payer: Self-pay | Admitting: Internal Medicine

## 2022-12-04 ENCOUNTER — Emergency Department: Payer: Medicare Other

## 2022-12-04 DIAGNOSIS — S32019A Unspecified fracture of first lumbar vertebra, initial encounter for closed fracture: Secondary | ICD-10-CM | POA: Diagnosis present

## 2022-12-04 DIAGNOSIS — E785 Hyperlipidemia, unspecified: Secondary | ICD-10-CM | POA: Diagnosis present

## 2022-12-04 DIAGNOSIS — F17210 Nicotine dependence, cigarettes, uncomplicated: Secondary | ICD-10-CM | POA: Diagnosis present

## 2022-12-04 DIAGNOSIS — Z880 Allergy status to penicillin: Secondary | ICD-10-CM | POA: Diagnosis not present

## 2022-12-04 DIAGNOSIS — E119 Type 2 diabetes mellitus without complications: Secondary | ICD-10-CM | POA: Diagnosis present

## 2022-12-04 DIAGNOSIS — F1120 Opioid dependence, uncomplicated: Secondary | ICD-10-CM | POA: Diagnosis present

## 2022-12-04 DIAGNOSIS — I10 Essential (primary) hypertension: Secondary | ICD-10-CM | POA: Diagnosis present

## 2022-12-04 DIAGNOSIS — G893 Neoplasm related pain (acute) (chronic): Secondary | ICD-10-CM | POA: Diagnosis present

## 2022-12-04 DIAGNOSIS — I4892 Unspecified atrial flutter: Secondary | ICD-10-CM | POA: Diagnosis present

## 2022-12-04 DIAGNOSIS — T796XXA Traumatic ischemia of muscle, initial encounter: Secondary | ICD-10-CM

## 2022-12-04 DIAGNOSIS — I482 Chronic atrial fibrillation, unspecified: Secondary | ICD-10-CM | POA: Diagnosis present

## 2022-12-04 DIAGNOSIS — S32009A Unspecified fracture of unspecified lumbar vertebra, initial encounter for closed fracture: Secondary | ICD-10-CM | POA: Diagnosis not present

## 2022-12-04 DIAGNOSIS — C679 Malignant neoplasm of bladder, unspecified: Secondary | ICD-10-CM | POA: Diagnosis present

## 2022-12-04 DIAGNOSIS — Z8249 Family history of ischemic heart disease and other diseases of the circulatory system: Secondary | ICD-10-CM | POA: Diagnosis not present

## 2022-12-04 DIAGNOSIS — Z9221 Personal history of antineoplastic chemotherapy: Secondary | ICD-10-CM | POA: Diagnosis not present

## 2022-12-04 DIAGNOSIS — Z96653 Presence of artificial knee joint, bilateral: Secondary | ICD-10-CM | POA: Diagnosis present

## 2022-12-04 DIAGNOSIS — G894 Chronic pain syndrome: Secondary | ICD-10-CM | POA: Diagnosis present

## 2022-12-04 DIAGNOSIS — Z79899 Other long term (current) drug therapy: Secondary | ICD-10-CM | POA: Diagnosis not present

## 2022-12-04 DIAGNOSIS — Z8551 Personal history of malignant neoplasm of bladder: Secondary | ICD-10-CM | POA: Diagnosis not present

## 2022-12-04 DIAGNOSIS — W1830XA Fall on same level, unspecified, initial encounter: Secondary | ICD-10-CM | POA: Diagnosis present

## 2022-12-04 DIAGNOSIS — Z7901 Long term (current) use of anticoagulants: Secondary | ICD-10-CM | POA: Diagnosis not present

## 2022-12-04 DIAGNOSIS — I480 Paroxysmal atrial fibrillation: Secondary | ICD-10-CM | POA: Diagnosis present

## 2022-12-04 DIAGNOSIS — F329 Major depressive disorder, single episode, unspecified: Secondary | ICD-10-CM | POA: Diagnosis present

## 2022-12-04 DIAGNOSIS — M8008XA Age-related osteoporosis with current pathological fracture, vertebra(e), initial encounter for fracture: Secondary | ICD-10-CM | POA: Diagnosis present

## 2022-12-04 DIAGNOSIS — C159 Malignant neoplasm of esophagus, unspecified: Secondary | ICD-10-CM | POA: Diagnosis present

## 2022-12-04 LAB — CBC WITH DIFFERENTIAL/PLATELET
Abs Immature Granulocytes: 0.03 10*3/uL (ref 0.00–0.07)
Basophils Absolute: 0 10*3/uL (ref 0.0–0.1)
Basophils Relative: 1 %
Eosinophils Absolute: 0 10*3/uL (ref 0.0–0.5)
Eosinophils Relative: 1 %
HCT: 33 % — ABNORMAL LOW (ref 39.0–52.0)
Hemoglobin: 11.2 g/dL — ABNORMAL LOW (ref 13.0–17.0)
Immature Granulocytes: 1 %
Lymphocytes Relative: 40 %
Lymphs Abs: 1.3 10*3/uL (ref 0.7–4.0)
MCH: 32.7 pg (ref 26.0–34.0)
MCHC: 33.9 g/dL (ref 30.0–36.0)
MCV: 96.2 fL (ref 80.0–100.0)
Monocytes Absolute: 0.2 10*3/uL (ref 0.1–1.0)
Monocytes Relative: 5 %
Neutro Abs: 1.7 10*3/uL (ref 1.7–7.7)
Neutrophils Relative %: 52 %
Platelets: 283 10*3/uL (ref 150–400)
RBC: 3.43 MIL/uL — ABNORMAL LOW (ref 4.22–5.81)
RDW: 14.5 % (ref 11.5–15.5)
WBC: 3.3 10*3/uL — ABNORMAL LOW (ref 4.0–10.5)
nRBC: 0 % (ref 0.0–0.2)

## 2022-12-04 LAB — URINALYSIS, ROUTINE W REFLEX MICROSCOPIC
Bacteria, UA: NONE SEEN
Bilirubin Urine: NEGATIVE
Glucose, UA: NEGATIVE mg/dL
Hgb urine dipstick: NEGATIVE
Ketones, ur: NEGATIVE mg/dL
Leukocytes,Ua: NEGATIVE
Nitrite: NEGATIVE
Protein, ur: 100 mg/dL — AB
Specific Gravity, Urine: 1.025 (ref 1.005–1.030)
Squamous Epithelial / HPF: NONE SEEN /HPF (ref 0–5)
pH: 5 (ref 5.0–8.0)

## 2022-12-04 LAB — BASIC METABOLIC PANEL
Anion gap: 10 (ref 5–15)
BUN: 27 mg/dL — ABNORMAL HIGH (ref 8–23)
CO2: 24 mmol/L (ref 22–32)
Calcium: 8.7 mg/dL — ABNORMAL LOW (ref 8.9–10.3)
Chloride: 98 mmol/L (ref 98–111)
Creatinine, Ser: 0.72 mg/dL (ref 0.61–1.24)
GFR, Estimated: >60 mL/min (ref >60)
Glucose, Bld: 132 mg/dL — ABNORMAL HIGH (ref 70–99)
Potassium: 4 mmol/L (ref 3.5–5.1)
Sodium: 132 mmol/L — ABNORMAL LOW (ref 135–145)

## 2022-12-04 LAB — BASIC METABOLIC PANEL WITH GFR
Anion gap: 8 (ref 5–15)
BUN: 21 mg/dL (ref 8–23)
CO2: 27 mmol/L (ref 22–32)
Calcium: 8.5 mg/dL — ABNORMAL LOW (ref 8.9–10.3)
Chloride: 98 mmol/L (ref 98–111)
Creatinine, Ser: 0.58 mg/dL — ABNORMAL LOW (ref 0.61–1.24)
GFR, Estimated: >60 mL/min
Glucose, Bld: 93 mg/dL (ref 70–99)
Potassium: 4 mmol/L (ref 3.5–5.1)
Sodium: 133 mmol/L — ABNORMAL LOW (ref 135–145)

## 2022-12-04 LAB — CK
Total CK: 1129 U/L — ABNORMAL HIGH (ref 49–397)
Total CK: 1941 U/L — ABNORMAL HIGH (ref 49–397)

## 2022-12-04 MED ORDER — CARISOPRODOL 350 MG PO TABS
350.0000 mg | ORAL_TABLET | Freq: Two times a day (BID) | ORAL | Status: DC
  Administered 2022-12-04 – 2022-12-05 (×3): 350 mg via ORAL
  Filled 2022-12-04 (×4): qty 1

## 2022-12-04 MED ORDER — MELATONIN 5 MG PO TABS: 5.0000 mg | ORAL_TABLET | Freq: Every evening | ORAL | Status: DC | PRN

## 2022-12-04 MED ORDER — FENTANYL CITRATE PF 50 MCG/ML IJ SOSY
50.0000 ug | PREFILLED_SYRINGE | Freq: Once | INTRAMUSCULAR | Status: AC
  Administered 2022-12-04: 50 ug via INTRAVENOUS
  Filled 2022-12-04: qty 1

## 2022-12-04 MED ORDER — RIVAROXABAN 20 MG PO TABS: 20.0000 mg | ORAL_TABLET | Freq: Every day | ORAL | Status: DC

## 2022-12-04 MED ORDER — SODIUM CHLORIDE 0.9 % IV SOLN: INTRAVENOUS | Status: AC

## 2022-12-04 MED ORDER — ACETAMINOPHEN 500 MG PO TABS: 500.0000 mg | ORAL_TABLET | Freq: Three times a day (TID) | ORAL | Status: DC | PRN

## 2022-12-04 MED ORDER — LACTATED RINGERS IV BOLUS
1000.0000 mL | Freq: Once | INTRAVENOUS | Status: AC
  Administered 2022-12-04: 1000 mL via INTRAVENOUS

## 2022-12-04 MED ORDER — ONDANSETRON HCL 4 MG PO TABS: 4.0000 mg | ORAL_TABLET | Freq: Four times a day (QID) | ORAL | Status: DC | PRN

## 2022-12-04 MED ORDER — SODIUM CHLORIDE 0.9 % IV SOLN: INTRAVENOUS | Status: DC

## 2022-12-04 MED ORDER — FAMOTIDINE 20 MG PO TABS
20.0000 mg | ORAL_TABLET | Freq: Every day | ORAL | Status: DC
  Administered 2022-12-04 – 2022-12-05 (×2): 20 mg via ORAL
  Filled 2022-12-04 (×2): qty 1

## 2022-12-04 MED ORDER — SERTRALINE HCL 50 MG PO TABS
50.0000 mg | ORAL_TABLET | Freq: Every day | ORAL | Status: DC
  Administered 2022-12-04 – 2022-12-05 (×2): 50 mg via ORAL
  Filled 2022-12-04 (×2): qty 1

## 2022-12-04 MED ORDER — RIVAROXABAN 20 MG PO TABS
20.0000 mg | ORAL_TABLET | Freq: Every day | ORAL | Status: DC
  Administered 2022-12-04: 20 mg via ORAL
  Filled 2022-12-04 (×2): qty 1

## 2022-12-04 MED ORDER — MORPHINE SULFATE ER 15 MG PO TBCR
15.0000 mg | EXTENDED_RELEASE_TABLET | Freq: Two times a day (BID) | ORAL | Status: DC
  Administered 2022-12-04 – 2022-12-05 (×3): 15 mg via ORAL
  Filled 2022-12-04 (×3): qty 1

## 2022-12-04 MED ORDER — ONDANSETRON HCL 4 MG/2ML IJ SOLN: 4.0000 mg | Freq: Four times a day (QID) | INTRAMUSCULAR | Status: DC | PRN

## 2022-12-04 MED ORDER — OXYCODONE HCL 5 MG PO TABS
10.0000 mg | ORAL_TABLET | ORAL | Status: DC | PRN
  Administered 2022-12-04 – 2022-12-05 (×3): 20 mg via ORAL
  Filled 2022-12-04 (×2): qty 2
  Filled 2022-12-04 (×2): qty 4

## 2022-12-04 MED ORDER — ENOXAPARIN SODIUM 40 MG/0.4ML IJ SOSY: 40.0000 mg | PREFILLED_SYRINGE | INTRAMUSCULAR | Status: DC

## 2022-12-04 MED ORDER — LOPERAMIDE HCL 2 MG PO CAPS: 2.0000 mg | ORAL_CAPSULE | ORAL | Status: DC | PRN

## 2022-12-04 MED ORDER — MEGESTROL ACETATE 20 MG PO TABS
20.0000 mg | ORAL_TABLET | Freq: Two times a day (BID) | ORAL | Status: DC
  Administered 2022-12-04 – 2022-12-05 (×3): 20 mg via ORAL
  Filled 2022-12-04 (×3): qty 1

## 2022-12-04 NOTE — ED Notes (Signed)
Pt refused back brace/ MD made aware .

## 2022-12-04 NOTE — Assessment & Plan Note (Signed)
Bladder cancer on surveillance cystoscopy Patient receives chemo

## 2022-12-04 NOTE — Consult Note (Signed)
Consulting Department:  Emergency department  Primary Physician:  Rosemarie Ax, MD  Chief Complaint: Back pain/compression fracture  History of Present Illness: 12/04/2022 Johann Lesar is a 77 y.o. male who presents with the chief complaint of compression fractures/back pain.  He has a history of multiple spinal compression fractures all treated conservatively.  He is not endorsing any new neurologic symptoms.  No weakness numbness or tingling.  No bowel or bladder changes.  He does endorse midline low back pain.  He has been previously treated with just watchful waiting, no bracing or kyphoplasty.  Review of Systems:  A 10 point review of systems is negative, except for the pertinent positives and negatives detailed in the HPI.  Past Medical History: Past Medical History:  Diagnosis Date   Arthralgia of lower leg 04/02/2012   Arthralgia of upper arm 06/18/2009   Cancer (HCC)    Diabetes mellitus without complication (HCC)    FH: atrial fibrillation    Hypertension     Past Surgical History: Past Surgical History:  Procedure Laterality Date   bladder cancer surgery  2015   ESOPHAGOGASTRODUODENOSCOPY (EGD) WITH PROPOFOL N/A 10/30/2022   Procedure: ESOPHAGOGASTRODUODENOSCOPY (EGD) WITH PROPOFOL;  Surgeon: Toney Reil, MD;  Location: [facility] ENDOSCOPY;  Service: Gastroenterology;  Laterality: N/A;   IR IMAGING GUIDED PORT INSERTION  11/13/2022   JOINT REPLACEMENT     REPLACEMENT TOTAL HIP W/  RESURFACING IMPLANTS Bilateral    SHOULDER SURGERY Bilateral    TOTAL KNEE ARTHROPLASTY Bilateral     Allergies: Allergies as of 12/03/2022 - Review Complete 12/03/2022  Allergen Reaction Noted   Diltiazem Swelling 04/14/2011   Penicillins Hives and Rash 09/13/2012   Tizanidine Other (See Comments) 09/13/2012    Medications:  Current Facility-Administered Medications:    acetaminophen (TYLENOL) tablet 1,000 mg, 1,000 mg, Oral, Once, Delton Prairie, MD   acetaminophen  (TYLENOL) tablet 500 mg, 500 mg, Oral, Q8H PRN, Andris Baumann, MD   carisoprodol (SOMA) tablet 350 mg, 350 mg, Oral, BID, Wouk, Wilfred Curtis, MD   enoxaparin (LOVENOX) injection 40 mg, 40 mg, Subcutaneous, Q24H, Wouk, Wilfred Curtis, MD   loperamide (IMODIUM) capsule 2 mg, 2 mg, Oral, PRN, Andris Baumann, MD   megestrol (MEGACE) tablet 20 mg, 20 mg, Oral, BID, Andris Baumann, MD   melatonin tablet 5 mg, 5 mg, Oral, QHS PRN, Andris Baumann, MD   morphine (MS CONTIN) 12 hr tablet 15 mg, 15 mg, Oral, Q12H, Andris Baumann, MD, 15 mg at 12/04/22 0931   ondansetron (ZOFRAN) tablet 4 mg, 4 mg, Oral, Q6H PRN **OR** ondansetron (ZOFRAN) injection 4 mg, 4 mg, Intravenous, Q6H PRN, Andris Baumann, MD   oxyCODONE (Oxy IR/ROXICODONE) immediate release tablet 10-20 mg, 10-20 mg, Oral, Q4H PRN, Andris Baumann, MD   sertraline (ZOLOFT) tablet 50 mg, 50 mg, Oral, Daily, Lindajo Royal V, MD, 50 mg at 12/04/22 1610  Current Outpatient Medications:    B Complex-C (VITAMIN B + C COMPLEX) TABS, Take 1 capsule by mouth every morning., Disp: , Rfl:    carisoprodol (SOMA) 350 MG tablet, Take 350 mg by mouth 2 (two) times daily. , Disp: , Rfl:    Cetirizine HCl 10 MG CAPS, Take 1 capsule by mouth every morning., Disp: , Rfl:    dexamethasone (DECADRON) 4 MG tablet, Take 2 tablets (8 mg total) by mouth daily. Start the day after chemotherapy for 2 days. Take with food., Disp: 30 tablet, Rfl: 1   lidocaine-prilocaine (EMLA) cream, Apply  to affected area once, Disp: 30 g, Rfl: 3   loperamide (IMODIUM) 2 MG capsule, Take 1 capsule (2 mg total) by mouth See admin instructions. Initial: 4 mg,the 2 mg every 2 hours (4 mg every 4 hours at night)  maximum: 16 mg/day, Disp: 60 capsule, Rfl: 2   Magnesium 500 MG TABS, Take 500 mg by mouth as needed., Disp: , Rfl:    megestrol (MEGACE) 20 MG tablet, Take 1 tablet (20 mg total) by mouth 2 (two) times daily., Disp: 60 tablet, Rfl: 0   melatonin (MELATONIN MAXIMUM STRENGTH) 5 MG  TABS, Take 5 mg by mouth at bedtime as needed., Disp: , Rfl:    morphine (MS CONTIN) 15 MG 12 hr tablet, Take 1 tablet (15 mg total) by mouth every 12 (twelve) hours., Disp: 60 tablet, Rfl: 0   naloxone (NARCAN) nasal spray 4 mg/0.1 mL, Place 1 spray into the nose once for 1 dose. Spray half of bottle content into each nostril, then call 911, Disp: 2 each, Rfl: 0   omeprazole (PRILOSEC) 10 MG capsule, Take 20 mg by mouth daily., Disp: , Rfl:    ondansetron (ZOFRAN) 8 MG tablet, Take 1 tablet (8 mg total) by mouth every 8 (eight) hours as needed for nausea or vomiting. Start on the third day after chemotherapy., Disp: 30 tablet, Rfl: 1   Oxycodone HCl 10 MG TABS, Take 1-2 tablets (10-20 mg total) by mouth every 4 (four) hours as needed (pain)., Disp: 120 tablet, Rfl: 0   prazosin (MINIPRESS) 1 MG capsule, Take 1 mg by mouth at bedtime., Disp: , Rfl:    prochlorperazine (COMPAZINE) 10 MG tablet, Take 1 tablet (10 mg total) by mouth every 6 (six) hours as needed for nausea or vomiting., Disp: 30 tablet, Rfl: 1   rivaroxaban (XARELTO) 20 MG TABS tablet, Take 20 mg by mouth daily., Disp: , Rfl:    sertraline (ZOLOFT) 50 MG tablet, Take 50 mg by mouth at bedtime., Disp: , Rfl:    ALPRAZolam (XANAX) 0.5 MG tablet, Take 1 tablet (0.5 mg total) by mouth See admin instructions. Take 1 tablet 30 minutes prior to imaging procedure, may take another tablet if needed. (Patient not taking: Reported on 12/04/2022), Disp: 2 tablet, Rfl: 0   rosuvastatin (CRESTOR) 20 MG tablet, Take 20 mg by mouth daily. (Patient not taking: Reported on 12/04/2022), Disp: , Rfl:    Social History: Social History   Tobacco Use   Smoking status: Every Day    Packs/day: .5    Types: Cigarettes   Smokeless tobacco: Never  Vaping Use   Vaping Use: Never used  Substance Use Topics   Alcohol use: No    Alcohol/week: 0.0 standard drinks of alcohol   Drug use: No    Family Medical History: Family History  Problem Relation Age of  Onset   Dementia Mother    Heart disease Father     Physical Examination: Vitals:   12/04/22 0715 12/04/22 0758  BP:  (!) 118/58  Pulse: 62 60  Resp:  16  Temp:  97.8 F (36.6 C)  SpO2: 97% 100%     General: Patient is well developed, well nourished, calm, collected, and in no apparent distress.  NEUROLOGICAL:  General: In no acute distress.   Awake, alert, oriented to person, place, and time.  Pupils equal round and reactive to light.  Facial tone is symmetric.  Tongue protrusion is midline.  There is no pronator drift.  Pain to palpation in the  midline lower back   Strength:  Side Iliopsoas Quads Hamstring PF DF EHL  R 5 5 5 5 5 5   L 5 5 5 5 5 5     Bilateral upper and lower extremity sensation is intact to light touch. Reflexes are 1+ patella and achilles. Hoffman's is absent.  Clonus is not present.  Toes are down-going.    Imaging: Narrative & Impression  CLINICAL DATA:  Fall, tailbone pain.   EXAM: CT LUMBAR SPINE WITHOUT CONTRAST   TECHNIQUE: Multidetector CT imaging of the lumbar spine was performed without intravenous contrast administration. Multiplanar CT image reconstructions were also generated.   RADIATION DOSE REDUCTION: This exam was performed according to the departmental dose-optimization program which includes automated exposure control, adjustment of the mA and/or kV according to patient size and/or use of iterative reconstruction technique.   COMPARISON:  PET CT 11/13/2022. outside CT 10/20/2022   FINDINGS: Segmentation: 5 lumbar type vertebrae.   Alignment: Normal.   Vertebrae: Moderate compression fractures involving T12 and L2. Mild compression fracture through the superior endplate of L1.   Paraspinal and other soft tissues: Negative   Disc levels: Diffuse degenerative facet disease. Anterior spurring throughout the lower thoracic and lumbar spine. Vacuum disc noted.   IMPRESSION: Mild to moderate compression fractures  involving T12, L1 and L2 vertebral bodies. The T12 and L2 vertebral bodies are chronic, seen on outside CT from 10/20/2022. The L1 compression fracture is new since that study.     Electronically Signed   By: Charlett Nose M.D.   On: 12/04/2022 00:35       I have personally reviewed the images and agree with the above interpretation.  Labs:    Latest Ref Rng & Units 12/03/2022   11:44 PM 11/29/2022    8:02 AM 11/22/2022    7:55 AM  CBC  WBC 4.0 - 10.5 K/uL 3.3  7.9  6.3   Hemoglobin 13.0 - 17.0 g/dL 16.1  09.6  04.5   Hematocrit 39.0 - 52.0 % 33.0  34.0  35.7   Platelets 150 - 400 K/uL 283  200  242        Assessment and Plan: Mr. Higgason is a pleasant 77 y.o. male with history of multiple compression fractures.  He presented with back pain.  Has a new compression fracture noted at L1.  This is not compressive of the neural elements.  He is not having any new neurologic issues.  His physical exam shows full strength with no sensory changes.  Reflexes are intact.  No new bowel or bladder symptoms.  Closed compression fracture of body of L1 vertebra (HCC) New L1 compression fracture noted on his imaging.  He does have midline pain to palpation.  This is likely symptomatic.  Not having any neuro neurologic symptoms.  No evidence of neurologic compression.  This would be best treated conservatively.  Does not need any surgical intervention.  Could consider a TLSO brace for comfort as he is having a significant amount of back pain.  But given the fact that he is not having any neurologic deficits, no emergent/urgent surgery is needed.  He can follow-up in clinic with images in 4 weeks.  Fall, initial encounter Has had a recent fall, likely causing his compression fracture.  Would encourage physical therapy evaluation and recommendations.  Lovenia Kim, MD/MSCR Dept. of Neurosurgery

## 2022-12-04 NOTE — Progress Notes (Signed)
Orthopedic Tech Progress Note Patient Details:  Aaron Short February 03, 1946 [id number]  Patient ID: [name], male   DOB: [birth year], 77 y.o.   MRN: [id number] I called order into [facility]. I confirmed the patient would be admitted and called in a routine brace order. Trinna Post 12/04/2022, 1:56 AM

## 2022-12-04 NOTE — Assessment & Plan Note (Signed)
Continue home pain meds TLSO brace Neurosurgical consult

## 2022-12-04 NOTE — Assessment & Plan Note (Signed)
Continue Xarelto 

## 2022-12-04 NOTE — H&P (Signed)
History and Physical    Patient: Aaron Short MVH:[id number] DOB: 02/07/1946 DOA: 12/03/2022 DOS: the patient was seen and examined on 12/04/2022 PCP: Aaron Ax, MD  Patient coming from: Home  Chief Complaint:  Chief Complaint  Patient presents with   Fall    HPI: Aaron Short is a 77 y.o. male with medical history significant for Atrial fibrillation on Xarelto,  esophageal adenocarcinoma on chemo as well as bladder cancer, chemotherapy-induced diarrhea, chronic pain including cancer related pain on chronic opiatesWho presents to the ED after being found down following a fall.  [place] the [place] by himself when he reached over to pick up some casings and fell over striking his back.  He held onto the table for support but the table then fell over him.  He was unable to get up and had to crawl along the ground where he remained for 4 hours.  He was previously in his usual state of health. ED course and data review: On arrival patient was noted to have multiple abrasions and bruises about his body.  Vitals within normal limits.  Labs notable for elevated CK of 1129, mild leukopenia of 3.3, baseline hemoglobin of 11.2. EKG, personally viewed and interpreted showing sinus at 75 with nonspecific ST-T wave changes. Patient had extensive trauma imaging including CT head C-spine thoracic and lumbar spine with positive finding of a new L1 compression fracture among other chronic appearing fractures as described below: IMPRESSION: Mild to moderate compression fractures involving T12, L1 and L2 vertebral bodies. The T12 and L2 vertebral bodies are chronic, seen on outside CT from 10/20/2022. The L1 compression fracture is new since that study.  Patient treated with Tylenol, fentanyl and Robaxin and given an IV fluid bolus.  TLSO brace ordered Hospitalist consulted for admission   Review of Systems: As mentioned in the history of present illness. All other systems reviewed and  are negative.  Past Medical History:  Diagnosis Date   Arthralgia of lower leg 04/02/2012   Arthralgia of upper arm 06/18/2009   Cancer (HCC)    Diabetes mellitus without complication (HCC)    FH: atrial fibrillation    Hypertension    Past Surgical History:  Procedure Laterality Date   bladder cancer surgery  2015   ESOPHAGOGASTRODUODENOSCOPY (EGD) WITH PROPOFOL N/A 10/30/2022   Procedure: ESOPHAGOGASTRODUODENOSCOPY (EGD) WITH PROPOFOL;  Surgeon: Aaron Reil, MD;  Location: [facility] ENDOSCOPY;  Service: Gastroenterology;  Laterality: N/A;   IR IMAGING GUIDED PORT INSERTION  11/13/2022   JOINT REPLACEMENT     REPLACEMENT TOTAL HIP W/  RESURFACING IMPLANTS Bilateral    SHOULDER SURGERY Bilateral    TOTAL KNEE ARTHROPLASTY Bilateral    Social History:  reports that he has been smoking cigarettes. He has been smoking an average of .5 packs per day. He has never used smokeless tobacco. He reports that he does not drink alcohol and does not use drugs.  Allergies  Allergen Reactions   Diltiazem Swelling    Leg edema Leg edema Leg edema Leg edema Leg edema Leg edema Leg edema Leg edema Leg edema   Penicillins Hives and Rash    Had hives as child Other reaction(s): RASH Childhood allergy Other reaction(s): RASH Other reaction(s): RASH Childhood allergy Other reaction(s): RASH Other reaction(s): RASH Childhood allergy   Tizanidine Other (See Comments)    Other reaction(s): OTHER  Pt states it puts him out Other reaction(s): OTHER  Pt states it puts him out Other reaction(s): OTHER  Pt states  it puts him out Other reaction(s): OTHER  Pt states it puts him out Other reaction(s): OTHER  Pt states it puts him out Other reaction(s): OTHER  Pt states it puts him out    Family History  Problem Relation Age of Onset   Dementia Mother    Heart disease Father     Prior to Admission medications   Medication Sig Start Date End Date Taking? Authorizing Provider  acetaminophen  (TYLENOL) 500 MG tablet Take 500 mg by mouth every 8 (eight) hours as needed.    [provider]  ALPRAZolam Prudy Feeler) 0.5 MG tablet Take 1 tablet (0.5 mg total) by mouth See admin instructions. Take 1 tablet 30 minutes prior to imaging procedure, may take another tablet if needed. 11/09/22   Aaron Patience, MD  B Complex-C (VITAMIN B + C COMPLEX) TABS Take 1 capsule by mouth every morning.    [provider]  carisoprodol (SOMA) 350 MG tablet Take 350 mg by mouth 2 (two) times daily.     [provider]  Cetirizine HCl 10 MG CAPS Take 1 capsule by mouth every morning.    [provider]  dexamethasone (DECADRON) 4 MG tablet Take 2 tablets (8 mg total) by mouth daily. Start the day after chemotherapy for 2 days. Take with food. 11/03/22   Aaron Patience, MD  lidocaine-prilocaine (EMLA) cream Apply to affected area once 11/03/22   Aaron Patience, MD  loperamide (IMODIUM) 2 MG capsule Take 1 capsule (2 mg total) by mouth See admin instructions. Initial: 4 mg,the 2 mg every 2 hours (4 mg every 4 hours at night)  maximum: 16 mg/day 11/22/22   Aaron Patience, MD  Magnesium 500 MG TABS Take 500 mg by mouth as needed.    [provider]  megestrol (MEGACE) 20 MG tablet Take 1 tablet (20 mg total) by mouth 2 (two) times daily. 11/29/22   Aaron Patience, MD  melatonin (MELATONIN MAXIMUM STRENGTH) 5 MG TABS Take 5 mg by mouth at bedtime as needed.    [provider]  morphine (MS CONTIN) 15 MG 12 hr tablet Take 1 tablet (15 mg total) by mouth every 12 (twelve) hours. 11/29/22   Borders, Aaron Eastern, NP  naloxone [facility]) nasal spray 4 mg/0.1 mL Place 1 spray into the nose once for 1 dose. Spray half of bottle content into each nostril, then call 911 11/15/22 11/15/22  Borders, Aaron Eastern, NP  omeprazole (PRILOSEC) 10 MG capsule Take 20 mg by mouth daily.    [provider]  ondansetron (ZOFRAN) 8 MG tablet Take by mouth. 10/10/22   [provider]  ondansetron (ZOFRAN) 8 MG tablet Take 1  tablet (8 mg total) by mouth every 8 (eight) hours as needed for nausea or vomiting. Start on the third day after chemotherapy. 11/03/22   Aaron Patience, MD  Oxycodone HCl 10 MG TABS Take 1-2 tablets (10-20 mg total) by mouth every 4 (four) hours as needed (pain). 11/29/22   Borders, Aaron Eastern, NP  prazosin (MINIPRESS) 1 MG capsule Take 1 mg by mouth at bedtime. Patient not taking: Reported on 11/22/2022 10/01/20   [provider]  prochlorperazine (COMPAZINE) 10 MG tablet Take 1 tablet (10 mg total) by mouth every 6 (six) hours as needed for nausea or vomiting. 11/03/22   Aaron Patience, MD  rivaroxaban (XARELTO) 20 MG TABS tablet Take 20 mg by mouth. 03/26/17   [provider]  rosuvastatin (CRESTOR) 20 MG tablet Take 20 mg by mouth  daily.    [provider]  sertraline (ZOLOFT) 50 MG tablet Take by mouth. 10/05/22 01/07/23  [provider]    Physical Exam: Vitals:   12/03/22 2324 12/03/22 2326  BP: 125/71   Pulse: 73   Resp: 16   Temp: (!) 97.4 F (36.3 C)   TempSrc: Oral   SpO2: 99%   Weight:  59.9 kg   Physical Exam Vitals and nursing note reviewed.  Constitutional:      General: He is not in acute distress.    Comments: Frail-appearing elderly male  HENT:     Head: Normocephalic and atraumatic.  Cardiovascular:     Rate and Rhythm: Normal rate and regular rhythm.     Heart sounds: Normal heart sounds.  Pulmonary:     Effort: Pulmonary effort is normal.     Breath sounds: Normal breath sounds.  Abdominal:     Palpations: Abdomen is soft.     Tenderness: There is no abdominal tenderness.  Neurological:     Mental Status: Mental status is at baseline.     Labs on Admission: I have personally reviewed following labs and imaging studies  CBC: Recent Labs  Lab 11/29/22 0802 12/03/22 2344  WBC 7.9 3.3*  NEUTROABS 4.7 1.7  HGB 11.6* 11.2*  HCT 34.0* 33.0*  MCV 95.5 96.2  PLT 200 283   Basic Metabolic Panel: Recent Labs  Lab 11/29/22 0802  12/03/22 2344  NA 136 132*  K 3.8 4.0  CL 98 98  CO2 28 24  GLUCOSE 144* 132*  BUN 21 27*  CREATININE 0.70 0.72  CALCIUM 9.2 8.7*   GFR: Estimated Creatinine Clearance: 63.4 mL/min (by C-G formula based on SCr of 0.72 mg/dL). Liver Function Tests: Recent Labs  Lab 11/29/22 0802  AST 55*  ALT 25  ALKPHOS 138*  BILITOT 0.4  PROT 6.8  ALBUMIN 3.3*   No results for input(s): "LIPASE", "AMYLASE" in the last 168 hours. No results for input(s): "AMMONIA" in the last 168 hours. Coagulation Profile: No results for input(s): "INR", "PROTIME" in the last 168 hours. Cardiac Enzymes: Recent Labs  Lab 12/03/22 2344  CKTOTAL 1,129*   BNP (last 3 results) No results for input(s): "PROBNP" in the last 8760 hours. HbA1C: No results for input(s): "HGBA1C" in the last 72 hours. CBG: No results for input(s): "GLUCAP" in the last 168 hours. Lipid Profile: No results for input(s): "CHOL", "HDL", "LDLCALC", "TRIG", "CHOLHDL", "LDLDIRECT" in the last 72 hours. Thyroid Function Tests: No results for input(s): "TSH", "T4TOTAL", "FREET4", "T3FREE", "THYROIDAB" in the last 72 hours. Anemia Panel: No results for input(s): "VITAMINB12", "FOLATE", "FERRITIN", "TIBC", "IRON", "RETICCTPCT" in the last 72 hours. Urine analysis:    Component Value Date/Time   COLORURINE AMBER (A) 12/04/2022 0015   APPEARANCEUR HAZY (A) 12/04/2022 0015   APPEARANCEUR CLOUDY 09/14/2013 1214   LABSPEC 1.025 12/04/2022 0015   LABSPEC 1.010 09/14/2013 1214   PHURINE 5.0 12/04/2022 0015   GLUCOSEU NEGATIVE 12/04/2022 0015   GLUCOSEU NEGATIVE 09/14/2013 1214   HGBUR NEGATIVE 12/04/2022 0015   BILIRUBINUR NEGATIVE 12/04/2022 0015   BILIRUBINUR NEGATIVE 09/14/2013 1214   KETONESUR NEGATIVE 12/04/2022 0015   PROTEINUR 100 (A) 12/04/2022 0015   NITRITE NEGATIVE 12/04/2022 0015   LEUKOCYTESUR NEGATIVE 12/04/2022 0015   LEUKOCYTESUR 1+ 09/14/2013 1214    Radiological Exams on Admission: CT T-SPINE NO  CHARGE  Result Date: 12/04/2022 CLINICAL DATA:  Fall. EXAM: CT THORACIC SPINE WITHOUT CONTRAST TECHNIQUE: Multidetector CT images of the thoracic were  obtained using the standard protocol without intravenous contrast. RADIATION DOSE REDUCTION: This exam was performed according to the departmental dose-optimization program which includes automated exposure control, adjustment of the mA and/or kV according to patient size and/or use of iterative reconstruction technique. COMPARISON:  Chest CT today. PET CT 11/13/2022. Outside CT 10/20/2022. FINDINGS: Alignment: Normal Vertebrae: Chronic compression fractures involving the T12 and L2 vertebral body are stable since prior CT. Mild compression fracture through the superior endplate of L1 and superior endplate of T11 are new since prior study. Paraspinal and other soft tissues: Negative. Disc levels: Diffuse degenerative disc disease. Anterior and lateral spurring. IMPRESSION: Mild compression fractures through the superior endplates of T11 and L1, new since prior CT. Stable chronic mild compression fractures at T12 and L2. Electronically Signed   By: Charlett Nose M.D.   On: 12/04/2022 01:20   CT CHEST WO CONTRAST  Result Date: 12/04/2022 CLINICAL DATA:  fall, eval rib fx, ptx.  Esophageal cancer. EXAM: CT CHEST WITHOUT CONTRAST TECHNIQUE: Multidetector CT imaging of the chest was performed following the standard protocol without IV contrast. RADIATION DOSE REDUCTION: This exam was performed according to the departmental dose-optimization program which includes automated exposure control, adjustment of the mA and/or kV according to patient size and/or use of iterative reconstruction technique. COMPARISON:  Outside study 10/20/2022.  PET CT 11/13/2022. FINDINGS: Cardiovascular: Coronary artery and aortic calcifications. Heart is normal size. Aorta is normal caliber. Mediastinum/Nodes: No mediastinal, hilar, or axillary adenopathy. Trachea and thyroid unremarkable. Mild  distal esophageal wall thickening. Lungs/Pleura: Changes of fibrosis peripherally in the lungs. No acute confluent airspace opacities or effusions. No pneumothorax. Upper Abdomen: Numerous hepatic metastases as seen on prior study. Musculoskeletal: No visible rib fracture. Chronic compression fractures noted at T12 and L2. Mild compression fracture through the superior endplate of L1 is new since prior CT. Chest wall soft tissues unremarkable. IMPRESSION: Mild new compression fracture through the superior endplate of L1. Stable chronic compression fractures at T12 and L2. Coronary artery disease, aortic atherosclerosis. Mild wall thickening in the distal esophagus in the area of non esophageal cancer as seen on prior PET CT. Extensive a patent metastases. Fibrosis peripherally in the lungs.  No pneumothorax or effusion. Aortic Atherosclerosis (ICD10-I70.0). Electronically Signed   By: Charlett Nose M.D.   On: 12/04/2022 00:40   CT Lumbar Spine Wo Contrast  Result Date: 12/04/2022 CLINICAL DATA:  Fall, tailbone pain. EXAM: CT LUMBAR SPINE WITHOUT CONTRAST TECHNIQUE: Multidetector CT imaging of the lumbar spine was performed without intravenous contrast administration. Multiplanar CT image reconstructions were also generated. RADIATION DOSE REDUCTION: This exam was performed according to the departmental dose-optimization program which includes automated exposure control, adjustment of the mA and/or kV according to patient size and/or use of iterative reconstruction technique. COMPARISON:  PET CT 11/13/2022. outside CT 10/20/2022 FINDINGS: Segmentation: 5 lumbar type vertebrae. Alignment: Normal. Vertebrae: Moderate compression fractures involving T12 and L2. Mild compression fracture through the superior endplate of L1. Paraspinal and other soft tissues: Negative Disc levels: Diffuse degenerative facet disease. Anterior spurring throughout the lower thoracic and lumbar spine. Vacuum disc noted. IMPRESSION: Mild to  moderate compression fractures involving T12, L1 and L2 vertebral bodies. The T12 and L2 vertebral bodies are chronic, seen on outside CT from 10/20/2022. The L1 compression fracture is new since that study. Electronically Signed   By: Charlett Nose M.D.   On: 12/04/2022 00:35   CT Cervical Spine Wo Contrast  Result Date: 12/04/2022 CLINICAL DATA:  Fall EXAM: CT  CERVICAL SPINE WITHOUT CONTRAST TECHNIQUE: Multidetector CT imaging of the cervical spine was performed without intravenous contrast. Multiplanar CT image reconstructions were also generated. RADIATION DOSE REDUCTION: This exam was performed according to the departmental dose-optimization program which includes automated exposure control, adjustment of the mA and/or kV according to patient size and/or use of iterative reconstruction technique. COMPARISON:  None Available. FINDINGS: Alignment: No subluxation Skull base and vertebrae: No acute fracture. No primary bone lesion or focal pathologic process. Soft tissues and spinal canal: No prevertebral fluid or swelling. No visible canal hematoma. Disc levels: Diffuse degenerative disc disease with disc space narrowing and anterior spurring. Bilateral degenerative facet disease. Upper chest: No acute findings.  Biapical scarring. Other: None IMPRESSION: Cervical spondylosis.  No acute bony abnormality. Electronically Signed   By: Charlett Nose M.D.   On: 12/04/2022 00:28   CT HEAD WO CONTRAST ( )  Result Date: 12/04/2022 CLINICAL DATA:  Fall EXAM: CT HEAD WITHOUT CONTRAST TECHNIQUE: Contiguous axial images were obtained from the base of the skull through the vertex without intravenous contrast. RADIATION DOSE REDUCTION: This exam was performed according to the departmental dose-optimization program which includes automated exposure control, adjustment of the mA and/or kV according to patient size and/or use of iterative reconstruction technique. COMPARISON:  None Available. FINDINGS: Brain: There is  atrophy and chronic small vessel disease changes. No acute intracranial abnormality. Specifically, no hemorrhage, hydrocephalus, mass lesion, acute infarction, or significant intracranial injury. Vascular: No hyperdense vessel or unexpected calcification. Skull: No acute calvarial abnormality. Sinuses/Orbits: No acute findings Other: None IMPRESSION: Atrophy, chronic microvascular disease. No acute intracranial abnormality. Electronically Signed   By: Charlett Nose M.D.   On: 12/04/2022 00:26     Data Reviewed: Relevant notes from primary care and specialist visits, past discharge summaries as available in EHR, including Care Everywhere. Prior diagnostic testing as pertinent to current admission diagnoses Updated medications and problem lists for reconciliation ED course, including vitals, labs, imaging, treatment and response to treatment Triage notes, nursing and pharmacy notes and ED provider's notes Notable results as noted in HPI   Assessment and Plan: * Closed fracture of lumbar vertebral body (HCC) Continue home pain meds TLSO brace Neurosurgical consult  Traumatic rhabdomyolysis (HCC) IV hydration and monitor  Atrial fibrillation, chronic (HCC) Continue Xarelto  Chronic pain syndrome Opioid dependence, daily use Patient has both cancer and noncancer related pain Continue MS Contin 15 mg every 12 with oxycodone 10 to 20 mg every 4 hours as needed pain Continue Soma  Esophageal adenocarcinoma (HCC) Bladder cancer on surveillance cystoscopy Patient receives chemo   DVT prophylaxis: Xarelto  Consults: none  Advance Care Planning:   Code Status: Prior   Family Communication: none  Disposition Plan: Back to previous home environment  Severity of Illness: The appropriate patient status for this patient is OBSERVATION. Observation status is judged to be reasonable and necessary in order to provide the required intensity of service to ensure the patient's safety. The  patient's presenting symptoms, physical exam findings, and initial radiographic and laboratory data in the context of their medical condition is felt to place them at decreased risk for further clinical deterioration. Furthermore, it is anticipated that the patient will be medically stable for discharge from the hospital within 2 midnights of admission.   Author: Andris Baumann, MD 12/04/2022 1:51 AM  For on call review [email or web address].

## 2022-12-04 NOTE — ED Notes (Signed)
called forTLSO Brace/ortho tech [facility] stated they will bring it in the am..Marland Kitchen

## 2022-12-04 NOTE — ED Notes (Signed)
[facility] tech unavailable until 0800 today to place TLSO brace

## 2022-12-04 NOTE — ED Notes (Signed)
Skin tears to bil forearms and upper arms cleaned with soap and water, dried- bacitracin, ABD pad, kerlix applied to wounds

## 2022-12-04 NOTE — Progress Notes (Signed)
PROGRESS NOTE    Aaron Short  ZOX:[id number] DOB: March 24, 1946 DOA: 12/03/2022 PCP: Rosemarie Ax, MD  Outpatient Specialists: oncology    Brief Narrative:   Aaron Short is a 77 y.o. male with medical history significant for Atrial fibrillation on Xarelto,  esophageal adenocarcinoma on chemo as well as bladder cancer, chemotherapy-induced diarrhea, chronic pain including cancer related pain on chronic opiatesWho presents to the ED after being found down following a fall.  He [place] the gun range by himself when he reached over to pick up some casings and fell over striking his back.  He held onto the table for support but the table then fell over him.  He was unable to get up and had to crawl along the ground where he remained for 4 hours.  He was previously in his usual state of health.    Assessment & Plan:   Principal Problem:   Closed fracture of lumbar vertebral body (HCC) Active Problems:   Traumatic rhabdomyolysis (HCC)   Opioid dependence, daily use (HCC)   Chronic pain syndrome   Atrial fibrillation, chronic (HCC)   Malignant neoplasm of urinary bladder (HCC)   Hypertension, benign   Esophageal adenocarcinoma (HCC)   # L1 Compression fracture, acute Traumatic (fall). With history of chronic compression fractures and chronic pain. Did complain of some lower extremity weakness yesterday after the fall.  - LSO brace ordered though patient has refused it - neurosurgery to see - assuming no surgery advised next step will be PT/OT consults, possible SNF - pain control as below  # Rhabdomyolysis 2/2 fall and being on ground for several hours. CK 1129. No aki or hyperkalemia - will trend CK and kidney function, electrolytes - gentle fluids  # Paroxysmal a fib Rate controlled - will hold xarelto pending neurosurg consult  # Chronic pain Chronic MSK pain, new cancer-related pain - cont new home regimen of ms contin 15 bid with oxy 10-20 q4 prn - continue  home carisoprodol  # Esophageal cancer Followed by oncology, has had 2 rounds folfox to date, next planned for 7/14 - cont home megace  # MDD - cont home sertraline   DVT prophylaxis: lovenox Code Status: full Family Communication: none @ bedside  Level of care: Med-Surg Status is: Observation    Consultants:  neurosurg  Procedures: None thus far  Antimicrobials:  none    Subjective: Reports low back pain with movement. Says lower extremity weakness improving but legs still feel weak  Objective: Vitals:   12/04/22 0645 12/04/22 0700 12/04/22 0715 12/04/22 0758  BP:  122/60  (!) 118/58  Pulse: 69 60 62 60  Resp:    16  Temp:    97.8 F (36.6 C)  TempSrc:    Oral  SpO2: 99% 100% 97% 100%  Weight:      Height:       No intake or output data in the 24 hours ending 12/04/22 0824 Filed Weights   12/03/22 2326 12/04/22 0327  Weight: 59.9 kg 59.9 kg    Examination:  General exam: Appears calm and comfortable  Respiratory system: Clear to auscultation. Respiratory effort normal. Cardiovascular system: S1 & S2 heard, RRR. No JVD, murmurs, rubs, gallops or clicks.  Gastrointestinal system: Abdomen is nondistended, soft and nontender. No organomegaly or masses felt. Normal bowel sounds heard. Central nervous system: Alert and oriented. Decreased strength lower extremities Extremities: warm, no edema Skin: bandages b/l forearms Psychiatry: Judgement and insight appear normal. Mood & affect appropriate.  Data Reviewed: I have personally reviewed following labs and imaging studies  CBC: Recent Labs  Lab 11/29/22 0802 12/03/22 2344  WBC 7.9 3.3*  NEUTROABS 4.7 1.7  HGB 11.6* 11.2*  HCT 34.0* 33.0*  MCV 95.5 96.2  PLT 200 283   Basic Metabolic Panel: Recent Labs  Lab 11/29/22 0802 12/03/22 2344  NA 136 132*  K 3.8 4.0  CL 98 98  CO2 28 24  GLUCOSE 144* 132*  BUN 21 27*  CREATININE 0.70 0.72  CALCIUM 9.2 8.7*   GFR: Estimated Creatinine  Clearance: 63.4 mL/min (by C-G formula based on SCr of 0.72 mg/dL). Liver Function Tests: Recent Labs  Lab 11/29/22 0802  AST 55*  ALT 25  ALKPHOS 138*  BILITOT 0.4  PROT 6.8  ALBUMIN 3.3*   No results for input(s): "LIPASE", "AMYLASE" in the last 168 hours. No results for input(s): "AMMONIA" in the last 168 hours. Coagulation Profile: No results for input(s): "INR", "PROTIME" in the last 168 hours. Cardiac Enzymes: Recent Labs  Lab 12/03/22 2344  CKTOTAL 1,129*   BNP (last 3 results) No results for input(s): "PROBNP" in the last 8760 hours. HbA1C: No results for input(s): "HGBA1C" in the last 72 hours. CBG: No results for input(s): "GLUCAP" in the last 168 hours. Lipid Profile: No results for input(s): "CHOL", "HDL", "LDLCALC", "TRIG", "CHOLHDL", "LDLDIRECT" in the last 72 hours. Thyroid Function Tests: No results for input(s): "TSH", "T4TOTAL", "FREET4", "T3FREE", "THYROIDAB" in the last 72 hours. Anemia Panel: No results for input(s): "VITAMINB12", "FOLATE", "FERRITIN", "TIBC", "IRON", "RETICCTPCT" in the last 72 hours. Urine analysis:    Component Value Date/Time   COLORURINE AMBER (A) 12/04/2022 0015   APPEARANCEUR HAZY (A) 12/04/2022 0015   APPEARANCEUR CLOUDY 09/14/2013 1214   LABSPEC 1.025 12/04/2022 0015   LABSPEC 1.010 09/14/2013 1214   PHURINE 5.0 12/04/2022 0015   GLUCOSEU NEGATIVE 12/04/2022 0015   GLUCOSEU NEGATIVE 09/14/2013 1214   HGBUR NEGATIVE 12/04/2022 0015   BILIRUBINUR NEGATIVE 12/04/2022 0015   BILIRUBINUR NEGATIVE 09/14/2013 1214   KETONESUR NEGATIVE 12/04/2022 0015   PROTEINUR 100 (A) 12/04/2022 0015   NITRITE NEGATIVE 12/04/2022 0015   LEUKOCYTESUR NEGATIVE 12/04/2022 0015   LEUKOCYTESUR 1+ 09/14/2013 1214   Sepsis Labs: @LABRCNTIP (procalcitonin:4,lacticidven:4)  ) Recent Results (from the past 240 hour(s))  Urine Culture     Status: None   Collection Time: 11/29/22  2:10 PM   Specimen: Urine, Clean Catch  Result Value Ref Range  Status   Specimen Description   Final    URINE, CLEAN CATCH Performed at [name], [address] [place], [place] [address]    Special Requests   Final    NONE Performed at [facility], [address] [place], [place] [address]    Culture   Final    NO GROWTH Performed at [facility], [address], [place], [place] [address]    Report Status 11/30/2022 FINAL  Final         Radiology Studies: CT T-SPINE NO CHARGE  Result Date: 12/04/2022 CLINICAL DATA:  Fall. EXAM: CT THORACIC SPINE WITHOUT CONTRAST TECHNIQUE: Multidetector CT images of the thoracic were obtained using the standard protocol without intravenous contrast. RADIATION DOSE REDUCTION: This exam was performed according to the departmental dose-optimization program which includes automated exposure control, adjustment of the mA and/or kV according to patient size and/or use of iterative reconstruction technique. COMPARISON:  Chest CT today. PET CT 11/13/2022. Outside CT 10/20/2022. FINDINGS: Alignment: Normal Vertebrae: Chronic compression  fractures involving the T12 and L2 vertebral body are stable since prior CT. Mild compression fracture through the superior endplate of L1 and superior endplate of T11 are new since prior study. Paraspinal and other soft tissues: Negative. Disc levels: Diffuse degenerative disc disease. Anterior and lateral spurring. IMPRESSION: Mild compression fractures through the superior endplates of T11 and L1, new since prior CT. Stable chronic mild compression fractures at T12 and L2. Electronically Signed   By: Charlett Nose M.D.   On: 12/04/2022 01:20   CT CHEST WO CONTRAST  Result Date: 12/04/2022 CLINICAL DATA:  fall, eval rib fx, ptx.  Esophageal cancer. EXAM: CT CHEST WITHOUT CONTRAST TECHNIQUE: Multidetector CT imaging of the chest was performed following the standard protocol without IV contrast. RADIATION DOSE REDUCTION: This exam was performed according to the  departmental dose-optimization program which includes automated exposure control, adjustment of the mA and/or kV according to patient size and/or use of iterative reconstruction technique. COMPARISON:  Outside study 10/20/2022.  PET CT 11/13/2022. FINDINGS: Cardiovascular: Coronary artery and aortic calcifications. Heart is normal size. Aorta is normal caliber. Mediastinum/Nodes: No mediastinal, hilar, or axillary adenopathy. Trachea and thyroid unremarkable. Mild distal esophageal wall thickening. Lungs/Pleura: Changes of fibrosis peripherally in the lungs. No acute confluent airspace opacities or effusions. No pneumothorax. Upper Abdomen: Numerous hepatic metastases as seen on prior study. Musculoskeletal: No visible rib fracture. Chronic compression fractures noted at T12 and L2. Mild compression fracture through the superior endplate of L1 is new since prior CT. Chest wall soft tissues unremarkable. IMPRESSION: Mild new compression fracture through the superior endplate of L1. Stable chronic compression fractures at T12 and L2. Coronary artery disease, aortic atherosclerosis. Mild wall thickening in the distal esophagus in the area of non esophageal cancer as seen on prior PET CT. Extensive a patent metastases. Fibrosis peripherally in the lungs.  No pneumothorax or effusion. Aortic Atherosclerosis (ICD10-I70.0). Electronically Signed   By: Charlett Nose M.D.   On: 12/04/2022 00:40   CT Lumbar Spine Wo Contrast  Result Date: 12/04/2022 CLINICAL DATA:  Fall, tailbone pain. EXAM: CT LUMBAR SPINE WITHOUT CONTRAST TECHNIQUE: Multidetector CT imaging of the lumbar spine was performed without intravenous contrast administration. Multiplanar CT image reconstructions were also generated. RADIATION DOSE REDUCTION: This exam was performed according to the departmental dose-optimization program which includes automated exposure control, adjustment of the mA and/or kV according to patient size and/or use of iterative  reconstruction technique. COMPARISON:  PET CT 11/13/2022. outside CT 10/20/2022 FINDINGS: Segmentation: 5 lumbar type vertebrae. Alignment: Normal. Vertebrae: Moderate compression fractures involving T12 and L2. Mild compression fracture through the superior endplate of L1. Paraspinal and other soft tissues: Negative Disc levels: Diffuse degenerative facet disease. Anterior spurring throughout the lower thoracic and lumbar spine. Vacuum disc noted. IMPRESSION: Mild to moderate compression fractures involving T12, L1 and L2 vertebral bodies. The T12 and L2 vertebral bodies are chronic, seen on outside CT from 10/20/2022. The L1 compression fracture is new since that study. Electronically Signed   By: Charlett Nose M.D.   On: 12/04/2022 00:35   CT Cervical Spine Wo Contrast  Result Date: 12/04/2022 CLINICAL DATA:  Fall EXAM: CT CERVICAL SPINE WITHOUT CONTRAST TECHNIQUE: Multidetector CT imaging of the cervical spine was performed without intravenous contrast. Multiplanar CT image reconstructions were also generated. RADIATION DOSE REDUCTION: This exam was performed according to the departmental dose-optimization program which includes automated exposure control, adjustment of the mA and/or kV according to patient size and/or use of iterative reconstruction technique. COMPARISON:  None Available. FINDINGS: Alignment: No subluxation Skull base and vertebrae: No acute fracture. No primary bone lesion or focal pathologic process. Soft tissues and spinal canal: No prevertebral fluid or swelling. No visible canal hematoma. Disc levels: Diffuse degenerative disc disease with disc space narrowing and anterior spurring. Bilateral degenerative facet disease. Upper chest: No acute findings.  Biapical scarring. Other: None IMPRESSION: Cervical spondylosis.  No acute bony abnormality. Electronically Signed   By: Charlett Nose M.D.   On: 12/04/2022 00:28   CT HEAD WO CONTRAST ( )  Result Date: 12/04/2022 CLINICAL DATA:  Fall  EXAM: CT HEAD WITHOUT CONTRAST TECHNIQUE: Contiguous axial images were obtained from the base of the skull through the vertex without intravenous contrast. RADIATION DOSE REDUCTION: This exam was performed according to the departmental dose-optimization program which includes automated exposure control, adjustment of the mA and/or kV according to patient size and/or use of iterative reconstruction technique. COMPARISON:  None Available. FINDINGS: Brain: There is atrophy and chronic small vessel disease changes. No acute intracranial abnormality. Specifically, no hemorrhage, hydrocephalus, mass lesion, acute infarction, or significant intracranial injury. Vascular: No hyperdense vessel or unexpected calcification. Skull: No acute calvarial abnormality. Sinuses/Orbits: No acute findings Other: None IMPRESSION: Atrophy, chronic microvascular disease. No acute intracranial abnormality. Electronically Signed   By: Charlett Nose M.D.   On: 12/04/2022 00:26        Scheduled Meds:  acetaminophen  1,000 mg Oral Once   megestrol  20 mg Oral BID   morphine  15 mg Oral Q12H   rivaroxaban  20 mg Oral Q supper   sertraline  50 mg Oral Daily   Continuous Infusions:  sodium chloride 125 mL/hr at 12/04/22 0339     LOS: 0 days     Silvano Bilis, MD [facility]   If 7PM-7AM, please contact night-coverage [email or web address] Password [email or web address] 12/04/2022, 8:24 AM

## 2022-12-04 NOTE — ED Notes (Signed)
Aaron Short with [employer] arrived to ED/ back brace to be applied

## 2022-12-04 NOTE — Assessment & Plan Note (Signed)
IV hydration and monitor 

## 2022-12-04 NOTE — Assessment & Plan Note (Signed)
Opioid dependence, daily use Patient has both cancer and noncancer related pain Continue MS Contin 15 mg every 12 with oxycodone 10 to 20 mg every 4 hours as needed pain Continue [employer]

## 2022-12-04 NOTE — Evaluation (Signed)
Physical Therapy Evaluation Patient Details Name: Aaron Short MRN: [id number] DOB: 19-Feb-1946 Today's Date: 12/04/2022  History of Present Illness  Pt is a 77 y.o. male presenting to hospital 12/03/22 s/p fall. Per chart "He was at a local gun range by himself.  While reaching over to pick up some casings, patient reports falling over and striking his lower back on the ground.  He tried to use a nearby table to leverage himself to get upright, but reports the table fell onto him when doing so.  He tried to "army crawl" along the ground, but was unable to get upright". Imaging showing mild compression fx's through superior endplates of T11 and L1; stable chronic mild compression fx's at T12 and L2.  Pt admitted with closed fx of lumbar vertebral body, traumatic rhabdomyolysis, and chronic pain syndrome.    PMH includes htn, HLD, DM, esophageal CA on chemo; bladder CA, chemotherapy-inducted diarrhea, chronic pain, B TKA, B shoulder sx, B THR, a-flutter on Xarelto.  Clinical Impression  Prior to recent fall, pt was independent with functional mobility; lives with his wife in 1 level home with 4 STE B railings.  3/10 LBP beginning/end of session (slight increase in pain with activity).  Pt had declined back brace when delivery was attempted so pt does not have any brace currently; pt requesting to try mobility without any brace (neurosurgery reporting back brace is just for comfort).  Currently pt is modified independent semi-supine to sitting edge of bed via logrolling; CGA with transfer using RW; and CGA to ambulate a few feet bed to recliner with RW use (limited distance ambulating d/t pt c/o lightheadedness with standing activities--pt's BP 99/55 sitting in recliner--pt's wife reports that is normal BP for pt--nurse notified of symptoms and BP).  Pt would currently benefit from skilled PT to address noted impairments and functional limitations (see below for any additional details).  Upon hospital  discharge, pt would benefit from ongoing therapy.     Assistance Recommended at Discharge Intermittent Supervision/Assistance  If plan is discharge home, recommend the following:  Can travel by private vehicle  A little help with walking and/or transfers;A little help with bathing/dressing/bathroom;Assistance with cooking/housework;Assist for transportation;Help with stairs or ramp for entrance        Equipment Recommendations Rolling walker (2 wheels);BSC/3in1  Recommendations for Other Services  OT consult    Functional Status Assessment Patient has had a recent decline in their functional status and demonstrates the ability to make significant improvements in function in a reasonable and predictable amount of time.     Precautions / Restrictions Precautions Precautions: Fall;Back Precaution Comments: TLSO brace for comfort per Dr. Katrinka Blazing (Neurosurgery) via secure messaging 12/04/22 Restrictions Weight Bearing Restrictions: No      Mobility  Bed Mobility Overal bed mobility: Modified Independent             General bed mobility comments: semi-supine to sitting via logrolling    Transfers Overall transfer level: Needs assistance Equipment used: Rolling walker (2 wheels) Transfers: Sit to/from Stand Sit to Stand: Min guard           General transfer comment: vc's for UE placement    Ambulation/Gait Ambulation/Gait assistance: Min guard Gait Distance (Feet): 3 Feet (bed to recliner) Assistive device: Rolling walker (2 wheels)   Gait velocity: decreased     General Gait Details: vc's for walker use; steady with Materials engineer  Tilt Bed    Modified Rankin (Stroke Patients Only)       Balance Overall balance assessment: Needs assistance Sitting-balance support: No upper extremity supported, Feet supported Sitting balance-Leahy Scale: Good Sitting balance - Comments: steady reaching within BOS   Standing  balance support: Bilateral upper extremity supported, During functional activity, Reliant on assistive device for balance Standing balance-Leahy Scale: Good Standing balance comment: steady taking steps with RW                             Pertinent Vitals/Pain Pain Assessment Pain Assessment: 0-10 Pain Score: 3  Pain Location: low back Pain Descriptors / Indicators: Aching Pain Intervention(s): Limited activity within patient's tolerance, Monitored during session, Repositioned Vitals (HR and SpO2 on room air) stable and WFL throughout treatment session.    Home Living Family/patient expects to be discharged to:: Private residence Living Arrangements: Spouse/significant other Available Help at Discharge: Family;Available 24 hours/day Type of Home: House Home Access: Stairs to enter Entrance Stairs-Rails: Right;Left;Can reach both Entrance Stairs-Number of Steps: 4 (from garage)   Home Layout: One level Home Equipment: Grab bars - tub/shower;Grab bars - toilet;Cane - single point      Prior Function Prior Level of Function : Independent/Modified Independent             Mobility Comments: 1 other fall in past 6 months (fell in shower)       Hand Dominance        Extremity/Trunk Assessment   Upper Extremity Assessment Upper Extremity Assessment: Overall WFL for tasks assessed    Lower Extremity Assessment Lower Extremity Assessment: Generalized weakness    Cervical / Trunk Assessment Cervical / Trunk Assessment: Kyphotic  Communication   Communication: No difficulties  Cognition Arousal/Alertness: Awake/alert Behavior During Therapy: WFL for tasks assessed/performed Overall Cognitive Status: Within Functional Limits for tasks assessed                                          General Comments General comments (skin integrity, edema, etc.): skin abrasions noted on pt's back (pt/wife report from fall).  Nursing cleared pt for  participation in physical therapy.  Pt agreeable to PT session.  Pt's wife present during session.    Exercises  Spinal precaution education   Assessment/Plan    PT Assessment Patient needs continued PT services  PT Problem List Decreased strength;Decreased activity tolerance;Decreased balance;Decreased mobility;Decreased knowledge of use of DME;Decreased knowledge of precautions;Pain       PT Treatment Interventions DME instruction;Gait training;Stair training;Functional mobility training;Therapeutic activities;Therapeutic exercise;Balance training;Patient/family education    PT Goals (Current goals can be found in the Care Plan section)  Acute Rehab PT Goals Patient Stated Goal: to improve pain and mobility PT Goal Formulation: With patient Time For Goal Achievement: 12/18/22 Potential to Achieve Goals: Good    Frequency Min 1X/week     Co-evaluation               AM-PAC PT "6 Clicks" Mobility  Outcome Measure Help needed turning from your back to your side while in a flat bed without using bedrails?: None Help needed moving from lying on your back to sitting on the side of a flat bed without using bedrails?: A Little Help needed moving to and from a bed to a chair (including a wheelchair)?: A Little Help needed standing  up from a chair using your arms (e.g., wheelchair or bedside chair)?: A Little Help needed to walk in hospital room?: A Little Help needed climbing 3-5 steps with a railing? : A Lot 6 Click Score: 18    End of Session Equipment Utilized During Treatment: Gait belt Activity Tolerance: Patient tolerated treatment well (limited d/t c/o lightheadedness) Patient left: in chair;with call bell/phone within reach;with chair alarm set;with family/visitor present Nurse Communication: Mobility status;Precautions;Other (comment) (pt's lightheadedness and pt's BP; pt's pain status) PT Visit Diagnosis: Other abnormalities of gait and mobility (R26.89);Muscle  weakness (generalized) (M62.81);History of falling (Z91.81);Pain Pain - part of body:  (low back)    Time: 1191-4782 PT Time Calculation (min) (ACUTE ONLY): 32 min   Charges:   PT Evaluation $PT Eval Low Complexity: 1 Low PT Treatments $Therapeutic Activity: 8-22 mins PT General Charges $$ ACUTE PT VISIT: 1 Visit        Hendricks Limes, PT 12/04/22, 4:40 PM

## 2022-12-05 LAB — BASIC METABOLIC PANEL
Anion gap: 8 (ref 5–15)
BUN: 16 mg/dL (ref 8–23)
CO2: 25 mmol/L (ref 22–32)
Calcium: 8 mg/dL — ABNORMAL LOW (ref 8.9–10.3)
Chloride: 100 mmol/L (ref 98–111)
Creatinine, Ser: 0.59 mg/dL — ABNORMAL LOW (ref 0.61–1.24)
GFR, Estimated: >60 mL/min (ref >60)
Glucose, Bld: 85 mg/dL (ref 70–99)
Potassium: 4.2 mmol/L (ref 3.5–5.1)
Sodium: 133 mmol/L — ABNORMAL LOW (ref 135–145)

## 2022-12-05 LAB — CK: Total CK: 1080 U/L — ABNORMAL HIGH (ref 49–397)

## 2022-12-05 NOTE — Discharge Instructions (Signed)
[facility] will provide Home Health PT.  They will start services in 24 to 48 hours.

## 2022-12-05 NOTE — Discharge Summary (Signed)
Aaron Short ZOX:[id number] DOB: 1946/03/05 DOA: 12/03/2022  PCP: Aaron Ax, MD  Admit date: 12/03/2022 Discharge date: 12/05/2022  Time spent: 35 minutes  Recommendations for Outpatient Follow-up:  Pcp f/u Neurosurgery f/u 4 weeks Consider osteoporosis treatment     Discharge Diagnoses:  Principal Problem:   Closed fracture of lumbar vertebral body (HCC) Active Problems:   Traumatic rhabdomyolysis (HCC)   Opioid dependence, daily use (HCC)   Chronic pain syndrome   Atrial fibrillation, chronic (HCC)   Malignant neoplasm of urinary bladder (HCC)   Hypertension, benign   Esophageal adenocarcinoma (HCC)   Discharge Condition: stable  Diet recommendation: heart healthy  Filed Weights   12/03/22 2326 12/04/22 0327  Weight: 59.9 kg 59.9 kg    History of present illness:  From admission h and p Aaron Short is a 77 y.o. male with medical history significant for Atrial fibrillation on Xarelto,  esophageal adenocarcinoma on chemo as well as bladder cancer, chemotherapy-induced diarrhea, chronic pain including cancer related pain on chronic opiatesWho presents to the ED after being found down following a fall.  [place] the gun range by himself when he reached over to pick up some casings and fell over striking his back.  He held onto the table for support but the table then fell over him.  He was unable to get up and had to crawl along the ground where he remained for 4 hours.  He was previously in his usual state of health.   Hospital Course:  Patient presents with mechanical fall resulting in acute L1 compression fracture and mild rhabdomyolysis. For the compression fracture neurosurgery evaluated. No surgical intervention needed. Advised LSO brace prn for comfort, patient declines this. Advises 4 week f/u. PT evaluated and advised home health with rolling walker which we have ordered. Pain is controlled and is on opioids at home for cancer-related pain. Rhabdo  mild with no hyperkalemia or aki and CK improving. Other chronic conditions stable. Discharging home with home health PT/OT, rolling walker (has a bedside commode), neurosurg f/u 4 weeks, and close PCP hospital f/u.   Procedures: none   Consultations: neurosurgery  Discharge Exam: Vitals:   12/04/22 2303 12/05/22 0852  BP: (!) 104/59 122/61  Pulse: (!) 58 63  Resp: 16 18  Temp: 98.2 F (36.8 C) 97.6 F (36.4 C)  SpO2: 97% 98%    General exam: Appears calm and comfortable  Respiratory system: Clear to auscultation. Respiratory effort normal. Cardiovascular system: S1 & S2 heard, RRR. No JVD, murmurs, rubs, gallops or clicks.  Gastrointestinal system: Abdomen is nondistended, soft and nontender. No organomegaly or masses felt. Normal bowel sounds heard. Central nervous system: Alert and oriented. Decreased strength lower extremities Extremities: warm, no edema Skin: bandages b/l forearms Psychiatry: Judgement and insight appear normal. Mood & affect appropriate.     Discharge Instructions   Discharge Instructions     Diet - low sodium heart healthy   Complete by: As directed    Discharge wound care:   Complete by: As directed    Keep clean   Increase activity slowly   Complete by: As directed       Allergies as of 12/05/2022       Reactions   Diltiazem Swelling   Leg edema Leg edema Leg edema Leg edema Leg edema Leg edema Leg edema Leg edema Leg edema   Penicillins Hives, Rash   Had hives as child Other reaction(s): RASH Childhood allergy Other reaction(s): RASH Other reaction(s): RASH Childhood allergy  Other reaction(s): RASH Other reaction(s): RASH Childhood allergy   Tizanidine Other (See Comments)   Other reaction(s): OTHER  Pt states it puts him out Other reaction(s): OTHER  Pt states it puts him out Other reaction(s): OTHER  Pt states it puts him out Other reaction(s): OTHER  Pt states it puts him out Other reaction(s): OTHER  Pt states it  puts him out Other reaction(s): OTHER  Pt states it puts him out        Medication List     STOP taking these medications    ALPRAZolam 0.5 MG tablet Commonly known as: XANAX   rosuvastatin 20 MG tablet Commonly known as: CRESTOR       TAKE these medications    carisoprodol 350 MG tablet Commonly known as: SOMA Take 350 mg by mouth 2 (two) times daily.   Cetirizine HCl 10 MG Caps Take 1 capsule by mouth every morning.   dexamethasone 4 MG tablet Commonly known as: DECADRON Take 2 tablets (8 mg total) by mouth daily. Start the day after chemotherapy for 2 days. Take with food.   lidocaine-prilocaine cream Commonly known as: EMLA Apply to affected area once   loperamide 2 MG capsule Commonly known as: IMODIUM Take 1 capsule (2 mg total) by mouth See admin instructions. Initial: 4 mg,the 2 mg every 2 hours (4 mg every 4 hours at night)  maximum: 16 mg/day   Magnesium 500 MG Tabs Take 500 mg by mouth as needed.   megestrol 20 MG tablet Commonly known as: MEGACE Take 1 tablet (20 mg total) by mouth 2 (two) times daily.   Melatonin Maximum Strength 5 MG Tabs Generic drug: melatonin Take 5 mg by mouth at bedtime as needed.   morphine 15 MG 12 hr tablet Commonly known as: MS CONTIN Take 1 tablet (15 mg total) by mouth every 12 (twelve) hours.   naloxone 4 MG/0.1ML Liqd nasal spray kit Commonly known as: Narcan Place 1 spray into the nose once for 1 dose. Spray half of bottle content into each nostril, then call 911   omeprazole 10 MG capsule Commonly known as: PRILOSEC Take 20 mg by mouth daily.   ondansetron 8 MG tablet Commonly known as: Zofran Take 1 tablet (8 mg total) by mouth every 8 (eight) hours as needed for nausea or vomiting. Start on the third day after chemotherapy.   Oxycodone HCl 10 MG Tabs Take 1-2 tablets (10-20 mg total) by mouth every 4 (four) hours as needed (pain).   prazosin 1 MG capsule Commonly known as: MINIPRESS Take 1 mg by  mouth at bedtime.   prochlorperazine 10 MG tablet Commonly known as: COMPAZINE Take 1 tablet (10 mg total) by mouth every 6 (six) hours as needed for nausea or vomiting.   rivaroxaban 20 MG Tabs tablet Commonly known as: XARELTO Take 20 mg by mouth daily.   sertraline 50 MG tablet Commonly known as: ZOLOFT Take 50 mg by mouth at bedtime.   Vitamin B + C Complex Tabs Take 1 capsule by mouth every morning.               Durable Medical Equipment  (From admission, onward)           Start     Ordered   12/05/22 1047  DME Walker  Once       Question Answer Comment  Walker: With 5 Inch Wheels   Patient needs a walker to treat with the following condition Lumbar compression fracture (HCC)  12/05/22 1046              Discharge Care Instructions  (From admission, onward)           Start     Ordered   12/05/22 0000  Discharge wound care:       Comments: Keep clean   12/05/22 1046           Allergies  Allergen Reactions   Diltiazem Swelling    Leg edema Leg edema Leg edema Leg edema Leg edema Leg edema Leg edema Leg edema Leg edema   Penicillins Hives and Rash    Had hives as child Other reaction(s): RASH Childhood allergy Other reaction(s): RASH Other reaction(s): RASH Childhood allergy Other reaction(s): RASH Other reaction(s): RASH Childhood allergy   Tizanidine Other (See Comments)    Other reaction(s): OTHER  Pt states it puts him out Other reaction(s): OTHER  Pt states it puts him out Other reaction(s): OTHER  Pt states it puts him out Other reaction(s): OTHER  Pt states it puts him out Other reaction(s): OTHER  Pt states it puts him out Other reaction(s): OTHER  Pt states it puts him out    Follow-up Information     Loreen Freud, MD Follow up.   Specialty: Neurosurgery Why: call to schedule an appointment in 4 weeks Contact information: [address] [place] [address] [phone]                   The results of significant diagnostics from this hospitalization (including imaging, microbiology, ancillary and laboratory) are listed below for reference.    Significant Diagnostic Studies: CT T-SPINE NO CHARGE  Result Date: 12/04/2022 CLINICAL DATA:  Fall. EXAM: CT THORACIC SPINE WITHOUT CONTRAST TECHNIQUE: Multidetector CT images of the thoracic were obtained using the standard protocol without intravenous contrast. RADIATION DOSE REDUCTION: This exam was performed according to the departmental dose-optimization program which includes automated exposure control, adjustment of the mA and/or kV according to patient size and/or use of iterative reconstruction technique. COMPARISON:  Chest CT today. PET CT 11/13/2022. Outside CT 10/20/2022. FINDINGS: Alignment: Normal Vertebrae: Chronic compression fractures involving the T12 and L2 vertebral body are stable since prior CT. Mild compression fracture through the superior endplate of L1 and superior endplate of T11 are new since prior study. Paraspinal and other soft tissues: Negative. Disc levels: Diffuse degenerative disc disease. Anterior and lateral spurring. IMPRESSION: Mild compression fractures through the superior endplates of T11 and L1, new since prior CT. Stable chronic mild compression fractures at T12 and L2. Electronically Signed   By: Charlett Nose M.D.   On: 12/04/2022 01:20   CT CHEST WO CONTRAST  Result Date: 12/04/2022 CLINICAL DATA:  fall, eval rib fx, ptx.  Esophageal cancer. EXAM: CT CHEST WITHOUT CONTRAST TECHNIQUE: Multidetector CT imaging of the chest was performed following the standard protocol without IV contrast. RADIATION DOSE REDUCTION: This exam was performed according to the departmental dose-optimization program which includes automated exposure control, adjustment of the mA and/or kV according to patient size and/or use of iterative reconstruction technique. COMPARISON:  Outside study 10/20/2022.  PET CT  11/13/2022. FINDINGS: Cardiovascular: Coronary artery and aortic calcifications. Heart is normal size. Aorta is normal caliber. Mediastinum/Nodes: No mediastinal, hilar, or axillary adenopathy. Trachea and thyroid unremarkable. Mild distal esophageal wall thickening. Lungs/Pleura: Changes of fibrosis peripherally in the lungs. No acute confluent airspace opacities or effusions. No pneumothorax. Upper Abdomen: Numerous hepatic metastases as seen on prior study. Musculoskeletal: No  visible rib fracture. Chronic compression fractures noted at T12 and L2. Mild compression fracture through the superior endplate of L1 is new since prior CT. Chest wall soft tissues unremarkable. IMPRESSION: Mild new compression fracture through the superior endplate of L1. Stable chronic compression fractures at T12 and L2. Coronary artery disease, aortic atherosclerosis. Mild wall thickening in the distal esophagus in the area of non esophageal cancer as seen on prior PET CT. Extensive a patent metastases. Fibrosis peripherally in the lungs.  No pneumothorax or effusion. Aortic Atherosclerosis (ICD10-I70.0). Electronically Signed   By: Charlett Nose M.D.   On: 12/04/2022 00:40   CT Lumbar Spine Wo Contrast  Result Date: 12/04/2022 CLINICAL DATA:  Fall, tailbone pain. EXAM: CT LUMBAR SPINE WITHOUT CONTRAST TECHNIQUE: Multidetector CT imaging of the lumbar spine was performed without intravenous contrast administration. Multiplanar CT image reconstructions were also generated. RADIATION DOSE REDUCTION: This exam was performed according to the departmental dose-optimization program which includes automated exposure control, adjustment of the mA and/or kV according to patient size and/or use of iterative reconstruction technique. COMPARISON:  PET CT 11/13/2022. outside CT 10/20/2022 FINDINGS: Segmentation: 5 lumbar type vertebrae. Alignment: Normal. Vertebrae: Moderate compression fractures involving T12 and L2. Mild compression fracture  through the superior endplate of L1. Paraspinal and other soft tissues: Negative Disc levels: Diffuse degenerative facet disease. Anterior spurring throughout the lower thoracic and lumbar spine. Vacuum disc noted. IMPRESSION: Mild to moderate compression fractures involving T12, L1 and L2 vertebral bodies. The T12 and L2 vertebral bodies are chronic, seen on outside CT from 10/20/2022. The L1 compression fracture is new since that study. Electronically Signed   By: Charlett Nose M.D.   On: 12/04/2022 00:35   CT Cervical Spine Wo Contrast  Result Date: 12/04/2022 CLINICAL DATA:  Fall EXAM: CT CERVICAL SPINE WITHOUT CONTRAST TECHNIQUE: Multidetector CT imaging of the cervical spine was performed without intravenous contrast. Multiplanar CT image reconstructions were also generated. RADIATION DOSE REDUCTION: This exam was performed according to the departmental dose-optimization program which includes automated exposure control, adjustment of the mA and/or kV according to patient size and/or use of iterative reconstruction technique. COMPARISON:  None Available. FINDINGS: Alignment: No subluxation Skull base and vertebrae: No acute fracture. No primary bone lesion or focal pathologic process. Soft tissues and spinal canal: No prevertebral fluid or swelling. No visible canal hematoma. Disc levels: Diffuse degenerative disc disease with disc space narrowing and anterior spurring. Bilateral degenerative facet disease. Upper chest: No acute findings.  Biapical scarring. Other: None IMPRESSION: Cervical spondylosis.  No acute bony abnormality. Electronically Signed   By: Charlett Nose M.D.   On: 12/04/2022 00:28   CT HEAD WO CONTRAST ( )  Result Date: 12/04/2022 CLINICAL DATA:  Fall EXAM: CT HEAD WITHOUT CONTRAST TECHNIQUE: Contiguous axial images were obtained from the base of the skull through the vertex without intravenous contrast. RADIATION DOSE REDUCTION: This exam was performed according to the departmental  dose-optimization program which includes automated exposure control, adjustment of the mA and/or kV according to patient size and/or use of iterative reconstruction technique. COMPARISON:  None Available. FINDINGS: Brain: There is atrophy and chronic small vessel disease changes. No acute intracranial abnormality. Specifically, no hemorrhage, hydrocephalus, mass lesion, acute infarction, or significant intracranial injury. Vascular: No hyperdense vessel or unexpected calcification. Skull: No acute calvarial abnormality. Sinuses/Orbits: No acute findings Other: None IMPRESSION: Atrophy, chronic microvascular disease. No acute intracranial abnormality. Electronically Signed   By: Charlett Nose M.D.   On: 12/04/2022 00:26   NM  Cardiac Muga Rest  Result Date: 11/16/2022 CLINICAL DATA:  baseline heart function for chemo eval EXAM: NUCLEAR MEDICINE CARDIAC BLOOD POOL IMAGING (MUGA) TECHNIQUE: Cardiac multi-gated acquisition was performed at rest following intravenous injection of Tc-32m labeled red blood cells. RADIOPHARMACEUTICALS:  21.1 mCi Tc-50m pertechnetate in-vitro labeled red blood cells IV COMPARISON:  PET-CT, 11/13/2022 CTA chest 10/20/2022 FINDINGS: LEFT ventricular wall normal in size and demonstrate normal motion. Ventricular cavities normal. Calculated LEFT ejection fraction is calculated at 58%. No prior comparison MPI or MUGA. IMPRESSION: 1. Normal LEFT ventricular wall motion. 2. Calculated LVEF 58% Electronically Signed   By: Roanna Banning M.D.   On: 11/16/2022 16:02   IR IMAGING GUIDED PORT INSERTION  Result Date: 11/13/2022 INDICATION: Esophageal adenocarcinoma. Patient presents for port catheter placement. EXAM: IMPLANTED PORT A CATH PLACEMENT WITH ULTRASOUND AND FLUOROSCOPIC GUIDANCE MEDICATIONS: None. ANESTHESIA/SEDATION: Versed 1 mg IV; Fentanyl 75 mcg IV; Moderate Sedation Time:  14 minutes The patient's vital signs and level of consciousness were continuously monitored during the procedure  by the interventional radiology nurse under my direct supervision. FLUOROSCOPY: Radiation exposure index: 1 mGy reference air kerma COMPLICATIONS: None immediate. PROCEDURE: The right neck and chest was prepped with chlorhexidine, and draped in the usual sterile fashion using maximum barrier technique (cap and mask, sterile gown, sterile gloves, large sterile sheet, hand hygiene and cutaneous antiseptic). Local anesthesia was attained by infiltration with 1% lidocaine with epinephrine. Ultrasound demonstrated patency of the right internal jugular vein, and this was documented with an image. Under real-time ultrasound guidance, this vein was accessed with a 21 gauge micropuncture needle and image documentation was performed. A small dermatotomy was made at the access site with an 11 scalpel. A 0.018" wire was advanced into the SVC and the access needle exchanged for a 31F micropuncture vascular sheath. The 0.018" wire was then removed and a 0.035" wire advanced into the IVC. An appropriate location for the subcutaneous reservoir was selected below the clavicle and an incision was made through the skin and underlying soft tissues. The subcutaneous tissues were then dissected using a combination of blunt and sharp surgical technique and a pocket was formed. A Bard ISP single lumen power injectable portacatheter was then tunneled through the subcutaneous tissues from the pocket to the dermatotomy and the port reservoir placed within the subcutaneous pocket. The venous access site was then serially dilated and a peel away vascular sheath placed over the wire. The wire was removed and the port catheter advanced into position under fluoroscopic guidance. The catheter tip is positioned in the superior cavoatrial junction. This was documented with a spot image. The portacatheter was then tested and found to flush and aspirate well. The port was flushed with saline followed by 100 units/mL heparinized saline. The pocket was  then closed in two layers using first subdermal inverted interrupted absorbable sutures followed by a running subcuticular suture. The epidermis was then sealed with Dermabond. The dermatotomy at the venous access site was also closed with Dermabond. IMPRESSION: Successful placement of a right IJ approach Power Port with ultrasound and fluoroscopic guidance. The catheter is ready for use. Electronically Signed   By: Malachy Moan M.D.   On: 11/13/2022 15:27   NM PET Image Initial (PI) Skull Base To Thigh  Result Date: 11/13/2022 CLINICAL DATA:  Initial treatment strategy for esophageal carcinoma. EXAM: NUCLEAR MEDICINE PET SKULL BASE TO THIGH TECHNIQUE: 7.65 mCi F-18 FDG was injected intravenously. Full-ring PET imaging was performed from the skull base to thigh after  the radiotracer. CT data was obtained and used for attenuation correction and anatomic localization. Fasting blood glucose: 93 mg/dl COMPARISON:  CT 78/29/5621 FINDINGS: Mediastinal blood pool activity: SUV max 1.39 Liver activity: SUV max NA NECK: No hypermetabolic lymph nodes in the neck. Incidental CT findings: None. CHEST: Tracer avid tumor within the distal esophagus has an SUV max 9.97 with corresponding mild circumferential wall thickening on the CT images. No tracer avid mediastinal or hilar lymph nodes. Tiny nodule within the medial left lower lobe is too small to characterize by PET-CT measuring 4 mm with SUV max 1.26, image 59/4. Incidental CT findings: Signs of chronic postinflammatory lung disease identified with bilateral peripheral interstitial reticulation. Aortic atherosclerosis with coronary artery calcifications. ABDOMEN/PELVIS: Extensive tracer avid liver metastases are identified throughout both lobes of the liver. Lesions are too numerous to count. Reference lesions include: -within segment 7/8 right hepatic lobe lesion measures 8.8 x 7.1 cm within SUV max of 30.72, image 72/4. -within segment 3 lesion measures 3.6 x 3.0  cm with SUV max of 25.08, image 88/4. -within segment 6 lesion measures 5.4 x 4.2 cm with SUV max 30.95, image 100/4. Tracer avid tumor extending from the distal esophagus into the cardia has an SUV max 14.45, image 75/4. No tracer avid abdominopelvic lymph nodes. Incidental CT findings: Aortic atherosclerosis. Small gallstones identified. SKELETON: No focal hypermetabolic activity to suggest skeletal metastasis. Incidental CT findings: There is asymmetric increased uptake localizing to the right first rib costosternal junction with SUV max 5.78, image 48/4. Asymmetric uptake localizing to the left [facility] joint is also noted. Status post bilateral hip arthroplasty. IMPRESSION: 1. Tracer avid tumor is identified within the distal esophagus extending into the cardia. 2. Extensive tracer avid liver metastases. 3. Tiny nodule within the medial left lower lobe is too small to characterize by PET-CT measuring 4 mm. Attention to this nodule on future surveillance imaging is advised. 4. Asymmetric uptake localizing to the right first rib costosternal junction and left AC joint is noted. Findings are favored to represent arthropathic change 5. Gallstones. 6.  Aortic Atherosclerosis (ICD10-I70.0). Electronically Signed   By: Signa Kell M.D.   On: 11/13/2022 10:20   DG SWALLOW FUNC OP MEDICARE SPEECH PATH  Result Date: 11/09/2022 CLINICAL DATA:  Provided history: Cough, unspecified type. Dysphagia, unspecified type. EXAM: MODIFIED BARIUM SWALLOW TECHNIQUE: Different consistencies of barium were administered orally to the patient by the speech pathologist. Imaging of the pharynx was performed in the lateral projection. Mina Marble, PA-C (supervised by Dr. Jackey Loge) was present in the fluoroscopy room for the study, and operated the fluoroscopy equipment. FLUOROSCOPY: Radiation Exposure Index (as provided by the fluoroscopic device): 6.70 mGy Kerma COMPARISON:  None. FINDINGS: Different consistencies of barium were  administered orally to the patient by the speech pathologist with fluoroscopic imaging of the pharynx from a lateral projection. Mina Marble, PA-C (supervised by Dr. Jackey Loge) was present in the fluoroscopy room and operated the fluoroscopy equipment. The speech pathologist observed aspiration with thin and nectar consistencies. Please refer to the speech pathologist's report for full details. Bridging ventral cervical osteophyte noted at C4-C5. IMPRESSION: 1. Modified barium swallow as described. 2. Aspiration was observed with thin and nectar consistencies. Please refer to the speech pathologist's report for complete details and recommendations. Electronically Signed   By: Jackey Loge D.O.   On: 11/09/2022 14:06    Microbiology: Recent Results (from the past 240 hour(s))  Urine Culture     Status: None   Collection  Time: 11/29/22  2:10 PM   Specimen: Urine, Clean Catch  Result Value Ref Range Status   Specimen Description   Final    URINE, CLEAN CATCH Performed at [facility], [address] [place], [place] [address]    Special Requests   Final    NONE Performed at [facility], [address] [place], [place] [address]    Culture   Final    NO GROWTH Performed at [facility], [address] [place], [place] [address]    Report Status 11/30/2022 FINAL  Final     Labs: Basic Metabolic Panel: Recent Labs  Lab 11/29/22 0802 12/03/22 2344 12/04/22 1015 12/05/22 0530  NA 136 132* 133* 133*  K 3.8 4.0 4.0 4.2  CL 98 98 98 100  CO2 28 24 27 25   GLUCOSE 144* 132* 93 85  BUN 21 27* 21 16  CREATININE 0.70 0.72 0.58* 0.59*  CALCIUM 9.2 8.7* 8.5* 8.0*   Liver Function Tests: Recent Labs  Lab 11/29/22 0802  AST 55*  ALT 25  ALKPHOS 138*  BILITOT 0.4  PROT 6.8  ALBUMIN 3.3*   No results for input(s): "LIPASE", "AMYLASE" in the last 168 hours. No results for input(s): "AMMONIA" in the last 168 hours. CBC: Recent Labs  Lab 11/29/22 0802  12/03/22 2344  WBC 7.9 3.3*  NEUTROABS 4.7 1.7  HGB 11.6* 11.2*  HCT 34.0* 33.0*  MCV 95.5 96.2  PLT 200 283   Cardiac Enzymes: Recent Labs  Lab 12/03/22 2344 12/04/22 1015 12/05/22 0530  CKTOTAL 1,129* 1,941* 1,080*   BNP: BNP (last 3 results) No results for input(s): "BNP" in the last 8760 hours.  ProBNP (last 3 results) No results for input(s): "PROBNP" in the last 8760 hours.  CBG: No results for input(s): "GLUCAP" in the last 168 hours.     Signed:  Silvano Bilis MD.  [facility] 12/05/2022, 10:47 AM

## 2022-12-05 NOTE — Progress Notes (Signed)
Physical Therapy Treatment Patient Details Name: Aaron Short MRN: [id number] DOB: 1945/07/05 Today's Date: 12/05/2022   History of Present Illness Pt is a 77 y.o. male presenting to hospital 12/03/22 s/p fall. Per chart "He was at a local gun range by himself.  While reaching over to pick up some casings, patient reports falling over and striking his lower back on the ground.  He tried to use a nearby table to leverage himself to get upright, but reports the table fell onto him when doing so.  He tried to "army crawl" along the ground, but was unable to get upright". Imaging showing mild compression fx's through superior endplates of T11 and L1; stable chronic mild compression fx's at T12 and L2.  Pt admitted with closed fx of lumbar vertebral body, traumatic rhabdomyolysis, and chronic pain syndrome.    PMH includes htn, HLD, DM, esophageal CA on chemo; bladder CA, chemotherapy-inducted diarrhea, chronic pain, B TKA, B shoulder sx, B THR, a-flutter on Xarelto.    PT Comments  Pt resting in recliner upon PT arrival; agreeable to therapy.  3-4/10 low back pain throughout session.  During session pt SBA with transfers; CGA progressing to SBA ambulating with RW in hallway; and CGA navigating 4 steps with B UE support on railings.  Pt steady and safe with functional mobility using RW during session.  Pt able to verbalize and maintain spinal precautions during session.  Pt planning for HHPT.     Assistance Recommended at Discharge Intermittent Supervision/Assistance  If plan is discharge home, recommend the following:  Can travel by private vehicle    A little help with walking and/or transfers;A little help with bathing/dressing/bathroom;Assistance with cooking/housework;Assist for transportation;Help with stairs or ramp for entrance      Equipment Recommendations  Rolling walker (2 wheels);BSC/3in1    Recommendations for Other Services       Precautions / Restrictions  Precautions Precautions: Fall;Back Precaution Comments: TLSO brace for comfort per Dr. Katrinka Blazing (Neurosurgery) via secure messaging 12/04/22 Restrictions Weight Bearing Restrictions: No     Mobility  Bed Mobility               General bed mobility comments: Deferred (pt in recliner beginning/end of session)    Transfers Overall transfer level: Needs assistance Equipment used: Rolling walker (2 wheels) Transfers: Sit to/from Stand Sit to Stand: Supervision           General transfer comment: steady transfer from recliner    Ambulation/Gait Ambulation/Gait assistance: Min guard, Supervision Gait Distance (Feet): 320 Feet Assistive device: Rolling walker (2 wheels) Gait Pattern/deviations: Step-through pattern Gait velocity: decreased     General Gait Details: steady ambulating with RW use; forward flexed trunk posture noted (pt reports baseline)   Stairs Stairs: Yes Stairs assistance: Min guard Stair Management: Two rails, Forwards Number of Stairs: 4 General stair comments: alternating pattern ascending steps; step to pattern descending steps; steady and safe   Wheelchair Mobility     Tilt Bed    Modified Rankin (Stroke Patients Only)       Balance Overall balance assessment: Needs assistance Sitting-balance support: No upper extremity supported, Feet supported Sitting balance-Leahy Scale: Good Sitting balance - Comments: steady reaching within BOS   Standing balance support: Bilateral upper extremity supported, During functional activity, Reliant on assistive device for balance Standing balance-Leahy Scale: Good Standing balance comment: steady ambulating with RW use  Cognition Arousal/Alertness: Awake/alert Behavior During Therapy: WFL for tasks assessed/performed Overall Cognitive Status: Within Functional Limits for tasks assessed                                          Exercises       General Comments  Nursing cleared pt for participation in physical therapy.  Pt agreeable to PT session.      Pertinent Vitals/Pain Pain Assessment Pain Assessment: 0-10 Pain Score: 4  Pain Location: low back Pain Descriptors / Indicators: Aching Pain Intervention(s): Limited activity within patient's tolerance, Monitored during session, Repositioned Vitals (HR and SpO2 on room air) stable and WFL throughout treatment session.    Home Living        Prior Function            PT Goals (current goals can now be found in the care plan section) Acute Rehab PT Goals Patient Stated Goal: to improve pain and mobility PT Goal Formulation: With patient Time For Goal Achievement: 12/18/22 Potential to Achieve Goals: Good Progress towards PT goals: Progressing toward goals    Frequency    Min 1X/week      PT Plan Current plan remains appropriate    Co-evaluation              AM-PAC PT "6 Clicks" Mobility   Outcome Measure  Help needed turning from your back to your side while in a flat bed without using bedrails?: None Help needed moving from lying on your back to sitting on the side of a flat bed without using bedrails?: A Little Help needed moving to and from a bed to a chair (including a wheelchair)?: A Little Help needed standing up from a chair using your arms (e.g., wheelchair or bedside chair)?: A Little Help needed to walk in hospital room?: A Little Help needed climbing 3-5 steps with a railing? : A Little 6 Click Score: 19    End of Session Equipment Utilized During Treatment: Gait belt Activity Tolerance: Patient tolerated treatment well;No increased pain Patient left: in chair;with call bell/phone within reach;with chair alarm set Nurse Communication: Mobility status;Precautions PT Visit Diagnosis: Other abnormalities of gait and mobility (R26.89);Muscle weakness (generalized) (M62.81);History of falling (Z91.81);Pain Pain - part of body:  (low back)      Time: 1610-9604 PT Time Calculation (min) (ACUTE ONLY): 15 min  Charges:    $Gait Training: 8-22 mins PT General Charges $$ ACUTE PT VISIT: 1 Visit                     Hendricks Limes, PT 12/05/22, 1:59 PM

## 2022-12-05 NOTE — TOC Transition Note (Signed)
Transition of Care [facility]) - CM/SW Discharge Note   Patient Details  Name: Aaron Short MRN: [id number] Date of Birth: June 16, 1945  Transition of Care [facility]) CM/SW Contact:  Garret Reddish, RN Phone Number: 12/05/2022, 12:54 PM   Clinical Narrative:   Chart reviewed. Noted that patient has orders for discharge today.    Noted that patient PT recommended home health PT on discharge. I have spoken with patient and his wife about home health services.  They did not have a home health preference.  I have asked [facility] to accept home health referral.  Patient will need home health PT on discharge.  [facility] will start services in 24-48 hours.    I have asked [employer] to provide patient with a 2 wheeled rolling walker.  Patient has declined a BSC.    Patient's wife will transport patient home today.   I have informed staff nurse of the above information.      Final next level of care: Home w Home Health Services Barriers to Discharge: No Barriers Identified   Patient Goals and CMS Choice CMS Medicare.gov Compare Post Acute Care list provided to:: Patient Choice offered to / list presented to : Patient  Discharge Placement                    Name of family member notified: [name] Patient and family notified of of transfer: 12/05/22  Discharge Plan and Services Additional resources added to the After Visit Summary for                  DME Arranged: Walker rolling DME Agency: [employer] Date DME Agency Contacted: 12/05/22 Time DME Agency Contacted: 1000 Representative spoke with at DME Agency: Esperanza Richters Arranged: PT HH Agency: [facility] Date [facility] Agency Contacted: 12/05/22 Time HH Agency Contacted: 1100 Representative spoke with at [facility] Agency: [place]  Social Determinants of Health (SDOH) Interventions SDOH Screenings   Food Insecurity: No Food Insecurity (12/04/2022)  Housing: Low Risk  (12/04/2022)  Transportation Needs: No Transportation  Needs (12/04/2022)  Utilities: Not At Risk (12/04/2022)  Depression (PHQ2-9): Low Risk  (10/12/2022)  Financial Resource Strain: Low Risk  (11/03/2022)  Stress: No Stress Concern Present (11/03/2022)  Tobacco Use: High Risk (12/04/2022)     Readmission Risk Interventions     No data to display

## 2022-12-05 NOTE — Evaluation (Signed)
Occupational Therapy Evaluation Patient Details Name: Aaron Short MRN: [id number] DOB: 09/26/1945 Today's Date: 12/05/2022   History of Present Illness Pt is a 77 y.o. male presenting to hospital 12/03/22 s/p fall. Per chart "He was at a local gun range by himself.  While reaching over to pick up some casings, patient reports falling over and striking his lower back on the ground.  He tried to use a nearby table to leverage himself to get upright, but reports the table fell onto him when doing so.  He tried to "army crawl" along the ground, but was unable to get upright". Imaging showing mild compression fx's through superior endplates of T11 and L1; stable chronic mild compression fx's at T12 and L2.  Pt admitted with closed fx of lumbar vertebral body, traumatic rhabdomyolysis, and chronic pain syndrome.    PMH includes htn, HLD, DM, esophageal CA on chemo; bladder CA, chemotherapy-inducted diarrhea, chronic pain, B TKA, B shoulder sx, B THR, a-flutter on Xarelto.   Clinical Impression   Pt was seen for OT evaluation this date. Prior to hospital admission, pt was independent at home and in the community. Pt lives with wife who is able to provide assistance as needed. Marland Kitchen Pt presents to acute OT demonstrating impaired ADL performance and functional mobility 2/2 decreased ROM and pain (See OT problem list for additional functional deficits). Pt currently requires Supervision for bed mobility to maintain rolling as apart of spinal Pt educated and return demonstrated appropriate performance during ADL tasks while maintaining precautions. Hand out provided. CGA for transfers with RW. Do not anticipate the need for follow up OT services upon acute hospital DC. Pt does not require further acute skilled OT services. OT will sign off.        Recommendations for follow up therapy are one component of a multi-disciplinary discharge planning process, led by the attending physician.  Recommendations may be  updated based on patient status, additional functional criteria and insurance authorization.   Assistance Recommended at Discharge PRN  Patient can return home with the following A little help with walking and/or transfers;A little help with bathing/dressing/bathroom;Assistance with cooking/housework    Functional Status Assessment  Patient has had a recent decline in their functional status and demonstrates the ability to make significant improvements in function in a reasonable and predictable amount of time.  Equipment Recommendations  None recommended by OT    Recommendations for Other Services       Precautions / Restrictions Precautions Precautions: Fall;Back Precaution Comments: TLSO brace for comfort per Dr. Katrinka Blazing (Neurosurgery) via secure messaging 12/04/22 Restrictions Weight Bearing Restrictions: No      Mobility Bed Mobility Overal bed mobility: Needs Assistance Bed Mobility: Rolling, Sidelying to Sit, Sit to Sidelying Rolling: Supervision Sidelying to sit: Supervision Supine to sit: Supervision Sit to supine: Supervision Sit to sidelying: Supervision General bed mobility comments: Supervision to ensure appropriate log rolling form in ligh of precautions.    Transfers Overall transfer level: Needs assistance Equipment used: Rolling walker (2 wheels) Transfers: Sit to/from Stand Sit to Stand: Min guard           General transfer comment: vc's for UE placement and safe use of RW      Balance Overall balance assessment: Needs assistance Sitting-balance support: No upper extremity supported, Feet supported Sitting balance-Leahy Scale: Good Sitting balance - Comments: steady reaching within BOS - educated on avoiding bending and twisting while performing ADL tasks   Standing balance support: No upper extremity supported, During  functional activity Standing balance-Leahy Scale: Good Standing balance comment: Held onto counter and RW infrequently to maintain  balance.                           ADL either performed or assessed with clinical judgement   ADL Overall ADL's : Needs assistance/impaired                     Lower Body Dressing: Minimal assistance;Sit to/from stand Lower Body Dressing Details (indicate cue type and reason): MIN A to thread clothing over feet to maintain precautions             Functional mobility during ADLs: Min guard;Rolling walker (2 wheels) General ADL Comments: Aniticipate Supervision - MIN A for ADL completion to maintain spinal precautions.     Vision         Perception     Praxis      Pertinent Vitals/Pain Pain Assessment Pain Assessment: Faces Faces Pain Scale: Hurts a little bit Pain Location: low back Pain Descriptors / Indicators: Aching Pain Intervention(s): Monitored during session     Hand Dominance Right   Extremity/Trunk Assessment Upper Extremity Assessment Upper Extremity Assessment: Overall WFL for tasks assessed   Lower Extremity Assessment Lower Extremity Assessment: Overall WFL for tasks assessed   Cervical / Trunk Assessment Cervical / Trunk Assessment: Kyphotic   Communication Communication Communication: No difficulties   Cognition Arousal/Alertness: Awake/alert Behavior During Therapy: WFL for tasks assessed/performed Overall Cognitive Status: Within Functional Limits for tasks assessed                                       General Comments       Exercises     Shoulder Instructions      Home Living Family/patient expects to be discharged to:: Private residence Living Arrangements: Spouse/significant other Available Help at Discharge: Family;Available 24 hours/day Type of Home: House Home Access: Stairs to enter [employer] of Steps: 4 (from garage) Entrance Stairs-Rails: Right;Left;Can reach both Home Layout: One level     Bathroom Shower/Tub: Producer, television/film/video: Handicapped  height Bathroom Accessibility: Yes How Accessible: Accessible via walker Home Equipment: Grab bars - tub/shower;Grab bars - toilet;Cane - single point          Prior Functioning/Environment Prior Level of Function : Independent/Modified Independent             Mobility Comments: 1 other fall in past 6 months (fell in shower)          OT Problem List: Decreased range of motion;Decreased knowledge of precautions;Pain      OT Treatment/Interventions:      OT Goals(Current goals can be found in the care plan section) Acute Rehab OT Goals Patient Stated Goal: To go home OT Goal Formulation: With patient Time For Goal Achievement: 12/19/22 Potential to Achieve Goals: Good  OT Frequency:      Co-evaluation              AM-PAC OT "6 Clicks" Daily Activity     Outcome Measure Help from another person eating meals?: None Help from another person taking care of personal grooming?: A Little Help from another person toileting, which includes using toliet, bedpan, or urinal?: A Little Help from another person bathing (including washing, rinsing, drying)?: A Little Help from another person to put  on and taking off regular upper body clothing?: A Little Help from another person to put on and taking off regular lower body clothing?: A Little 6 Click Score: 19   End of Session Equipment Utilized During Treatment: Rolling walker (2 wheels)  Activity Tolerance: Patient tolerated treatment well Patient left: in chair;with call bell/phone within reach  OT Visit Diagnosis: Other abnormalities of gait and mobility (R26.89)                Time: 1610-9604 OT Time Calculation (min): 25 min Charges:  OT General Charges $OT Visit: 1 Visit OT Evaluation $OT Eval Low Complexity: 1 Low OT Treatments $Self Care/Home Management : 8-22 mins Thresa Ross, OTS

## 2022-12-08 ENCOUNTER — Ambulatory Visit: Payer: Medicare Other | Admitting: Oncology

## 2022-12-08 ENCOUNTER — Other Ambulatory Visit: Payer: Medicare Other

## 2022-12-08 ENCOUNTER — Ambulatory Visit: Payer: Medicare Other

## 2022-12-13 ENCOUNTER — Other Ambulatory Visit: Payer: Medicare Other

## 2022-12-13 ENCOUNTER — Inpatient Hospital Stay: Payer: Medicare Other

## 2022-12-13 ENCOUNTER — Encounter: Payer: Self-pay | Admitting: Oncology

## 2022-12-13 ENCOUNTER — Ambulatory Visit: Payer: Medicare Other | Admitting: Oncology

## 2022-12-13 ENCOUNTER — Encounter: Payer: Medicare Other | Admitting: Hospice and Palliative Medicine

## 2022-12-13 ENCOUNTER — Inpatient Hospital Stay: Payer: Medicare Other | Admitting: Oncology

## 2022-12-13 ENCOUNTER — Ambulatory Visit: Payer: Medicare Other

## 2022-12-13 ENCOUNTER — Telehealth: Payer: Medicare Other | Admitting: Hospice and Palliative Medicine

## 2022-12-13 ENCOUNTER — Inpatient Hospital Stay: Payer: Medicare Other | Admitting: Hospice and Palliative Medicine

## 2022-12-13 VITALS — BP 116/57 | HR 64 | Temp 97.9°F | Resp 18 | Wt 135.5 lb

## 2022-12-13 DIAGNOSIS — S32000A Wedge compression fracture of unspecified lumbar vertebra, initial encounter for closed fracture: Secondary | ICD-10-CM | POA: Insufficient documentation

## 2022-12-13 DIAGNOSIS — R300 Dysuria: Secondary | ICD-10-CM

## 2022-12-13 DIAGNOSIS — E46 Unspecified protein-calorie malnutrition: Secondary | ICD-10-CM

## 2022-12-13 DIAGNOSIS — C159 Malignant neoplasm of esophagus, unspecified: Secondary | ICD-10-CM

## 2022-12-13 DIAGNOSIS — G893 Neoplasm related pain (acute) (chronic): Secondary | ICD-10-CM

## 2022-12-13 DIAGNOSIS — K521 Toxic gastroenteritis and colitis: Secondary | ICD-10-CM

## 2022-12-13 DIAGNOSIS — T451X5D Adverse effect of antineoplastic and immunosuppressive drugs, subsequent encounter: Secondary | ICD-10-CM

## 2022-12-13 DIAGNOSIS — S32000D Wedge compression fracture of unspecified lumbar vertebra, subsequent encounter for fracture with routine healing: Secondary | ICD-10-CM

## 2022-12-13 DIAGNOSIS — T451X5A Adverse effect of antineoplastic and immunosuppressive drugs, initial encounter: Secondary | ICD-10-CM

## 2022-12-13 DIAGNOSIS — C787 Secondary malignant neoplasm of liver and intrahepatic bile duct: Secondary | ICD-10-CM

## 2022-12-13 DIAGNOSIS — C7802 Secondary malignant neoplasm of left lung: Secondary | ICD-10-CM | POA: Diagnosis not present

## 2022-12-13 DIAGNOSIS — Z5112 Encounter for antineoplastic immunotherapy: Secondary | ICD-10-CM | POA: Diagnosis not present

## 2022-12-13 LAB — CBC WITH DIFFERENTIAL (CANCER CENTER ONLY)
Abs Immature Granulocytes: 0.02 10*3/uL (ref 0.00–0.07)
Basophils Absolute: 0.1 10*3/uL (ref 0.0–0.1)
Basophils Relative: 1 %
Eosinophils Absolute: 0.1 10*3/uL (ref 0.0–0.5)
Eosinophils Relative: 1 %
HCT: 31 % — ABNORMAL LOW (ref 39.0–52.0)
Hemoglobin: 10.7 g/dL — ABNORMAL LOW (ref 13.0–17.0)
Immature Granulocytes: 0 %
Lymphocytes Relative: 18 %
Lymphs Abs: 1.7 10*3/uL (ref 0.7–4.0)
MCH: 32.8 pg (ref 26.0–34.0)
MCHC: 34.5 g/dL (ref 30.0–36.0)
MCV: 95.1 fL (ref 80.0–100.0)
Monocytes Absolute: 1.1 10*3/uL — ABNORMAL HIGH (ref 0.1–1.0)
Monocytes Relative: 12 %
Neutro Abs: 6.1 10*3/uL (ref 1.7–7.7)
Neutrophils Relative %: 68 %
Platelet Count: 231 10*3/uL (ref 150–400)
RBC: 3.26 MIL/uL — ABNORMAL LOW (ref 4.22–5.81)
RDW: 15.3 % (ref 11.5–15.5)
WBC Count: 9 10*3/uL (ref 4.0–10.5)
nRBC: 0 % (ref 0.0–0.2)

## 2022-12-13 LAB — CMP (CANCER CENTER ONLY)
ALT: 33 U/L (ref 0–44)
AST: 48 U/L — ABNORMAL HIGH (ref 15–41)
Albumin: 3.1 g/dL — ABNORMAL LOW (ref 3.5–5.0)
Alkaline Phosphatase: 121 U/L (ref 38–126)
Anion gap: 6 (ref 5–15)
BUN: 17 mg/dL (ref 8–23)
CO2: 26 mmol/L (ref 22–32)
Calcium: 8.6 mg/dL — ABNORMAL LOW (ref 8.9–10.3)
Chloride: 101 mmol/L (ref 98–111)
Creatinine: 0.63 mg/dL (ref 0.61–1.24)
GFR, Estimated: >60 mL/min (ref >60)
Glucose, Bld: 127 mg/dL — ABNORMAL HIGH (ref 70–99)
Potassium: 3.9 mmol/L (ref 3.5–5.1)
Sodium: 133 mmol/L — ABNORMAL LOW (ref 135–145)
Total Bilirubin: 0.5 mg/dL (ref 0.3–1.2)
Total Protein: 6.4 g/dL — ABNORMAL LOW (ref 6.5–8.1)

## 2022-12-13 MED ORDER — TRASTUZUMAB-ANNS CHEMO 150 MG IV SOLR
4.0000 mg/kg | Freq: Once | INTRAVENOUS | Status: AC
  Administered 2022-12-13: 252 mg via INTRAVENOUS
  Filled 2022-12-13: qty 12

## 2022-12-13 MED ORDER — SODIUM CHLORIDE 0.9 % IV SOLN
2400.0000 mg/m2 | INTRAVENOUS | Status: DC
  Administered 2022-12-13: 4150 mg via INTRAVENOUS
  Filled 2022-12-13: qty 83

## 2022-12-13 MED ORDER — PALONOSETRON HCL INJECTION 0.25 MG/5ML
0.2500 mg | Freq: Once | INTRAVENOUS | Status: AC
  Administered 2022-12-13: 0.25 mg via INTRAVENOUS
  Filled 2022-12-13: qty 5

## 2022-12-13 MED ORDER — SODIUM CHLORIDE 0.9 % IV SOLN
Freq: Once | INTRAVENOUS | Status: AC
  Filled 2022-12-13: qty 250

## 2022-12-13 MED ORDER — SODIUM CHLORIDE 0.9 % IV SOLN
10.0000 mg | Freq: Once | INTRAVENOUS | Status: AC
  Administered 2022-12-13: 10 mg via INTRAVENOUS
  Filled 2022-12-13: qty 10

## 2022-12-13 MED ORDER — DIPHENHYDRAMINE HCL 25 MG PO CAPS
50.0000 mg | ORAL_CAPSULE | Freq: Once | ORAL | Status: AC
  Administered 2022-12-13: 50 mg via ORAL
  Filled 2022-12-13: qty 2

## 2022-12-13 MED ORDER — ACETAMINOPHEN 325 MG PO TABS
650.0000 mg | ORAL_TABLET | Freq: Once | ORAL | Status: AC
  Administered 2022-12-13: 650 mg via ORAL
  Filled 2022-12-13: qty 2

## 2022-12-13 MED ORDER — OXALIPLATIN CHEMO INJECTION 100 MG/20ML
70.0000 mg/m2 | Freq: Once | INTRAVENOUS | Status: AC
  Administered 2022-12-13: 120 mg via INTRAVENOUS
  Filled 2022-12-13: qty 20

## 2022-12-13 MED ORDER — LEUCOVORIN CALCIUM INJECTION 350 MG
400.0000 mg/m2 | Freq: Once | INTRAVENOUS | Status: AC
  Administered 2022-12-13: 692 mg via INTRAVENOUS
  Filled 2022-12-13: qty 34.6

## 2022-12-13 MED ORDER — DEXTROSE 5 % IV SOLN
Freq: Once | INTRAVENOUS | Status: AC
  Filled 2022-12-13: qty 250

## 2022-12-13 MED ORDER — FLUOROURACIL CHEMO INJECTION 2.5 GM/50ML
400.0000 mg/m2 | Freq: Once | INTRAVENOUS | Status: AC
  Administered 2022-12-13: 700 mg via INTRAVENOUS
  Filled 2022-12-13: qty 14

## 2022-12-13 NOTE — Patient Instructions (Signed)
[facility]  Discharge Instructions: Thank you for choosing [facility] to provide your oncology and hematology care.  If you have a lab appointment with the Cancer Center, please go directly to the Cancer Center and check in at the registration area.  Wear comfortable clothing and clothing appropriate for easy access to any Portacath or PICC line.   We strive to give you quality time with your provider. You may need to reschedule your appointment if you arrive late (15 or more minutes).  Arriving late affects you and other patients whose appointments are after yours.  Also, if you miss three or more appointments without notifying the office, you may be dismissed from the clinic at the provider's discretion.      For prescription refill requests, have your pharmacy contact our office and allow 72 hours for refills to be completed.    Today you received the following chemotherapy and/or immunotherapy agents kanjinti, oxaliplatin, leucovorin, and adrucil      To help prevent nausea and vomiting after your treatment, we encourage you to take your nausea medication as directed.  BELOW ARE SYMPTOMS THAT SHOULD BE REPORTED IMMEDIATELY: *FEVER GREATER THAN 100.4 F (38 C) OR HIGHER *CHILLS OR SWEATING *NAUSEA AND VOMITING THAT IS NOT CONTROLLED WITH YOUR NAUSEA MEDICATION *UNUSUAL SHORTNESS OF BREATH *UNUSUAL BRUISING OR BLEEDING *URINARY PROBLEMS (pain or burning when urinating, or frequent urination) *BOWEL PROBLEMS (unusual diarrhea, constipation, pain near the anus) TENDERNESS IN MOUTH AND THROAT WITH OR WITHOUT PRESENCE OF ULCERS (sore throat, sores in mouth, or a toothache) UNUSUAL RASH, SWELLING OR PAIN  UNUSUAL VAGINAL DISCHARGE OR ITCHING   Items with * indicate a potential emergency and should be followed up as soon as possible or go to the Emergency Department if any problems should occur.  Please show the CHEMOTHERAPY ALERT CARD or  IMMUNOTHERAPY ALERT CARD at check-in to the Emergency Department and triage nurse.  Should you have questions after your visit or need to cancel or reschedule your appointment, please contact [facility]  [phone] and follow the prompts.  Office hours are 8:00 a.m. to 4:30 p.m. Monday - Friday. Please note that voicemails left after 4:00 p.m. may not be returned until the following business day.  We are closed weekends and major holidays. You have access to a nurse at all times for urgent questions. Please call the main number to the clinic [phone] and follow the prompts.  For any non-urgent questions, you may also contact your provider using MyChart. We now offer e-Visits for anyone 37 and older to request care online for non-urgent symptoms. For details visit [email or web address].   Also download the MyChart app! Go to the app store, search "MyChart", open the app, select [facility], and log in with your MyChart username and password.

## 2022-12-13 NOTE — Progress Notes (Signed)
Multidisciplinary Oncology Council Documentation  Aaron Short was presented by our [facility] on 12/13/2022, which included representatives from:  Palliative Care Dietitian  Physical/Occupational Therapist Nurse Navigator Genetics Social work Survivorship RN Financial Navigator Research RN   Aaron Short currently presents with history of esophageal cancer  We reviewed previous medical and familial history, history of present illness, and recent lab results along with all available histopathologic and imaging studies. The MOC considered available treatment options and made the following recommendations/referrals:  SLP, SW  The MOC is a meeting of clinicians from various specialty areas who evaluate and discuss patients for whom a multidisciplinary approach is being considered. Final determinations in the plan of care are those of the provider(s).   Today's extended care, comprehensive team conference, Aaron Short was not present for the discussion and was not examined.

## 2022-12-13 NOTE — Assessment & Plan Note (Signed)
Follow up with neurosurgeon.  Recommend calcium and Vitamin D Consider bone strengthen agent.  Dental clearance for zometa.

## 2022-12-13 NOTE — Progress Notes (Signed)
Hematology/Oncology Progress note [phone] [phone]        REFERRING PROVIDER: Rickard Patience, MD    CHIEF COMPLAINTS/PURPOSE OF CONSULTATION:  Esophageal cancer.   ASSESSMENT & PLAN:   Cancer Staging  Esophageal adenocarcinoma [facility]) Staging form: Esophagus - Adenocarcinoma, AJCC 8th Edition - Clinical stage from 11/03/2022: Stage IVB (cTX, cNX, cM1) - Signed by Rickard Patience, MD on 11/03/2022  Malignant neoplasm of urinary bladder [facility]) Staging form: Urinary Bladder, AJCC 7th Edition - Clinical: Stage 0a (Ta, N0, M0) - Signed by Rickard Patience, MD on 11/03/2022   Esophageal adenocarcinoma [facility]) Image findings and pathology results are reviewed with patient and wife.  PET scan results were reviewed and discussed with patient.  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  NGS results come back today positive for HER2 copy again. Labs are reviewed and discussed with patient. Proceed with FOLFOX and Transtuzumab.   Neoplasm related pain Continue follow-up with palliative care service. continue oxycodone 10-20mg  Q 4-6 hours PRN.  MS contin 15mg  BID   Protein calorie malnutrition (HCC) Follow-up with nutritionist Continue nutrition supplements.  Recommend him to hold off Statin.  Add Megace 20mg  Bid.    Chemotherapy induced diarrhea Recommend imodium PRN as directed   Dysuria Chronic dysuria and urinary urgency.  He does not want to take Flomax due to side effect.  UA and urine culture negative.  Recommend patient to further discuss with PCP  Compression fracture of lumbar vertebra (HCC) Follow up with neurosurgeon.  Recommend calcium and Vitamin D Consider bone strengthen agent.  Dental clearance for zometa.    Orders Placed This Encounter  Procedures   CEA    Standing Status:   Future    Standing Expiration Date:   12/27/2023   CBC with Differential (Cancer Center Only)    Standing Status:   Future    Standing  Expiration Date:   12/27/2023   CMP (Cancer Center only)    Standing Status:   Future    Standing Expiration Date:   12/27/2023     Follow-up in 2 weeks for lab MD FOLFOX and trastuzumab. All questions were answered. The patient knows to call the clinic with any problems, questions or concerns.  Rickard Patience, MD, PhD [facility] Hematology Oncology 12/13/2022    HISTORY OF PRESENTING ILLNESS:  Aaron Short 77 y.o. male presents to establish care for esophageal adenocarcinoma.  I have reviewed his chart and materials related to his cancer extensively and collaborated history with the patient. Summary of oncologic history is as follows: Oncology History  Malignant neoplasm of urinary bladder (HCC)  10/23/2013 Initial Diagnosis   Malignant neoplasm of urinary bladder (HCC)   11/03/2022 Cancer Staging   Staging form: Urinary Bladder, AJCC 7th Edition - Clinical: Stage 0a (Ta, N0, M0) - Signed by Rickard Patience, MD on 11/03/2022   Esophageal adenocarcinoma (HCC)  10/20/2022 Imaging   CT abdomen pelvis w contrast  -Multiple bilobar hepatic lesions extending throughout most of the liver parenchyma and concerning for metastatic disease. Correlation with tissue sampling could be considered for definitive pathologic diagnosis.   - Circumferential wall thickening of the distal esophagus and gastroesophageal junction. Recommend further evaluation with endoscopy to exclude underlying malignancy. No other evidence of primary malignancy in the abdomen or pelvis although note is made that evaluation of the pelvic structures is very limited in this CT scan due to extensive artifact secondary to the patient's bilateral total hip arthroplasty.   - Cystic lesion  in the pancreatic tail measuring up to 1.0 cm, possibly a sidebranch IPMN. No obvious pancreatic ductal dilatation. Further evaluation with MRI/MRCP could be considered.   - Extensive degenerative changes to the spine with compression deformities of T11,  T12 and L2, similar to prior.    10/20/2022 Imaging   CT chest w contrast   New 4 mm nodule in the posterior segment of left lower lobe, 3 mm nodule in the left upper chest of pulmonary metastasis.   Stable mild centrilobular emphysema and mild peripheral reticulation which may reflect smoking-related lung fibrosis.   Mildly dilated esophagus with air-fluid level and circumferential thickening in the distal esophagus which may reflect chronic esophagitis. Consider endoscopic evaluation.    11/03/2022 Initial Diagnosis   Esophageal adenocarcinoma   + dysphagia for both liquid and solid food, cough after eating. Unintentional weight loss 50 pounds over the past years, 14 pounds within last year.   10/30/22 EGD showed A large, submucosal mass with no bleeding and stigmata of recent bleeding was found in the lower third of the esophagus, 35 to 40 cm from the incisors. The mass was partially obstructing and circumferential. Biopsies were taken with a cold forceps for histology.  Pathology showed moderately differentiated adenocarcinoma involving squamocolumnar junctional mucosa with focal intestinal metaplasia.   [facility] NGS showed ERBB2 copy number gain, TP53 stop gain, NSD1 frameshift, RARA copy number gain, TOP2A copy number gain. TMB 7.9 m/mb, MSI stable.    11/03/2022 Cancer Staging   Staging form: Esophagus - Adenocarcinoma, AJCC 8th Edition - Clinical stage from 11/03/2022: Stage IVB (cTX, cNX, cM1) - Signed by Rickard Patience, MD on 11/03/2022 Stage prefix: Initial diagnosis   11/13/2022 Imaging   PET scan showed 1. Tracer avid tumor is identified within the distal esophagus extending into the cardia. 2. Extensive tracer avid liver metastases. 3. Tiny nodule within the medial left lower lobe is too small to characterize by PET-CT measuring 4 mm. Attention to this nodule on future surveillance imaging is advised. 4. Asymmetric uptake localizing to the right first rib costosternal junction and left AC  joint is noted. Findings are favored to represent arthropathic change 5. Gallstones. 6.  Aortic Atherosclerosis   11/13/2022 Procedure   Mediport placement by IR   11/15/2022 - 11/17/2022 Chemotherapy   Patient is on Treatment Plan : GASTROESOPHAGEAL FOLFOX q14d x 6 cycles     11/29/2022 -  Chemotherapy   Patient is on Treatment Plan : GASTROESOPHAGEAL Trastuzumab (6/4) D1 + FOLFOX D1 q14d x 12 cycles / Trastuzumab q14d      He is a current every day smoker. He denies abdominal pain, nausea vomiting. Gait is not steady, frequent falls, this is a chronic issue.  Aifb on Xarelto. He currently is able to eat soft moist food.  + chronic neck and back pain, joint pain, he takes oxycodone 10-20mg  every 4-6 hours. Pain is controlled better.   During the interval he had a fall and was admitted. Closed compression fracture of lumbar vertebral body, traumatic rhabdomyolysis.  Discharged on 12/05/2022  Appetite is slightly better,weight is stable.  + Dysuria for about 6 weeks. Chronically increased urinary urgency. UA urine culture negative.    MEDICAL HISTORY:  Past Medical History:  Diagnosis Date   Arthralgia of lower leg 04/02/2012   Arthralgia of upper arm 06/18/2009   Cancer (HCC)    Diabetes mellitus without complication (HCC)    FH: atrial fibrillation    Hypertension     SURGICAL HISTORY: Past Surgical  History:  Procedure Laterality Date   bladder cancer surgery  2015   ESOPHAGOGASTRODUODENOSCOPY (EGD) WITH PROPOFOL N/A 10/30/2022   Procedure: ESOPHAGOGASTRODUODENOSCOPY (EGD) WITH PROPOFOL;  Surgeon: Toney Reil, MD;  Location: [facility] ENDOSCOPY;  Service: Gastroenterology;  Laterality: N/A;   IR IMAGING GUIDED PORT INSERTION  11/13/2022   JOINT REPLACEMENT     REPLACEMENT TOTAL HIP W/  RESURFACING IMPLANTS Bilateral    SHOULDER SURGERY Bilateral    TOTAL KNEE ARTHROPLASTY Bilateral     SOCIAL HISTORY: Social History   Socioeconomic History   Marital status: Married     Spouse name: Not on file   Number of children: Not on file   Years of education: Not on file   Highest education level: Not on file  Occupational History   Not on file  Tobacco Use   Smoking status: Every Day    Current packs/day: 0.50    Types: Cigarettes   Smokeless tobacco: Never  Vaping Use   Vaping status: Never Used  Substance and Sexual Activity   Alcohol use: No    Alcohol/week: 0.0 standard drinks of alcohol   Drug use: No   Sexual activity: Not on file  Other Topics Concern   Not on file  Social History Narrative   Lives with wife, [name].    Social Determinants of Health   Financial Resource Strain: Low Risk  (11/03/2022)   Overall Financial Resource Strain (CARDIA)    Difficulty of Paying Living Expenses: Not very hard  Food Insecurity: No Food Insecurity (12/04/2022)   Hunger Vital Sign    Worried About Running Out of Food in the Last Year: Never true    Ran Out of Food in the Last Year: Never true  Transportation Needs: No Transportation Needs (12/04/2022)   PRAPARE - Administrator, Civil Service (Medical): No    Lack of Transportation (Non-Medical): No  Physical Activity: Sufficiently Active (02/16/2021)   [facility], [facility]   Exercise Vital Sign    Days of Exercise per Week: 7 days    Minutes of Exercise per Session: 150+ min  Stress: No Stress Concern Present (11/03/2022)   [employer] of Occupational Health - Occupational Stress Questionnaire    Feeling of Stress : Only a little  Social Connections: Moderately Integrated (02/16/2021)   Received from [facility], [facility]   Social Connection and Isolation Panel [NHANES]    Frequency of Communication with Friends and Family: Three times a week    Frequency of Social Gatherings with Friends and Family: Three times a week    Attends Religious Services: Never    Active Member of Clubs or Organizations: Yes    Attends Banker Meetings: More than 4  times per year    Marital Status: Married  Catering manager Violence: Not At Risk (12/04/2022)   Humiliation, Afraid, Rape, and Kick questionnaire    Fear of Current or Ex-Partner: No    Emotionally Abused: No    Physically Abused: No    Sexually Abused: No    FAMILY HISTORY: Family History  Problem Relation Age of Onset   Dementia Mother    Heart disease Father     ALLERGIES:  is allergic to diltiazem, penicillins, and tizanidine.  MEDICATIONS:  Current Outpatient Medications  Medication Sig Dispense Refill   B Complex-C (VITAMIN B + C COMPLEX) TABS Take 1 capsule by mouth every morning.     carisoprodol (SOMA) 350 MG  tablet Take 350 mg by mouth 2 (two) times daily.      Cetirizine HCl 10 MG CAPS Take 1 capsule by mouth every morning.     dexamethasone (DECADRON) 4 MG tablet Take 2 tablets (8 mg total) by mouth daily. Start the day after chemotherapy for 2 days. Take with food. 30 tablet 1   lidocaine-prilocaine (EMLA) cream Apply to affected area once 30 g 3   loperamide (IMODIUM) 2 MG capsule Take 1 capsule (2 mg total) by mouth See admin instructions. Initial: 4 mg,the 2 mg every 2 hours (4 mg every 4 hours at night)  maximum: 16 mg/day 60 capsule 2   Magnesium 500 MG TABS Take 500 mg by mouth as needed.     megestrol (MEGACE) 20 MG tablet Take 1 tablet (20 mg total) by mouth 2 (two) times daily. 60 tablet 0   melatonin (MELATONIN MAXIMUM STRENGTH) 5 MG TABS Take 5 mg by mouth at bedtime as needed.     morphine (MS CONTIN) 15 MG 12 hr tablet Take 1 tablet (15 mg total) by mouth every 12 (twelve) hours. 60 tablet 0   naloxone (NARCAN) nasal spray 4 mg/0.1 mL Place 1 spray into the nose once for 1 dose. Spray half of bottle content into each nostril, then call 911 2 each 0   omeprazole (PRILOSEC) 10 MG capsule Take 20 mg by mouth daily.     ondansetron (ZOFRAN) 8 MG tablet Take 1 tablet (8 mg total) by mouth every 8 (eight) hours as needed for nausea or vomiting. Start on the third  day after chemotherapy. 30 tablet 1   Oxycodone HCl 10 MG TABS Take 1-2 tablets (10-20 mg total) by mouth every 4 (four) hours as needed (pain). 120 tablet 0   prochlorperazine (COMPAZINE) 10 MG tablet Take 1 tablet (10 mg total) by mouth every 6 (six) hours as needed for nausea or vomiting. 30 tablet 1   rivaroxaban (XARELTO) 20 MG TABS tablet Take 20 mg by mouth daily.     sertraline (ZOLOFT) 50 MG tablet Take 50 mg by mouth at bedtime.     prazosin (MINIPRESS) 1 MG capsule Take 1 mg by mouth at bedtime. (Patient not taking: Reported on 12/13/2022)     No current facility-administered medications for this visit.    Review of Systems  Constitutional:  Positive for appetite change, fatigue and unexpected weight change. Negative for chills and fever.  HENT:   Negative for hearing loss and voice change.   Eyes:  Negative for eye problems and icterus.  Respiratory:  Negative for chest tightness, cough and shortness of breath.   Cardiovascular:  Negative for chest pain and leg swelling.  Gastrointestinal:  Negative for abdominal distention and abdominal pain.       Heart burn  Endocrine: Negative for hot flashes.  Genitourinary:  Negative for difficulty urinating, dysuria and frequency.   Musculoskeletal:  Positive for arthralgias, back pain, gait problem and neck pain.  Skin:  Negative for itching and rash.  Neurological:  Positive for gait problem. Negative for light-headedness and numbness.       Falls.   Hematological:  Negative for adenopathy. Does not bruise/bleed easily.  Psychiatric/Behavioral:  Negative for confusion.      PHYSICAL EXAMINATION: ECOG PERFORMANCE STATUS: 1 - Symptomatic but completely ambulatory  Vitals:   12/13/22 0841  BP: (!) 116/57  Pulse: 64  Resp: 18  Temp: 97.9 F (36.6 C)  SpO2: 98%   Filed Weights  12/13/22 0841  Weight: 135 lb 8 oz (61.5 kg)    Physical Exam Constitutional:      General: He is not in acute distress.    Appearance: He is  ill-appearing. He is not diaphoretic.  HENT:     Head: Normocephalic and atraumatic.  Eyes:     General: No scleral icterus.    Pupils: Pupils are equal, round, and reactive to light.  Cardiovascular:     Rate and Rhythm: Normal rate and regular rhythm.     Heart sounds: No murmur heard. Pulmonary:     Effort: Pulmonary effort is normal. No respiratory distress.     Breath sounds: Normal breath sounds. No wheezing.  Abdominal:     General: There is no distension.     Palpations: Abdomen is soft.     Tenderness: There is no abdominal tenderness.  Musculoskeletal:        General: Normal range of motion.     Cervical back: Normal range of motion and neck supple.  Skin:    General: Skin is warm and dry.     Findings: No erythema.  Neurological:     Mental Status: He is alert and oriented to person, place, and time. Mental status is at baseline.     Cranial Nerves: No cranial nerve deficit.     Motor: No abnormal muscle tone.  Psychiatric:        Mood and Affect: Mood and affect normal.      LABORATORY DATA:  I have reviewed the data as listed    Latest Ref Rng & Units 12/13/2022    8:00 AM 12/03/2022   11:44 PM 11/29/2022    8:02 AM  CBC  WBC 4.0 - 10.5 K/uL 9.0  3.3  7.9   Hemoglobin 13.0 - 17.0 g/dL 40.9  81.1  91.4   Hematocrit 39.0 - 52.0 % 31.0  33.0  34.0   Platelets 150 - 400 K/uL 231  283  200       Latest Ref Rng & Units 12/13/2022    8:00 AM 12/05/2022    5:30 AM 12/04/2022   10:15 AM  CMP  Glucose 70 - 99 mg/dL 782  85  93   BUN 8 - 23 mg/dL 17  16  21    Creatinine 0.61 - 1.24 mg/dL 9.56  2.13  0.86   Sodium 135 - 145 mmol/L 133  133  133   Potassium 3.5 - 5.1 mmol/L 3.9  4.2  4.0   Chloride 98 - 111 mmol/L 101  100  98   CO2 22 - 32 mmol/L 26  25  27    Calcium 8.9 - 10.3 mg/dL 8.6  8.0  8.5   Total Protein 6.5 - 8.1 g/dL 6.4     Total Bilirubin 0.3 - 1.2 mg/dL 0.5     Alkaline Phos 38 - 126 U/L 121     AST 15 - 41 U/L 48     ALT 0 - 44 U/L 33         RADIOGRAPHIC STUDIES: I have personally reviewed the radiological images as listed and agreed with the findings in the report. CT T-SPINE NO CHARGE  Result Date: 12/04/2022 CLINICAL DATA:  Fall. EXAM: CT THORACIC SPINE WITHOUT CONTRAST TECHNIQUE: Multidetector CT images of the thoracic were obtained using the standard protocol without intravenous contrast. RADIATION DOSE REDUCTION: This exam was performed according to the departmental dose-optimization program which includes automated exposure control, adjustment of the mA and/or  kV according to patient size and/or use of iterative reconstruction technique. COMPARISON:  Chest CT today. PET CT 11/13/2022. Outside CT 10/20/2022. FINDINGS: Alignment: Normal Vertebrae: Chronic compression fractures involving the T12 and L2 vertebral body are stable since prior CT. Mild compression fracture through the superior endplate of L1 and superior endplate of T11 are new since prior study. Paraspinal and other soft tissues: Negative. Disc levels: Diffuse degenerative disc disease. Anterior and lateral spurring. IMPRESSION: Mild compression fractures through the superior endplates of T11 and L1, new since prior CT. Stable chronic mild compression fractures at T12 and L2. Electronically Signed   By: Charlett Nose M.D.   On: 12/04/2022 01:20   CT CHEST WO CONTRAST  Result Date: 12/04/2022 CLINICAL DATA:  fall, eval rib fx, ptx.  Esophageal cancer. EXAM: CT CHEST WITHOUT CONTRAST TECHNIQUE: Multidetector CT imaging of the chest was performed following the standard protocol without IV contrast. RADIATION DOSE REDUCTION: This exam was performed according to the departmental dose-optimization program which includes automated exposure control, adjustment of the mA and/or kV according to patient size and/or use of iterative reconstruction technique. COMPARISON:  Outside study 10/20/2022.  PET CT 11/13/2022. FINDINGS: Cardiovascular: Coronary artery and aortic calcifications.  Heart is normal size. Aorta is normal caliber. Mediastinum/Nodes: No mediastinal, hilar, or axillary adenopathy. Trachea and thyroid unremarkable. Mild distal esophageal wall thickening. Lungs/Pleura: Changes of fibrosis peripherally in the lungs. No acute confluent airspace opacities or effusions. No pneumothorax. Upper Abdomen: Numerous hepatic metastases as seen on prior study. Musculoskeletal: No visible rib fracture. Chronic compression fractures noted at T12 and L2. Mild compression fracture through the superior endplate of L1 is new since prior CT. Chest wall soft tissues unremarkable. IMPRESSION: Mild new compression fracture through the superior endplate of L1. Stable chronic compression fractures at T12 and L2. Coronary artery disease, aortic atherosclerosis. Mild wall thickening in the distal esophagus in the area of non esophageal cancer as seen on prior PET CT. Extensive a patent metastases. Fibrosis peripherally in the lungs.  No pneumothorax or effusion. Aortic Atherosclerosis (ICD10-I70.0). Electronically Signed   By: Charlett Nose M.D.   On: 12/04/2022 00:40   CT Lumbar Spine Wo Contrast  Result Date: 12/04/2022 CLINICAL DATA:  Fall, tailbone pain. EXAM: CT LUMBAR SPINE WITHOUT CONTRAST TECHNIQUE: Multidetector CT imaging of the lumbar spine was performed without intravenous contrast administration. Multiplanar CT image reconstructions were also generated. RADIATION DOSE REDUCTION: This exam was performed according to the departmental dose-optimization program which includes automated exposure control, adjustment of the mA and/or kV according to patient size and/or use of iterative reconstruction technique. COMPARISON:  PET CT 11/13/2022. outside CT 10/20/2022 FINDINGS: Segmentation: 5 lumbar type vertebrae. Alignment: Normal. Vertebrae: Moderate compression fractures involving T12 and L2. Mild compression fracture through the superior endplate of L1. Paraspinal and other soft tissues: Negative  Disc levels: Diffuse degenerative facet disease. Anterior spurring throughout the lower thoracic and lumbar spine. Vacuum disc noted. IMPRESSION: Mild to moderate compression fractures involving T12, L1 and L2 vertebral bodies. The T12 and L2 vertebral bodies are chronic, seen on outside CT from 10/20/2022. The L1 compression fracture is new since that study. Electronically Signed   By: Charlett Nose M.D.   On: 12/04/2022 00:35   CT Cervical Spine Wo Contrast  Result Date: 12/04/2022 CLINICAL DATA:  Fall EXAM: CT CERVICAL SPINE WITHOUT CONTRAST TECHNIQUE: Multidetector CT imaging of the cervical spine was performed without intravenous contrast. Multiplanar CT image reconstructions were also generated. RADIATION DOSE REDUCTION: This exam was performed  according to the departmental dose-optimization program which includes automated exposure control, adjustment of the mA and/or kV according to patient size and/or use of iterative reconstruction technique. COMPARISON:  None Available. FINDINGS: Alignment: No subluxation Skull base and vertebrae: No acute fracture. No primary bone lesion or focal pathologic process. Soft tissues and spinal canal: No prevertebral fluid or swelling. No visible canal hematoma. Disc levels: Diffuse degenerative disc disease with disc space narrowing and anterior spurring. Bilateral degenerative facet disease. Upper chest: No acute findings.  Biapical scarring. Other: None IMPRESSION: Cervical spondylosis.  No acute bony abnormality. Electronically Signed   By: Charlett Nose M.D.   On: 12/04/2022 00:28   CT HEAD WO CONTRAST ( )  Result Date: 12/04/2022 CLINICAL DATA:  Fall EXAM: CT HEAD WITHOUT CONTRAST TECHNIQUE: Contiguous axial images were obtained from the base of the skull through the vertex without intravenous contrast. RADIATION DOSE REDUCTION: This exam was performed according to the departmental dose-optimization program which includes automated exposure control, adjustment of  the mA and/or kV according to patient size and/or use of iterative reconstruction technique. COMPARISON:  None Available. FINDINGS: Brain: There is atrophy and chronic small vessel disease changes. No acute intracranial abnormality. Specifically, no hemorrhage, hydrocephalus, mass lesion, acute infarction, or significant intracranial injury. Vascular: No hyperdense vessel or unexpected calcification. Skull: No acute calvarial abnormality. Sinuses/Orbits: No acute findings Other: None IMPRESSION: Atrophy, chronic microvascular disease. No acute intracranial abnormality. Electronically Signed   By: Charlett Nose M.D.   On: 12/04/2022 00:26   NM Cardiac Muga Rest  Result Date: 11/16/2022 CLINICAL DATA:  baseline heart function for chemo eval EXAM: NUCLEAR MEDICINE CARDIAC BLOOD POOL IMAGING (MUGA) TECHNIQUE: Cardiac multi-gated acquisition was performed at rest following intravenous injection of Tc-39m labeled red blood cells. RADIOPHARMACEUTICALS:  21.1 mCi Tc-37m pertechnetate in-vitro labeled red blood cells IV COMPARISON:  PET-CT, 11/13/2022 CTA chest 10/20/2022 FINDINGS: LEFT ventricular wall normal in size and demonstrate normal motion. Ventricular cavities normal. Calculated LEFT ejection fraction is calculated at 58%. No prior comparison MPI or MUGA. IMPRESSION: 1. Normal LEFT ventricular wall motion. 2. Calculated LVEF 58% Electronically Signed   By: Roanna Banning M.D.   On: 11/16/2022 16:02   IR IMAGING GUIDED PORT INSERTION  Result Date: 11/13/2022 INDICATION: Esophageal adenocarcinoma. Patient presents for port catheter placement. EXAM: IMPLANTED PORT A CATH PLACEMENT WITH ULTRASOUND AND FLUOROSCOPIC GUIDANCE MEDICATIONS: None. ANESTHESIA/SEDATION: Versed 1 mg IV; Fentanyl 75 mcg IV; Moderate Sedation Time:  14 minutes The patient's vital signs and level of consciousness were continuously monitored during the procedure by the interventional radiology nurse under my direct supervision. FLUOROSCOPY:  Radiation exposure index: 1 mGy reference air kerma COMPLICATIONS: None immediate. PROCEDURE: The right neck and chest was prepped with chlorhexidine, and draped in the usual sterile fashion using maximum barrier technique (cap and mask, sterile gown, sterile gloves, large sterile sheet, hand hygiene and cutaneous antiseptic). Local anesthesia was attained by infiltration with 1% lidocaine with epinephrine. Ultrasound demonstrated patency of the right internal jugular vein, and this was documented with an image. Under real-time ultrasound guidance, this vein was accessed with a 21 gauge micropuncture needle and image documentation was performed. A small dermatotomy was made at the access site with an 11 scalpel. A 0.018" wire was advanced into the SVC and the access needle exchanged for a 70F micropuncture vascular sheath. The 0.018" wire was then removed and a 0.035" wire advanced into the IVC. An appropriate location for the subcutaneous reservoir was selected below the clavicle and an  incision was made through the skin and underlying soft tissues. The subcutaneous tissues were then dissected using a combination of blunt and sharp surgical technique and a pocket was formed. A Bard ISP single lumen power injectable portacatheter was then tunneled through the subcutaneous tissues from the pocket to the dermatotomy and the port reservoir placed within the subcutaneous pocket. The venous access site was then serially dilated and a peel away vascular sheath placed over the wire. The wire was removed and the port catheter advanced into position under fluoroscopic guidance. The catheter tip is positioned in the superior cavoatrial junction. This was documented with a spot image. The portacatheter was then tested and found to flush and aspirate well. The port was flushed with saline followed by 100 units/mL heparinized saline. The pocket was then closed in two layers using first subdermal inverted interrupted absorbable  sutures followed by a running subcuticular suture. The epidermis was then sealed with Dermabond. The dermatotomy at the venous access site was also closed with Dermabond. IMPRESSION: Successful placement of a right IJ approach Power Port with ultrasound and fluoroscopic guidance. The catheter is ready for use. Electronically Signed   By: Malachy Moan M.D.   On: 11/13/2022 15:27   NM PET Image Initial (PI) Skull Base To Thigh  Result Date: 11/13/2022 CLINICAL DATA:  Initial treatment strategy for esophageal carcinoma. EXAM: NUCLEAR MEDICINE PET SKULL BASE TO THIGH TECHNIQUE: 7.65 mCi F-18 FDG was injected intravenously. Full-ring PET imaging was performed from the skull base to thigh after the radiotracer. CT data was obtained and used for attenuation correction and anatomic localization. Fasting blood glucose: 93 mg/dl COMPARISON:  CT 16/02/9603 FINDINGS: Mediastinal blood pool activity: SUV max 1.39 Liver activity: SUV max NA NECK: No hypermetabolic lymph nodes in the neck. Incidental CT findings: None. CHEST: Tracer avid tumor within the distal esophagus has an SUV max 9.97 with corresponding mild circumferential wall thickening on the CT images. No tracer avid mediastinal or hilar lymph nodes. Tiny nodule within the medial left lower lobe is too small to characterize by PET-CT measuring 4 mm with SUV max 1.26, image 59/4. Incidental CT findings: Signs of chronic postinflammatory lung disease identified with bilateral peripheral interstitial reticulation. Aortic atherosclerosis with coronary artery calcifications. ABDOMEN/PELVIS: Extensive tracer avid liver metastases are identified throughout both lobes of the liver. Lesions are too numerous to count. Reference lesions include: -within segment 7/8 right hepatic lobe lesion measures 8.8 x 7.1 cm within SUV max of 30.72, image 72/4. -within segment 3 lesion measures 3.6 x 3.0 cm with SUV max of 25.08, image 88/4. -within segment 6 lesion measures 5.4 x 4.2  cm with SUV max 30.95, image 100/4. Tracer avid tumor extending from the distal esophagus into the cardia has an SUV max 14.45, image 75/4. No tracer avid abdominopelvic lymph nodes. Incidental CT findings: Aortic atherosclerosis. Small gallstones identified. SKELETON: No focal hypermetabolic activity to suggest skeletal metastasis. Incidental CT findings: There is asymmetric increased uptake localizing to the right first rib costosternal junction with SUV max 5.78, image 48/4. Asymmetric uptake localizing to the left [facility] joint is also noted. Status post bilateral hip arthroplasty. IMPRESSION: 1. Tracer avid tumor is identified within the distal esophagus extending into the cardia. 2. Extensive tracer avid liver metastases. 3. Tiny nodule within the medial left lower lobe is too small to characterize by PET-CT measuring 4 mm. Attention to this nodule on future surveillance imaging is advised. 4. Asymmetric uptake localizing to the right first rib costosternal junction and  left AC joint is noted. Findings are favored to represent arthropathic change 5. Gallstones. 6.  Aortic Atherosclerosis (ICD10-I70.0). Electronically Signed   By: Signa Kell M.D.   On: 11/13/2022 10:20

## 2022-12-13 NOTE — Assessment & Plan Note (Signed)
Chronic dysuria and urinary urgency.  He does not want to take Flomax due to side effect.  UA and urine culture negative.  Recommend patient to further discuss with PCP

## 2022-12-13 NOTE — Assessment & Plan Note (Addendum)
Image findings and pathology results are reviewed with patient and wife.  PET scan results were reviewed and discussed with patient.  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  NGS results come back today positive for HER2 copy again. Labs are reviewed and discussed with patient. Proceed with FOLFOX and Transtuzumab.

## 2022-12-13 NOTE — Assessment & Plan Note (Signed)
Continue follow-up with palliative care service. continue oxycodone 10-20mg  Q 4-6 hours PRN.  MS contin 15mg  BID

## 2022-12-13 NOTE — Assessment & Plan Note (Signed)
Follow-up with nutritionist Continue nutrition supplements.  Recommend him to hold off Statin.  Add Megace 20mg  Bid.

## 2022-12-13 NOTE — Assessment & Plan Note (Signed)
Recommend imodium PRN as directed  

## 2022-12-13 NOTE — Progress Notes (Signed)
Nutrition Assessment   Reason for Assessment:   Esophageal cancer   ASSESSMENT:  77 year old male with esophageal adenocarcinoma, stage IV with liver involvement.  Past medical history of DM, HTN, afib, bladder cancer.  Patient receiving folfox and transtuzumab.  Met with patient in infusion.  Reports that megace has helped appetite a little bit.  Has trouble with nausea. Says that he is taking nausea medication but unsure what medication it is.  Noted hospital admission recently with compression fracture.  Says that he is drinking Boost VHC 2-3 times per day. Eating some fruit.  Can't really tell RD anything else he is eating.    Medications: imodium, megace, zofran, compazine, decadron, prilosec   Labs: Na 133, glucose 127, calcium 8.6   Anthropometrics:   Weight 135 lb 8 oz today  154 lb on 01/19/22  12% weight loss in the last 11 months, concerning   Estimated Energy Needs  Kcals: 1525-1800 Protein: 76-90 g Fluid: 1525-1800 ml   NUTRITION DIAGNOSIS: Inadequate oral intake related to esophageal cancer and related treatment side effects as evidenced by 12% weight loss in the last 1 11 months, nausea, decreased intake    INTERVENTION:  Recommend boost VHC at least 4 times a day Encouraged taking nausea medication Contact information provided   MONITORING, EVALUATION, GOAL: weight trends, intake   Next Visit: Wednesday, July 31 during infusion   Seena Ritacco B. Freida Busman, RD, LDN Registered Dietitian [phone]

## 2022-12-14 LAB — CEA: CEA: 290 ng/mL — ABNORMAL HIGH (ref 0.0–4.7)

## 2022-12-15 ENCOUNTER — Telehealth: Payer: Self-pay

## 2022-12-15 ENCOUNTER — Inpatient Hospital Stay: Payer: Medicare Other

## 2022-12-15 DIAGNOSIS — C159 Malignant neoplasm of esophagus, unspecified: Secondary | ICD-10-CM

## 2022-12-15 DIAGNOSIS — Z5112 Encounter for antineoplastic immunotherapy: Secondary | ICD-10-CM | POA: Diagnosis not present

## 2022-12-15 MED ORDER — SODIUM CHLORIDE 0.9% FLUSH
10.0000 mL | INTRAVENOUS | Status: DC | PRN
  Administered 2022-12-15: 10 mL
  Filled 2022-12-15: qty 10

## 2022-12-15 MED ORDER — HEPARIN SOD (PORK) LOCK FLUSH 100 UNIT/ML IV SOLN
500.0000 [IU] | Freq: Once | INTRAVENOUS | Status: AC | PRN
  Administered 2022-12-15: 500 [IU]
  Filled 2022-12-15: qty 5

## 2022-12-15 NOTE — Telephone Encounter (Signed)
CSW attempted to contact patient per [facility]. Left vm.

## 2022-12-19 ENCOUNTER — Other Ambulatory Visit: Payer: Self-pay | Admitting: *Deleted

## 2022-12-19 MED ORDER — OXYCODONE HCL 10 MG PO TABS: 10.0000 mg | ORAL_TABLET | ORAL | 0 refills | Status: DC | PRN

## 2022-12-22 ENCOUNTER — Ambulatory Visit: Payer: Medicare Other

## 2022-12-22 ENCOUNTER — Other Ambulatory Visit: Payer: Medicare Other

## 2022-12-22 ENCOUNTER — Ambulatory Visit: Payer: Medicare Other | Admitting: Oncology

## 2022-12-25 ENCOUNTER — Inpatient Hospital Stay
Admission: RE | Admit: 2022-12-25 | Discharge: 2022-12-25 | Disposition: A | Discharge status: Home / Self Care | Payer: Self-pay | Source: Ambulatory Visit | Attending: Neurosurgery | Admitting: Neurosurgery

## 2022-12-25 ENCOUNTER — Other Ambulatory Visit: Payer: Self-pay

## 2022-12-25 DIAGNOSIS — Z049 Encounter for examination and observation for unspecified reason: Secondary | ICD-10-CM

## 2022-12-27 ENCOUNTER — Inpatient Hospital Stay: Payer: Medicare Other | Admitting: Oncology

## 2022-12-27 ENCOUNTER — Ambulatory Visit: Payer: Medicare Other

## 2022-12-27 ENCOUNTER — Inpatient Hospital Stay: Payer: Medicare Other

## 2022-12-27 ENCOUNTER — Encounter: Payer: Self-pay | Admitting: Oncology

## 2022-12-27 ENCOUNTER — Other Ambulatory Visit: Payer: Medicare Other

## 2022-12-27 ENCOUNTER — Ambulatory Visit: Payer: Medicare Other | Admitting: Oncology

## 2022-12-27 VITALS — BP 107/50 | HR 65 | Temp 98.4°F | Resp 18 | Wt 132.0 lb

## 2022-12-27 DIAGNOSIS — S32000D Wedge compression fracture of unspecified lumbar vertebra, subsequent encounter for fracture with routine healing: Secondary | ICD-10-CM

## 2022-12-27 DIAGNOSIS — C159 Malignant neoplasm of esophagus, unspecified: Secondary | ICD-10-CM

## 2022-12-27 DIAGNOSIS — F17210 Nicotine dependence, cigarettes, uncomplicated: Secondary | ICD-10-CM

## 2022-12-27 DIAGNOSIS — R634 Abnormal weight loss: Secondary | ICD-10-CM

## 2022-12-27 DIAGNOSIS — G893 Neoplasm related pain (acute) (chronic): Secondary | ICD-10-CM

## 2022-12-27 DIAGNOSIS — T451X5D Adverse effect of antineoplastic and immunosuppressive drugs, subsequent encounter: Secondary | ICD-10-CM

## 2022-12-27 DIAGNOSIS — K521 Toxic gastroenteritis and colitis: Secondary | ICD-10-CM | POA: Diagnosis not present

## 2022-12-27 DIAGNOSIS — E46 Unspecified protein-calorie malnutrition: Secondary | ICD-10-CM | POA: Diagnosis not present

## 2022-12-27 DIAGNOSIS — Z5112 Encounter for antineoplastic immunotherapy: Secondary | ICD-10-CM | POA: Diagnosis not present

## 2022-12-27 LAB — CMP (CANCER CENTER ONLY)
ALT: 18 U/L (ref 0–44)
AST: 35 U/L (ref 15–41)
Albumin: 3 g/dL — ABNORMAL LOW (ref 3.5–5.0)
Alkaline Phosphatase: 124 U/L (ref 38–126)
Anion gap: 7 (ref 5–15)
BUN: 16 mg/dL (ref 8–23)
CO2: 28 mmol/L (ref 22–32)
Calcium: 8.7 mg/dL — ABNORMAL LOW (ref 8.9–10.3)
Chloride: 99 mmol/L (ref 98–111)
Creatinine: 0.68 mg/dL (ref 0.61–1.24)
GFR, Estimated: >60 mL/min (ref >60)
Glucose, Bld: 143 mg/dL — ABNORMAL HIGH (ref 70–99)
Potassium: 3.7 mmol/L (ref 3.5–5.1)
Sodium: 134 mmol/L — ABNORMAL LOW (ref 135–145)
Total Bilirubin: 0.5 mg/dL (ref 0.3–1.2)
Total Protein: 6.4 g/dL — ABNORMAL LOW (ref 6.5–8.1)

## 2022-12-27 LAB — CBC WITH DIFFERENTIAL (CANCER CENTER ONLY)
Abs Immature Granulocytes: 0.01 10*3/uL (ref 0.00–0.07)
Basophils Absolute: 0.1 10*3/uL (ref 0.0–0.1)
Basophils Relative: 1 %
Eosinophils Absolute: 0.1 10*3/uL (ref 0.0–0.5)
Eosinophils Relative: 2 %
HCT: 30.8 % — ABNORMAL LOW (ref 39.0–52.0)
Hemoglobin: 10.5 g/dL — ABNORMAL LOW (ref 13.0–17.0)
Immature Granulocytes: 0 %
Lymphocytes Relative: 33 %
Lymphs Abs: 2.3 10*3/uL (ref 0.7–4.0)
MCH: 32.8 pg (ref 26.0–34.0)
MCHC: 34.1 g/dL (ref 30.0–36.0)
MCV: 96.3 fL (ref 80.0–100.0)
Monocytes Absolute: 1.1 10*3/uL — ABNORMAL HIGH (ref 0.1–1.0)
Monocytes Relative: 16 %
Neutro Abs: 3.3 10*3/uL (ref 1.7–7.7)
Neutrophils Relative %: 48 %
Platelet Count: 240 10*3/uL (ref 150–400)
RBC: 3.2 MIL/uL — ABNORMAL LOW (ref 4.22–5.81)
RDW: 16.6 % — ABNORMAL HIGH (ref 11.5–15.5)
WBC Count: 7 10*3/uL (ref 4.0–10.5)
nRBC: 0 % (ref 0.0–0.2)

## 2022-12-27 MED ORDER — PROCHLORPERAZINE MALEATE 10 MG PO TABS
10.0000 mg | ORAL_TABLET | Freq: Four times a day (QID) | ORAL | 1 refills | Status: DC | PRN
Start: 2022-12-27 — End: 2023-03-21

## 2022-12-27 MED ORDER — SODIUM CHLORIDE 0.9% FLUSH
10.0000 mL | INTRAVENOUS | Status: DC | PRN
  Administered 2022-12-27: 10 mL via INTRAVENOUS
  Filled 2022-12-27: qty 10

## 2022-12-27 MED ORDER — SODIUM CHLORIDE 0.9% FLUSH
10.0000 mL | INTRAVENOUS | Status: DC | PRN
  Filled 2022-12-27: qty 10

## 2022-12-27 MED ORDER — LEUCOVORIN CALCIUM INJECTION 350 MG
400.0000 mg/m2 | Freq: Once | INTRAVENOUS | Status: AC
  Administered 2022-12-27: 692 mg via INTRAVENOUS
  Filled 2022-12-27: qty 34.6

## 2022-12-27 MED ORDER — PALONOSETRON HCL INJECTION 0.25 MG/5ML
0.2500 mg | Freq: Once | INTRAVENOUS | Status: AC
  Administered 2022-12-27: 0.25 mg via INTRAVENOUS
  Filled 2022-12-27: qty 5

## 2022-12-27 MED ORDER — ACETAMINOPHEN 325 MG PO TABS
650.0000 mg | ORAL_TABLET | Freq: Once | ORAL | Status: AC
  Administered 2022-12-27: 650 mg via ORAL
  Filled 2022-12-27: qty 2

## 2022-12-27 MED ORDER — SODIUM CHLORIDE 0.9 % IV SOLN
2400.0000 mg/m2 | INTRAVENOUS | Status: DC
  Administered 2022-12-27: 4150 mg via INTRAVENOUS
  Filled 2022-12-27: qty 83

## 2022-12-27 MED ORDER — MEGESTROL ACETATE 20 MG PO TABS: 20.0000 mg | ORAL_TABLET | Freq: Two times a day (BID) | ORAL | 1 refills | Status: DC

## 2022-12-27 MED ORDER — TRASTUZUMAB-ANNS CHEMO 150 MG IV SOLR
4.0000 mg/kg | Freq: Once | INTRAVENOUS | Status: AC
  Administered 2022-12-27: 252 mg via INTRAVENOUS
  Filled 2022-12-27: qty 12

## 2022-12-27 MED ORDER — SODIUM CHLORIDE 0.9 % IV SOLN
Freq: Once | INTRAVENOUS | Status: AC
  Filled 2022-12-27: qty 250

## 2022-12-27 MED ORDER — ONDANSETRON HCL 8 MG PO TABS
8.0000 mg | ORAL_TABLET | Freq: Three times a day (TID) | ORAL | 1 refills | Status: AC | PRN
Start: 2022-12-27 — End: ?

## 2022-12-27 MED ORDER — LEUCOVORIN CALCIUM INJECTION 350 MG
400.0000 mg/m2 | Freq: Once | INTRAVENOUS | Status: DC
  Filled 2022-12-27: qty 34.6

## 2022-12-27 MED ORDER — DEXTROSE 5 % IV SOLN
Freq: Once | INTRAVENOUS | Status: AC
  Filled 2022-12-27: qty 250

## 2022-12-27 MED ORDER — SODIUM CHLORIDE 0.9 % IV SOLN
10.0000 mg | Freq: Once | INTRAVENOUS | Status: AC
  Administered 2022-12-27: 10 mg via INTRAVENOUS
  Filled 2022-12-27: qty 10

## 2022-12-27 MED ORDER — MORPHINE SULFATE ER 15 MG PO TBCR: 15.0000 mg | EXTENDED_RELEASE_TABLET | Freq: Two times a day (BID) | ORAL | 0 refills | Status: DC

## 2022-12-27 MED ORDER — OXALIPLATIN CHEMO INJECTION 100 MG/20ML
70.0000 mg/m2 | Freq: Once | INTRAVENOUS | Status: AC
  Administered 2022-12-27: 120 mg via INTRAVENOUS
  Filled 2022-12-27: qty 4

## 2022-12-27 MED ORDER — FLUOROURACIL CHEMO INJECTION 2.5 GM/50ML
400.0000 mg/m2 | Freq: Once | INTRAVENOUS | Status: AC
  Administered 2022-12-27: 700 mg via INTRAVENOUS
  Filled 2022-12-27: qty 14

## 2022-12-27 MED ORDER — DIPHENHYDRAMINE HCL 25 MG PO CAPS
50.0000 mg | ORAL_CAPSULE | Freq: Once | ORAL | Status: AC
  Administered 2022-12-27: 50 mg via ORAL
  Filled 2022-12-27: qty 2

## 2022-12-27 NOTE — Progress Notes (Addendum)
Nutrition Follow-up:  Patient with esophageal adenocarcinoma, stage IV with liver involvement.  Patient receiving folfox and transuzumab.  Met with patient during infusion with wife.  Patient reports that his appetite is a little bit better.  Drinking 2-4 Boost VHC shakes a day.  Reports that he is eating spaghetti last night for dinner, peanut butter and jelly sandwich, soups. Drinking water, powerade.  Having some nausea but taking medication which helps.    Orbital Region: severe Buccal Region: severe Upper Arm Region: severe Thoracic and Lumbar Region: severe Temple Region: severe Clavicle Bone Region: severe Shoulder and Acromion Bone Region: severe Scapular Bone Region: severe Dorsal Hand: mild Patellar Region: moderate Anterior Thigh Region: moderate Posterior Calf Region: mild Edema (RD assessment): none Hair: reviewed Eyes: reviewed Mouth: reviewed Skin: reviewed Nails: reviewed   Medications: reviewed  Labs: reviewed  Anthropometrics:   Weight 132 lb today  135 lb 8 oz on 7/17 154 lb on 8/24   NUTRITION DIAGNOSIS: Inadequate oral intake continues  MALNUTRITION DIAGNOSIS: Patient meets criteria for severe malnutrition in context of chronic illness as evidenced by severe muscle mass loss, and severe fat loss and eating less than 75% estimate energy needs for > 1 month  INTERVENTION:  Recommend patient drinking 4 boost VHC shakes daily (2120 calories/d).   Discussed ways to add calories and protein. Handout provided Continue taking nausea medication Recommend daily MVI    MONITORING, EVALUATION, GOAL: weight trends, intake   NEXT VISIT: Wednesday, August 14 during infusion  Iyahna Obriant B. Freida Busman, RD, LDN Registered Dietitian [phone]

## 2022-12-27 NOTE — Assessment & Plan Note (Signed)
Follow up with neurosurgeon.  Recommend calcium and Vitamin D Consider bone strengthen agent.  Recommend patient to obtain Dental clearance for zometa.

## 2022-12-27 NOTE — Assessment & Plan Note (Signed)
Recommend imodium PRN as directed  

## 2022-12-27 NOTE — Progress Notes (Signed)
Pt here for follow up. No new concerns voiced.   

## 2022-12-27 NOTE — Assessment & Plan Note (Addendum)
Image findings and pathology results are reviewed with patient and wife.  PET scan results were reviewed and discussed with patient.  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  NGS results come back today positive for HER2 copy again. Labs are reviewed and discussed with patient. Proceed with FOLFOX and Transtuzumab.

## 2022-12-27 NOTE — Progress Notes (Signed)
Hematology/Oncology Progress note [phone] [phone]        REFERRING PROVIDER: Rickard Patience, MD    CHIEF COMPLAINTS/PURPOSE OF CONSULTATION:  Esophageal cancer.   ASSESSMENT & PLAN:   Cancer Staging  Esophageal adenocarcinoma [facility]) Staging form: Esophagus - Adenocarcinoma, AJCC 8th Edition - Clinical stage from 11/03/2022: Stage IVB (cTX, cNX, cM1) - Signed by Rickard Patience, MD on 11/03/2022  Malignant neoplasm of urinary bladder [facility]) Staging form: Urinary Bladder, AJCC 7th Edition - Clinical: Stage 0a (Ta, N0, M0) - Signed by Rickard Patience, MD on 11/03/2022   Esophageal adenocarcinoma [facility]) Image findings and pathology results are reviewed with patient and wife.  PET scan results were reviewed and discussed with patient.  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  NGS results come back today positive for HER2 copy again. Labs are reviewed and discussed with patient. Proceed with FOLFOX and Transtuzumab.   Neoplasm related pain Continue follow-up with palliative care service. continue oxycodone 10-20mg  Q 4-6 hours PRN.  MS contin 15mg  BID   Protein calorie malnutrition (HCC) Follow-up with nutritionist Continue nutrition supplements.  Recommend him to hold off Statin.  Continue Megace 20mg  Bid.    Chemotherapy induced diarrhea Recommend imodium PRN as directed   Compression fracture of lumbar vertebra (HCC) Follow up with neurosurgeon.  Recommend calcium and Vitamin D Consider bone strengthen agent.  Recommend patient to obtain Dental clearance for zometa.    Orders Placed This Encounter  Procedures   CEA    Standing Status:   Future    Standing Expiration Date:   01/10/2024   CBC with Differential (Cancer Center Only)    Standing Status:   Future    Standing Expiration Date:   01/10/2024   CMP (Cancer Center only)    Standing Status:   Future    Standing Expiration Date:   01/10/2024   CEA    Standing  Status:   Future    Standing Expiration Date:   01/24/2024   CBC with Differential (Cancer Center Only)    Standing Status:   Future    Standing Expiration Date:   01/24/2024   CMP (Cancer Center only)    Standing Status:   Future    Standing Expiration Date:   01/24/2024   CEA    Standing Status:   Future    Standing Expiration Date:   02/07/2024   CBC with Differential (Cancer Center Only)    Standing Status:   Future    Standing Expiration Date:   02/07/2024   CMP (Cancer Center only)    Standing Status:   Future    Standing Expiration Date:   02/07/2024     Follow-up in 2 weeks for lab MD FOLFOX and trastuzumab. All questions were answered. The patient knows to call the clinic with any problems, questions or concerns.  Rickard Patience, MD, PhD [facility] Hematology Oncology 12/27/2022    HISTORY OF PRESENTING ILLNESS:  Aaron Short 77 y.o. male presents to establish care for esophageal adenocarcinoma.  I have reviewed his chart and materials related to his cancer extensively and collaborated history with the patient. Summary of oncologic history is as follows: Oncology History  Malignant neoplasm of urinary bladder (HCC)  10/23/2013 Initial Diagnosis   Malignant neoplasm of urinary bladder (HCC)   11/03/2022 Cancer Staging   Staging form: Urinary Bladder, AJCC 7th Edition - Clinical: Stage 0a (Ta, N0, M0) - Signed by Rickard Patience, MD on 11/03/2022   Esophageal  adenocarcinoma (HCC)  10/20/2022 Imaging   CT abdomen pelvis w contrast  -Multiple bilobar hepatic lesions extending throughout most of the liver parenchyma and concerning for metastatic disease. Correlation with tissue sampling could be considered for definitive pathologic diagnosis.   - Circumferential wall thickening of the distal esophagus and gastroesophageal junction. Recommend further evaluation with endoscopy to exclude underlying malignancy. No other evidence of primary malignancy in the abdomen or pelvis although note  is made that evaluation of the pelvic structures is very limited in this CT scan due to extensive artifact secondary to the patient's bilateral total hip arthroplasty.   - Cystic lesion in the pancreatic tail measuring up to 1.0 cm, possibly a sidebranch IPMN. No obvious pancreatic ductal dilatation. Further evaluation with MRI/MRCP could be considered.   - Extensive degenerative changes to the spine with compression deformities of T11, T12 and L2, similar to prior.    10/20/2022 Imaging   CT chest w contrast   New 4 mm nodule in the posterior segment of left lower lobe, 3 mm nodule in the left upper chest of pulmonary metastasis.   Stable mild centrilobular emphysema and mild peripheral reticulation which may reflect smoking-related lung fibrosis.   Mildly dilated esophagus with air-fluid level and circumferential thickening in the distal esophagus which may reflect chronic esophagitis. Consider endoscopic evaluation.    11/03/2022 Initial Diagnosis   Esophageal adenocarcinoma   + dysphagia for both liquid and solid food, cough after eating. Unintentional weight loss 50 pounds over the past years, 14 pounds within last year.   10/30/22 EGD showed A large, submucosal mass with no bleeding and stigmata of recent bleeding was found in the lower third of the esophagus, 35 to 40 cm from the incisors. The mass was partially obstructing and circumferential. Biopsies were taken with a cold forceps for histology.  Pathology showed moderately differentiated adenocarcinoma involving squamocolumnar junctional mucosa with focal intestinal metaplasia.   [facility] NGS showed ERBB2 copy number gain, TP53 stop gain, NSD1 frameshift, RARA copy number gain, TOP2A copy number gain. TMB 7.9 m/mb, MSI stable.    11/03/2022 Cancer Staging   Staging form: Esophagus - Adenocarcinoma, AJCC 8th Edition - Clinical stage from 11/03/2022: Stage IVB (cTX, cNX, cM1) - Signed by Rickard Patience, MD on 11/03/2022 Stage prefix: Initial  diagnosis   11/13/2022 Imaging   PET scan showed 1. Tracer avid tumor is identified within the distal esophagus extending into the cardia. 2. Extensive tracer avid liver metastases. 3. Tiny nodule within the medial left lower lobe is too small to characterize by PET-CT measuring 4 mm. Attention to this nodule on future surveillance imaging is advised. 4. Asymmetric uptake localizing to the right first rib costosternal junction and left AC joint is noted. Findings are favored to represent arthropathic change 5. Gallstones. 6.  Aortic Atherosclerosis   11/13/2022 Procedure   Mediport placement by IR   11/15/2022 - 11/17/2022 Chemotherapy   Patient is on Treatment Plan : GASTROESOPHAGEAL FOLFOX q14d x 6 cycles     11/29/2022 -  Chemotherapy   Patient is on Treatment Plan : GASTROESOPHAGEAL Trastuzumab (6/4) D1 + FOLFOX D1 q14d x 12 cycles / Trastuzumab q14d      He is a current every day smoker. He denies abdominal pain, nausea vomiting. Gait is not steady, frequent falls, this is a chronic issue.  Aifb on Xarelto. He currently is able to eat soft moist food.  + chronic neck and back pain, joint pain, he takes oxycodone 10-20mg  every 4-6 hours.  Pain is controlled better.   During the interval he had a fall and was admitted. Closed compression fracture of lumbar vertebral body, traumatic rhabdomyolysis.  Discharged on 12/05/2022  + Dysuria for about 6 weeks. Chronically increased urinary urgency. UA urine culture negative.   INTERVAL HISTORY Xaden Ritzman is a 77 y.o. male who has above history reviewed by me today presents for follow up visit for esophageal cancer.   Appetite is slightly better,oral intake has improved. weight decreased 2-3 pounds. He takes megace 20mg  BID + nasuea. No vomiting.  Diarrhea improved. Pain is controlled with current pain regimen.     MEDICAL HISTORY:  Past Medical History:  Diagnosis Date   Arthralgia of lower leg 04/02/2012   Arthralgia of upper  arm 06/18/2009   Cancer (HCC)    Diabetes mellitus without complication (HCC)    FH: atrial fibrillation    Hypertension     SURGICAL HISTORY: Past Surgical History:  Procedure Laterality Date   bladder cancer surgery  2015   ESOPHAGOGASTRODUODENOSCOPY (EGD) WITH PROPOFOL N/A 10/30/2022   Procedure: ESOPHAGOGASTRODUODENOSCOPY (EGD) WITH PROPOFOL;  Surgeon: Toney Reil, MD;  Location: [facility] ENDOSCOPY;  Service: Gastroenterology;  Laterality: N/A;   IR IMAGING GUIDED PORT INSERTION  11/13/2022   JOINT REPLACEMENT     REPLACEMENT TOTAL HIP W/  RESURFACING IMPLANTS Bilateral    SHOULDER SURGERY Bilateral    TOTAL KNEE ARTHROPLASTY Bilateral     SOCIAL HISTORY: Social History   Socioeconomic History   Marital status: Married    Spouse name: Not on file   Number of children: Not on file   Years of education: Not on file   Highest education level: Not on file  Occupational History   Not on file  Tobacco Use   Smoking status: Every Day    Current packs/day: 0.50    Types: Cigarettes   Smokeless tobacco: Never  Vaping Use   Vaping status: Never Used  Substance and Sexual Activity   Alcohol use: No    Alcohol/week: 0.0 standard drinks of alcohol   Drug use: No   Sexual activity: Not on file  Other Topics Concern   Not on file  Social History Narrative   Lives with wife, [name].    Social Determinants of Health   Financial Resource Strain: Low Risk  (11/03/2022)   Overall Financial Resource Strain (CARDIA)    Difficulty of Paying Living Expenses: Not very hard  Food Insecurity: No Food Insecurity (12/04/2022)   Hunger Vital Sign    Worried About Running Out of Food in the Last Year: Never true    Ran Out of Food in the Last Year: Never true  Transportation Needs: No Transportation Needs (12/04/2022)   PRAPARE - Administrator, Civil Service (Medical): No    Lack of Transportation (Non-Medical): No  Physical Activity: Sufficiently Active (02/16/2021)   Received  from [facility], [facility]   Exercise Vital Sign    Days of Exercise per Week: 7 days    Minutes of Exercise per Session: 150+ min  Stress: No Stress Concern Present (11/03/2022)   [employer] of Occupational Health - Occupational Stress Questionnaire    Feeling of Stress : Only a little  Social Connections: Moderately Integrated (02/16/2021)   [facility] from [facility], [facility]   Social Connection and Isolation Panel [NHANES]    Frequency of Communication with Friends and Family: Three times a week    Frequency of Social Gatherings  with Friends and Family: Three times a week    Attends Religious Services: Never    Active Member of Clubs or Organizations: Yes    Attends Banker Meetings: More than 4 times per year    Marital Status: Married  Catering manager Violence: Not At Risk (12/04/2022)   Humiliation, Afraid, Rape, and Kick questionnaire    Fear of Current or Ex-Partner: No    Emotionally Abused: No    Physically Abused: No    Sexually Abused: No    FAMILY HISTORY: Family History  Problem Relation Age of Onset   Dementia Mother    Heart disease Father     ALLERGIES:  is allergic to diltiazem, penicillins, and tizanidine.  MEDICATIONS:  Current Outpatient Medications  Medication Sig Dispense Refill   B Complex-C (VITAMIN B + C COMPLEX) TABS Take 1 capsule by mouth every morning.     carisoprodol (SOMA) 350 MG tablet Take 350 mg by mouth 2 (two) times daily.      Cetirizine HCl 10 MG CAPS Take 1 capsule by mouth every morning.     dexamethasone (DECADRON) 4 MG tablet Take 2 tablets (8 mg total) by mouth daily. Start the day after chemotherapy for 2 days. Take with food. 30 tablet 1   lidocaine-prilocaine (EMLA) cream Apply to affected area once 30 g 3   loperamide (IMODIUM) 2 MG capsule Take 1 capsule (2 mg total) by mouth See admin instructions. Initial: 4 mg,the 2 mg every 2 hours (4 mg every 4 hours at night)  maximum: 16 mg/day 60  capsule 2   Magnesium 500 MG TABS Take 500 mg by mouth as needed.     melatonin (MELATONIN MAXIMUM STRENGTH) 5 MG TABS Take 5 mg by mouth at bedtime as needed.     omeprazole (PRILOSEC) 10 MG capsule Take 20 mg by mouth daily.     Oxycodone HCl 10 MG TABS Take 1-2 tablets (10-20 mg total) by mouth every 4 (four) hours as needed (pain). 120 tablet 0   prazosin (MINIPRESS) 1 MG capsule Take 2 mg by mouth at bedtime.     rivaroxaban (XARELTO) 20 MG TABS tablet Take 20 mg by mouth daily.     sertraline (ZOLOFT) 50 MG tablet Take 100 mg by mouth at bedtime.     megestrol (MEGACE) 20 MG tablet Take 1 tablet (20 mg total) by mouth 2 (two) times daily. 60 tablet 1   morphine (MS CONTIN) 15 MG 12 hr tablet Take 1 tablet (15 mg total) by mouth every 12 (twelve) hours. 60 tablet 0   naloxone (NARCAN) nasal spray 4 mg/0.1 mL Place 1 spray into the nose once for 1 dose. Spray half of bottle content into each nostril, then call 911 (Patient not taking: Reported on 12/27/2022) 2 each 0   ondansetron (ZOFRAN) 8 MG tablet Take 1 tablet (8 mg total) by mouth every 8 (eight) hours as needed for nausea or vomiting. Start on the third day after chemotherapy. 90 tablet 1   prochlorperazine (COMPAZINE) 10 MG tablet Take 1 tablet (10 mg total) by mouth every 6 (six) hours as needed for nausea or vomiting. 90 tablet 1   No current facility-administered medications for this visit.   Facility-Administered Medications Ordered in Other Visits  Medication Dose Route Frequency Provider Last Rate Last Admin   dextrose 5 % solution   Intravenous Once Rickard Patience, MD       fluorouracil (ADRUCIL) 4,150 mg in sodium chloride 0.9 %  67 mL chemo infusion  2,400 mg/m2 (Treatment Plan Recorded) Intravenous 1 day or 1 dose Rickard Patience, MD       fluorouracil (ADRUCIL) chemo injection 700 mg  400 mg/m2 (Treatment Plan Recorded) Intravenous Once Rickard Patience, MD       leucovorin 692 mg in dextrose 5 % 250 mL infusion  400 mg/m2 (Treatment Plan  Recorded) Intravenous Once Rickard Patience, MD       oxaliplatin (ELOXATIN) 120 mg in dextrose 5 % 500 mL chemo infusion  70 mg/m2 (Treatment Plan Recorded) Intravenous Once Rickard Patience, MD       sodium chloride flush (NS) 0.9 % injection 10 mL  10 mL Intravenous PRN Rickard Patience, MD   10 mL at 12/27/22 4098   sodium chloride flush (NS) 0.9 % injection 10 mL  10 mL Intracatheter PRN Rickard Patience, MD       trastuzumab-anns [facility]) 252 mg in sodium chloride 0.9 % 250 mL chemo infusion  4 mg/kg (Treatment Plan Recorded) Intravenous Once Rickard Patience, MD        Review of Systems  Constitutional:  Positive for appetite change, fatigue and unexpected weight change. Negative for chills and fever.  HENT:   Negative for hearing loss and voice change.   Eyes:  Negative for eye problems and icterus.  Respiratory:  Negative for chest tightness, cough and shortness of breath.   Cardiovascular:  Negative for chest pain and leg swelling.  Gastrointestinal:  Negative for abdominal distention and abdominal pain.       Heart burn  Endocrine: Negative for hot flashes.  Genitourinary:  Negative for difficulty urinating, dysuria and frequency.   Musculoskeletal:  Positive for arthralgias, back pain, gait problem and neck pain.  Skin:  Negative for itching and rash.  Neurological:  Positive for gait problem. Negative for light-headedness and numbness.       Falls.   Hematological:  Negative for adenopathy. Does not bruise/bleed easily.  Psychiatric/Behavioral:  Negative for confusion.      PHYSICAL EXAMINATION: ECOG PERFORMANCE STATUS: 1 - Symptomatic but completely ambulatory  Vitals:   12/27/22 0842  BP: (!) 107/50  Pulse: 65  Resp: 18  Temp: 98.4 F (36.9 C)   Filed Weights   12/27/22 0842  Weight: 132 lb (59.9 kg)    Physical Exam Constitutional:      General: He is not in acute distress.    Appearance: He is ill-appearing. He is not diaphoretic.  HENT:     Head: Normocephalic and atraumatic.  Eyes:      General: No scleral icterus.    Pupils: Pupils are equal, round, and reactive to light.  Cardiovascular:     Rate and Rhythm: Normal rate and regular rhythm.     Heart sounds: No murmur heard. Pulmonary:     Effort: Pulmonary effort is normal. No respiratory distress.     Breath sounds: Normal breath sounds. No wheezing.  Abdominal:     General: There is no distension.     Palpations: Abdomen is soft.     Tenderness: There is no abdominal tenderness.  Musculoskeletal:        General: Normal range of motion.     Cervical back: Normal range of motion and neck supple.  Skin:    General: Skin is warm and dry.     Findings: No erythema.  Neurological:     Mental Status: He is alert and oriented to person, place, and time. Mental status is at baseline.  Cranial Nerves: No cranial nerve deficit.     Motor: No abnormal muscle tone.  Psychiatric:        Mood and Affect: Mood and affect normal.      LABORATORY DATA:  I have reviewed the data as listed    Latest Ref Rng & Units 12/27/2022    8:07 AM 12/13/2022    8:00 AM 12/03/2022   11:44 PM  CBC  WBC 4.0 - 10.5 K/uL 7.0  9.0  3.3   Hemoglobin 13.0 - 17.0 g/dL 23.7  62.8  31.5   Hematocrit 39.0 - 52.0 % 30.8  31.0  33.0   Platelets 150 - 400 K/uL 240  231  283       Latest Ref Rng & Units 12/27/2022    8:07 AM 12/13/2022    8:00 AM 12/05/2022    5:30 AM  CMP  Glucose 70 - 99 mg/dL 176  160  85   BUN 8 - 23 mg/dL 16  17  16    Creatinine 0.61 - 1.24 mg/dL 7.37  1.06  2.69   Sodium 135 - 145 mmol/L 134  133  133   Potassium 3.5 - 5.1 mmol/L 3.7  3.9  4.2   Chloride 98 - 111 mmol/L 99  101  100   CO2 22 - 32 mmol/L 28  26  25    Calcium 8.9 - 10.3 mg/dL 8.7  8.6  8.0   Total Protein 6.5 - 8.1 g/dL 6.4  6.4    Total Bilirubin 0.3 - 1.2 mg/dL 0.5  0.5    Alkaline Phos 38 - 126 U/L 124  121    AST 15 - 41 U/L 35  48    ALT 0 - 44 U/L 18  33       RADIOGRAPHIC STUDIES: I have personally reviewed the radiological images as  listed and agreed with the findings in the report. CT T-SPINE NO CHARGE  Result Date: 12/04/2022 CLINICAL DATA:  Fall. EXAM: CT THORACIC SPINE WITHOUT CONTRAST TECHNIQUE: Multidetector CT images of the thoracic were obtained using the standard protocol without intravenous contrast. RADIATION DOSE REDUCTION: This exam was performed according to the departmental dose-optimization program which includes automated exposure control, adjustment of the mA and/or kV according to patient size and/or use of iterative reconstruction technique. COMPARISON:  Chest CT today. PET CT 11/13/2022. Outside CT 10/20/2022. FINDINGS: Alignment: Normal Vertebrae: Chronic compression fractures involving the T12 and L2 vertebral body are stable since prior CT. Mild compression fracture through the superior endplate of L1 and superior endplate of T11 are new since prior study. Paraspinal and other soft tissues: Negative. Disc levels: Diffuse degenerative disc disease. Anterior and lateral spurring. IMPRESSION: Mild compression fractures through the superior endplates of T11 and L1, new since prior CT. Stable chronic mild compression fractures at T12 and L2. Electronically Signed   By: Charlett Nose M.D.   On: 12/04/2022 01:20   CT CHEST WO CONTRAST  Result Date: 12/04/2022 CLINICAL DATA:  fall, eval rib fx, ptx.  Esophageal cancer. EXAM: CT CHEST WITHOUT CONTRAST TECHNIQUE: Multidetector CT imaging of the chest was performed following the standard protocol without IV contrast. RADIATION DOSE REDUCTION: This exam was performed according to the departmental dose-optimization program which includes automated exposure control, adjustment of the mA and/or kV according to patient size and/or use of iterative reconstruction technique. COMPARISON:  Outside study 10/20/2022.  PET CT 11/13/2022. FINDINGS: Cardiovascular: Coronary artery and aortic calcifications. Heart is normal size. Aorta is normal  caliber. Mediastinum/Nodes: No mediastinal,  hilar, or axillary adenopathy. Trachea and thyroid unremarkable. Mild distal esophageal wall thickening. Lungs/Pleura: Changes of fibrosis peripherally in the lungs. No acute confluent airspace opacities or effusions. No pneumothorax. Upper Abdomen: Numerous hepatic metastases as seen on prior study. Musculoskeletal: No visible rib fracture. Chronic compression fractures noted at T12 and L2. Mild compression fracture through the superior endplate of L1 is new since prior CT. Chest wall soft tissues unremarkable. IMPRESSION: Mild new compression fracture through the superior endplate of L1. Stable chronic compression fractures at T12 and L2. Coronary artery disease, aortic atherosclerosis. Mild wall thickening in the distal esophagus in the area of non esophageal cancer as seen on prior PET CT. Extensive a patent metastases. Fibrosis peripherally in the lungs.  No pneumothorax or effusion. Aortic Atherosclerosis (ICD10-I70.0). Electronically Signed   By: Charlett Nose M.D.   On: 12/04/2022 00:40   CT Lumbar Spine Wo Contrast  Result Date: 12/04/2022 CLINICAL DATA:  Fall, tailbone pain. EXAM: CT LUMBAR SPINE WITHOUT CONTRAST TECHNIQUE: Multidetector CT imaging of the lumbar spine was performed without intravenous contrast administration. Multiplanar CT image reconstructions were also generated. RADIATION DOSE REDUCTION: This exam was performed according to the departmental dose-optimization program which includes automated exposure control, adjustment of the mA and/or kV according to patient size and/or use of iterative reconstruction technique. COMPARISON:  PET CT 11/13/2022. outside CT 10/20/2022 FINDINGS: Segmentation: 5 lumbar type vertebrae. Alignment: Normal. Vertebrae: Moderate compression fractures involving T12 and L2. Mild compression fracture through the superior endplate of L1. Paraspinal and other soft tissues: Negative Disc levels: Diffuse degenerative facet disease. Anterior spurring throughout the  lower thoracic and lumbar spine. Vacuum disc noted. IMPRESSION: Mild to moderate compression fractures involving T12, L1 and L2 vertebral bodies. The T12 and L2 vertebral bodies are chronic, seen on outside CT from 10/20/2022. The L1 compression fracture is new since that study. Electronically Signed   By: Charlett Nose M.D.   On: 12/04/2022 00:35   CT Cervical Spine Wo Contrast  Result Date: 12/04/2022 CLINICAL DATA:  Fall EXAM: CT CERVICAL SPINE WITHOUT CONTRAST TECHNIQUE: Multidetector CT imaging of the cervical spine was performed without intravenous contrast. Multiplanar CT image reconstructions were also generated. RADIATION DOSE REDUCTION: This exam was performed according to the departmental dose-optimization program which includes automated exposure control, adjustment of the mA and/or kV according to patient size and/or use of iterative reconstruction technique. COMPARISON:  None Available. FINDINGS: Alignment: No subluxation Skull base and vertebrae: No acute fracture. No primary bone lesion or focal pathologic process. Soft tissues and spinal canal: No prevertebral fluid or swelling. No visible canal hematoma. Disc levels: Diffuse degenerative disc disease with disc space narrowing and anterior spurring. Bilateral degenerative facet disease. Upper chest: No acute findings.  Biapical scarring. Other: None IMPRESSION: Cervical spondylosis.  No acute bony abnormality. Electronically Signed   By: Charlett Nose M.D.   On: 12/04/2022 00:28   CT HEAD WO CONTRAST ( )  Result Date: 12/04/2022 CLINICAL DATA:  Fall EXAM: CT HEAD WITHOUT CONTRAST TECHNIQUE: Contiguous axial images were obtained from the base of the skull through the vertex without intravenous contrast. RADIATION DOSE REDUCTION: This exam was performed according to the departmental dose-optimization program which includes automated exposure control, adjustment of the mA and/or kV according to patient size and/or use of iterative reconstruction  technique. COMPARISON:  None Available. FINDINGS: Brain: There is atrophy and chronic small vessel disease changes. No acute intracranial abnormality. Specifically, no hemorrhage, hydrocephalus, mass lesion, acute infarction, or  significant intracranial injury. Vascular: No hyperdense vessel or unexpected calcification. Skull: No acute calvarial abnormality. Sinuses/Orbits: No acute findings Other: None IMPRESSION: Atrophy, chronic microvascular disease. No acute intracranial abnormality. Electronically Signed   By: Charlett Nose M.D.   On: 12/04/2022 00:26

## 2022-12-27 NOTE — Patient Instructions (Signed)
[place] [facility]  Discharge Instructions: Thank you for choosing [facility] to provide your oncology and hematology care.  If you have a lab appointment with the Cancer Center, please go directly to the Cancer Center and check in at the registration area.  Wear comfortable clothing and clothing appropriate for easy access to any Portacath or PICC line.   We strive to give you quality time with your provider. You may need to reschedule your appointment if you arrive late (15 or more minutes).  Arriving late affects you and other patients whose appointments are after yours.  Also, if you miss three or more appointments without notifying the office, you may be dismissed from the clinic at the provider's discretion.      For prescription refill requests, have your pharmacy contact our office and allow 72 hours for refills to be completed.    Today you received the following chemotherapy and/or immunotherapy agents : fluorouracil, leucovorin  Oxaliplatin & trastuzumab-anns   To help prevent nausea and vomiting after your treatment, we encourage you to take your nausea medication as directed.  BELOW ARE SYMPTOMS THAT SHOULD BE REPORTED IMMEDIATELY: *FEVER GREATER THAN 100.4 F (38 C) OR HIGHER *CHILLS OR SWEATING *NAUSEA AND VOMITING THAT IS NOT CONTROLLED WITH YOUR NAUSEA MEDICATION *UNUSUAL SHORTNESS OF BREATH *UNUSUAL BRUISING OR BLEEDING *URINARY PROBLEMS (pain or burning when urinating, or frequent urination) *BOWEL PROBLEMS (unusual diarrhea, constipation, pain near the anus) TENDERNESS IN MOUTH AND THROAT WITH OR WITHOUT PRESENCE OF ULCERS (sore throat, sores in mouth, or a toothache) UNUSUAL RASH, SWELLING OR PAIN  UNUSUAL VAGINAL DISCHARGE OR ITCHING   Items with * indicate a potential emergency and should be followed up as soon as possible or go to the Emergency Department if any problems should occur.  Please show the CHEMOTHERAPY ALERT  CARD or IMMUNOTHERAPY ALERT CARD at check-in to the Emergency Department and triage nurse.  Should you have questions after your visit or need to cancel or reschedule your appointment, please contact [place] [facility]  [phone] and follow the prompts.  Office hours are 8:00 a.m. to 4:30 p.m. Monday - Friday. Please note that voicemails left after 4:00 p.m. may not be returned until the following business day.  We are closed weekends and major holidays. You have access to a nurse at all times for urgent questions. Please call the main number to the clinic [phone] and follow the prompts.  For any non-urgent questions, you may also contact your provider using MyChart. We now offer e-Visits for anyone 44 and older to request care online for non-urgent symptoms. For details visit [email or web address].   Also download the MyChart app! Go to the app store, search "MyChart", open the app, select [place], and log in with your MyChart username and password.

## 2022-12-27 NOTE — Assessment & Plan Note (Signed)
Continue follow-up with palliative care service. continue oxycodone 10-20mg  Q 4-6 hours PRN.  MS contin 15mg  BID

## 2022-12-27 NOTE — Assessment & Plan Note (Addendum)
Follow-up with nutritionist Continue nutrition supplements.  Recommend him to hold off Statin.  Continue Megace 20mg  Bid.

## 2022-12-29 ENCOUNTER — Inpatient Hospital Stay: Payer: Medicare Other | Attending: Oncology

## 2022-12-29 VITALS — BP 101/58 | HR 66 | Resp 18

## 2022-12-29 DIAGNOSIS — G893 Neoplasm related pain (acute) (chronic): Secondary | ICD-10-CM | POA: Diagnosis not present

## 2022-12-29 DIAGNOSIS — K1231 Oral mucositis (ulcerative) due to antineoplastic therapy: Secondary | ICD-10-CM | POA: Insufficient documentation

## 2022-12-29 DIAGNOSIS — C7802 Secondary malignant neoplasm of left lung: Secondary | ICD-10-CM | POA: Insufficient documentation

## 2022-12-29 DIAGNOSIS — Z9181 History of falling: Secondary | ICD-10-CM | POA: Insufficient documentation

## 2022-12-29 DIAGNOSIS — C159 Malignant neoplasm of esophagus, unspecified: Secondary | ICD-10-CM | POA: Diagnosis present

## 2022-12-29 DIAGNOSIS — Z79891 Long term (current) use of opiate analgesic: Secondary | ICD-10-CM | POA: Insufficient documentation

## 2022-12-29 DIAGNOSIS — Z5112 Encounter for antineoplastic immunotherapy: Secondary | ICD-10-CM | POA: Diagnosis present

## 2022-12-29 DIAGNOSIS — Z452 Encounter for adjustment and management of vascular access device: Secondary | ICD-10-CM | POA: Insufficient documentation

## 2022-12-29 DIAGNOSIS — F17210 Nicotine dependence, cigarettes, uncomplicated: Secondary | ICD-10-CM | POA: Diagnosis not present

## 2022-12-29 DIAGNOSIS — I4892 Unspecified atrial flutter: Secondary | ICD-10-CM | POA: Insufficient documentation

## 2022-12-29 DIAGNOSIS — K521 Toxic gastroenteritis and colitis: Secondary | ICD-10-CM | POA: Insufficient documentation

## 2022-12-29 DIAGNOSIS — Z8551 Personal history of malignant neoplasm of bladder: Secondary | ICD-10-CM | POA: Diagnosis not present

## 2022-12-29 DIAGNOSIS — Z515 Encounter for palliative care: Secondary | ICD-10-CM | POA: Insufficient documentation

## 2022-12-29 DIAGNOSIS — E46 Unspecified protein-calorie malnutrition: Secondary | ICD-10-CM | POA: Insufficient documentation

## 2022-12-29 DIAGNOSIS — R110 Nausea: Secondary | ICD-10-CM | POA: Diagnosis not present

## 2022-12-29 DIAGNOSIS — Z7952 Long term (current) use of systemic steroids: Secondary | ICD-10-CM | POA: Diagnosis not present

## 2022-12-29 DIAGNOSIS — Z79899 Other long term (current) drug therapy: Secondary | ICD-10-CM | POA: Diagnosis not present

## 2022-12-29 DIAGNOSIS — I4891 Unspecified atrial fibrillation: Secondary | ICD-10-CM | POA: Insufficient documentation

## 2022-12-29 DIAGNOSIS — Z96643 Presence of artificial hip joint, bilateral: Secondary | ICD-10-CM | POA: Diagnosis not present

## 2022-12-29 DIAGNOSIS — R300 Dysuria: Secondary | ICD-10-CM | POA: Insufficient documentation

## 2022-12-29 DIAGNOSIS — Z5111 Encounter for antineoplastic chemotherapy: Secondary | ICD-10-CM | POA: Diagnosis present

## 2022-12-29 DIAGNOSIS — T451X5D Adverse effect of antineoplastic and immunosuppressive drugs, subsequent encounter: Secondary | ICD-10-CM | POA: Insufficient documentation

## 2022-12-29 DIAGNOSIS — C787 Secondary malignant neoplasm of liver and intrahepatic bile duct: Secondary | ICD-10-CM | POA: Insufficient documentation

## 2022-12-29 DIAGNOSIS — Z7901 Long term (current) use of anticoagulants: Secondary | ICD-10-CM | POA: Insufficient documentation

## 2022-12-29 DIAGNOSIS — S32000D Wedge compression fracture of unspecified lumbar vertebra, subsequent encounter for fracture with routine healing: Secondary | ICD-10-CM | POA: Insufficient documentation

## 2022-12-29 MED ORDER — HEPARIN SOD (PORK) LOCK FLUSH 100 UNIT/ML IV SOLN
500.0000 [IU] | Freq: Once | INTRAVENOUS | Status: AC | PRN
  Administered 2022-12-29: 500 [IU]
  Filled 2022-12-29: qty 5

## 2022-12-29 MED ORDER — SODIUM CHLORIDE 0.9% FLUSH
10.0000 mL | INTRAVENOUS | Status: DC | PRN
  Administered 2022-12-29: 10 mL
  Filled 2022-12-29: qty 10

## 2023-01-01 ENCOUNTER — Inpatient Hospital Stay (HOSPITAL_BASED_OUTPATIENT_CLINIC_OR_DEPARTMENT_OTHER): Payer: Medicare Other | Admitting: Hospice and Palliative Medicine

## 2023-01-01 ENCOUNTER — Other Ambulatory Visit: Payer: Self-pay

## 2023-01-01 DIAGNOSIS — C159 Malignant neoplasm of esophagus, unspecified: Secondary | ICD-10-CM | POA: Diagnosis not present

## 2023-01-01 DIAGNOSIS — Z515 Encounter for palliative care: Secondary | ICD-10-CM | POA: Diagnosis not present

## 2023-01-01 DIAGNOSIS — S32010A Wedge compression fracture of first lumbar vertebra, initial encounter for closed fracture: Secondary | ICD-10-CM

## 2023-01-01 DIAGNOSIS — G893 Neoplasm related pain (acute) (chronic): Secondary | ICD-10-CM | POA: Diagnosis not present

## 2023-01-01 MED ORDER — OXYCODONE HCL 10 MG PO TABS: 10.0000 mg | ORAL_TABLET | ORAL | 0 refills | Status: DC | PRN

## 2023-01-01 NOTE — Progress Notes (Signed)
Virtual Visit via Telephone Note  I connected with Aaron Short on 01/01/23 at  1:20 PM EDT by telephone and verified that I am speaking with the correct person using two identifiers.  Location: Patient: Home Provider: Clinic   I discussed the limitations, risks, security and privacy concerns of performing an evaluation and management service by telephone and the availability of in person appointments. I also discussed with the patient that there may be a patient responsible charge related to this service. The patient expressed understanding and agreed to proceed.   History of Present Illness:  Aaron Short is a 76 y.o. male with multiple medical problems including chronic pain followed by pain clinic, recently diagnosed with stage IV esophageal adenocarcinoma.  Palliative care was consulted to address goals and manage ongoing symptoms.   Observations/Objective: Spoke with patient and wife by phone.    Patient says that he has not noticed much difference in pain since starting MS Contin.  He is still required regular utilization of oxycodone around-the-clock but says that pain is overall well-controlled.  Patient currently rates pain as 3 out of 10.  He does request oxycodone refill.  He says he is tolerating pain medications well and denies any adverse effects.  Patient request discontinuation of MS Contin.  We did discuss alternative options for long-acting opioids to try to reduce pill burden but patient would prefer just to stick with oxycodone IR as he feels that is working well.  Patient says he is overall doing much better than when he was initially seen.  He denies any other changes or concerns today.  Assessment and Plan: Neoplasm related pain -refill oxycodone.  PDMP reviewed.  Follow Up Instructions: Follow-up telephone visit 1 month   I discussed the assessment and treatment plan with the patient. The patient was provided an opportunity to ask questions and all  were answered. The patient agreed with the plan and demonstrated an understanding of the instructions.   The patient was advised to call back or seek an in-person evaluation if the symptoms worsen or if the condition fails to improve as anticipated.  I provided 10 minutes of non-face-to-face time during this encounter.   Malachy Moan, NP

## 2023-01-02 ENCOUNTER — Ambulatory Visit
Admission: RE | Admit: 2023-01-02 | Discharge: 2023-01-02 | Disposition: A | Discharge status: Home / Self Care | Payer: Medicare Other | Source: Ambulatory Visit | Attending: Neurosurgery | Admitting: Neurosurgery

## 2023-01-02 ENCOUNTER — Ambulatory Visit (INDEPENDENT_AMBULATORY_CARE_PROVIDER_SITE_OTHER): Payer: Medicare Other | Admitting: Neurosurgery

## 2023-01-02 ENCOUNTER — Ambulatory Visit
Admission: RE | Admit: 2023-01-02 | Discharge: 2023-01-02 | Disposition: A | Discharge status: Home / Self Care | Payer: Medicare Other | Attending: Neurosurgery | Admitting: Neurosurgery

## 2023-01-02 ENCOUNTER — Encounter: Payer: Self-pay | Admitting: Neurosurgery

## 2023-01-02 VITALS — BP 115/60 | Ht 68.0 in | Wt 132.0 lb

## 2023-01-02 DIAGNOSIS — S32010A Wedge compression fracture of first lumbar vertebra, initial encounter for closed fracture: Secondary | ICD-10-CM | POA: Insufficient documentation

## 2023-01-02 NOTE — Progress Notes (Signed)
   DOS: Evaluated in the emergency department on 12/04/2022  HISTORY OF PRESENT ILLNESS: 01/02/2023 [name] is following up today for a L1 compression fracture.  He was seen in the emergency department approximately a month ago.  He was has significant pain at that point but we elected for conservative treatment.  He has a history of 2 prior compression fractures.  He is not having any neurologic symptoms.  He decided against wearing a brace and has been recovering successfully.  He states that his pain has improved significantly.  He has not developed any neurological symptoms.  PHYSICAL EXAMINATION:   Vitals:   01/02/23 1049  BP: 115/60   General: Patient is well developed, well nourished, calm, collected, and in no apparent distress.  NEUROLOGICAL:  General: In no acute distress.  Awake, alert, oriented to person, place, and time. Pupils equal round and reactive to light.   Strength:  Side Iliopsoas Quads Hamstring PF DF EHL  R 5 5 5 5 5 5   L 5 5 5 5 5 5     ROS (Neurologic): Negative except as noted above  IMAGING:   ASSESSMENT/PLAN:  Aaron Short is doing well after being seen in the emergency department for a L1 compression fracture.  At that time he is having a significant amount of pain.  He states that is improved considerably.  He follows up today with lumbar x-rays.  We reviewed these.  Demonstrates the known previous lumbar compression fractures.  At L1 this is again noted.  He does not have any progressive kyphosis, he does have a history of kyphotic deformity that is been present for many years.  At this point he like to avoid any intervention including kyphoplasty/vertebroplasty.  He can follow-up as needed.  No further scheduled imaging needed.  Lovenia Kim, MD/MS Department of Neurosurgery

## 2023-01-03 ENCOUNTER — Ambulatory Visit: Payer: Medicare Other | Admitting: Neurosurgery

## 2023-01-10 ENCOUNTER — Inpatient Hospital Stay: Payer: Medicare Other

## 2023-01-10 ENCOUNTER — Inpatient Hospital Stay (HOSPITAL_BASED_OUTPATIENT_CLINIC_OR_DEPARTMENT_OTHER): Payer: Medicare Other | Admitting: Oncology

## 2023-01-10 ENCOUNTER — Encounter: Payer: Self-pay | Admitting: Oncology

## 2023-01-10 VITALS — BP 115/50 | HR 67 | Temp 97.7°F | Resp 18 | Wt 131.1 lb

## 2023-01-10 DIAGNOSIS — G893 Neoplasm related pain (acute) (chronic): Secondary | ICD-10-CM

## 2023-01-10 DIAGNOSIS — K521 Toxic gastroenteritis and colitis: Secondary | ICD-10-CM

## 2023-01-10 DIAGNOSIS — C159 Malignant neoplasm of esophagus, unspecified: Secondary | ICD-10-CM

## 2023-01-10 DIAGNOSIS — R110 Nausea: Secondary | ICD-10-CM

## 2023-01-10 DIAGNOSIS — Z5112 Encounter for antineoplastic immunotherapy: Secondary | ICD-10-CM | POA: Diagnosis not present

## 2023-01-10 DIAGNOSIS — T451X5A Adverse effect of antineoplastic and immunosuppressive drugs, initial encounter: Secondary | ICD-10-CM

## 2023-01-10 DIAGNOSIS — K1230 Oral mucositis (ulcerative), unspecified: Secondary | ICD-10-CM | POA: Diagnosis not present

## 2023-01-10 DIAGNOSIS — S32000D Wedge compression fracture of unspecified lumbar vertebra, subsequent encounter for fracture with routine healing: Secondary | ICD-10-CM

## 2023-01-10 DIAGNOSIS — E46 Unspecified protein-calorie malnutrition: Secondary | ICD-10-CM

## 2023-01-10 LAB — CBC WITH DIFFERENTIAL (CANCER CENTER ONLY)
Abs Immature Granulocytes: 0.02 10*3/uL (ref 0.00–0.07)
Basophils Absolute: 0.1 10*3/uL (ref 0.0–0.1)
Basophils Relative: 1 %
Eosinophils Absolute: 0.1 10*3/uL (ref 0.0–0.5)
Eosinophils Relative: 2 %
HCT: 31.5 % — ABNORMAL LOW (ref 39.0–52.0)
Hemoglobin: 10.7 g/dL — ABNORMAL LOW (ref 13.0–17.0)
Immature Granulocytes: 0 %
Lymphocytes Relative: 30 %
Lymphs Abs: 2.2 10*3/uL (ref 0.7–4.0)
MCH: 33.5 pg (ref 26.0–34.0)
MCHC: 34 g/dL (ref 30.0–36.0)
MCV: 98.7 fL (ref 80.0–100.0)
Monocytes Absolute: 1 10*3/uL (ref 0.1–1.0)
Monocytes Relative: 14 %
Neutro Abs: 3.8 10*3/uL (ref 1.7–7.7)
Neutrophils Relative %: 53 %
Platelet Count: 206 10*3/uL (ref 150–400)
RBC: 3.19 MIL/uL — ABNORMAL LOW (ref 4.22–5.81)
RDW: 17.2 % — ABNORMAL HIGH (ref 11.5–15.5)
WBC Count: 7.2 10*3/uL (ref 4.0–10.5)
nRBC: 0 % (ref 0.0–0.2)

## 2023-01-10 LAB — CMP (CANCER CENTER ONLY)
ALT: 20 U/L (ref 0–44)
AST: 34 U/L (ref 15–41)
Albumin: 3.1 g/dL — ABNORMAL LOW (ref 3.5–5.0)
Alkaline Phosphatase: 113 U/L (ref 38–126)
Anion gap: 8 (ref 5–15)
BUN: 15 mg/dL (ref 8–23)
CO2: 27 mmol/L (ref 22–32)
Calcium: 8.9 mg/dL (ref 8.9–10.3)
Chloride: 99 mmol/L (ref 98–111)
Creatinine: 0.67 mg/dL (ref 0.61–1.24)
GFR, Estimated: >60 mL/min (ref >60)
Glucose, Bld: 143 mg/dL — ABNORMAL HIGH (ref 70–99)
Potassium: 3.7 mmol/L (ref 3.5–5.1)
Sodium: 134 mmol/L — ABNORMAL LOW (ref 135–145)
Total Bilirubin: 0.4 mg/dL (ref 0.3–1.2)
Total Protein: 6.4 g/dL — ABNORMAL LOW (ref 6.5–8.1)

## 2023-01-10 MED ORDER — MAGIC MOUTHWASH W/LIDOCAINE: 5.0000 mL | Freq: Four times a day (QID) | ORAL | 1 refills | Status: AC | PRN

## 2023-01-10 MED ORDER — LORAZEPAM 0.5 MG PO TABS: 0.5000 mg | ORAL_TABLET | Freq: Three times a day (TID) | ORAL | 0 refills | Status: DC | PRN

## 2023-01-10 MED ORDER — SODIUM CHLORIDE 0.9 % IV SOLN
Freq: Once | INTRAVENOUS | Status: AC
  Filled 2023-01-10: qty 250

## 2023-01-10 MED ORDER — SODIUM CHLORIDE 0.9% FLUSH
10.0000 mL | Freq: Once | INTRAVENOUS | Status: AC
  Administered 2023-01-10: 10 mL via INTRAVENOUS
  Filled 2023-01-10: qty 10

## 2023-01-10 MED ORDER — TRASTUZUMAB-ANNS CHEMO 150 MG IV SOLR
4.0000 mg/kg | Freq: Once | INTRAVENOUS | Status: AC
  Administered 2023-01-10: 252 mg via INTRAVENOUS
  Filled 2023-01-10: qty 12

## 2023-01-10 MED ORDER — SODIUM CHLORIDE 0.9 % IV SOLN
2400.0000 mg/m2 | INTRAVENOUS | Status: DC
  Administered 2023-01-10: 4150 mg via INTRAVENOUS
  Filled 2023-01-10: qty 83

## 2023-01-10 MED ORDER — DIPHENHYDRAMINE HCL 25 MG PO CAPS
50.0000 mg | ORAL_CAPSULE | Freq: Once | ORAL | Status: AC
  Administered 2023-01-10: 50 mg via ORAL
  Filled 2023-01-10: qty 2

## 2023-01-10 MED ORDER — ACETAMINOPHEN 325 MG PO TABS
650.0000 mg | ORAL_TABLET | Freq: Once | ORAL | Status: AC
  Administered 2023-01-10: 650 mg via ORAL
  Filled 2023-01-10: qty 2

## 2023-01-10 MED ORDER — HEPARIN SOD (PORK) LOCK FLUSH 100 UNIT/ML IV SOLN
500.0000 [IU] | Freq: Once | INTRAVENOUS | Status: DC
  Filled 2023-01-10: qty 5

## 2023-01-10 MED ORDER — PALONOSETRON HCL INJECTION 0.25 MG/5ML
0.2500 mg | Freq: Once | INTRAVENOUS | Status: AC
  Administered 2023-01-10: 0.25 mg via INTRAVENOUS
  Filled 2023-01-10: qty 5

## 2023-01-10 MED ORDER — FLUOROURACIL CHEMO INJECTION 2.5 GM/50ML
400.0000 mg/m2 | Freq: Once | INTRAVENOUS | Status: AC
  Administered 2023-01-10: 700 mg via INTRAVENOUS
  Filled 2023-01-10: qty 14

## 2023-01-10 MED ORDER — LEUCOVORIN CALCIUM INJECTION 350 MG
400.0000 mg/m2 | Freq: Once | INTRAVENOUS | Status: DC
  Filled 2023-01-10: qty 34.6

## 2023-01-10 MED ORDER — SODIUM CHLORIDE 0.9 % IV SOLN
10.0000 mg | Freq: Once | INTRAVENOUS | Status: AC
  Administered 2023-01-10: 10 mg via INTRAVENOUS
  Filled 2023-01-10: qty 10

## 2023-01-10 MED ORDER — LEUCOVORIN CALCIUM INJECTION 350 MG
400.0000 mg/m2 | Freq: Once | INTRAVENOUS | Status: AC
  Administered 2023-01-10: 692 mg via INTRAVENOUS
  Filled 2023-01-10: qty 34.6

## 2023-01-10 MED ORDER — DEXTROSE 5 % IV SOLN
Freq: Once | INTRAVENOUS | Status: AC
  Filled 2023-01-10: qty 250

## 2023-01-10 MED ORDER — OXALIPLATIN CHEMO INJECTION 100 MG/20ML
70.0000 mg/m2 | Freq: Once | INTRAVENOUS | Status: AC
  Administered 2023-01-10: 120 mg via INTRAVENOUS
  Filled 2023-01-10: qty 20

## 2023-01-10 NOTE — Assessment & Plan Note (Signed)
Antiemetics PRN Recommend trials of Ativan 0.5mg  Q8hours PRN nausea.  Rationale and side effects were reviewed with patient.

## 2023-01-10 NOTE — Assessment & Plan Note (Signed)
 Follow up with neurosurgeon.  Recommend calcium and Vitamin D Consider bone strengthen agent.  Recommend patient to obtain Dental clearance for zometa.

## 2023-01-10 NOTE — Progress Notes (Signed)
Pt here for follow up. He reports that he has soreness to roof of mouth.

## 2023-01-10 NOTE — Assessment & Plan Note (Signed)
Recommend imodium PRN as directed  

## 2023-01-10 NOTE — Assessment & Plan Note (Signed)
Magic mouth wash swish and spit.

## 2023-01-10 NOTE — Assessment & Plan Note (Addendum)
Image findings and pathology results are reviewed with patient and wife.  PET scan results were reviewed and discussed with patient.  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  NGS positive for HER2 copy again. Labs are reviewed and discussed with patient. Proceed with FOLFOX and Transtuzumab.  Repeat CT after 6 cycles.

## 2023-01-10 NOTE — Progress Notes (Signed)
Hematology/Oncology Progress note [phone] [phone]        REFERRING PROVIDER: Rickard Patience, MD    CHIEF COMPLAINTS/PURPOSE OF CONSULTATION:  Esophageal cancer.   ASSESSMENT & PLAN:   Cancer Staging  Esophageal adenocarcinoma [facility]) Staging form: Esophagus - Adenocarcinoma, AJCC 8th Edition - Clinical stage from 11/03/2022: Stage IVB (cTX, cNX, cM1) - Signed by Rickard Patience, MD on 11/03/2022  Malignant neoplasm of urinary bladder [facility]) Staging form: Urinary Bladder, AJCC 7th Edition - Clinical: Stage 0a (Ta, N0, M0) - Signed by Rickard Patience, MD on 11/03/2022   Esophageal adenocarcinoma [facility]) Image findings and pathology results are reviewed with patient and wife.  PET scan results were reviewed and discussed with patient.  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  NGS positive for HER2 copy again. Labs are reviewed and discussed with patient. Proceed with FOLFOX and Transtuzumab.  Repeat CT after 6 cycles.   Neoplasm related pain Continue follow-up with palliative care service. continue oxycodone 10-20mg  Q 4-6 hours PRN.  MS contin 15mg  BID   Protein calorie malnutrition (HCC) Follow-up with nutritionist Continue nutrition supplements.  Recommend him to hold off Statin.  Continue Megace 20mg  Bid.    Compression fracture of lumbar vertebra (HCC) Follow up with neurosurgeon.  Recommend calcium and Vitamin D Consider bone strengthen agent.  Recommend patient to obtain Dental clearance for zometa.   Chemotherapy-induced nausea Antiemetics PRN Recommend trials of Ativan 0.5mg  Q8hours PRN nausea.  Rationale and side effects were reviewed with patient.    Mucositis Magic mouth wash swish and spit.   Chemotherapy induced diarrhea Recommend imodium PRN as directed    No orders of the defined types were placed in this encounter.    Follow-up in 2 weeks for lab MD FOLFOX and trastuzumab. All questions were  answered. The patient knows to call the clinic with any problems, questions or concerns.  Rickard Patience, MD, PhD [facility] Hematology Oncology 01/10/2023    HISTORY OF PRESENTING ILLNESS:  Aaron Short 77 y.o. male presents to establish care for esophageal adenocarcinoma.  I have reviewed his chart and materials related to his cancer extensively and collaborated history with the patient. Summary of oncologic history is as follows: Oncology History  Malignant neoplasm of urinary bladder (HCC)  10/23/2013 Initial Diagnosis   Malignant neoplasm of urinary bladder (HCC)   11/03/2022 Cancer Staging   Staging form: Urinary Bladder, AJCC 7th Edition - Clinical: Stage 0a (Ta, N0, M0) - Signed by Rickard Patience, MD on 11/03/2022   Esophageal adenocarcinoma (HCC)  10/20/2022 Imaging   CT abdomen pelvis w contrast  -Multiple bilobar hepatic lesions extending throughout most of the liver parenchyma and concerning for metastatic disease. Correlation with tissue sampling could be considered for definitive pathologic diagnosis.   - Circumferential wall thickening of the distal esophagus and gastroesophageal junction. Recommend further evaluation with endoscopy to exclude underlying malignancy. No other evidence of primary malignancy in the abdomen or pelvis although note is made that evaluation of the pelvic structures is very limited in this CT scan due to extensive artifact secondary to the patient's bilateral total hip arthroplasty.   - Cystic lesion in the pancreatic tail measuring up to 1.0 cm, possibly a sidebranch IPMN. No obvious pancreatic ductal dilatation. Further evaluation with MRI/MRCP could be considered.   - Extensive degenerative changes to the spine with compression deformities of T11, T12 and L2, similar to prior.    10/20/2022 Imaging   CT chest w contrast  New 4 mm nodule in the posterior segment of left lower lobe, 3 mm nodule in the left upper chest of pulmonary metastasis.   Stable  mild centrilobular emphysema and mild peripheral reticulation which may reflect smoking-related lung fibrosis.   Mildly dilated esophagus with air-fluid level and circumferential thickening in the distal esophagus which may reflect chronic esophagitis. Consider endoscopic evaluation.    11/03/2022 Initial Diagnosis   Esophageal adenocarcinoma   + dysphagia for both liquid and solid food, cough after eating. Unintentional weight loss 50 pounds over the past years, 14 pounds within last year.   10/30/22 EGD showed A large, submucosal mass with no bleeding and stigmata of recent bleeding was found in the lower third of the esophagus, 35 to 40 cm from the incisors. The mass was partially obstructing and circumferential. Biopsies were taken with a cold forceps for histology.  Pathology showed moderately differentiated adenocarcinoma involving squamocolumnar junctional mucosa with focal intestinal metaplasia.   [facility] NGS showed ERBB2 copy number gain, TP53 stop gain, NSD1 frameshift, RARA copy number gain, TOP2A copy number gain. TMB 7.9 m/mb, MSI stable.    11/03/2022 Cancer Staging   Staging form: Esophagus - Adenocarcinoma, AJCC 8th Edition - Clinical stage from 11/03/2022: Stage IVB (cTX, cNX, cM1) - Signed by Rickard Patience, MD on 11/03/2022 Stage prefix: Initial diagnosis   11/13/2022 Imaging   PET scan showed 1. Tracer avid tumor is identified within the distal esophagus extending into the cardia. 2. Extensive tracer avid liver metastases. 3. Tiny nodule within the medial left lower lobe is too small to characterize by PET-CT measuring 4 mm. Attention to this nodule on future surveillance imaging is advised. 4. Asymmetric uptake localizing to the right first rib costosternal junction and left AC joint is noted. Findings are favored to represent arthropathic change 5. Gallstones. 6.  Aortic Atherosclerosis   11/13/2022 Procedure   Mediport placement by IR   11/15/2022 - 11/17/2022 Chemotherapy    Patient is on Treatment Plan : GASTROESOPHAGEAL FOLFOX q14d x 6 cycles     11/29/2022 -  Chemotherapy   Patient is on Treatment Plan : GASTROESOPHAGEAL Trastuzumab (6/4) D1 + FOLFOX D1 q14d x 12 cycles / Trastuzumab q14d      He is a current every day smoker. He denies abdominal pain, nausea vomiting. Gait is not steady, frequent falls, this is a chronic issue.  Aifb on Xarelto. He currently is able to eat soft moist food.  + chronic neck and back pain, joint pain, he takes oxycodone 10-20mg  every 4-6 hours. Pain is controlled better.   During the interval he had a fall and was admitted. Closed compression fracture of lumbar vertebral body, traumatic rhabdomyolysis.  Discharged on 12/05/2022  + Dysuria for about 6 weeks. Chronically increased urinary urgency. UA urine culture negative.   INTERVAL HISTORY Aaron Short is a 77 y.o. male who has above history reviewed by me today presents for follow up visit for esophageal cancer.   Appetite is slightly better,oral intake has improved. weight decreased 2-3 pounds. He takes megace 20mg  BID + nausea daily. No vomiting. Antiemetic helps Diarrhea improved. Pain is controlled with current pain regimen.     MEDICAL HISTORY:  Past Medical History:  Diagnosis Date   Arthralgia of lower leg 04/02/2012   Arthralgia of upper arm 06/18/2009   Cancer (HCC)    Diabetes mellitus without complication (HCC)    FH: atrial fibrillation    Hypertension     SURGICAL HISTORY: Past Surgical History:  Procedure  Laterality Date   bladder cancer surgery  2015   ESOPHAGOGASTRODUODENOSCOPY (EGD) WITH PROPOFOL N/A 10/30/2022   Procedure: ESOPHAGOGASTRODUODENOSCOPY (EGD) WITH PROPOFOL;  Surgeon: Toney Reil, MD;  Location: [facility] ENDOSCOPY;  Service: Gastroenterology;  Laterality: N/A;   IR IMAGING GUIDED PORT INSERTION  11/13/2022   JOINT REPLACEMENT     REPLACEMENT TOTAL HIP W/  RESURFACING IMPLANTS Bilateral    SHOULDER SURGERY Bilateral    TOTAL  KNEE ARTHROPLASTY Bilateral     SOCIAL HISTORY: Social History   Socioeconomic History   Marital status: Married    Spouse name: Not on file   Number of children: Not on file   Years of education: Not on file   Highest education level: Not on file  Occupational History   Not on file  Tobacco Use   Smoking status: Every Day    Current packs/day: 0.50    Types: Cigarettes   Smokeless tobacco: Never  Vaping Use   Vaping status: Never Used  Substance and Sexual Activity   Alcohol use: No    Alcohol/week: 0.0 standard drinks of alcohol   Drug use: No   Sexual activity: Not on file  Other Topics Concern   Not on file  Social History Narrative   Lives with wife, [name].    Social Determinants of Health   Financial Resource Strain: Low Risk  (11/03/2022)   Overall Financial Resource Strain (CARDIA)    Difficulty of Paying Living Expenses: Not very hard  Food Insecurity: No Food Insecurity (12/04/2022)   Hunger Vital Sign    Worried About Running Out of Food in the Last Year: Never true    Ran Out of Food in the Last Year: Never true  Transportation Needs: No Transportation Needs (12/04/2022)   PRAPARE - Administrator, Civil Service (Medical): No    Lack of Transportation (Non-Medical): No  Physical Activity: Sufficiently Active (02/16/2021)   Received from [facility], [facility]   Exercise Vital Sign    Days of Exercise per Week: 7 days    Minutes of Exercise per Session: 150+ min  Stress: No Stress Concern Present (11/03/2022)   [employer] of Occupational Health - Occupational Stress Questionnaire    Feeling of Stress : Only a little  Social Connections: Moderately Integrated (02/16/2021)   Received from [facility], [facility]   Social Connection and Isolation Panel [NHANES]    Frequency of Communication with Friends and Family: Three times a week    Frequency of Social Gatherings with Friends and Family: Three times a week    Attends  Religious Services: Never    Active Member of Clubs or Organizations: Yes    Attends Banker Meetings: More than 4 times per year    Marital Status: Married  Catering manager Violence: Not At Risk (12/04/2022)   Humiliation, Afraid, Rape, and Kick questionnaire    Fear of Current or Ex-Partner: No    Emotionally Abused: No    Physically Abused: No    Sexually Abused: No    FAMILY HISTORY: Family History  Problem Relation Age of Onset   Dementia Mother    Heart disease Father     ALLERGIES:  is allergic to diltiazem, penicillins, and tizanidine.  MEDICATIONS:  Current Outpatient Medications  Medication Sig Dispense Refill   B Complex-C (VITAMIN B + C COMPLEX) TABS Take 1 capsule by mouth every morning.     carisoprodol (SOMA) 350 MG tablet Take 350  mg by mouth 2 (two) times daily.      Cetirizine HCl 10 MG CAPS Take 1 capsule by mouth every morning.     dexamethasone (DECADRON) 4 MG tablet Take 2 tablets (8 mg total) by mouth daily. Start the day after chemotherapy for 2 days. Take with food. 30 tablet 1   lidocaine-prilocaine (EMLA) cream Apply to affected area once 30 g 3   loperamide (IMODIUM) 2 MG capsule Take 1 capsule (2 mg total) by mouth See admin instructions. Initial: 4 mg,the 2 mg every 2 hours (4 mg every 4 hours at night)  maximum: 16 mg/day 60 capsule 2   LORazepam (ATIVAN) 0.5 MG tablet Take 1 tablet (0.5 mg total) by mouth every 8 (eight) hours as needed (nausea). 90 tablet 0   magic mouthwash w/lidocaine SOLN Take 5 mLs by mouth 4 (four) times daily as needed for mouth pain. Sig: Swish/Swallow 5-10 ml four times a day as needed. Dispense 480 ml. 1RF 480 mL 1   Magnesium 500 MG TABS Take 500 mg by mouth as needed.     megestrol (MEGACE) 20 MG tablet Take 1 tablet (20 mg total) by mouth 2 (two) times daily. 60 tablet 1   melatonin (MELATONIN MAXIMUM STRENGTH) 5 MG TABS Take 5 mg by mouth at bedtime as needed.     omeprazole (PRILOSEC) 10 MG capsule Take 20  mg by mouth daily.     ondansetron (ZOFRAN) 8 MG tablet Take 1 tablet (8 mg total) by mouth every 8 (eight) hours as needed for nausea or vomiting. Start on the third day after chemotherapy. 90 tablet 1   Oxycodone HCl 10 MG TABS Take 1-2 tablets (10-20 mg total) by mouth every 4 (four) hours as needed (pain). 120 tablet 0   prazosin (MINIPRESS) 1 MG capsule Take 2 mg by mouth at bedtime.     prochlorperazine (COMPAZINE) 10 MG tablet Take 1 tablet (10 mg total) by mouth every 6 (six) hours as needed for nausea or vomiting. 90 tablet 1   rivaroxaban (XARELTO) 20 MG TABS tablet Take 20 mg by mouth daily.     sertraline (ZOLOFT) 50 MG tablet Take 100 mg by mouth at bedtime.     naloxone (NARCAN) nasal spray 4 mg/0.1 mL Place 1 spray into the nose once for 1 dose. Spray half of bottle content into each nostril, then call 911 (Patient not taking: Reported on 12/27/2022) 2 each 0   No current facility-administered medications for this visit.   Facility-Administered Medications Ordered in Other Visits  Medication Dose Route Frequency Provider Last Rate Last Admin   fluorouracil (ADRUCIL) 4,150 mg in sodium chloride 0.9 % 67 mL chemo infusion  2,400 mg/m2 (Treatment Plan Recorded) Intravenous 1 day or 1 dose Rickard Patience, MD       fluorouracil (ADRUCIL) chemo injection 700 mg  400 mg/m2 (Treatment Plan Recorded) Intravenous Once Rickard Patience, MD       heparin lock flush 100 unit/mL  500 Units Intravenous Once Rickard Patience, MD       leucovorin 692 mg in dextrose 5 % 250 mL infusion  400 mg/m2 Intravenous Once Rickard Patience, MD 142 mL/hr at 01/10/23 1126 692 mg at 01/10/23 1126   oxaliplatin (ELOXATIN) 120 mg in dextrose 5 % 500 mL chemo infusion  70 mg/m2 (Treatment Plan Recorded) Intravenous Once Rickard Patience, MD 262 mL/hr at 01/10/23 1128 120 mg at 01/10/23 1128    Review of Systems  Constitutional:  Positive for appetite change,  fatigue and unexpected weight change. Negative for chills and fever.  HENT:   Negative for  hearing loss and voice change.   Eyes:  Negative for eye problems and icterus.  Respiratory:  Negative for chest tightness, cough and shortness of breath.   Cardiovascular:  Negative for chest pain and leg swelling.  Gastrointestinal:  Negative for abdominal distention and abdominal pain.       Heart burn  Endocrine: Negative for hot flashes.  Genitourinary:  Negative for difficulty urinating, dysuria and frequency.   Musculoskeletal:  Positive for arthralgias, back pain, gait problem and neck pain.  Skin:  Negative for itching and rash.  Neurological:  Positive for gait problem. Negative for light-headedness and numbness.       Falls.   Hematological:  Negative for adenopathy. Does not bruise/bleed easily.  Psychiatric/Behavioral:  Negative for confusion.      PHYSICAL EXAMINATION: ECOG PERFORMANCE STATUS: 1 - Symptomatic but completely ambulatory  Vitals:   01/10/23 0855  BP: (!) 115/50  Pulse: 67  Resp: 18  Temp: 97.7 F (36.5 C)   Filed Weights   01/10/23 0855  Weight: 131 lb 1.6 oz (59.5 kg)    Physical Exam Constitutional:      General: He is not in acute distress.    Appearance: He is ill-appearing. He is not diaphoretic.  HENT:     Head: Normocephalic and atraumatic.  Eyes:     General: No scleral icterus.    Pupils: Pupils are equal, round, and reactive to light.  Cardiovascular:     Rate and Rhythm: Normal rate and regular rhythm.     Heart sounds: No murmur heard. Pulmonary:     Effort: Pulmonary effort is normal. No respiratory distress.     Breath sounds: Normal breath sounds. No wheezing.  Abdominal:     General: There is no distension.     Palpations: Abdomen is soft.     Tenderness: There is no abdominal tenderness.  Musculoskeletal:        General: Normal range of motion.     Cervical back: Normal range of motion and neck supple.  Skin:    General: Skin is warm and dry.     Findings: No erythema.  Neurological:     Mental Status: He is alert  and oriented to person, place, and time. Mental status is at baseline.     Cranial Nerves: No cranial nerve deficit.     Motor: No abnormal muscle tone.  Psychiatric:        Mood and Affect: Mood and affect normal.      LABORATORY DATA:  I have reviewed the data as listed    Latest Ref Rng & Units 01/10/2023    8:14 AM 12/27/2022    8:07 AM 12/13/2022    8:00 AM  CBC  WBC 4.0 - 10.5 K/uL 7.2  7.0  9.0   Hemoglobin 13.0 - 17.0 g/dL 16.1  09.6  04.5   Hematocrit 39.0 - 52.0 % 31.5  30.8  31.0   Platelets 150 - 400 K/uL 206  240  231       Latest Ref Rng & Units 01/10/2023    8:14 AM 12/27/2022    8:07 AM 12/13/2022    8:00 AM  CMP  Glucose 70 - 99 mg/dL 409  811  914   BUN 8 - 23 mg/dL 15  16  17    Creatinine 0.61 - 1.24 mg/dL 7.82  9.56  2.13  Sodium 135 - 145 mmol/L 134  134  133   Potassium 3.5 - 5.1 mmol/L 3.7  3.7  3.9   Chloride 98 - 111 mmol/L 99  99  101   CO2 22 - 32 mmol/L 27  28  26    Calcium 8.9 - 10.3 mg/dL 8.9  8.7  8.6   Total Protein 6.5 - 8.1 g/dL 6.4  6.4  6.4   Total Bilirubin 0.3 - 1.2 mg/dL 0.4  0.5  0.5   Alkaline Phos 38 - 126 U/L 113  124  121   AST 15 - 41 U/L 34  35  48   ALT 0 - 44 U/L 20  18  33      RADIOGRAPHIC STUDIES: I have personally reviewed the radiological images as listed and agreed with the findings in the report. DG Lumbar Spine 2-3 Views  Result Date: 01/07/2023 CLINICAL DATA:  L1 fracture, fell 2 weeks ago EXAM: LUMBAR SPINE - 2-3 VIEW COMPARISON:  12/04/2022, 10/20/2022 FINDINGS: Frontal and lateral views of the lumbar spine are obtained. Mild compression deformity of the superior endplate of L1, as well as the moderate anterior wedge compression deformity of L2, are unchanged since the recent CT exam. No new fractures. Stable multilevel spondylosis and facet hypertrophy, greatest from L1-2 through L3-4. Partial visualization of bilateral hip arthroplasties. IMPRESSION: 1. Stable chronic compression deformities at L1 and L2, unchanged  since the previous exam. 2. Stable multilevel lumbar degenerative change. Electronically Signed   By: Sharlet Salina M.D.   On: 01/07/2023 12:00

## 2023-01-10 NOTE — Progress Notes (Signed)
Nutrition Follow-up:  Patient with esophageal adenocarcinoma, stage IV with liver involvement.  Patient receiving folfox and transuzumab.    Met with patient during infusion.  Reports that sometimes has a taste for food and if doesn't eat it right away looses desire.  Has been drinking 3 boost VHC shakes a day.  Ate cereal yesterday morning.  Also had a doughnut yesterday and ice cream.  Has eaten a peanut butter and jelly sandwich recently.      Medications: megace  Labs: reviewed  Anthropometrics:   Weight 131 lb 1.6 oz  132 lb on 7/31 135 lb 8 oz on 7/17 154 lb on 8/24   NUTRITION DIAGNOSIS: Inadequate oral intake stable  Severe malnutrition continues  INTERVENTION:  Continue boost VHC, ideally 4 per day Continue high calorie foods to help prevent weight loss    MONITORING, EVALUATION, GOAL: weight trends, intake   NEXT VISIT: Wednesday, Sept 11 during infusion   B. Freida Busman, RD, LDN Registered Dietitian [phone]

## 2023-01-10 NOTE — Assessment & Plan Note (Signed)
Continue follow-up with palliative care service. continue oxycodone 10-20mg  Q 4-6 hours PRN.  MS contin 15mg  BID

## 2023-01-10 NOTE — Assessment & Plan Note (Signed)
 Follow-up with nutritionist Continue nutrition supplements.  Recommend him to hold off Statin.  Continue Megace 20mg  Bid.

## 2023-01-11 ENCOUNTER — Encounter: Payer: Medicare Other | Admitting: Student in an Organized Health Care Education/Training Program

## 2023-01-11 ENCOUNTER — Other Ambulatory Visit: Payer: Self-pay

## 2023-01-11 ENCOUNTER — Inpatient Hospital Stay
Admission: EM | Admit: 2023-01-11 | Discharge: 2023-01-14 | DRG: 193 | Disposition: A | Discharge status: Home Health | Payer: Medicare Other | Attending: Internal Medicine | Admitting: Internal Medicine

## 2023-01-11 ENCOUNTER — Emergency Department: Payer: Medicare Other

## 2023-01-11 DIAGNOSIS — R296 Repeated falls: Secondary | ICD-10-CM

## 2023-01-11 DIAGNOSIS — R262 Difficulty in walking, not elsewhere classified: Secondary | ICD-10-CM | POA: Diagnosis present

## 2023-01-11 DIAGNOSIS — Z79818 Long term (current) use of other agents affecting estrogen receptors and estrogen levels: Secondary | ICD-10-CM

## 2023-01-11 DIAGNOSIS — K521 Toxic gastroenteritis and colitis: Secondary | ICD-10-CM | POA: Diagnosis present

## 2023-01-11 DIAGNOSIS — F17210 Nicotine dependence, cigarettes, uncomplicated: Secondary | ICD-10-CM | POA: Diagnosis present

## 2023-01-11 DIAGNOSIS — G894 Chronic pain syndrome: Secondary | ICD-10-CM | POA: Diagnosis present

## 2023-01-11 DIAGNOSIS — E872 Acidosis, unspecified: Secondary | ICD-10-CM | POA: Diagnosis present

## 2023-01-11 DIAGNOSIS — E871 Hypo-osmolality and hyponatremia: Secondary | ICD-10-CM | POA: Diagnosis present

## 2023-01-11 DIAGNOSIS — Z880 Allergy status to penicillin: Secondary | ICD-10-CM

## 2023-01-11 DIAGNOSIS — Z7901 Long term (current) use of anticoagulants: Secondary | ICD-10-CM

## 2023-01-11 DIAGNOSIS — S41112A Laceration without foreign body of left upper arm, initial encounter: Secondary | ICD-10-CM | POA: Diagnosis present

## 2023-01-11 DIAGNOSIS — D649 Anemia, unspecified: Secondary | ICD-10-CM | POA: Diagnosis present

## 2023-01-11 DIAGNOSIS — J1569 Pneumonia due to other gram-negative bacteria: Secondary | ICD-10-CM | POA: Insufficient documentation

## 2023-01-11 DIAGNOSIS — Z1152 Encounter for screening for COVID-19: Secondary | ICD-10-CM

## 2023-01-11 DIAGNOSIS — R4182 Altered mental status, unspecified: Secondary | ICD-10-CM | POA: Diagnosis not present

## 2023-01-11 DIAGNOSIS — W19XXXA Unspecified fall, initial encounter: Secondary | ICD-10-CM | POA: Diagnosis present

## 2023-01-11 DIAGNOSIS — Z9189 Other specified personal risk factors, not elsewhere classified: Secondary | ICD-10-CM

## 2023-01-11 DIAGNOSIS — I482 Chronic atrial fibrillation, unspecified: Secondary | ICD-10-CM | POA: Diagnosis present

## 2023-01-11 DIAGNOSIS — T451X5A Adverse effect of antineoplastic and immunosuppressive drugs, initial encounter: Secondary | ICD-10-CM | POA: Diagnosis present

## 2023-01-11 DIAGNOSIS — C679 Malignant neoplasm of bladder, unspecified: Secondary | ICD-10-CM | POA: Diagnosis present

## 2023-01-11 DIAGNOSIS — Z7952 Long term (current) use of systemic steroids: Secondary | ICD-10-CM

## 2023-01-11 DIAGNOSIS — G9340 Encephalopathy, unspecified: Principal | ICD-10-CM

## 2023-01-11 DIAGNOSIS — G893 Neoplasm related pain (acute) (chronic): Secondary | ICD-10-CM | POA: Diagnosis present

## 2023-01-11 DIAGNOSIS — E119 Type 2 diabetes mellitus without complications: Secondary | ICD-10-CM | POA: Diagnosis present

## 2023-01-11 DIAGNOSIS — G9341 Metabolic encephalopathy: Secondary | ICD-10-CM | POA: Diagnosis present

## 2023-01-11 DIAGNOSIS — Z888 Allergy status to other drugs, medicaments and biological substances status: Secondary | ICD-10-CM

## 2023-01-11 DIAGNOSIS — I10 Essential (primary) hypertension: Secondary | ICD-10-CM | POA: Diagnosis present

## 2023-01-11 DIAGNOSIS — F1120 Opioid dependence, uncomplicated: Secondary | ICD-10-CM | POA: Diagnosis present

## 2023-01-11 DIAGNOSIS — Z79899 Other long term (current) drug therapy: Secondary | ICD-10-CM

## 2023-01-11 DIAGNOSIS — D6869 Other thrombophilia: Secondary | ICD-10-CM | POA: Diagnosis present

## 2023-01-11 DIAGNOSIS — Z96653 Presence of artificial knee joint, bilateral: Secondary | ICD-10-CM | POA: Diagnosis present

## 2023-01-11 DIAGNOSIS — C159 Malignant neoplasm of esophagus, unspecified: Secondary | ICD-10-CM | POA: Diagnosis present

## 2023-01-11 DIAGNOSIS — Z9181 History of falling: Secondary | ICD-10-CM

## 2023-01-11 DIAGNOSIS — J189 Pneumonia, unspecified organism: Secondary | ICD-10-CM | POA: Diagnosis not present

## 2023-01-11 DIAGNOSIS — G8929 Other chronic pain: Secondary | ICD-10-CM | POA: Diagnosis present

## 2023-01-11 DIAGNOSIS — Z8249 Family history of ischemic heart disease and other diseases of the circulatory system: Secondary | ICD-10-CM

## 2023-01-11 DIAGNOSIS — S41111A Laceration without foreign body of right upper arm, initial encounter: Secondary | ICD-10-CM | POA: Diagnosis present

## 2023-01-11 DIAGNOSIS — Z96643 Presence of artificial hip joint, bilateral: Secondary | ICD-10-CM | POA: Diagnosis present

## 2023-01-11 DIAGNOSIS — E440 Moderate protein-calorie malnutrition: Secondary | ICD-10-CM

## 2023-01-11 LAB — URINALYSIS, ROUTINE W REFLEX MICROSCOPIC
Bilirubin Urine: NEGATIVE
Glucose, UA: NEGATIVE mg/dL
Hgb urine dipstick: NEGATIVE
Ketones, ur: NEGATIVE mg/dL
Leukocytes,Ua: NEGATIVE
Nitrite: NEGATIVE
Protein, ur: NEGATIVE mg/dL
Specific Gravity, Urine: 1.01 (ref 1.005–1.030)
pH: 6 (ref 5.0–8.0)

## 2023-01-11 LAB — CEA: CEA: 77.2 ng/mL — ABNORMAL HIGH (ref 0.0–4.7)

## 2023-01-11 MED ORDER — SODIUM CHLORIDE 0.9 % IV BOLUS
1000.0000 mL | Freq: Once | INTRAVENOUS | Status: AC
  Administered 2023-01-12: 1000 mL via INTRAVENOUS

## 2023-01-11 NOTE — ED Provider Notes (Signed)
[facility] Provider Note    Event Date/Time   First MD Initiated Contact with Patient 01/11/23 2205     (approximate)   History   Altered Mental Status   HPI  Aaron Short is a 77 y.o. male  with esophageal adenoCA currently on chemo here with AMS. History provided by wife. Per report, pt was in his usual state of health this AM. He is currently receiving chemo at home in addition to tx at the infusion center. She left to go see family and he called back saying he had fallen. When she arrived he was weak, with skin tears to R arm, but was not confused. He then fell again shortly thereafter with tears to his left arm. Over the rest of the afternoon, he has become increasingly confused, weak. He has been markedly more confused than usual, tot he point of not recognizing his wife which has never happened. He has been urinating much more frequently than usual.        Physical Exam   Triage Vital Signs: ED Triage Vitals  Encounter Vitals Group     BP 01/11/23 2215 120/67     Systolic BP Percentile --      Diastolic BP Percentile --      Pulse Rate 01/11/23 2215 73     Resp 01/11/23 2215 17     Temp --      Temp src --      SpO2 01/11/23 2215 98 %     Weight 01/11/23 2205 136 lb 1.6 oz (61.7 kg)     Height --      Head Circumference --      Peak Flow --      Pain Score 01/11/23 2218 0     Pain Loc --      Pain Education --      Exclude from Growth Chart --     Most recent vital signs: Vitals:   01/12/23 0030 01/12/23 0100  BP: 127/64 (!) 125/51  Pulse: 78 (!) 114  Resp: 13 16  SpO2: 100% (!) 88%     General: Drowsy, elderly, chronically ill-appearing. CV:  Good peripheral perfusion. RRR. Resp:  Normal work of breathing. Mild basilar rales. Abd:  No distention. No tenderness. Other:  Disoriented, intermittently appears confused and restless but is able to be consoled. MAE. No overt CN deficits.   ED Results / Procedures /  Treatments   Labs (all labs ordered are listed, but only abnormal results are displayed) Labs Reviewed  CBC WITH DIFFERENTIAL/PLATELET - Abnormal; Notable for the following components:      Result Value   RBC 3.05 (*)    Hemoglobin 10.3 (*)    HCT 30.9 (*)    MCV 101.3 (*)    RDW 17.5 (*)    All other components within normal limits  COMPREHENSIVE METABOLIC PANEL - Abnormal; Notable for the following components:   Glucose, Bld 114 (*)    Calcium 8.6 (*)    Total Protein 6.1 (*)    Albumin 2.7 (*)    AST 75 (*)    All other components within normal limits  BLOOD GAS, VENOUS - Abnormal; Notable for the following components:   pO2, Ven <31 (*)    Bicarbonate 34.1 (*)    Acid-Base Excess 6.9 (*)    All other components within normal limits  LACTIC ACID, PLASMA - Abnormal; Notable for the following components:   Lactic Acid, Venous 2.1 (*)  All other components within normal limits  URINALYSIS, ROUTINE W REFLEX MICROSCOPIC - Abnormal; Notable for the following components:   Color, Urine YELLOW (*)    APPearance HAZY (*)    All other components within normal limits  LACTIC ACID, PLASMA     EKG    RADIOLOGY CXR: Possible patchy opacities bilateral bases CT head/C Spine: Pending   I also independently reviewed and agree with radiologist interpretations.   PROCEDURES:  Critical Care performed: No   MEDICATIONS ORDERED IN ED: Medications  vancomycin (VANCOREADY) IVPB 1500 mg/300 mL (1,500 mg Intravenous New Bag/Given 01/12/23 0127)  sodium chloride 0.9 % bolus 1,000 mL (0 mLs Intravenous Stopped 01/12/23 0100)  ceFEPIme (MAXIPIME) 2 g in sodium chloride 0.9 % 100 mL IVPB (0 g Intravenous Stopped 01/12/23 0100)     IMPRESSION / MDM / ASSESSMENT AND PLAN / ED COURSE  I reviewed the triage vital signs and the nursing notes.                              Differential diagnosis includes, but is not limited to, acute encephalopathy, possibly infectious (UTI, COVID,  PNA), metabolic, toxic, polypharmacy related, CVA  Patient's presentation is most consistent with acute presentation with potential threat to life or bodily function.  The patient is on the cardiac monitor to evaluate for evidence of arrhythmia and/or significant heart rate changes  77 yo M with h/o esophageal CA on chemo,  AFib, chronic pain, here with acute confusion/encephalopathy. No focal deficits but pt has had significant decline in mental status throughout the day. CT head/C [place] pending, as well as broad initial screening labs. Initial CXR read as possible PNA, and he does have a mild cough with rales on exam - will cover empirically for PNA given his chemo status, and plan to f/u labs with admission.   FINAL CLINICAL IMPRESSION(S) / ED DIAGNOSES   Final diagnoses:  Acute encephalopathy     Rx / DC Orders   ED Discharge Orders     None        Note:  This document was prepared using Dragon voice recognition software and may include unintentional dictation errors.   Shaune Pollack, MD 01/12/23 0201

## 2023-01-11 NOTE — ED Triage Notes (Signed)
PT BIB EMS from home for AMS. EMS reports wife states AMS since 2000 tonight. Reportedly 2 unwitness falls today. Pt is a chemo pt, arrives with chest port access and chemo medication infusing via pump. Pt takes oxycodone and morphine at home for pain control.  EMS started IV and admin of NS.  CBG 144

## 2023-01-11 NOTE — ED Notes (Signed)
ED Provider at bedside. 

## 2023-01-12 ENCOUNTER — Inpatient Hospital Stay: Payer: Medicare Other

## 2023-01-12 ENCOUNTER — Other Ambulatory Visit: Payer: Self-pay

## 2023-01-12 ENCOUNTER — Encounter: Payer: Self-pay | Admitting: Internal Medicine

## 2023-01-12 DIAGNOSIS — Z9189 Other specified personal risk factors, not elsewhere classified: Secondary | ICD-10-CM

## 2023-01-12 DIAGNOSIS — Z7901 Long term (current) use of anticoagulants: Secondary | ICD-10-CM | POA: Diagnosis not present

## 2023-01-12 DIAGNOSIS — G9340 Encephalopathy, unspecified: Secondary | ICD-10-CM | POA: Diagnosis not present

## 2023-01-12 DIAGNOSIS — S41112A Laceration without foreign body of left upper arm, initial encounter: Secondary | ICD-10-CM | POA: Diagnosis present

## 2023-01-12 DIAGNOSIS — K521 Toxic gastroenteritis and colitis: Secondary | ICD-10-CM | POA: Diagnosis present

## 2023-01-12 DIAGNOSIS — R262 Difficulty in walking, not elsewhere classified: Secondary | ICD-10-CM | POA: Diagnosis present

## 2023-01-12 DIAGNOSIS — E871 Hypo-osmolality and hyponatremia: Secondary | ICD-10-CM | POA: Diagnosis present

## 2023-01-12 DIAGNOSIS — G894 Chronic pain syndrome: Secondary | ICD-10-CM | POA: Diagnosis present

## 2023-01-12 DIAGNOSIS — Z1152 Encounter for screening for COVID-19: Secondary | ICD-10-CM | POA: Diagnosis not present

## 2023-01-12 DIAGNOSIS — J189 Pneumonia, unspecified organism: Secondary | ICD-10-CM | POA: Diagnosis present

## 2023-01-12 DIAGNOSIS — C679 Malignant neoplasm of bladder, unspecified: Secondary | ICD-10-CM | POA: Diagnosis present

## 2023-01-12 DIAGNOSIS — E440 Moderate protein-calorie malnutrition: Secondary | ICD-10-CM

## 2023-01-12 DIAGNOSIS — M549 Dorsalgia, unspecified: Secondary | ICD-10-CM

## 2023-01-12 DIAGNOSIS — C159 Malignant neoplasm of esophagus, unspecified: Secondary | ICD-10-CM

## 2023-01-12 DIAGNOSIS — Z96653 Presence of artificial knee joint, bilateral: Secondary | ICD-10-CM | POA: Diagnosis present

## 2023-01-12 DIAGNOSIS — Z880 Allergy status to penicillin: Secondary | ICD-10-CM | POA: Diagnosis not present

## 2023-01-12 DIAGNOSIS — D649 Anemia, unspecified: Secondary | ICD-10-CM | POA: Diagnosis present

## 2023-01-12 DIAGNOSIS — I482 Chronic atrial fibrillation, unspecified: Secondary | ICD-10-CM | POA: Diagnosis present

## 2023-01-12 DIAGNOSIS — R4182 Altered mental status, unspecified: Secondary | ICD-10-CM | POA: Diagnosis present

## 2023-01-12 DIAGNOSIS — G9341 Metabolic encephalopathy: Secondary | ICD-10-CM | POA: Diagnosis present

## 2023-01-12 DIAGNOSIS — W19XXXA Unspecified fall, initial encounter: Secondary | ICD-10-CM | POA: Diagnosis present

## 2023-01-12 DIAGNOSIS — I10 Essential (primary) hypertension: Secondary | ICD-10-CM | POA: Diagnosis present

## 2023-01-12 DIAGNOSIS — Z9221 Personal history of antineoplastic chemotherapy: Secondary | ICD-10-CM

## 2023-01-12 DIAGNOSIS — E119 Type 2 diabetes mellitus without complications: Secondary | ICD-10-CM | POA: Diagnosis present

## 2023-01-12 DIAGNOSIS — R296 Repeated falls: Secondary | ICD-10-CM

## 2023-01-12 DIAGNOSIS — G8929 Other chronic pain: Secondary | ICD-10-CM

## 2023-01-12 DIAGNOSIS — D6869 Other thrombophilia: Secondary | ICD-10-CM | POA: Diagnosis present

## 2023-01-12 DIAGNOSIS — F17210 Nicotine dependence, cigarettes, uncomplicated: Secondary | ICD-10-CM | POA: Diagnosis present

## 2023-01-12 DIAGNOSIS — T451X5A Adverse effect of antineoplastic and immunosuppressive drugs, initial encounter: Secondary | ICD-10-CM | POA: Diagnosis present

## 2023-01-12 DIAGNOSIS — E872 Acidosis, unspecified: Secondary | ICD-10-CM | POA: Diagnosis present

## 2023-01-12 DIAGNOSIS — S41111A Laceration without foreign body of right upper arm, initial encounter: Secondary | ICD-10-CM | POA: Diagnosis present

## 2023-01-12 DIAGNOSIS — F1120 Opioid dependence, uncomplicated: Secondary | ICD-10-CM | POA: Diagnosis present

## 2023-01-12 DIAGNOSIS — Z8249 Family history of ischemic heart disease and other diseases of the circulatory system: Secondary | ICD-10-CM | POA: Diagnosis not present

## 2023-01-12 LAB — COMPREHENSIVE METABOLIC PANEL
ALT: 36 U/L (ref 0–44)
AST: 75 U/L — ABNORMAL HIGH (ref 15–41)
Albumin: 2.7 g/dL — ABNORMAL LOW (ref 3.5–5.0)
Alkaline Phosphatase: 92 U/L (ref 38–126)
Anion gap: 8 (ref 5–15)
BUN: 18 mg/dL (ref 8–23)
CO2: 28 mmol/L (ref 22–32)
Calcium: 8.6 mg/dL — ABNORMAL LOW (ref 8.9–10.3)
Chloride: 100 mmol/L (ref 98–111)
Creatinine, Ser: 0.69 mg/dL (ref 0.61–1.24)
GFR, Estimated: >60 mL/min (ref >60)
Glucose, Bld: 114 mg/dL — ABNORMAL HIGH (ref 70–99)
Potassium: 4.3 mmol/L (ref 3.5–5.1)
Sodium: 136 mmol/L (ref 135–145)
Total Bilirubin: 0.7 mg/dL (ref 0.3–1.2)
Total Protein: 6.1 g/dL — ABNORMAL LOW (ref 6.5–8.1)

## 2023-01-12 LAB — BASIC METABOLIC PANEL
Anion gap: 10 (ref 5–15)
BUN: 16 mg/dL (ref 8–23)
CO2: 25 mmol/L (ref 22–32)
Calcium: 8.6 mg/dL — ABNORMAL LOW (ref 8.9–10.3)
Chloride: 100 mmol/L (ref 98–111)
Creatinine, Ser: 0.69 mg/dL (ref 0.61–1.24)
GFR, Estimated: >60 mL/min (ref >60)
Glucose, Bld: 93 mg/dL (ref 70–99)
Potassium: 4.1 mmol/L (ref 3.5–5.1)
Sodium: 135 mmol/L (ref 135–145)

## 2023-01-12 LAB — CBC WITH DIFFERENTIAL/PLATELET
Abs Immature Granulocytes: 0.01 10*3/uL (ref 0.00–0.07)
Basophils Absolute: 0 10*3/uL (ref 0.0–0.1)
Basophils Relative: 1 %
Eosinophils Absolute: 0 10*3/uL (ref 0.0–0.5)
Eosinophils Relative: 1 %
HCT: 30.9 % — ABNORMAL LOW (ref 39.0–52.0)
Hemoglobin: 10.3 g/dL — ABNORMAL LOW (ref 13.0–17.0)
Immature Granulocytes: 0 %
Lymphocytes Relative: 11 %
Lymphs Abs: 0.7 10*3/uL (ref 0.7–4.0)
MCH: 33.8 pg (ref 26.0–34.0)
MCHC: 33.3 g/dL (ref 30.0–36.0)
MCV: 101.3 fL — ABNORMAL HIGH (ref 80.0–100.0)
Monocytes Absolute: 0.7 10*3/uL (ref 0.1–1.0)
Monocytes Relative: 12 %
Neutro Abs: 4.8 10*3/uL (ref 1.7–7.7)
Neutrophils Relative %: 75 %
Platelets: 211 10*3/uL (ref 150–400)
RBC: 3.05 MIL/uL — ABNORMAL LOW (ref 4.22–5.81)
RDW: 17.5 % — ABNORMAL HIGH (ref 11.5–15.5)
WBC: 6.3 10*3/uL (ref 4.0–10.5)
nRBC: 0 % (ref 0.0–0.2)

## 2023-01-12 LAB — BLOOD GAS, VENOUS
Acid-Base Excess: 6.9 mmol/L — ABNORMAL HIGH (ref 0.0–2.0)
Bicarbonate: 34.1 mmol/L — ABNORMAL HIGH (ref 20.0–28.0)
O2 Saturation: 44.6 %
Patient temperature: 37
pCO2, Ven: 59 mmHg (ref 44–60)
pH, Ven: 7.37 (ref 7.25–7.43)
pO2, Ven: <31 mmHg — CL (ref 32–45)

## 2023-01-12 LAB — CBC
HCT: 31.9 % — ABNORMAL LOW (ref 39.0–52.0)
Hemoglobin: 10.6 g/dL — ABNORMAL LOW (ref 13.0–17.0)
MCH: 33.2 pg (ref 26.0–34.0)
MCHC: 33.2 g/dL (ref 30.0–36.0)
MCV: 100 fL (ref 80.0–100.0)
Platelets: 234 10*3/uL (ref 150–400)
RBC: 3.19 MIL/uL — ABNORMAL LOW (ref 4.22–5.81)
RDW: 17.7 % — ABNORMAL HIGH (ref 11.5–15.5)
WBC: 6.1 10*3/uL (ref 4.0–10.5)
nRBC: 0 % (ref 0.0–0.2)

## 2023-01-12 LAB — MRSA NEXT GEN BY PCR, NASAL: MRSA by PCR Next Gen: NOT DETECTED

## 2023-01-12 LAB — LACTIC ACID, PLASMA
Lactic Acid, Venous: 2.1 mmol/L (ref 0.5–1.9)
Lactic Acid, Venous: 2.5 mmol/L (ref 0.5–1.9)
Lactic Acid, Venous: 2.6 mmol/L (ref 0.5–1.9)

## 2023-01-12 LAB — SARS CORONAVIRUS 2 BY RT PCR: SARS Coronavirus 2 by RT PCR: NEGATIVE

## 2023-01-12 LAB — STREP PNEUMONIAE URINARY ANTIGEN: Strep Pneumo Urinary Antigen: NEGATIVE

## 2023-01-12 MED ORDER — MAGIC MOUTHWASH W/LIDOCAINE: 5.0000 mL | Freq: Four times a day (QID) | ORAL | Status: DC | PRN

## 2023-01-12 MED ORDER — ALUM & MAG HYDROXIDE-SIMETH 200-200-20 MG/5ML PO SUSP
30.0000 mL | Freq: Once | ORAL | Status: AC
  Administered 2023-01-12: 30 mL via ORAL
  Filled 2023-01-12: qty 30

## 2023-01-12 MED ORDER — SERTRALINE HCL 50 MG PO TABS
100.0000 mg | ORAL_TABLET | Freq: Every day | ORAL | Status: DC
  Administered 2023-01-12 – 2023-01-13 (×2): 100 mg via ORAL
  Filled 2023-01-12 (×2): qty 2

## 2023-01-12 MED ORDER — VANCOMYCIN HCL 1250 MG/250ML IV SOLN: 1250.0000 mg | INTRAVENOUS | Status: DC

## 2023-01-12 MED ORDER — ONDANSETRON HCL 4 MG/2ML IJ SOLN
4.0000 mg | Freq: Four times a day (QID) | INTRAMUSCULAR | Status: DC | PRN
  Administered 2023-01-13 – 2023-01-14 (×2): 4 mg via INTRAVENOUS
  Filled 2023-01-12 (×2): qty 2

## 2023-01-12 MED ORDER — ACETAMINOPHEN 650 MG RE SUPP: 650.0000 mg | Freq: Four times a day (QID) | RECTAL | Status: DC | PRN

## 2023-01-12 MED ORDER — ACETAMINOPHEN 325 MG PO TABS: 650.0000 mg | ORAL_TABLET | Freq: Four times a day (QID) | ORAL | Status: DC | PRN

## 2023-01-12 MED ORDER — ENSURE ENLIVE PO LIQD
237.0000 mL | Freq: Three times a day (TID) | ORAL | Status: DC
  Administered 2023-01-12 – 2023-01-13 (×2): 237 mL via ORAL

## 2023-01-12 MED ORDER — ENOXAPARIN SODIUM 40 MG/0.4ML IJ SOSY: 40.0000 mg | PREFILLED_SYRINGE | INTRAMUSCULAR | Status: DC

## 2023-01-12 MED ORDER — OXYCODONE HCL 10 MG PO TABS: 10.0000 mg | ORAL_TABLET | ORAL | Status: DC | PRN

## 2023-01-12 MED ORDER — SODIUM CHLORIDE 0.9 % IV SOLN
2.0000 g | Freq: Once | INTRAVENOUS | Status: AC
  Administered 2023-01-12: 2 g via INTRAVENOUS
  Filled 2023-01-12: qty 12.5

## 2023-01-12 MED ORDER — ENSURE ENLIVE PO LIQD: 237.0000 mL | Freq: Two times a day (BID) | ORAL | Status: DC

## 2023-01-12 MED ORDER — SODIUM CHLORIDE 0.9 % IV SOLN
2.0000 g | Freq: Three times a day (TID) | INTRAVENOUS | Status: DC
  Administered 2023-01-12 – 2023-01-14 (×7): 2 g via INTRAVENOUS
  Filled 2023-01-12 (×7): qty 12.5

## 2023-01-12 MED ORDER — CHLORHEXIDINE GLUCONATE CLOTH 2 % EX PADS
6.0000 | MEDICATED_PAD | Freq: Every day | CUTANEOUS | Status: DC
  Administered 2023-01-12 – 2023-01-14 (×3): 6 via TOPICAL

## 2023-01-12 MED ORDER — ONDANSETRON HCL 4 MG PO TABS: 4.0000 mg | ORAL_TABLET | Freq: Four times a day (QID) | ORAL | Status: DC | PRN

## 2023-01-12 MED ORDER — OXYCODONE HCL 5 MG PO TABS: 10.0000 mg | ORAL_TABLET | ORAL | Status: DC | PRN

## 2023-01-12 MED ORDER — LACTATED RINGERS IV SOLN: INTRAVENOUS | Status: DC

## 2023-01-12 MED ORDER — ADULT MULTIVITAMIN W/MINERALS CH
1.0000 | ORAL_TABLET | Freq: Every day | ORAL | Status: DC
  Administered 2023-01-12 – 2023-01-14 (×3): 1 via ORAL
  Filled 2023-01-12 (×3): qty 1

## 2023-01-12 MED ORDER — VANCOMYCIN HCL 1500 MG/300ML IV SOLN
1500.0000 mg | Freq: Once | INTRAVENOUS | Status: AC
  Administered 2023-01-12: 1500 mg via INTRAVENOUS
  Filled 2023-01-12: qty 300

## 2023-01-12 MED ORDER — RIVAROXABAN 20 MG PO TABS
20.0000 mg | ORAL_TABLET | Freq: Every day | ORAL | Status: DC
  Administered 2023-01-12 – 2023-01-13 (×2): 20 mg via ORAL
  Filled 2023-01-12 (×2): qty 1

## 2023-01-12 MED ORDER — OXYCODONE HCL 5 MG PO TABS
20.0000 mg | ORAL_TABLET | ORAL | Status: DC | PRN
  Administered 2023-01-12 – 2023-01-14 (×5): 20 mg via ORAL
  Filled 2023-01-12 (×5): qty 4

## 2023-01-12 MED ORDER — ALBUTEROL SULFATE (2.5 MG/3ML) 0.083% IN NEBU: 2.5000 mg | INHALATION_SOLUTION | RESPIRATORY_TRACT | Status: DC | PRN

## 2023-01-12 NOTE — Evaluation (Signed)
Physical Therapy Evaluation Patient Details Name: Aaron Short MRN: [id number] DOB: 1946/01/16 Today's Date: 01/12/2023  History of Present Illness  Pt is a 77 y/o presenting following multiple falls at home and admitted for pneumonia. Per pt, he was walking in his kitchen but doesn't remember how he fell and doesn't recall a second fall. PMH includes Esophageal adenocarcinoma (HCC) on chemo,  Bladder cancer on surveillance cystoscopy, chronic pain, afib, DM, and HTN.  Clinical Impression   Pt presents laying in bed, 3/10 pain at rest. He currently lives with his wife in a 1 story home with 4 steps to enter and B/L rails. PTA he was using a cane/RW for ambulation and his wife would assist with bathing/dressing.   PT screen revealed sensation WNL and generalized LE weakness (primarily b/l hip flexion MMT 3/5). Pt able to perform supine> sit with supervision, noting increased effort to achieve sitting. Pt able to perform sit<>stand and ambulate ~173ft with CGA and RW. Pt noted to have intermittent unsteadiness with ambulation and verbalized that he was very fatigued at the end of session. Pt would benefit from continued skilled care to maximize functional abilities.       If plan is discharge home, recommend the following: A little help with walking and/or transfers;A little help with bathing/dressing/bathroom;Assistance with cooking/housework;Direct supervision/assist for medications management;Help with stairs or ramp for entrance;Assist for transportation   Can travel by private vehicle   No    Equipment Recommendations Other (comment) (TBD)  Recommendations for Other Services       Functional Status Assessment Patient has had a recent decline in their functional status and demonstrates the ability to make significant improvements in function in a reasonable and predictable amount of time.     Precautions / Restrictions Precautions Precautions: Fall Restrictions Weight Bearing  Restrictions: No      Mobility  Bed Mobility Overal bed mobility: Needs Assistance Bed Mobility: Supine to Sit     Supine to sit: Supervision     General bed mobility comments: increased effort to achieve sitting, able to scoot without assistance    Transfers Overall transfer level: Needs assistance Equipment used: Rolling walker (2 wheels) Transfers: Sit to/from Stand Sit to Stand: Contact guard assist                Ambulation/Gait   Gait Distance (Feet): 160 Feet Assistive device: Rolling walker (2 wheels) Gait Pattern/deviations: Trunk flexed, Shuffle, Decreased step length - right, Decreased step length - left Gait velocity: decreased     General Gait Details: pt able to intermittently correct forward flexed posture, some unsteadiness noted with ambulation  Stairs            Wheelchair Mobility     Tilt Bed    Modified Rankin (Stroke Patients Only)       Balance Overall balance assessment: Needs assistance   Sitting balance-Leahy Scale: Good Sitting balance - Comments: able to don socks in sitting position     Standing balance-Leahy Scale: Fair                               Pertinent Vitals/Pain Pain Assessment Pain Assessment: 0-10 Pain Score: 3  Pain Location: everywhere Pain Descriptors / Indicators: Discomfort Pain Intervention(s): Monitored during session    Home Living Family/patient expects to be discharged to:: Private residence Living Arrangements: Spouse/significant other Available Help at Discharge: Family Type of Home: House Home Access: Stairs to enter  Entrance Stairs-Rails: Right;Left Entrance Stairs-Number of Steps: 4 (from garage)   Home Layout: One level Home Equipment: Agricultural consultant (2 wheels);Cane - single point      Prior Function Prior Level of Function : Independent/Modified Independent             Mobility Comments: Pt notes 3 falls in past 23mo, uses RW/cane for ambulation ADLs  Comments: Wife assists with ADLs     Extremity/Trunk Assessment   Upper Extremity Assessment Upper Extremity Assessment: Overall WFL for tasks assessed    Lower Extremity Assessment Lower Extremity Assessment: RLE deficits/detail;LLE deficits/detail;Generalized weakness RLE Deficits / Details: Hip flex 3/5, all other 4/5 RLE Sensation: WNL LLE Deficits / Details: Hip flex 3/5, all other 4/5 LLE Sensation: WNL       Communication   Communication Communication: No apparent difficulties  Cognition Arousal: Alert Behavior During Therapy: WFL for tasks assessed/performed Overall Cognitive Status: Impaired/Different from baseline Area of Impairment: Orientation                 Orientation Level: Disoriented to, Situation                      General Comments      Exercises     Assessment/Plan    PT Assessment Patient needs continued PT services  PT Problem List Decreased strength;Decreased range of motion;Decreased activity tolerance;Decreased balance;Decreased mobility;Decreased cognition;Decreased knowledge of use of DME;Decreased safety awareness;Decreased knowledge of precautions       PT Treatment Interventions DME instruction;Gait training;Stair training;Functional mobility training;Therapeutic activities;Therapeutic exercise;Balance training;Neuromuscular re-education;Patient/family education    PT Goals (Current goals can be found in the Care Plan section)  Acute Rehab PT Goals Patient Stated Goal: return home PT Goal Formulation: With patient Time For Goal Achievement: 01/26/23 Potential to Achieve Goals: Good    Frequency Min 1X/week     Co-evaluation               AM-PAC PT "6 Clicks" Mobility  Outcome Measure Help needed turning from your back to your side while in a flat bed without using bedrails?: A Little Help needed moving from lying on your back to sitting on the side of a flat bed without using bedrails?: A Little Help  needed moving to and from a bed to a chair (including a wheelchair)?: A Little Help needed standing up from a chair using your arms (e.g., wheelchair or bedside chair)?: A Little Help needed to walk in hospital room?: A Little Help needed climbing 3-5 steps with a railing? : A Lot 6 Click Score: 17    End of Session Equipment Utilized During Treatment: Gait belt Activity Tolerance: Patient limited by fatigue Patient left: in chair;with call bell/phone within reach;with chair alarm set;with nursing/sitter in room   PT Visit Diagnosis: Other abnormalities of gait and mobility (R26.89);Repeated falls (R29.6);History of falling (Z91.81);Muscle weakness (generalized) (M62.81);Difficulty in walking, not elsewhere classified (R26.2)    Time: 6295-2841 PT Time Calculation (min) (ACUTE ONLY): 34 min   Charges:   PT Evaluation $PT Eval Low Complexity: 1 Low PT Treatments $Therapeutic Activity: 23-37 mins PT General Charges $$ ACUTE PT VISIT: 1 Visit         Shun Pletz, PT, SPT 3:24 PM,01/12/23

## 2023-01-12 NOTE — Progress Notes (Signed)
PHARMACY -  BRIEF ANTIBIOTIC NOTE   Pharmacy has received consult(s) for Cefepime, Vancomycin from an ED provider.  The patient's profile has been reviewed for ht/wt/allergies/indication/available labs.    One time order(s) placed for Cefepime 2 gm IV X 1, Vancomycin 1500 mg IV X 1.   Further antibiotics/pharmacy consults should be ordered by admitting physician if indicated.                       Thank you, Maui Ahart D 01/12/2023  12:10 AM

## 2023-01-12 NOTE — Assessment & Plan Note (Signed)
Continue Xarelto 

## 2023-01-12 NOTE — Assessment & Plan Note (Signed)
Possibly related to medication Hospitalized a month ago with rhabdo from a fall PT eval

## 2023-01-12 NOTE — Assessment & Plan Note (Deleted)
Judicious narcotic use Currently on oxycodone 10-20 every 4 as needed and Soma 350 daily

## 2023-01-12 NOTE — H&P (Signed)
History and Physical    Patient: Aaron Short ZOX:[id number] DOB: [birth year] DOA: 01/11/2023 DOS: the patient was seen and examined on 01/12/2023 PCP: Rosemarie Ax, MD  Patient coming from: Home  Chief Complaint:  Chief Complaint  Patient presents with   Altered Mental Status    HPI: Aaron Short is a 77 y.o. male with medical history significant for Atrial fibrillation on Xarelto, esophageal adenocarcinoma on chemo as well as bladder cancer, chemotherapy-induced diarrhea, chronic pain including cancer related pain on chronic opiates, admitted a month ago from 7/7-12/05/22 with a fall resulting in traumatic rhabdomyolysis, who now presents to the ED for evaluation of progressive confusion following 2 unwitnessed falls earlier in the day patient was with apparently in his usual state of health in the morning on 8/15 and then called family to say he had fallen.  Upon their arrival, he was noted to be weak but not confused he fell again later in the day and since the second fall he has become increasingly confused to where he was not recognizing his wife.  They note that he has been urinating more often than usual. ED course and data review: Vitals within normal limits on arrival.  WBC normal with lactic acid 2.1.Hemoglobin 10.3 which is baseline.  VBG unremarkable.  Urinalysis unremarkable.  CMP with minor AST elevation.  COVID swab pending. CT head and C-spine without acute abnormality Chest x-ray suggesting pneumonia as follows IMPRESSION: Chronic lung disease with possible patchy superimposed opacities which may reflect minimal pneumonia, to include atypical organism.  Patient started on cefepime and vancomycin and given a fluid bolus Hospitalist consulted for admission.     Past Medical History:  Diagnosis Date   Arthralgia of lower leg 04/02/2012   Arthralgia of upper arm 06/18/2009   Cancer (HCC)    Diabetes mellitus without complication (HCC)    FH: atrial  fibrillation    Hypertension    Past Surgical History:  Procedure Laterality Date   bladder cancer surgery  2015   ESOPHAGOGASTRODUODENOSCOPY (EGD) WITH PROPOFOL N/A 10/30/2022   Procedure: ESOPHAGOGASTRODUODENOSCOPY (EGD) WITH PROPOFOL;  Surgeon: Toney Reil, MD;  Location: [facility] ENDOSCOPY;  Service: Gastroenterology;  Laterality: N/A;   IR IMAGING GUIDED PORT INSERTION  11/13/2022   JOINT REPLACEMENT     REPLACEMENT TOTAL HIP W/  RESURFACING IMPLANTS Bilateral    SHOULDER SURGERY Bilateral    TOTAL KNEE ARTHROPLASTY Bilateral    Social History:  reports that he has been smoking cigarettes. He has never used smokeless tobacco. He reports that he does not drink alcohol and does not use drugs.  Allergies  Allergen Reactions   Diltiazem Swelling    Leg edema Leg edema Leg edema Leg edema Leg edema Leg edema Leg edema Leg edema Leg edema   Penicillins Hives and Rash    Had hives as child Other reaction(s): RASH Childhood allergy Other reaction(s): RASH Other reaction(s): RASH Childhood allergy Other reaction(s): RASH Other reaction(s): RASH Childhood allergy   Tizanidine Other (See Comments)    Other reaction(s): OTHER  Pt states it puts him out Other reaction(s): OTHER  Pt states it puts him out Other reaction(s): OTHER  Pt states it puts him out Other reaction(s): OTHER  Pt states it puts him out Other reaction(s): OTHER  Pt states it puts him out Other reaction(s): OTHER  Pt states it puts him out    Family History  Problem Relation Age of Onset   Dementia Mother    Heart disease Father  Prior to Admission medications   Medication Sig Start Date End Date Taking? Authorizing Provider  B Complex-C (VITAMIN B + C COMPLEX) TABS Take 1 capsule by mouth every morning.    [provider]  carisoprodol (SOMA) 350 MG tablet Take 350 mg by mouth 2 (two) times daily.     [provider]  Cetirizine HCl 10 MG CAPS Take 1 capsule by mouth every  morning.    [provider]  dexamethasone (DECADRON) 4 MG tablet Take 2 tablets (8 mg total) by mouth daily. Start the day after chemotherapy for 2 days. Take with food. 11/03/22   Rickard Patience, MD  lidocaine-prilocaine (EMLA) cream Apply to affected area once 11/03/22   Rickard Patience, MD  loperamide (IMODIUM) 2 MG capsule Take 1 capsule (2 mg total) by mouth See admin instructions. Initial: 4 mg,the 2 mg every 2 hours (4 mg every 4 hours at night)  maximum: 16 mg/day 11/22/22   Rickard Patience, MD  LORazepam (ATIVAN) 0.5 MG tablet Take 1 tablet (0.5 mg total) by mouth every 8 (eight) hours as needed (nausea). 01/10/23   Rickard Patience, MD  magic mouthwash w/lidocaine SOLN Take 5 mLs by mouth 4 (four) times daily as needed for mouth pain. Sig: Swish/Swallow 5-10 ml four times a day as needed. Dispense 480 ml. 1RF 01/10/23   Rickard Patience, MD  Magnesium 500 MG TABS Take 500 mg by mouth as needed.    [provider]  megestrol (MEGACE) 20 MG tablet Take 1 tablet (20 mg total) by mouth 2 (two) times daily. 12/27/22   Rickard Patience, MD  melatonin (MELATONIN MAXIMUM STRENGTH) 5 MG TABS Take 5 mg by mouth at bedtime as needed.    [provider]  naloxone [facility]) nasal spray 4 mg/0.1 mL Place 1 spray into the nose once for 1 dose. Spray half of bottle content into each nostril, then call 911 Patient not taking: Reported on 12/27/2022 11/15/22 12/13/22  Borders, Daryl Eastern, NP  omeprazole (PRILOSEC) 10 MG capsule Take 20 mg by mouth daily.    [provider]  ondansetron (ZOFRAN) 8 MG tablet Take 1 tablet (8 mg total) by mouth every 8 (eight) hours as needed for nausea or vomiting. Start on the third day after chemotherapy. 12/27/22   Rickard Patience, MD  Oxycodone HCl 10 MG TABS Take 1-2 tablets (10-20 mg total) by mouth every 4 (four) hours as needed (pain). 01/01/23   Borders, Daryl Eastern, NP  prazosin (MINIPRESS) 1 MG capsule Take 2 mg by mouth at bedtime. 10/01/20   [provider]  prochlorperazine (COMPAZINE) 10  MG tablet Take 1 tablet (10 mg total) by mouth every 6 (six) hours as needed for nausea or vomiting. 12/27/22   Rickard Patience, MD  rivaroxaban (XARELTO) 20 MG TABS tablet Take 20 mg by mouth daily. 03/26/17   [provider]  sertraline (ZOLOFT) 50 MG tablet Take 100 mg by mouth at bedtime. 10/05/22 01/10/23  [provider]    Physical Exam: Vitals:   01/12/23 0025 01/12/23 0030 01/12/23 0100 01/12/23 0207  BP: (!) 107/55 127/64 (!) 125/51   Pulse: 65 78 (!) 114   Resp: 15 13 16    Temp:    98.4 F (36.9 C)  TempSrc:    Axillary  SpO2: 99% 100% 94%   Weight:       Physical Exam Vitals and nursing note reviewed.  Constitutional:      General: He is not in acute distress.  Comments: Arn Medal male, looks dehydrated with dry mucous membranes.  Somnolent but arousable  HENT:     Head: Normocephalic and atraumatic.  Cardiovascular:     Rate and Rhythm: Regular rhythm. Tachycardia present.     Heart sounds: Normal heart sounds.  Pulmonary:     Effort: Pulmonary effort is normal.     Breath sounds: Normal breath sounds.  Abdominal:     Palpations: Abdomen is soft.     Tenderness: There is no abdominal tenderness.  Neurological:     General: No focal deficit present.     Comments: Somnolent, lethargic     Labs on Admission: I have personally reviewed following labs and imaging studies  CBC: Recent Labs  Lab 01/10/23 0814 01/11/23 2335  WBC 7.2 6.3  NEUTROABS 3.8 4.8  HGB 10.7* 10.3*  HCT 31.5* 30.9*  MCV 98.7 101.3*  PLT 206 211   Basic Metabolic Panel: Recent Labs  Lab 01/10/23 0814 01/11/23 2335  NA 134* 136  K 3.7 4.3  CL 99 100  CO2 27 28  GLUCOSE 143* 114*  BUN 15 18  CREATININE 0.67 0.69  CALCIUM 8.9 8.6*   GFR: Estimated Creatinine Clearance: 65.3 mL/min (by C-G formula based on SCr of 0.69 mg/dL). Liver Function Tests: Recent Labs  Lab 01/10/23 0814 01/11/23 2335  AST 34 75*  ALT 20 36  ALKPHOS 113 92  BILITOT 0.4 0.7   PROT 6.4* 6.1*  ALBUMIN 3.1* 2.7*   No results for input(s): "LIPASE", "AMYLASE" in the last 168 hours. No results for input(s): "AMMONIA" in the last 168 hours. Coagulation Profile: No results for input(s): "INR", "PROTIME" in the last 168 hours. Cardiac Enzymes: No results for input(s): "CKTOTAL", "CKMB", "CKMBINDEX", "TROPONINI" in the last 168 hours. BNP (last 3 results) No results for input(s): "PROBNP" in the last 8760 hours. HbA1C: No results for input(s): "HGBA1C" in the last 72 hours. CBG: No results for input(s): "GLUCAP" in the last 168 hours. Lipid Profile: No results for input(s): "CHOL", "HDL", "LDLCALC", "TRIG", "CHOLHDL", "LDLDIRECT" in the last 72 hours. Thyroid Function Tests: No results for input(s): "TSH", "T4TOTAL", "FREET4", "T3FREE", "THYROIDAB" in the last 72 hours. Anemia Panel: No results for input(s): "VITAMINB12", "FOLATE", "FERRITIN", "TIBC", "IRON", "RETICCTPCT" in the last 72 hours. Urine analysis:    Component Value Date/Time   COLORURINE YELLOW (A) 01/11/2023 2335   APPEARANCEUR HAZY (A) 01/11/2023 2335   APPEARANCEUR CLOUDY 09/14/2013 1214   LABSPEC 1.010 01/11/2023 2335   LABSPEC 1.010 09/14/2013 1214   PHURINE 6.0 01/11/2023 2335   GLUCOSEU NEGATIVE 01/11/2023 2335   GLUCOSEU NEGATIVE 09/14/2013 1214   HGBUR NEGATIVE 01/11/2023 2335   BILIRUBINUR NEGATIVE 01/11/2023 2335   BILIRUBINUR NEGATIVE 09/14/2013 1214   KETONESUR NEGATIVE 01/11/2023 2335   PROTEINUR NEGATIVE 01/11/2023 2335   NITRITE NEGATIVE 01/11/2023 2335   LEUKOCYTESUR NEGATIVE 01/11/2023 2335   LEUKOCYTESUR 1+ 09/14/2013 1214    Radiological Exams on Admission: CT HEAD WO CONTRAST ( )  Result Date: 01/11/2023 CLINICAL DATA:  Recent fall with headaches and neck pain, initial encounter EXAM: CT HEAD WITHOUT CONTRAST CT CERVICAL SPINE WITHOUT CONTRAST TECHNIQUE: Multidetector CT imaging of the head and cervical spine was performed following the standard protocol without  intravenous contrast. Multiplanar CT image reconstructions of the cervical spine were also generated. RADIATION DOSE REDUCTION: This exam was performed according to the departmental dose-optimization program which includes automated exposure control, adjustment of the mA and/or kV according to patient size and/or use of iterative reconstruction technique. COMPARISON:  12/04/2022 FINDINGS: CT HEAD FINDINGS Brain: No evidence of acute infarction, hemorrhage, hydrocephalus, extra-axial collection or mass lesion/mass effect. Chronic atrophic and ischemic changes are again noted. Vascular: No hyperdense vessel or unexpected calcification. Skull: Normal. Negative for fracture or focal lesion. Sinuses/Orbits: No acute finding. Other: None. CT CERVICAL SPINE FINDINGS Alignment: Within normal limits. Skull base and vertebrae: 7 cervical segments are well visualized. Multilevel osteophytic change and facet hypertrophic changes seen. No compression deformity is noted. No acute fracture or acute facet abnormality is seen. Soft tissues and spinal canal: Surrounding soft tissue structures are within normal limits. Upper chest: Visualized lung apices demonstrate mild pleuroparenchymal scarring particularly on the left. Other: None IMPRESSION: CT of the head: Chronic atrophic and ischemic changes without acute abnormality. CT of cervical spine: Multilevel degenerative change without acute abnormality. Electronically Signed   By: Alcide Clever M.D.   On: 01/11/2023 23:42   CT Cervical Spine Wo Contrast  Result Date: 01/11/2023 CLINICAL DATA:  Recent fall with headaches and neck pain, initial encounter EXAM: CT HEAD WITHOUT CONTRAST CT CERVICAL SPINE WITHOUT CONTRAST TECHNIQUE: Multidetector CT imaging of the head and cervical spine was performed following the standard protocol without intravenous contrast. Multiplanar CT image reconstructions of the cervical spine were also generated. RADIATION DOSE REDUCTION: This exam was  performed according to the departmental dose-optimization program which includes automated exposure control, adjustment of the mA and/or kV according to patient size and/or use of iterative reconstruction technique. COMPARISON:  12/04/2022 FINDINGS: CT HEAD FINDINGS Brain: No evidence of acute infarction, hemorrhage, hydrocephalus, extra-axial collection or mass lesion/mass effect. Chronic atrophic and ischemic changes are again noted. Vascular: No hyperdense vessel or unexpected calcification. Skull: Normal. Negative for fracture or focal lesion. Sinuses/Orbits: No acute finding. Other: None. CT CERVICAL SPINE FINDINGS Alignment: Within normal limits. Skull base and vertebrae: 7 cervical segments are well visualized. Multilevel osteophytic change and facet hypertrophic changes seen. No compression deformity is noted. No acute fracture or acute facet abnormality is seen. Soft tissues and spinal canal: Surrounding soft tissue structures are within normal limits. Upper chest: Visualized lung apices demonstrate mild pleuroparenchymal scarring particularly on the left. Other: None IMPRESSION: CT of the head: Chronic atrophic and ischemic changes without acute abnormality. CT of cervical spine: Multilevel degenerative change without acute abnormality. Electronically Signed   By: Alcide Clever M.D.   On: 01/11/2023 23:42   DG Chest Portable 1 View  Result Date: 01/11/2023 CLINICAL DATA:  Shortness of breath EXAM: PORTABLE CHEST 1 VIEW COMPARISON:  Chest CT 12/04/2022 FINDINGS: Right-sided central venous port tip at the distal SVC. Evidence of chronic lung disease with coarse interstitial pattern. Possible patchy superimposed opacities. Stable cardiomediastinal silhouette. No pneumothorax. IMPRESSION: Chronic lung disease with possible patchy superimposed opacities which may reflect minimal pneumonia, to include atypical organism. Electronically Signed   By: Jasmine Pang M.D.   On: 01/11/2023 23:30     Data  Reviewed: Relevant notes from primary care and specialist visits, past discharge summaries as available in EHR, including Care Everywhere. Prior diagnostic testing as pertinent to current admission diagnoses Updated medications and problem lists for reconciliation ED course, including vitals, labs, imaging, treatment and response to treatment Triage notes, nursing and pharmacy notes and ED provider's notes Notable results as noted in HPI   Assessment and Plan: Pneumonia At high risk for infection due to chemotherapy Vancomycin and cefepime IV fluids DuoNebs, antitussives, incentive spirometry  Acute metabolic encephalopathy Patient without baseline confusion presenting with falls CT head and C-spine with  no acute injury Neurologic checks Fall precautions  Frequent falls Possibly related to medication Hospitalized a month ago with rhabdo from a fall PT eval  Atrial fibrillation, chronic (HCC) Continue Xarelto   Chronic pain syndrome Opioid dependence, daily use Patient has both cancer and noncancer related pain Continue MS Contin 15 mg every 12 with oxycodone 10 to 20 mg every 4 hours as needed pain Continue Soma   Esophageal adenocarcinoma (HCC) Bladder cancer on surveillance cystoscopy Patient receives chemo   DVT prophylaxis: Xarelto  Consults: none  Advance Care Planning:   Code Status: Prior   Family Communication: Wife at bedside  Disposition Plan: Back to previous home environment  Severity of Illness: The appropriate patient status for this patient is INPATIENT. Inpatient status is judged to be reasonable and necessary in order to provide the required intensity of service to ensure the patient's safety. The patient's presenting symptoms, physical exam findings, and initial radiographic and laboratory data in the context of their chronic comorbidities is felt to place them at high risk for further clinical deterioration. Furthermore, it is not anticipated  that the patient will be medically stable for discharge from the hospital within 2 midnights of admission.   * I certify that at the point of admission it is my clinical judgment that the patient will require inpatient hospital care spanning beyond 2 midnights from the point of admission due to high intensity of service, high risk for further deterioration and high frequency of surveillance required.*  Author: Andris Baumann, MD 01/12/2023 2:28 AM  For on call review [email or web address].

## 2023-01-12 NOTE — Assessment & Plan Note (Signed)
Patient without baseline confusion presenting with falls CT head and C-spine with no acute injury Neurologic checks Fall precautions

## 2023-01-12 NOTE — Progress Notes (Addendum)
Progress Note   Patient: Aaron Short WUJ:[id number] DOB: 1946/01/19 DOA: 01/11/2023     0 DOS: the patient was seen and examined on 01/12/2023   Brief hospital course: 77 year old male admitted with altered mental status following 2 falls.  Workup showing pneumonia.  Head CT negative   Assessment and Plan:  Pneumonia At high risk for infection due to chemotherapy Has elevated LA  Discontinue Vancomycin (given WBC wnl, and MRSA PCR is negative) and DC observe off of it for next 1-2 days  Continue cefepime Continuous IV fluids Continue DuoNebs, antitussives, incentive spirometry Pt had a swallow study in June where he was provided some explicit instructions on swallowing because he was at risk for aspirating --> will obtain Speech swallow eval during this hospitalization   Acute metabolic encephalopathy Patient without baseline confusion presenting with falls CT head and C-spine: unremarkable Neurologic checks Fall precautions   Frequent falls  Possibly related to medication Hospitalized a month ago with rhabdo from a fall PT consulted   Atrial fibrillation, chronic (HCC) Continue Xarelto   Chronic pain syndrome Opioid dependence, daily use Patient has both cancer and noncancer related pain Continue MS Contin 15 mg every 12 with oxycodone 10 to 20 mg every 4 hours as needed pain Continue Soma   Esophageal adenocarcinoma (HCC) Bladder cancer on surveillance cystoscopy Patient receives chemo Consult Oncology for follow up     DVT prophylaxis: Xarelto     Disposition Plan: Back to previous home environment      Subjective: Reported generalized weakness, chills No fever Mild shortness of breath and cough Denies chest pain, nausea, vomiting, abdominal pain, constipation/diarrhea  Physical Exam: Vitals:   01/12/23 0207 01/12/23 0230 01/12/23 0322 01/12/23 0400  BP:  (!) 109/51 (!) 118/59   Pulse:  66 64   Resp:  15 20   Temp: 98.4 F (36.9 C)  97.8 F  (36.6 C)   TempSrc: Axillary     SpO2:  100% 97%   Weight:    64.4 kg  Height:    5\' 8"  (1.727 m)   Physical Exam Constitutional:      General: He is not in acute distress.    Appearance: He is ill-appearing.     Comments: Thin built  HENT:     Nose: Nose normal. No congestion.     Mouth/Throat:     Mouth: Mucous membranes are dry.     Pharynx: Oropharynx is clear. No oropharyngeal exudate.  Eyes:     Conjunctiva/sclera: Conjunctivae normal.     Pupils: Pupils are equal, round, and reactive to light.  Cardiovascular:     Rate and Rhythm: Normal rate and regular rhythm.     Pulses: Normal pulses.     Heart sounds: Normal heart sounds. No murmur heard. Pulmonary:     Effort: Pulmonary effort is normal.     Breath sounds: Normal breath sounds.  Abdominal:     General: Abdomen is flat. Bowel sounds are normal.     Palpations: Abdomen is soft.  Musculoskeletal:        General: No swelling or tenderness. Normal range of motion.     Cervical back: Normal range of motion and neck supple.  Skin:    General: Skin is warm and dry.     Capillary Refill: Capillary refill takes 2 to 3 seconds.  Neurological:     General: No focal deficit present.     Mental Status: He is alert and oriented to person, place, and time.  Cranial Nerves: No cranial nerve deficit.     Sensory: No sensory deficit.     Motor: No weakness.  Psychiatric:        Mood and Affect: Mood normal.     Data Reviewed:  Latest Reference Range & Units 01/11/23 23:35 01/12/23 05:08  Lactic Acid, Venous 0.5 - 1.9 mmol/L 2.1 (HH) 2.5 (HH)  WBC 4.0 - 10.5 K/uL 6.3   RBC 4.22 - 5.81 MIL/uL 3.05 (L)   Hemoglobin 13.0 - 17.0 g/dL 40.9 (L)   HCT 81.1 - 91.4 % 30.9 (L)   MCV 80.0 - 100.0 fL 101.3 (H)   MCH 26.0 - 34.0 pg 33.8   MCHC 30.0 - 36.0 g/dL 78.2   RDW 95.6 - 21.3 % 17.5 (H)   Platelets 150 - 400 K/uL 211   nRBC 0.0 - 0.2 % 0.0   Neutrophils % 75   Lymphocytes % 11   Monocytes Relative % 12   Eosinophil  % 1   Basophil % 1   Immature Granulocytes % 0   NEUT# 1.7 - 7.7 K/uL 4.8   Lymphocyte # 0.7 - 4.0 K/uL 0.7   Monocyte # 0.1 - 1.0 K/uL 0.7   Eosinophils Absolute 0.0 - 0.5 K/uL 0.0   Basophils Absolute 0.0 - 0.1 K/uL 0.0   Abs Immature Granulocytes 0.00 - 0.07 K/uL 0.01   Glucose 70 - 99 mg/dL 086 (H)     Latest Reference Range & Units 01/11/23 23:35  Sodium 135 - 145 mmol/L 136  Potassium 3.5 - 5.1 mmol/L 4.3  Chloride 98 - 111 mmol/L 100  CO2 22 - 32 mmol/L 28  Glucose 70 - 99 mg/dL 578 (H)  BUN 8 - 23 mg/dL 18  Creatinine 4.69 - 6.29 mg/dL 5.28  Calcium 8.9 - 41.3 mg/dL 8.6 (L)  Anion gap 5 - 15  8  Alkaline Phosphatase 38 - 126 U/L 92  Albumin 3.5 - 5.0 g/dL 2.7 (L)  AST 15 - 41 U/L 75 (H)  ALT 0 - 44 U/L 36  Total Protein 6.5 - 8.1 g/dL 6.1 (L)  Total Bilirubin 0.3 - 1.2 mg/dL 0.7  (H): Data is abnormally high (L): Data is abnormally low  Family Communication: Updated sister in law at bedside  Disposition: Status is: Inpatient   Planned Discharge Destination: Home    Time spent: 35 minutes  Author: Ernestene Mention, MD 01/12/2023 8:01 AM  For on call review [email or web address].

## 2023-01-12 NOTE — Progress Notes (Signed)
Provider made aware that patient chemo reservoir that's connected to port-a-cath was beeping and is finished. Patient said he usually gets it refilled at the outpatient chemo center. I asked does he disconnect anything and he said no he leaves it connected, turns it off, and takes it straight to them to refill. Patient stated medication usually last 2 days.

## 2023-01-12 NOTE — Assessment & Plan Note (Signed)
At high risk for infection due to chemotherapy Vancomycin and cefepime IV fluids DuoNebs, antitussives, incentive spirometry

## 2023-01-12 NOTE — Progress Notes (Signed)
Pharmacy Antibiotic Note  Aaron Short is a 77 y.o. male admitted on 01/11/2023 with pneumonia.  Pharmacy has been consulted for Vancomycin, Cefepime  dosing.  Plan: Cefepime 2 gm IV X 1 given in ED on 8/16 @ 0027. Cefepime 2 gm IV Q8H ordered to start on 8/16 @ 0830.  Vancomycin 1500 mg IV X 1 given in ED on 8/16 @ 0127. Vancomycin 1250 mg IV Q24H ordered to start on 8/17 @ 0100.  AUC = 479.0 Vanc trough = 479.9   Weight: 61.7 kg (136 lb 1.6 oz)  Temp (24hrs), Avg:98.4 F (36.9 C), Min:98.4 F (36.9 C), Max:98.4 F (36.9 C)  Recent Labs  Lab 01/10/23 0814 01/11/23 2335  WBC 7.2 6.3  CREATININE 0.67 0.69  LATICACIDVEN  --  2.1*    Estimated Creatinine Clearance: 65.3 mL/min (by C-G formula based on SCr of 0.69 mg/dL).    Allergies  Allergen Reactions   Diltiazem Swelling    Leg edema Leg edema Leg edema Leg edema Leg edema Leg edema Leg edema Leg edema Leg edema   Penicillins Hives and Rash    Had hives as child Other reaction(s): RASH Childhood allergy Other reaction(s): RASH Other reaction(s): RASH Childhood allergy Other reaction(s): RASH Other reaction(s): RASH Childhood allergy   Tizanidine Other (See Comments)    Other reaction(s): OTHER  Pt states it puts him out Other reaction(s): OTHER  Pt states it puts him out Other reaction(s): OTHER  Pt states it puts him out Other reaction(s): OTHER  Pt states it puts him out Other reaction(s): OTHER  Pt states it puts him out Other reaction(s): OTHER  Pt states it puts him out    Antimicrobials this admission:   >>    >>   Dose adjustments this admission:   Microbiology results:  BCx:   UCx:    Sputum:    MRSA PCR:   Thank you for allowing pharmacy to be a part of this patient's care.  Refoel Palladino D 01/12/2023 2:56 AM

## 2023-01-12 NOTE — Consult Note (Signed)
Hematology/Oncology Consult note [phone] [phone]      Patient Care Team: Rosemarie Ax, MD as PCP - General Benita Gutter, RN as Oncology Nurse Navigator Rickard Patience, MD as Consulting Physician (Oncology)   Name of the patient: Aaron Short  [id number]  1946-02-29   REASON FOR COSULTATION:  Esophageal cancer and bladder cancer History of presenting illness-  77 y.o. male with PMH listed at below who presents to ER for evaluation of altered mental status, weakness, status post fall. Apparently patient was by himself at home, had a fall on 01/11/2023. Patient's wife returned home and found him being weak, not confused.  He shortly began and had left arm skin tear. Over the rest of the afternoon, patient became increasingly confused.  He was brought to the ER for further evaluation.  CT head and cervical spine showed no acute fracture or intracranial bleeding. He was found to have elevated lactic acid. He is currently on FOLFOX for treatment of esophageal cancer, last treatment was 01/10/2023.  It was felt that he is at risk of infection.  Chest x-ray showed possible patchy superimposed opacities.  Empiric antibiotics was given in the emergency room.  So far infectious workup has been negative.  He has been off antibiotics.  Patient is currently admitted for acute metabolic encephalopathy, frequent falls. Patient is Mediport was still accessed to his 5-FU pump.  Infusion has finished.  Cancer center RN has deaccessed the pump this morning. Oncology was consulted for further evaluation.  Patien's sister-in-law was at the bedside.  Patient appears to be less confused today. He denies any choking episodes when eating.  No fever or chills.  He denies any nausea vomiting currently.   Allergies  Allergen Reactions   Diltiazem Swelling    Leg edema Leg edema Leg edema Leg edema Leg edema Leg edema Leg edema Leg edema Leg edema   Penicillins  Hives and Rash    Had hives as child Other reaction(s): RASH Childhood allergy Other reaction(s): RASH Other reaction(s): RASH Childhood allergy Other reaction(s): RASH Other reaction(s): RASH Childhood allergy   Tizanidine Other (See Comments)    Other reaction(s): OTHER  Pt states it puts him out Other reaction(s): OTHER  Pt states it puts him out Other reaction(s): OTHER  Pt states it puts him out Other reaction(s): OTHER  Pt states it puts him out Other reaction(s): OTHER  Pt states it puts him out Other reaction(s): OTHER  Pt states it puts him out    Patient Active Problem List   Diagnosis Date Noted   Esophageal adenocarcinoma (HCC) 11/03/2022    Priority: High   Chemotherapy-induced nausea 01/10/2023    Priority: Medium    Mucositis 01/10/2023    Priority: Medium    Compression fracture of lumbar vertebra (HCC) 12/13/2022    Priority: Medium    Protein calorie malnutrition (HCC) 11/03/2022    Priority: Medium    Neoplasm related pain 05/18/2015    Priority: Medium    Dysuria 11/29/2022    Priority: Low   Chemotherapy induced diarrhea 11/22/2022    Priority: Low   Goals of care, counseling/discussion 11/03/2022    Priority: Low   Malignant neoplasm of urinary bladder (HCC) 10/23/2013    Priority: Low   Atrial fibrillation and flutter (HCC) 01/14/2013    Priority: Low   Frequent falls 01/12/2023   At high risk for infection due to chemotherapy 01/12/2023   Acute metabolic encephalopathy 01/12/2023   Malnutrition of moderate degree 01/12/2023  Closed compression fracture of body of L1 vertebra (HCC) 01/02/2023   Closed fracture of lumbar vertebral body (HCC) 12/04/2022   Traumatic rhabdomyolysis (HCC) 12/04/2022   Atrial fibrillation, chronic (HCC) 12/04/2022   Encounter for antineoplastic chemotherapy 11/15/2022   Esophageal mass 10/30/2022   Intercostal neuralgia 10/12/2022   Multiple closed fractures of ribs of left side 10/12/2022   Abnormal drug  screen 10/05/2022   Tick bite 10/05/2022   Rib pain on left side 10/05/2022   Dysphagia 09/21/2022   Melena 03/23/2022   Pneumonia 03/20/2022   Osteoporosis 05/02/2021   Weight loss 12/29/2020   Adenocarcinoma of esophagus, stage 4 (HCC) 12/29/2020   Nightmares associated with chronic post-traumatic stress disorder 12/09/2019   Current moderate episode of major depressive disorder without prior episode (HCC) 11/14/2019   Risk for falls 07/10/2019   T12 compression fracture, sequela 07/09/2019   Chronic shoulder pain (Left) 07/09/2019   Chronic left-sided thoracic back pain 03/10/2019   Acute pain of left shoulder 03/10/2019   Acute midline thoracic back pain 03/10/2019   Pharmacologic therapy 12/31/2018   Disorder of skeletal system 12/31/2018   Problems influencing health status 12/31/2018   DDD (degenerative disc disease), cervical 12/31/2018   DDD (degenerative disc disease), lumbosacral 12/31/2018   Lumbosacral Grade 1 Retrolisthesis of L5/S1 12/31/2018   Neurogenic pain 12/31/2018   Chronic hip pain after THR (Bilateral) 12/31/2018   Strain of lumbar region 10/15/2017   Chronic wound of head 07/02/2017   Hematoma of leg, left, initial encounter 03/30/2017   Need for influenza vaccination 02/26/2017   Chronic acromioclavicular joint pain (Left) 09/05/2016   Osteoarthritis of shoulder (Bilateral) (L>R) 09/05/2016   Acromioclavicular arthrosis (Bilateral) (L>R) 09/05/2016   Arthralgia of acromioclavicular joint (Bilateral) (L>R) 09/05/2016   Obesity, Class I, BMI 30-34.9 05/11/2016   Chronic pain syndrome 05/11/2016   FH: atrial fibrillation    Smoker 01/07/2016   Disturbance of skin sensation 11/17/2015   Lumbar facet syndrome (Bilateral) (R>L) 08/16/2015   Lumbar spondylosis 08/16/2015   Chronic, continuous use of opioids 07/05/2015   Long term prescription opiate use 05/18/2015   Opiate use (30 MME/Day) 05/18/2015   Encounter for therapeutic drug level monitoring  05/18/2015   Encounter for chronic pain management 05/18/2015   Opioid dependence, daily use (HCC) 05/18/2015   Chronic hip pain (Secondary area of Pain) (Bilateral) (R>L) 05/18/2015   Chronic shoulder pain (Primary Area of Pain) (Bilateral) (L>R) 05/18/2015   Chronic low back pain (Third area of Pain) (Bilateral) (R>L) 05/18/2015   Chronic knee pain (Bilateral) (R>L) 05/18/2015   S/P THR: total hip replacement (Bilateral) 05/18/2015   S/P TKR: total knee replacement (Bilateral) 05/18/2015   History of bladder cancer 05/18/2015   History of hip fracture 05/18/2015   History of pelvic fracture 05/18/2015   Long-term use of high-risk medication 05/18/2015   Chronic neck pain 05/18/2015   Chondrocalcinosis 05/18/2015   Chronic shoulder pain (Radicular) 05/18/2015   History of shoulder surgery 05/18/2015   Myofascial pain 05/18/2015   Chronic musculoskeletal pain 05/18/2015   Muscle spasm 05/18/2015   Pure hypercholesterolemia 12/31/2014   Essential hypertension 12/31/2014   Muscle spasticity 07/30/2014   Involuntary muscle contractions 07/30/2014   Malignant neoplasm of bladder (HCC) 10/23/2013   Malignant neoplasm of overlapping sites of bladder (HCC) 10/23/2013   Chronic back pain greater than 3 months duration 09/16/2013   Lower urinary tract infectious disease 09/16/2013   Mixed hyperlipidemia 06/18/2009   Hypertension, benign 06/18/2009   Impaired fasting glucose 06/18/2009  Past Medical History:  Diagnosis Date   Arthralgia of lower leg 04/02/2012   Arthralgia of upper arm 06/18/2009   Cancer (HCC)    Diabetes mellitus without complication (HCC)    FH: atrial fibrillation    Hypertension      Past Surgical History:  Procedure Laterality Date   bladder cancer surgery  2015   ESOPHAGOGASTRODUODENOSCOPY (EGD) WITH PROPOFOL N/A 10/30/2022   Procedure: ESOPHAGOGASTRODUODENOSCOPY (EGD) WITH PROPOFOL;  Surgeon: Toney Reil, MD;  Location: [facility] ENDOSCOPY;  Service:  Gastroenterology;  Laterality: N/A;   IR IMAGING GUIDED PORT INSERTION  11/13/2022   JOINT REPLACEMENT     REPLACEMENT TOTAL HIP W/  RESURFACING IMPLANTS Bilateral    SHOULDER SURGERY Bilateral    TOTAL KNEE ARTHROPLASTY Bilateral     Social History   Socioeconomic History   Marital status: Married    Spouse name: Not on file   Number of children: Not on file   Years of education: Not on file   Highest education level: Not on file  Occupational History   Not on file  Tobacco Use   Smoking status: Every Day    Current packs/day: 0.50    Types: Cigarettes   Smokeless tobacco: Never  Vaping Use   Vaping status: Never Used  Substance and Sexual Activity   Alcohol use: No    Alcohol/week: 0.0 standard drinks of alcohol   Drug use: No   Sexual activity: Not on file  Other Topics Concern   Not on file  Social History Narrative   Lives with wife, [name].    Social Determinants of Health   Financial Resource Strain: Low Risk  (11/03/2022)   Overall Financial Resource Strain (CARDIA)    Difficulty of Paying Living Expenses: Not very hard  Food Insecurity: No Food Insecurity (01/12/2023)   Hunger Vital Sign    Worried About Running Out of Food in the Last Year: Never true    Ran Out of Food in the Last Year: Never true  Transportation Needs: No Transportation Needs (01/12/2023)   PRAPARE - Administrator, Civil Service (Medical): No    Lack of Transportation (Non-Medical): No  Physical Activity: Sufficiently Active (02/16/2021)   Received [facility], [facility]   Exercise Vital Sign    Days of Exercise per Week: 7 days    Minutes of Exercise per Session: 150+ min  Stress: No Stress Concern Present (11/03/2022)   [employer] of Occupational Health - Occupational Stress Questionnaire    Feeling of Stress : Only a little  Social Connections: Moderately Integrated (02/16/2021)   Received [facility], [facility]   Social Connection and  Isolation Panel [NHANES]    Frequency of Communication with Friends and Family: Three times a week    Frequency of Social Gatherings with Friends and Family: Three times a week    Attends Religious Services: Never    Active Member of Clubs or Organizations: Yes    Attends Banker Meetings: More than 4 times per year    Marital Status: Married  Catering manager Violence: Not At Risk (01/12/2023)   Humiliation, Afraid, Rape, and Kick questionnaire    Fear of Current or Ex-Partner: No    Emotionally Abused: No    Physically Abused: No    Sexually Abused: No     Family History  Problem Relation Age of Onset   Dementia Mother    Heart disease Father  Current Facility-Administered Medications:    acetaminophen (TYLENOL) tablet 650 mg, 650 mg, Oral, Q6H PRN **OR** acetaminophen (TYLENOL) suppository 650 mg, 650 mg, Rectal, Q6H PRN, Andris Baumann, MD   albuterol (PROVENTIL) (2.5 MG/3ML) 0.083% nebulizer solution 2.5 mg, 2.5 mg, Nebulization, Q2H PRN, Andris Baumann, MD   ceFEPIme (MAXIPIME) 2 g in sodium chloride 0.9 % 100 mL IVPB, 2 g, Intravenous, Q8H, Lindajo Royal V, MD, Last Rate: 200 mL/hr at 01/12/23 0822, 2 g at 01/12/23 1027   Chlorhexidine Gluconate Cloth 2 % PADS 6 each, 6 each, Topical, Daily, Kadali, Renuka A, MD, 6 each at 01/12/23 1419   feeding supplement (ENSURE ENLIVE / ENSURE PLUS) liquid 237 mL, 237 mL, Oral, TID BM, Kadali, Renuka A, MD   lactated ringers infusion, , Intravenous, Continuous, Kadali, Renuka A, MD, Last Rate: 50 mL/hr at 01/12/23 1247, Rate Change at 01/12/23 1247   magic mouthwash w/lidocaine, 5 mL, Oral, QID PRN, Andris Baumann, MD   multivitamin with minerals tablet 1 tablet, 1 tablet, Oral, Daily, Kadali, Renuka A, MD   ondansetron (ZOFRAN) tablet 4 mg, 4 mg, Oral, Q6H PRN **OR** ondansetron (ZOFRAN) injection 4 mg, 4 mg, Intravenous, Q6H PRN, Lindajo Royal V, MD   oxyCODONE (Oxy IR/ROXICODONE) immediate release tablet 10 mg, 10 mg,  Oral, Q4H PRN, Lindajo Royal V, MD   oxyCODONE (Oxy IR/ROXICODONE) immediate release tablet 20 mg, 20 mg, Oral, Q4H PRN, Andris Baumann, MD   rivaroxaban (XARELTO) tablet 20 mg, 20 mg, Oral, Q supper, Lindajo Royal V, MD   sertraline (ZOLOFT) tablet 100 mg, 100 mg, Oral, QHS, Andris Baumann, MD  Review of Systems  Constitutional:  Positive for fatigue and unexpected weight change. Negative for appetite change, chills and fever.  HENT:   Negative for hearing loss and voice change.   Eyes:  Negative for eye problems and icterus.  Respiratory:  Negative for chest tightness, cough and shortness of breath.   Cardiovascular:  Negative for chest pain and leg swelling.  Gastrointestinal:  Negative for abdominal distention and abdominal pain.  Endocrine: Negative for hot flashes.  Genitourinary:  Negative for difficulty urinating, dysuria and frequency.   Musculoskeletal:  Negative for arthralgias.  Skin:  Negative for itching and rash.  Neurological:  Negative for light-headedness and numbness.       Status post fall  Hematological:  Negative for adenopathy. Does not bruise/bleed easily.  Psychiatric/Behavioral:  Positive for confusion.     PHYSICAL EXAM Vitals:   01/12/23 0230 01/12/23 0322 01/12/23 0400 01/12/23 0840  BP: (!) 109/51 (!) 118/59  (!) 111/57  Pulse: 66 64  63  Resp: 15 20  16   Temp:  97.8 F (36.6 C)  98 F (36.7 C)  TempSrc:      SpO2: 100% 97%  96%  Weight:   141 lb 15.6 oz (64.4 kg)   Height:   5\' 8"  (1.727 m)    Physical Exam Constitutional:      General: He is not in acute distress.    Appearance: He is not diaphoretic.  HENT:     Head: Normocephalic and atraumatic.  Eyes:     General: No scleral icterus.    Pupils: Pupils are equal, round, and reactive to light.  Cardiovascular:     Rate and Rhythm: Normal rate and regular rhythm.     Heart sounds: No murmur heard. Pulmonary:     Effort: Pulmonary effort is normal. No respiratory distress.  Abdominal:  General: There is no distension.     Palpations: Abdomen is soft.     Tenderness: There is no abdominal tenderness.  Musculoskeletal:        General: Normal range of motion.     Cervical back: Normal range of motion and neck supple.  Skin:    General: Skin is warm and dry.     Findings: No erythema.  Neurological:     Mental Status: He is alert and oriented to person, place, and time. Mental status is at baseline.     Cranial Nerves: No cranial nerve deficit.     Motor: No abnormal muscle tone.  Psychiatric:        Mood and Affect: Mood and affect normal.       LABORATORY STUDIES    Latest Ref Rng & Units 01/12/2023    3:24 PM 01/11/2023   11:35 PM 01/10/2023    8:14 AM  CBC  WBC 4.0 - 10.5 K/uL 6.1  6.3  7.2   Hemoglobin 13.0 - 17.0 g/dL 41.6  60.6  30.1   Hematocrit 39.0 - 52.0 % 31.9  30.9  31.5   Platelets 150 - 400 K/uL 234  211  206       Latest Ref Rng & Units 01/12/2023    3:24 PM 01/11/2023   11:35 PM 01/10/2023    8:14 AM  CMP  Glucose 70 - 99 mg/dL 93  601  093   BUN 8 - 23 mg/dL 16  18  15    Creatinine 0.61 - 1.24 mg/dL 2.35  5.73  2.20   Sodium 135 - 145 mmol/L 135  136  134   Potassium 3.5 - 5.1 mmol/L 4.1  4.3  3.7   Chloride 98 - 111 mmol/L 100  100  99   CO2 22 - 32 mmol/L 25  28  27    Calcium 8.9 - 10.3 mg/dL 8.6  8.6  8.9   Total Protein 6.5 - 8.1 g/dL  6.1  6.4   Total Bilirubin 0.3 - 1.2 mg/dL  0.7  0.4   Alkaline Phos 38 - 126 U/L  92  113   AST 15 - 41 U/L  75  34   ALT 0 - 44 U/L  36  20      RADIOGRAPHIC STUDIES: I have personally reviewed the radiological images as listed and agreed with the findings in the report. CT HEAD WO CONTRAST ( )  Result Date: 01/11/2023 CLINICAL DATA:  Recent fall with headaches and neck pain, initial encounter EXAM: CT HEAD WITHOUT CONTRAST CT CERVICAL SPINE WITHOUT CONTRAST TECHNIQUE: Multidetector CT imaging of the head and cervical spine was performed following the standard protocol without intravenous  contrast. Multiplanar CT image reconstructions of the cervical spine were also generated. RADIATION DOSE REDUCTION: This exam was performed according to the departmental dose-optimization program which includes automated exposure control, adjustment of the mA and/or kV according to patient size and/or use of iterative reconstruction technique. COMPARISON:  12/04/2022 FINDINGS: CT HEAD FINDINGS Brain: No evidence of acute infarction, hemorrhage, hydrocephalus, extra-axial collection or mass lesion/mass effect. Chronic atrophic and ischemic changes are again noted. Vascular: No hyperdense vessel or unexpected calcification. Skull: Normal. Negative for fracture or focal lesion. Sinuses/Orbits: No acute finding. Other: None. CT CERVICAL SPINE FINDINGS Alignment: Within normal limits. Skull base and vertebrae: 7 cervical segments are well visualized. Multilevel osteophytic change and facet hypertrophic changes seen. No compression deformity is noted. No acute fracture or acute facet abnormality is  seen. Soft tissues and spinal canal: Surrounding soft tissue structures are within normal limits. Upper chest: Visualized lung apices demonstrate mild pleuroparenchymal scarring particularly on the left. Other: None IMPRESSION: CT of the head: Chronic atrophic and ischemic changes without acute abnormality. CT of cervical spine: Multilevel degenerative change without acute abnormality. Electronically Signed   By: Alcide Clever M.D.   On: 01/11/2023 23:42   CT Cervical Spine Wo Contrast  Result Date: 01/11/2023 CLINICAL DATA:  Recent fall with headaches and neck pain, initial encounter EXAM: CT HEAD WITHOUT CONTRAST CT CERVICAL SPINE WITHOUT CONTRAST TECHNIQUE: Multidetector CT imaging of the head and cervical spine was performed following the standard protocol without intravenous contrast. Multiplanar CT image reconstructions of the cervical spine were also generated. RADIATION DOSE REDUCTION: This exam was performed  according to the departmental dose-optimization program which includes automated exposure control, adjustment of the mA and/or kV according to patient size and/or use of iterative reconstruction technique. COMPARISON:  12/04/2022 FINDINGS: CT HEAD FINDINGS Brain: No evidence of acute infarction, hemorrhage, hydrocephalus, extra-axial collection or mass lesion/mass effect. Chronic atrophic and ischemic changes are again noted. Vascular: No hyperdense vessel or unexpected calcification. Skull: Normal. Negative for fracture or focal lesion. Sinuses/Orbits: No acute finding. Other: None. CT CERVICAL SPINE FINDINGS Alignment: Within normal limits. Skull base and vertebrae: 7 cervical segments are well visualized. Multilevel osteophytic change and facet hypertrophic changes seen. No compression deformity is noted. No acute fracture or acute facet abnormality is seen. Soft tissues and spinal canal: Surrounding soft tissue structures are within normal limits. Upper chest: Visualized lung apices demonstrate mild pleuroparenchymal scarring particularly on the left. Other: None IMPRESSION: CT of the head: Chronic atrophic and ischemic changes without acute abnormality. CT of cervical spine: Multilevel degenerative change without acute abnormality. Electronically Signed   By: Alcide Clever M.D.   On: 01/11/2023 23:42   DG Chest Portable 1 View  Result Date: 01/11/2023 CLINICAL DATA:  Shortness of breath EXAM: PORTABLE CHEST 1 VIEW COMPARISON:  Chest CT 12/04/2022 FINDINGS: Right-sided central venous port tip at the distal SVC. Evidence of chronic lung disease with coarse interstitial pattern. Possible patchy superimposed opacities. Stable cardiomediastinal silhouette. No pneumothorax. IMPRESSION: Chronic lung disease with possible patchy superimposed opacities which may reflect minimal pneumonia, to include atypical organism. Electronically Signed   By: Jasmine Pang M.D.   On: 01/11/2023 23:30   DG Lumbar Spine 2-3  Views  Result Date: 01/07/2023 CLINICAL DATA:  L1 fracture, fell 2 weeks ago EXAM: LUMBAR SPINE - 2-3 VIEW COMPARISON:  12/04/2022, 10/20/2022 FINDINGS: Frontal and lateral views of the lumbar spine are obtained. Mild compression deformity of the superior endplate of L1, as well as the moderate anterior wedge compression deformity of L2, are unchanged since the recent CT exam. No new fractures. Stable multilevel spondylosis and facet hypertrophy, greatest from L1-2 through L3-4. Partial visualization of bilateral hip arthroplasties. IMPRESSION: 1. Stable chronic compression deformities at L1 and L2, unchanged since the previous exam. 2. Stable multilevel lumbar degenerative change. Electronically Signed   By: Sharlet Salina M.D.   On: 01/07/2023 12:00   CT T-SPINE NO CHARGE  Result Date: 12/04/2022 CLINICAL DATA:  Fall. EXAM: CT THORACIC SPINE WITHOUT CONTRAST TECHNIQUE: Multidetector CT images of the thoracic were obtained using the standard protocol without intravenous contrast. RADIATION DOSE REDUCTION: This exam was performed according to the departmental dose-optimization program which includes automated exposure control, adjustment of the mA and/or kV according to patient size and/or use of iterative reconstruction technique.  COMPARISON:  Chest CT today. PET CT 11/13/2022. Outside CT 10/20/2022. FINDINGS: Alignment: Normal Vertebrae: Chronic compression fractures involving the T12 and L2 vertebral body are stable since prior CT. Mild compression fracture through the superior endplate of L1 and superior endplate of T11 are new since prior study. Paraspinal and other soft tissues: Negative. Disc levels: Diffuse degenerative disc disease. Anterior and lateral spurring. IMPRESSION: Mild compression fractures through the superior endplates of T11 and L1, new since prior CT. Stable chronic mild compression fractures at T12 and L2. Electronically Signed   By: Charlett Nose M.D.   On: 12/04/2022 01:20   CT CHEST  WO CONTRAST  Result Date: 12/04/2022 CLINICAL DATA:  fall, eval rib fx, ptx.  Esophageal cancer. EXAM: CT CHEST WITHOUT CONTRAST TECHNIQUE: Multidetector CT imaging of the chest was performed following the standard protocol without IV contrast. RADIATION DOSE REDUCTION: This exam was performed according to the departmental dose-optimization program which includes automated exposure control, adjustment of the mA and/or kV according to patient size and/or use of iterative reconstruction technique. COMPARISON:  Outside study 10/20/2022.  PET CT 11/13/2022. FINDINGS: Cardiovascular: Coronary artery and aortic calcifications. Heart is normal size. Aorta is normal caliber. Mediastinum/Nodes: No mediastinal, hilar, or axillary adenopathy. Trachea and thyroid unremarkable. Mild distal esophageal wall thickening. Lungs/Pleura: Changes of fibrosis peripherally in the lungs. No acute confluent airspace opacities or effusions. No pneumothorax. Upper Abdomen: Numerous hepatic metastases as seen on prior study. Musculoskeletal: No visible rib fracture. Chronic compression fractures noted at T12 and L2. Mild compression fracture through the superior endplate of L1 is new since prior CT. Chest wall soft tissues unremarkable. IMPRESSION: Mild new compression fracture through the superior endplate of L1. Stable chronic compression fractures at T12 and L2. Coronary artery disease, aortic atherosclerosis. Mild wall thickening in the distal esophagus in the area of non esophageal cancer as seen on prior PET CT. Extensive a patent metastases. Fibrosis peripherally in the lungs.  No pneumothorax or effusion. Aortic Atherosclerosis (ICD10-I70.0). Electronically Signed   By: Charlett Nose M.D.   On: 12/04/2022 00:40   CT Lumbar Spine Wo Contrast  Result Date: 12/04/2022 CLINICAL DATA:  Fall, tailbone pain. EXAM: CT LUMBAR SPINE WITHOUT CONTRAST TECHNIQUE: Multidetector CT imaging of the lumbar spine was performed without intravenous  contrast administration. Multiplanar CT image reconstructions were also generated. RADIATION DOSE REDUCTION: This exam was performed according to the departmental dose-optimization program which includes automated exposure control, adjustment of the mA and/or kV according to patient size and/or use of iterative reconstruction technique. COMPARISON:  PET CT 11/13/2022. outside CT 10/20/2022 FINDINGS: Segmentation: 5 lumbar type vertebrae. Alignment: Normal. Vertebrae: Moderate compression fractures involving T12 and L2. Mild compression fracture through the superior endplate of L1. Paraspinal and other soft tissues: Negative Disc levels: Diffuse degenerative facet disease. Anterior spurring throughout the lower thoracic and lumbar spine. Vacuum disc noted. IMPRESSION: Mild to moderate compression fractures involving T12, L1 and L2 vertebral bodies. The T12 and L2 vertebral bodies are chronic, seen on outside CT from 10/20/2022. The L1 compression fracture is new since that study. Electronically Signed   By: Charlett Nose M.D.   On: 12/04/2022 00:35   CT Cervical Spine Wo Contrast  Result Date: 12/04/2022 CLINICAL DATA:  Fall EXAM: CT CERVICAL SPINE WITHOUT CONTRAST TECHNIQUE: Multidetector CT imaging of the cervical spine was performed without intravenous contrast. Multiplanar CT image reconstructions were also generated. RADIATION DOSE REDUCTION: This exam was performed according to the departmental dose-optimization program which includes automated exposure control,  adjustment of the mA and/or kV according to patient size and/or use of iterative reconstruction technique. COMPARISON:  None Available. FINDINGS: Alignment: No subluxation Skull base and vertebrae: No acute fracture. No primary bone lesion or focal pathologic process. Soft tissues and spinal canal: No prevertebral fluid or swelling. No visible canal hematoma. Disc levels: Diffuse degenerative disc disease with disc space narrowing and anterior  spurring. Bilateral degenerative facet disease. Upper chest: No acute findings.  Biapical scarring. Other: None IMPRESSION: Cervical spondylosis.  No acute bony abnormality. Electronically Signed   By: Charlett Nose M.D.   On: 12/04/2022 00:28   CT HEAD WO CONTRAST ( )  Result Date: 12/04/2022 CLINICAL DATA:  Fall EXAM: CT HEAD WITHOUT CONTRAST TECHNIQUE: Contiguous axial images were obtained from the base of the skull through the vertex without intravenous contrast. RADIATION DOSE REDUCTION: This exam was performed according to the departmental dose-optimization program which includes automated exposure control, adjustment of the mA and/or kV according to patient size and/or use of iterative reconstruction technique. COMPARISON:  None Available. FINDINGS: Brain: There is atrophy and chronic small vessel disease changes. No acute intracranial abnormality. Specifically, no hemorrhage, hydrocephalus, mass lesion, acute infarction, or significant intracranial injury. Vascular: No hyperdense vessel or unexpected calcification. Skull: No acute calvarial abnormality. Sinuses/Orbits: No acute findings Other: None IMPRESSION: Atrophy, chronic microvascular disease. No acute intracranial abnormality. Electronically Signed   By: Charlett Nose M.D.   On: 12/04/2022 00:26   NM Cardiac Muga Rest  Result Date: 11/16/2022 CLINICAL DATA:  baseline heart function for chemo eval EXAM: NUCLEAR MEDICINE CARDIAC BLOOD POOL IMAGING (MUGA) TECHNIQUE: Cardiac multi-gated acquisition was performed at rest following intravenous injection of Tc-41m labeled red blood cells. RADIOPHARMACEUTICALS:  21.1 mCi Tc-49m pertechnetate in-vitro labeled red blood cells IV COMPARISON:  PET-CT, 11/13/2022 CTA chest 10/20/2022 FINDINGS: LEFT ventricular wall normal in size and demonstrate normal motion. Ventricular cavities normal. Calculated LEFT ejection fraction is calculated at 58%. No prior comparison MPI or MUGA. IMPRESSION: 1. Normal LEFT  ventricular wall motion. 2. Calculated LVEF 58% Electronically Signed   By: Roanna Banning M.D.   On: 11/16/2022 16:02   IR IMAGING GUIDED PORT INSERTION  Result Date: 11/13/2022 INDICATION: Esophageal adenocarcinoma. Patient presents for port catheter placement. EXAM: IMPLANTED PORT A CATH PLACEMENT WITH ULTRASOUND AND FLUOROSCOPIC GUIDANCE MEDICATIONS: None. ANESTHESIA/SEDATION: Versed 1 mg IV; Fentanyl 75 mcg IV; Moderate Sedation Time:  14 minutes The patient's vital signs and level of consciousness were continuously monitored during the procedure by the interventional radiology nurse under my direct supervision. FLUOROSCOPY: Radiation exposure index: 1 mGy reference air kerma COMPLICATIONS: None immediate. PROCEDURE: The right neck and chest was prepped with chlorhexidine, and draped in the usual sterile fashion using maximum barrier technique (cap and mask, sterile gown, sterile gloves, large sterile sheet, hand hygiene and cutaneous antiseptic). Local anesthesia was attained by infiltration with 1% lidocaine with epinephrine. Ultrasound demonstrated patency of the right internal jugular vein, and this was documented with an image. Under real-time ultrasound guidance, this vein was accessed with a 21 gauge micropuncture needle and image documentation was performed. A small dermatotomy was made at the access site with an 11 scalpel. A 0.018" wire was advanced into the SVC and the access needle exchanged for a 70F micropuncture vascular sheath. The 0.018" wire was then removed and a 0.035" wire advanced into the IVC. An appropriate location for the subcutaneous reservoir was selected below the clavicle and an incision was made through the skin and underlying soft tissues. The  subcutaneous tissues were then dissected using a combination of blunt and sharp surgical technique and a pocket was formed. A Bard ISP single lumen power injectable portacatheter was then tunneled through the subcutaneous tissues from the  pocket to the dermatotomy and the port reservoir placed within the subcutaneous pocket. The venous access site was then serially dilated and a peel away vascular sheath placed over the wire. The wire was removed and the port catheter advanced into position under fluoroscopic guidance. The catheter tip is positioned in the superior cavoatrial junction. This was documented with a spot image. The portacatheter was then tested and found to flush and aspirate well. The port was flushed with saline followed by 100 units/mL heparinized saline. The pocket was then closed in two layers using first subdermal inverted interrupted absorbable sutures followed by a running subcuticular suture. The epidermis was then sealed with Dermabond. The dermatotomy at the venous access site was also closed with Dermabond. IMPRESSION: Successful placement of a right IJ approach Power Port with ultrasound and fluoroscopic guidance. The catheter is ready for use. Electronically Signed   By: Malachy Moan M.D.   On: 11/13/2022 15:27   NM PET Image Initial (PI) Skull Base To Thigh  Result Date: 11/13/2022 CLINICAL DATA:  Initial treatment strategy for esophageal carcinoma. EXAM: NUCLEAR MEDICINE PET SKULL BASE TO THIGH TECHNIQUE: 7.65 mCi F-18 FDG was injected intravenously. Full-ring PET imaging was performed from the skull base to thigh after the radiotracer. CT data was obtained and used for attenuation correction and anatomic localization. Fasting blood glucose: 93 mg/dl COMPARISON:  CT 09/81/1914 FINDINGS: Mediastinal blood pool activity: SUV max 1.39 Liver activity: SUV max NA NECK: No hypermetabolic lymph nodes in the neck. Incidental CT findings: None. CHEST: Tracer avid tumor within the distal esophagus has an SUV max 9.97 with corresponding mild circumferential wall thickening on the CT images. No tracer avid mediastinal or hilar lymph nodes. Tiny nodule within the medial left lower lobe is too small to characterize by PET-CT  measuring 4 mm with SUV max 1.26, image 59/4. Incidental CT findings: Signs of chronic postinflammatory lung disease identified with bilateral peripheral interstitial reticulation. Aortic atherosclerosis with coronary artery calcifications. ABDOMEN/PELVIS: Extensive tracer avid liver metastases are identified throughout both lobes of the liver. Lesions are too numerous to count. Reference lesions include: -within segment 7/8 right hepatic lobe lesion measures 8.8 x 7.1 cm within SUV max of 30.72, image 72/4. -within segment 3 lesion measures 3.6 x 3.0 cm with SUV max of 25.08, image 88/4. -within segment 6 lesion measures 5.4 x 4.2 cm with SUV max 30.95, image 100/4. Tracer avid tumor extending from the distal esophagus into the cardia has an SUV max 14.45, image 75/4. No tracer avid abdominopelvic lymph nodes. Incidental CT findings: Aortic atherosclerosis. Small gallstones identified. SKELETON: No focal hypermetabolic activity to suggest skeletal metastasis. Incidental CT findings: There is asymmetric increased uptake localizing to the right first rib costosternal junction with SUV max 5.78, image 48/4. Asymmetric uptake localizing to the left [facility] joint is also noted. Status post bilateral hip arthroplasty. IMPRESSION: 1. Tracer avid tumor is identified within the distal esophagus extending into the cardia. 2. Extensive tracer avid liver metastases. 3. Tiny nodule within the medial left lower lobe is too small to characterize by PET-CT measuring 4 mm. Attention to this nodule on future surveillance imaging is advised. 4. Asymmetric uptake localizing to the right first rib costosternal junction and left AC joint is noted. Findings are favored to represent arthropathic  change 5. Gallstones. 6.  Aortic Atherosclerosis (ICD10-I70.0). Electronically Signed   By: Signa Kell M.D.   On: 11/13/2022 10:20   DG SWALLOW FUNC OP MEDICARE SPEECH PATH  Result Date: 11/09/2022 CLINICAL DATA:  Provided history: Cough,  unspecified type. Dysphagia, unspecified type. EXAM: MODIFIED BARIUM SWALLOW TECHNIQUE: Different consistencies of barium were administered orally to the patient by the speech pathologist. Imaging of the pharynx was performed in the lateral projection. Mina Marble, PA-C (supervised by Dr. Jackey Loge) was present in the fluoroscopy room for the study, and operated the fluoroscopy equipment. FLUOROSCOPY: Radiation Exposure Index (as provided by the fluoroscopic device): 6.70 mGy Kerma COMPARISON:  None. FINDINGS: Different consistencies of barium were administered orally to the patient by the speech pathologist with fluoroscopic imaging of the pharynx from a lateral projection. Mina Marble, PA-C (supervised by Dr. Jackey Loge) was present in the fluoroscopy room and operated the fluoroscopy equipment. The speech pathologist observed aspiration with thin and nectar consistencies. Please refer to the speech pathologist's report for full details. Bridging ventral cervical osteophyte noted at C4-C5. IMPRESSION: 1. Modified barium swallow as described. 2. Aspiration was observed with thin and nectar consistencies. Please refer to the speech pathologist's report for complete details and recommendations. Electronically Signed   By: Jackey Loge D.O.   On: 11/09/2022 14:06     Assessment and plan-   # Acute metabolic encephalopathy.  Neurochecks.  Fall precautions.  Recommend OT/PT evaluation. De-escalate pain regimen.  DC MS Contin.  Continue oxycodone as needed.  # Pneumonia, opacities on chest x-ray.  He has received empiric antibiotics in the ER Currently on cefepime Obtain speech swallow evaluation  # Metastatic esophageal adenocarcinoma, on chemotherapy FOLFOX and trastuzumab. Mediport has been the access today. CEA has responded very well and is trending down. Follow-up outpatient.  Plan was to resume treatment in 2 weeks if he recovers to his baseline.  Otherwise we will postpone treatment until  acute issue resolves.  # Neoplasm related pain, chronic pain syndrome  continue oxycodone as needed.   Hold off MS Contin due to acute encephalopathy.  # Malignant neoplasm of urinary bladder (HCC) Non-invasive bladder cancer. Follow up with urology. Last cystoscopy in August 2023 negative.  # Malnutrition, follow-up with nutritionist.  Continue nutrition supplements.  Thank you for allowing me to participate in the care of this patient.   Rickard Patience, MD, PhD Hematology Oncology 01/12/2023

## 2023-01-12 NOTE — ED Notes (Signed)
Provided urine incontinence care. Replaced bed linen, bed pads, and blankets. Pt repositioned, side rails up x2,  and call light placed back within reach. No further needs identified at this time.

## 2023-01-12 NOTE — ED Provider Notes (Signed)
Admission discussed with and accepted by Dr. Para March, hospitalist.    Sharyn Creamer, MD 01/12/23 [phone]

## 2023-01-12 NOTE — Progress Notes (Signed)
Initial Nutrition Assessment  DOCUMENTATION CODES:   Non-severe (moderate) malnutrition in context of chronic illness  INTERVENTION:   -Liberalize diet to regular for wider variety of meal selections -MVI with minerals daily -Ensure Enlive po TID, each supplement provides 350 kcal and 20 grams of protein -Magic cup TID with meals, each supplement provides 290 kcal and 9 grams of protein   NUTRITION DIAGNOSIS:   Moderate Malnutrition related to chronic illness (esopahgeal cancer) as evidenced by mild fat depletion, moderate fat depletion, mild muscle depletion, moderate muscle depletion.  GOAL:   Patient will meet greater than or equal to 90% of their needs  MONITOR:   PO intake, Supplement acceptance  REASON FOR ASSESSMENT:   Malnutrition Screening Tool    ASSESSMENT:   Pt with medical history significant for Atrial fibrillation on Xarelto, esophageal adenocarcinoma on chemo as well as bladder cancer, chemotherapy-induced diarrhea, chronic pain including cancer related pain on chronic opiates, admitted a month ago from 7/7-12/05/22 with a fall resulting in traumatic rhabdomyolysis, who now presents for evaluation of progressive confusion  Pt admitted with pneumonia.   Reviewed I/O's: +2.6 L x 24 hours  UOP: 2.3 L x 24 hours   Pt sitting in recliner chair at time of visit. History obtain from pt and sister at bedside. Pt reports feeling better today. He reports he has a fair appetite. He typically consumes 2-3 meals per day PTA (Breakfast: grits and eggs; Dinner: sandwich). Pt also makes smoothies with yogurt and fruit. He drinks 1-2 Ensure supplements daily and recently switched to the high protein, high calorie version. Per pt, he consumed about 75% of breakfast this morning and is hungry for lunch. Pt denies any difficulty chewing or swallowing foods.   Reviewed wt hx; wt has been stable over the past month. Pt shares that his UBW is around 190# and estimates he has lost  about 10# over the past month. He endorses losing about 50% over the past 8 months.   Pt denies any change in function status and prides himself on being active and independent.   Discussed importance of good meal and supplement intake to promote healing. Pt amenable to supplements.   Medications reviewed and include lactated ringers infusion @ 50 ml/hr.   Labs reviewed.    NUTRITION - FOCUSED PHYSICAL EXAM:  Flowsheet Row Most Recent Value  Orbital Region Mild depletion  Upper Arm Region Moderate depletion  Thoracic and Lumbar Region Mild depletion  Buccal Region Moderate depletion  Temple Region Moderate depletion  Clavicle Bone Region Moderate depletion  Clavicle and Acromion Bone Region Moderate depletion  Scapular Bone Region Moderate depletion  Dorsal Hand Moderate depletion  Patellar Region Mild depletion  Anterior Thigh Region Mild depletion  Posterior Calf Region Mild depletion  Edema (RD Assessment) None  Hair Reviewed  Eyes Reviewed  Mouth Reviewed  Skin Reviewed  Nails Reviewed       Diet Order:   Diet Order             Diet regular Fluid consistency: Thin  Diet effective now                   EDUCATION NEEDS:   Education needs have been addressed  Skin:  Skin Assessment: Skin Integrity Issues: Skin Integrity Issues:: Other (Comment) Other: skin tear lt elbow  Last BM:  01/10/23  Height:   Ht Readings from Last 1 Encounters:  01/12/23 5\' 8"  (1.727 m)    Weight:   Wt Readings from  Last 1 Encounters:  01/12/23 64.4 kg    Ideal Body Weight:  70 kg  BMI:  Body mass index is 21.59 kg/m.  Estimated Nutritional Needs:   Kcal:  1900-2100  Protein:  95-110 grams  Fluid:  > 1.9 L    Levada Schilling, RD, LDN, CDCES Registered Dietitian II Certified Diabetes Care and Education Specialist Please refer to [facility] for RD and/or RD on-call/weekend/after hours pager

## 2023-01-12 NOTE — Evaluation (Signed)
Occupational Therapy Evaluation Patient Details Name: Aaron Short MRN: [id number] DOB: June 21, 1945 Today's Date: 01/12/2023   History of Present Illness Pt is a 77 y/o presenting following multiple falls at home, AMS; admitted with pneumonia. PPMH includes Esophageal adenocarcinoma (HCC) on chemo,  Bladder cancer on surveillance cystoscopy, chronic pain, afib, DM, and HTN,Compression fracture of lumbar vertebra   Clinical Impression   Chart reviewed, pt greeted in bed, agreeable to OT evaluation. Pt is alert, oriented to self, place, date; grossly oriented to situation but does not remember events leading up to admission. PTA pt reports he is MOD I with ADL, has wife to assist, assist with IADLs. Pt presents with deficits in strength, endurance, activity tolerance, balance affecting safe and optimal ADL completion. Pt would benefit from ongoing OT to address deficits and to optimize safe ADL completion. Discussion with pt and sister in law re: recommendations for further therapy. OT will continue to follow acutely.       If plan is discharge home, recommend the following: A little help with walking and/or transfers;A little help with bathing/dressing/bathroom;Direct supervision/assist for medications management;Assist for transportation;Help with stairs or ramp for entrance;Direct supervision/assist for financial management    Functional Status Assessment  Patient has had a recent decline in their functional status and demonstrates the ability to make significant improvements in function in a reasonable and predictable amount of time.  Equipment Recommendations  BSC/3in1;Wheelchair (measurements OT);Tub/shower seat    Recommendations for Other Services       Precautions / Restrictions Precautions Precautions: Fall Restrictions Weight Bearing Restrictions: No      Mobility Bed Mobility Overal bed mobility: Needs Assistance Bed Mobility: Supine to Sit, Sit to Supine      Supine to sit: Supervision Sit to supine: Supervision        Transfers Overall transfer level: Needs assistance Equipment used: Rolling walker (2 wheels) Transfers: Sit to/from Stand Sit to Stand: Supervision                  Balance Overall balance assessment: Needs assistance Sitting-balance support: Feet supported Sitting balance-Leahy Scale: Good       Standing balance-Leahy Scale: Fair                             ADL either performed or assessed with clinical judgement   ADL Overall ADL's : Needs assistance/impaired     Grooming: Set up;Supervision/safety Grooming Details (indicate cue type and reason): anticipated             Lower Body Dressing: Minimal assistance;Sitting/lateral leans Lower Body Dressing Details (indicate cue type and reason): socks Toilet Transfer: Supervision/safety;Rolling walker (2 wheels) Toilet Transfer Details (indicate cue type and reason): simulated with RW         Functional mobility during ADLs: Supervision/safety;Rolling walker (2 wheels) (household distances in the room (up and down the bed) pt reports decreased endurance, not wanting to amb further)       Vision Patient Visual Report: No change from baseline       Perception         Praxis         Pertinent Vitals/Pain Pain Assessment Pain Assessment: 0-10 Pain Score: 3  Pain Location: generalized Pain Descriptors / Indicators: Discomfort Pain Intervention(s): Monitored during session, Repositioned     Extremity/Trunk Assessment Upper Extremity Assessment Upper Extremity Assessment: Overall WFL for tasks assessed   Lower Extremity Assessment Lower Extremity Assessment: Generalized weakness  RLE Deficits / Details: Hip flex 3/5, all other 4/5 RLE Sensation: WNL LLE Deficits / Details: Hip flex 3/5, all other 4/5 LLE Sensation: WNL   Cervical / Trunk Assessment Cervical / Trunk Assessment: Kyphotic   Communication  Communication Communication: No apparent difficulties Cueing Techniques: Verbal cues;Tactile cues;Visual cues   Cognition Arousal: Alert Behavior During Therapy: WFL for tasks assessed/performed Overall Cognitive Status: Impaired/Different from baseline Area of Impairment: Orientation, Attention, Memory, Following commands, Safety/judgement, Awareness, Problem solving                 Orientation Level: Disoriented to, Situation Current Attention Level: Sustained Memory: Decreased short-term memory Following Commands: Follows one step commands with increased time Safety/Judgement: Decreased awareness of deficits Awareness: Emergent Problem Solving: Slow processing General Comments: pt reports feeling frustrated with multiple doctors appts, wanting to be home     General Comments  vss throughout            Home Living Family/patient expects to be discharged to:: Private residence Living Arrangements: Spouse/significant other Available Help at Discharge: Family Type of Home: House Home Access: Stairs to enter [employer] of Steps: 4 (from garage) Entrance Stairs-Rails: Right;Left Home Layout: One level     Bathroom Shower/Tub: Producer, television/film/video: Handicapped height Bathroom Accessibility: Yes   Home Equipment: Agricultural consultant (2 wheels);Cane - single point          Prior Functioning/Environment Prior Level of Function : Needs assist       Physical Assist : ADLs (physical)   ADLs (physical): IADLs Mobility Comments: amb with RW, SIL reports pt does note pt does not amb with AD, fall history ADLs Comments: Wife assists with ADLs as needed per pt report, however he reports he performs grooming, dressing, toileting with MOD I;        OT Problem List: Impaired balance (sitting and/or standing);Decreased strength;Decreased activity tolerance;Decreased knowledge of use of DME or AE      OT Treatment/Interventions: Self-care/ADL  training;DME and/or AE instruction;Therapeutic activities;Therapeutic exercise;Patient/family education;Energy conservation    OT Goals(Current goals can be found in the care plan section) Acute Rehab OT Goals Patient Stated Goal: go home OT Goal Formulation: With patient Time For Goal Achievement: 01/26/23 Potential to Achieve Goals: Good ADL Goals Pt Will Perform Grooming: with modified independence;standing;sitting Pt Will Perform Lower Body Dressing: with modified independence;sit to/from stand Pt Will Transfer to Toilet: with modified independence;ambulating Pt Will Perform Toileting - Clothing Manipulation and hygiene: with modified independence;sit to/from stand  OT Frequency: Min 1X/week    Co-evaluation              AM-PAC OT "6 Clicks" Daily Activity     Outcome Measure Help from another person eating meals?: A Little Help from another person taking care of personal grooming?: None Help from another person toileting, which includes using toliet, bedpan, or urinal?: A Little Help from another person bathing (including washing, rinsing, drying)?: A Little Help from another person to put on and taking off regular upper body clothing?: None Help from another person to put on and taking off regular lower body clothing?: A Little 6 Click Score: 20   End of Session Equipment Utilized During Treatment: Rolling walker (2 wheels) Nurse Communication: Mobility status  Activity Tolerance: Patient tolerated treatment well Patient left: in bed;with call bell/phone within reach;with bed alarm set  OT Visit Diagnosis: Other abnormalities of gait and mobility (R26.89)  Time: 1610-9604 OT Time Calculation (min): 20 min Charges:  OT General Charges $OT Visit: 1 Visit OT Evaluation $OT Eval Moderate Complexity: 1 Mod  Oleta Mouse, OTD OTR/L  01/12/23, 4:28 PM

## 2023-01-12 NOTE — Addendum Note (Signed)
Encounter addended by: Meta Hatchet, RT on: 01/12/2023 1:47 PM  Actions taken: Imaging Exam ended

## 2023-01-13 DIAGNOSIS — D649 Anemia, unspecified: Secondary | ICD-10-CM | POA: Insufficient documentation

## 2023-01-13 DIAGNOSIS — G9340 Encephalopathy, unspecified: Secondary | ICD-10-CM | POA: Diagnosis not present

## 2023-01-13 DIAGNOSIS — R262 Difficulty in walking, not elsewhere classified: Secondary | ICD-10-CM | POA: Insufficient documentation

## 2023-01-13 DIAGNOSIS — E871 Hypo-osmolality and hyponatremia: Secondary | ICD-10-CM | POA: Insufficient documentation

## 2023-01-13 DIAGNOSIS — D6869 Other thrombophilia: Secondary | ICD-10-CM | POA: Insufficient documentation

## 2023-01-13 DIAGNOSIS — C679 Malignant neoplasm of bladder, unspecified: Secondary | ICD-10-CM | POA: Insufficient documentation

## 2023-01-13 DIAGNOSIS — J1569 Pneumonia due to other gram-negative bacteria: Secondary | ICD-10-CM | POA: Insufficient documentation

## 2023-01-13 LAB — CBC
HCT: 32.6 % — ABNORMAL LOW (ref 39.0–52.0)
Hemoglobin: 11.2 g/dL — ABNORMAL LOW (ref 13.0–17.0)
MCH: 34 pg (ref 26.0–34.0)
MCHC: 34.4 g/dL (ref 30.0–36.0)
MCV: 99.1 fL (ref 80.0–100.0)
Platelets: 242 10*3/uL (ref 150–400)
RBC: 3.29 MIL/uL — ABNORMAL LOW (ref 4.22–5.81)
RDW: 17.4 % — ABNORMAL HIGH (ref 11.5–15.5)
WBC: 6.2 10*3/uL (ref 4.0–10.5)
nRBC: 0 % (ref 0.0–0.2)

## 2023-01-13 LAB — BASIC METABOLIC PANEL
Anion gap: 6 (ref 5–15)
BUN: 19 mg/dL (ref 8–23)
CO2: 26 mmol/L (ref 22–32)
Calcium: 8.5 mg/dL — ABNORMAL LOW (ref 8.9–10.3)
Chloride: 101 mmol/L (ref 98–111)
Creatinine, Ser: 0.65 mg/dL (ref 0.61–1.24)
GFR, Estimated: >60 mL/min (ref >60)
Glucose, Bld: 85 mg/dL (ref 70–99)
Potassium: 4.3 mmol/L (ref 3.5–5.1)
Sodium: 133 mmol/L — ABNORMAL LOW (ref 135–145)

## 2023-01-13 MED ORDER — BOOST PLUS PO LIQD
237.0000 mL | Freq: Three times a day (TID) | ORAL | Status: DC
  Administered 2023-01-13 – 2023-01-14 (×2): 237 mL via ORAL
  Filled 2023-01-13: qty 237

## 2023-01-13 MED ORDER — ALUM & MAG HYDROXIDE-SIMETH 200-200-20 MG/5ML PO SUSP
30.0000 mL | ORAL | Status: DC | PRN
  Administered 2023-01-13: 30 mL via ORAL
  Filled 2023-01-13: qty 30

## 2023-01-13 NOTE — Assessment & Plan Note (Signed)
Acute encephalopathy, resolving Patient alert and very conversant Differential diagnosis includes a toxic encephalopathy secondary to the patient's home opiates or metabolic encephalopathy secondary to infection

## 2023-01-13 NOTE — Assessment & Plan Note (Signed)
Anemia Hemoglobin 11.2, stable Will Repeat in AM

## 2023-01-13 NOTE — Assessment & Plan Note (Signed)
Ambulatory dysfunction CT head and neck negative PT and OT consulted

## 2023-01-13 NOTE — Assessment & Plan Note (Signed)
Secondary hypercoagulable state due to atrial fibrillation Continue Xarelto 20 mg

## 2023-01-13 NOTE — Progress Notes (Signed)
  Progress Note   Patient: Aaron Short WUJ:[id number] DOB: 28-Jan-1946 DOA: 01/11/2023     1 DOS: the patient was seen and examined on 01/13/2023   Brief hospital course: 77 year old gentleman with a past medical history of both bladder cancer as well as esophageal cancer presents with a fall and encephalopathy subsequently diagnosed with pneumonia on IV cefepime.   Assessment and Plan: *  Gram-negative pneumonia Suspected gram-negative pneumonia In the  chemotherapy patient with multiple healthcare exposures Blood culture pending no growth to date Mycoplasma negative urinalysis negative Chest x-ray notable for superimposed opacities consistent with pneumonia Patient was seen by speech recommends regular thin liquid diet makes aspiration less likely Plan: Continue cefepime 2 g every 8 hours IV, no MRSA coverage given MRSA nares negative, follow-up blood culture given MediPort in situ  Chronic pain syndrome Chronic pain syndrome Given the possibility of an acute toxic encephalopathy patient not receiving long-acting MS Contin Symptoms are currently well-controlled on as needed 10 mg or 20 mg of p.o. oxycodone every 4 hours by mouth  Normocytic anemia Anemia Hemoglobin 11.2, stable Will Repeat in AM  Hyponatremia Hyponatremia Sodium 133 today Will repeat in the a.m.  Ambulatory dysfunction Ambulatory dysfunction CT head and neck negative PT and OT consulted  Secondary hypercoagulability disorder (HCC) Secondary hypercoagulable state due to atrial fibrillation Continue Xarelto 20 mg  Bladder cancer (HCC) Bladder cancer On surveillance cystoscopy as outpatient No active urinary symptoms UA was negative   Acute encephalopathy Acute encephalopathy, resolving Patient alert and very conversant Differential diagnosis includes a toxic encephalopathy secondary to the patient's home opiates or metabolic encephalopathy secondary to infection      Subjective: Patient  reports "I am much better", denies any chills, denies any SOB  Physical Exam: Vitals:   01/12/23 1628 01/12/23 2315 01/13/23 0818 01/13/23 1531  BP: (!) 108/53 108/64 (!) 117/59 (!) 107/56  Pulse: 65 68 71 69  Resp: 16 18 16 16   Temp: (!) 97.4 F (36.3 C) 98.7 F (37.1 C) 98.2 F (36.8 C) 98 F (36.7 C)  TempSrc:      SpO2: 100% 95% 96% 97%  Weight:      Height:      Constitutional:  Vital Signs as per Above [facility] than three noted] No Acute Distress, Cachectic MediPort noted without signs of surrounding infection, R Pectoral ENMT:   External Appearance of Ears and Nose without obvious deformity, masses or scar Neck:     Trachea Midline, Neck Symmetric Respiratory:   Respiratory Effort Normal: No Use of Respiratory Muscles,No  Intercostal Retractions             Lungs Clear to Auscultation Bilaterally Cardiovascular:   Heart Auscultated: Regular Regular without any added sounds or murmurs              No Lower Extremity Edema Gastrointestinal:  Abdomen soft and nontender without palpable masses, guarding or rebound  No Palpable Splenomegaly or Hepatomegaly Psychiatric:  Patient Orientated to Time, Place and Person Patient with appropriate mood and affect   Data Reviewed: CXR and Labs as per A/P  Family Communication: N/A  Disposition: Status is: Inpatient Remains inpatient appropriate because: IV Abx  Planned Discharge Destination: Skilled nursing facility    Time spent: 35 minutes  Author: Princess Bruins, MD 01/13/2023 8:05 PM  For on call review [email or web address].

## 2023-01-13 NOTE — Assessment & Plan Note (Signed)
Chronic pain syndrome Given the possibility of an acute toxic encephalopathy patient not receiving long-acting MS Contin Symptoms are currently well-controlled on as needed 10 mg or 20 mg of p.o. oxycodone every 4 hours by mouth

## 2023-01-13 NOTE — Assessment & Plan Note (Signed)
Suspected gram-negative pneumonia In the  chemotherapy patient with multiple healthcare exposures Blood culture pending no growth to date Mycoplasma negative urinalysis negative Chest x-ray notable for superimposed opacities consistent with pneumonia Patient was seen by speech recommends regular thin liquid diet makes aspiration less likely Plan: Continue cefepime 2 g every 8 hours IV, no MRSA coverage given MRSA nares negative, follow-up blood culture given MediPort in situ

## 2023-01-13 NOTE — Progress Notes (Signed)
Nutrition Follow-up  DOCUMENTATION CODES:   Non-severe (moderate) malnutrition in context of chronic illness  INTERVENTION:   Boost Plus chocolate po TID- each supplement provides 360kcal and 14g protein.    Magic cup TID with meals, each supplement provides 290 kcal and 9 grams of protein  MVI po daily   Pt at high refeed risk; recommend monitor potassium, magnesium and phosphorus labs daily until stable  Daily weights   NUTRITION DIAGNOSIS:   Moderate Malnutrition related to chronic illness (esopahgeal cancer) as evidenced by mild fat depletion, moderate fat depletion, mild muscle depletion, moderate muscle depletion. -ongoing   GOAL:   Patient will meet greater than or equal to 90% of their needs -progressing   MONITOR:   PO intake, Supplement acceptance, Labs, Weight trends, Skin, I & O's  ASSESSMENT:   77 y/o male with h/o bladder cancer s/p TURBT, chronic back pain with opiod use, Afib, HTN, HLD, DDD, MDD and esophageal cancer with mucositis on chemotherapy who was admitted with weakness and falls and found to have PNA.  RD working remotely.  Pt with fair appetite and oral intake in hospital; pt documented to be eating 20-90% of meals. Pt ate 50% of his lunch today. Pt has been drinking the Boost [facility] brought in from home by his wife. RD will change Ensure to Boost as pt seems to prefer this. Pt receiving Magic Cups on meal trays. Pt is at high refeed risk; will check refeed labs tomorrow. Per chart, pt is up ~5lbs since admission.    Pt with intermittent poor oral intake and gradual weight loss over the past several years. Pt is down 14lbs(9%) over the past year. Pt has developed moderate malnutrition and is at high risk for developing severe malnutrition. If plan is for additional chemotherapy, would consider IR G-tube placement and nutrition support if pt's oral intake does not improve or if he continues to loose weight. Pt is followed by the RD at the [facility].    Medications reviewed and include: MVI, cefepime, LRS @50ml /hr  Labs reviewed: Na 133(L), K 4.3 wnl  Diet Order:   Diet Order             Diet regular Fluid consistency: Thin  Diet effective now                  EDUCATION NEEDS:   Education needs have been addressed  Skin:  Skin Assessment: Skin Integrity Issues: Skin Integrity Issues:: Other (Comment) Other: skin tear lt elbow  Last BM:  8/17- TYPE 4  Height:   Ht Readings from Last 1 Encounters:  01/12/23 5\' 8"  (1.727 m)    Weight:   Wt Readings from Last 1 Encounters:  01/12/23 64.4 kg    Ideal Body Weight:  70 kg  BMI:  Body mass index is 21.59 kg/m.  Estimated Nutritional Needs:   Kcal:  1900-2200kcal/day  Protein:  95-110 grams  Fluid:  1.7-2.0L/day  Betsey Holiday MS, RD, LDN Please refer to [facility] for RD and/or RD on-call/weekend/after hours pager

## 2023-01-13 NOTE — Evaluation (Signed)
Clinical/Bedside Swallow Evaluation Patient Details  Name: Aaron Short MRN: [id number] Date of Birth: 01/04/1946  Today's Date: 01/13/2023 Time: SLP Start Time (ACUTE ONLY): 4098 SLP Stop Time (ACUTE ONLY): 0940 SLP Time Calculation (min) (ACUTE ONLY): 15 min  Past Medical History:  Past Medical History:  Diagnosis Date   Arthralgia of lower leg 04/02/2012   Arthralgia of upper arm 06/18/2009   Cancer (HCC)    Diabetes mellitus without complication (HCC)    FH: atrial fibrillation    Hypertension    Past Surgical History:  Past Surgical History:  Procedure Laterality Date   bladder cancer surgery  2015   ESOPHAGOGASTRODUODENOSCOPY (EGD) WITH PROPOFOL N/A 10/30/2022   Procedure: ESOPHAGOGASTRODUODENOSCOPY (EGD) WITH PROPOFOL;  Surgeon: Toney Reil, MD;  Location: [facility] ENDOSCOPY;  Service: Gastroenterology;  Laterality: N/A;   IR IMAGING GUIDED PORT INSERTION  11/13/2022   JOINT REPLACEMENT     REPLACEMENT TOTAL HIP W/  RESURFACING IMPLANTS Bilateral    SHOULDER SURGERY Bilateral    TOTAL KNEE ARTHROPLASTY Bilateral    HPI:  Pt is a 77 y/o presenting following multiple falls at home, AMS; admitted with pneumonia. PPMH includes Esophageal adenocarcinoma (HCC) on chemo,  Bladder cancer on surveillance cystoscopy, chronic pain, afib, DM, and HTN,Compression fracture of lumbar vertebra. Known to SLP service with most recent MBSS June 2024 - with recommendation for a mech soft diet with thin liquids and safe swallowing strategies including, but not limited, chin tuck with liquid intake and avoidance of straws. CXR "Chronic lung disease with possible patchy superimposed opacities  which may reflect minimal pneumonia, to include atypical organism."    Assessment / Plan / Recommendation  Clinical Impression  Pt seen for clinical swallowing evaluation. Pt alert, pleasant, and cooperative. Cachetic appearance. Known to SLP from [facility] in June. Pt independently recalled results and  recommendations and MBSS; however, endorsed inconsistent implementation of chin tuck strategy. Pt feels like his swallowing is "about the same" compared to June. Pt with mildly prolonged, but functional, mastication of solids. Oral phase functional across all textures. Pharyngeally, no overt s/sx across all textures including with thin liquids with chin tuck. Min verbal cues needed for implementation of chin tuck across liquid trials and for the duration of the swallow. Recommend a regular diet (with well chopped/moistened solids) and thin liquids with safe swallowing strategies/aspiration precautions as outlined below including, but not limited to, chin tuck with liquid intake, avoidance of straws, alternated bites/sips, and standard aspiration/reflux precautions. SLP to f/u per POC for diet tolerance. Concern for pt's ability to meet nutritional needs. Pt may benefit from RD consult. SLP Visit Diagnosis: Dysphagia, oropharyngeal phase (R13.12)    Aspiration Risk  Mild aspiration risk;Risk for inadequate nutrition/hydration    Diet Recommendation Regular;Thin liquid (well chopped, moistened solids)    Liquid Administration via: No straw Medication Administration:  (as tolerated) Supervision: Patient able to self feed;Full supervision/cueing for compensatory strategies Compensations: Slow rate;Small sips/bites;Chin tuck;Follow solids with liquid Postural Changes: Seated upright at 90 degrees (upright for at least 60 minutes after POs)    Other  Recommendations Recommended Consults:  (RD consult) Oral Care Recommendations: Oral care QID    Recommendations for follow up therapy are one component of a multi-disciplinary discharge planning process, led by the attending physician.  Recommendations may be updated based on patient status, additional functional criteria and insurance authorization.  Follow up Recommendations  (TBD)         Functional Status Assessment Patient has had a recent decline  in their functional status and demonstrates the ability to make significant improvements in function in a reasonable and predictable amount of time.  Frequency and Duration min 2x/week  2 weeks       Prognosis Prognosis for improved oropharyngeal function: Fair Barriers to Reach Goals:  (comorbidities)      Swallow Study   General Date of Onset: 01/11/23 (admit date) HPI: Pt is a 78 y/o presenting following multiple falls at home, AMS; admitted with pneumonia. PPMH includes Esophageal adenocarcinoma (HCC) on chemo,  Bladder cancer on surveillance cystoscopy, chronic pain, afib, DM, and HTN,Compression fracture of lumbar vertebra. Known to SLP service with most recent MBSS June 2024 - with recommendation for a mech soft diet with thin liquids and safe swallowing strategies including, but not limited, chin tuck with liquid intake and avoidance of straws. CXR "Chronic lung disease with possible patchy superimposed opacities  which may reflect minimal pneumonia, to include atypical organism." Type of Study: Bedside Swallow Evaluation Previous Swallow Assessment: MBSS June 2024 - see note for details Diet Prior to this Study: Regular;Thin liquids (Level 0) Temperature Spikes Noted: No Respiratory Status: Room air History of Recent Intubation: No Behavior/Cognition: Alert;Cooperative;Pleasant mood Oral Cavity Assessment: Within Functional Limits Oral Care Completed by SLP: Yes Oral Cavity - Dentition: Poor condition;Missing dentition Vision: Functional for self-feeding Self-Feeding Abilities: Able to feed self Patient Positioning: Upright in bed Baseline Vocal Quality: Low vocal intensity Volitional Cough: Strong Volitional Swallow: Able to elicit    Oral/Motor/Sensory Function Overall Oral Motor/Sensory Function: Generalized oral weakness   Ice Chips Ice chips: Not tested   Thin Liquid Thin Liquid: Within functional limits Presentation: Cup Other Comments: initial cues for use of chin  tuck    Nectar Thick Nectar Thick Liquid: Not tested   Honey Thick Honey Thick Liquid: Not tested   Puree Puree: Within functional limits Presentation: Self Fed   Solid     Solid: Within functional limits Presentation: Self Fed Other Comments: mildly prolonged, but functional, mastication     Clyde Canterbury, M.S., CCC-SLP Speech-Language Pathologist [place] [facility] [phone] (ASCOM)  Alessandra Bevels Aaron Short 01/13/2023,11:10 AM

## 2023-01-13 NOTE — Assessment & Plan Note (Signed)
Hyponatremia Sodium 133 today Will repeat in the a.m.

## 2023-01-13 NOTE — Assessment & Plan Note (Signed)
Bladder cancer On surveillance cystoscopy as outpatient No active urinary symptoms UA was negative

## 2023-01-13 NOTE — TOC Initial Note (Signed)
Transition of Care [facility]) - Initial/Assessment Note    Patient Details  Name: Aaron Short MRN: [id number] Date of Birth: 04-11-1946  [facility]) CM/SW Contact:    Liliana Cline, LCSW Phone Number: 01/13/2023, 11:22 AM  Clinical Narrative:                 Met with patient at bedside. Explained PT rec for SNF. Patient states he is from home with his wife who is supportive. He declines SNF, he would like to go home and resume [facility] services. CSW checked with [name] with [facility] who confirms they are active for HHPT and OT.   Expected Discharge Plan: Home w Home Health Services Barriers to Discharge: Continued Medical Work up   Patient Goals and CMS Choice Patient states their goals for this hospitalization and ongoing recovery are:: would like to resume home health at home CMS Medicare.gov Compare Post Acute Care list provided to:: Patient Choice offered to / list presented to : Patient      Expected Discharge Plan and Services       Living arrangements for the past 2 months: Single Family Home                           HH Arranged: PT, OT HH Agency: [facility] Date [facility] Agency Contacted: 01/13/23   Representative spoke with at [facility] Agency: Laurelyn Sickle  Prior Living Arrangements/Services Living arrangements for the past 2 months: Single Family Home Lives with:: Spouse Patient language and need for interpreter reviewed:: Yes Do you feel safe going back to the place where you live?: Yes      Need for Family Participation in Patient Care: Yes (Comment) Care giver support system in place?: Yes (comment) Current home services: DME, Home PT, Home OT Criminal Activity/Legal Involvement Pertinent to Current Situation/Hospitalization: No - Comment as needed  Activities of Daily Living Home Assistive Devices/Equipment: Walker (specify type) ADL Screening (condition at time of admission) Patient's cognitive ability adequate to safely complete daily  activities?: No Is the patient deaf or have difficulty hearing?: Yes Does the patient have difficulty seeing, even when wearing glasses/contacts?: Yes Does the patient have difficulty concentrating, remembering, or making decisions?: Yes Patient able to express need for assistance with ADLs?: Yes Does the patient have difficulty dressing or bathing?: Yes Independently performs ADLs?: No Communication: Independent Dressing (OT): Needs assistance Is this a change from baseline?: Pre-admission baseline Grooming: Needs assistance Is this a change from baseline?: Pre-admission baseline Feeding: Independent Bathing: Needs assistance Is this a change from baseline?: Pre-admission baseline Toileting: Needs assistance Is this a change from baseline?: Pre-admission baseline In/Out Bed: Needs assistance Is this a change from baseline?: Pre-admission baseline Walks in Home: Needs assistance Is this a change from baseline?: Pre-admission baseline Does the patient have difficulty walking or climbing stairs?: Yes Weakness of Legs: Both Weakness of Arms/Hands: Both  Permission Sought/Granted Permission sought to share information with : Facility Medical sales representative, Family Supports Permission granted to share information with : Yes, Verbal Permission Granted              Emotional Assessment       Orientation: : Oriented to Self, Oriented to Place, Oriented to  Time, Oriented to Situation Alcohol / Substance Use: Not Applicable Psych Involvement: No (comment)  Admission diagnosis:  Pneumonia [J18.9] Acute encephalopathy [G93.40] Patient Active Problem List   Diagnosis Date Noted   Frequent falls 01/12/2023  At high risk for infection due to chemotherapy 01/12/2023   Acute metabolic encephalopathy 01/12/2023   Malnutrition of moderate degree 01/12/2023   Chemotherapy-induced nausea 01/10/2023   Mucositis 01/10/2023   Closed compression fracture of body of L1 vertebra (HCC)  01/02/2023   Compression fracture of lumbar vertebra (HCC) 12/13/2022   Closed fracture of lumbar vertebral body (HCC) 12/04/2022   Traumatic rhabdomyolysis (HCC) 12/04/2022   Atrial fibrillation, chronic (HCC) 12/04/2022   Dysuria 11/29/2022   Chemotherapy induced diarrhea 11/22/2022   Encounter for antineoplastic chemotherapy 11/15/2022   Esophageal adenocarcinoma (HCC) 11/03/2022   Goals of care, counseling/discussion 11/03/2022   Protein calorie malnutrition (HCC) 11/03/2022   Esophageal mass 10/30/2022   Intercostal neuralgia 10/12/2022   Multiple closed fractures of ribs of left side 10/12/2022   Abnormal drug screen 10/05/2022   Tick bite 10/05/2022   Rib pain on left side 10/05/2022   Dysphagia 09/21/2022   Melena 03/23/2022   Pneumonia 03/20/2022   Osteoporosis 05/02/2021   Weight loss 12/29/2020   Adenocarcinoma of esophagus, stage 4 (HCC) 12/29/2020   Nightmares associated with chronic post-traumatic stress disorder 12/09/2019   Current moderate episode of major depressive disorder without prior episode (HCC) 11/14/2019   Risk for falls 07/10/2019   T12 compression fracture, sequela 07/09/2019   Chronic shoulder pain (Left) 07/09/2019   Chronic left-sided thoracic back pain 03/10/2019   Acute pain of left shoulder 03/10/2019   Acute midline thoracic back pain 03/10/2019   Pharmacologic therapy 12/31/2018   Disorder of skeletal system 12/31/2018   Problems influencing health status 12/31/2018   DDD (degenerative disc disease), cervical 12/31/2018   DDD (degenerative disc disease), lumbosacral 12/31/2018   Lumbosacral Grade 1 Retrolisthesis of L5/S1 12/31/2018   Neurogenic pain 12/31/2018   Chronic hip pain after THR (Bilateral) 12/31/2018   Strain of lumbar region 10/15/2017   Chronic wound of head 07/02/2017   Hematoma of leg, left, initial encounter 03/30/2017   Need for influenza vaccination 02/26/2017   Chronic acromioclavicular joint pain (Left) 09/05/2016    Osteoarthritis of shoulder (Bilateral) (L>R) 09/05/2016   Acromioclavicular arthrosis (Bilateral) (L>R) 09/05/2016   Arthralgia of acromioclavicular joint (Bilateral) (L>R) 09/05/2016   Obesity, Class I, BMI 30-34.9 05/11/2016   Chronic pain syndrome 05/11/2016   FH: atrial fibrillation    Smoker 01/07/2016   Disturbance of skin sensation 11/17/2015   Lumbar facet syndrome (Bilateral) (R>L) 08/16/2015   Lumbar spondylosis 08/16/2015   Chronic, continuous use of opioids 07/05/2015   Neoplasm related pain 05/18/2015   Long term prescription opiate use 05/18/2015   Opiate use (30 MME/Day) 05/18/2015   Encounter for therapeutic drug level monitoring 05/18/2015   Encounter for chronic pain management 05/18/2015   Opioid dependence, daily use (HCC) 05/18/2015   Chronic hip pain (Secondary area of Pain) (Bilateral) (R>L) 05/18/2015   Chronic shoulder pain (Primary Area of Pain) (Bilateral) (L>R) 05/18/2015   Chronic low back pain (Third area of Pain) (Bilateral) (R>L) 05/18/2015   Chronic knee pain (Bilateral) (R>L) 05/18/2015   S/P THR: total hip replacement (Bilateral) 05/18/2015   S/P TKR: total knee replacement (Bilateral) 05/18/2015   History of bladder cancer 05/18/2015   History of hip fracture 05/18/2015   History of pelvic fracture 05/18/2015   Long-term use of high-risk medication 05/18/2015   Chronic neck pain 05/18/2015   Chondrocalcinosis 05/18/2015   Chronic shoulder pain (Radicular) 05/18/2015   History of shoulder surgery 05/18/2015   Myofascial pain 05/18/2015   Chronic musculoskeletal pain 05/18/2015   Muscle  spasm 05/18/2015   Pure hypercholesterolemia 12/31/2014   Essential hypertension 12/31/2014   Muscle spasticity 07/30/2014   Involuntary muscle contractions 07/30/2014   Malignant neoplasm of urinary bladder (HCC) 10/23/2013   Malignant neoplasm of bladder (HCC) 10/23/2013   Malignant neoplasm of overlapping sites of bladder (HCC) 10/23/2013   Chronic back  pain greater than 3 months duration 09/16/2013   Lower urinary tract infectious disease 09/16/2013   Atrial fibrillation and flutter (HCC) 01/14/2013   Mixed hyperlipidemia 06/18/2009   Hypertension, benign 06/18/2009   Impaired fasting glucose 06/18/2009   PCP:  Rosemarie Ax, MD Pharmacy:   [facility] [phone] [facility], [place] - [address] AT [facility] OF [address] [place] [address] [place] [address] Phone: [phone] Fax: [phone]  [facility] #[id number] [place], [place] - [address] AT [facility] OF [address] [place] [address] [place] [address] Phone: [phone] Fax: [phone]  [facility] [address], [place] - [address] [place] [address] Phone: [phone] Fax: [phone]     Social Determinants of Health (SDOH) Social History: SDOH Screenings   Food Insecurity: No Food Insecurity (01/12/2023)  Housing: Low Risk  (01/12/2023)  Transportation Needs: No Transportation Needs (01/12/2023)  Utilities: Not At Risk (01/12/2023)  Depression (PHQ2-9): Low Risk  (10/12/2022)  Financial Resource Strain: Low Risk  (11/03/2022)  Physical Activity: Sufficiently Active (02/16/2021)   Received from [facility], [facility]  Social Connections: Moderately Integrated (02/16/2021)   Received from [facility], [facility]  Stress: No Stress Concern Present (11/03/2022)  Tobacco Use: High Risk (01/12/2023)  Health Literacy: Low Risk  (05/02/2021)   Received from [facility], [facility]   SDOH Interventions:     Readmission Risk Interventions     No data to display

## 2023-01-13 NOTE — Plan of Care (Signed)

## 2023-01-13 NOTE — Progress Notes (Signed)
OT Cancellation Note  Patient Details Name: Aaron Short MRN: [id number] DOB: 1946/04/05   Cancelled Treatment:    Reason Eval/Treat Not Completed: Fatigue/lethargy limiting ability to participate;Other (comment) (Pt stating he's fatigued from being up twice to bathroom today. OT discussed urine management systems for home with wife, as wife states that pt gets up a lot at night to urinate, interfering with sleep.)  Alvester Morin 01/13/2023, 3:17 PM

## 2023-01-14 DIAGNOSIS — G9340 Encephalopathy, unspecified: Secondary | ICD-10-CM | POA: Diagnosis not present

## 2023-01-14 LAB — CBC
HCT: 31.4 % — ABNORMAL LOW (ref 39.0–52.0)
Hemoglobin: 10.9 g/dL — ABNORMAL LOW (ref 13.0–17.0)
MCH: 33.3 pg (ref 26.0–34.0)
MCHC: 34.7 g/dL (ref 30.0–36.0)
MCV: 96 fL (ref 80.0–100.0)
Platelets: 235 10*3/uL (ref 150–400)
RBC: 3.27 MIL/uL — ABNORMAL LOW (ref 4.22–5.81)
RDW: 17.1 % — ABNORMAL HIGH (ref 11.5–15.5)
WBC: 6.2 10*3/uL (ref 4.0–10.5)
nRBC: 0 % (ref 0.0–0.2)

## 2023-01-14 LAB — BASIC METABOLIC PANEL
Anion gap: 7 (ref 5–15)
BUN: 21 mg/dL (ref 8–23)
CO2: 27 mmol/L (ref 22–32)
Calcium: 8.7 mg/dL — ABNORMAL LOW (ref 8.9–10.3)
Chloride: 101 mmol/L (ref 98–111)
Creatinine, Ser: 0.6 mg/dL — ABNORMAL LOW (ref 0.61–1.24)
GFR, Estimated: >60 mL/min (ref >60)
Glucose, Bld: 105 mg/dL — ABNORMAL HIGH (ref 70–99)
Potassium: 4.4 mmol/L (ref 3.5–5.1)
Sodium: 135 mmol/L (ref 135–145)

## 2023-01-14 LAB — MAGNESIUM: Magnesium: 2.1 mg/dL (ref 1.7–2.4)

## 2023-01-14 LAB — PHOSPHORUS: Phosphorus: 2.4 mg/dL — ABNORMAL LOW (ref 2.5–4.6)

## 2023-01-14 MED ORDER — CEFDINIR 300 MG PO CAPS: 300.0000 mg | ORAL_CAPSULE | Freq: Two times a day (BID) | ORAL | 0 refills | Status: AC

## 2023-01-14 NOTE — TOC Transition Note (Signed)
Transition of Care [facility]) - CM/SW Discharge Note   Patient Details  Name: Aaron Short MRN: [id number] Date of Birth: 03/04/1946  Transition of Care [facility]) CM/SW Contact:  Liliana Cline, LCSW Phone Number: 01/14/2023, 9:59 AM   Clinical Narrative:    Patient has orders to DC home today.  CSW notified Laurelyn Sickle with [facility], they will follow for PT and OT services. Asked MD for Home Health resumption orders.    Final next level of care: Home w Home Health Services Barriers to Discharge: Barriers Resolved   Patient Goals and CMS Choice CMS Medicare.gov Compare Post Acute Care list provided to:: Patient Choice offered to / list presented to : Patient  Discharge Placement                         Discharge Plan and Services Additional resources added to the After Visit Summary for                            [facility] Arranged: PT, OT [facility] Agency: [facility] Date [facility] Agency Contacted: 01/14/23   Representative spoke with at [facility] Agency: Laurelyn Sickle  Social Determinants of Health (SDOH) Interventions SDOH Screenings   Food Insecurity: No Food Insecurity (01/12/2023)  Housing: Low Risk  (01/12/2023)  Transportation Needs: No Transportation Needs (01/12/2023)  Utilities: Not At Risk (01/12/2023)  Depression (PHQ2-9): Low Risk  (10/12/2022)  Financial Resource Strain: Low Risk  (11/03/2022)  Physical Activity: Sufficiently Active (02/16/2021)   Received from [facility], [facility]  Social Connections: Moderately Integrated (02/16/2021)   Received from [facility], [facility]  Stress: No Stress Concern Present (11/03/2022)  Tobacco Use: High Risk (01/12/2023)  Health Literacy: Low Risk  (05/02/2021)   Received from [facility], [facility]     Readmission Risk Interventions     No data to display

## 2023-01-14 NOTE — Discharge Summary (Signed)
Physician Discharge Summary   Patient: Aaron Short MRN: [id number] DOB: [birth year]  Admit date:     01/11/2023  Discharge date: 01/14/23  Discharge Physician: Loyce Dys   PCP: Rosemarie Ax, MD   Recommendations at discharge:  Regular thin liquid diet  Discharge Diagnoses: Community-acquired pneumonia Chronic pain syndrome Normocytic anemia Hyponatremia Ambulatory dysfunction Secondary hypercoagulability disorder [facility]) Bladder cancer [facility]) Acute encephalopathy  Hospital Course: 77 year old gentleman with a past medical history of both bladder cancer as well as esophageal cancer presents with a fall and encephalopathy subsequently diagnosed with pneumonia .  Patient was managed with IV cefepime and tolerated it with no issues.  Encephalopathy resolved currently patient is alert and oriented able to walk about with no issue.  Patient has therefore been cleared to complete a course of oral antibiotics.  Consultants: None Procedures performed: None Disposition: Home Diet recommendation:  Discharge Diet Orders (From admission, onward)     Start     Ordered   01/14/23 0000  Diet - low sodium heart healthy        01/14/23 0946           Regular diet with thin liquids DISCHARGE MEDICATION: Allergies as of 01/14/2023       Reactions   Diltiazem Swelling   Leg edema Leg edema Leg edema Leg edema Leg edema Leg edema Leg edema Leg edema Leg edema   Penicillins Hives, Rash   Had hives as child Other reaction(s): RASH Childhood allergy Other reaction(s): RASH Other reaction(s): RASH Childhood allergy Other reaction(s): RASH Other reaction(s): RASH Childhood allergy   Tizanidine Other (See Comments)   Other reaction(s): OTHER  Pt states it puts him out Other reaction(s): OTHER  Pt states it puts him out Other reaction(s): OTHER  Pt states it puts him out Other reaction(s): OTHER  Pt states it puts him out Other reaction(s): OTHER  Pt states it puts  him out Other reaction(s): OTHER  Pt states it puts him out        Medication List     STOP taking these medications    Cetirizine HCl 10 MG Caps       TAKE these medications    carisoprodol 350 MG tablet Commonly known as: SOMA Take 350 mg by mouth 2 (two) times daily.   cefdinir 300 MG capsule Commonly known as: OMNICEF Take 1 capsule (300 mg total) by mouth 2 (two) times daily for 3 days.   dexamethasone 4 MG tablet Commonly known as: DECADRON Take 2 tablets (8 mg total) by mouth daily. Start the day after chemotherapy for 2 days. Take with food.   lidocaine-prilocaine cream Commonly known as: EMLA Apply to affected area once   loperamide 2 MG capsule Commonly known as: IMODIUM Take 1 capsule (2 mg total) by mouth See admin instructions. Initial: 4 mg,the 2 mg every 2 hours (4 mg every 4 hours at night)  maximum: 16 mg/day   LORazepam 0.5 MG tablet Commonly known as: ATIVAN Take 1 tablet (0.5 mg total) by mouth every 8 (eight) hours as needed (nausea).   magic mouthwash w/lidocaine Soln Take 5 mLs by mouth 4 (four) times daily as needed for mouth pain. Sig: Swish/Swallow 5-10 ml four times a day as needed. Dispense 480 ml. 1RF   Magnesium 500 MG Tabs Take 500 mg by mouth as needed.   megestrol 20 MG tablet Commonly known as: MEGACE Take 1 tablet (20 mg total) by mouth 2 (two) times daily.   Melatonin Maximum  Strength 5 MG Tabs Generic drug: melatonin Take 5 mg by mouth at bedtime as needed.   naloxone 4 MG/0.1ML Liqd nasal spray kit Commonly known as: Narcan Place 1 spray into the nose once for 1 dose. Spray half of bottle content into each nostril, then call 911   omeprazole 10 MG capsule Commonly known as: PRILOSEC Take 20 mg by mouth daily.   ondansetron 8 MG tablet Commonly known as: Zofran Take 1 tablet (8 mg total) by mouth every 8 (eight) hours as needed for nausea or vomiting. Start on the third day after chemotherapy.   Oxycodone HCl 10  MG Tabs Take 1-2 tablets (10-20 mg total) by mouth every 4 (four) hours as needed (pain).   prazosin 1 MG capsule Commonly known as: MINIPRESS Take 2 mg by mouth at bedtime.   prochlorperazine 10 MG tablet Commonly known as: COMPAZINE Take 1 tablet (10 mg total) by mouth every 6 (six) hours as needed for nausea or vomiting.   rivaroxaban 20 MG Tabs tablet Commonly known as: XARELTO Take 20 mg by mouth daily.   sertraline 50 MG tablet Commonly known as: ZOLOFT Take 100 mg by mouth at bedtime.   Vitamin B + C Complex Tabs Take 1 capsule by mouth every morning.               Discharge Care Instructions  (From admission, onward)           Start     Ordered   01/14/23 0000  No dressing needed        01/14/23 0946            Discharge Exam: Filed Weights   01/11/23 2205 01/12/23 0400  Weight: 61.7 kg 64.4 kg   General: Not in acute distress elderly male not on oxygen Respiratory: Clear to auscultation Cardiovascular: S1-S2 normal Gastrointestinal: Soft nontender Psychiatric: Alert and oriented x 3 Condition at discharge: good    Discharge time spent:  35 minutes.  Signed: Loyce Dys, MD [facility] 01/14/2023

## 2023-01-14 NOTE — Plan of Care (Signed)
Problem: Activity: Goal: Ability to tolerate increased activity will improve 01/14/2023 1016 by Suezanne Cheshire, RN Outcome: Adequate for Discharge 01/14/2023 1016 by Suezanne Cheshire, RN Outcome: Progressing   Problem: Clinical Measurements: Goal: Ability to maintain a body temperature in the normal range will improve 01/14/2023 1016 by Suezanne Cheshire, RN Outcome: Adequate for Discharge 01/14/2023 1016 by Suezanne Cheshire, RN Outcome: Progressing   Problem: Respiratory: Goal: Ability to maintain adequate ventilation will improve 01/14/2023 1016 by Suezanne Cheshire, RN Outcome: Adequate for Discharge 01/14/2023 1016 by Suezanne Cheshire, RN Outcome: Progressing Goal: Ability to maintain a clear airway will improve 01/14/2023 1016 by Suezanne Cheshire, RN Outcome: Adequate for Discharge 01/14/2023 1016 by Suezanne Cheshire, RN Outcome: Progressing   Problem: Education: Goal: Knowledge of General Education information will improve Description: Including pain rating scale, medication(s)/side effects and non-pharmacologic comfort measures 01/14/2023 1016 by Suezanne Cheshire, RN Outcome: Adequate for Discharge 01/14/2023 1016 by Suezanne Cheshire, RN Outcome: Progressing   Problem: Health Behavior/Discharge Planning: Goal: Ability to manage health-related needs will improve 01/14/2023 1016 by Suezanne Cheshire, RN Outcome: Adequate for Discharge 01/14/2023 1016 by Suezanne Cheshire, RN Outcome: Progressing   Problem: Clinical Measurements: Goal: Ability to maintain clinical measurements within normal limits will improve 01/14/2023 1016 by Suezanne Cheshire, RN Outcome: Adequate for Discharge 01/14/2023 1016 by Suezanne Cheshire, RN Outcome: Progressing Goal: Will remain free from infection 01/14/2023 1016 by Suezanne Cheshire, RN Outcome: Adequate for Discharge 01/14/2023 1016 by Suezanne Cheshire, RN Outcome: Progressing Goal: Diagnostic test results will  improve 01/14/2023 1016 by Suezanne Cheshire, RN Outcome: Adequate for Discharge 01/14/2023 1016 by Suezanne Cheshire, RN Outcome: Progressing Goal: Respiratory complications will improve 01/14/2023 1016 by Suezanne Cheshire, RN Outcome: Adequate for Discharge 01/14/2023 1016 by Suezanne Cheshire, RN Outcome: Progressing Goal: Cardiovascular complication will be avoided 01/14/2023 1016 by Suezanne Cheshire, RN Outcome: Adequate for Discharge 01/14/2023 1016 by Suezanne Cheshire, RN Outcome: Progressing   Problem: Activity: Goal: Risk for activity intolerance will decrease 01/14/2023 1016 by Suezanne Cheshire, RN Outcome: Adequate for Discharge 01/14/2023 1016 by Suezanne Cheshire, RN Outcome: Progressing   Problem: Nutrition: Goal: Adequate nutrition will be maintained 01/14/2023 1016 by Suezanne Cheshire, RN Outcome: Adequate for Discharge 01/14/2023 1016 by Suezanne Cheshire, RN Outcome: Progressing   Problem: Coping: Goal: Level of anxiety will decrease 01/14/2023 1016 by Suezanne Cheshire, RN Outcome: Adequate for Discharge 01/14/2023 1016 by Suezanne Cheshire, RN Outcome: Progressing   Problem: Elimination: Goal: Will not experience complications related to bowel motility 01/14/2023 1016 by Suezanne Cheshire, RN Outcome: Adequate for Discharge 01/14/2023 1016 by Suezanne Cheshire, RN Outcome: Progressing Goal: Will not experience complications related to urinary retention 01/14/2023 1016 by Suezanne Cheshire, RN Outcome: Adequate for Discharge 01/14/2023 1016 by Suezanne Cheshire, RN Outcome: Progressing   Problem: Pain Managment: Goal: General experience of comfort will improve 01/14/2023 1016 by Suezanne Cheshire, RN Outcome: Adequate for Discharge 01/14/2023 1016 by Suezanne Cheshire, RN Outcome: Progressing   Problem: Safety: Goal: Ability to remain free from injury will improve 01/14/2023 1016 by Suezanne Cheshire, RN Outcome: Adequate for  Discharge 01/14/2023 1016 by Suezanne Cheshire, RN Outcome: Progressing   Problem: Skin Integrity: Goal: Risk for impaired skin integrity will decrease 01/14/2023 1016 by Suezanne Cheshire, RN Outcome: Adequate for Discharge 01/14/2023 1016 by Suezanne Cheshire, RN Outcome: Progressing

## 2023-01-15 ENCOUNTER — Other Ambulatory Visit: Payer: Self-pay

## 2023-01-15 LAB — LEGIONELLA PNEUMOPHILA SEROGP 1 UR AG: L. pneumophila Serogp 1 Ur Ag: NEGATIVE

## 2023-01-17 LAB — CULTURE, BLOOD (ROUTINE X 2)
Culture: NO GROWTH
Culture: NO GROWTH
Special Requests: ADEQUATE
Special Requests: ADEQUATE

## 2023-01-18 ENCOUNTER — Encounter: Payer: Self-pay | Admitting: Oncology

## 2023-01-19 ENCOUNTER — Ambulatory Visit: Payer: Medicare Other | Admitting: Physician Assistant

## 2023-01-22 ENCOUNTER — Other Ambulatory Visit: Payer: Self-pay | Admitting: *Deleted

## 2023-01-22 MED ORDER — OXYCODONE HCL 10 MG PO TABS: 10.0000 mg | ORAL_TABLET | ORAL | 0 refills | Status: DC | PRN

## 2023-01-22 NOTE — Telephone Encounter (Signed)
Patient called requesting a return call from Kathyrn Drown, NP, when I asked what he needed to speak to him about, he reports that he needs a refill of his Oxycodone and he wanted Josh to know that he fell twice Thursday and sten spent 2 days in the hospital.

## 2023-01-23 MED FILL — Dexamethasone Sodium Phosphate Inj 100 MG/10ML: INTRAMUSCULAR | Qty: 1 | Status: AC

## 2023-01-24 ENCOUNTER — Inpatient Hospital Stay: Payer: Medicare Other

## 2023-01-24 ENCOUNTER — Telehealth: Payer: Self-pay

## 2023-01-24 ENCOUNTER — Encounter: Payer: Self-pay | Admitting: Oncology

## 2023-01-24 ENCOUNTER — Inpatient Hospital Stay (HOSPITAL_BASED_OUTPATIENT_CLINIC_OR_DEPARTMENT_OTHER): Payer: Medicare Other | Admitting: Oncology

## 2023-01-24 VITALS — BP 118/64 | HR 76 | Temp 96.4°F | Resp 18 | Wt 127.5 lb

## 2023-01-24 DIAGNOSIS — E46 Unspecified protein-calorie malnutrition: Secondary | ICD-10-CM

## 2023-01-24 DIAGNOSIS — R110 Nausea: Secondary | ICD-10-CM

## 2023-01-24 DIAGNOSIS — Z79891 Long term (current) use of opiate analgesic: Secondary | ICD-10-CM

## 2023-01-24 DIAGNOSIS — T451X5D Adverse effect of antineoplastic and immunosuppressive drugs, subsequent encounter: Secondary | ICD-10-CM

## 2023-01-24 DIAGNOSIS — Z7901 Long term (current) use of anticoagulants: Secondary | ICD-10-CM

## 2023-01-24 DIAGNOSIS — F17210 Nicotine dependence, cigarettes, uncomplicated: Secondary | ICD-10-CM

## 2023-01-24 DIAGNOSIS — Z9181 History of falling: Secondary | ICD-10-CM

## 2023-01-24 DIAGNOSIS — G893 Neoplasm related pain (acute) (chronic): Secondary | ICD-10-CM

## 2023-01-24 DIAGNOSIS — Z5112 Encounter for antineoplastic immunotherapy: Secondary | ICD-10-CM | POA: Diagnosis not present

## 2023-01-24 DIAGNOSIS — C159 Malignant neoplasm of esophagus, unspecified: Secondary | ICD-10-CM | POA: Diagnosis not present

## 2023-01-24 DIAGNOSIS — K1230 Oral mucositis (ulcerative), unspecified: Secondary | ICD-10-CM

## 2023-01-24 DIAGNOSIS — C787 Secondary malignant neoplasm of liver and intrahepatic bile duct: Secondary | ICD-10-CM

## 2023-01-24 DIAGNOSIS — S32000D Wedge compression fracture of unspecified lumbar vertebra, subsequent encounter for fracture with routine healing: Secondary | ICD-10-CM

## 2023-01-24 DIAGNOSIS — I4891 Unspecified atrial fibrillation: Secondary | ICD-10-CM

## 2023-01-24 DIAGNOSIS — K1231 Oral mucositis (ulcerative) due to antineoplastic therapy: Secondary | ICD-10-CM

## 2023-01-24 DIAGNOSIS — R300 Dysuria: Secondary | ICD-10-CM

## 2023-01-24 LAB — CBC WITH DIFFERENTIAL (CANCER CENTER ONLY)
Abs Immature Granulocytes: 0.05 10*3/uL (ref 0.00–0.07)
Basophils Absolute: 0.1 10*3/uL (ref 0.0–0.1)
Basophils Relative: 1 %
Eosinophils Absolute: 0.1 10*3/uL (ref 0.0–0.5)
Eosinophils Relative: 1 %
HCT: 32.4 % — ABNORMAL LOW (ref 39.0–52.0)
Hemoglobin: 11 g/dL — ABNORMAL LOW (ref 13.0–17.0)
Immature Granulocytes: 1 %
Lymphocytes Relative: 23 %
Lymphs Abs: 1.9 10*3/uL (ref 0.7–4.0)
MCH: 33.8 pg (ref 26.0–34.0)
MCHC: 34 g/dL (ref 30.0–36.0)
MCV: 99.7 fL (ref 80.0–100.0)
Monocytes Absolute: 1.2 10*3/uL — ABNORMAL HIGH (ref 0.1–1.0)
Monocytes Relative: 15 %
Neutro Abs: 5 10*3/uL (ref 1.7–7.7)
Neutrophils Relative %: 59 %
Platelet Count: 200 10*3/uL (ref 150–400)
RBC: 3.25 MIL/uL — ABNORMAL LOW (ref 4.22–5.81)
RDW: 17.6 % — ABNORMAL HIGH (ref 11.5–15.5)
WBC Count: 8.4 10*3/uL (ref 4.0–10.5)
nRBC: 0 % (ref 0.0–0.2)

## 2023-01-24 LAB — CMP (CANCER CENTER ONLY)
ALT: 20 U/L (ref 0–44)
AST: 34 U/L (ref 15–41)
Albumin: 3.2 g/dL — ABNORMAL LOW (ref 3.5–5.0)
Alkaline Phosphatase: 117 U/L (ref 38–126)
Anion gap: 7 (ref 5–15)
BUN: 19 mg/dL (ref 8–23)
CO2: 26 mmol/L (ref 22–32)
Calcium: 8.5 mg/dL — ABNORMAL LOW (ref 8.9–10.3)
Chloride: 99 mmol/L (ref 98–111)
Creatinine: 0.62 mg/dL (ref 0.61–1.24)
GFR, Estimated: >60 mL/min (ref >60)
Glucose, Bld: 144 mg/dL — ABNORMAL HIGH (ref 70–99)
Potassium: 3.7 mmol/L (ref 3.5–5.1)
Sodium: 132 mmol/L — ABNORMAL LOW (ref 135–145)
Total Bilirubin: 0.6 mg/dL (ref 0.3–1.2)
Total Protein: 6.3 g/dL — ABNORMAL LOW (ref 6.5–8.1)

## 2023-01-24 MED ORDER — FLUOROURACIL CHEMO INJECTION 2.5 GM/50ML
400.0000 mg/m2 | Freq: Once | INTRAVENOUS | Status: AC
  Administered 2023-01-24: 650 mg via INTRAVENOUS
  Filled 2023-01-24: qty 13

## 2023-01-24 MED ORDER — SODIUM CHLORIDE 0.9 % IV SOLN
2400.0000 mg/m2 | INTRAVENOUS | Status: DC
  Administered 2023-01-24: 4000 mg via INTRAVENOUS
  Filled 2023-01-24: qty 80

## 2023-01-24 MED ORDER — SODIUM CHLORIDE 0.9 % IV SOLN
400.0000 mg/m2 | Freq: Once | INTRAVENOUS | Status: AC
  Administered 2023-01-24: 692 mg via INTRAVENOUS
  Filled 2023-01-24 (×2): qty 34.6

## 2023-01-24 MED ORDER — LEUCOVORIN CALCIUM INJECTION 350 MG
400.0000 mg/m2 | Freq: Once | INTRAMUSCULAR | Status: DC
Start: 2023-01-24 — End: 2023-01-24

## 2023-01-24 MED ORDER — PALONOSETRON HCL INJECTION 0.25 MG/5ML: 0.2500 mg | Freq: Once | INTRAVENOUS | Status: DC

## 2023-01-24 MED ORDER — ACETAMINOPHEN 325 MG PO TABS
650.0000 mg | ORAL_TABLET | Freq: Once | ORAL | Status: AC
  Administered 2023-01-24: 650 mg via ORAL
  Filled 2023-01-24: qty 2

## 2023-01-24 MED ORDER — DIPHENHYDRAMINE HCL 25 MG PO CAPS
50.0000 mg | ORAL_CAPSULE | Freq: Once | ORAL | Status: AC
  Administered 2023-01-24: 50 mg via ORAL
  Filled 2023-01-24: qty 2

## 2023-01-24 MED ORDER — SODIUM CHLORIDE 0.9 % IV SOLN
Freq: Once | INTRAVENOUS | Status: AC
  Filled 2023-01-24: qty 250

## 2023-01-24 MED ORDER — LEUCOVORIN CALCIUM INJECTION 350 MG: 400.0000 mg/m2 | Freq: Once | INTRAVENOUS | Status: DC

## 2023-01-24 MED ORDER — SODIUM CHLORIDE 0.9 % IV SOLN
10.0000 mg | Freq: Once | INTRAVENOUS | Status: AC
  Administered 2023-01-24: 10 mg via INTRAVENOUS
  Filled 2023-01-24: qty 10

## 2023-01-24 MED ORDER — TRASTUZUMAB-ANNS CHEMO 150 MG IV SOLR
4.0000 mg/kg | Freq: Once | INTRAVENOUS | Status: AC
  Administered 2023-01-24: 231 mg via INTRAVENOUS
  Filled 2023-01-24: qty 11

## 2023-01-24 NOTE — Assessment & Plan Note (Signed)
 Magic mouth wash swish and spit.

## 2023-01-24 NOTE — Assessment & Plan Note (Signed)
 Antiemetics PRN Recommend trials of Ativan 0.5mg  Q8hours PRN nausea.  Rationale and side effects were reviewed with patient.

## 2023-01-24 NOTE — Assessment & Plan Note (Signed)
 Follow-up with nutritionist Continue nutrition supplements.  Recommend him to hold off Statin.  Continue Megace 20mg  Bid.

## 2023-01-24 NOTE — Assessment & Plan Note (Signed)
He needs to establish care with home PT/OT. Missed initial assessment.  Will also need home wound care.

## 2023-01-24 NOTE — Assessment & Plan Note (Signed)
Continue follow-up with palliative care service. continue oxycodone 10-20mg  Q 4-6 hours PRN.  MS contin 15mg  BID

## 2023-01-24 NOTE — Assessment & Plan Note (Addendum)
Image findings and pathology results are reviewed with patient and wife.  PET scan results were reviewed and discussed with patient.  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  NGS positive for HER2 copy again. Labs are reviewed and discussed with patient. Proceed with 5-FU  and Transtuzumab., hold off oxaliplatin for this and next cycle due to decreased PS Repeat CT next week

## 2023-01-24 NOTE — Assessment & Plan Note (Signed)
 Follow up with neurosurgeon.  Recommend calcium and Vitamin D Consider bone strengthen agent.  Recommend patient to obtain Dental clearance for zometa.

## 2023-01-24 NOTE — Telephone Encounter (Signed)
Contacted [facility] to follow up on hospital [facility] PT/OT order, per wife request. Spoke with Tobi Bastos who said that they are waiting for insurance approval and Doctor siganture to set up services. Also faxed order to [facility] for bilateral elbows  [facility]: ph: [phone] Fax: [phone]

## 2023-01-24 NOTE — Progress Notes (Signed)
Hematology/Oncology Progress note [phone] [phone]        REFERRING PROVIDER: Rickard Patience, MD    CHIEF COMPLAINTS/PURPOSE OF CONSULTATION:  Esophageal cancer.   ASSESSMENT & PLAN:   Cancer Staging  Esophageal adenocarcinoma [facility]) Staging form: Esophagus - Adenocarcinoma, AJCC 8th Edition - Clinical stage from 11/03/2022: Stage IVB (cTX, cNX, cM1) - Signed by Rickard Patience, MD on 11/03/2022   Esophageal adenocarcinoma [facility]) Image findings and pathology results are reviewed with patient and wife.  PET scan results were reviewed and discussed with patient.  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  NGS positive for HER2 copy again. Labs are reviewed and discussed with patient. Proceed with 5-FU  and Transtuzumab., hold off oxaliplatin for this and next cycle due to decreased PS Repeat CT next week  Neoplasm related pain Continue follow-up with palliative care service. continue oxycodone 10-20mg  Q 4-6 hours PRN.  MS contin 15mg  BID   Protein calorie malnutrition (HCC) Follow-up with nutritionist Continue nutrition supplements.  Recommend him to hold off Statin.  Continue Megace 20mg  Bid.    Compression fracture of lumbar vertebra (HCC) Follow up with neurosurgeon.  Recommend calcium and Vitamin D Consider bone strengthen agent.  Recommend patient to obtain Dental clearance for zometa.   Chemotherapy-induced nausea Antiemetics PRN Recommend trials of Ativan 0.5mg  Q8hours PRN nausea.  Rationale and side effects were reviewed with patient.    Mucositis Magic mouth wash swish and spit.   Risk for falls He needs to establish care with home PT/OT. Missed initial assessment.  Will also need home wound care.    Orders Placed This Encounter  Procedures   CT CHEST ABDOMEN PELVIS W CONTRAST    Standing Status:   Future    Standing Expiration Date:   01/24/2024    Order Specific Question:   If indicated for  the ordered procedure, I authorize the administration of contrast media per Radiology protocol    Answer:   Yes    Order Specific Question:   Does the patient have a contrast media/X-ray dye allergy?    Answer:   No    Order Specific Question:   Preferred imaging location?    Answer:   [facility]    Order Specific Question:   If indicated for the ordered procedure, I authorize the administration of oral contrast media per Radiology protocol    Answer:   Yes     Follow-up in 2 weeks for lab MD 5-FU and trastuzumab. All questions were answered. The patient knows to call the clinic with any problems, questions or concerns.  Rickard Patience, MD, PhD [facility] Hematology Oncology 01/24/2023    HISTORY OF PRESENTING ILLNESS:  Aaron Short 77 y.o. male presents to establish care for esophageal adenocarcinoma.  I have reviewed his chart and materials related to his cancer extensively and collaborated history with the patient. Summary of oncologic history is as follows: Oncology History  Malignant neoplasm of urinary bladder (HCC) (Resolved)  10/23/2013 Initial Diagnosis   Malignant neoplasm of urinary bladder (HCC)   11/03/2022 Cancer Staging   Staging form: Urinary Bladder, AJCC 7th Edition - Clinical: Stage 0a (Ta, N0, M0) - Signed by Rickard Patience, MD on 11/03/2022   Esophageal adenocarcinoma (HCC)  10/20/2022 Imaging   CT abdomen pelvis w contrast  -Multiple bilobar hepatic lesions extending throughout most of the liver parenchyma and concerning for metastatic disease. Correlation with tissue sampling could be considered for definitive pathologic diagnosis.   -  Circumferential wall thickening of the distal esophagus and gastroesophageal junction. Recommend further evaluation with endoscopy to exclude underlying malignancy. No other evidence of primary malignancy in the abdomen or pelvis although note is made that evaluation of the pelvic structures is very limited in this CT scan  due to extensive artifact secondary to the patient's bilateral total hip arthroplasty.   - Cystic lesion in the pancreatic tail measuring up to 1.0 cm, possibly a sidebranch IPMN. No obvious pancreatic ductal dilatation. Further evaluation with MRI/MRCP could be considered.   - Extensive degenerative changes to the spine with compression deformities of T11, T12 and L2, similar to prior.    10/20/2022 Imaging   CT chest w contrast   New 4 mm nodule in the posterior segment of left lower lobe, 3 mm nodule in the left upper chest of pulmonary metastasis.   Stable mild centrilobular emphysema and mild peripheral reticulation which may reflect smoking-related lung fibrosis.   Mildly dilated esophagus with air-fluid level and circumferential thickening in the distal esophagus which may reflect chronic esophagitis. Consider endoscopic evaluation.    11/03/2022 Initial Diagnosis   Esophageal adenocarcinoma   + dysphagia for both liquid and solid food, cough after eating. Unintentional weight loss 50 pounds over the past years, 14 pounds within last year.   10/30/22 EGD showed A large, submucosal mass with no bleeding and stigmata of recent bleeding was found in the lower third of the esophagus, 35 to 40 cm from the incisors. The mass was partially obstructing and circumferential. Biopsies were taken with a cold forceps for histology.  Pathology showed moderately differentiated adenocarcinoma involving squamocolumnar junctional mucosa with focal intestinal metaplasia.   [facility] NGS showed ERBB2 copy number gain, TP53 stop gain, NSD1 frameshift, RARA copy number gain, TOP2A copy number gain. TMB 7.9 m/mb, MSI stable.    11/03/2022 Cancer Staging   Staging form: Esophagus - Adenocarcinoma, AJCC 8th Edition - Clinical stage from 11/03/2022: Stage IVB (cTX, cNX, cM1) - Signed by Rickard Patience, MD on 11/03/2022 Stage prefix: Initial diagnosis   11/13/2022 Imaging   PET scan showed 1. Tracer avid tumor is identified  within the distal esophagus extending into the cardia. 2. Extensive tracer avid liver metastases. 3. Tiny nodule within the medial left lower lobe is too small to characterize by PET-CT measuring 4 mm. Attention to this nodule on future surveillance imaging is advised. 4. Asymmetric uptake localizing to the right first rib costosternal junction and left AC joint is noted. Findings are favored to represent arthropathic change 5. Gallstones. 6.  Aortic Atherosclerosis   11/13/2022 Procedure   Mediport placement by IR   11/15/2022 - 11/17/2022 Chemotherapy   Patient is on Treatment Plan : GASTROESOPHAGEAL FOLFOX q14d x 6 cycles     11/29/2022 -  Chemotherapy   Patient is on Treatment Plan : GASTROESOPHAGEAL Trastuzumab (6/4) D1 + FOLFOX D1 q14d x 12 cycles / Trastuzumab q14d      He is a current every day smoker. He denies abdominal pain, nausea vomiting. Gait is not steady, frequent falls, this is a chronic issue.  Aifb on Xarelto. He currently is able to eat soft moist food.  + chronic neck and back pain, joint pain, he takes oxycodone 10-20mg  every 4-6 hours. Pain is controlled better.   During the interval he had a fall and was admitted. Closed compression fracture of lumbar vertebral body, traumatic rhabdomyolysis.  Discharged on 12/05/2022  + Dysuria for about 6 weeks. Chronically increased urinary urgency. UA urine culture  negative.   INTERVAL HISTORY Johnthomas Geen is a 77 y.o. male who has above history reviewed by me today presents for follow up visit for esophageal cancer, post hospitalization visit.   weight decreased 4 pounds. He takes megace 20mg  BID + nausea daily. No vomiting. Antiemetic helps + recent hospitalization after a fall, elbow skin abrasion, pneumonia.  Was treated with IV antibiotics, discharged home with a course of oral antibiotics, he has finished. Today he feels at baseline.  Has not started PT/OT at home due to missed the initial assessment  appointment.  Pain is controlled with current pain regimen.    MEDICAL HISTORY:  Past Medical History:  Diagnosis Date   Arthralgia of lower leg 04/02/2012   Arthralgia of upper arm 06/18/2009   Cancer (HCC)    Diabetes mellitus without complication (HCC)    FH: atrial fibrillation    Hypertension     SURGICAL HISTORY: Past Surgical History:  Procedure Laterality Date   bladder cancer surgery  2015   ESOPHAGOGASTRODUODENOSCOPY (EGD) WITH PROPOFOL N/A 10/30/2022   Procedure: ESOPHAGOGASTRODUODENOSCOPY (EGD) WITH PROPOFOL;  Surgeon: Toney Reil, MD;  Location: [facility] ENDOSCOPY;  Service: Gastroenterology;  Laterality: N/A;   IR IMAGING GUIDED PORT INSERTION  11/13/2022   JOINT REPLACEMENT     REPLACEMENT TOTAL HIP W/  RESURFACING IMPLANTS Bilateral    SHOULDER SURGERY Bilateral    TOTAL KNEE ARTHROPLASTY Bilateral     SOCIAL HISTORY: Social History   Socioeconomic History   Marital status: Married    Spouse name: Not on file   Number of children: Not on file   Years of education: Not on file   Highest education level: Not on file  Occupational History   Not on file  Tobacco Use   Smoking status: Every Day    Current packs/day: 0.50    Types: Cigarettes   Smokeless tobacco: Never  Vaping Use   Vaping status: Never Used  Substance and Sexual Activity   Alcohol use: No    Alcohol/week: 0.0 standard drinks of alcohol   Drug use: No   Sexual activity: Not on file  Other Topics Concern   Not on file  Social History Narrative   Lives with wife, [name].    Social Determinants of Health   Financial Resource Strain: Low Risk  (11/03/2022)   Overall Financial Resource Strain (CARDIA)    Difficulty of Paying Living Expenses: Not very hard  Food Insecurity: No Food Insecurity (01/12/2023)   Hunger Vital Sign    Worried About Running Out of Food in the Last Year: Never true    Ran Out of Food in the Last Year: Never true  Transportation Needs: No Transportation Needs  (01/12/2023)   PRAPARE - Administrator, Civil Service (Medical): No    Lack of Transportation (Non-Medical): No  Physical Activity: Sufficiently Active (02/16/2021)   [facility] from [facility], [facility]   Exercise Vital Sign    Days of Exercise per Week: 7 days    Minutes of Exercise per Session: 150+ min  Stress: No Stress Concern Present (11/03/2022)   [employer] of Occupational Health - Occupational Stress Questionnaire    Feeling of Stress : Only a little  Social Connections: Moderately Integrated (02/16/2021)   Received from [facility], [facility]   Social Connection and Isolation Panel [NHANES]    Frequency of Communication with Friends and Family: Three times a week    Frequency of Social Gatherings  with Friends and Family: Three times a week    Attends Religious Services: Never    Active Member of Clubs or Organizations: Yes    Attends Banker Meetings: More than 4 times per year    Marital Status: Married  Catering manager Violence: Not At Risk (01/12/2023)   Humiliation, Afraid, Rape, and Kick questionnaire    Fear of Current or Ex-Partner: No    Emotionally Abused: No    Physically Abused: No    Sexually Abused: No    FAMILY HISTORY: Family History  Problem Relation Age of Onset   Dementia Mother    Heart disease Father     ALLERGIES:  is allergic to diltiazem, penicillins, and tizanidine.  MEDICATIONS:  Current Outpatient Medications  Medication Sig Dispense Refill   B Complex-C (VITAMIN B + C COMPLEX) TABS Take 1 capsule by mouth every morning.     carisoprodol (SOMA) 350 MG tablet Take 350 mg by mouth 2 (two) times daily.      dexamethasone (DECADRON) 4 MG tablet Take 2 tablets (8 mg total) by mouth daily. Start the day after chemotherapy for 2 days. Take with food. 30 tablet 1   lidocaine-prilocaine (EMLA) cream Apply to affected area once 30 g 3   loperamide (IMODIUM) 2 MG capsule Take 1 capsule (2 mg total)  by mouth See admin instructions. Initial: 4 mg,the 2 mg every 2 hours (4 mg every 4 hours at night)  maximum: 16 mg/day 60 capsule 2   LORazepam (ATIVAN) 0.5 MG tablet Take 1 tablet (0.5 mg total) by mouth every 8 (eight) hours as needed (nausea). 90 tablet 0   magic mouthwash w/lidocaine SOLN Take 5 mLs by mouth 4 (four) times daily as needed for mouth pain. Sig: Swish/Swallow 5-10 ml four times a day as needed. Dispense 480 ml. 1RF 480 mL 1   Magnesium 500 MG TABS Take 500 mg by mouth as needed.     megestrol (MEGACE) 20 MG tablet Take 1 tablet (20 mg total) by mouth 2 (two) times daily. 60 tablet 1   melatonin (MELATONIN MAXIMUM STRENGTH) 5 MG TABS Take 5 mg by mouth at bedtime as needed.     omeprazole (PRILOSEC) 10 MG capsule Take 20 mg by mouth daily.     ondansetron (ZOFRAN) 8 MG tablet Take 1 tablet (8 mg total) by mouth every 8 (eight) hours as needed for nausea or vomiting. Start on the third day after chemotherapy. 90 tablet 1   Oxycodone HCl 10 MG TABS Take 1-2 tablets (10-20 mg total) by mouth every 4 (four) hours as needed (pain). 120 tablet 0   prazosin (MINIPRESS) 1 MG capsule Take 2 mg by mouth at bedtime.     prochlorperazine (COMPAZINE) 10 MG tablet Take 1 tablet (10 mg total) by mouth every 6 (six) hours as needed for nausea or vomiting. 90 tablet 1   rivaroxaban (XARELTO) 20 MG TABS tablet Take 20 mg by mouth daily.     sertraline (ZOLOFT) 50 MG tablet Take 100 mg by mouth at bedtime.     naloxone (NARCAN) nasal spray 4 mg/0.1 mL Place 1 spray into the nose once for 1 dose. Spray half of bottle content into each nostril, then call 911 (Patient not taking: Reported on 12/27/2022) 2 each 0   No current facility-administered medications for this visit.   Facility-Administered Medications Ordered in Other Visits  Medication Dose Route Frequency Provider Last Rate Last Admin   0.9 %  sodium chloride  infusion   Intravenous Once Rickard Patience, MD       acetaminophen (TYLENOL) tablet 650  mg  650 mg Oral Once Rickard Patience, MD       dexamethasone (DECADRON) 10 mg in sodium chloride 0.9 % 50 mL IVPB  10 mg Intravenous Once Rickard Patience, MD       diphenhydrAMINE (BENADRYL) capsule 50 mg  50 mg Oral Once Rickard Patience, MD       fluorouracil (ADRUCIL) 4,000 mg in sodium chloride 0.9 % 70 mL chemo infusion  2,400 mg/m2 (Order-Specific) Intravenous 1 day or 1 dose Rickard Patience, MD       fluorouracil (ADRUCIL) chemo injection 650 mg  400 mg/m2 (Order-Specific) Intravenous Once Rickard Patience, MD       leucovorin 692 mg in dextrose 5 % 250 mL infusion  400 mg/m2 (Treatment Plan Recorded) Intravenous Once Rickard Patience, MD       palonosetron (ALOXI) injection 0.25 mg  0.25 mg Intravenous Once Rickard Patience, MD       trastuzumab-[facility]) 231 mg in sodium chloride 0.9 % 250 mL chemo infusion  4 mg/kg (Order-Specific) Intravenous Once Rickard Patience, MD        Review of Systems  Constitutional:  Positive for appetite change, fatigue and unexpected weight change. Negative for chills and fever.  HENT:   Negative for hearing loss and voice change.   Eyes:  Negative for eye problems and icterus.  Respiratory:  Negative for chest tightness, cough and shortness of breath.   Cardiovascular:  Negative for chest pain and leg swelling.  Gastrointestinal:  Negative for abdominal distention and abdominal pain.       Heart burn  Endocrine: Negative for hot flashes.  Genitourinary:  Negative for difficulty urinating, dysuria and frequency.   Musculoskeletal:  Positive for arthralgias, back pain, gait problem and neck pain.  Skin:  Negative for itching and rash.  Neurological:  Positive for gait problem. Negative for light-headedness and numbness.       Falls.   Hematological:  Negative for adenopathy. Does not bruise/bleed easily.  Psychiatric/Behavioral:  Negative for confusion.      PHYSICAL EXAMINATION: ECOG PERFORMANCE STATUS: 1 - Symptomatic but completely ambulatory  Vitals:   01/24/23 0837  BP: 118/64  Pulse: 76   Resp: 18  Temp: (!) 96.4 F (35.8 C)  SpO2: 99%   Filed Weights   01/24/23 0837  Weight: 127 lb 8 oz (57.8 kg)    Physical Exam Constitutional:      General: He is not in acute distress.    Appearance: He is ill-appearing. He is not diaphoretic.  HENT:     Head: Normocephalic and atraumatic.  Eyes:     General: No scleral icterus. Cardiovascular:     Rate and Rhythm: Normal rate and regular rhythm.  Pulmonary:     Effort: Pulmonary effort is normal. No respiratory distress.     Breath sounds: No wheezing.  Abdominal:     General: There is no distension.     Palpations: Abdomen is soft.     Tenderness: There is no abdominal tenderness.  Musculoskeletal:        General: Normal range of motion.     Cervical back: Normal range of motion and neck supple.  Skin:    General: Skin is warm and dry.     Findings: No erythema.  Neurological:     Mental Status: He is alert and oriented to person, place, and time. Mental status is  at baseline.     Cranial Nerves: No cranial nerve deficit.     Motor: No abnormal muscle tone.  Psychiatric:        Mood and Affect: Mood and affect normal.      LABORATORY DATA:  I have reviewed the data as listed    Latest Ref Rng & Units 01/24/2023    8:12 AM 01/14/2023    4:39 AM 01/13/2023    6:08 AM  CBC  WBC 4.0 - 10.5 K/uL 8.4  6.2  6.2   Hemoglobin 13.0 - 17.0 g/dL 16.1  09.6  04.5   Hematocrit 39.0 - 52.0 % 32.4  31.4  32.6   Platelets 150 - 400 K/uL 200  235  242       Latest Ref Rng & Units 01/24/2023    8:12 AM 01/14/2023    4:39 AM 01/13/2023    6:08 AM  CMP  Glucose 70 - 99 mg/dL 409  811  85   BUN 8 - 23 mg/dL 19  21  19    Creatinine 0.61 - 1.24 mg/dL 9.14  7.82  9.56   Sodium 135 - 145 mmol/L 132  135  133   Potassium 3.5 - 5.1 mmol/L 3.7  4.4  4.3   Chloride 98 - 111 mmol/L 99  101  101   CO2 22 - 32 mmol/L 26  27  26    Calcium 8.9 - 10.3 mg/dL 8.5  8.7  8.5   Total Protein 6.5 - 8.1 g/dL 6.3     Total Bilirubin 0.3 -  1.2 mg/dL 0.6     Alkaline Phos 38 - 126 U/L 117     AST 15 - 41 U/L 34     ALT 0 - 44 U/L 20        RADIOGRAPHIC STUDIES: I have personally reviewed the radiological images as listed and agreed with the findings in the report. CT HEAD WO CONTRAST ( )  Result Date: 01/11/2023 CLINICAL DATA:  Recent fall with headaches and neck pain, initial encounter EXAM: CT HEAD WITHOUT CONTRAST CT CERVICAL SPINE WITHOUT CONTRAST TECHNIQUE: Multidetector CT imaging of the head and cervical spine was performed following the standard protocol without intravenous contrast. Multiplanar CT image reconstructions of the cervical spine were also generated. RADIATION DOSE REDUCTION: This exam was performed according to the departmental dose-optimization program which includes automated exposure control, adjustment of the mA and/or kV according to patient size and/or use of iterative reconstruction technique. COMPARISON:  12/04/2022 FINDINGS: CT HEAD FINDINGS Brain: No evidence of acute infarction, hemorrhage, hydrocephalus, extra-axial collection or mass lesion/mass effect. Chronic atrophic and ischemic changes are again noted. Vascular: No hyperdense vessel or unexpected calcification. Skull: Normal. Negative for fracture or focal lesion. Sinuses/Orbits: No acute finding. Other: None. CT CERVICAL SPINE FINDINGS Alignment: Within normal limits. Skull base and vertebrae: 7 cervical segments are well visualized. Multilevel osteophytic change and facet hypertrophic changes seen. No compression deformity is noted. No acute fracture or acute facet abnormality is seen. Soft tissues and spinal canal: Surrounding soft tissue structures are within normal limits. Upper chest: Visualized lung apices demonstrate mild pleuroparenchymal scarring particularly on the left. Other: None IMPRESSION: CT of the head: Chronic atrophic and ischemic changes without acute abnormality. CT of cervical spine: Multilevel degenerative change without acute  abnormality. Electronically Signed   By: Alcide Clever M.D.   On: 01/11/2023 23:42   CT Cervical Spine Wo Contrast  Result Date: 01/11/2023 CLINICAL DATA:  Recent fall with headaches  and neck pain, initial encounter EXAM: CT HEAD WITHOUT CONTRAST CT CERVICAL SPINE WITHOUT CONTRAST TECHNIQUE: Multidetector CT imaging of the head and cervical spine was performed following the standard protocol without intravenous contrast. Multiplanar CT image reconstructions of the cervical spine were also generated. RADIATION DOSE REDUCTION: This exam was performed according to the departmental dose-optimization program which includes automated exposure control, adjustment of the mA and/or kV according to patient size and/or use of iterative reconstruction technique. COMPARISON:  12/04/2022 FINDINGS: CT HEAD FINDINGS Brain: No evidence of acute infarction, hemorrhage, hydrocephalus, extra-axial collection or mass lesion/mass effect. Chronic atrophic and ischemic changes are again noted. Vascular: No hyperdense vessel or unexpected calcification. Skull: Normal. Negative for fracture or focal lesion. Sinuses/Orbits: No acute finding. Other: None. CT CERVICAL SPINE FINDINGS Alignment: Within normal limits. Skull base and vertebrae: 7 cervical segments are well visualized. Multilevel osteophytic change and facet hypertrophic changes seen. No compression deformity is noted. No acute fracture or acute facet abnormality is seen. Soft tissues and spinal canal: Surrounding soft tissue structures are within normal limits. Upper chest: Visualized lung apices demonstrate mild pleuroparenchymal scarring particularly on the left. Other: None IMPRESSION: CT of the head: Chronic atrophic and ischemic changes without acute abnormality. CT of cervical spine: Multilevel degenerative change without acute abnormality. Electronically Signed   By: Alcide Clever M.D.   On: 01/11/2023 23:42   DG Chest Portable 1 View  Result Date: 01/11/2023 CLINICAL  DATA:  Shortness of breath EXAM: PORTABLE CHEST 1 VIEW COMPARISON:  Chest CT 12/04/2022 FINDINGS: Right-sided central venous port tip at the distal SVC. Evidence of chronic lung disease with coarse interstitial pattern. Possible patchy superimposed opacities. Stable cardiomediastinal silhouette. No pneumothorax. IMPRESSION: Chronic lung disease with possible patchy superimposed opacities which may reflect minimal pneumonia, to include atypical organism. Electronically Signed   By: Jasmine Pang M.D.   On: 01/11/2023 23:30   DG Lumbar Spine 2-3 Views  Result Date: 01/07/2023 CLINICAL DATA:  L1 fracture, fell 2 weeks ago EXAM: LUMBAR SPINE - 2-3 VIEW COMPARISON:  12/04/2022, 10/20/2022 FINDINGS: Frontal and lateral views of the lumbar spine are obtained. Mild compression deformity of the superior endplate of L1, as well as the moderate anterior wedge compression deformity of L2, are unchanged since the recent CT exam. No new fractures. Stable multilevel spondylosis and facet hypertrophy, greatest from L1-2 through L3-4. Partial visualization of bilateral hip arthroplasties. IMPRESSION: 1. Stable chronic compression deformities at L1 and L2, unchanged since the previous exam. 2. Stable multilevel lumbar degenerative change. Electronically Signed   By: Sharlet Salina M.D.   On: 01/07/2023 12:00

## 2023-01-24 NOTE — Progress Notes (Signed)
Nutrition Follow-up:  Patient with esophageal adenocarcinoma, stage IV with liver involvement.  Patient receiving folfox and transuzumab.    Met with patient during infusion.  Noted recent hospitalization for pneumonia, weakness, fall.  Continues with megace and feels like it is helping appetite some.  Often will think he wants something to eat and then when it gets in front of him the desire is gone.  Sometimes will think about food and he gets nauseated.  Has been drinking 3 boost VHC shakes at day.  Yesterday drank 3 boost VHC shakes and ate mini muffin.    MD holding oxaliplatin today  Medications: megace  Labs: Na 132, glucose 144  Anthropometrics:   Weight 127 lb 8 oz today 131 lb 1.6 oz on 8/14 132 lb on 7/31 135 lb on 7/17 154 lb on 01/19/22   NUTRITION DIAGNOSIS: Inadequate oral intake ongoing  Severe malnutrition continues   INTERVENTION:  Continue boost VHC, ideally 4 + times per day Encouraged eating/drinking q 2 hours. Make a schedule Discussed ways to add calories and protein in diet (peanut butter on banana, cheese on toast, etc)    MONITORING, EVALUATION, GOAL: weight trends, intake   NEXT VISIT: Wednesday, Sept 11 during infusion  Jazalyn Mondor B. Freida Busman, RD, LDN Registered Dietitian [phone]

## 2023-01-25 LAB — CEA: CEA: 59.8 ng/mL — ABNORMAL HIGH (ref 0.0–4.7)

## 2023-01-26 ENCOUNTER — Inpatient Hospital Stay: Payer: Medicare Other

## 2023-01-26 VITALS — BP 104/66 | HR 78 | Resp 18

## 2023-01-26 DIAGNOSIS — C159 Malignant neoplasm of esophagus, unspecified: Secondary | ICD-10-CM

## 2023-01-26 DIAGNOSIS — Z5112 Encounter for antineoplastic immunotherapy: Secondary | ICD-10-CM | POA: Diagnosis not present

## 2023-01-26 MED ORDER — SODIUM CHLORIDE 0.9% FLUSH
10.0000 mL | INTRAVENOUS | Status: DC | PRN
  Administered 2023-01-26: 10 mL
  Filled 2023-01-26: qty 10

## 2023-01-26 MED ORDER — HEPARIN SOD (PORK) LOCK FLUSH 100 UNIT/ML IV SOLN
500.0000 [IU] | Freq: Once | INTRAVENOUS | Status: AC | PRN
  Administered 2023-01-26: 500 [IU]
  Filled 2023-01-26: qty 5

## 2023-01-31 ENCOUNTER — Ambulatory Visit
Admission: RE | Admit: 2023-01-31 | Discharge: 2023-01-31 | Disposition: A | Discharge status: Home / Self Care | Payer: Medicare Other | Source: Ambulatory Visit | Attending: Oncology | Admitting: Oncology

## 2023-01-31 DIAGNOSIS — C159 Malignant neoplasm of esophagus, unspecified: Secondary | ICD-10-CM | POA: Insufficient documentation

## 2023-01-31 MED ORDER — IOHEXOL 300 MG/ML  SOLN
100.0000 mL | Freq: Once | INTRAMUSCULAR | Status: AC | PRN
  Administered 2023-01-31: 100 mL via INTRAVENOUS

## 2023-02-01 ENCOUNTER — Other Ambulatory Visit: Payer: Self-pay | Admitting: Oncology

## 2023-02-01 ENCOUNTER — Encounter: Payer: Self-pay | Admitting: Oncology

## 2023-02-01 DIAGNOSIS — C159 Malignant neoplasm of esophagus, unspecified: Secondary | ICD-10-CM

## 2023-02-02 ENCOUNTER — Encounter: Payer: Self-pay | Admitting: Oncology

## 2023-02-05 ENCOUNTER — Encounter: Payer: Self-pay | Admitting: Oncology

## 2023-02-06 MED FILL — Dexamethasone Sodium Phosphate Inj 100 MG/10ML: INTRAMUSCULAR | Qty: 1 | Status: AC

## 2023-02-07 ENCOUNTER — Inpatient Hospital Stay: Payer: Medicare Other

## 2023-02-07 ENCOUNTER — Inpatient Hospital Stay (HOSPITAL_BASED_OUTPATIENT_CLINIC_OR_DEPARTMENT_OTHER): Payer: Medicare Other | Admitting: Oncology

## 2023-02-07 ENCOUNTER — Ambulatory Visit: Payer: Medicare Other

## 2023-02-07 ENCOUNTER — Encounter: Payer: Self-pay | Admitting: Oncology

## 2023-02-07 ENCOUNTER — Inpatient Hospital Stay: Payer: Medicare Other | Attending: Oncology

## 2023-02-07 VITALS — BP 108/55 | HR 89 | Temp 96.1°F | Resp 18 | Wt 128.0 lb

## 2023-02-07 DIAGNOSIS — Z515 Encounter for palliative care: Secondary | ICD-10-CM | POA: Insufficient documentation

## 2023-02-07 DIAGNOSIS — K1230 Oral mucositis (ulcerative), unspecified: Secondary | ICD-10-CM | POA: Diagnosis not present

## 2023-02-07 DIAGNOSIS — C787 Secondary malignant neoplasm of liver and intrahepatic bile duct: Secondary | ICD-10-CM | POA: Diagnosis not present

## 2023-02-07 DIAGNOSIS — Z79891 Long term (current) use of opiate analgesic: Secondary | ICD-10-CM | POA: Diagnosis not present

## 2023-02-07 DIAGNOSIS — C159 Malignant neoplasm of esophagus, unspecified: Secondary | ICD-10-CM

## 2023-02-07 DIAGNOSIS — W19XXXA Unspecified fall, initial encounter: Secondary | ICD-10-CM | POA: Insufficient documentation

## 2023-02-07 DIAGNOSIS — S41112A Laceration without foreign body of left upper arm, initial encounter: Secondary | ICD-10-CM | POA: Insufficient documentation

## 2023-02-07 DIAGNOSIS — F17210 Nicotine dependence, cigarettes, uncomplicated: Secondary | ICD-10-CM | POA: Insufficient documentation

## 2023-02-07 DIAGNOSIS — G893 Neoplasm related pain (acute) (chronic): Secondary | ICD-10-CM | POA: Insufficient documentation

## 2023-02-07 DIAGNOSIS — C7802 Secondary malignant neoplasm of left lung: Secondary | ICD-10-CM | POA: Insufficient documentation

## 2023-02-07 DIAGNOSIS — Z96643 Presence of artificial hip joint, bilateral: Secondary | ICD-10-CM | POA: Insufficient documentation

## 2023-02-07 DIAGNOSIS — K1231 Oral mucositis (ulcerative) due to antineoplastic therapy: Secondary | ICD-10-CM | POA: Insufficient documentation

## 2023-02-07 DIAGNOSIS — C155 Malignant neoplasm of lower third of esophagus: Secondary | ICD-10-CM | POA: Insufficient documentation

## 2023-02-07 DIAGNOSIS — C679 Malignant neoplasm of bladder, unspecified: Secondary | ICD-10-CM | POA: Diagnosis not present

## 2023-02-07 DIAGNOSIS — Z7952 Long term (current) use of systemic steroids: Secondary | ICD-10-CM | POA: Diagnosis not present

## 2023-02-07 DIAGNOSIS — E860 Dehydration: Secondary | ICD-10-CM | POA: Diagnosis not present

## 2023-02-07 DIAGNOSIS — Z8551 Personal history of malignant neoplasm of bladder: Secondary | ICD-10-CM | POA: Diagnosis not present

## 2023-02-07 DIAGNOSIS — R531 Weakness: Secondary | ICD-10-CM | POA: Diagnosis not present

## 2023-02-07 DIAGNOSIS — Z5112 Encounter for antineoplastic immunotherapy: Secondary | ICD-10-CM | POA: Insufficient documentation

## 2023-02-07 DIAGNOSIS — Z23 Encounter for immunization: Secondary | ICD-10-CM

## 2023-02-07 DIAGNOSIS — R300 Dysuria: Secondary | ICD-10-CM | POA: Diagnosis not present

## 2023-02-07 DIAGNOSIS — R2681 Unsteadiness on feet: Secondary | ICD-10-CM | POA: Insufficient documentation

## 2023-02-07 DIAGNOSIS — Z9181 History of falling: Secondary | ICD-10-CM | POA: Insufficient documentation

## 2023-02-07 DIAGNOSIS — I4891 Unspecified atrial fibrillation: Secondary | ICD-10-CM | POA: Insufficient documentation

## 2023-02-07 DIAGNOSIS — I4892 Unspecified atrial flutter: Secondary | ICD-10-CM | POA: Diagnosis not present

## 2023-02-07 DIAGNOSIS — Z5111 Encounter for antineoplastic chemotherapy: Secondary | ICD-10-CM | POA: Insufficient documentation

## 2023-02-07 DIAGNOSIS — Z452 Encounter for adjustment and management of vascular access device: Secondary | ICD-10-CM | POA: Diagnosis not present

## 2023-02-07 DIAGNOSIS — Z79899 Other long term (current) drug therapy: Secondary | ICD-10-CM | POA: Insufficient documentation

## 2023-02-07 DIAGNOSIS — Z7901 Long term (current) use of anticoagulants: Secondary | ICD-10-CM | POA: Insufficient documentation

## 2023-02-07 DIAGNOSIS — R42 Dizziness and giddiness: Secondary | ICD-10-CM

## 2023-02-07 LAB — CBC WITH DIFFERENTIAL (CANCER CENTER ONLY)
Abs Immature Granulocytes: 0.03 10*3/uL (ref 0.00–0.07)
Basophils Absolute: 0 10*3/uL (ref 0.0–0.1)
Basophils Relative: 0 %
Eosinophils Absolute: 0.1 10*3/uL (ref 0.0–0.5)
Eosinophils Relative: 2 %
HCT: 31 % — ABNORMAL LOW (ref 39.0–52.0)
Hemoglobin: 10.5 g/dL — ABNORMAL LOW (ref 13.0–17.0)
Immature Granulocytes: 0 %
Lymphocytes Relative: 25 %
Lymphs Abs: 2.3 10*3/uL (ref 0.7–4.0)
MCH: 34.5 pg — ABNORMAL HIGH (ref 26.0–34.0)
MCHC: 33.9 g/dL (ref 30.0–36.0)
MCV: 102 fL — ABNORMAL HIGH (ref 80.0–100.0)
Monocytes Absolute: 1.1 10*3/uL — ABNORMAL HIGH (ref 0.1–1.0)
Monocytes Relative: 12 %
Neutro Abs: 5.6 10*3/uL (ref 1.7–7.7)
Neutrophils Relative %: 61 %
Platelet Count: 207 10*3/uL (ref 150–400)
RBC: 3.04 MIL/uL — ABNORMAL LOW (ref 4.22–5.81)
RDW: 17.8 % — ABNORMAL HIGH (ref 11.5–15.5)
WBC Count: 9.2 10*3/uL (ref 4.0–10.5)
nRBC: 0 % (ref 0.0–0.2)

## 2023-02-07 LAB — CMP (CANCER CENTER ONLY)
ALT: 20 U/L (ref 0–44)
AST: 35 U/L (ref 15–41)
Albumin: 3.1 g/dL — ABNORMAL LOW (ref 3.5–5.0)
Alkaline Phosphatase: 117 U/L (ref 38–126)
Anion gap: 8 (ref 5–15)
BUN: 24 mg/dL — ABNORMAL HIGH (ref 8–23)
CO2: 27 mmol/L (ref 22–32)
Calcium: 8.7 mg/dL — ABNORMAL LOW (ref 8.9–10.3)
Chloride: 98 mmol/L (ref 98–111)
Creatinine: 0.7 mg/dL (ref 0.61–1.24)
GFR, Estimated: >60 mL/min (ref >60)
Glucose, Bld: 145 mg/dL — ABNORMAL HIGH (ref 70–99)
Potassium: 3.7 mmol/L (ref 3.5–5.1)
Sodium: 133 mmol/L — ABNORMAL LOW (ref 135–145)
Total Bilirubin: 0.5 mg/dL (ref 0.3–1.2)
Total Protein: 6.3 g/dL — ABNORMAL LOW (ref 6.5–8.1)

## 2023-02-07 MED ORDER — NYSTATIN 100000 UNIT/ML MT SUSP: 5.0000 mL | Freq: Four times a day (QID) | OROMUCOSAL | 1 refills | Status: AC

## 2023-02-07 MED ORDER — TRASTUZUMAB-ANNS CHEMO 150 MG IV SOLR
4.0000 mg/kg | Freq: Once | INTRAVENOUS | Status: AC
  Administered 2023-02-07: 231 mg via INTRAVENOUS
  Filled 2023-02-07: qty 11

## 2023-02-07 MED ORDER — SODIUM CHLORIDE 0.9 % IV SOLN
Freq: Once | INTRAVENOUS | Status: AC
  Filled 2023-02-07: qty 250

## 2023-02-07 MED ORDER — SODIUM CHLORIDE 0.9 % IV SOLN
2400.0000 mg/m2 | INTRAVENOUS | Status: DC
  Administered 2023-02-07: 4000 mg via INTRAVENOUS
  Filled 2023-02-07: qty 80

## 2023-02-07 MED ORDER — INFLUENZA VAC A&B SURF ANT ADJ 0.5 ML IM SUSY
0.5000 mL | PREFILLED_SYRINGE | Freq: Once | INTRAMUSCULAR | Status: AC
  Administered 2023-02-07: 0.5 mL via INTRAMUSCULAR
  Filled 2023-02-07: qty 0.5

## 2023-02-07 MED ORDER — SODIUM CHLORIDE 0.9 % IV SOLN
10.0000 mg | Freq: Once | INTRAVENOUS | Status: AC
  Administered 2023-02-07: 10 mg via INTRAVENOUS
  Filled 2023-02-07: qty 10

## 2023-02-07 MED ORDER — LEUCOVORIN CALCIUM INJECTION 350 MG
400.0000 mg/m2 | Freq: Once | INTRAVENOUS | Status: AC
  Administered 2023-02-07: 692 mg via INTRAVENOUS
  Filled 2023-02-07: qty 34.6

## 2023-02-07 MED ORDER — DIPHENHYDRAMINE HCL 25 MG PO CAPS
50.0000 mg | ORAL_CAPSULE | Freq: Once | ORAL | Status: AC
  Administered 2023-02-07: 50 mg via ORAL
  Filled 2023-02-07: qty 2

## 2023-02-07 MED ORDER — SODIUM CHLORIDE 0.9% FLUSH
10.0000 mL | INTRAVENOUS | Status: DC | PRN
  Administered 2023-02-07: 10 mL
  Filled 2023-02-07: qty 10

## 2023-02-07 MED ORDER — ACETAMINOPHEN 325 MG PO TABS
650.0000 mg | ORAL_TABLET | Freq: Once | ORAL | Status: AC
  Administered 2023-02-07: 650 mg via ORAL
  Filled 2023-02-07: qty 2

## 2023-02-07 MED ORDER — DEXTROSE 5 % IV SOLN
Freq: Once | INTRAVENOUS | Status: DC
  Filled 2023-02-07: qty 250

## 2023-02-07 NOTE — Patient Instructions (Addendum)
[place] [facility]  Discharge Instructions: Thank you for choosing [facility] to provide your oncology and hematology care.  If you have a lab appointment with the Cancer Center, please go directly to the Cancer Center and check in at the registration area.  Wear comfortable clothing and clothing appropriate for easy access to any Portacath or PICC line.   We strive to give you quality time with your provider. You may need to reschedule your appointment if you arrive late (15 or more minutes).  Arriving late affects you and other patients whose appointments are after yours.  Also, if you miss three or more appointments without notifying the office, you may be dismissed from the clinic at the provider's discretion.      For prescription refill requests, have your pharmacy contact our office and allow 72 hours for refills to be completed.    Today you received the following chemotherapy and/or immunotherapy agents: fluorouracil /leucovorin/ trastuzumab-anns   To help prevent nausea and vomiting after your treatment, we encourage you to take your nausea medication as directed.  BELOW ARE SYMPTOMS THAT SHOULD BE REPORTED IMMEDIATELY: *FEVER GREATER THAN 100.4 F (38 C) OR HIGHER *CHILLS OR SWEATING *NAUSEA AND VOMITING THAT IS NOT CONTROLLED WITH YOUR NAUSEA MEDICATION *UNUSUAL SHORTNESS OF BREATH *UNUSUAL BRUISING OR BLEEDING *URINARY PROBLEMS (pain or burning when urinating, or frequent urination) *BOWEL PROBLEMS (unusual diarrhea, constipation, pain near the anus) TENDERNESS IN MOUTH AND THROAT WITH OR WITHOUT PRESENCE OF ULCERS (sore throat, sores in mouth, or a toothache) UNUSUAL RASH, SWELLING OR PAIN  UNUSUAL VAGINAL DISCHARGE OR ITCHING   Items with * indicate a potential emergency and should be followed up as soon as possible or go to the Emergency Department if any problems should occur.  Please show the CHEMOTHERAPY ALERT CARD or  IMMUNOTHERAPY ALERT CARD at check-in to the Emergency Department and triage nurse.  Should you have questions after your visit or need to cancel or reschedule your appointment, please contact [place] [facility]  [phone] and follow the prompts.  Office hours are 8:00 a.m. to 4:30 p.m. Monday - Friday. Please note that voicemails left after 4:00 p.m. may not be returned until the following business day.  We are closed weekends and major holidays. You have access to a nurse at all times for urgent questions. Please call the main number to the clinic [phone] and follow the prompts.  For any non-urgent questions, you may also contact your provider using MyChart. We now offer e-Visits for anyone 61 and older to request care online for non-urgent symptoms. For details visit [email or web address].   Also download the MyChart app! Go to the app store, search "MyChart", open the app, select [place], and log in with your MyChart username and password.  Influenza Vaccine Injection What is this medication? INFLUENZA VACCINE (in floo EN zuh vak SEEN) reduces the risk of the influenza (flu). It does not treat influenza. It is still possible to get influenza after receiving this vaccine, but the symptoms may be less severe or not last as long. It works by helping your immune system learn how to fight off a future infection. This medicine may be used for other purposes; ask your health care provider or pharmacist if you have questions. COMMON BRAND NAME(S): Afluria Quadrivalent, FLUAD Quadrivalent, Fluarix Quadrivalent, Flublok Quadrivalent, FLUCELVAX Quadrivalent, Flulaval Quadrivalent, Fluzone Quadrivalent What should I tell my care team before I take this medication? They need  to know if you have any of these conditions: Bleeding disorder like hemophilia Fever or infection Guillain-Barre syndrome or other neurological problems Immune system problems Infection with the  human immunodeficiency virus (HIV) or AIDS Low blood platelet counts Multiple sclerosis An unusual or allergic reaction to influenza virus vaccine, latex, other medications, foods, dyes, or preservatives. Different brands of vaccines contain different allergens. Some may contain latex or eggs. Talk to your care team about your allergies to make sure that you get the right vaccine. Pregnant or trying to get pregnant Breastfeeding How should I use this medication? This vaccine is injected into a muscle or under the skin. It is given by your care team. A copy of Vaccine Information Statements will be given before each vaccination. Be sure to read this sheet carefully each time. This sheet may change often. Talk to your care team to see which vaccines are right for you. Some vaccines should not be used in all age groups. Overdosage: If you think you have taken too much of this medicine contact a poison control center or emergency room at once. NOTE: This medicine is only for you. Do not share this medicine with others. What if I miss a dose? This does not apply. What may interact with this medication? Certain medications that lower your immune system, such as etanercept, anakinra, infliximab, adalimumab Certain medications that prevent or treat blood clots, such as warfarin Chemotherapy or radiation therapy Phenytoin Steroid medications, such as prednisone or cortisone Theophylline Vaccines This list may not describe all possible interactions. Give your health care provider a list of all the medicines, herbs, non-prescription drugs, or dietary supplements you use. Also tell them if you smoke, drink alcohol, or use illegal drugs. Some items may interact with your medicine. What should I watch for while using this medication? Report any side effects that do not go away with your care team. Call your care team if any unusual symptoms occur within 6 weeks of receiving this vaccine. You may still  catch the flu, but the illness is not usually as bad. You cannot get the flu from the vaccine. The vaccine will not protect against colds or other illnesses that may cause fever. The vaccine is needed every year. What side effects may I notice from receiving this medication? Side effects that you should report to your care team as soon as possible: Allergic reactions--skin rash, itching, hives, swelling of the face, lips, tongue, or throat Side effects that usually do not require medical attention (report these to your care team if they continue or are bothersome): Chills Fatigue Headache Joint pain Loss of appetite Muscle pain Nausea Pain, redness, or irritation at injection site This list may not describe all possible side effects. Call your doctor for medical advice about side effects. You may report side effects to FDA at 1-800-FDA-1088. Where should I keep my medication? The vaccine is only given by your care team. It will not be stored at home. NOTE: This sheet is a summary. It may not cover all possible information. If you have questions about this medicine, talk to your doctor, pharmacist, or health care provider.  2024 [employer]/Gold Standard (2021-10-25 00:00:00)

## 2023-02-07 NOTE — Progress Notes (Signed)
Per MD omit the 5-FU chemo push today

## 2023-02-07 NOTE — Progress Notes (Signed)
Hematology/Oncology Progress note [phone] [phone]        REFERRING PROVIDER: Rickard Patience, MD    CHIEF COMPLAINTS/PURPOSE OF CONSULTATION:  Esophageal cancer.   ASSESSMENT & PLAN:   Cancer Staging  Esophageal adenocarcinoma [facility]) Staging form: Esophagus - Adenocarcinoma, AJCC 8th Edition - Clinical stage from 11/03/2022: Stage IVB (cTX, cNX, cM1) - Signed by Rickard Patience, MD on 11/03/2022   Esophageal adenocarcinoma [facility]) Image findings and pathology results are reviewed with patient and wife.  PET scan results were reviewed and discussed with patient.  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  NGS positive for HER2 copy again. Labs are reviewed and discussed with patient. Proceed with 5-FU pump and Transtuzumab., hold off oxaliplatin and bolus 5-FU due to decreased PS Repeat MUGA in early Oct. Still has dysphagia, will ask GI Dr. Allegra Lai to evaluate him for cryotherapy.   Mucositis Magic mouth wash swish and spit.   Dizziness Likely due to decreased oral intake, not well hydration.  Encourage oral hydration  Unsteady gait could be due to borderline BP Need PT/OT    Orders Placed This Encounter  Procedures   NM Cardiac [facility]    Standing Status:   Future    Standing Expiration Date:   02/07/2024    Order Specific Question:   Will [facility] be the location of this test?    Answer:   Yes    Order Specific Question:   Preferred imaging location?    Answer:   [facility]   CEA    Standing Status:   Future    Standing Expiration Date:   03/06/2024   CBC with Differential (Cancer Center Only)    Standing Status:   Future    Standing Expiration Date:   03/06/2024   CMP (Cancer Center only)    Standing Status:   Future    Standing Expiration Date:   03/06/2024   CEA    Standing Status:   Future    Standing Expiration Date:   02/21/2024   CBC with Differential (Cancer Center Only)    Standing  Status:   Future    Standing Expiration Date:   02/21/2024   CMP (Cancer Center only)    Standing Status:   Future    Standing Expiration Date:   02/21/2024     Follow-up in 2 weeks for lab MD 5-FU and trastuzumab. All questions were answered. The patient knows to call the clinic with any problems, questions or concerns.  Rickard Patience, MD, PhD [facility] Hematology Oncology 02/07/2023    HISTORY OF PRESENTING ILLNESS:  Aaron Short 77 y.o. male presents to establish care for esophageal adenocarcinoma.  I have reviewed his chart and materials related to his cancer extensively and collaborated history with the patient. Summary of oncologic history is as follows: Oncology History  Malignant neoplasm of urinary bladder (HCC) (Resolved)  10/23/2013 Initial Diagnosis   Malignant neoplasm of urinary bladder (HCC)   11/03/2022 Cancer Staging   Staging form: Urinary Bladder, AJCC 7th Edition - Clinical: Stage 0a (Ta, N0, M0) - Signed by Rickard Patience, MD on 11/03/2022   Esophageal adenocarcinoma (HCC)  10/20/2022 Imaging   CT abdomen pelvis w contrast  -Multiple bilobar hepatic lesions extending throughout most of the liver parenchyma and concerning for metastatic disease. Correlation with tissue sampling could be considered for definitive pathologic diagnosis.   - Circumferential wall thickening of the distal esophagus and gastroesophageal junction. Recommend further evaluation  with endoscopy to exclude underlying malignancy. No other evidence of primary malignancy in the abdomen or pelvis although note is made that evaluation of the pelvic structures is very limited in this CT scan due to extensive artifact secondary to the patient's bilateral total hip arthroplasty.   - Cystic lesion in the pancreatic tail measuring up to 1.0 cm, possibly a sidebranch IPMN. No obvious pancreatic ductal dilatation. Further evaluation with MRI/MRCP could be considered.   - Extensive degenerative changes to  the spine with compression deformities of T11, T12 and L2, similar to prior.    10/20/2022 Imaging   CT chest w contrast   New 4 mm nodule in the posterior segment of left lower lobe, 3 mm nodule in the left upper chest of pulmonary metastasis.   Stable mild centrilobular emphysema and mild peripheral reticulation which may reflect smoking-related lung fibrosis.   Mildly dilated esophagus with air-fluid level and circumferential thickening in the distal esophagus which may reflect chronic esophagitis. Consider endoscopic evaluation.    11/03/2022 Initial Diagnosis   Esophageal adenocarcinoma   + dysphagia for both liquid and solid food, cough after eating. Unintentional weight loss 50 pounds over the past years, 14 pounds within last year.   10/30/22 EGD showed A large, submucosal mass with no bleeding and stigmata of recent bleeding was found in the lower third of the esophagus, 35 to 40 cm from the incisors. The mass was partially obstructing and circumferential. Biopsies were taken with a cold forceps for histology.  Pathology showed moderately differentiated adenocarcinoma involving squamocolumnar junctional mucosa with focal intestinal metaplasia.   [facility] NGS showed ERBB2 copy number gain, TP53 stop gain, NSD1 frameshift, RARA copy number gain, TOP2A copy number gain. TMB 7.9 m/mb, MSI stable.    11/03/2022 Cancer Staging   Staging form: Esophagus - Adenocarcinoma, AJCC 8th Edition - Clinical stage from 11/03/2022: Stage IVB (cTX, cNX, cM1) - Signed by Rickard Patience, MD on 11/03/2022 Stage prefix: Initial diagnosis   11/13/2022 Imaging   PET scan showed 1. Tracer avid tumor is identified within the distal esophagus extending into the cardia. 2. Extensive tracer avid liver metastases. 3. Tiny nodule within the medial left lower lobe is too small to characterize by PET-CT measuring 4 mm. Attention to this nodule on future surveillance imaging is advised. 4. Asymmetric uptake localizing to the right  first rib costosternal junction and left AC joint is noted. Findings are favored to represent arthropathic change 5. Gallstones. 6.  Aortic Atherosclerosis   11/13/2022 Procedure   Mediport placement by IR   11/15/2022 - 11/17/2022 Chemotherapy   Patient is on Treatment Plan : GASTROESOPHAGEAL FOLFOX q14d x 6 cycles     11/29/2022 -  Chemotherapy   Patient is on Treatment Plan : GASTROESOPHAGEAL Trastuzumab (6/4) D1 + FOLFOX D1 q14d x 12 cycles / Trastuzumab q14d     01/10/2023 - 01/14/2023 Hospital Admission   hospitalization after a fall, elbow skin abrasion, pneumonia.  Was treated with IV antibiotics, discharged home with a course of oral antibiotics   02/03/2023 Imaging   1. Circumferential wall thickening of the lower third of the esophagus and gastric cardia, similar to prior examination, consistent with known primary esophageal malignancy. 2. Numerous bulky, rim enhancing hepatic metastases are seen. Although these are difficult to clearly compare to prior noncontrast, nondiagnostic PET-CT, they are somewhat diminished in size, consistent with treatment response. 3. No other evidence of metastatic disease in the chest, abdomen, or pelvis. 4. Unchanged small nodule of the medial  dependent left lower lobe measuring 0.3 cm, almost certainly benign and incidental. Continued attention on follow-up. 5. Mild pulmonary fibrosis in a pattern with a slight apical to basal gradient, featuring irregular peripheral interstitial opacity, septal thickening, and subpleural bronchiolectasis without clear honeycombing. Findings are consistent with probable UIP pattern pulmonary fibrosis. 6. Cholelithiasis. 7. Emphysema. 8. Coronary artery disease.   Aortic Atherosclerosis (ICD10-I70.0) and Emphysema (ICD10-J43.9)    He is a current every day smoker. He denies abdominal pain, nausea vomiting. Gait is not steady, frequent falls, this is a chronic issue.  Aifb on Xarelto. He currently is able  to eat soft moist food.  + chronic neck and back pain, joint pain, he takes oxycodone 10-20mg  every 4-6 hours. Pain is controlled better.   During the interval he had a fall and was admitted. Closed compression fracture of lumbar vertebral body, traumatic rhabdomyolysis.  Discharged on 12/05/2022  + Dysuria for about 6 weeks. Chronically increased urinary urgency. UA urine culture negative.   INTERVAL HISTORY Aaron Short is a 77 y.o. male who has above history reviewed by me today presents for follow up visit for esophageal cancer, post hospitalization visit.   weight is stable . He takes megace 20mg  BID + nausea daily. No vomiting. Antiemetic helps Has not started PT/OT at home due to missed the initial assessment appointment.  Pain is controlled with current pain regimen, he follows up with palliative care service.  + dizziness with position changes. Unsteady gait.  + dysphagia   MEDICAL HISTORY:  Past Medical History:  Diagnosis Date   Arthralgia of lower leg 04/02/2012   Arthralgia of upper arm 06/18/2009   Cancer (HCC)    Diabetes mellitus without complication (HCC)    FH: atrial fibrillation    Hypertension     SURGICAL HISTORY: Past Surgical History:  Procedure Laterality Date   bladder cancer surgery  2015   ESOPHAGOGASTRODUODENOSCOPY (EGD) WITH PROPOFOL N/A 10/30/2022   Procedure: ESOPHAGOGASTRODUODENOSCOPY (EGD) WITH PROPOFOL;  Surgeon: Toney Reil, MD;  Location: [facility] ENDOSCOPY;  Service: Gastroenterology;  Laterality: N/A;   IR IMAGING GUIDED PORT INSERTION  11/13/2022   JOINT REPLACEMENT     REPLACEMENT TOTAL HIP W/  RESURFACING IMPLANTS Bilateral    SHOULDER SURGERY Bilateral    TOTAL KNEE ARTHROPLASTY Bilateral     SOCIAL HISTORY: Social History   Socioeconomic History   Marital status: Married    Spouse name: Not on file   Number of children: Not on file   Years of education: Not on file   Highest education level: Not on file   Occupational History   Not on file  Tobacco Use   Smoking status: Every Day    Current packs/day: 0.50    Types: Cigarettes   Smokeless tobacco: Never  Vaping Use   Vaping status: Never Used  Substance and Sexual Activity   Alcohol use: No    Alcohol/week: 0.0 standard drinks of alcohol   Drug use: No   Sexual activity: Not on file  Other Topics Concern   Not on file  Social History Narrative   Lives with wife, [name].    Social Determinants of Health   Financial Resource Strain: Low Risk  (11/03/2022)   Overall Financial Resource Strain (CARDIA)    Difficulty of Paying Living Expenses: Not very hard  Food Insecurity: No Food Insecurity (01/12/2023)   Hunger Vital Sign    Worried About Running Out of Food in the Last Year: Never true    Ran  Out of Food in the Last Year: Never true  Transportation Needs: No Transportation Needs (01/12/2023)   PRAPARE - Administrator, Civil Service (Medical): No    Lack of Transportation (Non-Medical): No  Physical Activity: Sufficiently Active (02/16/2021)   Received from [facility], [facility]   Exercise Vital Sign    Days of Exercise per Week: 7 days    Minutes of Exercise per Session: 150+ min  Stress: No Stress Concern Present (11/03/2022)   [employer] of Occupational Health - Occupational Stress Questionnaire    Feeling of Stress : Only a little  Social Connections: Moderately Integrated (02/16/2021)   Received from [facility], [facility]   Social Connection and Isolation Panel [NHANES]    Frequency of Communication with Friends and Family: Three times a week    Frequency of Social Gatherings with Friends and Family: Three times a week    Attends Religious Services: Never    Active Member of Clubs or Organizations: Yes    Attends Banker Meetings: More than 4 times per year    Marital Status: Married  Catering manager Violence: Not At Risk (01/12/2023)   Humiliation, Afraid, Rape, and  Kick questionnaire    Fear of Current or Ex-Partner: No    Emotionally Abused: No    Physically Abused: No    Sexually Abused: No    FAMILY HISTORY: Family History  Problem Relation Age of Onset   Dementia Mother    Heart disease Father     ALLERGIES:  is allergic to diltiazem, penicillins, and tizanidine.  MEDICATIONS:  Current Outpatient Medications  Medication Sig Dispense Refill   B Complex-C (VITAMIN B + C COMPLEX) TABS Take 1 capsule by mouth every morning.     carisoprodol (SOMA) 350 MG tablet Take 350 mg by mouth 2 (two) times daily.      dexamethasone (DECADRON) 4 MG tablet Take 2 tablets (8 mg total) by mouth daily. Start the day after chemotherapy for 2 days. Take with food. 30 tablet 1   lidocaine-prilocaine (EMLA) cream Apply to affected area once 30 g 3   loperamide (IMODIUM) 2 MG capsule Take 1 capsule (2 mg total) by mouth See admin instructions. Initial: 4 mg,the 2 mg every 2 hours (4 mg every 4 hours at night)  maximum: 16 mg/day 60 capsule 2   LORazepam (ATIVAN) 0.5 MG tablet Take 1 tablet (0.5 mg total) by mouth every 8 (eight) hours as needed (nausea). 90 tablet 0   magic mouthwash w/lidocaine SOLN Take 5 mLs by mouth 4 (four) times daily as needed for mouth pain. Sig: Swish/Swallow 5-10 ml four times a day as needed. Dispense 480 ml. 1RF 480 mL 1   Magnesium 500 MG TABS Take 500 mg by mouth as needed.     megestrol (MEGACE) 20 MG tablet Take 1 tablet (20 mg total) by mouth 2 (two) times daily. 60 tablet 1   melatonin (MELATONIN MAXIMUM STRENGTH) 5 MG TABS Take 5 mg by mouth at bedtime as needed.     nystatin (MYCOSTATIN) 100000 UNIT/ML suspension Take 5 mLs (500,000 Units total) by mouth 4 (four) times daily. 473 mL 1   omeprazole (PRILOSEC) 10 MG capsule Take 20 mg by mouth daily.     ondansetron (ZOFRAN) 8 MG tablet Take 1 tablet (8 mg total) by mouth every 8 (eight) hours as needed for nausea or vomiting. Start on the third day after chemotherapy. 90 tablet 1  Oxycodone HCl 10 MG TABS Take 1-2 tablets (10-20 mg total) by mouth every 4 (four) hours as needed (pain). 120 tablet 0   prazosin (MINIPRESS) 1 MG capsule Take 2 mg by mouth at bedtime.     prochlorperazine (COMPAZINE) 10 MG tablet Take 1 tablet (10 mg total) by mouth every 6 (six) hours as needed for nausea or vomiting. 90 tablet 1   rivaroxaban (XARELTO) 20 MG TABS tablet Take 20 mg by mouth daily.     sertraline (ZOLOFT) 50 MG tablet Take 100 mg by mouth at bedtime.     naloxone (NARCAN) nasal spray 4 mg/0.1 mL Place 1 spray into the nose once for 1 dose. Spray half of bottle content into each nostril, then call 911 (Patient not taking: Reported on 12/27/2022) 2 each 0   No current facility-administered medications for this visit.   Facility-Administered Medications Ordered in Other Visits  Medication Dose Route Frequency Provider Last Rate Last Admin   dextrose 5 % solution   Intravenous Once Rickard Patience, MD       fluorouracil (ADRUCIL) 4,000 mg in sodium chloride 0.9 % 70 mL chemo infusion  2,400 mg/m2 (Order-Specific) Intravenous 1 day or 1 dose Rickard Patience, MD   4,000 mg at 02/07/23 1225   sodium chloride flush (NS) 0.9 % injection 10 mL  10 mL Intracatheter PRN Rickard Patience, MD   10 mL at 02/07/23 4098    Review of Systems  Constitutional:  Positive for appetite change, fatigue and unexpected weight change. Negative for chills and fever.  HENT:   Negative for hearing loss and voice change.   Eyes:  Negative for eye problems and icterus.  Respiratory:  Negative for chest tightness, cough and shortness of breath.   Cardiovascular:  Negative for chest pain and leg swelling.  Gastrointestinal:  Negative for abdominal distention and abdominal pain.       Heart burn  Endocrine: Negative for hot flashes.  Genitourinary:  Negative for difficulty urinating, dysuria and frequency.   Musculoskeletal:  Positive for arthralgias, back pain, gait problem and neck pain.  Skin:  Negative for itching and  rash.  Neurological:  Positive for gait problem. Negative for light-headedness and numbness.       Falls.   Hematological:  Negative for adenopathy. Does not bruise/bleed easily.  Psychiatric/Behavioral:  Negative for confusion.      PHYSICAL EXAMINATION: ECOG PERFORMANCE STATUS: 1 - Symptomatic but completely ambulatory  Vitals:   02/07/23 0840  BP: (!) 108/55  Pulse: 89  Resp: 18  Temp: (!) 96.1 F (35.6 C)  SpO2: 98%   Filed Weights   02/07/23 0840  Weight: 128 lb (58.1 kg)    Physical Exam Constitutional:      General: He is not in acute distress.    Appearance: He is ill-appearing. He is not diaphoretic.  HENT:     Head: Normocephalic and atraumatic.  Eyes:     General: No scleral icterus. Cardiovascular:     Rate and Rhythm: Normal rate and regular rhythm.  Pulmonary:     Effort: Pulmonary effort is normal. No respiratory distress.     Breath sounds: No wheezing.  Abdominal:     General: There is no distension.     Palpations: Abdomen is soft.     Tenderness: There is no abdominal tenderness.  Musculoskeletal:        General: Normal range of motion.     Cervical back: Normal range of motion and neck supple.  Skin:    General: Skin is warm and dry.     Findings: No erythema.  Neurological:     Mental Status: He is alert and oriented to person, place, and time. Mental status is at baseline.     Cranial Nerves: No cranial nerve deficit.     Motor: No abnormal muscle tone.  Psychiatric:        Mood and Affect: Mood and affect normal.      LABORATORY DATA:  I have reviewed the data as listed    Latest Ref Rng & Units 02/07/2023    8:08 AM 01/24/2023    8:12 AM 01/14/2023    4:39 AM  CBC  WBC 4.0 - 10.5 K/uL 9.2  8.4  6.2   Hemoglobin 13.0 - 17.0 g/dL 65.7  84.6  96.2   Hematocrit 39.0 - 52.0 % 31.0  32.4  31.4   Platelets 150 - 400 K/uL 207  200  235       Latest Ref Rng & Units 02/07/2023    8:08 AM 01/24/2023    8:12 AM 01/14/2023    4:39 AM   CMP  Glucose 70 - 99 mg/dL 952  841  324   BUN 8 - 23 mg/dL 24  19  21    Creatinine 0.61 - 1.24 mg/dL 4.01  0.27  2.53   Sodium 135 - 145 mmol/L 133  132  135   Potassium 3.5 - 5.1 mmol/L 3.7  3.7  4.4   Chloride 98 - 111 mmol/L 98  99  101   CO2 22 - 32 mmol/L 27  26  27    Calcium 8.9 - 10.3 mg/dL 8.7  8.5  8.7   Total Protein 6.5 - 8.1 g/dL 6.3  6.3    Total Bilirubin 0.3 - 1.2 mg/dL 0.5  0.6    Alkaline Phos 38 - 126 U/L 117  117    AST 15 - 41 U/L 35  34    ALT 0 - 44 U/L 20  20       RADIOGRAPHIC STUDIES: I have personally reviewed the radiological images as listed and agreed with the findings in the report. CT CHEST ABDOMEN PELVIS W CONTRAST  Result Date: 02/03/2023 CLINICAL DATA:  Esophageal cancer restaging * Tracking Code: BO * EXAM: CT CHEST, ABDOMEN, AND PELVIS WITH CONTRAST TECHNIQUE: Multidetector CT imaging of the chest, abdomen and pelvis was performed following the standard protocol during bolus administration of intravenous contrast. RADIATION DOSE REDUCTION: This exam was performed according to the departmental dose-optimization program which includes automated exposure control, adjustment of the mA and/or kV according to patient size and/or use of iterative reconstruction technique. CONTRAST:  OMNIPAQUE IOHEXOL 300 MG/ML  SOLN COMPARISON:  PET-CT, 12/13/2022 FINDINGS: CT CHEST FINDINGS Cardiovascular: Aortic atherosclerosis. Right chest port catheter. Normal heart size. Three-vessel coronary artery calcifications no pericardial effusion. Mediastinum/Nodes: Unchanged prominent subcentimeter mediastinal lymph nodes, likely reactive to fibrosis, not previously FDG avid. Circumferential wall thickening of the lower third of the esophagus, similar to prior examination (series 3, image 46). Trachea and thyroid demonstrate no significant findings. Lungs/Pleura: Moderate centrilobular emphysema. Diffuse bilateral bronchial wall thickening. Mild pulmonary fibrosis in a pattern  with a slight apical to basal gradient, featuring irregular peripheral interstitial opacity, septal thickening, and subpleural bronchiolectasis without clear honeycombing. Unchanged small nodule of the medial dependent left lower lobe measuring 0.3 cm (series 5, image 93). No pleural effusion or pneumothorax. Musculoskeletal: No chest wall abnormality. No acute osseous findings.  CT ABDOMEN PELVIS FINDINGS Hepatobiliary: Numerous bulky, rim enhancing hepatic metastases are seen. Although these are difficult to clearly compare to prior noncontrast, nondiagnostic PET-CT, they are somewhat diminished in size, index lesion of the anterior right lobe of the liver measuring 5.0 x 4.1 cm, previously 6.9 x 4.6 cm (series 3, image 67), large adjacent lesions of the liver dome measuring 6.9 x 6.4 cm, previously 9.2 x 7.5 cm (series 3, image 48). Tiny gallstones. No gallbladder wall thickening, or biliary dilatation. Pancreas: Unremarkable. No pancreatic ductal dilatation or surrounding inflammatory changes. Spleen: Normal in size without significant abnormality. Adrenals/Urinary Tract: Adrenal glands are unremarkable. Kidneys are normal, without renal calculi, solid lesion, or hydronephrosis. Evaluation of the bladder very limited by dense metallic streak artifact from adjacent hip arthroplasty. No obvious abnormality. Stomach/Bowel: Circumferential wall thickening of the gastroesophageal junction and cardia, similar to prior examination (series 3, image 63). Appendix appears normal. No evidence of bowel wall thickening, distention, or inflammatory changes. Vascular/Lymphatic: Aortic atherosclerosis. No enlarged abdominal or pelvic lymph nodes. Reproductive: Evaluation of the prostate very limited by dense metallic streak artifact. No obvious abnormality. Bilateral inguinal testicles. Other: No abdominal wall hernia or abnormality. No ascites. Musculoskeletal: No acute osseous findings. Status post bilateral hip total  arthroplasty dense metallic streak artifact. Unchanged wedge and endplate deformities of T11 through L2 (series 7, image 69). IMPRESSION: 1. Circumferential wall thickening of the lower third of the esophagus and gastric cardia, similar to prior examination, consistent with known primary esophageal malignancy. 2. Numerous bulky, rim enhancing hepatic metastases are seen. Although these are difficult to clearly compare to prior noncontrast, nondiagnostic PET-CT, they are somewhat diminished in size, consistent with treatment response. 3. No other evidence of metastatic disease in the chest, abdomen, or pelvis. 4. Unchanged small nodule of the medial dependent left lower lobe measuring 0.3 cm, almost certainly benign and incidental. Continued attention on follow-up. 5. Mild pulmonary fibrosis in a pattern with a slight apical to basal gradient, featuring irregular peripheral interstitial opacity, septal thickening, and subpleural bronchiolectasis without clear honeycombing. Findings are consistent with probable UIP pattern pulmonary fibrosis. 6. Cholelithiasis. 7. Emphysema. 8. Coronary artery disease. Aortic Atherosclerosis (ICD10-I70.0) and Emphysema (ICD10-J43.9). Electronically Signed   By: Jearld Lesch M.D.   On: 02/03/2023 16:58   CT HEAD WO CONTRAST ( )  Result Date: 01/11/2023 CLINICAL DATA:  Recent fall with headaches and neck pain, initial encounter EXAM: CT HEAD WITHOUT CONTRAST CT CERVICAL SPINE WITHOUT CONTRAST TECHNIQUE: Multidetector CT imaging of the head and cervical spine was performed following the standard protocol without intravenous contrast. Multiplanar CT image reconstructions of the cervical spine were also generated. RADIATION DOSE REDUCTION: This exam was performed according to the departmental dose-optimization program which includes automated exposure control, adjustment of the mA and/or kV according to patient size and/or use of iterative reconstruction technique. COMPARISON:   12/04/2022 FINDINGS: CT HEAD FINDINGS Brain: No evidence of acute infarction, hemorrhage, hydrocephalus, extra-axial collection or mass lesion/mass effect. Chronic atrophic and ischemic changes are again noted. Vascular: No hyperdense vessel or unexpected calcification. Skull: Normal. Negative for fracture or focal lesion. Sinuses/Orbits: No acute finding. Other: None. CT CERVICAL SPINE FINDINGS Alignment: Within normal limits. Skull base and vertebrae: 7 cervical segments are well visualized. Multilevel osteophytic change and facet hypertrophic changes seen. No compression deformity is noted. No acute fracture or acute facet abnormality is seen. Soft tissues and spinal canal: Surrounding soft tissue structures are within normal limits. Upper chest: Visualized lung apices demonstrate mild pleuroparenchymal scarring particularly  on the left. Other: None IMPRESSION: CT of the head: Chronic atrophic and ischemic changes without acute abnormality. CT of cervical spine: Multilevel degenerative change without acute abnormality. Electronically Signed   By: Alcide Clever M.D.   On: 01/11/2023 23:42   CT Cervical Spine Wo Contrast  Result Date: 01/11/2023 CLINICAL DATA:  Recent fall with headaches and neck pain, initial encounter EXAM: CT HEAD WITHOUT CONTRAST CT CERVICAL SPINE WITHOUT CONTRAST TECHNIQUE: Multidetector CT imaging of the head and cervical spine was performed following the standard protocol without intravenous contrast. Multiplanar CT image reconstructions of the cervical spine were also generated. RADIATION DOSE REDUCTION: This exam was performed according to the departmental dose-optimization program which includes automated exposure control, adjustment of the mA and/or kV according to patient size and/or use of iterative reconstruction technique. COMPARISON:  12/04/2022 FINDINGS: CT HEAD FINDINGS Brain: No evidence of acute infarction, hemorrhage, hydrocephalus, extra-axial collection or mass lesion/mass  effect. Chronic atrophic and ischemic changes are again noted. Vascular: No hyperdense vessel or unexpected calcification. Skull: Normal. Negative for fracture or focal lesion. Sinuses/Orbits: No acute finding. Other: None. CT CERVICAL SPINE FINDINGS Alignment: Within normal limits. Skull base and vertebrae: 7 cervical segments are well visualized. Multilevel osteophytic change and facet hypertrophic changes seen. No compression deformity is noted. No acute fracture or acute facet abnormality is seen. Soft tissues and spinal canal: Surrounding soft tissue structures are within normal limits. Upper chest: Visualized lung apices demonstrate mild pleuroparenchymal scarring particularly on the left. Other: None IMPRESSION: CT of the head: Chronic atrophic and ischemic changes without acute abnormality. CT of cervical spine: Multilevel degenerative change without acute abnormality. Electronically Signed   By: Alcide Clever M.D.   On: 01/11/2023 23:42   DG Chest Portable 1 View  Result Date: 01/11/2023 CLINICAL DATA:  Shortness of breath EXAM: PORTABLE CHEST 1 VIEW COMPARISON:  Chest CT 12/04/2022 FINDINGS: Right-sided central venous port tip at the distal SVC. Evidence of chronic lung disease with coarse interstitial pattern. Possible patchy superimposed opacities. Stable cardiomediastinal silhouette. No pneumothorax. IMPRESSION: Chronic lung disease with possible patchy superimposed opacities which may reflect minimal pneumonia, to include atypical organism. Electronically Signed   By: Jasmine Pang M.D.   On: 01/11/2023 23:30

## 2023-02-07 NOTE — Progress Notes (Signed)
Nutrition Follow-up:  Patient with esophageal adenocarcinoma, stage IV with liver involvement.  Patient receiving folfox and transuzumab.    Met with patient and wife in infusion.  Patient very sleepy during visit.  Has been trying to drink 4 boost VHC daily (2120 calories, 88g protein).  Made a shake with boost VHC with strawberry ice cream, whipping creme and yogurt added.  Wife said he ate a little bit of chicken pot pie last night for dinner with extra chicken added.  Wife encouraging patient to eat q 2-3 hours.      Medications: megace, nystatin added today  Labs: reviewed  Anthropometrics:   Weight 128 lb today  127 lb  8 oz on 8/28 131 lb 1.6 oz on 8/14 132 lb on 7/31 135 lb on 7/17 154 lb on 01/19/22    NUTRITION DIAGNOSIS: Inadequate oral intake ongoing    INTERVENTION:  Continue boost VHC, at least 4 per day Encouraged nibbling every 2-3 hours Reviewed with wife [facility] evaluation for fluids if needed between treatments Continue appetite stimulant    MONITORING, EVALUATION, GOAL: weight trends, intake   NEXT VISIT: Wednesday, Sept 25 during infusion  Kamaree Berkel B. Freida Busman, RD, LDN Registered Dietitian [phone]

## 2023-02-07 NOTE — Assessment & Plan Note (Signed)
Magic mouth wash swish and spit.

## 2023-02-07 NOTE — Progress Notes (Signed)
Patient request flu vaccine, confirmed with MD/team ok to administer today

## 2023-02-07 NOTE — Assessment & Plan Note (Signed)
Likely due to decreased oral intake, not well hydration.  Encourage oral hydration  Unsteady gait could be due to borderline BP Need PT/OT

## 2023-02-07 NOTE — Assessment & Plan Note (Addendum)
Image findings and pathology results are reviewed with patient and wife.  PET scan results were reviewed and discussed with patient.  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  NGS positive for HER2 copy again. Labs are reviewed and discussed with patient. Proceed with 5-FU pump and Transtuzumab., hold off oxaliplatin and bolus 5-FU due to decreased PS Repeat MUGA in early Oct. Still has dysphagia, will ask GI Dr. Allegra Lai to evaluate him for cryotherapy.

## 2023-02-08 ENCOUNTER — Other Ambulatory Visit: Payer: Self-pay | Admitting: *Deleted

## 2023-02-08 LAB — CEA: CEA: 69.8 ng/mL — ABNORMAL HIGH (ref 0.0–4.7)

## 2023-02-08 MED ORDER — OXYCODONE HCL 10 MG PO TABS: 10.0000 mg | ORAL_TABLET | ORAL | 0 refills | Status: DC | PRN

## 2023-02-09 ENCOUNTER — Inpatient Hospital Stay: Payer: Medicare Other

## 2023-02-09 ENCOUNTER — Telehealth: Payer: Self-pay

## 2023-02-09 VITALS — BP 107/59 | HR 92 | Resp 18

## 2023-02-09 DIAGNOSIS — Z5112 Encounter for antineoplastic immunotherapy: Secondary | ICD-10-CM | POA: Diagnosis not present

## 2023-02-09 DIAGNOSIS — C159 Malignant neoplasm of esophagus, unspecified: Secondary | ICD-10-CM

## 2023-02-09 MED ORDER — SODIUM CHLORIDE 0.9% FLUSH
10.0000 mL | INTRAVENOUS | Status: DC | PRN
  Administered 2023-02-09: 10 mL
  Filled 2023-02-09: qty 10

## 2023-02-09 MED ORDER — HEPARIN SOD (PORK) LOCK FLUSH 100 UNIT/ML IV SOLN
500.0000 [IU] | Freq: Once | INTRAVENOUS | Status: AC | PRN
  Administered 2023-02-09: 500 [IU]
  Filled 2023-02-09: qty 5

## 2023-02-09 NOTE — Telephone Encounter (Signed)
-----   Message from [facility] sent at 02/09/2023  7:54 AM EDT ----- Aaron Short  Please arrange clinic visit to discuss about his dysphagia. Ok to work him in in next 2weeks  RV ----- Message ----- From: Rickard Patience, MD Sent: 02/07/2023   9:04 AM EDT To: Toney Reil, MD  Dr. Allegra Lai,  Pt has partial response to current regimen. He still has swallowing difficulty and is interested in cryotherapy and has questions. Would you please have discussion with him and arrange the procedure? Thank you  Rickard Patience

## 2023-02-09 NOTE — Telephone Encounter (Signed)
Called patient and patient wife Aaron Short answer and there was a signed DPR signed that we could give information to her. Made appointment for 02/21/2023 with Dr. Allegra Lai at 3:15pm

## 2023-02-14 ENCOUNTER — Other Ambulatory Visit: Payer: Self-pay

## 2023-02-14 ENCOUNTER — Telehealth: Payer: Self-pay | Admitting: *Deleted

## 2023-02-14 NOTE — Telephone Encounter (Signed)
I called and spoke with patient wife and patient prefers to come tomorrow, I transferred her to scheduler for a time

## 2023-02-14 NOTE — Telephone Encounter (Signed)
Patient wife called and left message  asking for patient to be seen in [facility] stating she thinks he is dehydrated and had been told he could see a NP if needed between infusions. I called back and asked for return call for more info on his symptoms.

## 2023-02-14 NOTE — Telephone Encounter (Signed)
Not my patient

## 2023-02-15 ENCOUNTER — Other Ambulatory Visit: Payer: Self-pay | Admitting: Oncology

## 2023-02-15 ENCOUNTER — Encounter: Payer: Self-pay | Admitting: *Deleted

## 2023-02-15 ENCOUNTER — Other Ambulatory Visit: Payer: Self-pay

## 2023-02-15 ENCOUNTER — Other Ambulatory Visit: Payer: Self-pay | Admitting: *Deleted

## 2023-02-15 ENCOUNTER — Inpatient Hospital Stay: Payer: Medicare Other

## 2023-02-15 ENCOUNTER — Ambulatory Visit: Payer: Medicare Other

## 2023-02-15 ENCOUNTER — Inpatient Hospital Stay (HOSPITAL_BASED_OUTPATIENT_CLINIC_OR_DEPARTMENT_OTHER): Payer: Medicare Other | Admitting: Hospice and Palliative Medicine

## 2023-02-15 ENCOUNTER — Inpatient Hospital Stay: Payer: Medicare Other | Admitting: Hospice and Palliative Medicine

## 2023-02-15 VITALS — BP 109/91 | HR 100 | Temp 97.6°F | Resp 18 | Wt 125.8 lb

## 2023-02-15 DIAGNOSIS — C159 Malignant neoplasm of esophagus, unspecified: Secondary | ICD-10-CM

## 2023-02-15 DIAGNOSIS — G893 Neoplasm related pain (acute) (chronic): Secondary | ICD-10-CM

## 2023-02-15 DIAGNOSIS — E860 Dehydration: Secondary | ICD-10-CM

## 2023-02-15 DIAGNOSIS — Z5112 Encounter for antineoplastic immunotherapy: Secondary | ICD-10-CM | POA: Diagnosis not present

## 2023-02-15 LAB — CMP (CANCER CENTER ONLY)
ALT: 21 U/L (ref 0–44)
AST: 31 U/L (ref 15–41)
Albumin: 3.1 g/dL — ABNORMAL LOW (ref 3.5–5.0)
Alkaline Phosphatase: 123 U/L (ref 38–126)
Anion gap: 12 (ref 5–15)
BUN: 26 mg/dL — ABNORMAL HIGH (ref 8–23)
CO2: 25 mmol/L (ref 22–32)
Calcium: 9.1 mg/dL (ref 8.9–10.3)
Chloride: 99 mmol/L (ref 98–111)
Creatinine: 0.71 mg/dL (ref 0.61–1.24)
GFR, Estimated: >60 mL/min (ref >60)
Glucose, Bld: 169 mg/dL — ABNORMAL HIGH (ref 70–99)
Potassium: 3.6 mmol/L (ref 3.5–5.1)
Sodium: 136 mmol/L (ref 135–145)
Total Bilirubin: 0.7 mg/dL (ref 0.3–1.2)
Total Protein: 6.3 g/dL — ABNORMAL LOW (ref 6.5–8.1)

## 2023-02-15 LAB — CBC WITH DIFFERENTIAL (CANCER CENTER ONLY)
Abs Immature Granulocytes: 0.07 10*3/uL (ref 0.00–0.07)
Basophils Absolute: 0.1 10*3/uL (ref 0.0–0.1)
Basophils Relative: 1 %
Eosinophils Absolute: 0.2 10*3/uL (ref 0.0–0.5)
Eosinophils Relative: 2 %
HCT: 31.2 % — ABNORMAL LOW (ref 39.0–52.0)
Hemoglobin: 10.6 g/dL — ABNORMAL LOW (ref 13.0–17.0)
Immature Granulocytes: 1 %
Lymphocytes Relative: 27 %
Lymphs Abs: 2.5 10*3/uL (ref 0.7–4.0)
MCH: 34.5 pg — ABNORMAL HIGH (ref 26.0–34.0)
MCHC: 34 g/dL (ref 30.0–36.0)
MCV: 101.6 fL — ABNORMAL HIGH (ref 80.0–100.0)
Monocytes Absolute: 1.1 10*3/uL — ABNORMAL HIGH (ref 0.1–1.0)
Monocytes Relative: 12 %
Neutro Abs: 5.4 10*3/uL (ref 1.7–7.7)
Neutrophils Relative %: 57 %
Platelet Count: 315 10*3/uL (ref 150–400)
RBC: 3.07 MIL/uL — ABNORMAL LOW (ref 4.22–5.81)
RDW: 17.6 % — ABNORMAL HIGH (ref 11.5–15.5)
WBC Count: 9.2 10*3/uL (ref 4.0–10.5)
nRBC: 0 % (ref 0.0–0.2)

## 2023-02-15 LAB — MAGNESIUM: Magnesium: 1.9 mg/dL (ref 1.7–2.4)

## 2023-02-15 MED ORDER — LORAZEPAM 0.5 MG PO TABS: 0.5000 mg | ORAL_TABLET | Freq: Three times a day (TID) | ORAL | 0 refills | Status: DC | PRN

## 2023-02-15 MED ORDER — HYDROMORPHONE HCL 2 MG PO TABS: 2.0000 mg | ORAL_TABLET | ORAL | 0 refills | Status: DC | PRN

## 2023-02-15 MED ORDER — DRONABINOL 2.5 MG PO CAPS: 2.5000 mg | ORAL_CAPSULE | Freq: Two times a day (BID) | ORAL | 0 refills | Status: AC

## 2023-02-15 MED ORDER — HEPARIN SOD (PORK) LOCK FLUSH 100 UNIT/ML IV SOLN
500.0000 [IU] | Freq: Once | INTRAVENOUS | Status: AC
  Administered 2023-02-15: 500 [IU] via INTRAVENOUS
  Filled 2023-02-15: qty 5

## 2023-02-15 MED ORDER — SODIUM CHLORIDE 0.9 % IV SOLN
INTRAVENOUS | Status: DC
  Filled 2023-02-15 (×2): qty 250

## 2023-02-15 NOTE — Progress Notes (Signed)
error 

## 2023-02-15 NOTE — Progress Notes (Signed)
[facility]pt[facility] [phone] [phone]  Patient Care Team: Rosemarie Ax, MD as PCP - General Benita Gutter, RN as Oncology Nurse Navigator Rickard Patience, MD as Consulting Physician (Oncology)   NAME OF PATIENT: Aaron Short  [id number]  [birth year]   DATE OF VISIT: 02/15/23  REASON FOR CONSULT: Aaron Short is a 77 y.o. 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   INTERVAL HISTORY: Patient last saw Dr. Cathie Hoops on 02/07/2023 at which time decision was made to hold oxaliplatin and reduced dose 5-FU due to decreased performance status.  Patient received cycle 6.  Patient requested [facility] visit today with concerns that he is dehydrated.  Patient reports poor oral intake.  Mostly only drinking ensures with very little oral intake otherwise.  Performance status has declined and he is mostly sedentary.  Wife reports that patient is developing a small sacral pressure ulcer.  Patient endorses chronic nausea but no vomiting.  He also endorses chronic generalized pain now poorly controlled on oxycodone.  Patient says that he is taking up to 40 mg of oxycodone at a time with no improvement in pain.  He denies any adverse effects from pain medication.  Of note, he has been on oxycodone for decades.  Denies any neurologic complaints. Denies recent fevers or illnesses. Denies any easy bleeding or bruising. Denies chest pain. Denies any , vomiting, constipation, or diarrhea. Denies urinary complaints. Patient offers no further specific complaints today.   PAST MEDICAL HISTORY: Past Medical History:  Diagnosis Date   Arthralgia of lower leg 04/02/2012   Arthralgia of upper arm 06/18/2009   Cancer (HCC)    Diabetes mellitus without complication (HCC)    FH: atrial fibrillation    Hypertension     PAST SURGICAL HISTORY:  Past  Surgical History:  Procedure Laterality Date   bladder cancer surgery  2015   ESOPHAGOGASTRODUODENOSCOPY (EGD) WITH PROPOFOL N/A 10/30/2022   Procedure: ESOPHAGOGASTRODUODENOSCOPY (EGD) WITH PROPOFOL;  Surgeon: Toney Reil, MD;  Location: [facility] ENDOSCOPY;  Service: Gastroenterology;  Laterality: N/A;   IR IMAGING GUIDED PORT INSERTION  11/13/2022   JOINT REPLACEMENT     REPLACEMENT TOTAL HIP W/  RESURFACING IMPLANTS Bilateral    SHOULDER SURGERY Bilateral    TOTAL KNEE ARTHROPLASTY Bilateral     HEMATOLOGY/ONCOLOGY HISTORY:  Oncology History  Malignant neoplasm of urinary bladder (HCC) (Resolved)  10/23/2013 Initial Diagnosis   Malignant neoplasm of urinary bladder (HCC)   11/03/2022 Cancer Staging   Staging form: Urinary Bladder, AJCC 7th Edition - Clinical: Stage 0a (Ta, N0, M0) - Signed by Rickard Patience, MD on 11/03/2022   Esophageal adenocarcinoma (HCC)  10/20/2022 Imaging   CT abdomen pelvis w contrast  -Multiple bilobar hepatic lesions extending throughout most of the liver parenchyma and concerning for metastatic disease. Correlation with tissue sampling could be considered for definitive pathologic diagnosis.   - Circumferential wall thickening of the distal esophagus and gastroesophageal junction. Recommend further evaluation with endoscopy to exclude underlying malignancy. No other evidence of primary malignancy in the abdomen or pelvis although note is made that evaluation of the pelvic structures is very limited in this CT scan due to extensive artifact secondary to the patient's bilateral total hip arthroplasty.   - Cystic lesion in the pancreatic tail measuring up to 1.0 cm, possibly a sidebranch IPMN. No obvious pancreatic ductal dilatation. Further evaluation with MRI/MRCP could be considered.   -  Extensive degenerative changes to the spine with compression deformities of T11, T12 and L2, similar to prior.    10/20/2022 Imaging   CT chest w contrast   New 4 mm nodule in the  posterior segment of left lower lobe, 3 mm nodule in the left upper chest of pulmonary metastasis.   Stable mild centrilobular emphysema and mild peripheral reticulation which may reflect smoking-related lung fibrosis.   Mildly dilated esophagus with air-fluid level and circumferential thickening in the distal esophagus which may reflect chronic esophagitis. Consider endoscopic evaluation.    11/03/2022 Initial Diagnosis   Esophageal adenocarcinoma   + dysphagia for both liquid and solid food, cough after eating. Unintentional weight loss 50 pounds over the past years, 14 pounds within last year.   10/30/22 EGD showed A large, submucosal mass with no bleeding and stigmata of recent bleeding was found in the lower third of the esophagus, 35 to 40 cm from the incisors. The mass was partially obstructing and circumferential. Biopsies were taken with a cold forceps for histology.  Pathology showed moderately differentiated adenocarcinoma involving squamocolumnar junctional mucosa with focal intestinal metaplasia.   [facility] NGS showed ERBB2 copy number gain, TP53 stop gain, NSD1 frameshift, RARA copy number gain, TOP2A copy number gain. TMB 7.9 m/mb, MSI stable.    11/03/2022 Cancer Staging   Staging form: Esophagus - Adenocarcinoma, AJCC 8th Edition - Clinical stage from 11/03/2022: Stage IVB (cTX, cNX, cM1) - Signed by Rickard Patience, MD on 11/03/2022 Stage prefix: Initial diagnosis   11/13/2022 Imaging   PET scan showed 1. Tracer avid tumor is identified within the distal esophagus extending into the cardia. 2. Extensive tracer avid liver metastases. 3. Tiny nodule within the medial left lower lobe is too small to characterize by PET-CT measuring 4 mm. Attention to this nodule on future surveillance imaging is advised. 4. Asymmetric uptake localizing to the right first rib costosternal junction and left AC joint is noted. Findings are favored to represent arthropathic change 5. Gallstones. 6.  Aortic  Atherosclerosis   11/13/2022 Procedure   Mediport placement by IR   11/15/2022 - 11/17/2022 Chemotherapy   Patient is on Treatment Plan : GASTROESOPHAGEAL FOLFOX q14d x 6 cycles     11/29/2022 -  Chemotherapy   Patient is on Treatment Plan : GASTROESOPHAGEAL Trastuzumab (6/4) D1 + FOLFOX D1 q14d x 12 cycles / Trastuzumab q14d     01/10/2023 - 01/14/2023 Hospital Admission   hospitalization after a fall, elbow skin abrasion, pneumonia.  Was treated with IV antibiotics, discharged home with a course of oral antibiotics   02/03/2023 Imaging   1. Circumferential wall thickening of the lower third of the esophagus and gastric cardia, similar to prior examination, consistent with known primary esophageal malignancy. 2. Numerous bulky, rim enhancing hepatic metastases are seen. Although these are difficult to clearly compare to prior noncontrast, nondiagnostic PET-CT, they are somewhat diminished in size, consistent with treatment response. 3. No other evidence of metastatic disease in the chest, abdomen, or pelvis. 4. Unchanged small nodule of the medial dependent left lower lobe measuring 0.3 cm, almost certainly benign and incidental. Continued attention on follow-up. 5. Mild pulmonary fibrosis in a pattern with a slight apical to basal gradient, featuring irregular peripheral interstitial opacity, septal thickening, and subpleural bronchiolectasis without clear honeycombing. Findings are consistent with probable UIP pattern pulmonary fibrosis. 6. Cholelithiasis. 7. Emphysema. 8. Coronary artery disease.   Aortic Atherosclerosis (ICD10-I70.0) and Emphysema (ICD10-J43.9)     ALLERGIES:  is allergic to diltiazem, penicillins,  and tizanidine.  MEDICATIONS:  Current Outpatient Medications  Medication Sig Dispense Refill   B Complex-C (VITAMIN B + C COMPLEX) TABS Take 1 capsule by mouth every morning.     carisoprodol (SOMA) 350 MG tablet Take 350 mg by mouth 2 (two) times daily.       dexamethasone (DECADRON) 4 MG tablet Take 2 tablets (8 mg total) by mouth daily. Start the day after chemotherapy for 2 days. Take with food. 30 tablet 1   lidocaine-prilocaine (EMLA) cream Apply to affected area once 30 g 3   loperamide (IMODIUM) 2 MG capsule Take 1 capsule (2 mg total) by mouth See admin instructions. Initial: 4 mg,the 2 mg every 2 hours (4 mg every 4 hours at night)  maximum: 16 mg/day 60 capsule 2   LORazepam (ATIVAN) 0.5 MG tablet Take 1 tablet (0.5 mg total) by mouth every 8 (eight) hours as needed (nausea). 90 tablet 0   magic mouthwash w/lidocaine SOLN Take 5 mLs by mouth 4 (four) times daily as needed for mouth pain. Sig: Swish/Swallow 5-10 ml four times a day as needed. Dispense 480 ml. 1RF 480 mL 1   Magnesium 500 MG TABS Take 500 mg by mouth as needed.     megestrol (MEGACE) 20 MG tablet Take 1 tablet (20 mg total) by mouth 2 (two) times daily. 60 tablet 1   melatonin (MELATONIN MAXIMUM STRENGTH) 5 MG TABS Take 5 mg by mouth at bedtime as needed.     naloxone (NARCAN) nasal spray 4 mg/0.1 mL Place 1 spray into the nose once for 1 dose. Spray half of bottle content into each nostril, then call 911 (Patient not taking: Reported on 12/27/2022) 2 each 0   nystatin (MYCOSTATIN) 100000 UNIT/ML suspension Take 5 mLs (500,000 Units total) by mouth 4 (four) times daily. 473 mL 1   omeprazole (PRILOSEC) 10 MG capsule Take 20 mg by mouth daily.     ondansetron (ZOFRAN) 8 MG tablet Take 1 tablet (8 mg total) by mouth every 8 (eight) hours as needed for nausea or vomiting. Start on the third day after chemotherapy. 90 tablet 1   Oxycodone HCl 10 MG TABS Take 1-2 tablets (10-20 mg total) by mouth every 4 (four) hours as needed (pain). 120 tablet 0   prazosin (MINIPRESS) 1 MG capsule Take 2 mg by mouth at bedtime.     prochlorperazine (COMPAZINE) 10 MG tablet Take 1 tablet (10 mg total) by mouth every 6 (six) hours as needed for nausea or vomiting. 90 tablet 1   rivaroxaban (XARELTO) 20  MG TABS tablet Take 20 mg by mouth daily.     sertraline (ZOLOFT) 50 MG tablet Take 100 mg by mouth at bedtime.     No current facility-administered medications for this visit.    VITAL SIGNS: There were no vitals taken for this visit. There were no vitals filed for this visit.  Estimated body mass index is 19.46 kg/m as calculated from the following:   Height as of 01/12/23: 5\' 8"  (1.727 m).   Weight as of 02/07/23: 128 lb (58.1 kg).  LABS: CBC:    Component Value Date/Time   WBC 9.2 02/07/2023 0808   WBC 6.2 01/14/2023 0439   HGB 10.5 (L) 02/07/2023 0808   HCT 31.0 (L) 02/07/2023 0808   PLT 207 02/07/2023 0808   MCV 102.0 (H) 02/07/2023 0808   NEUTROABS 5.6 02/07/2023 0808   LYMPHSABS 2.3 02/07/2023 0808   MONOABS 1.1 (H) 02/07/2023 0808   EOSABS  0.1 02/07/2023 0808   BASOSABS 0.0 02/07/2023 0808   Comprehensive Metabolic Panel:    Component Value Date/Time   NA 133 (L) 02/07/2023 0808   NA 141 02/10/2019 1408   K 3.7 02/07/2023 0808   CL 98 02/07/2023 0808   CO2 27 02/07/2023 0808   BUN 24 (H) 02/07/2023 0808   BUN 11 02/10/2019 1408   CREATININE 0.70 02/07/2023 0808   GLUCOSE 145 (H) 02/07/2023 0808   CALCIUM 8.7 (L) 02/07/2023 0808   AST 35 02/07/2023 0808   ALT 20 02/07/2023 0808   ALKPHOS 117 02/07/2023 0808   BILITOT 0.5 02/07/2023 0808   PROT 6.3 (L) 02/07/2023 0808   PROT 6.4 02/10/2019 1408   ALBUMIN 3.1 (L) 02/07/2023 0808   ALBUMIN 4.0 02/10/2019 1408    RADIOGRAPHIC STUDIES: CT CHEST ABDOMEN PELVIS W CONTRAST  Result Date: 02/03/2023 CLINICAL DATA:  Esophageal cancer restaging * Tracking Code: BO * EXAM: CT CHEST, ABDOMEN, AND PELVIS WITH CONTRAST TECHNIQUE: Multidetector CT imaging of the chest, abdomen and pelvis was performed following the standard protocol during bolus administration of intravenous contrast. RADIATION DOSE REDUCTION: This exam was performed according to the departmental dose-optimization program which includes automated exposure  control, adjustment of the mA and/or kV according to patient size and/or use of iterative reconstruction technique. CONTRAST:  OMNIPAQUE IOHEXOL 300 MG/ML  SOLN COMPARISON:  PET-CT, 12/13/2022 FINDINGS: CT CHEST FINDINGS Cardiovascular: Aortic atherosclerosis. Right chest port catheter. Normal heart size. Three-vessel coronary artery calcifications no pericardial effusion. Mediastinum/Nodes: Unchanged prominent subcentimeter mediastinal lymph nodes, likely reactive to fibrosis, not previously FDG avid. Circumferential wall thickening of the lower third of the esophagus, similar to prior examination (series 3, image 46). Trachea and thyroid demonstrate no significant findings. Lungs/Pleura: Moderate centrilobular emphysema. Diffuse bilateral bronchial wall thickening. Mild pulmonary fibrosis in a pattern with a slight apical to basal gradient, featuring irregular peripheral interstitial opacity, septal thickening, and subpleural bronchiolectasis without clear honeycombing. Unchanged small nodule of the medial dependent left lower lobe measuring 0.3 cm (series 5, image 93). No pleural effusion or pneumothorax. Musculoskeletal: No chest wall abnormality. No acute osseous findings. CT ABDOMEN PELVIS FINDINGS Hepatobiliary: Numerous bulky, rim enhancing hepatic metastases are seen. Although these are difficult to clearly compare to prior noncontrast, nondiagnostic PET-CT, they are somewhat diminished in size, index lesion of the anterior right lobe of the liver measuring 5.0 x 4.1 cm, previously 6.9 x 4.6 cm (series 3, image 67), large adjacent lesions of the liver dome measuring 6.9 x 6.4 cm, previously 9.2 x 7.5 cm (series 3, image 48). Tiny gallstones. No gallbladder wall thickening, or biliary dilatation. Pancreas: Unremarkable. No pancreatic ductal dilatation or surrounding inflammatory changes. Spleen: Normal in size without significant abnormality. Adrenals/Urinary Tract: Adrenal glands are unremarkable.  Kidneys are normal, without renal calculi, solid lesion, or hydronephrosis. Evaluation of the bladder very limited by dense metallic streak artifact from adjacent hip arthroplasty. No obvious abnormality. Stomach/Bowel: Circumferential wall thickening of the gastroesophageal junction and cardia, similar to prior examination (series 3, image 63). Appendix appears normal. No evidence of bowel wall thickening, distention, or inflammatory changes. Vascular/Lymphatic: Aortic atherosclerosis. No enlarged abdominal or pelvic lymph nodes. Reproductive: Evaluation of the prostate very limited by dense metallic streak artifact. No obvious abnormality. Bilateral inguinal testicles. Other: No abdominal wall hernia or abnormality. No ascites. Musculoskeletal: No acute osseous findings. Status post bilateral hip total arthroplasty dense metallic streak artifact. Unchanged wedge and endplate deformities of T11 through L2 (series 7, image 69). IMPRESSION: 1.  Circumferential wall thickening of the lower third of the esophagus and gastric cardia, similar to prior examination, consistent with known primary esophageal malignancy. 2. Numerous bulky, rim enhancing hepatic metastases are seen. Although these are difficult to clearly compare to prior noncontrast, nondiagnostic PET-CT, they are somewhat diminished in size, consistent with treatment response. 3. No other evidence of metastatic disease in the chest, abdomen, or pelvis. 4. Unchanged small nodule of the medial dependent left lower lobe measuring 0.3 cm, almost certainly benign and incidental. Continued attention on follow-up. 5. Mild pulmonary fibrosis in a pattern with a slight apical to basal gradient, featuring irregular peripheral interstitial opacity, septal thickening, and subpleural bronchiolectasis without clear honeycombing. Findings are consistent with probable UIP pattern pulmonary fibrosis. 6. Cholelithiasis. 7. Emphysema. 8. Coronary artery disease. Aortic  Atherosclerosis (ICD10-I70.0) and Emphysema (ICD10-J43.9). Electronically Signed   By: Jearld Lesch M.D.   On: 02/03/2023 16:58    PERFORMANCE STATUS (ECOG) : 2 - Symptomatic, <50% confined to bed  Review of Systems Unless otherwise noted, a complete review of systems is negative.  Physical Exam General: NAD, thin and frail appearing Cardiovascular: irregular Pulmonary: clear ant fields Abdomen: soft, nontender, + bowel sounds GU: no suprapubic tenderness Extremities: no edema, no joint deformities Skin: no rashes Neurological: Weakness but otherwise nonfocal  IMPRESSION/PLAN: Esophageal cancer-on dose reduced 5-FU.  Discussed with Dr. Cathie Hoops as patient may benefit from treatment holiday given declining performance status.  Patient states that he is "tired of living this way."  Neoplasm related pain/chronic pain -patient has found little benefit with previous long-acting opioids and is no longer finding oxycodone effective.  Will rotate to hydromorphone 2 to 4 mg every 4 hours as needed #60  Dehydration -proceed with IV fluids today  Oral intake/nausea -continue antiemetics.  Will trial Marinol 2.5 mg twice daily  Case and plan discussed with Dr. Cathie Hoops.  Patient will return next week to see Dr. Cathie Hoops.  Follow-up telephone visit with me in 2 to 3 weeks   Patient expressed understanding and was in agreement with this plan. He also understands that He can call clinic at any time with any questions, concerns, or complaints.   Thank you for allowing me to participate in the care of this very pleasant patient.   Time Total: 20 minutes  Visit consisted of counseling and education dealing with the complex and emotionally intense issues of symptom management in the setting of serious illness.Greater than 50%  of this time was spent counseling and coordinating care related to the above assessment and plan.  Signed by: Laurette Schimke, PhD, NP-C

## 2023-02-15 NOTE — Telephone Encounter (Signed)
Aaron Short called reporting that [facility] in [place] does not have the Hydromorphone ordered today so she called around and [facility] in [place] does have it and she asks that prescription be sent there

## 2023-02-16 ENCOUNTER — Telehealth: Payer: Self-pay | Admitting: *Deleted

## 2023-02-16 NOTE — Telephone Encounter (Signed)
PA submitted for Aaron Short (Key: [id number]) droNABinol 2.5MG  capsules [employer] Part D Electronic Prior Authorization Form (2017 NCPDP)

## 2023-02-16 NOTE — Telephone Encounter (Signed)
Aaron Short (Key: [id number]) [phone] droNABinol 2.5MG  capsules status: PA Response - ApprovedCreated: September 19th, 2024 [phone]: September 20th, 2024

## 2023-02-21 ENCOUNTER — Other Ambulatory Visit: Payer: Self-pay

## 2023-02-21 ENCOUNTER — Inpatient Hospital Stay: Payer: Medicare Other

## 2023-02-21 ENCOUNTER — Encounter: Payer: Self-pay | Admitting: Hospice and Palliative Medicine

## 2023-02-21 ENCOUNTER — Encounter: Payer: Self-pay | Admitting: Gastroenterology

## 2023-02-21 ENCOUNTER — Ambulatory Visit (INDEPENDENT_AMBULATORY_CARE_PROVIDER_SITE_OTHER): Payer: Medicare Other | Admitting: Gastroenterology

## 2023-02-21 ENCOUNTER — Inpatient Hospital Stay: Payer: Medicare Other | Admitting: Oncology

## 2023-02-21 ENCOUNTER — Inpatient Hospital Stay (HOSPITAL_BASED_OUTPATIENT_CLINIC_OR_DEPARTMENT_OTHER): Payer: Medicare Other | Admitting: Hospice and Palliative Medicine

## 2023-02-21 VITALS — BP 117/52 | HR 67 | Temp 96.7°F | Resp 18 | Ht 68.0 in | Wt 128.0 lb

## 2023-02-21 VITALS — BP 114/71 | HR 67 | Temp 97.9°F | Ht 68.0 in | Wt 130.5 lb

## 2023-02-21 DIAGNOSIS — E860 Dehydration: Secondary | ICD-10-CM

## 2023-02-21 DIAGNOSIS — C159 Malignant neoplasm of esophagus, unspecified: Secondary | ICD-10-CM

## 2023-02-21 DIAGNOSIS — S41112A Laceration without foreign body of left upper arm, initial encounter: Secondary | ICD-10-CM

## 2023-02-21 DIAGNOSIS — W19XXXA Unspecified fall, initial encounter: Secondary | ICD-10-CM

## 2023-02-21 DIAGNOSIS — G893 Neoplasm related pain (acute) (chronic): Secondary | ICD-10-CM | POA: Diagnosis not present

## 2023-02-21 DIAGNOSIS — R531 Weakness: Secondary | ICD-10-CM

## 2023-02-21 DIAGNOSIS — Z5112 Encounter for antineoplastic immunotherapy: Secondary | ICD-10-CM | POA: Diagnosis not present

## 2023-02-21 LAB — CMP (CANCER CENTER ONLY)
ALT: 17 U/L (ref 0–44)
AST: 37 U/L (ref 15–41)
Albumin: 3.1 g/dL — ABNORMAL LOW (ref 3.5–5.0)
Alkaline Phosphatase: 131 U/L — ABNORMAL HIGH (ref 38–126)
Anion gap: 9 (ref 5–15)
BUN: 17 mg/dL (ref 8–23)
CO2: 25 mmol/L (ref 22–32)
Calcium: 9 mg/dL (ref 8.9–10.3)
Chloride: 104 mmol/L (ref 98–111)
Creatinine: 0.64 mg/dL (ref 0.61–1.24)
GFR, Estimated: >60 mL/min (ref >60)
Glucose, Bld: 153 mg/dL — ABNORMAL HIGH (ref 70–99)
Potassium: 3.6 mmol/L (ref 3.5–5.1)
Sodium: 138 mmol/L (ref 135–145)
Total Bilirubin: 0.6 mg/dL (ref 0.3–1.2)
Total Protein: 6.1 g/dL — ABNORMAL LOW (ref 6.5–8.1)

## 2023-02-21 LAB — CBC WITH DIFFERENTIAL (CANCER CENTER ONLY)
Abs Immature Granulocytes: 0.03 10*3/uL (ref 0.00–0.07)
Basophils Absolute: 0 10*3/uL (ref 0.0–0.1)
Basophils Relative: 0 %
Eosinophils Absolute: 0.1 10*3/uL (ref 0.0–0.5)
Eosinophils Relative: 1 %
HCT: 30.6 % — ABNORMAL LOW (ref 39.0–52.0)
Hemoglobin: 10.2 g/dL — ABNORMAL LOW (ref 13.0–17.0)
Immature Granulocytes: 0 %
Lymphocytes Relative: 11 %
Lymphs Abs: 1.3 10*3/uL (ref 0.7–4.0)
MCH: 34.7 pg — ABNORMAL HIGH (ref 26.0–34.0)
MCHC: 33.3 g/dL (ref 30.0–36.0)
MCV: 104.1 fL — ABNORMAL HIGH (ref 80.0–100.0)
Monocytes Absolute: 1.1 10*3/uL — ABNORMAL HIGH (ref 0.1–1.0)
Monocytes Relative: 10 %
Neutro Abs: 8.8 10*3/uL — ABNORMAL HIGH (ref 1.7–7.7)
Neutrophils Relative %: 78 %
Platelet Count: 263 10*3/uL (ref 150–400)
RBC: 2.94 MIL/uL — ABNORMAL LOW (ref 4.22–5.81)
RDW: 17.7 % — ABNORMAL HIGH (ref 11.5–15.5)
WBC Count: 11.4 10*3/uL — ABNORMAL HIGH (ref 4.0–10.5)
nRBC: 0 % (ref 0.0–0.2)

## 2023-02-21 LAB — MAGNESIUM: Magnesium: 1.8 mg/dL (ref 1.7–2.4)

## 2023-02-21 MED ORDER — HEPARIN SOD (PORK) LOCK FLUSH 100 UNIT/ML IV SOLN
500.0000 [IU] | Freq: Once | INTRAVENOUS | Status: AC
  Administered 2023-02-21: 500 [IU] via INTRAVENOUS
  Filled 2023-02-21: qty 5

## 2023-02-21 MED ORDER — SODIUM CHLORIDE 0.9% FLUSH
10.0000 mL | Freq: Once | INTRAVENOUS | Status: AC
  Administered 2023-02-21: 10 mL via INTRAVENOUS
  Filled 2023-02-21: qty 10

## 2023-02-21 MED ORDER — SODIUM CHLORIDE 0.9 % IV SOLN
INTRAVENOUS | Status: DC
  Filled 2023-02-21 (×2): qty 250

## 2023-02-21 NOTE — Progress Notes (Signed)
Aaron [name], MD [address]  [address]  [place], [place] [address]  Main: [phone]  Fax: [phone]    Gastroenterology Consultation  Referring Provider:     Rosemarie Ax, MD Primary Care Physician:  Aaron Ax, MD Primary Gastroenterologist:  Dr. Arlyss Short Reason for Consultation: Metastatic esophageal cancer, dysphagia        HPI:   Aaron Short is a 77 y.o. male referred by Dr. Rosemarie Ax, MD  for consultation & management of metastatic esophageal adenocarcinoma with extensive liver involvement on dose reduced 5 FU.  Patient is currently on treatment holiday due to declining performance status.  Dr. Cathie Hoops notified me to evaluate this patient for cryotherapy of esophageal cancer because of dysphagia and poor p.o. intake.  According to patient's wife who accompanied him today stated that his appetite has picked up since switching to Marinol last week has been eating more and gained a pound.  He also states that he does not notice food sitting in his chest.  NSAIDs: None  Antiplts/Anticoagulants/Anti thrombotics: Xarelto  GI Procedures:  Upper endoscopy 10/30/2022 for dysphagia and weight loss 10/30/22 EGD showed A large, submucosal mass with no bleeding and stigmata of recent bleeding was found in the lower third of the esophagus, 35 to 40 cm from the incisors. The mass was partially obstructing and circumferential. Biopsies were taken with a cold forceps for histology.   Pathology showed moderately differentiated adenocarcinoma involving squamocolumnar junctional mucosa with focal intestinal metaplasia.  Past Medical History:  Diagnosis Date   Arthralgia of lower leg 04/02/2012   Arthralgia of upper arm 06/18/2009   Cancer (HCC)    Diabetes mellitus without complication (HCC)    FH: atrial fibrillation    Hypertension     Past Surgical History:  Procedure Laterality Date   bladder cancer surgery  2015    ESOPHAGOGASTRODUODENOSCOPY (EGD) WITH PROPOFOL N/A 10/30/2022   Procedure: ESOPHAGOGASTRODUODENOSCOPY (EGD) WITH PROPOFOL;  Surgeon: Toney Reil, MD;  Location: [facility] ENDOSCOPY;  Service: Gastroenterology;  Laterality: N/A;   IR IMAGING GUIDED PORT INSERTION  11/13/2022   JOINT REPLACEMENT     REPLACEMENT TOTAL HIP W/  RESURFACING IMPLANTS Bilateral    SHOULDER SURGERY Bilateral    TOTAL KNEE ARTHROPLASTY Bilateral      Current Outpatient Medications:    carisoprodol (SOMA) 350 MG tablet, Take 350 mg by mouth 2 (two) times daily. , Disp: , Rfl:    dronabinol (MARINOL) 2.5 MG capsule, Take 1 capsule (2.5 mg total) by mouth 2 (two) times daily before a meal., Disp: 60 capsule, Rfl: 0   HYDROmorphone (DILAUDID) 2 MG tablet, Take 1-2 tablets (2-4 mg total) by mouth every 4 (four) hours as needed for severe pain., Disp: 60 tablet, Rfl: 0   lidocaine-prilocaine (EMLA) cream, Apply to affected area once, Disp: 30 g, Rfl: 3   loperamide (IMODIUM) 2 MG capsule, Take 1 capsule (2 mg total) by mouth See admin instructions. Initial: 4 mg,the 2 mg every 2 hours (4 mg every 4 hours at night)  maximum: 16 mg/day, Disp: 60 capsule, Rfl: 2   LORazepam (ATIVAN) 0.5 MG tablet, Take 1 tablet (0.5 mg total) by mouth every 8 (eight) hours as needed (nausea)., Disp: 90 tablet, Rfl: 0   magic mouthwash w/lidocaine SOLN, Take 5 mLs by mouth 4 (four) times daily as needed for mouth pain. Sig: Swish/Swallow 5-10 ml four times a day as needed. Dispense 480 ml. 1RF, Disp: 480 mL, Rfl: 1  melatonin (MELATONIN MAXIMUM STRENGTH) 5 MG TABS, Take 5 mg by mouth at bedtime as needed., Disp: , Rfl:    naloxone (NARCAN) nasal spray 4 mg/0.1 mL, Place 1 spray into the nose once for 1 dose. Spray half of bottle content into each nostril, then call 911, Disp: 2 each, Rfl: 0   nystatin (MYCOSTATIN) 100000 UNIT/ML suspension, Take 5 mLs (500,000 Units total) by mouth 4 (four) times daily., Disp: 473 mL, Rfl: 1   omeprazole  (PRILOSEC) 10 MG capsule, Take 20 mg by mouth daily., Disp: , Rfl:    ondansetron (ZOFRAN) 8 MG tablet, Take 1 tablet (8 mg total) by mouth every 8 (eight) hours as needed for nausea or vomiting. Start on the third day after chemotherapy., Disp: 90 tablet, Rfl: 1   prazosin (MINIPRESS) 1 MG capsule, Take 2 mg by mouth at bedtime., Disp: , Rfl:    prochlorperazine (COMPAZINE) 10 MG tablet, Take 1 tablet (10 mg total) by mouth every 6 (six) hours as needed for nausea or vomiting., Disp: 90 tablet, Rfl: 1   rivaroxaban (XARELTO) 20 MG TABS tablet, Take 20 mg by mouth daily., Disp: , Rfl:    sertraline (ZOLOFT) 50 MG tablet, Take 100 mg by mouth at bedtime., Disp: , Rfl:    B Complex-C (VITAMIN B + C COMPLEX) TABS, Take 1 capsule by mouth every morning. (Patient not taking: Reported on 02/21/2023), Disp: , Rfl:    dexamethasone (DECADRON) 4 MG tablet, Take 2 tablets (8 mg total) by mouth daily. Start the day after chemotherapy for 2 days. Take with food. (Patient not taking: Reported on 02/21/2023), Disp: 30 tablet, Rfl: 1   Magnesium 500 MG TABS, Take 500 mg by mouth as needed. (Patient not taking: Reported on 02/21/2023), Disp: , Rfl:    Family History  Problem Relation Age of Onset   Dementia Mother    Heart disease Father      Social History   Tobacco Use   Smoking status: Every Day    Current packs/day: 0.50    Types: Cigarettes   Smokeless tobacco: Never  Vaping Use   Vaping status: Never Used  Substance Use Topics   Alcohol use: No    Alcohol/week: 0.0 standard drinks of alcohol   Drug use: No    Allergies as of 02/21/2023 - Review Complete 02/21/2023  Allergen Reaction Noted   Diltiazem Swelling 04/14/2011   Penicillins Hives and Rash 09/13/2012   Tizanidine Other (See Comments) 09/13/2012    Review of Systems:    All systems reviewed and negative except where noted in HPI.   Physical Exam:  BP 114/71 (BP Location: Left Arm, Patient Position: Sitting, Cuff Size: Normal)    Pulse 67   Temp 97.9 F (36.6 C) (Oral)   Ht 5\' 8"  (1.727 m)   Wt 130 lb 8 oz (59.2 kg)   BMI 19.84 kg/m  No LMP for male patient.  General:   Alert, thin built, poorly nourished, cachectic, pleasant and cooperative in NAD Head:  Normocephalic and atraumatic, bitemporal wasting. Eyes:  Sclera clear, no icterus.   Conjunctiva pink. Ears:  Normal auditory acuity. Nose:  No deformity, discharge, or lesions. Mouth:  No deformity or lesions,oropharynx pink & moist. Neck:  Supple; no masses or thyromegaly. Lungs:  Respirations even and unlabored.  Clear throughout to auscultation.   No wheezes, crackles, or rhonchi. No acute distress. Heart:  Regular rate and rhythm; no murmurs, clicks, rubs, or gallops. Abdomen:  Normal bowel sounds. Soft,  non-tender and non-distended without masses, hepatosplenomegaly or hernias noted.  No guarding or rebound tenderness.   Rectal: Not performed Msk:  Symmetrical without gross deformities.  Generalized weakness Pulses:  Normal pulses noted. Extremities:  No clubbing or edema.  No cyanosis. Neurologic:  Alert and oriented x3;  grossly normal neurologically. Skin:  Intact without significant lesions or rashes. No jaundice. Psych:  Alert and cooperative. Normal mood and affect.  Imaging Studies: Reviewed  Assessment and Plan:   Cranston Atiles is a 77 y.o. male with metastatic esophageal adenocarcinoma with extensive involvement of the liver was on chemotherapy, currently on a drug holiday due to declining functional status, sent to me for evaluation of cryotherapy because of dysphagia.  Patient states that his symptoms of dysphagia have somewhat improved since switching to Marinol Today, I have discussed with both patient and his wife regarding risks and benefits of cryotherapy of esophageal cancer.  Informed them that Cryospray ablation (cryotherapy) is a low-pressure, noncontact method that uses supercooling to cause cryonecrosis and has been  used to palliate esophageal cancer   Studies have suggested that endoscopic cryotherapy using liquid nitrogen spray improved dysphagia in patients with inoperable esophageal cancer. In a systematic review of five studies including 230 patients with advanced esophageal cancer, cryotherapy improved or stabilized dysphagia in 81 percent of patients and significantly improved symptoms in 55 percent of patients. Additional therapy for dysphagia was required for 19 percent of patients (eg, stent placement). The mean number of cryotherapy sessions was 3.4, and the major adverse event rate was low at 3.3 percent. In an earlier series of 29 patients with esophageal cancer, a complete intraluminal response was seen in 31 of 49 patients (63 percent) who completed liquid nitrogen spray cryotherapy. Benign strictures developed in 10 patients (13 percent), all of whom had undergone previous tumor therapy. The median number of treatments per patient was three (range, 1 to 25)   Patient's wife would like to know if this is covered by insurance.  Advised her that we will find out with insurance if this is covered.  Once confirmed, will obtain clearance from his cardiologist to hold Xarelto and schedule upper endoscopy   Follow up as needed   Aaron [name], MD

## 2023-02-21 NOTE — Progress Notes (Signed)
Symptom Management Clinic [facility] at [facility] [phone] [phone]  Patient Care Team: Rosemarie Ax, MD as PCP - General Benita Gutter, RN as Oncology Nurse Navigator Rickard Patience, MD as Consulting Physician (Oncology)   NAME OF PATIENT: Aaron Short  [id number]  1946/03/26   DATE OF VISIT: 02/21/23  REASON FOR CONSULT: Aaron Short is a 77 y.o. 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   INTERVAL HISTORY: Patient last saw Dr. Cathie Hoops on 02/07/2023 at which time decision was made to hold oxaliplatin and reduced dose 5-FU due to decreased performance status.  Patient received cycle 6.  Patient was seen in [facility] on 02/15/2023 with complaint of neoplasm related pain, dehydration, poor oral intake and nausea.  He was started on Marinol 2.5 mg twice daily and was rotated from oxycodone to oral hydromorphone.  Patient received IV fluids.  He returns to clinic today for follow-up labs and supportive care.  Today, patient reports that he is overall feeling better.  He says his appetite is slightly improved.  His nausea has greatly improved.  He says his pain is about the same.  Patient remains weak but denies recurrent falls since last seen.  He does have a previous skin tear on his left arm after a recent fall.  Denies any neurologic complaints. Denies recent fevers or illnesses. Denies any easy bleeding or bruising. Denies chest pain. Denies any , vomiting, constipation, or diarrhea. Denies urinary complaints. Patient offers no further specific complaints today.   PAST MEDICAL HISTORY: Past Medical History:  Diagnosis Date   Arthralgia of lower leg 04/02/2012   Arthralgia of upper arm 06/18/2009   Cancer (HCC)    Diabetes mellitus without complication (HCC)    FH: atrial fibrillation    Hypertension     PAST SURGICAL HISTORY:  Past  Surgical History:  Procedure Laterality Date   bladder cancer surgery  2015   ESOPHAGOGASTRODUODENOSCOPY (EGD) WITH PROPOFOL N/A 10/30/2022   Procedure: ESOPHAGOGASTRODUODENOSCOPY (EGD) WITH PROPOFOL;  Surgeon: Toney Reil, MD;  Location: [facility] ENDOSCOPY;  Service: Gastroenterology;  Laterality: N/A;   IR IMAGING GUIDED PORT INSERTION  11/13/2022   JOINT REPLACEMENT     REPLACEMENT TOTAL HIP W/  RESURFACING IMPLANTS Bilateral    SHOULDER SURGERY Bilateral    TOTAL KNEE ARTHROPLASTY Bilateral     HEMATOLOGY/ONCOLOGY HISTORY:  Oncology History  Malignant neoplasm of urinary bladder (HCC) (Resolved)  10/23/2013 Initial Diagnosis   Malignant neoplasm of urinary bladder (HCC)   11/03/2022 Cancer Staging   Staging form: Urinary Bladder, AJCC 7th Edition - Clinical: Stage 0a (Ta, N0, M0) - Signed by Rickard Patience, MD on 11/03/2022   Esophageal adenocarcinoma (HCC)  10/20/2022 Imaging   CT abdomen pelvis w contrast  -Multiple bilobar hepatic lesions extending throughout most of the liver parenchyma and concerning for metastatic disease. Correlation with tissue sampling could be considered for definitive pathologic diagnosis.   - Circumferential wall thickening of the distal esophagus and gastroesophageal junction. Recommend further evaluation with endoscopy to exclude underlying malignancy. No other evidence of primary malignancy in the abdomen or pelvis although note is made that evaluation of the pelvic structures is very limited in this CT scan due to extensive artifact secondary to the patient's bilateral total hip arthroplasty.   - Cystic lesion in the pancreatic tail measuring up to 1.0 cm, possibly a sidebranch IPMN. No obvious pancreatic ductal dilatation. Further evaluation with MRI/MRCP could  be considered.   - Extensive degenerative changes to the spine with compression deformities of T11, T12 and L2, similar to prior.    10/20/2022 Imaging   CT chest w contrast   New 4 mm nodule in the  posterior segment of left lower lobe, 3 mm nodule in the left upper chest of pulmonary metastasis.   Stable mild centrilobular emphysema and mild peripheral reticulation which may reflect smoking-related lung fibrosis.   Mildly dilated esophagus with air-fluid level and circumferential thickening in the distal esophagus which may reflect chronic esophagitis. Consider endoscopic evaluation.    11/03/2022 Initial Diagnosis   Esophageal adenocarcinoma   + dysphagia for both liquid and solid food, cough after eating. Unintentional weight loss 50 pounds over the past years, 14 pounds within last year.   10/30/22 EGD showed A large, submucosal mass with no bleeding and stigmata of recent bleeding was found in the lower third of the esophagus, 35 to 40 cm from the incisors. The mass was partially obstructing and circumferential. Biopsies were taken with a cold forceps for histology.  Pathology showed moderately differentiated adenocarcinoma involving squamocolumnar junctional mucosa with focal intestinal metaplasia.   [facility] NGS showed ERBB2 copy number gain, TP53 stop gain, NSD1 frameshift, RARA copy number gain, TOP2A copy number gain. TMB 7.9 m/mb, MSI stable.    11/03/2022 Cancer Staging   Staging form: Esophagus - Adenocarcinoma, AJCC 8th Edition - Clinical stage from 11/03/2022: Stage IVB (cTX, cNX, cM1) - Signed by Rickard Patience, MD on 11/03/2022 Stage prefix: Initial diagnosis   11/13/2022 Imaging   PET scan showed 1. Tracer avid tumor is identified within the distal esophagus extending into the cardia. 2. Extensive tracer avid liver metastases. 3. Tiny nodule within the medial left lower lobe is too small to characterize by PET-CT measuring 4 mm. Attention to this nodule on future surveillance imaging is advised. 4. Asymmetric uptake localizing to the right first rib costosternal junction and left AC joint is noted. Findings are favored to represent arthropathic change 5. Gallstones. 6.  Aortic  Atherosclerosis   11/13/2022 Procedure   Mediport placement by IR   11/15/2022 - 11/17/2022 Chemotherapy   Patient is on Treatment Plan : GASTROESOPHAGEAL FOLFOX q14d x 6 cycles     11/29/2022 -  Chemotherapy   Patient is on Treatment Plan : GASTROESOPHAGEAL Trastuzumab (6/4) D1 + FOLFOX D1 q14d x 12 cycles / Trastuzumab q14d     01/10/2023 - 01/14/2023 Hospital Admission   hospitalization after a fall, elbow skin abrasion, pneumonia.  Was treated with IV antibiotics, discharged home with a course of oral antibiotics   02/03/2023 Imaging   1. Circumferential wall thickening of the lower third of the esophagus and gastric cardia, similar to prior examination, consistent with known primary esophageal malignancy. 2. Numerous bulky, rim enhancing hepatic metastases are seen. Although these are difficult to clearly compare to prior noncontrast, nondiagnostic PET-CT, they are somewhat diminished in size, consistent with treatment response. 3. No other evidence of metastatic disease in the chest, abdomen, or pelvis. 4. Unchanged small nodule of the medial dependent left lower lobe measuring 0.3 cm, almost certainly benign and incidental. Continued attention on follow-up. 5. Mild pulmonary fibrosis in a pattern with a slight apical to basal gradient, featuring irregular peripheral interstitial opacity, septal thickening, and subpleural bronchiolectasis without clear honeycombing. Findings are consistent with probable UIP pattern pulmonary fibrosis. 6. Cholelithiasis. 7. Emphysema. 8. Coronary artery disease.   Aortic Atherosclerosis (ICD10-I70.0) and Emphysema (ICD10-J43.9)     ALLERGIES:  is allergic to diltiazem, penicillins, and tizanidine.  MEDICATIONS:  Current Outpatient Medications  Medication Sig Dispense Refill   B Complex-C (VITAMIN B + C COMPLEX) TABS Take 1 capsule by mouth every morning.     carisoprodol (SOMA) 350 MG tablet Take 350 mg by mouth 2 (two) times daily.       dronabinol (MARINOL) 2.5 MG capsule Take 1 capsule (2.5 mg total) by mouth 2 (two) times daily before a meal. 60 capsule 0   HYDROmorphone (DILAUDID) 2 MG tablet Take 1-2 tablets (2-4 mg total) by mouth every 4 (four) hours as needed for severe pain. 60 tablet 0   lidocaine-prilocaine (EMLA) cream Apply to affected area once 30 g 3   loperamide (IMODIUM) 2 MG capsule Take 1 capsule (2 mg total) by mouth See admin instructions. Initial: 4 mg,the 2 mg every 2 hours (4 mg every 4 hours at night)  maximum: 16 mg/day 60 capsule 2   LORazepam (ATIVAN) 0.5 MG tablet Take 1 tablet (0.5 mg total) by mouth every 8 (eight) hours as needed (nausea). 90 tablet 0   magic mouthwash w/lidocaine SOLN Take 5 mLs by mouth 4 (four) times daily as needed for mouth pain. Sig: Swish/Swallow 5-10 ml four times a day as needed. Dispense 480 ml. 1RF 480 mL 1   Magnesium 500 MG TABS Take 500 mg by mouth as needed.     melatonin (MELATONIN MAXIMUM STRENGTH) 5 MG TABS Take 5 mg by mouth at bedtime as needed.     nystatin (MYCOSTATIN) 100000 UNIT/ML suspension Take 5 mLs (500,000 Units total) by mouth 4 (four) times daily. 473 mL 1   omeprazole (PRILOSEC) 10 MG capsule Take 20 mg by mouth daily.     ondansetron (ZOFRAN) 8 MG tablet Take 1 tablet (8 mg total) by mouth every 8 (eight) hours as needed for nausea or vomiting. Start on the third day after chemotherapy. 90 tablet 1   prazosin (MINIPRESS) 1 MG capsule Take 2 mg by mouth at bedtime.     prochlorperazine (COMPAZINE) 10 MG tablet Take 1 tablet (10 mg total) by mouth every 6 (six) hours as needed for nausea or vomiting. 90 tablet 1   rivaroxaban (XARELTO) 20 MG TABS tablet Take 20 mg by mouth daily.     dexamethasone (DECADRON) 4 MG tablet Take 2 tablets (8 mg total) by mouth daily. Start the day after chemotherapy for 2 days. Take with food. (Patient not taking: Reported on 02/21/2023) 30 tablet 1   naloxone (NARCAN) nasal spray 4 mg/0.1 mL Place 1 spray into the nose once  for 1 dose. Spray half of bottle content into each nostril, then call 911 (Patient not taking: Reported on 12/27/2022) 2 each 0   sertraline (ZOLOFT) 50 MG tablet Take 100 mg by mouth at bedtime.     No current facility-administered medications for this visit.   Facility-Administered Medications Ordered in Other Visits  Medication Dose Route Frequency Provider Last Rate Last Admin   heparin lock flush 100 unit/mL  500 Units Intravenous Once Kimmy Parish, Daryl Eastern, NP       sodium chloride flush (NS) 0.9 % injection 10 mL  10 mL Intravenous Once Shashana Fullington, Daryl Eastern, NP        VITAL SIGNS: BP (!) 117/52 (BP Location: Left Arm, Patient Position: Sitting, Cuff Size: Normal)   Pulse 67   Temp (!) 96.7 F (35.9 C) (Tympanic)   Resp 18   Ht 5\' 8"  (1.727 m)   Wt 128  lb (58.1 kg)   SpO2 99% Comment: room air  BMI 19.46 kg/m  Filed Weights   02/21/23 0841  Weight: 128 lb (58.1 kg)    Estimated body mass index is 19.46 kg/m as calculated from the following:   Height as of this encounter: 5\' 8"  (1.727 m).   Weight as of this encounter: 128 lb (58.1 kg).  LABS: CBC:    Component Value Date/Time   WBC 11.4 (H) 02/21/2023 0837   WBC 6.2 01/14/2023 0439   HGB 10.2 (L) 02/21/2023 0837   HCT 30.6 (L) 02/21/2023 0837   PLT 263 02/21/2023 0837   MCV 104.1 (H) 02/21/2023 0837   NEUTROABS 8.8 (H) 02/21/2023 0837   LYMPHSABS 1.3 02/21/2023 0837   MONOABS 1.1 (H) 02/21/2023 0837   EOSABS 0.1 02/21/2023 0837   BASOSABS 0.0 02/21/2023 0837   Comprehensive Metabolic Panel:    Component Value Date/Time   NA 136 02/15/2023 0910   NA 141 02/10/2019 1408   K 3.6 02/15/2023 0910   CL 99 02/15/2023 0910   CO2 25 02/15/2023 0910   BUN 26 (H) 02/15/2023 0910   BUN 11 02/10/2019 1408   CREATININE 0.71 02/15/2023 0910   GLUCOSE 169 (H) 02/15/2023 0910   CALCIUM 9.1 02/15/2023 0910   AST 31 02/15/2023 0910   ALT 21 02/15/2023 0910   ALKPHOS 123 02/15/2023 0910   BILITOT 0.7 02/15/2023 0910   PROT  6.3 (L) 02/15/2023 0910   PROT 6.4 02/10/2019 1408   ALBUMIN 3.1 (L) 02/15/2023 0910   ALBUMIN 4.0 02/10/2019 1408    RADIOGRAPHIC STUDIES: CT CHEST ABDOMEN PELVIS W CONTRAST  Result Date: 02/03/2023 CLINICAL DATA:  Esophageal cancer restaging * Tracking Code: BO * EXAM: CT CHEST, ABDOMEN, AND PELVIS WITH CONTRAST TECHNIQUE: Multidetector CT imaging of the chest, abdomen and pelvis was performed following the standard protocol during bolus administration of intravenous contrast. RADIATION DOSE REDUCTION: This exam was performed according to the departmental dose-optimization program which includes automated exposure control, adjustment of the mA and/or kV according to patient size and/or use of iterative reconstruction technique. CONTRAST:  OMNIPAQUE IOHEXOL 300 MG/ML  SOLN COMPARISON:  PET-CT, 12/13/2022 FINDINGS: CT CHEST FINDINGS Cardiovascular: Aortic atherosclerosis. Right chest port catheter. Normal heart size. Three-vessel coronary artery calcifications no pericardial effusion. Mediastinum/Nodes: Unchanged prominent subcentimeter mediastinal lymph nodes, likely reactive to fibrosis, not previously FDG avid. Circumferential wall thickening of the lower third of the esophagus, similar to prior examination (series 3, image 46). Trachea and thyroid demonstrate no significant findings. Lungs/Pleura: Moderate centrilobular emphysema. Diffuse bilateral bronchial wall thickening. Mild pulmonary fibrosis in a pattern with a slight apical to basal gradient, featuring irregular peripheral interstitial opacity, septal thickening, and subpleural bronchiolectasis without clear honeycombing. Unchanged small nodule of the medial dependent left lower lobe measuring 0.3 cm (series 5, image 93). No pleural effusion or pneumothorax. Musculoskeletal: No chest wall abnormality. No acute osseous findings. CT ABDOMEN PELVIS FINDINGS Hepatobiliary: Numerous bulky, rim enhancing hepatic metastases are seen. Although  these are difficult to clearly compare to prior noncontrast, nondiagnostic PET-CT, they are somewhat diminished in size, index lesion of the anterior right lobe of the liver measuring 5.0 x 4.1 cm, previously 6.9 x 4.6 cm (series 3, image 67), large adjacent lesions of the liver dome measuring 6.9 x 6.4 cm, previously 9.2 x 7.5 cm (series 3, image 48). Tiny gallstones. No gallbladder wall thickening, or biliary dilatation. Pancreas: Unremarkable. No pancreatic ductal dilatation or surrounding inflammatory changes. Spleen: Normal in size without  significant abnormality. Adrenals/Urinary Tract: Adrenal glands are unremarkable. Kidneys are normal, without renal calculi, solid lesion, or hydronephrosis. Evaluation of the bladder very limited by dense metallic streak artifact from adjacent hip arthroplasty. No obvious abnormality. Stomach/Bowel: Circumferential wall thickening of the gastroesophageal junction and cardia, similar to prior examination (series 3, image 63). Appendix appears normal. No evidence of bowel wall thickening, distention, or inflammatory changes. Vascular/Lymphatic: Aortic atherosclerosis. No enlarged abdominal or pelvic lymph nodes. Reproductive: Evaluation of the prostate very limited by dense metallic streak artifact. No obvious abnormality. Bilateral inguinal testicles. Other: No abdominal wall hernia or abnormality. No ascites. Musculoskeletal: No acute osseous findings. Status post bilateral hip total arthroplasty dense metallic streak artifact. Unchanged wedge and endplate deformities of T11 through L2 (series 7, image 69). IMPRESSION: 1. Circumferential wall thickening of the lower third of the esophagus and gastric cardia, similar to prior examination, consistent with known primary esophageal malignancy. 2. Numerous bulky, rim enhancing hepatic metastases are seen. Although these are difficult to clearly compare to prior noncontrast, nondiagnostic PET-CT, they are somewhat diminished in  size, consistent with treatment response. 3. No other evidence of metastatic disease in the chest, abdomen, or pelvis. 4. Unchanged small nodule of the medial dependent left lower lobe measuring 0.3 cm, almost certainly benign and incidental. Continued attention on follow-up. 5. Mild pulmonary fibrosis in a pattern with a slight apical to basal gradient, featuring irregular peripheral interstitial opacity, septal thickening, and subpleural bronchiolectasis without clear honeycombing. Findings are consistent with probable UIP pattern pulmonary fibrosis. 6. Cholelithiasis. 7. Emphysema. 8. Coronary artery disease. Aortic Atherosclerosis (ICD10-I70.0) and Emphysema (ICD10-J43.9). Electronically Signed   By: Jearld Lesch M.D.   On: 02/03/2023 16:58    PERFORMANCE STATUS (ECOG) : 2 - Symptomatic, <50% confined to bed  Review of Systems Unless otherwise noted, a complete review of systems is negative.  Physical Exam General: NAD, thin and frail appearing Cardiovascular: irregular Pulmonary: clear ant fields Abdomen: soft, nontender, + bowel sounds GU: no suprapubic tenderness Extremities: no edema, no joint deformities Skin: Skin tear left arm Neurological: Weakness but otherwise nonfocal    IMPRESSION/PLAN: Esophageal cancer-on dose reduced 5-FU.  Patient holding treatment this week due to recent frailty.  Patient will see Dr. Cathie Hoops next week.  Neoplasm related pain/chronic pain -continue oral hydromorphone  Oral intake/nausea -continue antiemetics.  Overall, symptoms improved on Marinol. IVFs and supportive care as needed.   Skin tear -wound cleaned and dressed in clinic today.  Will refer to wound center given high risk of delayed healing due to hypoalbuminemia.  Weakness -referral for rehab screening.  Patient pending home health PT/OT.  Patient will return next week to see Dr. Cathie Hoops.  Follow-up telephone visit with me in 2 to 3 weeks   Patient expressed understanding and was in agreement  with this plan. He also understands that He can call clinic at any time with any questions, concerns, or complaints.   Thank you for allowing me to participate in the care of this very pleasant patient.   Time Total: 25 minutes  Visit consisted of counseling and education dealing with the complex and emotionally intense issues of symptom management in the setting of serious illness.Greater than 50%  of this time was spent counseling and coordinating care related to the above assessment and plan.  Signed by: Laurette Schimke, PhD, NP-C

## 2023-02-21 NOTE — Progress Notes (Signed)
Nutrition Follow-up:   Patient with esophageal adenocarcinoma, stage IV with liver involvement.  Patient receiving folfox and trastruzumab.    Met with patient prior to fluids today.  Having less nausea with addition of marinol.  Reports that appetite has increased a little bit.  Drinking about 3 boost VHC shakes a day.  Had some [place] food recently, beef stew, yogurt.  Seeing Dr Allegra Lai today to discuss cryotherapy.    Medications: reviewed  Labs: reviewed  Anthropometrics:   Weight 128 lb today  125 lb 12.8 oz on 9/19 128 lb on 9/11 127 lb 8 oz on 8/28 131 lb 1.6 oz on 8/14 132 lb on 7/31 135 lb on 7/17    NUTRITION DIAGNOSIS: Inadequate oral intake improved    INTERVENTION:  Continue boost VHC, goal is 4 a day Continue marinol as helping with nausea and appetite Continue high calorie, high protein foods    MONITORING, EVALUATION, GOAL: weight trends, intake   NEXT VISIT: Wednesday, Oct 16 during infusion  Miranda Garber B. Freida Busman, RD, LDN Registered Dietitian [phone]

## 2023-02-22 ENCOUNTER — Encounter: Payer: Self-pay | Admitting: Oncology

## 2023-02-22 ENCOUNTER — Telehealth: Payer: Self-pay

## 2023-02-22 NOTE — Telephone Encounter (Signed)
Printed Blood thinner clearance and called [facility] cardiology. Confirmed faxed number which was [phone]

## 2023-02-22 NOTE — Telephone Encounter (Signed)
Notification or Prior Authorization is not required for the requested services You are not required to submit a notification/prior authorization based on the information provided. If you have general questions about the prior authorization requirements, visit [email or web address] > Clinician Resources > Advance and Admission Notification Requirements. The number above acknowledges your notification. Please write this reference number down for future reference. If you would like to request an organization determination, please call us at [phone]. Decision ID #: [id number] The number above acknowledges your inquiry and our response. Please write this number down and refer to it for future inquiries. Coverage and payment for an item or service is governed by the member's benefit plan document, and, if applicable, the provider's participation agreement with the Health Plan. Patient details  Patient name Aaron Short Member number [id number] Group number [id number] Product POS Relationship Employee Effective date 05/29/2022 Termination date 05/29/2023 Insurance type Medicare Verbal language preference English Written language preference English A future timeline may be available for this member. For future coverage please call the telephone number located on the back of the [facility] Medical ID card. Admitting/attending physician details  Name Lannette Donath Tax ID number [id number] Address [address] [place], [place] [address] Status In network Service details  Place of service [facility] What is place of service? Service details Surgical Facility details  Name [facility] Tax ID number [id number] Address [address] [place], [place] [address] [phone] Status In network Facility service dates details  Start date 02/27/2023 End date 05/28/2023 Service description Scheduled What is service  description? Diagnosis code details  Code pointer Primary Diagnosis code C15.9 Description Malignant neoplasm of esophagus, unspecified Procedure code details  Code pointer Primary procedure code 09323 Description Esophagoscopy, flexible, transoral; with ablation of tumor(s), polyp(s), or other lesion(s) (includes pre- and post-dilation and guide wire passage, when performed) Selected servicing provider The provider who is providing the service being requested.  Servicing provider name Toney Reil Tax ID number [id number] Address [address], [place], [place] [address]  T. [phone]

## 2023-02-22 NOTE — Telephone Encounter (Signed)
CPT code is 43229 and DX code is C15.9

## 2023-02-22 NOTE — Telephone Encounter (Signed)
-----   Message from Lannette Donath sent at 02/21/2023  5:31 PM EDT ----- Regarding: Insurance approval Aaron Short  Let me know if it is covered by insurance or have patient's wife find out with his insurance if it is covered.  Once we confirm, we will schedule upper endoscopy after obtaining cardiac clearance to hold his blood thinner  Thanks RV

## 2023-02-26 ENCOUNTER — Telehealth: Payer: Self-pay | Admitting: *Deleted

## 2023-02-26 NOTE — Telephone Encounter (Signed)
Patient wife called stating that patient would like to speak with Josh regarding his pain management

## 2023-02-27 MED FILL — Dexamethasone Sodium Phosphate Inj 100 MG/10ML: INTRAMUSCULAR | Qty: 1 | Status: AC

## 2023-02-28 ENCOUNTER — Encounter: Payer: Self-pay | Admitting: Oncology

## 2023-02-28 ENCOUNTER — Inpatient Hospital Stay (HOSPITAL_BASED_OUTPATIENT_CLINIC_OR_DEPARTMENT_OTHER): Payer: Medicare Other | Admitting: Oncology

## 2023-02-28 ENCOUNTER — Encounter
Admission: RE | Admit: 2023-02-28 | Discharge: 2023-02-28 | Disposition: A | Discharge status: Home / Self Care | Payer: Medicare Other | Source: Ambulatory Visit | Attending: Oncology | Admitting: Oncology

## 2023-02-28 ENCOUNTER — Ambulatory Visit
Admission: RE | Admit: 2023-02-28 | Discharge: 2023-02-28 | Disposition: A | Discharge status: Home / Self Care | Payer: Medicare Other | Source: Ambulatory Visit | Attending: Oncology | Admitting: Oncology

## 2023-02-28 ENCOUNTER — Inpatient Hospital Stay (HOSPITAL_BASED_OUTPATIENT_CLINIC_OR_DEPARTMENT_OTHER): Payer: Medicare Other | Admitting: Hospice and Palliative Medicine

## 2023-02-28 ENCOUNTER — Ambulatory Visit
Admission: RE | Admit: 2023-02-28 | Discharge: 2023-02-28 | Disposition: A | Discharge status: Home / Self Care | Payer: Medicare Other | Source: Ambulatory Visit | Attending: Oncology

## 2023-02-28 ENCOUNTER — Inpatient Hospital Stay: Payer: Medicare Other

## 2023-02-28 ENCOUNTER — Inpatient Hospital Stay: Payer: Medicare Other | Admitting: Occupational Therapy

## 2023-02-28 ENCOUNTER — Inpatient Hospital Stay: Payer: Medicare Other | Attending: Oncology

## 2023-02-28 VITALS — BP 100/62 | HR 93 | Temp 97.3°F | Resp 18 | Wt 126.4 lb

## 2023-02-28 DIAGNOSIS — Z79891 Long term (current) use of opiate analgesic: Secondary | ICD-10-CM | POA: Diagnosis not present

## 2023-02-28 DIAGNOSIS — E860 Dehydration: Secondary | ICD-10-CM

## 2023-02-28 DIAGNOSIS — G893 Neoplasm related pain (acute) (chronic): Secondary | ICD-10-CM | POA: Insufficient documentation

## 2023-02-28 DIAGNOSIS — C159 Malignant neoplasm of esophagus, unspecified: Secondary | ICD-10-CM | POA: Insufficient documentation

## 2023-02-28 DIAGNOSIS — R5383 Other fatigue: Secondary | ICD-10-CM | POA: Insufficient documentation

## 2023-02-28 DIAGNOSIS — R3915 Urgency of urination: Secondary | ICD-10-CM | POA: Diagnosis not present

## 2023-02-28 DIAGNOSIS — R110 Nausea: Secondary | ICD-10-CM

## 2023-02-28 DIAGNOSIS — Z515 Encounter for palliative care: Secondary | ICD-10-CM

## 2023-02-28 DIAGNOSIS — R112 Nausea with vomiting, unspecified: Secondary | ICD-10-CM

## 2023-02-28 DIAGNOSIS — M25551 Pain in right hip: Secondary | ICD-10-CM | POA: Diagnosis present

## 2023-02-28 DIAGNOSIS — I4892 Unspecified atrial flutter: Secondary | ICD-10-CM | POA: Insufficient documentation

## 2023-02-28 DIAGNOSIS — R2681 Unsteadiness on feet: Secondary | ICD-10-CM | POA: Insufficient documentation

## 2023-02-28 DIAGNOSIS — E46 Unspecified protein-calorie malnutrition: Secondary | ICD-10-CM

## 2023-02-28 DIAGNOSIS — Z8551 Personal history of malignant neoplasm of bladder: Secondary | ICD-10-CM | POA: Diagnosis not present

## 2023-02-28 DIAGNOSIS — Z79899 Other long term (current) drug therapy: Secondary | ICD-10-CM | POA: Insufficient documentation

## 2023-02-28 DIAGNOSIS — S32008A Other fracture of unspecified lumbar vertebra, initial encounter for closed fracture: Secondary | ICD-10-CM | POA: Diagnosis not present

## 2023-02-28 DIAGNOSIS — Z9181 History of falling: Secondary | ICD-10-CM

## 2023-02-28 DIAGNOSIS — R300 Dysuria: Secondary | ICD-10-CM | POA: Diagnosis not present

## 2023-02-28 DIAGNOSIS — R1319 Other dysphagia: Secondary | ICD-10-CM

## 2023-02-28 DIAGNOSIS — C679 Malignant neoplasm of bladder, unspecified: Secondary | ICD-10-CM | POA: Diagnosis not present

## 2023-02-28 DIAGNOSIS — R1310 Dysphagia, unspecified: Secondary | ICD-10-CM | POA: Insufficient documentation

## 2023-02-28 DIAGNOSIS — R634 Abnormal weight loss: Secondary | ICD-10-CM | POA: Diagnosis not present

## 2023-02-28 DIAGNOSIS — C787 Secondary malignant neoplasm of liver and intrahepatic bile duct: Secondary | ICD-10-CM | POA: Insufficient documentation

## 2023-02-28 DIAGNOSIS — C155 Malignant neoplasm of lower third of esophagus: Secondary | ICD-10-CM | POA: Diagnosis present

## 2023-02-28 DIAGNOSIS — Z7189 Other specified counseling: Secondary | ICD-10-CM

## 2023-02-28 DIAGNOSIS — Z7952 Long term (current) use of systemic steroids: Secondary | ICD-10-CM | POA: Diagnosis not present

## 2023-02-28 DIAGNOSIS — Z96643 Presence of artificial hip joint, bilateral: Secondary | ICD-10-CM | POA: Insufficient documentation

## 2023-02-28 DIAGNOSIS — T451X5A Adverse effect of antineoplastic and immunosuppressive drugs, initial encounter: Secondary | ICD-10-CM | POA: Diagnosis not present

## 2023-02-28 DIAGNOSIS — C7802 Secondary malignant neoplasm of left lung: Secondary | ICD-10-CM | POA: Diagnosis not present

## 2023-02-28 DIAGNOSIS — F17210 Nicotine dependence, cigarettes, uncomplicated: Secondary | ICD-10-CM | POA: Insufficient documentation

## 2023-02-28 DIAGNOSIS — Z7901 Long term (current) use of anticoagulants: Secondary | ICD-10-CM | POA: Diagnosis not present

## 2023-02-28 LAB — CMP (CANCER CENTER ONLY)
ALT: 24 U/L (ref 0–44)
AST: 57 U/L — ABNORMAL HIGH (ref 15–41)
Albumin: 2.9 g/dL — ABNORMAL LOW (ref 3.5–5.0)
Alkaline Phosphatase: 144 U/L — ABNORMAL HIGH (ref 38–126)
Anion gap: 16 — ABNORMAL HIGH (ref 5–15)
BUN: 24 mg/dL — ABNORMAL HIGH (ref 8–23)
CO2: 24 mmol/L (ref 22–32)
Calcium: 9.1 mg/dL (ref 8.9–10.3)
Chloride: 94 mmol/L — ABNORMAL LOW (ref 98–111)
Creatinine: 0.73 mg/dL (ref 0.61–1.24)
GFR, Estimated: >60 mL/min (ref >60)
Glucose, Bld: 146 mg/dL — ABNORMAL HIGH (ref 70–99)
Potassium: 3.9 mmol/L (ref 3.5–5.1)
Sodium: 134 mmol/L — ABNORMAL LOW (ref 135–145)
Total Bilirubin: 0.5 mg/dL (ref 0.3–1.2)
Total Protein: 6.4 g/dL — ABNORMAL LOW (ref 6.5–8.1)

## 2023-02-28 LAB — CBC WITH DIFFERENTIAL (CANCER CENTER ONLY)
Abs Immature Granulocytes: 0.07 10*3/uL (ref 0.00–0.07)
Basophils Absolute: 0.1 10*3/uL (ref 0.0–0.1)
Basophils Relative: 1 %
Eosinophils Absolute: 0.1 10*3/uL (ref 0.0–0.5)
Eosinophils Relative: 1 %
HCT: 28.2 % — ABNORMAL LOW (ref 39.0–52.0)
Hemoglobin: 9.5 g/dL — ABNORMAL LOW (ref 13.0–17.0)
Immature Granulocytes: 1 %
Lymphocytes Relative: 14 %
Lymphs Abs: 1.5 10*3/uL (ref 0.7–4.0)
MCH: 35.4 pg — ABNORMAL HIGH (ref 26.0–34.0)
MCHC: 33.7 g/dL (ref 30.0–36.0)
MCV: 105.2 fL — ABNORMAL HIGH (ref 80.0–100.0)
Monocytes Absolute: 1.2 10*3/uL — ABNORMAL HIGH (ref 0.1–1.0)
Monocytes Relative: 11 %
Neutro Abs: 7.8 10*3/uL — ABNORMAL HIGH (ref 1.7–7.7)
Neutrophils Relative %: 72 %
Platelet Count: 333 10*3/uL (ref 150–400)
RBC: 2.68 MIL/uL — ABNORMAL LOW (ref 4.22–5.81)
RDW: 16.4 % — ABNORMAL HIGH (ref 11.5–15.5)
WBC Count: 10.7 10*3/uL — ABNORMAL HIGH (ref 4.0–10.5)
nRBC: 0 % (ref 0.0–0.2)

## 2023-02-28 MED ORDER — HYDROMORPHONE HCL 2 MG PO TABS: 4.0000 mg | ORAL_TABLET | ORAL | Status: DC | PRN

## 2023-02-28 MED ORDER — SODIUM CHLORIDE 0.9 % IV SOLN
Freq: Once | INTRAVENOUS | Status: AC
  Filled 2023-02-28: qty 250

## 2023-02-28 MED ORDER — HEPARIN SOD (PORK) LOCK FLUSH 100 UNIT/ML IV SOLN
500.0000 [IU] | Freq: Once | INTRAVENOUS | Status: AC
  Administered 2023-02-28: 500 [IU] via INTRAVENOUS
  Filled 2023-02-28: qty 5

## 2023-02-28 MED ORDER — TECHNETIUM TC 99M-LABELED RED BLOOD CELLS IV KIT
20.0000 | PACK | Freq: Once | INTRAVENOUS | Status: AC | PRN
  Administered 2023-02-28: 21.24 via INTRAVENOUS

## 2023-02-28 MED ORDER — SODIUM CHLORIDE 0.9 % IV SOLN
10.0000 mg | Freq: Once | INTRAVENOUS | Status: AC
  Administered 2023-02-28: 10 mg via INTRAVENOUS
  Filled 2023-02-28: qty 10

## 2023-02-28 MED ORDER — ONDANSETRON HCL 4 MG/2ML IJ SOLN
8.0000 mg | Freq: Once | INTRAMUSCULAR | Status: AC
  Administered 2023-02-28: 8 mg via INTRAVENOUS
  Filled 2023-02-28: qty 4

## 2023-02-28 NOTE — Assessment & Plan Note (Signed)
Prognosis is poor. Currently chemotherapy is on hold due to poor PS.  Will re-evaluate him in 2 weeks.  If his condition  rapidly declines, consider hospice. Discussed with patient and wife.

## 2023-02-28 NOTE — Telephone Encounter (Signed)
-----   Message from Lannette Donath sent at 02/27/2023 11:46 PM EDT ----- Regarding: Follow-up Morrie Sheldon  Please check with patient's wife if they made a decision regarding EGD for cryotherapy of her husband's esophageal tumor.  She was going to check with his insurance if it is covered  Thanks RV

## 2023-02-28 NOTE — Telephone Encounter (Signed)
Patient wife states that she has not called the insurance because he did have a fall and she been worried about the fall and not worried about this. She does not think he broke anything but they are in the Oncology office now and they are going to order a Xray to be done. She states that that she will call the insurance company today and give me a call back. Gave her the CPT code and DX code. Also informed her her cardiology has been reaching out to them about the blood thinner request. She states she will call them also

## 2023-02-28 NOTE — Progress Notes (Signed)
Pt here for follow up. Reports that he had a fall over the weekend and has soreness right hip

## 2023-02-28 NOTE — Progress Notes (Signed)
Palliative Medicine [facility] at [facility] [phone] [phone]   Name: Aaron Short Date: 02/28/2023 MRN: [id number]  DOB: Mar 24, 1946  Patient Care Team: Rosemarie Ax, MD as PCP - General Benita Gutter, RN as Oncology Nurse Navigator Rickard Patience, MD as Consulting Physician (Oncology)    REASON FOR CONSULTATION: Aaron Short is a 77 y.o. male with multiple medical problems including chronic pain previously followed by pain clinic, recently diagnosed with stage IV esophageal adenocarcinoma.  Palliative care was consulted to address goals and manage ongoing symptoms.   SOCIAL HISTORY:     reports that he has been smoking cigarettes. He has never used smokeless tobacco. He reports that he does not drink alcohol and does not use drugs.  Patient is married and lives at home with his wife.  ADVANCE DIRECTIVES:    CODE STATUS:   PAST MEDICAL HISTORY: Past Medical History:  Diagnosis Date   Arthralgia of lower leg 04/02/2012   Arthralgia of upper arm 06/18/2009   Cancer (HCC)    Diabetes mellitus without complication (HCC)    FH: atrial fibrillation    Hypertension     PAST SURGICAL HISTORY:  Past Surgical History:  Procedure Laterality Date   bladder cancer surgery  2015   ESOPHAGOGASTRODUODENOSCOPY (EGD) WITH PROPOFOL N/A 10/30/2022   Procedure: ESOPHAGOGASTRODUODENOSCOPY (EGD) WITH PROPOFOL;  Surgeon: Toney Reil, MD;  Location: [facility] ENDOSCOPY;  Service: Gastroenterology;  Laterality: N/A;   IR IMAGING GUIDED PORT INSERTION  11/13/2022   JOINT REPLACEMENT     REPLACEMENT TOTAL HIP W/  RESURFACING IMPLANTS Bilateral    SHOULDER SURGERY Bilateral    TOTAL KNEE ARTHROPLASTY Bilateral     HEMATOLOGY/ONCOLOGY HISTORY:  Oncology History  Malignant neoplasm of urinary bladder (HCC) (Resolved)  10/23/2013 Initial Diagnosis   Malignant neoplasm of urinary bladder (HCC)   11/03/2022 Cancer  Staging   Staging form: Urinary Bladder, AJCC 7th Edition - Clinical: Stage 0a (Ta, N0, M0) - Signed by Rickard Patience, MD on 11/03/2022   Esophageal adenocarcinoma (HCC)  10/20/2022 Imaging   CT abdomen pelvis w contrast  -Multiple bilobar hepatic lesions extending throughout most of the liver parenchyma and concerning for metastatic disease. Correlation with tissue sampling could be considered for definitive pathologic diagnosis.   - Circumferential wall thickening of the distal esophagus and gastroesophageal junction. Recommend further evaluation with endoscopy to exclude underlying malignancy. No other evidence of primary malignancy in the abdomen or pelvis although note is made that evaluation of the pelvic structures is very limited in this CT scan due to extensive artifact secondary to the patient's bilateral total hip arthroplasty.   - Cystic lesion in the pancreatic tail measuring up to 1.0 cm, possibly a sidebranch IPMN. No obvious pancreatic ductal dilatation. Further evaluation with MRI/MRCP could be considered.   - Extensive degenerative changes to the spine with compression deformities of T11, T12 and L2, similar to prior.    10/20/2022 Imaging   CT chest w contrast   New 4 mm nodule in the posterior segment of left lower lobe, 3 mm nodule in the left upper chest of pulmonary metastasis.   Stable mild centrilobular emphysema and mild peripheral reticulation which may reflect smoking-related lung fibrosis.   Mildly dilated esophagus with air-fluid level and circumferential thickening in the distal esophagus which may reflect chronic esophagitis. Consider endoscopic evaluation.    11/03/2022 Initial Diagnosis   Esophageal adenocarcinoma   + dysphagia for both liquid and solid food,  cough after eating. Unintentional weight loss 50 pounds over the past years, 14 pounds within last year.   10/30/22 EGD showed A large, submucosal mass with no bleeding and stigmata of recent bleeding was found  in the lower third of the esophagus, 35 to 40 cm from the incisors. The mass was partially obstructing and circumferential. Biopsies were taken with a cold forceps for histology.  Pathology showed moderately differentiated adenocarcinoma involving squamocolumnar junctional mucosa with focal intestinal metaplasia.   [facility] NGS showed ERBB2 copy number gain, TP53 stop gain, NSD1 frameshift, RARA copy number gain, TOP2A copy number gain. TMB 7.9 m/mb, MSI stable.    11/03/2022 Cancer Staging   Staging form: Esophagus - Adenocarcinoma, AJCC 8th Edition - Clinical stage from 11/03/2022: Stage IVB (cTX, cNX, cM1) - Signed by Rickard Patience, MD on 11/03/2022 Stage prefix: Initial diagnosis   11/13/2022 Imaging   PET scan showed 1. Tracer avid tumor is identified within the distal esophagus extending into the cardia. 2. Extensive tracer avid liver metastases. 3. Tiny nodule within the medial left lower lobe is too small to characterize by PET-CT measuring 4 mm. Attention to this nodule on future surveillance imaging is advised. 4. Asymmetric uptake localizing to the right first rib costosternal junction and left AC joint is noted. Findings are favored to represent arthropathic change 5. Gallstones. 6.  Aortic Atherosclerosis   11/13/2022 Procedure   Mediport placement by IR   11/15/2022 - 11/17/2022 Chemotherapy   Patient is on Treatment Plan : GASTROESOPHAGEAL FOLFOX q14d x 6 cycles     11/29/2022 -  Chemotherapy   Patient is on Treatment Plan : GASTROESOPHAGEAL Trastuzumab (6/4) D1 + FOLFOX D1 q14d x 12 cycles / Trastuzumab q14d     01/10/2023 - 01/14/2023 Hospital Admission   hospitalization after a fall, elbow skin abrasion, pneumonia.  Was treated with IV antibiotics, discharged home with a course of oral antibiotics   02/03/2023 Imaging   1. Circumferential wall thickening of the lower third of the esophagus and gastric cardia, similar to prior examination, consistent with known primary esophageal  malignancy. 2. Numerous bulky, rim enhancing hepatic metastases are seen. Although these are difficult to clearly compare to prior noncontrast, nondiagnostic PET-CT, they are somewhat diminished in size, consistent with treatment response. 3. No other evidence of metastatic disease in the chest, abdomen, or pelvis. 4. Unchanged small nodule of the medial dependent left lower lobe measuring 0.3 cm, almost certainly benign and incidental. Continued attention on follow-up. 5. Mild pulmonary fibrosis in a pattern with a slight apical to basal gradient, featuring irregular peripheral interstitial opacity, septal thickening, and subpleural bronchiolectasis without clear honeycombing. Findings are consistent with probable UIP pattern pulmonary fibrosis. 6. Cholelithiasis. 7. Emphysema. 8. Coronary artery disease.   Aortic Atherosclerosis (ICD10-I70.0) and Emphysema (ICD10-J43.9)     ALLERGIES:  is allergic to diltiazem, penicillins, and tizanidine.  MEDICATIONS:  Current Outpatient Medications  Medication Sig Dispense Refill   B Complex-C (VITAMIN B + C COMPLEX) TABS Take 1 capsule by mouth every morning. (Patient not taking: Reported on 02/21/2023)     carisoprodol (SOMA) 350 MG tablet Take 350 mg by mouth 2 (two) times daily.      dexamethasone (DECADRON) 4 MG tablet Take 2 tablets (8 mg total) by mouth daily. Start the day after chemotherapy for 2 days. Take with food. (Patient not taking: Reported on 02/21/2023) 30 tablet 1   dronabinol (MARINOL) 2.5 MG capsule Take 1 capsule (2.5 mg total) by mouth 2 (two) times daily  before a meal. 60 capsule 0   HYDROmorphone (DILAUDID) 2 MG tablet Take 1-2 tablets (2-4 mg total) by mouth every 4 (four) hours as needed for severe pain. 60 tablet 0   lidocaine-prilocaine (EMLA) cream Apply to affected area once 30 g 3   loperamide (IMODIUM) 2 MG capsule Take 1 capsule (2 mg total) by mouth See admin instructions. Initial: 4 mg,the 2 mg every 2 hours (4  mg every 4 hours at night)  maximum: 16 mg/day 60 capsule 2   LORazepam (ATIVAN) 0.5 MG tablet Take 1 tablet (0.5 mg total) by mouth every 8 (eight) hours as needed (nausea). 90 tablet 0   magic mouthwash w/lidocaine SOLN Take 5 mLs by mouth 4 (four) times daily as needed for mouth pain. Sig: Swish/Swallow 5-10 ml four times a day as needed. Dispense 480 ml. 1RF 480 mL 1   Magnesium 500 MG TABS Take 500 mg by mouth as needed. (Patient not taking: Reported on 02/21/2023)     melatonin (MELATONIN MAXIMUM STRENGTH) 5 MG TABS Take 5 mg by mouth at bedtime as needed.     naloxone (NARCAN) nasal spray 4 mg/0.1 mL Place 1 spray into the nose once for 1 dose. Spray half of bottle content into each nostril, then call 911 2 each 0   nystatin (MYCOSTATIN) 100000 UNIT/ML suspension Take 5 mLs (500,000 Units total) by mouth 4 (four) times daily. 473 mL 1   omeprazole (PRILOSEC) 10 MG capsule Take 20 mg by mouth daily.     ondansetron (ZOFRAN) 8 MG tablet Take 1 tablet (8 mg total) by mouth every 8 (eight) hours as needed for nausea or vomiting. Start on the third day after chemotherapy. 90 tablet 1   prazosin (MINIPRESS) 1 MG capsule Take 2 mg by mouth at bedtime.     prochlorperazine (COMPAZINE) 10 MG tablet Take 1 tablet (10 mg total) by mouth every 6 (six) hours as needed for nausea or vomiting. 90 tablet 1   rivaroxaban (XARELTO) 20 MG TABS tablet Take 20 mg by mouth daily.     sertraline (ZOLOFT) 50 MG tablet Take 100 mg by mouth at bedtime.     No current facility-administered medications for this visit.    VITAL SIGNS: There were no vitals taken for this visit. There were no vitals filed for this visit.  Estimated body mass index is 19.22 kg/m as calculated from the following:   Height as of 02/21/23: 5\' 8"  (1.727 m).   Weight as of an earlier encounter on 02/28/23: 126 lb 6.4 oz (57.3 kg).  LABS: CBC:    Component Value Date/Time   WBC 10.7 (H) 02/28/2023 0836   WBC 6.2 01/14/2023 0439   HGB  9.5 (L) 02/28/2023 0836   HCT 28.2 (L) 02/28/2023 0836   PLT 333 02/28/2023 0836   MCV 105.2 (H) 02/28/2023 0836   NEUTROABS 7.8 (H) 02/28/2023 0836   LYMPHSABS 1.5 02/28/2023 0836   MONOABS 1.2 (H) 02/28/2023 0836   EOSABS 0.1 02/28/2023 0836   BASOSABS 0.1 02/28/2023 0836   Comprehensive Metabolic Panel:    Component Value Date/Time   NA 134 (L) 02/28/2023 0836   NA 141 02/10/2019 1408   K 3.9 02/28/2023 0836   CL 94 (L) 02/28/2023 0836   CO2 24 02/28/2023 0836   BUN 24 (H) 02/28/2023 0836   BUN 11 02/10/2019 1408   CREATININE 0.73 02/28/2023 0836   GLUCOSE 146 (H) 02/28/2023 0836   CALCIUM 9.1 02/28/2023 0836   AST  57 (H) 02/28/2023 0836   ALT 24 02/28/2023 0836   ALKPHOS 144 (H) 02/28/2023 0836   BILITOT 0.5 02/28/2023 0836   PROT 6.4 (L) 02/28/2023 0836   PROT 6.4 02/10/2019 1408   ALBUMIN 2.9 (L) 02/28/2023 0836   ALBUMIN 4.0 02/10/2019 1408    RADIOGRAPHIC STUDIES: CT CHEST ABDOMEN PELVIS W CONTRAST  Result Date: 02/03/2023 CLINICAL DATA:  Esophageal cancer restaging * Tracking Code: BO * EXAM: CT CHEST, ABDOMEN, AND PELVIS WITH CONTRAST TECHNIQUE: Multidetector CT imaging of the chest, abdomen and pelvis was performed following the standard protocol during bolus administration of intravenous contrast. RADIATION DOSE REDUCTION: This exam was performed according to the departmental dose-optimization program which includes automated exposure control, adjustment of the mA and/or kV according to patient size and/or use of iterative reconstruction technique. CONTRAST:  OMNIPAQUE IOHEXOL 300 MG/ML  SOLN COMPARISON:  PET-CT, 12/13/2022 FINDINGS: CT CHEST FINDINGS Cardiovascular: Aortic atherosclerosis. Right chest port catheter. Normal heart size. Three-vessel coronary artery calcifications no pericardial effusion. Mediastinum/Nodes: Unchanged prominent subcentimeter mediastinal lymph nodes, likely reactive to fibrosis, not previously FDG avid. Circumferential wall thickening  of the lower third of the esophagus, similar to prior examination (series 3, image 46). Trachea and thyroid demonstrate no significant findings. Lungs/Pleura: Moderate centrilobular emphysema. Diffuse bilateral bronchial wall thickening. Mild pulmonary fibrosis in a pattern with a slight apical to basal gradient, featuring irregular peripheral interstitial opacity, septal thickening, and subpleural bronchiolectasis without clear honeycombing. Unchanged small nodule of the medial dependent left lower lobe measuring 0.3 cm (series 5, image 93). No pleural effusion or pneumothorax. Musculoskeletal: No chest wall abnormality. No acute osseous findings. CT ABDOMEN PELVIS FINDINGS Hepatobiliary: Numerous bulky, rim enhancing hepatic metastases are seen. Although these are difficult to clearly compare to prior noncontrast, nondiagnostic PET-CT, they are somewhat diminished in size, index lesion of the anterior right lobe of the liver measuring 5.0 x 4.1 cm, previously 6.9 x 4.6 cm (series 3, image 67), large adjacent lesions of the liver dome measuring 6.9 x 6.4 cm, previously 9.2 x 7.5 cm (series 3, image 48). Tiny gallstones. No gallbladder wall thickening, or biliary dilatation. Pancreas: Unremarkable. No pancreatic ductal dilatation or surrounding inflammatory changes. Spleen: Normal in size without significant abnormality. Adrenals/Urinary Tract: Adrenal glands are unremarkable. Kidneys are normal, without renal calculi, solid lesion, or hydronephrosis. Evaluation of the bladder very limited by dense metallic streak artifact from adjacent hip arthroplasty. No obvious abnormality. Stomach/Bowel: Circumferential wall thickening of the gastroesophageal junction and cardia, similar to prior examination (series 3, image 63). Appendix appears normal. No evidence of bowel wall thickening, distention, or inflammatory changes. Vascular/Lymphatic: Aortic atherosclerosis. No enlarged abdominal or pelvic lymph nodes.  Reproductive: Evaluation of the prostate very limited by dense metallic streak artifact. No obvious abnormality. Bilateral inguinal testicles. Other: No abdominal wall hernia or abnormality. No ascites. Musculoskeletal: No acute osseous findings. Status post bilateral hip total arthroplasty dense metallic streak artifact. Unchanged wedge and endplate deformities of T11 through L2 (series 7, image 69). IMPRESSION: 1. Circumferential wall thickening of the lower third of the esophagus and gastric cardia, similar to prior examination, consistent with known primary esophageal malignancy. 2. Numerous bulky, rim enhancing hepatic metastases are seen. Although these are difficult to clearly compare to prior noncontrast, nondiagnostic PET-CT, they are somewhat diminished in size, consistent with treatment response. 3. No other evidence of metastatic disease in the chest, abdomen, or pelvis. 4. Unchanged small nodule of the medial dependent left lower lobe measuring 0.3 cm, almost certainly benign and  incidental. Continued attention on follow-up. 5. Mild pulmonary fibrosis in a pattern with a slight apical to basal gradient, featuring irregular peripheral interstitial opacity, septal thickening, and subpleural bronchiolectasis without clear honeycombing. Findings are consistent with probable UIP pattern pulmonary fibrosis. 6. Cholelithiasis. 7. Emphysema. 8. Coronary artery disease. Aortic Atherosclerosis (ICD10-I70.0) and Emphysema (ICD10-J43.9). Electronically Signed   By: Jearld Lesch M.D.   On: 02/03/2023 16:58    PERFORMANCE STATUS (ECOG) : 2 - Symptomatic, <50% confined to bed  Review of Systems Unless otherwise noted, a complete review of systems is negative.  Physical Exam General: NAD Pulmonary: Unlabored Extremities: no edema, no joint deformities Skin: no rashes Neurological: Weakness but otherwise nonfocal  IMPRESSION: Patient previously followed by pain clinic and has been on opioids for decades.   We are now managing his pain medications due to neoplasm related pain/cancer.  Follow-up visit.  Patient seen in infusion.  Unfortunately, patient formant status has declined.  He has had several falls.  Now having right hip pain after a fall last week.  Able to stand and bear weight but it is painful.  He is being sent today for x-rays to rule out fracture.  Patient is taking hydromorphone 4 mg every 4 hours worse without significant improvement.  Denies adverse effects from pain medications.  Will liberalize dosing to 6 mg every 4 hours.  With previously on long acting opioids but did not find them helpful.  Chemotherapy currently on hold due to overall decline.  Supportive care today with IV fluids, antiemetics, and steroids.  PLAN: -Continue plan for workup/treatment -Liberalize oral hydromorphone 6 mg every 4 hours -Daily bowel regimen -Follow-up visit 2-3 weeks  Case and plan discussed with Dr. Cathie Hoops  Patient expressed understanding and was in agreement with this plan. He also understands that He can call the clinic at any time with any questions, concerns, or complaints.     Time Total: 15 minutes  Visit consisted of counseling and education dealing with the complex and emotionally intense issues of symptom management and palliative care in the setting of serious and potentially life-threatening illness.Greater than 50%  of this time was spent counseling and coordinating care related to the above assessment and plan.  Signed by: Laurette Schimke, PhD, NP-C

## 2023-02-28 NOTE — Assessment & Plan Note (Signed)
He needs to establish care with home PT/OT. Missed initial assessment.  Check right hip cxr- s/p fall soft land on right hip, now with pain.

## 2023-02-28 NOTE — Assessment & Plan Note (Signed)
Follow-up with nutritionist Continue nutrition supplements.  Recommend him to hold off Statin.  Continue Megace 20mg  Bid.

## 2023-02-28 NOTE — Assessment & Plan Note (Signed)
Antiemetics PRN Recommend trials of Ativan 0.5mg  Q8hours PRN nausea.  IVF and IV Dexamethasone, IV antiemetics today

## 2023-02-28 NOTE — Assessment & Plan Note (Signed)
Continue follow up with palliative care service.

## 2023-02-28 NOTE — Progress Notes (Signed)
Hematology/Oncology Progress note [phone] [phone]        REFERRING PROVIDER: Rickard Patience, MD    CHIEF COMPLAINTS/PURPOSE OF CONSULTATION:  Esophageal cancer.   ASSESSMENT & PLAN:   Cancer Staging  Esophageal adenocarcinoma [facility]) Staging form: Esophagus - Adenocarcinoma, AJCC 8th Edition - Clinical stage from 11/03/2022: Stage IVB (cTX, cNX, cM1) - Signed by Rickard Patience, MD on 11/03/2022   Esophageal adenocarcinoma [facility]) Image findings and pathology results are reviewed with patient and wife.  PET scan results were reviewed and discussed with patient.  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  NGS positive for HER2 copy again. Labs are reviewed and discussed with patient. Hold off  5-FU pump and Transtuzumab.due to decreased PS Repeat MUGA in early Oct.   Neoplasm related pain Continue follow-up with palliative care service.    Protein calorie malnutrition (HCC) Follow-up with nutritionist Continue nutrition supplements.  Recommend him to hold off Statin.  Continue Megace 20mg  Bid.    Risk for falls He needs to establish care with home PT/OT. Missed initial assessment.  Check right hip cxr- s/p fall soft land on right hip, now with pain.   Dysphagia Follow up with GI Dr. Allegra Lai to evaluate him for cryotherapy.  If not effective in relieving his symptoms, consider palliative RT  Chemotherapy-induced nausea Antiemetics PRN Recommend trials of Ativan 0.5mg  Q8hours PRN nausea.  IVF and IV Dexamethasone, IV antiemetics today  Goals of care, counseling/discussion Prognosis is poor. Currently chemotherapy is on hold due to poor PS.  Will re-evaluate him in 2 weeks.  If his condition  rapidly declines, consider hospice. Discussed with patient and wife.     Orders Placed This Encounter  Procedures   DG HIP UNILAT W OR W/O PELVIS 2-3 VIEWS RIGHT    Standing Status:   Future    Standing Expiration Date:    02/28/2024    Order Specific Question:   Reason for Exam (SYMPTOM  OR DIAGNOSIS REQUIRED)    Answer:   right hip pain s/p fall    Order Specific Question:   Preferred imaging location?    Answer:   [facility]   CE[facility]    Standing Status:   Future    Standing Expiration Date:   03/13/2024   CBC with Differential (Cancer Center Only)    Standing Status:   Future    Standing Expiration Date:   03/13/2024   CMP (Cancer Center only)    Standing Status:   Future    Standing Expiration Date:   03/13/2024     Follow-up in 2 weeks for lab MD 5-FU and trastuzumab. All questions were answered. The patient knows to call the clinic with any problems, questions or concerns.  Rickard Patience, MD, PhD [facility] Hematology Oncology 02/28/2023    HISTORY OF PRESENTING ILLNESS:  Aaron Short 77 y.o. male presents to establish care for esophageal adenocarcinoma.  I have reviewed his chart and materials related to his cancer extensively and collaborated history with the patient. Summary of oncologic history is as follows: Oncology History  Malignant neoplasm of urinary bladder (HCC) (Resolved)  10/23/2013 Initial Diagnosis   Malignant neoplasm of urinary bladder (HCC)   11/03/2022 Cancer Staging   Staging form: Urinary Bladder, AJCC 7th Edition - Clinical: Stage 0a (Ta, N0, M0) - Signed by Rickard Patience, MD on 11/03/2022   Esophageal adenocarcinoma (HCC)  10/20/2022 Imaging   CT abdomen pelvis w contrast  -Multiple bilobar hepatic lesions extending  throughout most of the liver parenchyma and concerning for metastatic disease. Correlation with tissue sampling could be considered for definitive pathologic diagnosis.   - Circumferential wall thickening of the distal esophagus and gastroesophageal junction. Recommend further evaluation with endoscopy to exclude underlying malignancy. No other evidence of primary malignancy in the abdomen or pelvis although note is made that evaluation of the pelvic  structures is very limited in this CT scan due to extensive artifact secondary to the patient's bilateral total hip arthroplasty.   - Cystic lesion in the pancreatic tail measuring up to 1.0 cm, possibly a sidebranch IPMN. No obvious pancreatic ductal dilatation. Further evaluation with MRI/MRCP could be considered.   - Extensive degenerative changes to the spine with compression deformities of T11, T12 and L2, similar to prior.    10/20/2022 Imaging   CT chest w contrast   New 4 mm nodule in the posterior segment of left lower lobe, 3 mm nodule in the left upper chest of pulmonary metastasis.   Stable mild centrilobular emphysema and mild peripheral reticulation which may reflect smoking-related lung fibrosis.   Mildly dilated esophagus with air-fluid level and circumferential thickening in the distal esophagus which may reflect chronic esophagitis. Consider endoscopic evaluation.    11/03/2022 Initial Diagnosis   Esophageal adenocarcinoma   + dysphagia for both liquid and solid food, cough after eating. Unintentional weight loss 50 pounds over the past years, 14 pounds within last year.   10/30/22 EGD showed A large, submucosal mass with no bleeding and stigmata of recent bleeding was found in the lower third of the esophagus, 35 to 40 cm from the incisors. The mass was partially obstructing and circumferential. Biopsies were taken with a cold forceps for histology.  Pathology showed moderately differentiated adenocarcinoma involving squamocolumnar junctional mucosa with focal intestinal metaplasia.   [facility] NGS showed ERBB2 copy number gain, TP53 stop gain, NSD1 frameshift, RARA copy number gain, TOP2A copy number gain. TMB 7.9 m/mb, MSI stable.    11/03/2022 Cancer Staging   Staging form: Esophagus - Adenocarcinoma, AJCC 8th Edition - Clinical stage from 11/03/2022: Stage IVB (cTX, cNX, cM1) - Signed by Rickard Patience, MD on 11/03/2022 Stage prefix: Initial diagnosis   11/13/2022 Imaging   PET scan  showed 1. Tracer avid tumor is identified within the distal esophagus extending into the cardia. 2. Extensive tracer avid liver metastases. 3. Tiny nodule within the medial left lower lobe is too small to characterize by PET-CT measuring 4 mm. Attention to this nodule on future surveillance imaging is advised. 4. Asymmetric uptake localizing to the right first rib costosternal junction and left AC joint is noted. Findings are favored to represent arthropathic change 5. Gallstones. 6.  Aortic Atherosclerosis   11/13/2022 Procedure   Mediport placement by IR   11/15/2022 - 11/17/2022 Chemotherapy   Patient is on Treatment Plan : GASTROESOPHAGEAL FOLFOX q14d x 6 cycles     11/29/2022 -  Chemotherapy   Patient is on Treatment Plan : GASTROESOPHAGEAL Trastuzumab (6/4) D1 + FOLFOX D1 q14d x 12 cycles / Trastuzumab q14d     01/10/2023 - 01/14/2023 Hospital Admission   hospitalization after a fall, elbow skin abrasion, pneumonia.  Was treated with IV antibiotics, discharged home with a course of oral antibiotics   02/03/2023 Imaging   1. Circumferential wall thickening of the lower third of the esophagus and gastric cardia, similar to prior examination, consistent with known primary esophageal malignancy. 2. Numerous bulky, rim enhancing hepatic metastases are seen. Although these are difficult  to clearly compare to prior noncontrast, nondiagnostic PET-CT, they are somewhat diminished in size, consistent with treatment response. 3. No other evidence of metastatic disease in the chest, abdomen, or pelvis. 4. Unchanged small nodule of the medial dependent left lower lobe measuring 0.3 cm, almost certainly benign and incidental. Continued attention on follow-up. 5. Mild pulmonary fibrosis in a pattern with a slight apical to basal gradient, featuring irregular peripheral interstitial opacity, septal thickening, and subpleural bronchiolectasis without clear honeycombing. Findings are consistent with  probable UIP pattern pulmonary fibrosis. 6. Cholelithiasis. 7. Emphysema. 8. Coronary artery disease.   Aortic Atherosclerosis (ICD10-I70.0) and Emphysema (ICD10-J43.9)    He is a current every day smoker. He denies abdominal pain, nausea vomiting. Gait is not steady, frequent falls, this is a chronic issue.  Aifb on Xarelto. He currently is able to eat soft moist food.  + chronic neck and back pain, joint pain, he takes oxycodone 10-20mg  every 4-6 hours. Pain is controlled better.   During the interval he had a fall and was admitted. Closed compression fracture of lumbar vertebral body, traumatic rhabdomyolysis.  Discharged on 12/05/2022  + Dysuria for about 6 weeks. Chronically increased urinary urgency. UA urine culture negative.   INTERVAL HISTORY Aaron Short is a 77 y.o. male who has above history reviewed by me today presents for follow up visit for esophageal cancer, post hospitalization visit.   Lost 4 pounds since last visit. Marland Kitchen He takes megace 20mg  BID + nausea  Has not started PT/OT at home due to missed the initial assessment appointment.  He complains chronic joint pain and neck pain, which is not controlled with current pain regimen, he follows up with palliative care service.  +. Unsteady gait, frequent falls, recent soft landed on his right hip. + hip pain.  + dysphagia   MEDICAL HISTORY:  Past Medical History:  Diagnosis Date   Arthralgia of lower leg 04/02/2012   Arthralgia of upper arm 06/18/2009   Cancer (HCC)    Diabetes mellitus without complication (HCC)    FH: atrial fibrillation    Hypertension     SURGICAL HISTORY: Past Surgical History:  Procedure Laterality Date   bladder cancer surgery  2015   ESOPHAGOGASTRODUODENOSCOPY (EGD) WITH PROPOFOL N/A 10/30/2022   Procedure: ESOPHAGOGASTRODUODENOSCOPY (EGD) WITH PROPOFOL;  Surgeon: Toney Reil, MD;  Location: [facility] ENDOSCOPY;  Service: Gastroenterology;  Laterality: N/A;   IR IMAGING  GUIDED PORT INSERTION  11/13/2022   JOINT REPLACEMENT     REPLACEMENT TOTAL HIP W/  RESURFACING IMPLANTS Bilateral    SHOULDER SURGERY Bilateral    TOTAL KNEE ARTHROPLASTY Bilateral     SOCIAL HISTORY: Social History   Socioeconomic History   Marital status: Married    Spouse name: Not on file   Number of children: Not on file   Years of education: Not on file   Highest education level: Not on file  Occupational History   Not on file  Tobacco Use   Smoking status: Every Day    Current packs/day: 0.50    Types: Cigarettes   Smokeless tobacco: Never  Vaping Use   Vaping status: Never Used  Substance and Sexual Activity   Alcohol use: No    Alcohol/week: 0.0 standard drinks of alcohol   Drug use: No   Sexual activity: Not on file  Other Topics Concern   Not on file  Social History Narrative   Lives with wife, [name].    Social Determinants of Corporate investment banker  Strain: Low Risk  (11/03/2022)   Overall Financial Resource Strain (CARDIA)    Difficulty of Paying Living Expenses: Not very hard  Food Insecurity: No Food Insecurity (01/12/2023)   Hunger Vital Sign    Worried About Running Out of Food in the Last Year: Never true    Ran Out of Food in the Last Year: Never true  Transportation Needs: No Transportation Needs (01/12/2023)   PRAPARE - Administrator, Civil Service (Medical): No    Lack of Transportation (Non-Medical): No  Physical Activity: Sufficiently Active (02/16/2021)   [facility], [facility]   Exercise Vital Sign    Days of Exercise per Week: 7 days    Minutes of Exercise per Session: 150+ min  Stress: No Stress Concern Present (11/03/2022)   [employer] of Occupational Health - Occupational Stress Questionnaire    Feeling of Stress : Only a little  Social Connections: Moderately Integrated (02/16/2021)   Received from [facility], [facility]   Social Connection and Isolation Panel [NHANES]     Frequency of Communication with Friends and Family: Three times a week    Frequency of Social Gatherings with Friends and Family: Three times a week    Attends Religious Services: Never    Active Member of Clubs or Organizations: Yes    Attends Banker Meetings: More than 4 times per year    Marital Status: Married  Catering manager Violence: Not At Risk (01/12/2023)   Humiliation, Afraid, Rape, and Kick questionnaire    Fear of Current or Ex-Partner: No    Emotionally Abused: No    Physically Abused: No    Sexually Abused: No    FAMILY HISTORY: Family History  Problem Relation Age of Onset   Dementia Mother    Heart disease Father     ALLERGIES:  is allergic to diltiazem, penicillins, and tizanidine.  MEDICATIONS:  Current Outpatient Medications  Medication Sig Dispense Refill   B Complex-C (VITAMIN B + C COMPLEX) TABS Take 1 capsule by mouth every morning. (Patient not taking: Reported on 02/21/2023)     carisoprodol (SOMA) 350 MG tablet Take 350 mg by mouth 2 (two) times daily.      dexamethasone (DECADRON) 4 MG tablet Take 2 tablets (8 mg total) by mouth daily. Start the day after chemotherapy for 2 days. Take with food. (Patient not taking: Reported on 02/21/2023) 30 tablet 1   dronabinol (MARINOL) 2.5 MG capsule Take 1 capsule (2.5 mg total) by mouth 2 (two) times daily before a meal. 60 capsule 0   HYDROmorphone (DILAUDID) 2 MG tablet Take 1-2 tablets (2-4 mg total) by mouth every 4 (four) hours as needed for severe pain. 60 tablet 0   lidocaine-prilocaine (EMLA) cream Apply to affected area once 30 g 3   loperamide (IMODIUM) 2 MG capsule Take 1 capsule (2 mg total) by mouth See admin instructions. Initial: 4 mg,the 2 mg every 2 hours (4 mg every 4 hours at night)  maximum: 16 mg/day 60 capsule 2   LORazepam (ATIVAN) 0.5 MG tablet Take 1 tablet (0.5 mg total) by mouth every 8 (eight) hours as needed (nausea). 90 tablet 0   magic mouthwash w/lidocaine SOLN Take 5 mLs  by mouth 4 (four) times daily as needed for mouth pain. Sig: Swish/Swallow 5-10 ml four times a day as needed. Dispense 480 ml. 1RF 480 mL 1   Magnesium 500 MG TABS Take 500 mg by mouth  as needed. (Patient not taking: Reported on 02/21/2023)     melatonin (MELATONIN MAXIMUM STRENGTH) 5 MG TABS Take 5 mg by mouth at bedtime as needed.     naloxone (NARCAN) nasal spray 4 mg/0.1 mL Place 1 spray into the nose once for 1 dose. Spray half of bottle content into each nostril, then call 911 2 each 0   nystatin (MYCOSTATIN) 100000 UNIT/ML suspension Take 5 mLs (500,000 Units total) by mouth 4 (four) times daily. 473 mL 1   omeprazole (PRILOSEC) 10 MG capsule Take 20 mg by mouth daily.     ondansetron (ZOFRAN) 8 MG tablet Take 1 tablet (8 mg total) by mouth every 8 (eight) hours as needed for nausea or vomiting. Start on the third day after chemotherapy. 90 tablet 1   prazosin (MINIPRESS) 1 MG capsule Take 2 mg by mouth at bedtime.     prochlorperazine (COMPAZINE) 10 MG tablet Take 1 tablet (10 mg total) by mouth every 6 (six) hours as needed for nausea or vomiting. 90 tablet 1   rivaroxaban (XARELTO) 20 MG TABS tablet Take 20 mg by mouth daily.     sertraline (ZOLOFT) 50 MG tablet Take 100 mg by mouth at bedtime.     No current facility-administered medications for this visit.    Review of Systems  Constitutional:  Positive for appetite change, fatigue and unexpected weight change. Negative for chills and fever.  HENT:   Negative for hearing loss and voice change.   Eyes:  Negative for eye problems and icterus.  Respiratory:  Negative for chest tightness, cough and shortness of breath.   Cardiovascular:  Negative for chest pain and leg swelling.  Gastrointestinal:  Negative for abdominal distention and abdominal pain.       Heart burn  Endocrine: Negative for hot flashes.  Genitourinary:  Negative for difficulty urinating, dysuria and frequency.   Musculoskeletal:  Positive for arthralgias, back  pain, gait problem and neck pain.       Hip pain  Skin:  Negative for itching and rash.  Neurological:  Positive for gait problem. Negative for light-headedness and numbness.       Falls.   Hematological:  Negative for adenopathy. Does not bruise/bleed easily.  Psychiatric/Behavioral:  Negative for confusion.      PHYSICAL EXAMINATION: ECOG PERFORMANCE STATUS: 1 - Symptomatic but completely ambulatory  Vitals:   02/28/23 0851  BP: 100/62  Pulse: 93  Resp: 18  Temp: (!) 97.3 F (36.3 C)  SpO2: 96%   Filed Weights   02/28/23 0851  Weight: 126 lb 6.4 oz (57.3 kg)    Physical Exam Constitutional:      General: He is not in acute distress.    Appearance: He is ill-appearing. He is not diaphoretic.  HENT:     Head: Normocephalic and atraumatic.  Eyes:     General: No scleral icterus. Cardiovascular:     Rate and Rhythm: Normal rate and regular rhythm.  Pulmonary:     Effort: Pulmonary effort is normal. No respiratory distress.     Breath sounds: No wheezing.  Abdominal:     General: There is no distension.     Palpations: Abdomen is soft.     Tenderness: There is no abdominal tenderness.  Musculoskeletal:        General: Normal range of motion.     Cervical back: Normal range of motion and neck supple.  Skin:    General: Skin is warm and dry.  Findings: No erythema.  Neurological:     Mental Status: He is alert and oriented to person, place, and time. Mental status is at baseline.     Cranial Nerves: No cranial nerve deficit.     Motor: No abnormal muscle tone.  Psychiatric:        Mood and Affect: Mood and affect normal.      LABORATORY DATA:  I have reviewed the data as listed    Latest Ref Rng & Units 02/28/2023    8:36 AM 02/21/2023    8:37 AM 02/15/2023    9:10 AM  CBC  WBC 4.0 - 10.5 K/uL 10.7  11.4  9.2   Hemoglobin 13.0 - 17.0 g/dL 9.5  95.6  38.7   Hematocrit 39.0 - 52.0 % 28.2  30.6  31.2   Platelets 150 - 400 K/uL 333  263  315        Latest Ref Rng & Units 02/28/2023    8:36 AM 02/21/2023    8:37 AM 02/15/2023    9:10 AM  CMP  Glucose 70 - 99 mg/dL 564  332  951   BUN 8 - 23 mg/dL 24  17  26    Creatinine 0.61 - 1.24 mg/dL 8.84  1.66  0.63   Sodium 135 - 145 mmol/L 134  138  136   Potassium 3.5 - 5.1 mmol/L 3.9  3.6  3.6   Chloride 98 - 111 mmol/L 94  104  99   CO2 22 - 32 mmol/L 24  25  25    Calcium 8.9 - 10.3 mg/dL 9.1  9.0  9.1   Total Protein 6.5 - 8.1 g/dL 6.4  6.1  6.3   Total Bilirubin 0.3 - 1.2 mg/dL 0.5  0.6  0.7   Alkaline Phos 38 - 126 U/L 144  131  123   AST 15 - 41 U/L 57  37  31   ALT 0 - 44 U/L 24  17  21       RADIOGRAPHIC STUDIES: I have personally reviewed the radiological images as listed and agreed with the findings in the report. CT CHEST ABDOMEN PELVIS W CONTRAST  Result Date: 02/03/2023 CLINICAL DATA:  Esophageal cancer restaging * Tracking Code: BO * EXAM: CT CHEST, ABDOMEN, AND PELVIS WITH CONTRAST TECHNIQUE: Multidetector CT imaging of the chest, abdomen and pelvis was performed following the standard protocol during bolus administration of intravenous contrast. RADIATION DOSE REDUCTION: This exam was performed according to the departmental dose-optimization program which includes automated exposure control, adjustment of the mA and/or kV according to patient size and/or use of iterative reconstruction technique. CONTRAST:  OMNIPAQUE IOHEXOL 300 MG/ML  SOLN COMPARISON:  PET-CT, 12/13/2022 FINDINGS: CT CHEST FINDINGS Cardiovascular: Aortic atherosclerosis. Right chest port catheter. Normal heart size. Three-vessel coronary artery calcifications no pericardial effusion. Mediastinum/Nodes: Unchanged prominent subcentimeter mediastinal lymph nodes, likely reactive to fibrosis, not previously FDG avid. Circumferential wall thickening of the lower third of the esophagus, similar to prior examination (series 3, image 46). Trachea and thyroid demonstrate no significant findings. Lungs/Pleura: Moderate  centrilobular emphysema. Diffuse bilateral bronchial wall thickening. Mild pulmonary fibrosis in a pattern with a slight apical to basal gradient, featuring irregular peripheral interstitial opacity, septal thickening, and subpleural bronchiolectasis without clear honeycombing. Unchanged small nodule of the medial dependent left lower lobe measuring 0.3 cm (series 5, image 93). No pleural effusion or pneumothorax. Musculoskeletal: No chest wall abnormality. No acute osseous findings. CT ABDOMEN PELVIS FINDINGS Hepatobiliary: Numerous bulky, rim enhancing  hepatic metastases are seen. Although these are difficult to clearly compare to prior noncontrast, nondiagnostic PET-CT, they are somewhat diminished in size, index lesion of the anterior right lobe of the liver measuring 5.0 x 4.1 cm, previously 6.9 x 4.6 cm (series 3, image 67), large adjacent lesions of the liver dome measuring 6.9 x 6.4 cm, previously 9.2 x 7.5 cm (series 3, image 48). Tiny gallstones. No gallbladder wall thickening, or biliary dilatation. Pancreas: Unremarkable. No pancreatic ductal dilatation or surrounding inflammatory changes. Spleen: Normal in size without significant abnormality. Adrenals/Urinary Tract: Adrenal glands are unremarkable. Kidneys are normal, without renal calculi, solid lesion, or hydronephrosis. Evaluation of the bladder very limited by dense metallic streak artifact from adjacent hip arthroplasty. No obvious abnormality. Stomach/Bowel: Circumferential wall thickening of the gastroesophageal junction and cardia, similar to prior examination (series 3, image 63). Appendix appears normal. No evidence of bowel wall thickening, distention, or inflammatory changes. Vascular/Lymphatic: Aortic atherosclerosis. No enlarged abdominal or pelvic lymph nodes. Reproductive: Evaluation of the prostate very limited by dense metallic streak artifact. No obvious abnormality. Bilateral inguinal testicles. Other: No abdominal wall hernia or  abnormality. No ascites. Musculoskeletal: No acute osseous findings. Status post bilateral hip total arthroplasty dense metallic streak artifact. Unchanged wedge and endplate deformities of T11 through L2 (series 7, image 69). IMPRESSION: 1. Circumferential wall thickening of the lower third of the esophagus and gastric cardia, similar to prior examination, consistent with known primary esophageal malignancy. 2. Numerous bulky, rim enhancing hepatic metastases are seen. Although these are difficult to clearly compare to prior noncontrast, nondiagnostic PET-CT, they are somewhat diminished in size, consistent with treatment response. 3. No other evidence of metastatic disease in the chest, abdomen, or pelvis. 4. Unchanged small nodule of the medial dependent left lower lobe measuring 0.3 cm, almost certainly benign and incidental. Continued attention on follow-up. 5. Mild pulmonary fibrosis in a pattern with a slight apical to basal gradient, featuring irregular peripheral interstitial opacity, septal thickening, and subpleural bronchiolectasis without clear honeycombing. Findings are consistent with probable UIP pattern pulmonary fibrosis. 6. Cholelithiasis. 7. Emphysema. 8. Coronary artery disease. Aortic Atherosclerosis (ICD10-I70.0) and Emphysema (ICD10-J43.9). Electronically Signed   By: Jearld Lesch M.D.   On: 02/03/2023 16:58

## 2023-02-28 NOTE — Progress Notes (Signed)
Nutrition Follow-up:  Patient with esophageal adenocarcinoma, stage IV with liver involvement.  Patient receiving folfox and trastuzumab.  Chemo held today.    Met with patient and wife during IV fluids today.  Felt nauseated when first came to infusion today.  Says that marinol is helping control the nausea better and eat better.  Drinking 3-4 boost VHC shakes a day.  Eating some yogurt, soups.  Patient to weak for chemotherapy.  Thinking about cryotherapy with Dr Allegra Lai.     Medications: marinol  Labs: reviewed  Anthropometrics:   Weight 126 lb 6.4 oz today 125 lb 12.8 oz on 9/19 128 lb on 9/11 127 lb 8 oz on 8/28 131 lb 1.6 oz on 8/14 132 lb on 7/31 135 lb on 7/17   NUTRITION DIAGNOSIS: Inadequate oral intake ongoing    INTERVENTION:  Continue Boost VHC, ideally 4 or more a day Discussed high calorie, high protein soft foods to include in diet Continue marinol    MONITORING, EVALUATION, GOAL: weight trends, intake   NEXT VISIT: Wednesday, Oct 16  Aaron Short B. Freida Busman, RD, LDN Registered Dietitian [phone]

## 2023-02-28 NOTE — Therapy (Signed)
[facility] at [facility] [address] [place], [place], [address] Phone: [phone]   Fax:  [phone]  Occupational Therapy note:  Patient Details  Name: Aaron Short MRN: [id number] Date of Birth: [birth year] No data recorded  Encounter Date: 02/28/2023   OT End of Session - 02/28/23 1743     Visit Number 0             Past Medical History:  Diagnosis Date   Arthralgia of lower leg 04/02/2012   Arthralgia of upper arm 06/18/2009   Cancer (HCC)    Diabetes mellitus without complication (HCC)    FH: atrial fibrillation    Hypertension     Past Surgical History:  Procedure Laterality Date   bladder cancer surgery  2015   ESOPHAGOGASTRODUODENOSCOPY (EGD) WITH PROPOFOL N/A 10/30/2022   Procedure: ESOPHAGOGASTRODUODENOSCOPY (EGD) WITH PROPOFOL;  Surgeon: Toney Reil, MD;  Location: [facility] ENDOSCOPY;  Service: Gastroenterology;  Laterality: N/A;   IR IMAGING GUIDED PORT INSERTION  11/13/2022   JOINT REPLACEMENT     REPLACEMENT TOTAL HIP W/  RESURFACING IMPLANTS Bilateral    SHOULDER SURGERY Bilateral    TOTAL KNEE ARTHROPLASTY Bilateral     There were no vitals filed for this visit.        DR YU - 02/28/23 ASSESSMENT & PLAN:   Cancer Staging  Esophageal adenocarcinoma [facility]) Staging form: Esophagus - Adenocarcinoma, AJCC 8th Edition - Clinical stage from 11/03/2022: Stage IVB (cTX, cNX, cM1) - Signed by Rickard Patience, MD on 11/03/2022   Esophageal adenocarcinoma [facility]) Image findings and pathology results are reviewed with patient and wife.  PET scan results were reviewed and discussed with patient.  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  NGS positive for HER2 copy again. Labs are reviewed and discussed with patient. Hold off  5-FU pump and Transtuzumab.due to decreased PS Repeat MUGA in early Oct.     Neoplasm related pain Continue follow-up with  palliative care service.       Protein calorie malnutrition (HCC) Follow-up with nutritionist Continue nutrition supplements.  Recommend him to hold off Statin.  Continue Megace 20mg  Bid.      Risk for falls He needs to establish care with home PT/OT. Missed initial assessment.  Check right hip cxr- s/p fall soft land on right hip, now with pain.    Dysphagia Follow up with GI Dr. Allegra Lai to evaluate him for cryotherapy.  If not effective in relieving his symptoms, consider palliative RT   Chemotherapy-induced nausea Antiemetics PRN Recommend trials of Ativan 0.5mg  Q8hours PRN nausea.  IVF and IV Dexamethasone, IV antiemetics today   Goals of care, counseling/discussion Prognosis is poor. Currently chemotherapy is on hold due to poor PS.  Will re-evaluate him in 2 weeks.  If his condition  rapidly declines, consider hospice. Discussed with patient and wife.         OT NOTE 02/28/23: Patient referred to OT because of patient having several falls with hip pain. Patient had multiple appointments today OT was unable to see patient Chart review done Would recommend for patient to have home health PT and OT because of decline in status.                                  Visit Diagnosis: Risk for falls    Problem List Patient Active Problem List   Diagnosis  Date Noted   Dizziness 02/07/2023   Acute encephalopathy 01/13/2023   Gram-negative pneumonia (HCC) 01/13/2023   Bladder cancer (HCC) 01/13/2023   Secondary hypercoagulability disorder (HCC) 01/13/2023   Ambulatory dysfunction 01/13/2023   Hyponatremia 01/13/2023   Normocytic anemia 01/13/2023   Chemotherapy-induced nausea 01/10/2023   Mucositis 01/10/2023   Closed compression fracture of body of L1 vertebra (HCC) 01/02/2023   Compression fracture of lumbar vertebra (HCC) 12/13/2022   Closed fracture of lumbar vertebral body (HCC) 12/04/2022   Traumatic rhabdomyolysis (HCC) 12/04/2022    Dysuria 11/29/2022   Chemotherapy induced diarrhea 11/22/2022   Encounter for antineoplastic chemotherapy 11/15/2022   Esophageal adenocarcinoma (HCC) 11/03/2022   Goals of care, counseling/discussion 11/03/2022   Protein calorie malnutrition (HCC) 11/03/2022   Esophageal mass 10/30/2022   Intercostal neuralgia 10/12/2022   Multiple closed fractures of ribs of left side 10/12/2022   Abnormal drug screen 10/05/2022   Tick bite 10/05/2022   Rib pain on left side 10/05/2022   Dysphagia 09/21/2022   Melena 03/23/2022   Osteoporosis 05/02/2021   Weight loss 12/29/2020   Nightmares associated with chronic post-traumatic stress disorder 12/09/2019   Current moderate episode of major depressive disorder without prior episode (HCC) 11/14/2019   Risk for falls 07/10/2019   T12 compression fracture, sequela 07/09/2019   Chronic shoulder pain (Left) 07/09/2019   Chronic left-sided thoracic back pain 03/10/2019   Acute pain of left shoulder 03/10/2019   Acute midline thoracic back pain 03/10/2019   Pharmacologic therapy 12/31/2018   Disorder of skeletal system 12/31/2018   Problems influencing health status 12/31/2018   DDD (degenerative disc disease), cervical 12/31/2018   DDD (degenerative disc disease), lumbosacral 12/31/2018   Lumbosacral Grade 1 Retrolisthesis of L5/S1 12/31/2018   Neurogenic pain 12/31/2018   Chronic hip pain after THR (Bilateral) 12/31/2018   Strain of lumbar region 10/15/2017   Chronic wound of head 07/02/2017   Hematoma of leg, left, initial encounter 03/30/2017   Need for influenza vaccination 02/26/2017   Chronic acromioclavicular joint pain (Left) 09/05/2016   Osteoarthritis of shoulder (Bilateral) (L>R) 09/05/2016   Acromioclavicular arthrosis (Bilateral) (L>R) 09/05/2016   Arthralgia of acromioclavicular joint (Bilateral) (L>R) 09/05/2016   Obesity, Class I, BMI 30-34.9 05/11/2016   Chronic pain syndrome 05/11/2016   FH: atrial fibrillation    Smoker  01/07/2016   Disturbance of skin sensation 11/17/2015   Lumbar facet syndrome (Bilateral) (R>L) 08/16/2015   Lumbar spondylosis 08/16/2015   Chronic, continuous use of opioids 07/05/2015   Neoplasm related pain 05/18/2015   Long term prescription opiate use 05/18/2015   Opiate use (30 MME/Day) 05/18/2015   Encounter for therapeutic drug level monitoring 05/18/2015   Encounter for chronic pain management 05/18/2015   Opioid dependence, daily use (HCC) 05/18/2015   Chronic hip pain (Secondary area of Pain) (Bilateral) (R>L) 05/18/2015   Chronic shoulder pain (Primary Area of Pain) (Bilateral) (L>R) 05/18/2015   Chronic low back pain (Third area of Pain) (Bilateral) (R>L) 05/18/2015   Chronic knee pain (Bilateral) (R>L) 05/18/2015   S/P THR: total hip replacement (Bilateral) 05/18/2015   S/P TKR: total knee replacement (Bilateral) 05/18/2015   History of bladder cancer 05/18/2015   History of hip fracture 05/18/2015   History of pelvic fracture 05/18/2015   Long-term use of high-risk medication 05/18/2015   Chronic neck pain 05/18/2015   Chondrocalcinosis 05/18/2015   Chronic shoulder pain (Radicular) 05/18/2015   History of shoulder surgery 05/18/2015   Myofascial pain 05/18/2015   Chronic musculoskeletal pain 05/18/2015  Muscle spasm 05/18/2015   Pure hypercholesterolemia 12/31/2014   Essential hypertension 12/31/2014   Muscle spasticity 07/30/2014   Involuntary muscle contractions 07/30/2014   Malignant neoplasm of bladder (HCC) 10/23/2013   Malignant neoplasm of overlapping sites of bladder (HCC) 10/23/2013   Lower urinary tract infectious disease 09/16/2013   Atrial fibrillation and flutter (HCC) 01/14/2013   Mixed hyperlipidemia 06/18/2009   Hypertension, benign 06/18/2009   Impaired fasting glucose 06/18/2009    Oletta Cohn, OTR/L,CLT 02/28/2023, 5:44 PM  [facility] at [facility] [address] [place], [place],  [address] Phone: [phone]   Fax:  [phone]  Name: Rangel Cannada MRN: [id number] Date of Birth: 15-Jun-1945

## 2023-02-28 NOTE — Assessment & Plan Note (Addendum)
Image findings and pathology results are reviewed with patient and wife.  PET scan results were reviewed and discussed with patient.  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  NGS positive for HER2 copy again. Labs are reviewed and discussed with patient. Hold off  5-FU pump and Transtuzumab.due to decreased PS Repeat MUGA in early Oct.

## 2023-02-28 NOTE — Assessment & Plan Note (Signed)
Follow up with GI Dr. Allegra Lai to evaluate him for cryotherapy.  If not effective in relieving his symptoms, consider palliative RT

## 2023-03-01 ENCOUNTER — Encounter: Payer: Self-pay | Admitting: Hospice and Palliative Medicine

## 2023-03-01 ENCOUNTER — Telehealth: Payer: Self-pay | Admitting: *Deleted

## 2023-03-01 ENCOUNTER — Telehealth: Payer: Medicare Other | Admitting: Hospice and Palliative Medicine

## 2023-03-01 ENCOUNTER — Encounter: Payer: Self-pay | Admitting: Oncology

## 2023-03-01 LAB — CEA: CEA: 165 ng/mL — ABNORMAL HIGH (ref 0.0–4.7)

## 2023-03-01 NOTE — Telephone Encounter (Signed)
Wife called Asking about results for his hip xray yesterday as he is having a lot of pain and need to know what is going on. I see that it has not been read so I have called radiology and they will move him up on the list.

## 2023-03-01 NOTE — Telephone Encounter (Signed)
Wife called back stating that patient pain is worse. Report is now available  IMPRESSION: Status post bilateral hip replacements. Acute mildly displaced right proximal femoral periprosthetic fracture.     Electronically Signed   By: Jasmine Pang M.D.   On: 03/01/2023 16:03

## 2023-03-01 NOTE — Telephone Encounter (Signed)
I spoke with Josh who advises that patient go to the ER since he is worse and the xray shows a fractured. I called wife back and got her voice mail and left message that she needs to take him to the ER

## 2023-03-02 ENCOUNTER — Inpatient Hospital Stay: Payer: Medicare Other

## 2023-03-02 ENCOUNTER — Encounter: Payer: Self-pay | Admitting: Emergency Medicine

## 2023-03-02 ENCOUNTER — Other Ambulatory Visit: Payer: Self-pay

## 2023-03-02 ENCOUNTER — Observation Stay: Payer: Medicare Other

## 2023-03-02 ENCOUNTER — Emergency Department: Payer: Medicare Other

## 2023-03-02 ENCOUNTER — Observation Stay
Admission: EM | Admit: 2023-03-02 | Discharge: 2023-03-04 | Disposition: A | Discharge status: Home / Self Care | Payer: Medicare Other | Attending: Osteopathic Medicine | Admitting: Osteopathic Medicine

## 2023-03-02 DIAGNOSIS — R7989 Other specified abnormal findings of blood chemistry: Secondary | ICD-10-CM | POA: Diagnosis not present

## 2023-03-02 DIAGNOSIS — S72001A Fracture of unspecified part of neck of right femur, initial encounter for closed fracture: Secondary | ICD-10-CM | POA: Diagnosis not present

## 2023-03-02 DIAGNOSIS — I4891 Unspecified atrial fibrillation: Secondary | ICD-10-CM | POA: Insufficient documentation

## 2023-03-02 DIAGNOSIS — D530 Protein deficiency anemia: Secondary | ICD-10-CM | POA: Diagnosis not present

## 2023-03-02 DIAGNOSIS — S72002A Fracture of unspecified part of neck of left femur, initial encounter for closed fracture: Secondary | ICD-10-CM | POA: Diagnosis present

## 2023-03-02 DIAGNOSIS — I959 Hypotension, unspecified: Secondary | ICD-10-CM | POA: Diagnosis not present

## 2023-03-02 DIAGNOSIS — E440 Moderate protein-calorie malnutrition: Secondary | ICD-10-CM | POA: Diagnosis not present

## 2023-03-02 DIAGNOSIS — Z96653 Presence of artificial knee joint, bilateral: Secondary | ICD-10-CM | POA: Diagnosis not present

## 2023-03-02 DIAGNOSIS — S72111A Displaced fracture of greater trochanter of right femur, initial encounter for closed fracture: Principal | ICD-10-CM

## 2023-03-02 DIAGNOSIS — S79911A Unspecified injury of right hip, initial encounter: Secondary | ICD-10-CM | POA: Diagnosis present

## 2023-03-02 DIAGNOSIS — W19XXXA Unspecified fall, initial encounter: Secondary | ICD-10-CM | POA: Diagnosis not present

## 2023-03-02 LAB — CBC WITH DIFFERENTIAL/PLATELET
Abs Immature Granulocytes: 0.08 10*3/uL — ABNORMAL HIGH (ref 0.00–0.07)
Basophils Absolute: 0 10*3/uL (ref 0.0–0.1)
Basophils Relative: 0 %
Eosinophils Absolute: 0 10*3/uL (ref 0.0–0.5)
Eosinophils Relative: 0 %
HCT: 25.1 % — ABNORMAL LOW (ref 39.0–52.0)
Hemoglobin: 8.3 g/dL — ABNORMAL LOW (ref 13.0–17.0)
Immature Granulocytes: 1 %
Lymphocytes Relative: 7 %
Lymphs Abs: 0.7 10*3/uL (ref 0.7–4.0)
MCH: 35 pg — ABNORMAL HIGH (ref 26.0–34.0)
MCHC: 33.1 g/dL (ref 30.0–36.0)
MCV: 105.9 fL — ABNORMAL HIGH (ref 80.0–100.0)
Monocytes Absolute: 1 10*3/uL (ref 0.1–1.0)
Monocytes Relative: 10 %
Neutro Abs: 8.9 10*3/uL — ABNORMAL HIGH (ref 1.7–7.7)
Neutrophils Relative %: 82 %
Platelets: 299 10*3/uL (ref 150–400)
RBC: 2.37 MIL/uL — ABNORMAL LOW (ref 4.22–5.81)
RDW: 16.9 % — ABNORMAL HIGH (ref 11.5–15.5)
WBC: 10.8 10*3/uL — ABNORMAL HIGH (ref 4.0–10.5)
nRBC: 0 % (ref 0.0–0.2)

## 2023-03-02 LAB — HEPATIC FUNCTION PANEL
ALT: 23 U/L (ref 0–44)
AST: 85 U/L — ABNORMAL HIGH (ref 15–41)
Albumin: 2.5 g/dL — ABNORMAL LOW (ref 3.5–5.0)
Alkaline Phosphatase: 129 U/L — ABNORMAL HIGH (ref 38–126)
Bilirubin, Direct: 0.2 mg/dL (ref 0.0–0.2)
Indirect Bilirubin: 0.3 mg/dL (ref 0.3–0.9)
Total Bilirubin: 0.5 mg/dL (ref 0.3–1.2)
Total Protein: 5.9 g/dL — ABNORMAL LOW (ref 6.5–8.1)

## 2023-03-02 LAB — PROTIME-INR
INR: 2.1 — ABNORMAL HIGH (ref 0.8–1.2)
Prothrombin Time: 23.9 s — ABNORMAL HIGH (ref 11.4–15.2)

## 2023-03-02 LAB — BASIC METABOLIC PANEL
Anion gap: 11 (ref 5–15)
BUN: 26 mg/dL — ABNORMAL HIGH (ref 8–23)
CO2: 25 mmol/L (ref 22–32)
Calcium: 8.5 mg/dL — ABNORMAL LOW (ref 8.9–10.3)
Chloride: 98 mmol/L (ref 98–111)
Creatinine, Ser: 0.62 mg/dL (ref 0.61–1.24)
GFR, Estimated: >60 mL/min (ref >60)
Glucose, Bld: 159 mg/dL — ABNORMAL HIGH (ref 70–99)
Potassium: 3.6 mmol/L (ref 3.5–5.1)
Sodium: 134 mmol/L — ABNORMAL LOW (ref 135–145)

## 2023-03-02 LAB — SAMPLE TO BLOOD BANK

## 2023-03-02 LAB — APTT: aPTT: 42 s — ABNORMAL HIGH (ref 24–36)

## 2023-03-02 MED ORDER — RIVAROXABAN 20 MG PO TABS
20.0000 mg | ORAL_TABLET | Freq: Every day | ORAL | Status: DC
  Administered 2023-03-03 – 2023-03-04 (×2): 20 mg via ORAL
  Filled 2023-03-02 (×2): qty 1

## 2023-03-02 MED ORDER — SODIUM CHLORIDE 0.9% FLUSH
3.0000 mL | INTRAVENOUS | Status: DC | PRN
  Administered 2023-03-02: 3 mL via INTRAVENOUS

## 2023-03-02 MED ORDER — ACETAMINOPHEN 500 MG PO TABS
1000.0000 mg | ORAL_TABLET | Freq: Four times a day (QID) | ORAL | Status: DC | PRN
  Administered 2023-03-02: 1000 mg via ORAL
  Filled 2023-03-02: qty 2

## 2023-03-02 MED ORDER — HYDROMORPHONE HCL 2 MG PO TABS
4.0000 mg | ORAL_TABLET | ORAL | Status: DC | PRN
  Administered 2023-03-03 – 2023-03-04 (×2): 4 mg via ORAL
  Filled 2023-03-02 (×2): qty 2

## 2023-03-02 MED ORDER — MIDODRINE HCL 5 MG PO TABS
5.0000 mg | ORAL_TABLET | Freq: Three times a day (TID) | ORAL | Status: DC
  Administered 2023-03-03 – 2023-03-04 (×4): 5 mg via ORAL
  Filled 2023-03-02 (×5): qty 1

## 2023-03-02 MED ORDER — ONDANSETRON HCL 4 MG/2ML IJ SOLN: 4.0000 mg | Freq: Four times a day (QID) | INTRAMUSCULAR | Status: DC | PRN

## 2023-03-02 MED ORDER — SODIUM CHLORIDE 0.9 % IV BOLUS
1000.0000 mL | Freq: Once | INTRAVENOUS | Status: AC
  Administered 2023-03-02: 1000 mL via INTRAVENOUS

## 2023-03-02 MED ORDER — HYDROMORPHONE HCL 1 MG/ML IJ SOLN
0.5000 mg | INTRAMUSCULAR | Status: DC | PRN
  Administered 2023-03-03: 1 mg via INTRAVENOUS
  Filled 2023-03-02: qty 1

## 2023-03-02 MED ORDER — SODIUM CHLORIDE 0.9% FLUSH
3.0000 mL | Freq: Two times a day (BID) | INTRAVENOUS | Status: DC
  Administered 2023-03-03 – 2023-03-04 (×3): 3 mL via INTRAVENOUS

## 2023-03-02 MED ORDER — SODIUM CHLORIDE 0.9 % IV SOLN: 250.0000 mL | INTRAVENOUS | Status: DC | PRN

## 2023-03-02 MED ORDER — MIDODRINE HCL 5 MG PO TABS
5.0000 mg | ORAL_TABLET | Freq: Once | ORAL | Status: AC
  Administered 2023-03-02: 5 mg via ORAL
  Filled 2023-03-02: qty 1

## 2023-03-02 MED ORDER — SERTRALINE HCL 50 MG PO TABS
100.0000 mg | ORAL_TABLET | Freq: Every day | ORAL | Status: DC
  Administered 2023-03-02 – 2023-03-03 (×2): 100 mg via ORAL
  Filled 2023-03-02 (×2): qty 2

## 2023-03-02 MED ORDER — MAGIC MOUTHWASH W/LIDOCAINE: 5.0000 mL | Freq: Four times a day (QID) | ORAL | Status: DC | PRN

## 2023-03-02 MED ORDER — SENNOSIDES-DOCUSATE SODIUM 8.6-50 MG PO TABS: 1.0000 | ORAL_TABLET | Freq: Every evening | ORAL | Status: DC | PRN

## 2023-03-02 MED ORDER — DRONABINOL 2.5 MG PO CAPS
2.5000 mg | ORAL_CAPSULE | Freq: Two times a day (BID) | ORAL | Status: DC
  Administered 2023-03-03 – 2023-03-04 (×3): 2.5 mg via ORAL
  Filled 2023-03-02 (×5): qty 1

## 2023-03-02 MED ORDER — LORAZEPAM 0.5 MG PO TABS: 0.5000 mg | ORAL_TABLET | Freq: Three times a day (TID) | ORAL | Status: DC | PRN

## 2023-03-02 MED ORDER — ONDANSETRON HCL 4 MG PO TABS: 4.0000 mg | ORAL_TABLET | Freq: Four times a day (QID) | ORAL | Status: DC | PRN

## 2023-03-02 MED ORDER — LOPERAMIDE HCL 2 MG PO CAPS: 2.0000 mg | ORAL_CAPSULE | ORAL | Status: DC | PRN

## 2023-03-02 MED ORDER — MELATONIN 5 MG PO TABS
5.0000 mg | ORAL_TABLET | Freq: Every day | ORAL | Status: DC
  Administered 2023-03-02 – 2023-03-03 (×2): 5 mg via ORAL
  Filled 2023-03-02 (×2): qty 1

## 2023-03-02 MED ORDER — PANTOPRAZOLE SODIUM 40 MG PO TBEC
40.0000 mg | DELAYED_RELEASE_TABLET | Freq: Every day | ORAL | Status: DC
  Administered 2023-03-02 – 2023-03-04 (×3): 40 mg via ORAL
  Filled 2023-03-02 (×3): qty 1

## 2023-03-02 NOTE — ED Notes (Signed)
MD made aware of soft BP's. Per MD we will monitor for now.

## 2023-03-02 NOTE — ED Triage Notes (Signed)
Patient to ED via [facility] from home for right hip fracture. Pt states he fell on Sunday. Seen at [facility] yesterday and had x-ray done- confirmed fx and told to come to ED. Just decided to stop chemo 2 weeks ago for stage 4 esophageal cancer. Takes dilaudid at home for pain- took 6mg  this AM.

## 2023-03-02 NOTE — ED Provider Notes (Signed)
[facility] Provider Note    Event Date/Time   First MD Initiated Contact with Patient 03/02/23 1358     (approximate)   History   Hip Pain   HPI Aaron Short is a 77 y.o. male with esophageal adenocarcinoma, chronic pain secondary to neoplasm presenting today for right hip injury.  Patient reportedly had a fall 5 days ago at home with right hip injury.  He had worsening pain over the week while walking and got an x-ray 2 days ago with his primary care provider.  They called him today stating the x-ray showed evidence of a periprosthetic right hip fracture.  Came to the ED for further evaluation.  Denies any numbness or tingling in the right leg.  Denies any head injury or neck injury on the fall.  No pain elsewhere.  Prior to fall, did not require a walker or cane for ambulation.  Currently on Xarelto.     Physical Exam   Triage Vital Signs: ED Triage Vitals  Encounter Vitals Group     BP 03/02/23 1405 (!) 90/48     Systolic BP Percentile --      Diastolic BP Percentile --      Pulse Rate 03/02/23 1405 91     Resp 03/02/23 1405 19     Temp 03/02/23 1405 97.8 F (36.6 C)     Temp Source 03/02/23 1405 Oral     SpO2 03/02/23 1405 94 %     Weight 03/02/23 1402 123 lb (55.8 kg)     Height 03/02/23 1402 5\' 8"  (1.727 m)     Head Circumference --      Peak Flow --      Pain Score 03/02/23 1402 4     Pain Loc --      Pain Education --      Exclude from Growth Chart --     Most recent vital signs: Vitals:   03/02/23 1405  BP: (!) 90/48  Pulse: 91  Resp: 19  Temp: 97.8 F (36.6 C)  SpO2: 94%   Physical Exam: I have reviewed the vital signs and nursing notes. General: Awake, alert, no acute distress.  Nontoxic appearing. Head:  Atraumatic, normocephalic.   ENT:  EOM intact, PERRL. Oral mucosa is pink and moist with no lesions. Neck: Neck is supple with full range of motion, No meningeal signs. Cardiovascular:  RRR, No murmurs.  Peripheral pulses palpable and equal bilaterally. Respiratory:  Symmetrical chest wall expansion.  No rhonchi, rales, or wheezes.  Good air movement throughout.  No use of accessory muscles.   Musculoskeletal:  No cyanosis or edema. Moving extremities with full ROM.  Mild tenderness palpation to the lateral portion of the right hip.  Positive logroll test on the right side.  Neurovascularly intact throughout right lower extremity. Abdomen:  Soft, nontender, nondistended. Neuro:  GCS 15, moving all four extremities, interacting appropriately. Speech clear. Psych:  Calm, appropriate.   Skin:  Warm, dry, no rash.    ED Results / Procedures / Treatments   Labs (all labs ordered are listed, but only abnormal results are displayed) Labs Reviewed  CBC WITH DIFFERENTIAL/PLATELET - Abnormal; Notable for the following components:      Result Value   WBC 10.8 (*)    RBC 2.37 (*)    Hemoglobin 8.3 (*)    HCT 25.1 (*)    MCV 105.9 (*)    MCH 35.0 (*)    RDW 16.9 (*)  Neutro Abs 8.9 (*)    Abs Immature Granulocytes 0.08 (*)    All other components within normal limits  BASIC METABOLIC PANEL - Abnormal; Notable for the following components:   Sodium 134 (*)    Glucose, Bld 159 (*)    BUN 26 (*)    Calcium 8.5 (*)    All other components within normal limits  PROTIME-INR - Abnormal; Notable for the following components:   Prothrombin Time 23.9 (*)    INR 2.1 (*)    All other components within normal limits  APTT - Abnormal; Notable for the following components:   aPTT 42 (*)    All other components within normal limits  SAMPLE TO BLOOD BANK     EKG My EKG interpretation: Rate of 101, atrial fibrillation, normal axis.  No acute ST elevations or depressions.   RADIOLOGY Independently interpreted hip x-ray from 2 days ago showing evidence of acute mildly displaced right proximal femoral periprosthetic fracture   PROCEDURES:  Critical Care performed:  No  Procedures   MEDICATIONS ORDERED IN ED: Medications - No data to display   IMPRESSION / MDM / ASSESSMENT AND PLAN / ED COURSE  I reviewed the triage vital signs and the nursing notes.                              Differential diagnosis includes, but is not limited to, periprosthetic right hip fracture  Patient's presentation is most consistent with acute presentation with potential threat to life or bodily function.  Patient is a 77 year old male presenting today for fall 5 days ago with outpatient x-ray showing evidence of right sided periprosthetic hip fracture.  No pain symptoms elsewhere and did not hit head or neck on fall.  Now unable to ambulate.  Otherwise, neurovascularly intact to the right lower extremity.  Spoke with orthopedics who recommends CT pelvis to evaluate severity of fracture.  They stated this could indicate need for transfer to higher level of care.  Plan to reconsult after results of CT to discuss admission here versus transfer.  Patient signed out to Dr. Rosalia Hammers pending results of CT and discussion with Ortho.  The patient is on the cardiac monitor to evaluate for evidence of arrhythmia and/or significant heart rate changes. Clinical Course as of 03/02/23 1526  Fri Mar 02, 2023  1422 CBC with Differential(!) Mild anemia compared to 2 days ago. [DW]  1511 Orthopedics recommends CT pelvis to evaluate fracture to decide between admission here for care versus transfer to higher level of care.  Patient signed out pending results of CT pelvis [DW]    Clinical Course User Index [DW] Janith Lima, MD     FINAL CLINICAL IMPRESSION(S) / ED DIAGNOSES   Final diagnoses:  Displaced fracture of greater trochanter of right femur, initial encounter for closed fracture [facility])  Fall, initial encounter     Rx / DC Orders   ED Discharge Orders     None        Note:  This document was prepared using Dragon voice recognition software and may include  unintentional dictation errors.   Janith Lima, MD 03/02/23 [phone]

## 2023-03-02 NOTE — ED Provider Notes (Signed)
Care of this patient assumed from prior physician at 1500 pending CT imaging, discussion with orthopedics, and disposition. Please see prior physician note for further details.  Briefly this is a 77 year old male with history of esophageal cancer who presented with right hip pain after fall 5 days ago with outpatient x-Muath Hallam from 2 days ago demonstrating a periprosthetic right hip fracture.  Case was discussed with Dr. Allena Katz with orthopedics who recommended a CT of the pelvis to further evaluate.  I was notified by Dr. Allena Katz that he had reviewed the patient's CT and recommended nonoperative management.  However, he did feel that patient would benefit from admission for pain control and therapy services in the setting of his acute fracture.  Labs reviewed.  Stable mild leukocytosis, slightly downtrending anemia with hemoglobin of 8.3.  INR 2.1.  BMP without critical derangements.  PTT elevated at 42.  Patient reevaluated.  Discussed plan for admission and patient and wife are agreeable.  Will reach out to hospitalist team.  Reviewed with hospitalist team.  They will evaluate the patient for anticipated admission.   Trinna Post, MD 03/02/23 [phone]

## 2023-03-02 NOTE — ED Notes (Signed)
Pt dinner tray ordered.

## 2023-03-02 NOTE — ED Notes (Signed)
Hospitalist messaged in regards to pt blood pressure, prior to giving more pain medication

## 2023-03-02 NOTE — ED Notes (Signed)
Provider notified of b/p's running low. Current b/p 82/44. Awaiting orders

## 2023-03-02 NOTE — Consult Note (Signed)
ORTHOPAEDIC CONSULTATION  REQUESTING PHYSICIAN: Sunnie Nielsen, DO  Chief Complaint:   R hip pain  History of Present Illness: Aaron Short is a 77 y.o. male who had a fall at home approximately 5 days ago.  His pain continued to worsen over the this time until he got to the point where he can no longer ambulate.  He was seen by his primary care provider and radiographs were obtained, but not read until today.  Given his inability to ambulate and findings of periprosthetic greater trochanteric fracture, he came to the ED for further evaluation.  The patient is able to ambulate unassisted, but does use a walker.  He lives at home with his wife.  He has had bilateral hip arthroplasties and knee arthroplasties.  Right hip surgery was likely performed approximately 5 years ago.  He does take Xarelto for atrial fibrillation.  He also has a history of esophageal adenocarcinoma.  Past Medical History:  Diagnosis Date   Arthralgia of lower leg 04/02/2012   Arthralgia of upper arm 06/18/2009   Cancer (HCC)    Diabetes mellitus without complication (HCC)    FH: atrial fibrillation    Hypertension    Past Surgical History:  Procedure Laterality Date   bladder cancer surgery  2015   ESOPHAGOGASTRODUODENOSCOPY (EGD) WITH PROPOFOL N/A 10/30/2022   Procedure: ESOPHAGOGASTRODUODENOSCOPY (EGD) WITH PROPOFOL;  Surgeon: Toney Reil, MD;  Location: [facility] ENDOSCOPY;  Service: Gastroenterology;  Laterality: N/A;   IR IMAGING GUIDED PORT INSERTION  11/13/2022   JOINT REPLACEMENT     REPLACEMENT TOTAL HIP W/  RESURFACING IMPLANTS Bilateral    SHOULDER SURGERY Bilateral    TOTAL KNEE ARTHROPLASTY Bilateral    Social History   Socioeconomic History   Marital status: Married    Spouse name: Not on file   Number of children: Not on file   Years of education: Not on file   Highest education level: Not on file  Occupational  History   Not on file  Tobacco Use   Smoking status: Former    Types: Cigarettes   Smokeless tobacco: Never  Vaping Use   Vaping status: Never Used  Substance and Sexual Activity   Alcohol use: No    Alcohol/week: 0.0 standard drinks of alcohol   Drug use: No   Sexual activity: Not on file  Other Topics Concern   Not on file  Social History Narrative   Lives with wife, [name].    Social Determinants of Health   Financial Resource Strain: Low Risk  (11/03/2022)   Overall Financial Resource Strain (CARDIA)    Difficulty of Paying Living Expenses: Not very hard  Food Insecurity: No Food Insecurity (01/12/2023)   Hunger Vital Sign    Worried About Running Out of Food in the Last Year: Never true    Ran Out of Food in the Last Year: Never true  Transportation Needs: No Transportation Needs (01/12/2023)   PRAPARE - Administrator, Civil Service (Medical): No    Lack of Transportation (Non-Medical): No  Physical Activity: Sufficiently Active (02/16/2021)   Received from [facility], [facility]   Exercise Vital Sign    Days of Exercise per Week: 7 days    Minutes of Exercise per Session: 150+ min  Stress: No Stress Concern Present (11/03/2022)   [employer] of Occupational Health - Occupational Stress Questionnaire    Feeling of Stress : Only a little  Social Connections: Moderately Integrated (02/16/2021)   Received  from [facility], [facility]   Social Connection and Isolation Panel [NHANES]    Frequency of Communication with Friends and Family: Three times a week    Frequency of Social Gatherings with Friends and Family: Three times a week    Attends Religious Services: Never    Active Member of Clubs or Organizations: Yes    Attends Engineer, structural: More than 4 times per year    Marital Status: Married   Family History  Problem Relation Age of Onset   Dementia Mother    Heart disease Father    Allergies  Allergen Reactions    Diltiazem Swelling    Leg edema Leg edema Leg edema Leg edema Leg edema Leg edema Leg edema Leg edema Leg edema   Penicillins Hives and Rash    Had hives as child Other reaction(s): RASH Childhood allergy Other reaction(s): RASH Other reaction(s): RASH Childhood allergy Other reaction(s): RASH Other reaction(s): RASH Childhood allergy   Tizanidine Other (See Comments)    Other reaction(s): OTHER  Pt states it puts him out Other reaction(s): OTHER  Pt states it puts him out Other reaction(s): OTHER  Pt states it puts him out Other reaction(s): OTHER  Pt states it puts him out Other reaction(s): OTHER  Pt states it puts him out Other reaction(s): OTHER  Pt states it puts him out   Prior to Admission medications   Medication Sig Start Date End Date Taking? Authorizing Provider  B Complex-C (VITAMIN B + C COMPLEX) TABS Take 1 capsule by mouth every morning. Patient not taking: Reported on 02/21/2023    [provider]  carisoprodol (SOMA) 350 MG tablet Take 350 mg by mouth 2 (two) times daily.     [provider]  dexamethasone (DECADRON) 4 MG tablet Take 2 tablets (8 mg total) by mouth daily. Start the day after chemotherapy for 2 days. Take with food. Patient not taking: Reported on 02/21/2023 11/03/22   Rickard Patience, MD  dronabinol (MARINOL) 2.5 MG capsule Take 1 capsule (2.5 mg total) by mouth 2 (two) times daily before a meal. 02/15/23   Borders, Daryl Eastern, NP  HYDROmorphone (DILAUDID) 2 MG tablet Take 2-3 tablets (4-6 mg total) by mouth every 4 (four) hours as needed for severe pain. 02/28/23   Borders, Daryl Eastern, NP  lidocaine-prilocaine (EMLA) cream Apply to affected area once 11/03/22   Rickard Patience, MD  loperamide (IMODIUM) 2 MG capsule Take 1 capsule (2 mg total) by mouth See admin instructions. Initial: 4 mg,the 2 mg every 2 hours (4 mg every 4 hours at night)  maximum: 16 mg/day 11/22/22   Rickard Patience, MD  LORazepam (ATIVAN) 0.5 MG tablet Take 1 tablet (0.5 mg total) by  mouth every 8 (eight) hours as needed (nausea). 02/15/23   Borders, Daryl Eastern, NP  magic mouthwash w/lidocaine SOLN Take 5 mLs by mouth 4 (four) times daily as needed for mouth pain. Sig: Swish/Swallow 5-10 ml four times a day as needed. Dispense 480 ml. 1RF 01/10/23   Rickard Patience, MD  Magnesium 500 MG TABS Take 500 mg by mouth as needed. Patient not taking: Reported on 02/21/2023    [provider]  melatonin (MELATONIN MAXIMUM STRENGTH) 5 MG TABS Take 5 mg by mouth at bedtime as needed.    [provider]  naloxone [facility]) nasal spray 4 mg/0.1 mL Place 1 spray into the nose once for 1 dose. Spray half of bottle content into each nostril, then call  911 11/15/22 02/21/23  Borders, Daryl Eastern, NP  nystatin (MYCOSTATIN) 100000 UNIT/ML suspension Take 5 mLs (500,000 Units total) by mouth 4 (four) times daily. 02/07/23   Rickard Patience, MD  omeprazole (PRILOSEC) 10 MG capsule Take 20 mg by mouth daily.    [provider]  ondansetron (ZOFRAN) 8 MG tablet Take 1 tablet (8 mg total) by mouth every 8 (eight) hours as needed for nausea or vomiting. Start on the third day after chemotherapy. 12/27/22   Rickard Patience, MD  prazosin (MINIPRESS) 1 MG capsule Take 2 mg by mouth at bedtime. 10/01/20   [provider]  prochlorperazine (COMPAZINE) 10 MG tablet Take 1 tablet (10 mg total) by mouth every 6 (six) hours as needed for nausea or vomiting. 12/27/22   Rickard Patience, MD  rivaroxaban (XARELTO) 20 MG TABS tablet Take 20 mg by mouth daily. 03/26/17   [provider]  sertraline (ZOLOFT) 50 MG tablet Take 100 mg by mouth at bedtime. 10/05/22 02/21/23  [provider]   Recent Labs    02/28/23 0836 03/02/23 1413  WBC 10.7* 10.8*  HGB 9.5* 8.3*  HCT 28.2* 25.1*  PLT 333 299  K 3.9 3.6  CL 94* 98  CO2 24 25  BUN 24* 26*  CREATININE 0.73 0.62  GLUCOSE 146* 159*  CALCIUM 9.1 8.5*  INR  --  2.1*   CT PELVIS WO CONTRAST  Result Date: 03/02/2023 CLINICAL DATA:  Periprosthetic hip  fracture EXAM: CT PELVIS WITHOUT CONTRAST TECHNIQUE: Multidetector CT imaging of the pelvis was performed following the standard protocol without intravenous contrast. RADIATION DOSE REDUCTION: This exam was performed according to the departmental dose-optimization program which includes automated exposure control, adjustment of the mA and/or kV according to patient size and/or use of iterative reconstruction technique. COMPARISON:  Radiograph 02/28/2023, CT 01/31/2023, right hip radiograph 11/18/2015 FINDINGS: Urinary Tract: Partially obscured by artifact from hip hardware. Bladder nondistended Bowel:  No acute bowel wall thickening Vascular/Lymphatic: Moderate aortic atherosclerosis. No aneurysm. No suspicious lymph nodes. Reproductive: Limited by artifact. No obvious mass. Clips in the upper scrotum. Other:  Negative for pelvic effusion or free air. Musculoskeletal: Bilateral hip replacements with normal alignment but extensive artifact. Pubic symphysis and rami appear intact. SI joints are non widened. Acute appearing mildly displaced horizontally oriented fracture involves the greater trochanter of proximal right femur and extends to the proximal femoral rod. This is best seen on coronal reconstructions. Soft tissue edema and stranding lateral aspect of the right hip consistent with contusion and small hematoma. IMPRESSION: 1. Status post bilateral hip arthroplasties. Acute mildly displaced fracture involving the greater trochanter of proximal right femur with extension of fracture lucency to the proximal femoral rod. No dislocation. Overlying edema within the hip soft tissues and probable small soft tissue hematoma. Aortic Atherosclerosis (ICD10-I70.0). Electronically Signed   By: Jasmine Pang M.D.   On: 03/02/2023 17:29     Positive ROS: All other systems have been reviewed and were otherwise negative with the exception of those mentioned in the HPI and as above.  Physical Exam: BP (!) 88/48   Pulse  (!) 104   Temp 98.4 F (36.9 C) (Oral)   Resp 19   Ht 5\' 8"  (1.727 m)   Wt 55.8 kg   SpO2 (!) 87%   BMI 18.70 kg/m  General:  Alert, no acute distress Psychiatric:  Patient is competent for consent with normal mood and affect    Orthopedic Exam:  RLE: 5/5 DF/PF/EHL SILT grossly over  foot Foot wwp Negative log roll/axial load Significant tenderness to palpation over greater trochanter   Imaging:  As above: R periprosthetic fracture involving the greater trochanter.  Implant appears to be in a stable position  Assessment/Plan: Joss Mcdill is a 77 y.o. male with a R periprosthetic greater trochanteric fracture with stable appearing implant   1. I discussed the various treatment options including both surgical and non-surgical management of the fracture with the patient and his wife.  We agreed to proceed with further nonsurgical management as this fracture may well heal without any further intervention and the implant appeared to be in a stable position.  No current plan for surgery.  2.  Patient may weight-bear as tolerated on the right lower extremity.  Avoid all hip abduction.  Recommend obtaining a hip abduction brace.  May need to consult orthotics to have it delivered and fitted appropriately.  3.  PT/OT for mobilization.  Patient would strongly prefer to go home and will likely have 24-hour care from family.  4.  Patient may follow-up with [facility] clinic orthopedics in 1 week for repeat x-rays.  Will plan to follow peripherally while inpatient.  Please page with any further questions.   Signa Kell   03/02/2023 8:31 PM

## 2023-03-02 NOTE — ED Notes (Signed)
Per hospitalist give midodrine and get blood pressure up a little bit prior to administering more pain medication

## 2023-03-02 NOTE — H&P (Signed)
HISTORY AND PHYSICAL    Aaron Short   UUV:[id number] DOB: 1945/12/09   Date of Service: 03/02/23 Requesting physician/APP from ED: Treatment Team:  Attending Provider: Sunnie Nielsen, DO  PCP: Rosemarie Ax, MD     HPI: Aaron Short is a 77 y.o. male with esophageal adenocarcinoma, chronic pain secondary to neoplasm presenting today for right hip injury.  Patient had a fall 5 days ago at home with right hip pain.  He had worsening pain over the week while walking and got an x-ray 2 days ago w/ oncology/palliative visit.  They called him today stating the x-ray showed evidence of a periprosthetic right hip fracture. Came to the ED for further evaluation. Reports difficulty w/ ambulation. No N/V, some dry coughing, no headache or vision change  In ED, labs largely stable, Hypotensive appears chronic, CT hip per EDP reviewed by ortho.     Consultants:  Orthopedics - EDP spoke w/ Dr Allena Katz, per report from EDP he reviewed CT and this can be managed non-operatively  Procedures: none      ASSESSMENT & PLAN:   R hip periprosthetic fracture  Pain control - home dilaudid po + IV dilaudid prn Orthopedics to confirm outpatient follow up PT/OT to follow GOAL IS FOR HOME with family NOT rehab at SNF - family need some time to arrange help and will likely need assistance getting DME. Pt/wife are aware we are keeping on observation status to help with this goal but plan is for likely discharge tomorrow pending further developments   Esophageal Adenocarcinoma Not currently on chemotherapy d/t frailty  Pain control as above Outpatient f/u   Afib Appears in sinus at this time Maintain Xarelto No rate control meds d/t hypotension  Telemetry 12 hours and if no events may d/c   Hypotension, chronic Trial midodrine   Cough, dry, mild CXR   Macrocytic anemia Chronic but Hgb a bit lower here Likely chronic inflammation d/t cancer Monitor CBC If  Hgb dropping / if bleeding would hold Xarelto   Severe Protein Calorie Malnutrition Regular diet Follow outpatient w/ dietician  Elevated INR 2.1 not on coumadin Question liver disease given recent abn LFT CMP in AM     DVT prophylaxis: Xarelto Pertinent IV fluids/nutrition: no continuous IV fluids, regular diet  Central lines / invasive devices: none  Code Status: DNR - pt does NOT want to receive chest compressions or intubation/mechanical ventilation  Family Communication: wife at bedside in ED on admission   Disposition: observation TOC needs: home health, DME  Barriers to discharge / significant pending items: arrange home health and DME                   Review of Systems:  Review of Systems  Constitutional:  Positive for malaise/fatigue and weight loss. Negative for chills and fever.  HENT:  Negative for sinus pain and sore throat.   Respiratory:  Positive for cough and shortness of breath. Negative for hemoptysis and sputum production.   Cardiovascular:  Negative for chest pain, palpitations and leg swelling.  Gastrointestinal:  Negative for abdominal pain, nausea and vomiting.  Musculoskeletal:  Positive for back pain and joint pain.  Neurological:  Positive for weakness. Negative for dizziness, tremors and focal weakness.       has a past medical history of Arthralgia of lower leg (04/02/2012), Arthralgia of upper arm (06/18/2009), Cancer (HCC), Diabetes mellitus without complication (HCC), FH: atrial fibrillation, and Hypertension.   Social History   Tobacco Use  Smoking status: Former    Types: Cigarettes   Smokeless tobacco: Never  Vaping Use   Vaping status: Never Used  Substance Use Topics   Alcohol use: No    Alcohol/week: 0.0 standard drinks of alcohol   Drug use: No     No current facility-administered medications on file prior to encounter.   Current Outpatient Medications on File Prior to Encounter  Medication Sig Dispense  Refill   B Complex-C (VITAMIN B + C COMPLEX) TABS Take 1 capsule by mouth every morning. (Patient not taking: Reported on 02/21/2023)     carisoprodol (SOMA) 350 MG tablet Take 350 mg by mouth 2 (two) times daily.      dexamethasone (DECADRON) 4 MG tablet Take 2 tablets (8 mg total) by mouth daily. Start the day after chemotherapy for 2 days. Take with food. (Patient not taking: Reported on 02/21/2023) 30 tablet 1   dronabinol (MARINOL) 2.5 MG capsule Take 1 capsule (2.5 mg total) by mouth 2 (two) times daily before a meal. 60 capsule 0   HYDROmorphone (DILAUDID) 2 MG tablet Take 2-3 tablets (4-6 mg total) by mouth every 4 (four) hours as needed for severe pain.     lidocaine-prilocaine (EMLA) cream Apply to affected area once 30 g 3   loperamide (IMODIUM) 2 MG capsule Take 1 capsule (2 mg total) by mouth See admin instructions. Initial: 4 mg,the 2 mg every 2 hours (4 mg every 4 hours at night)  maximum: 16 mg/day 60 capsule 2   LORazepam (ATIVAN) 0.5 MG tablet Take 1 tablet (0.5 mg total) by mouth every 8 (eight) hours as needed (nausea). 90 tablet 0   magic mouthwash w/lidocaine SOLN Take 5 mLs by mouth 4 (four) times daily as needed for mouth pain. Sig: Swish/Swallow 5-10 ml four times a day as needed. Dispense 480 ml. 1RF 480 mL 1   Magnesium 500 MG TABS Take 500 mg by mouth as needed. (Patient not taking: Reported on 02/21/2023)     melatonin (MELATONIN MAXIMUM STRENGTH) 5 MG TABS Take 5 mg by mouth at bedtime as needed.     naloxone (NARCAN) nasal spray 4 mg/0.1 mL Place 1 spray into the nose once for 1 dose. Spray half of bottle content into each nostril, then call 911 2 each 0   nystatin (MYCOSTATIN) 100000 UNIT/ML suspension Take 5 mLs (500,000 Units total) by mouth 4 (four) times daily. 473 mL 1   omeprazole (PRILOSEC) 10 MG capsule Take 20 mg by mouth daily.     ondansetron (ZOFRAN) 8 MG tablet Take 1 tablet (8 mg total) by mouth every 8 (eight) hours as needed for nausea or vomiting. Start on  the third day after chemotherapy. 90 tablet 1   prazosin (MINIPRESS) 1 MG capsule Take 2 mg by mouth at bedtime.     prochlorperazine (COMPAZINE) 10 MG tablet Take 1 tablet (10 mg total) by mouth every 6 (six) hours as needed for nausea or vomiting. 90 tablet 1   rivaroxaban (XARELTO) 20 MG TABS tablet Take 20 mg by mouth daily.     sertraline (ZOLOFT) 50 MG tablet Take 100 mg by mouth at bedtime.       Allergies  Allergen Reactions   Diltiazem Swelling    Leg edema Leg edema Leg edema Leg edema Leg edema Leg edema Leg edema Leg edema Leg edema   Penicillins Hives and Rash    Had hives as child Other reaction(s): RASH Childhood allergy Other reaction(s): RASH Other reaction(s): RASH  Childhood allergy Other reaction(s): RASH Other reaction(s): RASH Childhood allergy   Tizanidine Other (See Comments)    Other reaction(s): OTHER  Pt states it puts him out Other reaction(s): OTHER  Pt states it puts him out Other reaction(s): OTHER  Pt states it puts him out Other reaction(s): OTHER  Pt states it puts him out Other reaction(s): OTHER  Pt states it puts him out Other reaction(s): OTHER  Pt states it puts him out      family history includes Dementia in his mother; Heart disease in his father.  Past Surgical History:  Procedure Laterality Date   bladder cancer surgery  2015   ESOPHAGOGASTRODUODENOSCOPY (EGD) WITH PROPOFOL N/A 10/30/2022   Procedure: ESOPHAGOGASTRODUODENOSCOPY (EGD) WITH PROPOFOL;  Surgeon: Toney Reil, MD;  Location: [facility] ENDOSCOPY;  Service: Gastroenterology;  Laterality: N/A;   IR IMAGING GUIDED PORT INSERTION  11/13/2022   JOINT REPLACEMENT     REPLACEMENT TOTAL HIP W/  RESURFACING IMPLANTS Bilateral    SHOULDER SURGERY Bilateral    TOTAL KNEE ARTHROPLASTY Bilateral           Objective Findings:  Vitals:   03/02/23 1500 03/02/23 1530 03/02/23 1630 03/02/23 1700  BP: (!) 85/48 (!) 85/56 (!) 89/61 (!) 94/49  Pulse: 94 (!) 101 92 92   Resp: 17 17 17 15   Temp:      TempSrc:      SpO2: 94% 93% 97% 94%  Weight:      Height:       No intake or output data in the 24 hours ending 03/02/23 1743 Filed Weights   03/02/23 1402  Weight: 55.8 kg    Examination:  Physical Exam Constitutional:      General: He is not in acute distress.    Appearance: He is ill-appearing.     Comments: thin  HENT:     Head: Normocephalic and atraumatic.  Eyes:     Extraocular Movements: Extraocular movements intact.  Cardiovascular:     Rate and Rhythm: Normal rate and regular rhythm.     Heart sounds: Normal heart sounds.  Pulmonary:     Effort: Pulmonary effort is normal. No respiratory distress.     Breath sounds: Normal breath sounds.  Abdominal:     General: Abdomen is flat.     Palpations: Abdomen is soft.  Musculoskeletal:     Right lower leg: No edema.     Left lower leg: No edema.  Skin:    General: Skin is warm and dry.     Coloration: Skin is pale.  Neurological:     General: No focal deficit present.     Mental Status: He is alert and oriented to person, place, and time.  Psychiatric:        Mood and Affect: Mood normal.        Behavior: Behavior normal.          Scheduled Medications:   dronabinol  2.5 mg Oral BID AC   melatonin  5 mg Oral QHS   pantoprazole  40 mg Oral Daily   rivaroxaban  20 mg Oral Daily   sertraline  100 mg Oral QHS   sodium chloride flush  3 mL Intravenous Q12H    Continuous Infusions:  sodium chloride      PRN Medications:  sodium chloride, HYDROmorphone (DILAUDID) injection, HYDROmorphone, loperamide, LORazepam, magic mouthwash w/lidocaine, ondansetron **OR** ondansetron (ZOFRAN) IV, senna-docusate, sodium chloride flush  Antimicrobials:  Anti-infectives (From admission, onward)    None  Data Reviewed: I have personally reviewed following labs and imaging studies  CBC: Recent Labs  Lab 02/28/23 0836 03/02/23 1413  WBC 10.7* 10.8*  NEUTROABS  7.8* 8.9*  HGB 9.5* 8.3*  HCT 28.2* 25.1*  MCV 105.2* 105.9*  PLT 333 299   Basic Metabolic Panel: Recent Labs  Lab 02/28/23 0836 03/02/23 1413  NA 134* 134*  K 3.9 3.6  CL 94* 98  CO2 24 25  GLUCOSE 146* 159*  BUN 24* 26*  CREATININE 0.73 0.62  CALCIUM 9.1 8.5*   GFR: Estimated Creatinine Clearance: 59.1 mL/min (by C-G formula based on SCr of 0.62 mg/dL). Liver Function Tests: Recent Labs  Lab 02/28/23 0836  AST 57*  ALT 24  ALKPHOS 144*  BILITOT 0.5  PROT 6.4*  ALBUMIN 2.9*   No results for input(s): "LIPASE", "AMYLASE" in the last 168 hours. No results for input(s): "AMMONIA" in the last 168 hours. Coagulation Profile: Recent Labs  Lab 03/02/23 1413  INR 2.1*   Cardiac Enzymes: No results for input(s): "CKTOTAL", "CKMB", "CKMBINDEX", "TROPONINI" in the last 168 hours. BNP (last 3 results) No results for input(s): "PROBNP" in the last 8760 hours. HbA1C: No results for input(s): "HGBA1C" in the last 72 hours. CBG: No results for input(s): "GLUCAP" in the last 168 hours. Lipid Profile: No results for input(s): "CHOL", "HDL", "LDLCALC", "TRIG", "CHOLHDL", "LDLDIRECT" in the last 72 hours. Thyroid Function Tests: No results for input(s): "TSH", "T4TOTAL", "FREET4", "T3FREE", "THYROIDAB" in the last 72 hours. Anemia Panel: No results for input(s): "VITAMINB12", "FOLATE", "FERRITIN", "TIBC", "IRON", "RETICCTPCT" in the last 72 hours. Most Recent Urinalysis On File:     Component Value Date/Time   COLORURINE YELLOW (A) 01/11/2023 2335   APPEARANCEUR HAZY (A) 01/11/2023 2335   APPEARANCEUR CLOUDY 09/14/2013 1214   LABSPEC 1.010 01/11/2023 2335   LABSPEC 1.010 09/14/2013 1214   PHURINE 6.0 01/11/2023 2335   GLUCOSEU NEGATIVE 01/11/2023 2335   GLUCOSEU NEGATIVE 09/14/2013 1214   HGBUR NEGATIVE 01/11/2023 2335   BILIRUBINUR NEGATIVE 01/11/2023 2335   BILIRUBINUR NEGATIVE 09/14/2013 1214   KETONESUR NEGATIVE 01/11/2023 2335   PROTEINUR NEGATIVE 01/11/2023  2335   NITRITE NEGATIVE 01/11/2023 2335   LEUKOCYTESUR NEGATIVE 01/11/2023 2335   LEUKOCYTESUR 1+ 09/14/2013 1214   Sepsis Labs: @LABRCNTIP (procalcitonin:4,lacticidven:4)  No results found for this or any previous visit (from the past 240 hour(s)).       Radiology Studies: CT PELVIS WO CONTRAST  Result Date: 03/02/2023 CLINICAL DATA:  Periprosthetic hip fracture EXAM: CT PELVIS WITHOUT CONTRAST TECHNIQUE: Multidetector CT imaging of the pelvis was performed following the standard protocol without intravenous contrast. RADIATION DOSE REDUCTION: This exam was performed according to the departmental dose-optimization program which includes automated exposure control, adjustment of the mA and/or kV according to patient size and/or use of iterative reconstruction technique. COMPARISON:  Radiograph 02/28/2023, CT 01/31/2023, right hip radiograph 11/18/2015 FINDINGS: Urinary Tract: Partially obscured by artifact from hip hardware. Bladder nondistended Bowel:  No acute bowel wall thickening Vascular/Lymphatic: Moderate aortic atherosclerosis. No aneurysm. No suspicious lymph nodes. Reproductive: Limited by artifact. No obvious mass. Clips in the upper scrotum. Other:  Negative for pelvic effusion or free air. Musculoskeletal: Bilateral hip replacements with normal alignment but extensive artifact. Pubic symphysis and rami appear intact. SI joints are non widened. Acute appearing mildly displaced horizontally oriented fracture involves the greater trochanter of proximal right femur and extends to the proximal femoral rod. This is best seen on coronal reconstructions. Soft tissue edema and stranding lateral aspect of the  right hip consistent with contusion and small hematoma. IMPRESSION: 1. Status post bilateral hip arthroplasties. Acute mildly displaced fracture involving the greater trochanter of proximal right femur with extension of fracture lucency to the proximal femoral rod. No dislocation. Overlying  edema within the hip soft tissues and probable small soft tissue hematoma. Aortic Atherosclerosis (ICD10-I70.0). Electronically Signed   By: Jasmine Pang M.D.   On: 03/02/2023 17:29   DG HIP UNILAT W OR W/O PELVIS 2-3 VIEWS RIGHT  Result Date: 03/01/2023 CLINICAL DATA:  Larey Seat on Saturday right hip pain with decreased mobility EXAM: DG HIP (WITH OR WITHOUT PELVIS) 2-3V RIGHT COMPARISON:  CT 01/31/2023 FINDINGS: SI joints are non widened. Pubic symphysis and rami appear intact. Bilateral hip replacements with normal alignment. Acute periprosthetic fracture involving the inferior aspect of the greater trochanter with about 9 mm fracture gap in the cranial caudal direction. IMPRESSION: Status post bilateral hip replacements. Acute mildly displaced right proximal femoral periprosthetic fracture. Electronically Signed   By: Jasmine Pang M.D.   On: 03/01/2023 16:03   NM Cardiac Muga Rest  Result Date: 03/01/2023 CLINICAL DATA:  Esophageal carcinoma cancer. Evaluate cardiac function in relation to chemotherapy. EXAM: NUCLEAR MEDICINE CARDIAC BLOOD POOL IMAGING (MUGA) TECHNIQUE: Cardiac multi-gated acquisition was performed at rest following intravenous injection of Tc-13m labeled red blood cells. RADIOPHARMACEUTICALS:  21.24 mCi Tc-15m pertechnetate in-vitro labeled red blood cells IV COMPARISON:  None Available. FINDINGS: No  focal wall motion abnormality of the left ventricle. Calculated left ventricular ejection fraction equals 58.8% IMPRESSION: Left ventricular ejection fraction equals58.8 %. Electronically Signed   By: Genevive Bi M.D.   On: 03/01/2023 08:17             LOS: 0 days      Sunnie Nielsen, DO [facility] 03/02/2023, 5:43 PM    Dictation software may have been used to generate the above note. Typos may occur and escape review in typed/dictated notes. Please contact Dr Lyn Hollingshead directly for clarity if needed.  Staff may message me via secure chat in Epic  but  this may not receive an immediate response,  please page me for urgent matters!  If 7PM-7AM, please contact night coverage [email or web address]

## 2023-03-02 NOTE — ED Notes (Signed)
Provider notified: "FYI- Patient here for fx of right hip. Patient is c/o pain 10/10. His b/p have been running low. 90/52 (63),hr 97, 91% RA. Patient was given midodrine at 1822. "

## 2023-03-02 NOTE — ED Notes (Signed)
Pt eating dinner at this time

## 2023-03-03 DIAGNOSIS — S72001A Fracture of unspecified part of neck of right femur, initial encounter for closed fracture: Secondary | ICD-10-CM | POA: Diagnosis not present

## 2023-03-03 MED ORDER — SODIUM CHLORIDE 0.9 % IV BOLUS
250.0000 mL | Freq: Once | INTRAVENOUS | Status: AC
  Administered 2023-03-03: 250 mL via INTRAVENOUS

## 2023-03-03 MED ORDER — ENSURE ENLIVE PO LIQD
237.0000 mL | Freq: Two times a day (BID) | ORAL | Status: DC
  Administered 2023-03-03 – 2023-03-04 (×2): 237 mL via ORAL

## 2023-03-03 MED ORDER — SODIUM CHLORIDE 0.9 % IV BOLUS
500.0000 mL | Freq: Once | INTRAVENOUS | Status: AC
  Administered 2023-03-03: 500 mL via INTRAVENOUS

## 2023-03-03 NOTE — Evaluation (Signed)
Physical Therapy Evaluation Patient Details Name: Aaron Short MRN: [id number] DOB: 20-Mar-1946 Today's Date: 03/03/2023  History of Present Illness  Aaron Short is a 77 y.o. male who had a fall at home approximately 5 days ago.  His pain continued to worsen over the this time until he got to the point where he can no longer ambulate.  He was seen by his primary care provider and radiographs were obtained, but not read until today.  Given his inability to ambulate and findings of periprosthetic greater trochanteric fracture, he came to the ED for further evaluation.  He has had bilateral hip arthroplasties and knee arthroplasties.  Right hip surgery was likely performed approximately 5 years ago.  He does take Xarelto for atrial fibrillation.  He also has a history of esophageal adenocarcinoma. Per Dr. Zettie Cooley consultation: Patient may weight-bear as tolerated on the right lower extremity.  Avoid all hip abduction.  Per Dr. Signa Kell instant message brace should be set at 0 degrees abduction and 0-90 degrees ext/flex  and worn at all times unless he is not tolerating it, at which time it can be removed while in bed as long as patient does not abduct his right hip (03/03/2023);   Clinical Impression  PT/OT co-evaluation completed due to complexity of patient presentation, for patient/clinician safety, and to ensure precautions are maintained. Patient received with patient reclining in bed with wife, [name], at bedside. She is a retired Engineer, civil (consulting) and contributed to history. Patient alert and follows commands well, but demonstrates mild confusion at times requiring multiple cues. Session interrupted several times for room transfers and equipment issues, including lack of clarity on hip abduction brace settings and wearing.  Patient lives in a 1 story home with 4 steps to enter with handrails (unable to reach both). Prior to hospitalization he was ambulating short distances with a RW and  fell (fracturing his hip) when he attempted to ambulate without it. [name] reports she attempted several times unsuccessfully to get home health PT to help him with strength and balance training prior to the fall. Patient is dependent on help from [facility] for all aspects of care prior to hospitalization. Upon PT evaluation, patient required minA+2 for bed mobility and transfers, with one person helping to maintain precautions while the other supported trunk. Also transferred mod A +2 with RW. Demonstrates posterior lean and requires heavy cuing for hand placement and positioning during transfers. Patient was unable to take effective steps for walking due to difficulty with weight bearing on R LE due to pain. Patient's family is motivated to take him home and he will have +2 support at home with son who is strong helping. Patient is unable to ambulate more than a few steps or navigate stairs and will need EMS assistance to get inside his home. He will benefit from continued PT upon discharge from hospital setting. Hip abduction brace from [employer] arrived at end of session and PT required mod A for rolling while it was applied by [employer] rep. Patient would benefit from skilled physical therapy to address impairments and functional limitations (see PT Problem List below) to work towards stated goals and return to PLOF or maximal functional independence.       If plan is discharge home, recommend the following: Direct supervision/assist for financial management;Help with stairs or ramp for entrance;Direct supervision/assist for medications management;Two people to help with walking and/or transfers;Two people to help with bathing/dressing/bathroom;Assistance with cooking/housework   Can travel by private vehicle  Equipment Recommendations BSC/3in1;Hospital bed  Recommendations for Other Services  OT consult    Functional Status Assessment Patient has had a recent decline in their functional status and  demonstrates the ability to make significant improvements in function in a reasonable and predictable amount of time.     Precautions / Restrictions Precautions Precaution Comments: Per Dr. Zettie Cooley consultation (03/02/2023): Patient may weight-bear as tolerated on the right lower extremity. Avoid all hip abduction. Per Dr. Signa Kell instant message (03/03/2023): brace should be set at 0 degrees abduction and 0-90 degrees ext/flex and worn at all times unless he is not tolerating it, at which time it can be removed while in bed as long as patient does not abduct his right hip. Restrictions Weight Bearing Restrictions: Yes RLE Weight Bearing: Weight bearing as tolerated Other Position/Activity Restrictions: Per Dr. Zettie Cooley consultation (03/02/2023): Patient may weight-bear as tolerated on the right lower extremity. Avoid all hip abduction. Per Dr. Signa Kell instant message (03/03/2023): brace should be set at 0 degrees abduction and 0-90 degrees ext/flex and worn at all times unless he is not tolerating it, at which time it can be removed while in bed as long as patient does not abduct his right hip.      Mobility  Bed Mobility Overal bed mobility: Needs Assistance Bed Mobility: Supine to Sit     Supine to sit: Min assist, +2 for physical assistance, +2 for safety/equipment     General bed mobility comments: patient required min A +2 to go supine to sit at edge of bed with PT assisting at R LE to maintain precautions and help move leg off edge of bed while OT supported trunk. He became lightheaded and required assistance to maintain sitting and prevent LOB backwards for several seconds until this cleared.    Transfers Overall transfer level: Needs assistance Equipment used: Rolling walker (2 wheels) Transfers: Sit to/from Stand Sit to Stand: From elevated surface, Min assist, Mod assist, +2 safety/equipment, +2 physical assistance           General transfer comment: Patient  completed sit <> stand transfer at edge of bed and then bed to chair with minA +2, he then transfered chair to [facility] with mod A  +1, patient had difficulty taking small steps on R LE during transfer and had a backward lean and stooped posture. He required heavy cuing for hand placement. Bed adjusted to approximate height at home.    Ambulation/Gait               General Gait Details: Patient did not walk more than a few steps while transferring chair<>BSC<>bed.  Stairs            Wheelchair Mobility     Tilt Bed    Modified Rankin (Stroke Patients Only)       Balance Overall balance assessment: Needs assistance, History of Falls Sitting-balance support: No upper extremity supported, Feet supported Sitting balance-Leahy Scale: Fair Sitting balance - Comments: Patient with difficulty sitting at edge of bed at first due to lightheadedness and required min A to maintain this position at first, then he was able to maintain trunk control but not accept challenge. Fatigues quickly. Postural control: Posterior lean Standing balance support: Bilateral upper extremity supported, During functional activity, Reliant on assistive device for balance Standing balance-Leahy Scale: Poor Standing balance comment: Patient needed intermittant support to maintain standing position at RW with some posterior lean neccesitating min-modA to prevent backwards fall when standing and transferring.  Pertinent Vitals/Pain Pain Assessment Pain Assessment: Faces Faces Pain Scale: Hurts even more Pain Location: right hip with weight bearing or flexion Pain Descriptors / Indicators: Discomfort, Crying, Guarding, Grimacing Pain Intervention(s): Limited activity within patient's tolerance, Monitored during session, Patient requesting pain meds-RN notified, RN gave pain meds during session, Repositioned    Home Living Family/patient expects to be discharged to::  Private residence Living Arrangements: Spouse/significant other;Other relatives (son is coming to assist and stay with them as long as needed. Planned arrival is 03/04/2023. Wife is a retired Engineer, civil (consulting).) Available Help at Discharge: Family;Available 24 hours/day Type of Home: House Home Access: Stairs to enter Entrance Stairs-Rails: Doctor, general practice of Steps: 4 (from garage or front)   Home Layout: One level Home Equipment: Agricultural consultant (2 wheels);Cane - single point;Shower seat;Wheelchair - manual Additional Comments: w/c has small wheels, has tempurpedic bed that moves up and down at head and foot    Prior Function Prior Level of Function : Needs assist             Mobility Comments: ambulates short distances with RW prior to fall last week (which happened when he tried to ambulate with AD). ADLs Comments: Patient assists with all aspects of care     Extremity/Trunk Assessment   Upper Extremity Assessment Upper Extremity Assessment: Defer to OT evaluation    Lower Extremity Assessment Lower Extremity Assessment: RLE deficits/detail;Generalized weakness RLE Deficits / Details: difficulty with weight bearing and AROM at R LE due to pain/weakness RLE: Unable to fully assess due to pain    Cervical / Trunk Assessment Cervical / Trunk Assessment: Kyphotic  Communication   Communication Communication: No apparent difficulties;Hearing impairment (hard of hearing) Cueing Techniques: Verbal cues;Gestural cues;Tactile cues;Visual cues  Cognition Arousal: Alert Behavior During Therapy: WFL for tasks assessed/performed                                   General Comments: Seems intermittantly mildly confused        General Comments      Exercises Other Exercises Other Exercises: Patient and wife [name] educated on role of PT in acute care setting, discharge reccomendations, safe mobility techniques. Patient practiced transfers and rolling while  using BSC, donning underwear, and donning hip abduction brace.   Assessment/Plan    PT Assessment Patient needs continued PT services  PT Problem List Decreased strength;Decreased mobility;Decreased range of motion;Decreased coordination;Decreased knowledge of precautions;Decreased activity tolerance;Decreased skin integrity;Decreased balance;Decreased knowledge of use of DME;Pain       PT Treatment Interventions DME instruction;Therapeutic exercise;Gait training;Balance training;Stair training;Neuromuscular re-education;Functional mobility training;Therapeutic activities;Patient/family education    PT Goals (Current goals can be found in the Care Plan section)  Acute Rehab PT Goals Patient Stated Goal: get better and go home PT Goal Formulation: With patient/family Time For Goal Achievement: 03/17/23 Potential to Achieve Goals: Fair    Frequency Min 1X/week     Co-evaluation PT/OT/SLP Co-Evaluation/Treatment: Yes Reason for Co-Treatment: Complexity of the patient's impairments (multi-system involvement);To address functional/ADL transfers;For patient/therapist safety PT goals addressed during session: Mobility/safety with mobility;Balance;Proper use of DME;Strengthening/ROM OT goals addressed during session: ADL's and self-care;Proper use of Adaptive equipment and DME;Strengthening/ROM       AM-PAC PT "6 Clicks" Mobility  Outcome Measure Help needed turning from your back to your side while in a flat bed without using bedrails?: A Little Help needed moving from lying on your back to sitting on the  side of a flat bed without using bedrails?: A Little Help needed moving to and from a bed to a chair (including a wheelchair)?: A Lot Help needed standing up from a chair using your arms (e.g., wheelchair or bedside chair)?: A Lot Help needed to walk in hospital room?: A Lot Help needed climbing 3-5 steps with a railing? : Total 6 Click Score: 13    End of Session Equipment  Utilized During Treatment: Gait belt;Other (comment) (R hip abduction brace donned at end of session) Activity Tolerance: Patient limited by fatigue;Patient tolerated treatment well;Patient limited by pain Patient left: in chair;with call bell/phone within reach;with chair alarm set;with family/visitor present;with nursing/sitter in room Nurse Communication: Mobility status PT Visit Diagnosis: Muscle weakness (generalized) (M62.81);History of falling (Z91.81);Difficulty in walking, not elsewhere classified (R26.2);Pain Pain - Right/Left: Right Pain - part of body: Hip    Time: 1326 - 1340 and 1426 -1536 PT Time Calculation (min) (ACUTE ONLY): 84 min   Charges:   PT Evaluation $PT Eval Moderate Complexity: 1 Mod PT Treatments $Therapeutic Activity: 23-37 mins PT General Charges $$ ACUTE PT VISIT: 1 Visit         Huntley Dec R. Ilsa Iha, PT, DPT 03/03/23, 4:20 PM

## 2023-03-03 NOTE — Progress Notes (Signed)
PROGRESS NOTE    Mordechai Weise   XLK:[id number] DOB: May 22, 1946  DOA: 03/02/2023 Date of Service: 03/03/23 which is hospital day 0  PCP: Rosemarie Ax, MD    HPI: Aaron Short is a 77 y.o. male with esophageal adenocarcinoma, chronic pain secondary to neoplasm presenting today for right hip injury.  Patient had a fall 5 days ago at home with right hip pain.  He had worsening pain over the week while walking and got an x-ray 2 days ago w/ oncology/palliative visit.  They called him today stating the x-ray showed evidence of a periprosthetic right hip fracture. Came to the ED for further evaluation. Reports difficulty w/ ambulation. No N/V, some dry coughing, no headache or vision change   Hospital course:  10/04: In ED, labs largely stable, Hypotensive appears chronic, CT hip per EDP reviewed by ortho - opt for nonsurgical management. Admitted to hospitalist service as pt nonambulatory - plan for PT/OT and HH vs SNF w/ TOC 10/05: pending delivery of equipment and arranging home health        Consultants:  Orthopedics - EDP spoke w/ Dr Allena Katz, per report from EDP he reviewed CT and this can be managed non-operatively   Procedures: none           ASSESSMENT & PLAN:   R hip periprosthetic fracture  Pain control - home dilaudid po + IV dilaudid prn Orthopedics follow up outpatient in 1 week  Hip abduction brace  PT/OT to follow   Esophageal Adenocarcinoma Not currently on chemotherapy d/t frailty  Pain control as above Outpatient f/u    Afib Appears in sinus at this time Maintain Xarelto No rate control meds d/t hypotension  Telemetry 12 hours and if no events may d/c    Hypotension, chronic Trial midodrine    Cough, dry, mild CXR    Macrocytic anemia Chronic but Hgb a bit lower here Likely chronic inflammation d/t cancer Monitor CBC If Hgb dropping / if bleeding would hold Xarelto    Severe Protein Calorie Malnutrition Regular  diet Follow outpatient w/ dietician   Elevated INR 2.1 not on coumadin Question liver disease given recent abn LFT CMP follow outpatient          DVT prophylaxis: Xarelto Pertinent IV fluids/nutrition: no continuous IV fluids, regular diet  Central lines / invasive devices: none   Code Status: DNR - pt does NOT want to receive chest compressions or intubation/mechanical ventilation  Family Communication: wife at bedside on rounds today    Disposition: observation TOC needs: home health, DME  Barriers to discharge / significant pending items: arrange home health and DME hopeful for d/c home tomorrow                  Subjective / Brief ROS:  Patient reports pain overall controlled  Denies CP/SOB.  Denies new weakness.  Tolerating diet.  Reports no concerns w/ urination/defecation.   Family Communication: wife at bedside on rounds     Objective Findings:  Vitals:   03/03/23 0600 03/03/23 0903 03/03/23 1307 03/03/23 1410  BP: (!) 101/55 (!) 93/53 (!) 97/47 (!) 98/59  Pulse: 90 94 93 94  Resp: (!) 24 18 18    Temp:  98 F (36.7 C) 97.8 F (36.6 C)   TempSrc:  Oral Oral   SpO2: 95% 93% 94% 94%  Weight:      Height:        Intake/Output Summary (Last 24 hours) at 03/03/2023 1552 Last data  filed at 03/02/2023 1845 Gross per 24 hour  Intake 1000 ml  Output --  Net 1000 ml   Filed Weights   03/02/23 1402  Weight: 55.8 kg    Examination:  Physical Exam Constitutional:      General: He is not in acute distress.    Comments: Frail, thin  Cardiovascular:     Rate and Rhythm: Normal rate and regular rhythm.  Pulmonary:     Effort: Pulmonary effort is normal.     Breath sounds: Normal breath sounds.  Abdominal:     General: Abdomen is flat.     Palpations: Abdomen is soft.  Musculoskeletal:     Right lower leg: No edema.     Left lower leg: No edema.  Neurological:     General: No focal deficit present.     Mental Status: He is alert and oriented  to person, place, and time. Mental status is at baseline.  Psychiatric:        Mood and Affect: Mood normal.        Behavior: Behavior normal.          Scheduled Medications:   dronabinol  2.5 mg Oral BID AC   feeding supplement  237 mL Oral BID BM   melatonin  5 mg Oral QHS   midodrine  5 mg Oral TID WC   pantoprazole  40 mg Oral Daily   rivaroxaban  20 mg Oral Q breakfast   sertraline  100 mg Oral QHS   sodium chloride flush  3 mL Intravenous Q12H    Continuous Infusions:  sodium chloride      PRN Medications:  sodium chloride, acetaminophen, HYDROmorphone (DILAUDID) injection, HYDROmorphone, loperamide, LORazepam, magic mouthwash w/lidocaine, ondansetron **OR** ondansetron (ZOFRAN) IV, senna-docusate, sodium chloride flush  Antimicrobials from admission:  Anti-infectives (From admission, onward)    None           Data Reviewed:  I have personally reviewed the following...  CBC: Recent Labs  Lab 02/28/23 0836 03/02/23 1413  WBC 10.7* 10.8*  NEUTROABS 7.8* 8.9*  HGB 9.5* 8.3*  HCT 28.2* 25.1*  MCV 105.2* 105.9*  PLT 333 299   Basic Metabolic Panel: Recent Labs  Lab 02/28/23 0836 03/02/23 1413  NA 134* 134*  K 3.9 3.6  CL 94* 98  CO2 24 25  GLUCOSE 146* 159*  BUN 24* 26*  CREATININE 0.73 0.62  CALCIUM 9.1 8.5*   GFR: Estimated Creatinine Clearance: 59.1 mL/min (by C-G formula based on SCr of 0.62 mg/dL). Liver Function Tests: Recent Labs  Lab 02/28/23 0836 03/02/23 1413  AST 57* 85*  ALT 24 23  ALKPHOS 144* 129*  BILITOT 0.5 0.5  PROT 6.4* 5.9*  ALBUMIN 2.9* 2.5*   No results for input(s): "LIPASE", "AMYLASE" in the last 168 hours. No results for input(s): "AMMONIA" in the last 168 hours. Coagulation Profile: Recent Labs  Lab 03/02/23 1413  INR 2.1*   Cardiac Enzymes: No results for input(s): "CKTOTAL", "CKMB", "CKMBINDEX", "TROPONINI" in the last 168 hours. BNP (last 3 results) No results for input(s): "PROBNP" in the  last 8760 hours. HbA1C: No results for input(s): "HGBA1C" in the last 72 hours. CBG: No results for input(s): "GLUCAP" in the last 168 hours. Lipid Profile: No results for input(s): "CHOL", "HDL", "LDLCALC", "TRIG", "CHOLHDL", "LDLDIRECT" in the last 72 hours. Thyroid Function Tests: No results for input(s): "TSH", "T4TOTAL", "FREET4", "T3FREE", "THYROIDAB" in the last 72 hours. Anemia Panel: No results for input(s): "VITAMINB12", "FOLATE", "FERRITIN", "TIBC", "  IRON", "RETICCTPCT" in the last 72 hours. Most Recent Urinalysis On File:     Component Value Date/Time   COLORURINE YELLOW (A) 01/11/2023 2335   APPEARANCEUR HAZY (A) 01/11/2023 2335   APPEARANCEUR CLOUDY 09/14/2013 1214   LABSPEC 1.010 01/11/2023 2335   LABSPEC 1.010 09/14/2013 1214   PHURINE 6.0 01/11/2023 2335   GLUCOSEU NEGATIVE 01/11/2023 2335   GLUCOSEU NEGATIVE 09/14/2013 1214   HGBUR NEGATIVE 01/11/2023 2335   BILIRUBINUR NEGATIVE 01/11/2023 2335   BILIRUBINUR NEGATIVE 09/14/2013 1214   KETONESUR NEGATIVE 01/11/2023 2335   PROTEINUR NEGATIVE 01/11/2023 2335   NITRITE NEGATIVE 01/11/2023 2335   LEUKOCYTESUR NEGATIVE 01/11/2023 2335   LEUKOCYTESUR 1+ 09/14/2013 1214   Sepsis Labs: @LABRCNTIP (procalcitonin:4,lacticidven:4) Microbiology: No results found for this or any previous visit (from the past 240 hour(s)).    Radiology Studies last 3 days: DG Chest Port 1 View  Result Date: 03/02/2023 CLINICAL DATA:  10031 Cough 10031 Cough, hx of with esophageal adenocarcinoma EXAM: PORTABLE CHEST 1 VIEW COMPARISON:  CT chest 01/31/2023, chest x-ray 02/11/2023 FINDINGS: Right chest wall Port-A-Cath with tip overlying superior cavoatrial junction. Interval increase in prominence of hilar regions. The heart and mediastinal contours are otherwise grossly unchanged. Atherosclerotic plaque. No focal consolidation. Chronic coarsened interstitial markings with no overt pulmonary edema. No pleural effusion. No pneumothorax. No  acute osseous abnormality. IMPRESSION: 1. Interval increase in prominence of hilar regions. Underlying lymphadenopathy not excluded. 2. Underlying known interstitial lung disease/pulmonary fibrosis. 3.  Aortic Atherosclerosis (ICD10-I70.0). Electronically Signed   By: Tish Frederickson M.D.   On: 03/02/2023 20:29   CT PELVIS WO CONTRAST  Result Date: 03/02/2023 CLINICAL DATA:  Periprosthetic hip fracture EXAM: CT PELVIS WITHOUT CONTRAST TECHNIQUE: Multidetector CT imaging of the pelvis was performed following the standard protocol without intravenous contrast. RADIATION DOSE REDUCTION: This exam was performed according to the departmental dose-optimization program which includes automated exposure control, adjustment of the mA and/or kV according to patient size and/or use of iterative reconstruction technique. COMPARISON:  Radiograph 02/28/2023, CT 01/31/2023, right hip radiograph 11/18/2015 FINDINGS: Urinary Tract: Partially obscured by artifact from hip hardware. Bladder nondistended Bowel:  No acute bowel wall thickening Vascular/Lymphatic: Moderate aortic atherosclerosis. No aneurysm. No suspicious lymph nodes. Reproductive: Limited by artifact. No obvious mass. Clips in the upper scrotum. Other:  Negative for pelvic effusion or free air. Musculoskeletal: Bilateral hip replacements with normal alignment but extensive artifact. Pubic symphysis and rami appear intact. SI joints are non widened. Acute appearing mildly displaced horizontally oriented fracture involves the greater trochanter of proximal right femur and extends to the proximal femoral rod. This is best seen on coronal reconstructions. Soft tissue edema and stranding lateral aspect of the right hip consistent with contusion and small hematoma. IMPRESSION: 1. Status post bilateral hip arthroplasties. Acute mildly displaced fracture involving the greater trochanter of proximal right femur with extension of fracture lucency to the proximal femoral rod.  No dislocation. Overlying edema within the hip soft tissues and probable small soft tissue hematoma. Aortic Atherosclerosis (ICD10-I70.0). Electronically Signed   By: Jasmine Pang M.D.   On: 03/02/2023 17:29   DG HIP UNILAT W OR W/O PELVIS 2-3 VIEWS RIGHT  Result Date: 03/01/2023 CLINICAL DATA:  Larey Seat on Saturday right hip pain with decreased mobility EXAM: DG HIP (WITH OR WITHOUT PELVIS) 2-3V RIGHT COMPARISON:  CT 01/31/2023 FINDINGS: SI joints are non widened. Pubic symphysis and rami appear intact. Bilateral hip replacements with normal alignment. Acute periprosthetic fracture involving the inferior aspect of the greater trochanter with  about 9 mm fracture gap in the cranial caudal direction. IMPRESSION: Status post bilateral hip replacements. Acute mildly displaced right proximal femoral periprosthetic fracture. Electronically Signed   By: Jasmine Pang M.D.   On: 03/01/2023 16:03   NM Cardiac Muga Rest  Result Date: 03/01/2023 CLINICAL DATA:  Esophageal carcinoma cancer. Evaluate cardiac function in relation to chemotherapy. EXAM: NUCLEAR MEDICINE CARDIAC BLOOD POOL IMAGING (MUGA) TECHNIQUE: Cardiac multi-gated acquisition was performed at rest following intravenous injection of Tc-5m labeled red blood cells. RADIOPHARMACEUTICALS:  21.24 mCi Tc-73m pertechnetate in-vitro labeled red blood cells IV COMPARISON:  None Available. FINDINGS: No  focal wall motion abnormality of the left ventricle. Calculated left ventricular ejection fraction equals 58.8% IMPRESSION: Left ventricular ejection fraction equals58.8 %. Electronically Signed   By: Genevive Bi M.D.   On: 03/01/2023 08:17         Sunnie Nielsen, DO [facility] 03/03/2023, 3:52 PM    Dictation software may have been used to generate the above note. Typos may occur and escape review in typed/dictated notes. Please contact Dr Lyn Hollingshead directly for clarity if needed.  Staff may message me via secure chat in Epic  but  this may not receive an immediate response,  please page me for urgent matters!  If 7PM-7AM, please contact night coverage [email or web address]

## 2023-03-03 NOTE — TOC Initial Note (Addendum)
Transition of Care [facility]) - Initial/Assessment Note    Patient Details  Name: Aaron Short MRN: [id number] Date of Birth: [birth year]  [facility] CM/SW Contact:    Colette Ribas, LCSWA Phone Number: 03/03/2023, 4:30 PM  Clinical Narrative:                  CSW spoke with MD, OT/PT Recommendations were HH at DC, and PT requested EMS transport due to stairs at patient's house. Reached out to [facility] with [facility] they will accept patient for PT/OT needs. Called [facility]. Time of arrival est unavailable.    EMS cancelled as MD advised he will not DC until tomorrow   Patient Goals and CMS Choice            Expected Discharge Plan and Services                                              Prior Living Arrangements/Services                       Activities of Daily Living   ADL Screening (condition at time of admission) Independently performs ADLs?: No Does the patient have a NEW difficulty with bathing/dressing/toileting/self-feeding that is expected to last >3 days?: Yes (Initiates electronic notice to provider for possible OT consult) Does the patient have a NEW difficulty with getting in/out of bed, walking, or climbing stairs that is expected to last >3 days?: Yes (Initiates electronic notice to provider for possible PT consult) Does the patient have a NEW difficulty with communication that is expected to last >3 days?: No Is the patient deaf or have difficulty hearing?: Yes Does the patient have difficulty seeing, even when wearing glasses/contacts?: No Does the patient have difficulty concentrating, remembering, or making decisions?: Yes  Permission Sought/Granted                  Emotional Assessment              Admission diagnosis:  Closed right hip fracture (HCC) [S72.001A] Fall, initial encounter [W19.XXXA] Displaced fracture of greater trochanter of right femur, initial encounter for closed fracture (HCC)  [S72.111A] Patient Active Problem List   Diagnosis Date Noted   Closed right hip fracture (HCC) 03/02/2023   Dizziness 02/07/2023   Acute encephalopathy 01/13/2023   Gram-negative pneumonia (HCC) 01/13/2023   Bladder cancer (HCC) 01/13/2023   Secondary hypercoagulability disorder (HCC) 01/13/2023   Ambulatory dysfunction 01/13/2023   Hyponatremia 01/13/2023   Normocytic anemia 01/13/2023   Chemotherapy-induced nausea 01/10/2023   Mucositis 01/10/2023   Closed compression fracture of body of L1 vertebra (HCC) 01/02/2023   Compression fracture of lumbar vertebra (HCC) 12/13/2022   Closed fracture of lumbar vertebral body (HCC) 12/04/2022   Traumatic rhabdomyolysis (HCC) 12/04/2022   Dysuria 11/29/2022   Chemotherapy induced diarrhea 11/22/2022   Encounter for antineoplastic chemotherapy 11/15/2022   Esophageal adenocarcinoma (HCC) 11/03/2022   Goals of care, counseling/discussion 11/03/2022   Protein calorie malnutrition (HCC) 11/03/2022   Esophageal mass 10/30/2022   Intercostal neuralgia 10/12/2022   Multiple closed fractures of ribs of left side 10/12/2022   Abnormal drug screen 10/05/2022   Tick bite 10/05/2022   Rib pain on left side 10/05/2022   Dysphagia 09/21/2022   Melena 03/23/2022   Osteoporosis 05/02/2021   Weight loss 12/29/2020  Nightmares associated with chronic post-traumatic stress disorder 12/09/2019   Current moderate episode of major depressive disorder without prior episode (HCC) 11/14/2019   Risk for falls 07/10/2019   T12 compression fracture, sequela 07/09/2019   Chronic shoulder pain (Left) 07/09/2019   Chronic left-sided thoracic back pain 03/10/2019   Acute pain of left shoulder 03/10/2019   Acute midline thoracic back pain 03/10/2019   Pharmacologic therapy 12/31/2018   Disorder of skeletal system 12/31/2018   Problems influencing health status 12/31/2018   DDD (degenerative disc disease), cervical 12/31/2018   DDD (degenerative disc disease),  lumbosacral 12/31/2018   Lumbosacral Grade 1 Retrolisthesis of L5/S1 12/31/2018   Neurogenic pain 12/31/2018   Chronic hip pain after THR (Bilateral) 12/31/2018   Strain of lumbar region 10/15/2017   Chronic wound of head 07/02/2017   Hematoma of leg, left, initial encounter 03/30/2017   Need for influenza vaccination 02/26/2017   Chronic acromioclavicular joint pain (Left) 09/05/2016   Osteoarthritis of shoulder (Bilateral) (L>R) 09/05/2016   Acromioclavicular arthrosis (Bilateral) (L>R) 09/05/2016   Arthralgia of acromioclavicular joint (Bilateral) (L>R) 09/05/2016   Obesity, Class I, BMI 30-34.9 05/11/2016   Chronic pain syndrome 05/11/2016   FH: atrial fibrillation    Smoker 01/07/2016   Disturbance of skin sensation 11/17/2015   Lumbar facet syndrome (Bilateral) (R>L) 08/16/2015   Lumbar spondylosis 08/16/2015   Chronic, continuous use of opioids 07/05/2015   Neoplasm related pain 05/18/2015   Long term prescription opiate use 05/18/2015   Opiate use (30 MME/Day) 05/18/2015   Encounter for therapeutic drug level monitoring 05/18/2015   Encounter for chronic pain management 05/18/2015   Opioid dependence, daily use (HCC) 05/18/2015   Chronic hip pain (Secondary area of Pain) (Bilateral) (R>L) 05/18/2015   Chronic shoulder pain (Primary Area of Pain) (Bilateral) (L>R) 05/18/2015   Chronic low back pain (Third area of Pain) (Bilateral) (R>L) 05/18/2015   Chronic knee pain (Bilateral) (R>L) 05/18/2015   S/P THR: total hip replacement (Bilateral) 05/18/2015   S/P TKR: total knee replacement (Bilateral) 05/18/2015   History of bladder cancer 05/18/2015   History of hip fracture 05/18/2015   History of pelvic fracture 05/18/2015   Long-term use of high-risk medication 05/18/2015   Chronic neck pain 05/18/2015   Chondrocalcinosis 05/18/2015   Chronic shoulder pain (Radicular) 05/18/2015   History of shoulder surgery 05/18/2015   Myofascial pain 05/18/2015   Chronic  musculoskeletal pain 05/18/2015   Muscle spasm 05/18/2015   Pure hypercholesterolemia 12/31/2014   Essential hypertension 12/31/2014   Muscle spasticity 07/30/2014   Involuntary muscle contractions 07/30/2014   Malignant neoplasm of bladder (HCC) 10/23/2013   Malignant neoplasm of overlapping sites of bladder (HCC) 10/23/2013   Lower urinary tract infectious disease 09/16/2013   Atrial fibrillation and flutter (HCC) 01/14/2013   Mixed hyperlipidemia 06/18/2009   Hypertension, benign 06/18/2009   Impaired fasting glucose 06/18/2009   PCP:  Rosemarie Ax, MD Pharmacy:   [facility] [phone], [place] - [address] AT [facility] OF [address] [place] [address] Phone: [phone] Fax: [phone]  [facility] #[id number] [place]ree Ditto, [place] - [address] AT [facility] OF [address] [place] [address] [place] [address] Phone: [phone] Fax: [phone]  [facility] [address], [place] - [address] [place] [address] Phone: [phone] Fax: [phone]     Social Determinants of Health (SDOH) Social History: SDOH Screenings  Food Insecurity: No Food Insecurity (03/03/2023)  Housing: Low Risk  (03/03/2023)  Transportation Needs: No Transportation Needs (03/03/2023)  Utilities: Not At Risk (03/03/2023)  Depression (PHQ2-9): Low Risk  (10/12/2022)  Financial Resource Strain: Low Risk  (11/03/2022)  Physical Activity: Sufficiently Active (02/16/2021)   Received from [facility], [facility]  Social Connections: Moderately Integrated (02/16/2021)   Received from [facility], [facility]  Stress: No Stress Concern Present (11/03/2022)  Tobacco Use: Medium Risk (03/02/2023)  Health Literacy: Low Risk  (05/02/2021)   Received from [facility], [facility]   SDOH Interventions:     Readmission Risk Interventions     No data to display

## 2023-03-03 NOTE — ED Notes (Signed)
Transport present to take patient to his inpatient room. Patient Alert and oriented. Belongs sent with patient.

## 2023-03-03 NOTE — Plan of Care (Signed)
Min to Mod A - follow hip precautions

## 2023-03-03 NOTE — Hospital Course (Signed)
HPI: Aaron Short is a 77 y.o. male with esophageal adenocarcinoma, chronic pain secondary to neoplasm presenting today for right hip injury.  Patient had a fall 5 days ago at home with right hip pain.  He had worsening pain over the week while walking and got an x-ray 2 days ago w/ oncology/palliative visit.  They called him today stating the x-ray showed evidence of a periprosthetic right hip fracture. Came to the ED for further evaluation. Reports difficulty w/ ambulation. No N/V, some dry coughing, no headache or vision change   Hospital course:  10/04: In ED, labs largely stable, Hypotensive appears chronic, CT hip per EDP reviewed by ortho - opt for nonsurgical management. Admitted to hospitalist service as pt nonambulatory - plan for PT/OT and HH vs SNF w/ TOC 10/05: pending delivery of equipment and arranging home health        Consultants:  Orthopedics - EDP spoke w/ Dr Allena Katz, per report from EDP he reviewed CT and this can be managed non-operatively   Procedures: none           ASSESSMENT & PLAN:   R hip periprosthetic fracture  Pain control - home dilaudid po + IV dilaudid prn Orthopedics follow up outpatient in 1 week  Hip abduction brace  PT/OT to follow   Esophageal Adenocarcinoma Not currently on chemotherapy d/t frailty  Pain control as above Outpatient f/u    Afib Appears in sinus at this time Maintain Xarelto No rate control meds d/t hypotension  Telemetry 12 hours and if no events may d/c    Hypotension, chronic Trial midodrine    Cough, dry, mild CXR    Macrocytic anemia Chronic but Hgb a bit lower here Likely chronic inflammation d/t cancer Monitor CBC If Hgb dropping / if bleeding would hold Xarelto    Severe Protein Calorie Malnutrition Regular diet Follow outpatient w/ dietician   Elevated INR 2.1 not on coumadin Question liver disease given recent abn LFT CMP follow outpatient          DVT prophylaxis:  Xarelto Pertinent IV fluids/nutrition: no continuous IV fluids, regular diet  Central lines / invasive devices: none   Code Status: DNR - pt does NOT want to receive chest compressions or intubation/mechanical ventilation  Family Communication: wife at bedside on rounds today    Disposition: observation TOC needs: home health, DME  Barriers to discharge / significant pending items: arrange home health and DME hopeful for d/c home tomorrow

## 2023-03-03 NOTE — ED Notes (Signed)
Patient is ready to transferred inpatient unit.

## 2023-03-03 NOTE — ED Notes (Signed)
Patient awake and had saturated bed upon entering room. Clean and dry sheets applied. Merplex placed on bottom. Noted dime size skin break at right side of tail bone. Tail bone redden. C/o pain rates 5. Dilaudid 1mg  IV given . Took po meds without problems. Alert and oriented

## 2023-03-03 NOTE — ED Notes (Signed)
Provider notified: "FYI- Patient here for fx of right hip. B/p is currently 79/47(58), hr 102, 94%"

## 2023-03-03 NOTE — Evaluation (Signed)
Occupational Therapy Evaluation Patient Details Name: Aaron Short MRN: [id number] DOB: 16-May-1946 Today's Date: 03/03/2023   History of Present Illness Aaron Short is a 77 y.o. male who had a fall at home approximately 5 days ago.  His pain continued to worsen over the this time until he got to the point where he can no longer ambulate.  He was seen by his primary care provider and radiographs were obtained, but not read until today.  Given his inability to ambulate and findings of periprosthetic greater trochanteric fracture, he came to the ED for further evaluation.  He has had bilateral hip arthroplasties and knee arthroplasties.  Right hip surgery was likely performed approximately 5 years ago.  He does take Xarelto for atrial fibrillation.  He also has a history of esophageal adenocarcinoma. Per Dr. Zettie Cooley consultation: Patient may weight-bear as tolerated on the right lower extremity.  Avoid all hip abduction.  Per Dr. Signa Kell instant message brace should be set at 0 degrees abduction and 0-90 degrees ext/flex  and worn at all times unless he is not tolerating it, at which time it can be removed while in bed as long as patient does not abduct his right hip (03/03/2023);   Clinical Impression   PT/OT co-evaluation completed due to complexity of patient presentation, for patient/clinician safety, and to ensure precautions are maintained. Patient  in bed in ED when first attempt eval - in bed with wife at bedside. She is a retired Engineer, civil (consulting) and contributed to history. Patient alert and follows commands.  Session interrupted several times for room transfers and equipment issues.  Patient lives  one story house and prior to fall was ambulating short distances with a RW and fell (fracturing his hip) when he attempted to ambulate without it. Aaron Short reports she attempted several times unsuccessfully to get home health PT- Since fall pt is dependent on  wife for ADL"s , pain  limited his ambulation severely.  During eval pt needed minA+2 for bed mobility and transfers, with one person helping to maintain precautions while the other supported trunk. Also transferred mod A +2 with RW. Demonstrates posterior lean and requires heavy cuing for hand placement and positioning during transfers. Patient was unable to take effective steps for walking due to difficulty with weight bearing on R LE due to pain. Patient's family is motivated to take him home and he will have +2 support at home with son who is strong helping. Pt had THR in past and knowledge on use of AE- info provided for wife where to get hip kit .  Patient would benefit from skilled OT  to address impairments and functional limitations in ADL's trng and AE retrg  as well as functional transfer and safety in ADL's  to return home safely with family and maximal functional independence.       If plan is discharge home, recommend the following:      Functional Status Assessment     Equipment Recommendations       Recommendations for Other Services       Precautions / Restrictions Precautions Precaution Comments: Per Dr. Zettie Cooley consultation (03/02/2023): Patient may weight-bear as tolerated on the right lower extremity. Avoid all hip abduction. Per Dr. Signa Kell instant message (03/03/2023): brace should be set at 0 degrees abduction and 0-90 degrees ext/flex and worn at all times unless he is not tolerating it, at which time it can be removed while in bed as long as patient does  not abduct his right hip. Restrictions Weight Bearing Restrictions: Yes RLE Weight Bearing: Weight bearing as tolerated Other Position/Activity Restrictions: Per Dr. Zettie Cooley consultation (03/02/2023): Patient may weight-bear as tolerated on the right lower extremity. Avoid all hip abduction. Per Dr. Signa Kell instant message (03/03/2023): brace should be set at 0 degrees abduction and 0-90 degrees ext/flex and worn at all times unless  he is not tolerating it, at which time it can be removed while in bed as long as patient does not abduct his right hip.      Mobility Bed Mobility Overal bed mobility: Needs Assistance Bed Mobility: Supine to Sit     Supine to sit: Min assist, +2 for physical assistance, +2 for safety/equipment     General bed mobility comments: patient required min A +2 to go supine to sit at edge of bed with PT assisting at R LE to maintain precautions and help move leg off edge of bed while OT supported trunk. He became lightheaded and required assistance to maintain sitting and prevent LOB backwards for several seconds until this cleared.    Transfers Overall transfer level: Needs assistance Equipment used: Rolling walker (2 wheels) Transfers: Sit to/from Stand Sit to Stand: From elevated surface, Min assist, Mod assist, +2 safety/equipment, +2 physical assistance           General transfer comment: Patient completed sit <> stand transfer at edge of bed and then bed to chair with minA +2, he then transfered chair to [facility] with mod A  +1, patient had difficulty taking small steps on R LE during transfer and had a backward lean and stooped posture. He required heavy cuing for hand placement. Bed adjusted to approximate height at home.      Balance   Sitting-balance support: No upper extremity supported, Feet supported Sitting balance-Leahy Scale: Fair   Postural control: Posterior lean Standing balance support: Bilateral upper extremity supported, During functional activity, Reliant on assistive device for balance Standing balance-Leahy Scale: Poor Standing balance comment: Patient needed intermittant support to maintain standing position at RW with some posterior lean neccesitating min-modA to prevent backwards fall when standing and transferring.                           ADL either performed or assessed with clinical judgement   ADL                                          General ADL Comments: Max A for LB dressing -and mod A for UB dressing - bathing max A - eating setup and grooming min A - toiletting hygiene Min A - clothing max A     Vision Patient Visual Report: No change from baseline       Perception         Praxis         Pertinent Vitals/Pain Pain Assessment Pain Score: 8      Extremity/Trunk Assessment Upper Extremity Assessment Upper Extremity Assessment: Overall WFL for tasks assessed;Right hand dominant   Lower Extremity Assessment Lower Extremity Assessment: Defer to PT evaluation RLE Deficits / Details: difficulty with weight bearing and AROM at R LE due to pain/weakness RLE: Unable to fully assess due to pain   Cervical / Trunk Assessment Cervical / Trunk Assessment: Kyphotic   Communication Communication Communication: No apparent difficulties;Hearing impairment (  hard of hearing) Cueing Techniques: Verbal cues;Gestural cues;Tactile cues;Visual cues   Cognition   Behavior During Therapy: [facility] for tasks assessed/performed                                         General Comments       Exercises     Shoulder Instructions      Home Living Family/patient expects to be discharged to:: Private residence Living Arrangements: Spouse/significant other;Other relatives (Son coming in for [facility]- sheriff staying as long as needed- wife retired Charity fundraiser) Available Help at Discharge: Family;Available 24 hours/day Type of Home: House Home Access: Stairs to enter [employer] of Steps: 4 (from garage or front) Entrance Stairs-Rails: Right;Left Home Layout: One level     Bathroom Shower/Tub: Producer, television/film/video: Handicapped height Bathroom Accessibility: Yes   Home Equipment: Agricultural consultant (2 wheels);Cane - single point;Shower seat;Wheelchair - manual   Additional Comments: w/c has small wheels, has tempurpedic bed that moves up and down at head and foot      Prior  Functioning/Environment Prior Level of Function : Needs assist             Mobility Comments: ambulates short distances with RW prior to fall last week (which happened when he tried to ambulate with AD). ADLs Comments: Patient assists with all aspects of care since the fall        OT Problem List: Decreased strength;Decreased activity tolerance;Decreased knowledge of use of DME or AE;Decreased safety awareness;Decreased knowledge of precautions;Pain;Impaired balance (sitting and/or standing)      OT Treatment/Interventions: Self-care/ADL training;Therapeutic exercise;Neuromuscular education;Patient/family education;DME and/or AE instruction;Therapeutic activities;Balance training    OT Goals(Current goals can be found in the care plan section) Acute Rehab OT Goals Patient Stated Goal: Go home with family OT Goal Formulation: With patient/family Time For Goal Achievement: 03/17/23 Potential to Achieve Goals: Good  OT Frequency: Min 2X/week    Co-evaluation   Reason for Co-Treatment: Complexity of the patient's impairments (multi-system involvement);To address functional/ADL transfers;For patient/therapist safety PT goals addressed during session: Mobility/safety with mobility;Balance;Proper use of DME;Strengthening/ROM OT goals addressed during session: ADL's and self-care;Proper use of Adaptive equipment and DME;Strengthening/ROM      AM-PAC OT "6 Clicks" Daily Activity     Outcome Measure Help from another person eating meals?: None Help from another person taking care of personal grooming?: A Little Help from another person toileting, which includes using toliet, bedpan, or urinal?: A Lot Help from another person bathing (including washing, rinsing, drying)?: A Lot Help from another person to put on and taking off regular upper body clothing?: A Lot Help from another person to put on and taking off regular lower body clothing?: A Lot 6 Click Score: 15   End of Session  Equipment Utilized During Treatment: Gait belt;Rolling walker (2 wheels)  Activity Tolerance: Patient limited by pain;Patient tolerated treatment well Patient left: in chair;with family/visitor present  OT Visit Diagnosis: Unsteadiness on feet (R26.81);History of falling (Z91.81);Muscle weakness (generalized) (M62.81);Dizziness and giddiness (R42)                Time: 4098-1191 OT Time Calculation (min): 42 min Charges:  OT General Charges $OT Visit: 1 Visit OT Treatments $Self Care/Home Management : 8-22 mins   Layani Foronda OTR/L,CLT 03/03/2023, 6:12 PM

## 2023-03-03 NOTE — Plan of Care (Signed)
?  Problem: Education: ?Goal: Knowledge of General Education information will improve ?Description: Including pain rating scale, medication(s)/side effects and non-pharmacologic comfort measures ?Outcome: Progressing ?  ?Problem: Health Behavior/Discharge Planning: ?Goal: Ability to manage health-related needs will improve ?Outcome: Progressing ?  ?Problem: Clinical Measurements: ?Goal: Ability to maintain clinical measurements within normal limits will improve ?Outcome: Progressing ?Goal: Will remain free from infection ?Outcome: Progressing ?Goal: Respiratory complications will improve ?Outcome: Progressing ?Goal: Cardiovascular complication will be avoided ?Outcome: Progressing ?  ?Problem: Activity: ?Goal: Risk for activity intolerance will decrease ?Outcome: Progressing ?  ?Problem: Coping: ?Goal: Level of anxiety will decrease ?Outcome: Progressing ?  ?Problem: Elimination: ?Goal: Will not experience complications related to bowel motility ?Outcome: Progressing ?Goal: Will not experience complications related to urinary retention ?Outcome: Progressing ?  ?Problem: Pain Managment: ?Goal: General experience of comfort will improve ?Outcome: Progressing ?  ?Problem: Safety: ?Goal: Ability to remain free from injury will improve ?Outcome: Progressing ?  ?Problem: Skin Integrity: ?Goal: Risk for impaired skin integrity will decrease ?Outcome: Progressing ?  ?

## 2023-03-03 NOTE — ED Notes (Signed)
Was informed from floor nurse patient was not going to be transferred to inpatient unit.

## 2023-03-04 DIAGNOSIS — S72001A Fracture of unspecified part of neck of right femur, initial encounter for closed fracture: Secondary | ICD-10-CM | POA: Diagnosis not present

## 2023-03-04 MED ORDER — MIDODRINE HCL 5 MG PO TABS: 5.0000 mg | ORAL_TABLET | Freq: Three times a day (TID) | ORAL | 0 refills | Status: AC

## 2023-03-04 MED ORDER — SENNOSIDES-DOCUSATE SODIUM 8.6-50 MG PO TABS: 1.0000 | ORAL_TABLET | Freq: Every evening | ORAL | Status: AC | PRN

## 2023-03-04 NOTE — Discharge Summary (Signed)
Physician Discharge Summary   Patient: Aaron Short MRN: [id number]  DOB: [birth year]   Admit:     Date of Admission: 03/02/2023 Admitted from: home   Discharge: Date of discharge: 03/04/23 Disposition: Home health - patient and family decline SNF rehab Condition at discharge: fair  CODE STATUS: DNR     Discharge Physician: Sunnie Nielsen, DO [facility]     PCP: Rosemarie Ax, MD  Recommendations for Outpatient Follow-up:  Follow up with PCP Rosemarie Ax, MD in 1-2 weeks Follow up with orthopedics in 1 week  Follow up with oncology as directed  Please obtain labs/tests: follow up imagine hip per ortho, CBC/BMP with oncology Please follow up on the following pending results: noen PCP AND OTHER OUTPATIENT PROVIDERS: SEE BELOW FOR SPECIFIC DISCHARGE INSTRUCTIONS PRINTED FOR PATIENT IN ADDITION TO GENERIC AVS PATIENT INFO    Discharge Instructions     Diet general   Complete by: As directed    Discharge wound care:   Complete by: As directed    Keep clean/dry, nonstick gauze wrap with breathable gauze/ACE wrap   Increase activity slowly   Complete by: As directed          Discharge Diagnoses: Principal Problem:   Closed right hip fracture [facility])       Hospital Course: HPI: Josehua Litalien is a 77 y.o. male with esophageal adenocarcinoma, chronic pain secondary to neoplasm presenting today for right hip injury.  Patient had a fall 5 days ago at home with right hip pain.  He had worsening pain over the week while walking and got an x-ray 2 days ago w/ oncology/palliative visit.  They called him today stating the x-ray showed evidence of a periprosthetic right hip fracture. Came to the ED for further evaluation. Reports difficulty w/ ambulation. No N/V, some dry coughing, no headache or vision change   Hospital course:  10/04: In ED, labs largely stable, Hypotensive appears chronic, CT hip per EDP reviewed by ortho -  opt for nonsurgical management. Admitted to hospitalist service as pt nonambulatory - plan for PT/OT and HH vs SNF w/ TOC 10/05: pending delivery of equipment and arranging home health  10/06: confirm home health and DME in place, he is qualifying for SNF rehab which was scisused w/ him and w/ his wife, both in the end opt for home w/ home health and have sufficient 24h supervision and assistance        Consultants:  Orthopedics    Procedures: none           ASSESSMENT & PLAN:   R hip periprosthetic fracture  Pain control - home dilaudid po Orthopedics follow up outpatient in 1 week  Hip abduction brace  PT/OT to follow w/ home health    Esophageal Adenocarcinoma Not currently on chemotherapy d/t frailty  Pain control as above Outpatient f/u    Afib Appears in sinus at this time Maintain Xarelto No rate control meds d/t hypotension  Telemetry 12 hours and if no events may d/c    Hypotension, chronic Trial midodrine which seems to be helping BP    Macrocytic anemia Chronic but Hgb a bit lower here Likely chronic inflammation d/t cancer Monitor CBC If Hgb dropping / if bleeding would hold Xarelto    Severe Protein Calorie Malnutrition Regular diet Follow outpatient w/ dietician   Elevated INR 2.1 not on coumadin Question liver disease given recent abn LFT CMP follow outpatient  Discharge Instructions  Allergies as of 03/04/2023       Reactions   Diltiazem Swelling   Leg edema Leg edema Leg edema Leg edema Leg edema Leg edema Leg edema Leg edema Leg edema   Penicillins Hives, Rash   Had hives as child Other reaction(s): RASH Childhood allergy Other reaction(s): RASH Other reaction(s): RASH Childhood allergy Other reaction(s): RASH Other reaction(s): RASH Childhood allergy   Tizanidine Other (See Comments)   Other reaction(s): OTHER  Pt states it puts him out Other reaction(s): OTHER  Pt states it puts him out Other  reaction(s): OTHER  Pt states it puts him out Other reaction(s): OTHER  Pt states it puts him out Other reaction(s): OTHER  Pt states it puts him out Other reaction(s): OTHER  Pt states it puts him out        Medication List     STOP taking these medications    prazosin 1 MG capsule Commonly known as: MINIPRESS       TAKE these medications    carisoprodol 350 MG tablet Commonly known as: SOMA Take 350 mg by mouth 2 (two) times daily.   dexamethasone 4 MG tablet Commonly known as: DECADRON Take 2 tablets (8 mg total) by mouth daily. Start the day after chemotherapy for 2 days. Take with food.   dronabinol 2.5 MG capsule Commonly known as: MARINOL Take 1 capsule (2.5 mg total) by mouth 2 (two) times daily before a meal.   HYDROmorphone 2 MG tablet Commonly known as: Dilaudid Take 2-3 tablets (4-6 mg total) by mouth every 4 (four) hours as needed for severe pain.   lidocaine-prilocaine cream Commonly known as: EMLA Apply to affected area once   loperamide 2 MG capsule Commonly known as: IMODIUM Take 1 capsule (2 mg total) by mouth See admin instructions. Initial: 4 mg,the 2 mg every 2 hours (4 mg every 4 hours at night)  maximum: 16 mg/day   LORazepam 0.5 MG tablet Commonly known as: ATIVAN Take 1 tablet (0.5 mg total) by mouth every 8 (eight) hours as needed (nausea).   magic mouthwash w/lidocaine Soln Take 5 mLs by mouth 4 (four) times daily as needed for mouth pain. Sig: Swish/Swallow 5-10 ml four times a day as needed. Dispense 480 ml. 1RF   Magnesium 500 MG Tabs Take 500 mg by mouth as needed.   Melatonin Maximum Strength 5 MG Tabs Generic drug: melatonin Take 5 mg by mouth at bedtime as needed.   midodrine 5 MG tablet Commonly known as: PROAMATINE Take 1 tablet (5 mg total) by mouth 3 (three) times daily with meals.   naloxone 4 MG/0.1ML Liqd nasal spray kit Commonly known as: Narcan Place 1 spray into the nose once for 1 dose. Spray half of bottle  content into each nostril, then call 911   nystatin 100000 UNIT/ML suspension Commonly known as: MYCOSTATIN Take 5 mLs (500,000 Units total) by mouth 4 (four) times daily.   omeprazole 10 MG capsule Commonly known as: PRILOSEC Take 20 mg by mouth daily.   ondansetron 8 MG tablet Commonly known as: Zofran Take 1 tablet (8 mg total) by mouth every 8 (eight) hours as needed for nausea or vomiting. Start on the third day after chemotherapy.   prochlorperazine 10 MG tablet Commonly known as: COMPAZINE Take 1 tablet (10 mg total) by mouth every 6 (six) hours as needed for nausea or vomiting.   rivaroxaban 20 MG Tabs tablet Commonly known as: XARELTO Take 20 mg by mouth daily.  senna-docusate 8.6-50 MG tablet Commonly known as: Senokot-S Take 1 tablet by mouth at bedtime as needed for mild constipation.   sertraline 50 MG tablet Commonly known as: ZOLOFT Take 100 mg by mouth at bedtime.   Vitamin B + C Complex Tabs Take 1 capsule by mouth every morning.               Discharge Care Instructions  (From admission, onward)           Start     Ordered   03/04/23 0000  Discharge wound care:       Comments: Keep clean/dry, nonstick gauze wrap with breathable gauze/ACE wrap   03/04/23 1141              Allergies  Allergen Reactions   Diltiazem Swelling    Leg edema Leg edema Leg edema Leg edema Leg edema Leg edema Leg edema Leg edema Leg edema   Penicillins Hives and Rash    Had hives as child Other reaction(s): RASH Childhood allergy Other reaction(s): RASH Other reaction(s): RASH Childhood allergy Other reaction(s): RASH Other reaction(s): RASH Childhood allergy   Tizanidine Other (See Comments)    Other reaction(s): OTHER  Pt states it puts him out Other reaction(s): OTHER  Pt states it puts him out Other reaction(s): OTHER  Pt states it puts him out Other reaction(s): OTHER  Pt states it puts him out Other reaction(s): OTHER  Pt states it  puts him out Other reaction(s): OTHER  Pt states it puts him out     Subjective: pt reports pain is controlled today, no concerns for discharge home   Discharge Exam: BP (!) 103/59   Pulse 90   Temp 98.3 F (36.8 C)   Resp 16   Ht 5\' 8"  (1.727 m)   Wt 55.8 kg   SpO2 97%   BMI 18.70 kg/m  General: Pt is alert, awake, not in acute distress, thin/frail Cardiovascular: RRR, S1/S2 +, no rubs, no gallops Respiratory: CTA bilaterally, no wheezing, no rhonchi Abdominal: Soft, NT, ND, bowel sounds + Extremities: no edema, no cyanosis     The results of significant diagnostics from this hospitalization (including imaging, microbiology, ancillary and laboratory) are listed below for reference.     Microbiology: No results found for this or any previous visit (from the past 240 hour(s)).   Labs: BNP (last 3 results) No results for input(s): "BNP" in the last 8760 hours. Basic Metabolic Panel: Recent Labs  Lab 02/28/23 0836 03/02/23 1413  NA 134* 134*  K 3.9 3.6  CL 94* 98  CO2 24 25  GLUCOSE 146* 159*  BUN 24* 26*  CREATININE 0.73 0.62  CALCIUM 9.1 8.5*   Liver Function Tests: Recent Labs  Lab 02/28/23 0836 03/02/23 1413  AST 57* 85*  ALT 24 23  ALKPHOS 144* 129*  BILITOT 0.5 0.5  PROT 6.4* 5.9*  ALBUMIN 2.9* 2.5*   No results for input(s): "LIPASE", "AMYLASE" in the last 168 hours. No results for input(s): "AMMONIA" in the last 168 hours. CBC: Recent Labs  Lab 02/28/23 0836 03/02/23 1413  WBC 10.7* 10.8*  NEUTROABS 7.8* 8.9*  HGB 9.5* 8.3*  HCT 28.2* 25.1*  MCV 105.2* 105.9*  PLT 333 299   Cardiac Enzymes: No results for input(s): "CKTOTAL", "CKMB", "CKMBINDEX", "TROPONINI" in the last 168 hours. BNP: Invalid input(s): "POCBNP" CBG: No results for input(s): "GLUCAP" in the last 168 hours. D-Dimer No results for input(s): "DDIMER" in the last 72 hours. Hgb A1c  No results for input(s): "HGBA1C" in the last 72 hours. Lipid Profile No results  for input(s): "CHOL", "HDL", "LDLCALC", "TRIG", "CHOLHDL", "LDLDIRECT" in the last 72 hours. Thyroid function studies No results for input(s): "TSH", "T4TOTAL", "T3FREE", "THYROIDAB" in the last 72 hours.  Invalid input(s): "FREET3" Anemia work up No results for input(s): "VITAMINB12", "FOLATE", "FERRITIN", "TIBC", "IRON", "RETICCTPCT" in the last 72 hours. Urinalysis    Component Value Date/Time   COLORURINE YELLOW (A) 01/11/2023 2335   APPEARANCEUR HAZY (A) 01/11/2023 2335   APPEARANCEUR CLOUDY 09/14/2013 1214   LABSPEC 1.010 01/11/2023 2335   LABSPEC 1.010 09/14/2013 1214   PHURINE 6.0 01/11/2023 2335   GLUCOSEU NEGATIVE 01/11/2023 2335   GLUCOSEU NEGATIVE 09/14/2013 1214   HGBUR NEGATIVE 01/11/2023 2335   BILIRUBINUR NEGATIVE 01/11/2023 2335   BILIRUBINUR NEGATIVE 09/14/2013 1214   KETONESUR NEGATIVE 01/11/2023 2335   PROTEINUR NEGATIVE 01/11/2023 2335   NITRITE NEGATIVE 01/11/2023 2335   LEUKOCYTESUR NEGATIVE 01/11/2023 2335   LEUKOCYTESUR 1+ 09/14/2013 1214   Sepsis Labs Recent Labs  Lab 02/28/23 0836 03/02/23 1413  WBC 10.7* 10.8*   Microbiology No results found for this or any previous visit (from the past 240 hour(s)). Imaging DG Chest Port 1 View  Result Date: 03/02/2023 CLINICAL DATA:  10031 Cough 10031 Cough, hx of with esophageal adenocarcinoma EXAM: PORTABLE CHEST 1 VIEW COMPARISON:  CT chest 01/31/2023, chest x-ray 02/11/2023 FINDINGS: Right chest wall Port-A-Cath with tip overlying superior cavoatrial junction. Interval increase in prominence of hilar regions. The heart and mediastinal contours are otherwise grossly unchanged. Atherosclerotic plaque. No focal consolidation. Chronic coarsened interstitial markings with no overt pulmonary edema. No pleural effusion. No pneumothorax. No acute osseous abnormality. IMPRESSION: 1. Interval increase in prominence of hilar regions. Underlying lymphadenopathy not excluded. 2. Underlying known interstitial lung  disease/pulmonary fibrosis. 3.  Aortic Atherosclerosis (ICD10-I70.0). Electronically Signed   By: Tish Frederickson M.D.   On: 03/02/2023 20:29   CT PELVIS WO CONTRAST  Result Date: 03/02/2023 CLINICAL DATA:  Periprosthetic hip fracture EXAM: CT PELVIS WITHOUT CONTRAST TECHNIQUE: Multidetector CT imaging of the pelvis was performed following the standard protocol without intravenous contrast. RADIATION DOSE REDUCTION: This exam was performed according to the departmental dose-optimization program which includes automated exposure control, adjustment of the mA and/or kV according to patient size and/or use of iterative reconstruction technique. COMPARISON:  Radiograph 02/28/2023, CT 01/31/2023, right hip radiograph 11/18/2015 FINDINGS: Urinary Tract: Partially obscured by artifact from hip hardware. Bladder nondistended Bowel:  No acute bowel wall thickening Vascular/Lymphatic: Moderate aortic atherosclerosis. No aneurysm. No suspicious lymph nodes. Reproductive: Limited by artifact. No obvious mass. Clips in the upper scrotum. Other:  Negative for pelvic effusion or free air. Musculoskeletal: Bilateral hip replacements with normal alignment but extensive artifact. Pubic symphysis and rami appear intact. SI joints are non widened. Acute appearing mildly displaced horizontally oriented fracture involves the greater trochanter of proximal right femur and extends to the proximal femoral rod. This is best seen on coronal reconstructions. Soft tissue edema and stranding lateral aspect of the right hip consistent with contusion and small hematoma. IMPRESSION: 1. Status post bilateral hip arthroplasties. Acute mildly displaced fracture involving the greater trochanter of proximal right femur with extension of fracture lucency to the proximal femoral rod. No dislocation. Overlying edema within the hip soft tissues and probable small soft tissue hematoma. Aortic Atherosclerosis (ICD10-I70.0). Electronically Signed   By: Jasmine Pang M.D.   On: 03/02/2023 17:29      Time coordinating discharge: over 30 minutes  SIGNED:  Sunnie Nielsen DO [facility]

## 2023-03-04 NOTE — TOC Progression Note (Signed)
Transition of Care [facility]) - Progression Note    Patient Details  Name: Aaron Short MRN: [id number] Date of Birth: Dec 09, 1945  Transition of Care [facility] CM/SW Contact  Susa Simmonds, [place] Phone Number: 03/04/2023, 11:55 AM  Clinical Narrative:   EMS has been arranged to take patient home. Wife has declined SNF recommendations.          Expected Discharge Plan and Services         Expected Discharge Date: 03/04/23                                     Social Determinants of Health (SDOH) Interventions SDOH Screenings   Food Insecurity: No Food Insecurity (03/03/2023)  Housing: Low Risk  (03/03/2023)  Transportation Needs: No Transportation Needs (03/03/2023)  Utilities: Not At Risk (03/03/2023)  Depression (PHQ2-9): Low Risk  (10/12/2022)  Financial Resource Strain: Low Risk  (11/03/2022)  Physical Activity: Sufficiently Active (02/16/2021)   Received from [facility], [facility]  Social Connections: Moderately Integrated (02/16/2021)   Received from [facility], [facility]  Stress: No Stress Concern Present (11/03/2022)  Tobacco Use: Medium Risk (03/02/2023)  Health Literacy: Low Risk  (05/02/2021)   [facility] [place] [facility]    Readmission Risk Interventions     No data to display

## 2023-03-04 NOTE — Progress Notes (Addendum)
Physical Therapy Treatment Patient Details Name: Aaron Short MRN: [id number] DOB: 1945-08-02 Today's Date: 03/04/2023   History of Present Illness Aaron Short is a 77 y.o. male who had a fall at home approximately 5 days ago.  His pain continued to worsen over the this time until he got to the point where he can no longer ambulate.  He was seen by his primary care provider and radiographs were obtained, but not read until today.  Given his inability to ambulate and findings of periprosthetic greater trochanteric fracture, he came to the ED for further evaluation.  He has had bilateral hip arthroplasties and knee arthroplasties.  Right hip surgery was likely performed approximately 5 years ago.  He does take Xarelto for atrial fibrillation.  He also has a history of esophageal adenocarcinoma. Per Dr. Zettie Cooley consultation: Patient may weight-bear as tolerated on the right lower extremity.  Avoid all hip abduction.  Per Dr. Signa Kell instant message brace should be set at 0 degrees abduction and 0-90 degrees ext/flex  and worn at all times unless he is not tolerating it, at which time it can be removed while in bed as long as patient does not abduct his right hip (03/03/2023);    PT Comments  Pt in bed, ready to get up.  He is assisted to EOB with min a x 2 and caution to avoid hip ABD and brace is applied in sitting with max a.  He needs min a x 1 for general sitting balance.  He is able to stand from EOB with mod a x 2.  He walks about 5' with generally unsteady gait with mod/max a x 1 to prevent falls and is limited by fatigue/balance and discomfort.  Needs max a to guide him safely to chair.  Seated AROM in sitting.  Breakfast has arrived and he is set up to eat with needs met.    Pt is a very high fall risk with gait and mobility that requires +2 assist at all times for safety.  Pt would have fallen several times without outside interventions.  He has not demonstrated the  ability to manage household ambulation distances.  Per chart and pt, his son and wife plan to assist at home.  Concern remains for fall risk.  Pt would benefit from SNF if it is available to him given level of assist needed for safe mobility at home to increase overall functional independence and decrease caregiver burden.  If pt and family choose to return home he will require +2 assist at all times.  Will recommend gait belt for mobility and one has been delivered to room but as yet no family is here to train.  He has a transport chair at home per PT eval but would benefit from a proper wheelchair for mobility and comfort.   He will benefit from all home health care services available to him.   Patient suffers from preiprosthetic fracture which impairs his/her ability to perform daily activities like toileting, feeding, dressing, grooming, bathing in the home. A cane, walker, crutch will not resolve the patient's issue with performing activities of daily living. A lightweight wheelchair and cushion is required/recommended and will allow patient to safely perform daily activities.   Patient can safely propel the wheelchair in the home or has a caregiver who can provide assistance.   Will reach out to MD and [facility] regarding concerns and adjust DC recommendations to reflect change to SNF but as always family is  free to chose as they feel fit.       If plan is discharge home, recommend the following: Direct supervision/assist for financial management;Help with stairs or ramp for entrance;Direct supervision/assist for medications management;Two people to help with walking and/or transfers;Two people to help with bathing/dressing/bathroom;Assistance with cooking/housework   Can travel by Garment/textile technologist  BSC/3in1;Hospital bed;Wheelchair (measurements PT);Wheelchair cushion (measurements PT)    Recommendations for Other Services       Precautions / Restrictions  Precautions Precautions: Other (comment) Precaution Comments: Per Dr. Zettie Cooley consultation (03/02/2023): Patient may weight-bear as tolerated on the right lower extremity. Avoid all hip abduction. Per Dr. Signa Kell instant message (03/03/2023): brace should be set at 0 degrees abduction and 0-90 degrees ext/flex and worn at all times unless he is not tolerating it, at which time it can be removed while in bed as long as patient does not abduct his right hip. Required Braces or Orthoses: Other Brace Other Brace: R hip abd brace Restrictions Weight Bearing Restrictions: Yes RLE Weight Bearing: Weight bearing as tolerated Other Position/Activity Restrictions: Per Dr. Zettie Cooley consultation (03/02/2023): Patient may weight-bear as tolerated on the right lower extremity. Avoid all hip abduction. Per Dr. Signa Kell instant message (03/03/2023): brace should be set at 0 degrees abduction and 0-90 degrees ext/flex and worn at all times unless he is not tolerating it, at which time it can be removed while in bed as long as patient does not abduct his right hip.     Mobility  Bed Mobility Overal bed mobility: Needs Assistance Bed Mobility: Supine to Sit     Supine to sit: Min assist, +2 for physical assistance, +2 for safety/equipment     General bed mobility comments: no dizziness noted today Patient Response: Cooperative  Transfers Overall transfer level: Needs assistance Equipment used: Rolling walker (2 wheels) Transfers: Sit to/from Stand Sit to Stand: From elevated surface, Min assist, Mod assist, +2 safety/equipment, +2 physical assistance                Ambulation/Gait Ambulation/Gait assistance: Mod assist Gait Distance (Feet): 5 Feet Assistive device: Rolling walker (2 wheels) Gait Pattern/deviations: Step-to pattern, Decreased step length - right, Decreased step length - left, Trunk flexed, Staggering right, Wide base of support Gait velocity: dec     General Gait  Details: was able to walk about 5 steps today very precarious with recliner follow.  generally unsafe and would have fallen without outside support and interventions   Stairs             Wheelchair Mobility     Tilt Bed Tilt Bed Patient Response: Cooperative  Modified Rankin (Stroke Patients Only)       Balance Overall balance assessment: Needs assistance, History of Falls Sitting-balance support: No upper extremity supported, Feet supported Sitting balance-Leahy Scale: Poor Sitting balance - Comments: generally unsteady and unsafe sitting without min assist. Postural control: Posterior lean Standing balance support: Bilateral upper extremity supported, During functional activity, Reliant on assistive device for balance Standing balance-Leahy Scale: Poor Standing balance comment: +1 mod ./max a with standing and steps.  very high fall risk and will require +2 for mobility                 Standardized Balance Assessment Standardized Balance Assessment : Berg Balance Test Berg Balance Test Sit to Stand: Needs moderate or maximal assist to stand Standing Unsupported: Unable to stand 30 seconds unassisted  Sitting with Back Unsupported but Feet Supported on Floor or Stool: Able to sit 30 seconds Stand to Sit: Needs assistance to sit Transfers: Needs two people to assist of supervise to be safe Standing Unsupported with Eyes Closed: Needs help to keep from falling Standing Ubsupported with Feet Together: Needs help to attain position and unable to hold for 15 seconds From Standing, Reach Forward with Outstretched Arm: Loses balance while trying/requires external support From Standing Position, Pick up Object from Floor: Unable to try/needs assist to keep balance From Standing Position, Turn to Look Behind Over each Shoulder: Needs assist to keep from losing balance and falling Turn 360 Degrees: Needs assistance while turning Standing Unsupported, Alternately Place Feet  on Step/Stool: Needs assistance to keep from falling or unable to try Standing Unsupported, One Foot in Front: Loses balance while stepping or standing Standing on One Leg: Unable to try or needs assist to prevent fall Total Score: 2        Cognition Arousal: Alert Behavior During Therapy: WFL for tasks assessed/performed Overall Cognitive Status: Within Functional Limits for tasks assessed                                          Exercises Other Exercises Other Exercises: seated AROM in chair    General Comments        Pertinent Vitals/Pain Pain Assessment Pain Assessment: Faces Faces Pain Scale: Hurts little more Pain Location: right hip with weight bearing or flexion Pain Descriptors / Indicators: Guarding, Grimacing Pain Intervention(s): Limited activity within patient's tolerance, Monitored during session, Repositioned    Home Living                          Prior Function            PT Goals (current goals can now be found in the care plan section) Progress towards PT goals: Progressing toward goals    Frequency    Min 1X/week      PT Plan      Co-evaluation              AM-PAC PT "6 Clicks" Mobility   Outcome Measure  Help needed turning from your back to your side while in a flat bed without using bedrails?: A Little Help needed moving from lying on your back to sitting on the side of a flat bed without using bedrails?: A Lot Help needed moving to and from a bed to a chair (including a wheelchair)?: Total Help needed standing up from a chair using your arms (e.g., wheelchair or bedside chair)?: A Lot Help needed to walk in hospital room?: Total Help needed climbing 3-5 steps with a railing? : Total 6 Click Score: 10    End of Session Equipment Utilized During Treatment: Gait belt;Other (comment) Activity Tolerance: Patient limited by fatigue;Patient tolerated treatment well Patient left: in chair;with call  bell/phone within reach;with chair alarm set;with nursing/sitter in room Nurse Communication: Mobility status PT Visit Diagnosis: Muscle weakness (generalized) (M62.81);History of falling (Z91.81);Difficulty in walking, not elsewhere classified (R26.2);Pain Pain - Right/Left: Right Pain - part of body: Hip     Time: 1610-9604 PT Time Calculation (min) (ACUTE ONLY): 10 min  Charges:    $Gait Training: 8-22 mins PT General Charges $$ ACUTE PT VISIT: 1 Visit  Danielle Dess, PTA 03/04/23, 10:09 AM

## 2023-03-04 NOTE — Plan of Care (Signed)
  Problem: Education: Goal: Knowledge of General Education information will improve Description: Including pain rating scale, medication(s)/side effects and non-pharmacologic comfort measures Outcome: Progressing   Problem: Health Behavior/Discharge Planning: Goal: Ability to manage health-related needs will improve Outcome: Progressing   Problem: Clinical Measurements: Goal: Respiratory complications will improve Outcome: Progressing   

## 2023-03-05 ENCOUNTER — Other Ambulatory Visit: Payer: Self-pay

## 2023-03-05 MED ORDER — HYDROMORPHONE HCL 2 MG PO TABS: 4.0000 mg | ORAL_TABLET | ORAL | 0 refills | Status: DC | PRN

## 2023-03-05 NOTE — Telephone Encounter (Signed)
Patient wife states she has not called the insurance company or the cardiologist but states she will call them today when she gets home and call us back

## 2023-03-06 NOTE — Telephone Encounter (Signed)
Patient wife left a message at 12:41pm stating that insurance would cover the procedure and they would schedule the EGD with Cryotherapy. Return patient call and left a message for call back

## 2023-03-07 ENCOUNTER — Ambulatory Visit: Payer: Medicare Other

## 2023-03-07 ENCOUNTER — Other Ambulatory Visit: Payer: Medicare Other

## 2023-03-07 ENCOUNTER — Ambulatory Visit: Payer: Medicare Other | Admitting: Oncology

## 2023-03-07 NOTE — Telephone Encounter (Signed)
Called patient wife [name] and she states that now the patient is having some doubts about the procedure. They are going to talk about the procedure later today and she will give me a call on what they decide about having the EGD done.

## 2023-03-08 NOTE — Telephone Encounter (Signed)
Called patient and left a message for call back  

## 2023-03-08 NOTE — Telephone Encounter (Signed)
Patient wife [name] called and states that her husband does not want to have the EGD with Cryotherapy done at this time

## 2023-03-09 ENCOUNTER — Inpatient Hospital Stay
Admission: EM | Admit: 2023-03-09 | Discharge: 2023-03-21 | DRG: 535 | Disposition: A | Discharge status: Hospice | Payer: Medicare Other | Attending: Internal Medicine | Admitting: Internal Medicine

## 2023-03-09 ENCOUNTER — Emergency Department: Payer: Medicare Other

## 2023-03-09 ENCOUNTER — Other Ambulatory Visit: Payer: Self-pay

## 2023-03-09 DIAGNOSIS — Y92000 Kitchen of unspecified non-institutional (private) residence as  the place of occurrence of the external cause: Secondary | ICD-10-CM

## 2023-03-09 DIAGNOSIS — S72002A Fracture of unspecified part of neck of left femur, initial encounter for closed fracture: Secondary | ICD-10-CM | POA: Diagnosis not present

## 2023-03-09 DIAGNOSIS — Z96643 Presence of artificial hip joint, bilateral: Secondary | ICD-10-CM | POA: Diagnosis present

## 2023-03-09 DIAGNOSIS — I10 Essential (primary) hypertension: Secondary | ICD-10-CM | POA: Diagnosis present

## 2023-03-09 DIAGNOSIS — Z681 Body mass index (BMI) 19 or less, adult: Secondary | ICD-10-CM

## 2023-03-09 DIAGNOSIS — Z8551 Personal history of malignant neoplasm of bladder: Secondary | ICD-10-CM | POA: Diagnosis not present

## 2023-03-09 DIAGNOSIS — R54 Age-related physical debility: Secondary | ICD-10-CM | POA: Diagnosis present

## 2023-03-09 DIAGNOSIS — L8990 Pressure ulcer of unspecified site, unspecified stage: Secondary | ICD-10-CM | POA: Diagnosis present

## 2023-03-09 DIAGNOSIS — Z79899 Other long term (current) drug therapy: Secondary | ICD-10-CM

## 2023-03-09 DIAGNOSIS — R110 Nausea: Secondary | ICD-10-CM | POA: Diagnosis present

## 2023-03-09 DIAGNOSIS — C159 Malignant neoplasm of esophagus, unspecified: Secondary | ICD-10-CM | POA: Diagnosis present

## 2023-03-09 DIAGNOSIS — Y92009 Unspecified place in unspecified non-institutional (private) residence as the place of occurrence of the external cause: Secondary | ICD-10-CM | POA: Diagnosis not present

## 2023-03-09 DIAGNOSIS — G894 Chronic pain syndrome: Secondary | ICD-10-CM | POA: Diagnosis present

## 2023-03-09 DIAGNOSIS — Z87891 Personal history of nicotine dependence: Secondary | ICD-10-CM

## 2023-03-09 DIAGNOSIS — E119 Type 2 diabetes mellitus without complications: Secondary | ICD-10-CM | POA: Diagnosis present

## 2023-03-09 DIAGNOSIS — R1310 Dysphagia, unspecified: Secondary | ICD-10-CM | POA: Diagnosis present

## 2023-03-09 DIAGNOSIS — L89621 Pressure ulcer of left heel, stage 1: Secondary | ICD-10-CM | POA: Diagnosis present

## 2023-03-09 DIAGNOSIS — W1830XA Fall on same level, unspecified, initial encounter: Secondary | ICD-10-CM | POA: Diagnosis present

## 2023-03-09 DIAGNOSIS — Z8249 Family history of ischemic heart disease and other diseases of the circulatory system: Secondary | ICD-10-CM | POA: Diagnosis not present

## 2023-03-09 DIAGNOSIS — E860 Dehydration: Secondary | ICD-10-CM | POA: Diagnosis present

## 2023-03-09 DIAGNOSIS — I4892 Unspecified atrial flutter: Secondary | ICD-10-CM | POA: Diagnosis present

## 2023-03-09 DIAGNOSIS — Z66 Do not resuscitate: Secondary | ICD-10-CM | POA: Diagnosis present

## 2023-03-09 DIAGNOSIS — R634 Abnormal weight loss: Secondary | ICD-10-CM | POA: Diagnosis present

## 2023-03-09 DIAGNOSIS — E785 Hyperlipidemia, unspecified: Secondary | ICD-10-CM | POA: Diagnosis present

## 2023-03-09 DIAGNOSIS — J690 Pneumonitis due to inhalation of food and vomit: Secondary | ICD-10-CM | POA: Diagnosis not present

## 2023-03-09 DIAGNOSIS — E43 Unspecified severe protein-calorie malnutrition: Secondary | ICD-10-CM | POA: Diagnosis present

## 2023-03-09 DIAGNOSIS — R296 Repeated falls: Secondary | ICD-10-CM | POA: Diagnosis present

## 2023-03-09 DIAGNOSIS — C679 Malignant neoplasm of bladder, unspecified: Secondary | ICD-10-CM | POA: Diagnosis present

## 2023-03-09 DIAGNOSIS — W19XXXA Unspecified fall, initial encounter: Principal | ICD-10-CM | POA: Diagnosis present

## 2023-03-09 DIAGNOSIS — M25551 Pain in right hip: Secondary | ICD-10-CM | POA: Diagnosis present

## 2023-03-09 DIAGNOSIS — I4891 Unspecified atrial fibrillation: Secondary | ICD-10-CM | POA: Diagnosis present

## 2023-03-09 DIAGNOSIS — Z96653 Presence of artificial knee joint, bilateral: Secondary | ICD-10-CM | POA: Diagnosis present

## 2023-03-09 DIAGNOSIS — Z515 Encounter for palliative care: Secondary | ICD-10-CM | POA: Diagnosis not present

## 2023-03-09 DIAGNOSIS — Z7901 Long term (current) use of anticoagulants: Secondary | ICD-10-CM | POA: Diagnosis not present

## 2023-03-09 DIAGNOSIS — M9701XA Periprosthetic fracture around internal prosthetic right hip joint, initial encounter: Secondary | ICD-10-CM

## 2023-03-09 DIAGNOSIS — S72115A Nondisplaced fracture of greater trochanter of left femur, initial encounter for closed fracture: Secondary | ICD-10-CM | POA: Diagnosis present

## 2023-03-09 DIAGNOSIS — Z888 Allergy status to other drugs, medicaments and biological substances status: Secondary | ICD-10-CM

## 2023-03-09 DIAGNOSIS — D649 Anemia, unspecified: Secondary | ICD-10-CM | POA: Diagnosis present

## 2023-03-09 DIAGNOSIS — I959 Hypotension, unspecified: Secondary | ICD-10-CM | POA: Diagnosis present

## 2023-03-09 DIAGNOSIS — Z1152 Encounter for screening for COVID-19: Secondary | ICD-10-CM | POA: Diagnosis not present

## 2023-03-09 DIAGNOSIS — T451X5A Adverse effect of antineoplastic and immunosuppressive drugs, initial encounter: Secondary | ICD-10-CM | POA: Diagnosis present

## 2023-03-09 DIAGNOSIS — Z880 Allergy status to penicillin: Secondary | ICD-10-CM

## 2023-03-09 DIAGNOSIS — G8929 Other chronic pain: Secondary | ICD-10-CM | POA: Diagnosis present

## 2023-03-09 DIAGNOSIS — L89153 Pressure ulcer of sacral region, stage 3: Secondary | ICD-10-CM | POA: Diagnosis present

## 2023-03-09 DIAGNOSIS — R627 Adult failure to thrive: Secondary | ICD-10-CM | POA: Diagnosis present

## 2023-03-09 DIAGNOSIS — G8928 Other chronic postprocedural pain: Secondary | ICD-10-CM | POA: Diagnosis present

## 2023-03-09 LAB — COMPREHENSIVE METABOLIC PANEL
ALT: 24 U/L (ref 0–44)
AST: 62 U/L — ABNORMAL HIGH (ref 15–41)
Albumin: 2.6 g/dL — ABNORMAL LOW (ref 3.5–5.0)
Alkaline Phosphatase: 177 U/L — ABNORMAL HIGH (ref 38–126)
Anion gap: 14 (ref 5–15)
BUN: 18 mg/dL (ref 8–23)
CO2: 26 mmol/L (ref 22–32)
Calcium: 8.7 mg/dL — ABNORMAL LOW (ref 8.9–10.3)
Chloride: 97 mmol/L — ABNORMAL LOW (ref 98–111)
Creatinine, Ser: 0.61 mg/dL (ref 0.61–1.24)
GFR, Estimated: >60 mL/min (ref >60)
Glucose, Bld: 135 mg/dL — ABNORMAL HIGH (ref 70–99)
Potassium: 3.8 mmol/L (ref 3.5–5.1)
Sodium: 137 mmol/L (ref 135–145)
Total Bilirubin: 0.7 mg/dL (ref 0.3–1.2)
Total Protein: 6 g/dL — ABNORMAL LOW (ref 6.5–8.1)

## 2023-03-09 LAB — CBC
HCT: 28.8 % — ABNORMAL LOW (ref 39.0–52.0)
Hemoglobin: 9.2 g/dL — ABNORMAL LOW (ref 13.0–17.0)
MCH: 34.5 pg — ABNORMAL HIGH (ref 26.0–34.0)
MCHC: 31.9 g/dL (ref 30.0–36.0)
MCV: 107.9 fL — ABNORMAL HIGH (ref 80.0–100.0)
Platelets: 337 10*3/uL (ref 150–400)
RBC: 2.67 MIL/uL — ABNORMAL LOW (ref 4.22–5.81)
RDW: 16.6 % — ABNORMAL HIGH (ref 11.5–15.5)
WBC: 11.8 10*3/uL — ABNORMAL HIGH (ref 4.0–10.5)
nRBC: 0 % (ref 0.0–0.2)

## 2023-03-09 LAB — TYPE AND SCREEN
ABO/RH(D): A POS
Antibody Screen: NEGATIVE

## 2023-03-09 LAB — TROPONIN I (HIGH SENSITIVITY)
Troponin I (High Sensitivity): 22 ng/L — ABNORMAL HIGH (ref <18)
Troponin I (High Sensitivity): 22 ng/L — ABNORMAL HIGH (ref <18)

## 2023-03-09 MED ORDER — SODIUM CHLORIDE 0.9 % IV BOLUS
500.0000 mL | Freq: Once | INTRAVENOUS | Status: AC
  Administered 2023-03-09: 500 mL via INTRAVENOUS

## 2023-03-09 MED ORDER — MORPHINE SULFATE (PF) 2 MG/ML IV SOLN
2.0000 mg | INTRAVENOUS | Status: DC | PRN
  Administered 2023-03-09: 2 mg via INTRAVENOUS
  Filled 2023-03-09: qty 1

## 2023-03-09 MED ORDER — METHOCARBAMOL 1000 MG/10ML IJ SOLN: 500.0000 mg | Freq: Four times a day (QID) | INTRAVENOUS | Status: DC | PRN

## 2023-03-09 MED ORDER — BISACODYL 5 MG PO TBEC
5.0000 mg | DELAYED_RELEASE_TABLET | Freq: Every day | ORAL | Status: DC | PRN
  Administered 2023-03-17: 5 mg via ORAL
  Filled 2023-03-09 (×3): qty 1

## 2023-03-09 MED ORDER — HYDROMORPHONE HCL 1 MG/ML IJ SOLN
0.5000 mg | Freq: Once | INTRAMUSCULAR | Status: AC
  Administered 2023-03-09: 0.5 mg via INTRAVENOUS
  Filled 2023-03-09: qty 0.5

## 2023-03-09 MED ORDER — LACTATED RINGERS IV SOLN: INTRAVENOUS | Status: AC

## 2023-03-09 MED ORDER — HYDROMORPHONE HCL 1 MG/ML IJ SOLN
0.5000 mg | INTRAMUSCULAR | Status: DC | PRN
  Administered 2023-03-10 – 2023-03-15 (×16): 0.5 mg via INTRAVENOUS
  Filled 2023-03-09 (×17): qty 0.5

## 2023-03-09 MED ORDER — HYDROCODONE-ACETAMINOPHEN 5-325 MG PO TABS: 1.0000 | ORAL_TABLET | Freq: Four times a day (QID) | ORAL | Status: DC | PRN

## 2023-03-09 MED ORDER — SODIUM CHLORIDE 0.9 % IV BOLUS
1000.0000 mL | Freq: Once | INTRAVENOUS | Status: AC
  Administered 2023-03-09: 1000 mL via INTRAVENOUS

## 2023-03-09 MED ORDER — METHOCARBAMOL 500 MG PO TABS
500.0000 mg | ORAL_TABLET | Freq: Four times a day (QID) | ORAL | Status: DC | PRN
  Administered 2023-03-10 – 2023-03-11 (×2): 500 mg via ORAL
  Filled 2023-03-09 (×2): qty 1

## 2023-03-09 MED ORDER — POLYETHYLENE GLYCOL 3350 17 G PO PACK: 17.0000 g | PACK | Freq: Every day | ORAL | Status: DC | PRN

## 2023-03-09 NOTE — ED Triage Notes (Signed)
Pt BIB EMS from home with c/o fall yesterday leading to L hip pain. Pt states he's been unable to weight bare since. Pt has dry mucus membranes and is tachycardic in triage-no meds/tx given PTA via EMS

## 2023-03-09 NOTE — Assessment & Plan Note (Signed)
Chart review shows patient has chronic anemia since June 2024 as far as I can go back.  With his history of low blood pressure and chronic anemia of course there is suspicion for GI loss. We can do stool occult and screen.  Type and screen.  IV PPI.

## 2023-03-09 NOTE — Assessment & Plan Note (Signed)
Vitals:   03/09/23 1210 03/09/23 1230 03/09/23 1400 03/09/23 1430  BP: 99/67 105/63 105/71 106/65   03/09/23 1530 03/09/23 1600 03/09/23 1800 03/09/23 1930  BP: (!) 94/39 (!) 99/56 99/68 (!) 101/54   03/09/23 2030  BP: (!) 102/55  Home meds consist of no antihypertensive agent.  Patient is on midodrine.

## 2023-03-09 NOTE — Assessment & Plan Note (Signed)
EKG changes A-fib with PVCs, low voltage, QTc of 395, nonspecific ST-T wave changes. Continue Xarelto.

## 2023-03-09 NOTE — Assessment & Plan Note (Signed)
Attribute to patient's low blood pressure.  Suspect combination of factors for patient's fall, including medications, deconditioning, recent right hip fracture. Currently fall precautions.  And fall prevention measures at home or consideration for SNF.

## 2023-03-09 NOTE — H&P (Signed)
History and Physical    Patient: Aaron Short ZOX:[id number] DOB: 1946-05-03 DOA: 03/09/2023 DOS: the patient was seen and examined on 03/09/2023 PCP: Rosemarie Ax, MD  Patient coming from: Home   Chief Complaint:  Chief Complaint  Patient presents with   Fall   HPI: Aaron Short is a 77 y.o. male with medical history significant for esophageal adenocarcinoma, chronic pain, recent right periprosthetic hip fracture, diabetes mellitus type 2, atrial fibrillation on Xarelto, hypertension, patient brought by EMS with complaints of fall yesterday leading to left hip pain which prompted patient in today patient was just discharged on the sixth after right fracture.  Per EDMD patient fell at home with a left periprosthetic fracture around his right and now left hip.  Patient was in the kitchen and turned around felt dizzy and fell landing on his hip on the left side..  Patient reports pain is unable to bear weight. No reports of chest pain palpitation dizziness or trauma to the head. In ED vitals show tachycardia with a heart rate of 120 blood pressure 99/67 O2 sats of 98% on room air. Labs notable for normal electrolytes, except for glucose of 135 normal kidney function, alk phos elevated at 177 albumin 2.6 AST of 62 which I suspect may be due to mild rhabdo from his fall we will check a CPK and add on, normal ALT, troponin of 22 x 2. CBC shows leukocytosis of 11.8 hemoglobin of 9.2 which is chronic, MCV 107.9 RDW of 16.6 and normal platelets.  Review of Systems: Review of Systems  Musculoskeletal:  Positive for joint pain.    Past Medical History:  Diagnosis Date   Arthralgia of lower leg 04/02/2012   Arthralgia of upper arm 06/18/2009   Cancer (HCC)    Diabetes mellitus without complication (HCC)    FH: atrial fibrillation    Hypertension    Past Surgical History:  Procedure Laterality Date   bladder cancer surgery  2015   ESOPHAGOGASTRODUODENOSCOPY (EGD)  WITH PROPOFOL N/A 10/30/2022   Procedure: ESOPHAGOGASTRODUODENOSCOPY (EGD) WITH PROPOFOL;  Surgeon: Toney Reil, MD;  Location: [facility] ENDOSCOPY;  Service: Gastroenterology;  Laterality: N/A;   IR IMAGING GUIDED PORT INSERTION  11/13/2022   JOINT REPLACEMENT     REPLACEMENT TOTAL HIP W/  RESURFACING IMPLANTS Bilateral    SHOULDER SURGERY Bilateral    TOTAL KNEE ARTHROPLASTY Bilateral    Social History:   reports that he has quit smoking. His smoking use included cigarettes. He has never used smokeless tobacco. He reports that he does not drink alcohol and does not use drugs.  Allergies  Allergen Reactions   Diltiazem Swelling    Leg edema Leg edema Leg edema Leg edema Leg edema Leg edema Leg edema Leg edema Leg edema   Penicillins Hives and Rash    Had hives as child Other reaction(s): RASH Childhood allergy Other reaction(s): RASH Other reaction(s): RASH Childhood allergy Other reaction(s): RASH Other reaction(s): RASH Childhood allergy   Tizanidine Other (See Comments)    Other reaction(s): OTHER  Pt states it puts him out Other reaction(s): OTHER  Pt states it puts him out Other reaction(s): OTHER  Pt states it puts him out Other reaction(s): OTHER  Pt states it puts him out Other reaction(s): OTHER  Pt states it puts him out Other reaction(s): OTHER  Pt states it puts him out    Family History  Problem Relation Age of Onset   Dementia Mother    Heart disease Father  Prior to Admission medications   Medication Sig Start Date End Date Taking? Authorizing Provider  B Complex-C (VITAMIN B + C COMPLEX) TABS Take 1 capsule by mouth every morning. Patient not taking: Reported on 02/21/2023    [provider]  carisoprodol (SOMA) 350 MG tablet Take 350 mg by mouth 2 (two) times daily.     [provider]  dexamethasone (DECADRON) 4 MG tablet Take 2 tablets (8 mg total) by mouth daily. Start the day after chemotherapy for 2 days. Take with  food. Patient not taking: Reported on 02/21/2023 11/03/22   Rickard Patience, MD  dronabinol (MARINOL) 2.5 MG capsule Take 1 capsule (2.5 mg total) by mouth 2 (two) times daily before a meal. 02/15/23   Borders, Daryl Eastern, NP  HYDROmorphone (DILAUDID) 2 MG tablet Take 2-3 tablets (4-6 mg total) by mouth every 4 (four) hours as needed for severe pain. 03/05/23   Borders, Daryl Eastern, NP  lidocaine-prilocaine (EMLA) cream Apply to affected area once 11/03/22   Rickard Patience, MD  loperamide (IMODIUM) 2 MG capsule Take 1 capsule (2 mg total) by mouth See admin instructions. Initial: 4 mg,the 2 mg every 2 hours (4 mg every 4 hours at night)  maximum: 16 mg/day 11/22/22   Rickard Patience, MD  LORazepam (ATIVAN) 0.5 MG tablet Take 1 tablet (0.5 mg total) by mouth every 8 (eight) hours as needed (nausea). 02/15/23   Borders, Daryl Eastern, NP  magic mouthwash w/lidocaine SOLN Take 5 mLs by mouth 4 (four) times daily as needed for mouth pain. Sig: Swish/Swallow 5-10 ml four times a day as needed. Dispense 480 ml. 1RF 01/10/23   Rickard Patience, MD  Magnesium 500 MG TABS Take 500 mg by mouth as needed. Patient not taking: Reported on 02/21/2023    [provider]  melatonin (MELATONIN MAXIMUM STRENGTH) 5 MG TABS Take 5 mg by mouth at bedtime as needed.    [provider]  midodrine (PROAMATINE) 5 MG tablet Take 1 tablet (5 mg total) by mouth 3 (three) times daily with meals. 03/04/23   Sunnie Nielsen, DO  naloxone [facility]) nasal spray 4 mg/0.1 mL Place 1 spray into the nose once for 1 dose. Spray half of bottle content into each nostril, then call 911 11/15/22 03/03/23  Borders, Daryl Eastern, NP  nystatin (MYCOSTATIN) 100000 UNIT/ML suspension Take 5 mLs (500,000 Units total) by mouth 4 (four) times daily. 02/07/23   Rickard Patience, MD  omeprazole (PRILOSEC) 10 MG capsule Take 20 mg by mouth daily.    [provider]  ondansetron (ZOFRAN) 8 MG tablet Take 1 tablet (8 mg total) by mouth every 8 (eight) hours as needed for nausea or vomiting.  Start on the third day after chemotherapy. 12/27/22   Rickard Patience, MD  prochlorperazine (COMPAZINE) 10 MG tablet Take 1 tablet (10 mg total) by mouth every 6 (six) hours as needed for nausea or vomiting. 12/27/22   Rickard Patience, MD  rivaroxaban (XARELTO) 20 MG TABS tablet Take 20 mg by mouth daily. 03/26/17   [provider]  senna-docusate (SENOKOT-S) 8.6-50 MG tablet Take 1 tablet by mouth at bedtime as needed for mild constipation. 03/04/23   Sunnie Nielsen, DO  sertraline (ZOLOFT) 50 MG tablet Take 100 mg by mouth at bedtime. 10/05/22 03/03/23  [provider]     Vitals:   03/09/23 1800 03/09/23 1930 03/09/23 2030 03/09/23 2032  BP: 99/68 (!) 101/54 (!) 102/55   Pulse: 89 86 86   Resp:  18   Temp:    97.8 F (36.6 C)  TempSrc:    Oral  SpO2: 99% 97% 97%    Physical Exam Vitals and nursing note reviewed.  Constitutional:      General: He is not in acute distress. HENT:     Head: Normocephalic and atraumatic.     Right Ear: Hearing normal.     Left Ear: Hearing normal.     Nose: Nose normal. No nasal deformity.     Mouth/Throat:     Lips: Pink.     Tongue: No lesions.     Pharynx: Oropharynx is clear.  Eyes:     General: Lids are normal.     Extraocular Movements: Extraocular movements intact.  Cardiovascular:     Rate and Rhythm: Normal rate. Rhythm irregular.     Heart sounds: Normal heart sounds.  Pulmonary:     Effort: Pulmonary effort is normal.     Breath sounds: Normal breath sounds.  Abdominal:     General: Bowel sounds are normal. There is no distension.     Palpations: Abdomen is soft. There is no mass.     Tenderness: There is no abdominal tenderness.  Musculoskeletal:        General: Tenderness present.     Right lower leg: No edema.     Left lower leg: No edema.  Skin:    General: Skin is warm.  Neurological:     General: No focal deficit present.     Mental Status: He is alert and oriented to person, place, and time.     Cranial Nerves:  Cranial nerves 2-12 are intact.  Psychiatric:        Attention and Perception: Attention normal.        Mood and Affect: Mood normal.        Speech: Speech normal.        Behavior: Behavior normal. Behavior is cooperative.      Labs on Admission: I have personally reviewed following labs and imaging studies  CBC: Recent Labs  Lab 03/09/23 1220  WBC 11.8*  HGB 9.2*  HCT 28.8*  MCV 107.9*  PLT 337   Basic Metabolic Panel: Recent Labs  Lab 03/09/23 1220  NA 137  K 3.8  CL 97*  CO2 26  GLUCOSE 135*  BUN 18  CREATININE 0.61  CALCIUM 8.7*   GFR: Estimated Creatinine Clearance: 59.1 mL/min (by C-G formula based on SCr of 0.61 mg/dL). Liver Function Tests: Recent Labs  Lab 03/09/23 1220  AST 62*  ALT 24  ALKPHOS 177*  BILITOT 0.7  PROT 6.0*  ALBUMIN 2.6*   No results for input(s): "LIPASE", "AMYLASE" in the last 168 hours. No results for input(s): "AMMONIA" in the last 168 hours. Coagulation Profile: No results for input(s): "INR", "PROTIME" in the last 168 hours. Cardiac Enzymes: No results for input(s): "CKTOTAL", "CKMB", "CKMBINDEX", "TROPONINI" in the last 168 hours. BNP (last 3 results) No results for input(s): "PROBNP" in the last 8760 hours. HbA1C: No results for input(s): "HGBA1C" in the last 72 hours. CBG: No results for input(s): "GLUCAP" in the last 168 hours. Lipid Profile: No results for input(s): "CHOL", "HDL", "LDLCALC", "TRIG", "CHOLHDL", "LDLDIRECT" in the last 72 hours. Thyroid Function Tests: No results for input(s): "TSH", "T4TOTAL", "FREET4", "T3FREE", "THYROIDAB" in the last 72 hours. Anemia Panel: No results for input(s): "VITAMINB12", "FOLATE", "FERRITIN", "TIBC", "IRON", "RETICCTPCT" in the last 72 hours. Urinalysis    Component Value Date/Time   COLORURINE YELLOW (  A) 01/11/2023 2335   APPEARANCEUR HAZY (A) 01/11/2023 2335   APPEARANCEUR CLOUDY 09/14/2013 1214   LABSPEC 1.010 01/11/2023 2335   LABSPEC 1.010 09/14/2013 1214    PHURINE 6.0 01/11/2023 2335   GLUCOSEU NEGATIVE 01/11/2023 2335   GLUCOSEU NEGATIVE 09/14/2013 1214   HGBUR NEGATIVE 01/11/2023 2335   BILIRUBINUR NEGATIVE 01/11/2023 2335   BILIRUBINUR NEGATIVE 09/14/2013 1214   KETONESUR NEGATIVE 01/11/2023 2335   PROTEINUR NEGATIVE 01/11/2023 2335   NITRITE NEGATIVE 01/11/2023 2335   LEUKOCYTESUR NEGATIVE 01/11/2023 2335   LEUKOCYTESUR 1+ 09/14/2013 1214    Unresulted Labs (From admission, onward)     Start     Ordered   03/10/23 0500  Basic metabolic panel  Tomorrow morning,   R        03/09/23 1908   03/10/23 0500  CBC  Tomorrow morning,   R        03/09/23 1908            Medications  HYDROcodone-acetaminophen (NORCO/VICODIN) 5-325 MG per tablet 1-2 tablet (has no administration in time range)  morphine (PF) 2 MG/ML injection 2 mg (2 mg Intravenous Given 03/09/23 2003)  methocarbamol (ROBAXIN) tablet 500 mg (has no administration in time range)    Or  methocarbamol (ROBAXIN) 500 mg in dextrose 5 % 50 mL IVPB (has no administration in time range)  polyethylene glycol (MIRALAX / GLYCOLAX) packet 17 g (has no administration in time range)  bisacodyl (DULCOLAX) EC tablet 5 mg (has no administration in time range)  lactated ringers infusion ( Intravenous New Bag/Given 03/09/23 1959)  HYDROmorphone (DILAUDID) injection 0.5 mg (0.5 mg Intravenous Given 03/09/23 1243)  sodium chloride 0.9 % bolus 500 mL (0 mLs Intravenous Stopped 03/09/23 1340)  sodium chloride 0.9 % bolus 1,000 mL (0 mLs Intravenous Stopped 03/09/23 1629)  HYDROmorphone (DILAUDID) injection 0.5 mg (0.5 mg Intravenous Given 03/09/23 1455)  HYDROmorphone (DILAUDID) injection 0.5 mg (0.5 mg Intravenous Given 03/09/23 1807)    Radiological Exams on Admission: CT PELVIS WO CONTRAST  Result Date: 03/09/2023 CLINICAL DATA:  Hip trauma EXAM: CT PELVIS WITHOUT CONTRAST TECHNIQUE: Multidetector CT imaging of the pelvis was performed following the standard protocol without  intravenous contrast. RADIATION DOSE REDUCTION: This exam was performed according to the departmental dose-optimization program which includes automated exposure control, adjustment of the mA and/or kV according to patient size and/or use of iterative reconstruction technique. COMPARISON:  Left hip x-ray 03/09/2023. CT of the pelvis 03/02/2023. FINDINGS: Urinary Tract:  No abnormality visualized. Bowel:  Unremarkable visualized pelvic bowel loops. Vascular/Lymphatic: There are atherosclerotic calcifications of the iliac arteries and aorta. No enlarged lymph nodes are seen. Reproductive: There surgical clips in the bilateral inguinal canals. Prostate gland is not well seen. Other: There is no ascites. There is diffuse body wall edema, unchanged. There is no focal hematoma. Musculoskeletal: Bilateral hip arthroplasties are again seen. There is new acute comminuted fracture involving the left greater trochanter which appears nondisplaced. This extends to the femoral prosthesis. There is no dislocation. Subacute right greater trochanteric fracture appears unchanged. IMPRESSION: 1. Acute comminuted nondisplaced fracture of the left greater trochanter extending to the femoral prosthesis. 2. Stable subacute right greater trochanteric fracture. 3. Stable body wall edema. 4. Bilateral hip arthroplasties appear in anatomic alignment. Aortic Atherosclerosis (ICD10-I70.0). Electronically Signed   By: Darliss Cheney M.D.   On: 03/09/2023 17:41   DG Thoracic Spine 2 View  Result Date: 03/09/2023 CLINICAL DATA:  Post fall, now with back pain. History of compression fractures. EXAM:  THORACIC SPINE 2 VIEWS COMPARISON:  CT of the chest, abdomen and pelvis-01/31/2023 FINDINGS: Evaluation of the superior aspect of the thoracic spine as well as the cervicothoracic junction is degraded secondary to overlying osseous and soft tissue structures Mild scoliotic curvature of the thoracolumbar spine with dominant mid component convex to  the left. Thoracic vertebral body heights are grossly preserved given obliquity. Stigmata of dish throughout the mid and caudal aspects of the thoracic spine. Limited visualization of the adjacent thorax demonstrates port a catheter tip projects over the superior cavoatrial junction. Atherosclerotic plaque within the thoracic aorta. IMPRESSION: 1. No acute findings given scoliotic curvature of the thoracolumbar spine. 2. Stigmata of DISH throughout the mid and caudal aspects of the thoracic spine. Electronically Signed   By: Simonne Come M.D.   On: 03/09/2023 15:12   DG Lumbar Spine Complete  Result Date: 03/09/2023 CLINICAL DATA:  Post fall.  History of compression fracture EXAM: LUMBAR SPINE - COMPLETE 4+ VIEW COMPARISON:  CT abdomen pelvis-01/31/2023 FINDINGS: There are 5 non rib-bearing lumbar type vertebral bodies. Mild scoliotic curvature of the thoracolumbar spine with caudal component convex to the left measuring 18 degrees (as measured from the superior endplate of T10 to the inferior endplate of L3). Straightening of the expected lumbar lordosis. No anterolisthesis or retrolisthesis. Mild (approximate 25%) compression deformities involving the T12, L2 and L3 vertebral bodies, unchanged. Remaining lumbar vertebral body heights appear preserved. Mild-to-moderate multilevel lumbar spine DDD, worse at L1-L2 and L2-L3 with disc space height loss, endplate irregularity and sclerosis. Limited visualization of the bilateral SI joints is normal. Atherosclerotic plaque within the abdominal aorta. Regional bowel gas pattern and soft tissues appear normal. IMPRESSION: 1. No definite acute findings. 2. Mild (approximate 25%) compression deformities involving the T12, L2 and L3 vertebral bodies, unchanged compared to abdominal CT performed 01/2023. 3. Mild-to-moderate multilevel lumbar spine DDD, worse at L1-L2 and L2-L3. Electronically Signed   By: Simonne Come M.D.   On: 03/09/2023 15:09   DG Hip Unilat With  Pelvis 2-3 Views Left  Result Date: 03/09/2023 CLINICAL DATA:  Post fall now with inability to weightbear. EXAM: DG HIP (WITH OR WITHOUT PELVIS) 2-3V LEFT COMPARISON:  Right hip radiographs-02/28/2023; pelvic CT-03/02/2023 FINDINGS: Sequela of previous bilateral total hip replacements. Interval slight displacement known fracture involving the right greater trochanter with extension to abut the femoral stem component of the right total hip prosthesis. Seen only on the provided lateral radiograph is an apparent fracture involving the anterior proximal aspect of the left femur, adjacent to the femoral stem component of the prosthesis, not definitely seen on pelvic CT performed 03/02/2023. IMPRESSION: 1. Seen only on the provided lateral radiograph is a potential minimally displaced fracture involving the anterior aspect of the anterior proximal aspect of the left femur, adjacent to the femoral stem component of the prosthesis, not definitively seen on pelvic CT performed 03/02/2023. 2. Interval slight displacement of known fracture involving the right greater trochanter with extension to abut the femoral stem component of the right total hip prosthesis. Electronically Signed   By: Simonne Come M.D.   On: 03/09/2023 15:06     Data Reviewed: Relevant notes from primary care and specialist visits, past discharge summaries as available in EHR, including Care Everywhere. Prior diagnostic testing as pertinent to current admission diagnoses Updated medications and problem lists for reconciliation ED course, including vitals, labs, imaging, treatment and response to treatment Triage notes, nursing and pharmacy notes and ED provider's notes Notable results as noted in  HPI  Assessment & Plan Fall at home, initial encounter Attribute to patient's low blood pressure.  Suspect combination of factors for patient's fall, including medications, deconditioning, recent right hip fracture. Currently fall precautions.  And  fall prevention measures at home or consideration for SNF. Closed left hip fracture, initial encounter [facility]) Orthopedics has been extremely supportive and efficient and patient was seen in the emergency room, with plans for nonoperative management. Pain control.  PT plan per ortho.   Atrial fibrillation and flutter (HCC) EKG changes A-fib with PVCs, low voltage, QTc of 395, nonspecific ST-T wave changes. Continue Xarelto.  Hypertension, benign Vitals:   03/09/23 1210 03/09/23 1230 03/09/23 1400 03/09/23 1430  BP: 99/67 105/63 105/71 106/65   03/09/23 1530 03/09/23 1600 03/09/23 1800 03/09/23 1930  BP: (!) 94/39 (!) 99/56 99/68 (!) 101/54   03/09/23 2030  BP: (!) 102/55  Home meds consist of no antihypertensive agent.  Patient is on midodrine.  Chronic anemia Chart review shows patient has chronic anemia since June 2024 as far as I can go back.  With his history of low blood pressure and chronic anemia of course there is suspicion for GI loss. We can do stool occult and screen.  Type and screen.  IV PPI.  Prognosis: Fair   DVT prophylaxis:  Xarelto   Consults:  Orthopedic: Dr.Crawley   Advance Care Planning:    Code Status: Full Code   Family Communication:  None   Disposition Plan:  SNF  Severity of Illness: The appropriate patient status for this patient is INPATIENT. Inpatient status is judged to be reasonable and necessary in order to provide the required intensity of service to ensure the patient's safety. The patient's presenting symptoms, physical exam findings, and initial radiographic and laboratory data in the context of their chronic comorbidities is felt to place them at high risk for further clinical deterioration. Furthermore, it is not anticipated that the patient will be medically stable for discharge from the hospital within 2 midnights of admission.   * I certify that at the point of admission it is my clinical judgment that the patient will require  inpatient hospital care spanning beyond 2 midnights from the point of admission due to high intensity of service, high risk for further deterioration and high frequency of surveillance required.*  Author: Gertha Calkin, MD 03/09/2023 9:01 PM  For on call review [email or web address].

## 2023-03-09 NOTE — ED Provider Notes (Signed)
[facility] Provider Note    Event Date/Time   First MD Initiated Contact with Patient 03/09/23 1202     (approximate)   History   Fall   HPI  Aaron Short is a 77 y.o. male past medical history significant for esophageal adenocarcinoma, chronic pain, atrial fibrillation, recent right periprosthetic hip fracture, who presents to the emergency department with left hip pain following a fall.  Patient had a witnessed fall yesterday whenever he got up to walk, fell hitting his left hip.  Complaining of pain to his left hip.  No head injury or loss of consciousness.  On anticoagulation for his atrial fibrillation.  Has a recent hospitalization and was evaluated by orthopedic surgery by Dr. Allena Katz, ultimately deemed not a good surgical candidate and it was treated nonoperatively.     Physical Exam   Triage Vital Signs: ED Triage Vitals  Encounter Vitals Group     BP 03/09/23 1210 99/67     Systolic BP Percentile --      Diastolic BP Percentile --      Pulse Rate 03/09/23 1210 (!) 120     Resp 03/09/23 1210 18     Temp 03/09/23 1210 97.9 F (36.6 C)     Temp src --      SpO2 03/09/23 1230 99 %     Weight --      Height --      Head Circumference --      Peak Flow --      Pain Score 03/09/23 1211 10     Pain Loc --      Pain Education --      Exclude from Growth Chart --     Most recent vital signs: Vitals:   03/09/23 1800 03/09/23 1930  BP: 99/68 (!) 101/54  Pulse: 89 86  Resp:    Temp:    SpO2: 99% 97%    Physical Exam Constitutional:      Appearance: He is well-developed.  HENT:     Head: Atraumatic.  Eyes:     Conjunctiva/sclera: Conjunctivae normal.  Cardiovascular:     Rate and Rhythm: Tachycardia present. Rhythm irregular.  Pulmonary:     Effort: No respiratory distress.  Abdominal:     Tenderness: There is no abdominal tenderness.  Musculoskeletal:     Cervical back: Normal range of motion.     Comments:  Tenderness to palpation to the left hip.  Skin:    General: Skin is warm.     Capillary Refill: Capillary refill takes less than 2 seconds.  Neurological:     Mental Status: He is alert. Mental status is at baseline.     IMPRESSION / MDM / ASSESSMENT AND PLAN / ED COURSE  I reviewed the triage vital signs and the nursing notes.  Differential diagnosis including fracture, dislocation, musculoskeletal strain, dehydration, atrial fibrillation with a rapid rate  EKG  I, Corena Herter, the attending physician, personally viewed and interpreted this ECG.   Rate: 98  Rhythm: Atrial fibrillation  Axis: Normal  Intervals: Normal  ST&T Change: None  Atrial fibrillation while on cardiac telemetry.  RADIOLOGY I independently reviewed imaging, my interpretation of imaging: X-ray of the left hip with concern for periprosthetic fracture.  CT scan done of the pelvis with concern for periprosthetic fracture to the left hip.  Read as acute comminuted fracture to the left greater trochanter extending to the femoral prosthesis.  Subacute right greater trochanteric fracture which  has been known to the patient.  Stable body wall edema.  LABS (all labs ordered are listed, but only abnormal results are displayed) Labs interpreted as -    Labs Reviewed  CBC - Abnormal; Notable for the following components:      Result Value   WBC 11.8 (*)    RBC 2.67 (*)    Hemoglobin 9.2 (*)    HCT 28.8 (*)    MCV 107.9 (*)    MCH 34.5 (*)    RDW 16.6 (*)    All other components within normal limits  COMPREHENSIVE METABOLIC PANEL - Abnormal; Notable for the following components:   Chloride 97 (*)    Glucose, Bld 135 (*)    Calcium 8.7 (*)    Total Protein 6.0 (*)    Albumin 2.6 (*)    AST 62 (*)    Alkaline Phosphatase 177 (*)    All other components within normal limits  TROPONIN I (HIGH SENSITIVITY) - Abnormal; Notable for the following components:   Troponin I (High Sensitivity) 22 (*)    All  other components within normal limits  TROPONIN I (HIGH SENSITIVITY) - Abnormal; Notable for the following components:   Troponin I (High Sensitivity) 22 (*)    All other components within normal limits  BASIC METABOLIC PANEL  CBC  TYPE AND SCREEN     MDM  On arrival patient was tachycardic with soft blood pressure.  Given 500 bolus of IV fluids and will reevaluate given shortage of IV fluids currently.  No obvious signs of an infectious process.  Patient's troponin elevated but is stable at his baseline.  Chronic anemia.  No significant leukocytosis.  Creatinine is at baseline.  Consulted and discussed the patient's case with orthopedics who came and evaluated the patient in the emergency department.  On reevaluation continued to be tachycardic with dry mucous membranes, given 1 L of IV fluids for total of 1500.  Did have significant improvement of his tachycardia following 1 L.  Clinical picture is concerning for severe dehydration.  New periprosthetic left hip fracture.  Consulted hospitalist for admission.     PROCEDURES:  Critical Care performed: No  Procedures  Patient's presentation is most consistent with acute presentation with potential threat to life or bodily function.   MEDICATIONS ORDERED IN ED: Medications  HYDROcodone-acetaminophen (NORCO/VICODIN) 5-325 MG per tablet 1-2 tablet (has no administration in time range)  morphine (PF) 2 MG/ML injection 2 mg (has no administration in time range)  methocarbamol (ROBAXIN) tablet 500 mg (has no administration in time range)    Or  methocarbamol (ROBAXIN) 500 mg in dextrose 5 % 50 mL IVPB (has no administration in time range)  polyethylene glycol (MIRALAX / GLYCOLAX) packet 17 g (has no administration in time range)  bisacodyl (DULCOLAX) EC tablet 5 mg (has no administration in time range)  lactated ringers infusion (has no administration in time range)  HYDROmorphone (DILAUDID) injection 0.5 mg (0.5 mg Intravenous  Given 03/09/23 1243)  sodium chloride 0.9 % bolus 500 mL (0 mLs Intravenous Stopped 03/09/23 1340)  sodium chloride 0.9 % bolus 1,000 mL (0 mLs Intravenous Stopped 03/09/23 1629)  HYDROmorphone (DILAUDID) injection 0.5 mg (0.5 mg Intravenous Given 03/09/23 1455)  HYDROmorphone (DILAUDID) injection 0.5 mg (0.5 mg Intravenous Given 03/09/23 1807)    FINAL CLINICAL IMPRESSION(S) / ED DIAGNOSES   Final diagnoses:  Fall, initial encounter  Closed fracture of left hip, initial encounter (HCC)  Dehydration     Rx / DC  Orders   ED Discharge Orders     None        Note:  This document was prepared using Dragon voice recognition software and may include unintentional dictation errors.   Corena Herter, MD 03/09/23 1958

## 2023-03-09 NOTE — Consult Note (Signed)
ORTHOPEDIC CONSULTATION  Aaron Short [id number]  03/09/2023  CC:  Chief Complaint  Patient presents with   Fall    History of Present IlIness: The patient is a 77 y.o. male who presented to the local emergency department on 03/09/2023 after ground-level fall at home.  The patient reports that he lives with his wife and was in the kitchen and went to place something on the counter and turned around and got dizzy and the room started spinning and he fell landing on his left hip.  Further questioning in the review of the medical record reveals that the patient had a similar ground-level fall and was evaluated at the same emergency department with radiographs on 02-28-2023 and found to have a very minimally displaced right greater trochanteric periprosthetic fracture around his right total hip arthroplasty.  Orthopedics was consulted and based on the fracture pattern and the patient's overall medical condition nonoperative treatment was recommended.  The patient was able to be discharged back home in the care of his wife.  PMH:  Past Medical History:  Diagnosis Date   Arthralgia of lower leg 04/02/2012   Arthralgia of upper arm 06/18/2009   Cancer (HCC)    Diabetes mellitus without complication (HCC)    FH: atrial fibrillation    Hypertension     SH:  Past Surgical History:  Procedure Laterality Date   bladder cancer surgery  2015   ESOPHAGOGASTRODUODENOSCOPY (EGD) WITH PROPOFOL N/A 10/30/2022   Procedure: ESOPHAGOGASTRODUODENOSCOPY (EGD) WITH PROPOFOL;  Surgeon: Toney Reil, MD;  Location: [facility] ENDOSCOPY;  Service: Gastroenterology;  Laterality: N/A;   IR IMAGING GUIDED PORT INSERTION  11/13/2022   JOINT REPLACEMENT     REPLACEMENT TOTAL HIP W/  RESURFACING IMPLANTS Bilateral    SHOULDER SURGERY Bilateral    TOTAL KNEE ARTHROPLASTY Bilateral     ALL:  Allergies  Allergen Reactions   Diltiazem Swelling    Leg edema Leg edema Leg edema Leg edema Leg edema Leg  edema Leg edema Leg edema Leg edema   Penicillins Hives and Rash    Had hives as child Other reaction(s): RASH Childhood allergy Other reaction(s): RASH Other reaction(s): RASH Childhood allergy Other reaction(s): RASH Other reaction(s): RASH Childhood allergy   Tizanidine Other (See Comments)    Other reaction(s): OTHER  Pt states it puts him out Other reaction(s): OTHER  Pt states it puts him out Other reaction(s): OTHER  Pt states it puts him out Other reaction(s): OTHER  Pt states it puts him out Other reaction(s): OTHER  Pt states it puts him out Other reaction(s): OTHER  Pt states it puts him out    MED: (Not in a hospital admission)   All home medications have been reviewed as documented in the medication reconciliation portion of the patient record.  FH:  Family History  Problem Relation Age of Onset   Dementia Mother    Heart disease Father     Social:  reports that he has quit smoking. His smoking use included cigarettes. He has never used smokeless tobacco. He reports that he does not drink alcohol and does not use drugs.  Review of Systems: General: Denies fever, chills, weight loss Eyes: Denies blurry vision, changes in vision ENT: Denies sore throat, congestions, nosebleeds CV: Denies chest pain, palpitations Respiratory: Denies shortness of breath, wheezing, cough Gl: Denies abdominal pain, nausea, vomiting GU: Denies hematuria Integumentary: Denies rashes or lesions Neuro: Denies headache, dizziness Psych: Negative Hem/Onc: Denies easy bruising or bleeding disorders Musculoskeletal: See HPI  above.  Vitals: BP (!) 101/54   Pulse 86   Temp 97.9 F (36.6 C)   Resp 18   SpO2 97%    Physical Exam: General: Awake, alert and oriented, no acute distress. Eyes: Pupils reactive, EOMI, normal conjunctiva, no scleral icterus. HENT: Normocephalic, atraumatic, normal hearing, moist oral mucosa Neck: Supple, non-tender, no cervical  lymphadenopathy. Lungs: Chest rise is symmetric, non-labored respiration, chest wall nontender to palpation Heart: Normal rate by palpation, normal peripheral perfusion Abdomen: Soft, non-tender, non-distended. Pelvis is stable. Skin: Skin envelope intact, dry and pink, no rashes or lesions, no signs of infection. Neurologic: Awake, alert, and oriented X3 Psychiatric: Cooperative, appropriate mood and affect.  Musculoskeletal: Evaluation the patient's bilateral lower extremities reveals he has decreased range of motion in both hips and tenderness to palpation around the greater trochanter regions.  He has no tenderness to palpation of his bilateral distal femurs, knees, tib-fib regions, ankle or feet.  Dorsalis pedis and posterior tibial pulse are 1+ and symmetric.  His toes are pink and warm with a brisk capillary refill time.  Radiographic findings: Radiographs of the patient's left hip dated 03/09/2023 were evaluated on the PACS system.  The patient has a left total hip arthroplasty with a periprosthetic nondisplaced fracture of the proximal anterior aspect of the femur near the collar of the stem.  This is a new finding.  No pelvic fractures are noted.  Compared to the previous radiographs of the patient's right periprosthetic greater trochanteric fracture of 02/28/2023 there is been further displacement of the right periprosthetic greater trochanteric fracture.   Labs:  Recent Labs    03/09/23 1220  HGB 9.2*   Recent Labs    03/09/23 1220  WBC 11.8*  RBC 2.67*  HCT 28.8*  PLT 337   Recent Labs    03/09/23 1220  NA 137  K 3.8  CL 97*  CO2 26  BUN 18  CREATININE 0.61  GLUCOSE 135*  CALCIUM 8.7*   No results for input(s): "LABPT", "INR" in the last 72 hours.   Assessment/Plan:    Assessment: 77 year old male status post left and right total hip arthroplasties with recent right periprosthetic greater trochanteric fracture radiograph 02-28-2023 and new left proximal  anterior femur periprosthetic femur fracture around the total hip arthroplasty.   Plan:  I discussed with the patient that he is being admitted for some assistance with discharge planning as with to recent falls and periprosthetic hip fractures around his left and right total hip arthroplasties this is going to extremely limit his safe ambulation at home.  Once again based on his overall medical condition and fracture patterns hide recommend very conservative nonoperative treatment.  The patient reports his wife went home for the evening but will return tomorrow and during rounds hopefully we can have a family discussion of the patient's care plan.  At this stage the patient can have his regular diet as no surgery is planned.  Cecil Cranker M.D. 03/09/2023 7:59 PM

## 2023-03-09 NOTE — Hospital Course (Addendum)
Just pending to sign note.

## 2023-03-09 NOTE — Assessment & Plan Note (Signed)
Orthopedics has been extremely supportive and efficient and patient was seen in the emergency room, with plans for nonoperative management. Pain control.  PT plan per ortho.

## 2023-03-10 DIAGNOSIS — Y92009 Unspecified place in unspecified non-institutional (private) residence as the place of occurrence of the external cause: Secondary | ICD-10-CM | POA: Diagnosis not present

## 2023-03-10 DIAGNOSIS — W19XXXA Unspecified fall, initial encounter: Secondary | ICD-10-CM | POA: Diagnosis not present

## 2023-03-10 DIAGNOSIS — S72002A Fracture of unspecified part of neck of left femur, initial encounter for closed fracture: Secondary | ICD-10-CM | POA: Diagnosis not present

## 2023-03-10 LAB — CBC
HCT: 24.4 % — ABNORMAL LOW (ref 39.0–52.0)
Hemoglobin: 8 g/dL — ABNORMAL LOW (ref 13.0–17.0)
MCH: 35.1 pg — ABNORMAL HIGH (ref 26.0–34.0)
MCHC: 32.8 g/dL (ref 30.0–36.0)
MCV: 107 fL — ABNORMAL HIGH (ref 80.0–100.0)
Platelets: 286 10*3/uL (ref 150–400)
RBC: 2.28 MIL/uL — ABNORMAL LOW (ref 4.22–5.81)
RDW: 16.5 % — ABNORMAL HIGH (ref 11.5–15.5)
WBC: 9.6 10*3/uL (ref 4.0–10.5)
nRBC: 0 % (ref 0.0–0.2)

## 2023-03-10 LAB — BASIC METABOLIC PANEL
Anion gap: 11 (ref 5–15)
BUN: 12 mg/dL (ref 8–23)
CO2: 26 mmol/L (ref 22–32)
Calcium: 8.2 mg/dL — ABNORMAL LOW (ref 8.9–10.3)
Chloride: 100 mmol/L (ref 98–111)
Creatinine, Ser: 0.41 mg/dL — ABNORMAL LOW (ref 0.61–1.24)
GFR, Estimated: >60 mL/min (ref >60)
Glucose, Bld: 87 mg/dL (ref 70–99)
Potassium: 3.8 mmol/L (ref 3.5–5.1)
Sodium: 137 mmol/L (ref 135–145)

## 2023-03-10 MED ORDER — PANTOPRAZOLE SODIUM 40 MG PO TBEC
40.0000 mg | DELAYED_RELEASE_TABLET | Freq: Every day | ORAL | Status: DC
  Administered 2023-03-10 – 2023-03-21 (×12): 40 mg via ORAL
  Filled 2023-03-10 (×12): qty 1

## 2023-03-10 MED ORDER — LIDOCAINE-PRILOCAINE 2.5-2.5 % EX CREA
TOPICAL_CREAM | Freq: Once | CUTANEOUS | Status: AC
  Filled 2023-03-10: qty 5

## 2023-03-10 MED ORDER — ENSURE ENLIVE PO LIQD
237.0000 mL | Freq: Two times a day (BID) | ORAL | Status: DC
  Administered 2023-03-10: 237 mL via ORAL

## 2023-03-10 MED ORDER — ONDANSETRON HCL 4 MG PO TABS
8.0000 mg | ORAL_TABLET | Freq: Three times a day (TID) | ORAL | Status: DC | PRN
  Administered 2023-03-10: 8 mg via ORAL
  Filled 2023-03-10 (×3): qty 2

## 2023-03-10 MED ORDER — RIVAROXABAN 20 MG PO TABS
20.0000 mg | ORAL_TABLET | Freq: Every day | ORAL | Status: DC
  Administered 2023-03-10 – 2023-03-21 (×12): 20 mg via ORAL
  Filled 2023-03-10 (×13): qty 1

## 2023-03-10 MED ORDER — ORAL CARE MOUTH RINSE: 15.0000 mL | OROMUCOSAL | Status: DC | PRN

## 2023-03-10 MED ORDER — CARISOPRODOL 350 MG PO TABS
350.0000 mg | ORAL_TABLET | Freq: Two times a day (BID) | ORAL | Status: DC
  Administered 2023-03-10 – 2023-03-21 (×23): 350 mg via ORAL
  Filled 2023-03-10 (×24): qty 1

## 2023-03-10 MED ORDER — DRONABINOL 2.5 MG PO CAPS
2.5000 mg | ORAL_CAPSULE | Freq: Two times a day (BID) | ORAL | Status: DC
  Administered 2023-03-10 – 2023-03-21 (×22): 2.5 mg via ORAL
  Filled 2023-03-10 (×22): qty 1

## 2023-03-10 MED ORDER — LORAZEPAM 0.5 MG PO TABS
0.5000 mg | ORAL_TABLET | Freq: Three times a day (TID) | ORAL | Status: DC | PRN
  Administered 2023-03-10 – 2023-03-19 (×10): 0.5 mg via ORAL
  Filled 2023-03-10 (×10): qty 1

## 2023-03-10 MED ORDER — SERTRALINE HCL 50 MG PO TABS
100.0000 mg | ORAL_TABLET | Freq: Every day | ORAL | Status: DC
  Administered 2023-03-10 – 2023-03-20 (×11): 100 mg via ORAL
  Filled 2023-03-10 (×11): qty 2

## 2023-03-10 MED ORDER — PROCHLORPERAZINE MALEATE 10 MG PO TABS
10.0000 mg | ORAL_TABLET | Freq: Four times a day (QID) | ORAL | Status: DC | PRN
  Filled 2023-03-10: qty 1

## 2023-03-10 MED ORDER — MIDODRINE HCL 5 MG PO TABS
5.0000 mg | ORAL_TABLET | Freq: Three times a day (TID) | ORAL | Status: DC
  Administered 2023-03-10 – 2023-03-21 (×34): 5 mg via ORAL
  Filled 2023-03-10 (×34): qty 1

## 2023-03-10 NOTE — Progress Notes (Signed)
Progress Note   Patient: Aaron Short WUJ:[id number] DOB: February 04, 1946 DOA: 03/09/2023     1 DOS: the patient was seen and examined on 03/10/2023   Brief hospital course: 77 y.o. male who presented to the local emergency department on 03/09/2023 after ground-level fall at home.  The patient reports that he lives with his wife and was in the kitchen and went to place something on the counter and turned around and got dizzy and the room started spinning and he fell landing on his left hip.  Further questioning in the review of the medical record reveals that the patient had a similar ground-level fall and was evaluated at the same emergency department with radiographs on 02-28-2023 and found to have a very minimally displaced right greater trochanteric periprosthetic fracture around his right total hip arthroplasty.  Orthopedics was consulted and based on the fracture pattern and the patient's overall medical condition nonoperative treatment was recommended.  The patient was able to be discharged back home in the care of his wife.   10/12 : Patient seen by orthopedics recommends conservative management.  PT/OT  eval pending.  Patient agreeable to SNF however son and wife considering home.  As this is patient's second fracture/fall in the last 2 weeks and he is extremely frail SNF would benefit patient.  Assessment and Plan:  Fall at home, initial encounter : Attribute to patient's low blood pressure.  Suspect combination of factors for patient's fall, including medications, deconditioning, recent right hip fracture. Currently fall precautions.  And fall prevention measures at home or consideration for SNF.  Closed left hip fracture, initial encounter :  Orthopedics  plans for nonoperative management. Pain control.  PT plan per ortho.   Chronic anemia :  Chart review shows patient has chronic anemia since June 2024 as far as I can go back.  With his history of low blood pressure and  chronic anemia of course there is suspicion for GI loss. We can do stool occult and screen.  Type and screen.  IV PPI.  Hypertension, benign :   Patient is on midodrine for hypotension will continue with the same.  Atrial fibrillation and flutter  EKG changes A-fib with PVCs, low voltage, QTc of 395, nonspecific ST-T wave changes. Continue Xarelto.       Subjective: Laying in bed son by bedside no active issues at the time resume resume medications.  Physical Exam: Vitals:   03/10/23 0030 03/10/23 0330 03/10/23 0600 03/10/23 0825  BP:  (!) 101/58 (!) 119/57 (!) 102/53  Pulse: 98 89 94 96  Resp: 20 (!) 22 19 17   Temp: 97.9 F (36.6 C) 97.8 F (36.6 C)  97.9 F (36.6 C)  TempSrc: Oral Rectal  Oral  SpO2: 95% 96% 99% 98%   Physical Exam Constitutional:      Appearance: He is ill-appearing.     Comments: Frail looking pale man not in acute distress.  HENT:     Head: Normocephalic and atraumatic.  Eyes:     Extraocular Movements: Extraocular movements intact.     Pupils: Pupils are equal, round, and reactive to light.  Cardiovascular:     Rate and Rhythm: Normal rate and regular rhythm.  Pulmonary:     Effort: Pulmonary effort is normal.  Abdominal:     General: Abdomen is flat.     Palpations: Abdomen is soft.  Musculoskeletal:        General: Tenderness present.  Neurological:     General: No focal deficit  present.     Mental Status: He is oriented to person, place, and time.  Psychiatric:        Mood and Affect: Mood normal.     Data Reviewed:  There are no new results to review at this time.  Family Communication: Son updated about the status.  Disposition: Status is: Inpatient Remains inpatient appropriate because: Fall and fracture  Planned Discharge Destination:  TBD    Time spent: 32 minutes  Author: Kirstie Peri, MD 03/10/2023 8:36 AM  For on call review [email or web address].

## 2023-03-10 NOTE — Evaluation (Signed)
Occupational Therapy Evaluation Patient Details Name: Aaron Short MRN: [id number] DOB: 08-17-1945 Today's Date: 03/10/2023   History of Present Illness Aaron Short is a 77 y.o. male with medical history significant for esophageal adenocarcinoma, chronic pain, recent right periprosthetic hip fracture (hospitalized 10/4-10/2022), diabetes mellitus type 2, atrial fibrillation on Xarelto, hypertension, patient brought by EMS with complaints of fall leading to left hip pain. Per EDMD, patient was in the kitchen and turned around, felt dizzy and fell, landing on his hip on the left side. Patient reports pain, is unable to bear weight. Pt now with a periprosthetic fracture around R hip (from 10/4 fall) and now L hip (from 10/11 fall).   Clinical Impression   Pt presents to hospital following his third fall in the past few months that has resulted in significant injury (in addition to at least 2 other falls this year that did not cause injury). Pt now has periprosthetic fractures around both R (from 10/4 fall) and L (from 10/11 fall) hips. Pt's wife describes his balance as "absolutely terrible." Pt is now under non-weightbearing orders for b/l LE. Pt is able to roll gently to R and L side in bed with Mod A; he endorses increase in pain with this movement. Pain at 8/10 in static supine position. Provided educ re: need to consider goals of care. Pt reports it is his goal to return home, although both pt and wife verbalize understanding that this would be very difficult given pt's current condition. They state that pt had been undergoing chemotherapy for esophageal adenocarcinoma since May 2024, but that oncologist halted tx in August, stating pt was too weak to undergo further chemotherapy at the time. Recommend hospice/palliative care consult.     If plan is discharge home, recommend the following: A lot of help with walking and/or transfers;A lot of help with  bathing/dressing/bathroom;Assistance with feeding;Assistance with cooking/housework    Functional Status Assessment  Patient has had a recent decline in their functional status and/or demonstrates limited ability to make significant improvements in function in a reasonable and predictable amount of time  Equipment Recommendations  None recommended by OT    Recommendations for Other Services Other (comment) (palliative care/hospice consult)     Precautions / Restrictions Precautions Precautions: Fall;Other (comment) Precaution Comments: Per order from orthopedic surgeon [name] on 03/10/2023: "The patient is NWB on the bilateral LE. May have transfer training bed to chair with walker, slide board with minimal weight bearing." Restrictions Weight Bearing Restrictions: Yes RLE Weight Bearing: Non weight bearing LLE Weight Bearing: Non weight bearing Other Position/Activity Restrictions: Per Dr. Zettie Cooley consultation on 03/02/2023, following R hip fx: Patient may weight-bear as tolerated on the right lower extremity. Avoid all hip abduction. Per Dr. Signa Kell instant message (03/03/2023): brace should be set at 0 degrees abduction and 0-90 degrees ext/flex and worn at all times unless he is not tolerating it, at which time it can be removed while in bed as long as patient does not abduct his right hip.      Mobility Bed Mobility Overal bed mobility: Needs Assistance Bed Mobility: Rolling Rolling: Mod assist         General bed mobility comments: Pain with all movement    Transfers Overall transfer level: Needs assistance                 General transfer comment: Pt declines coming to edge of bed sitting, citing pain. Anticipate +2 assist for transfer to chair to  adhere to non-WBing precautions.      Balance Overall balance assessment: Needs assistance, History of Falls   Sitting balance-Leahy Scale: Poor       Standing balance-Leahy Scale: Zero Standing  balance comment: Pt has non-WBing precautions at present. Wife states that pt's balance "has been absolutely terrible" for several years.               High Level Balance Comments: Pt and wife state that pt has had 2 falls in the shower this year that did not result in injuries, and 1 fall out in the community that resulted in serious injury, as pt was alone, unable to get up, and had to crawl on the grass for some distances, plus the 2 falls this month, each of which has resulted in a hip fx.           ADL either performed or assessed with clinical judgement   ADL Overall ADL's : Needs assistance/impaired                                       General ADL Comments: Max A for LB dressing, bathing, toileting. Mod A for UB dressing. Min A for grooming following set-up.     Vision         Perception         Praxis         Pertinent Vitals/Pain Pain Assessment Pain Assessment: 0-10 Pain Score: 8  Pain Location: bilateral hips Pain Descriptors / Indicators: Aching, Grimacing, Guarding Pain Intervention(s): Limited activity within patient's tolerance, Other (comment) (limited activity per WBing restrictions)     Extremity/Trunk Assessment Upper Extremity Assessment Upper Extremity Assessment: Generalized weakness   Lower Extremity Assessment Lower Extremity Assessment: LLE deficits/detail;RLE deficits/detail RLE Deficits / Details: Non-weightbearing LLE Deficits / Details: Non-weightbearing       Communication Communication Communication: No apparent difficulties   Cognition Arousal: Alert   Overall Cognitive Status: Within Functional Limits for tasks assessed                                       General Comments       Exercises Other Exercises Other Exercises: Educ re: DC recs, goals of care, WBing restrictions, caregiver education   Shoulder Instructions      Home Living Family/patient expects to be discharged to::  Private residence Living Arrangements: Spouse/significant other;Children Available Help at Discharge: Family;Available 24 hours/day Type of Home: House Home Access: Stairs to enter [employer] of Steps: 4 (from garage or front) Entrance Stairs-Rails: Right;Left Home Layout: One level     Bathroom Shower/Tub: Producer, television/film/video: Handicapped height     Home Equipment: Agricultural consultant (2 wheels);Cane - single point;Shower seat;Wheelchair - manual   Additional Comments: w/c has small wheels, has tempurpedic bed that moves up and down at head and foot      Prior Functioning/Environment Prior Level of Function : Needs assist             Mobility Comments: until recent fall, could ambulate short distances w/ RW, had still been driving ADLs Comments: Requires assistance with all ADL other than self-feeding, after several falls with significant injuries in past months        OT Problem List: Decreased strength;Decreased range of motion;Decreased activity tolerance;Impaired  balance (sitting and/or standing);Pain;Decreased coordination      OT Treatment/Interventions: Self-care/ADL training;Therapeutic exercise;Neuromuscular education;Patient/family education;DME and/or AE instruction;Therapeutic activities;Balance training    OT Goals(Current goals can be found in the care plan section) Acute Rehab OT Goals Patient Stated Goal: to go home with family OT Goal Formulation: With patient/family Time For Goal Achievement: 03/24/23 Potential to Achieve Goals: Fair  OT Frequency: Min 1X/week    Co-evaluation              AM-PAC OT "6 Clicks" Daily Activity     Outcome Measure Help from another person eating meals?: A Little Help from another person taking care of personal grooming?: A Little Help from another person toileting, which includes using toliet, bedpan, or urinal?: A Lot Help from another person bathing (including washing, rinsing, drying)?:  A Lot Help from another person to put on and taking off regular upper body clothing?: A Lot Help from another person to put on and taking off regular lower body clothing?: A Lot 6 Click Score: 14   End of Session    Activity Tolerance: Patient limited by pain;Patient limited by fatigue;Other (comment) (Pt has non-WBing precautions) Patient left: in bed;with family/visitor present  OT Visit Diagnosis: Unsteadiness on feet (R26.81);History of falling (Z91.81);Repeated falls (R29.6);Muscle weakness (generalized) (M62.81);Pain;Other abnormalities of gait and mobility (R26.89) Pain - part of body: Hip (bilaterally)                Time: 7829-5621 OT Time Calculation (min): 13 min Charges:  OT General Charges $OT Visit: 1 Visit OT Evaluation $OT Eval High Complexity: 1 High Latina Craver, PhD, MS, OTR/L 03/10/23, 4:26 PM

## 2023-03-10 NOTE — Progress Notes (Signed)
PT Cancellation Note  Patient Details Name: Aaron Short MRN: [id number] DOB: [birth year]   Cancelled Treatment:    Reason Eval/Treat Not Completed: Fatigue/lethargy limiting ability to participate. PT orders received and pt chart reviewed. Pt in supine with spouse and OT at bedside upon entry. Pt politely declining participation with PT/OT evaluation at this time due to fatigue from moving up to floor and changing beds. Agreeable for PT evaluation tomorrow.     Vira Blanco, PT, DPT 3:24 PM,03/10/23 Physical Therapist - [facility]

## 2023-03-10 NOTE — Progress Notes (Signed)
ORTHOPEDIC PROGRESS NOTE  SUBJECTIVE:     The patient is resting quietly in bed. The patient reports their pain is controlled with medications.  Reports no changes overnight.  His wife is at home and not present at bedside this morning.  OBJECTIVE:  Vitals:   03/10/23 0825 03/10/23 0934  BP: (!) 102/53 105/61  Pulse: 96 86  Resp: 17 16  Temp: 97.9 F (36.6 C) 98 F (36.7 C)  SpO2: 98% 96%    The patient's bilateral lower extremities are neurovascularly intact.  Their toes are pink and warm with brisk capillary refill time.  Dorsalis pedis and posterior tibial pulse are 2+ and symmetric. The patient's calves are soft, nontender, with a negative Homans test bilaterally.  The patient's bilateral hip exams are stable and unchanged.  Labs:  Recent Labs    03/09/23 1220 03/10/23 0457  HGB 9.2* 8.0*   Recent Labs    03/09/23 1220 03/10/23 0457  WBC 11.8* 9.6  RBC 2.67* 2.28*  HCT 28.8* 24.4*  PLT 337 286   Recent Labs    03/09/23 1220 03/10/23 0457  NA 137 137  K 3.8 3.8  CL 97* 100  CO2 26 26  BUN 18 12  CREATININE 0.61 0.41*  GLUCOSE 135* 87  CALCIUM 8.7* 8.2*   No results for input(s): "LABPT", "INR" in the last 72 hours.    ASSESSMENT:  Assessment: 77 year old male status post left and right total hip arthroplasties with recent right periprosthetic greater trochanteric fracture radiograph 02-28-2023 and new left proximal anterior femur periprosthetic femur fracture around the total hip arthroplasty.     The patient is making slow progress as expected based on their overall medical condition.     PLAN:  Continue DVT prophylaxis. Discharge planning for subacute rehab placement/skilled nursing facility.  Once again I discussed with the patient that because of his multiple recent falls and left and right periprosthetic hip fractures I would recommend nonoperative treatment.  With bilateral injuries over the last week is weightbearing will be significantly  limited because of pain and I do not believe he can be discharged home safely.  Discharge planning is ongoing.  Will follow along with you.   Cecil Cranker M.D. 03/10/2023 10:41 AM

## 2023-03-10 NOTE — Plan of Care (Signed)

## 2023-03-10 NOTE — Progress Notes (Signed)
Approximately 0940--Pt arrived from ED to room 135. Upon arrival, pt A&O x 4 and VS obtained. Pt reporting discomfort in bilateral hips and sacrum. Pt oriented to room and unit. All fall precautions in place. MD notified of pt's arrival.

## 2023-03-11 DIAGNOSIS — Y92009 Unspecified place in unspecified non-institutional (private) residence as the place of occurrence of the external cause: Secondary | ICD-10-CM | POA: Diagnosis not present

## 2023-03-11 DIAGNOSIS — S72002A Fracture of unspecified part of neck of left femur, initial encounter for closed fracture: Secondary | ICD-10-CM | POA: Diagnosis not present

## 2023-03-11 DIAGNOSIS — W19XXXA Unspecified fall, initial encounter: Secondary | ICD-10-CM | POA: Diagnosis not present

## 2023-03-11 DIAGNOSIS — Z515 Encounter for palliative care: Secondary | ICD-10-CM | POA: Diagnosis not present

## 2023-03-11 MED ORDER — CHLORHEXIDINE GLUCONATE CLOTH 2 % EX PADS
6.0000 | MEDICATED_PAD | Freq: Every day | CUTANEOUS | Status: DC
  Administered 2023-03-12 – 2023-03-21 (×11): 6 via TOPICAL

## 2023-03-11 MED ORDER — LOPERAMIDE HCL 2 MG PO CAPS
2.0000 mg | ORAL_CAPSULE | ORAL | Status: DC | PRN
  Administered 2023-03-11 – 2023-03-20 (×3): 2 mg via ORAL
  Filled 2023-03-11 (×3): qty 1

## 2023-03-11 MED ORDER — SODIUM CHLORIDE 0.9% FLUSH: 10.0000 mL | INTRAVENOUS | Status: DC | PRN

## 2023-03-11 MED ORDER — SODIUM CHLORIDE 0.9% FLUSH
10.0000 mL | Freq: Two times a day (BID) | INTRAVENOUS | Status: DC
  Administered 2023-03-11 – 2023-03-21 (×17): 10 mL

## 2023-03-11 NOTE — Discharge Instructions (Signed)
ORTHOPEDIC DISCHARGE INSTRUCTIONS  Follow Up Appointment:  Follow-up in the office in 10-14 days.  Please contact the [facility] at [phone]  to schedule your follow-up appointment.  Dressing and cast care instructions:  No dressing is needed.  Weight-bearing status:  You are nonweightbearing on the bilateral lower extremities.  Gentle passive range of motion with physical therapy is appropriate.  May do slide board transfers.  Fall precautions.   Diet:   You may resume your regular diet as tolerated.  Begin with clear liquids and slowly advance to your normal diet.  Medications:   Take your medicines as prescribed. You may have written prescriptions or your prescriptions may have been E-prescribed to your pharmacy. If you were given prescriptions for any blood thinners, it is a very important that you take these medications as prescribed to prevent blood clots.  General instructions:  Elevate the affected extremity to control pain and swelling. Apply ice packs to the affected area to control pain and swelling.  Surgery and pain medications may lead to constipation. It is easier to prevent this than it is to treat it. We recommend MiraLAX or Senna/Senakot for laxatives and Colace as a stool softener as needed after surgery. Drink plenty of fluids to stay well-hydrated. Consult your local pharmacist for other treatment recommendations.   Please perform deep breathing exercises every hour to increase the airflow in your lungs which could predispose you to running a low-grade fever and increase your risk of pneumonia.

## 2023-03-11 NOTE — Progress Notes (Signed)
Progress Note   Patient: Aaron Short WCB:[id number] DOB: December 17, 1945 DOA: 03/09/2023     2 DOS: the patient was seen and examined on 03/11/2023   Brief hospital course: 77 y.o. male who presented to the local emergency department on 03/09/2023 after ground-level fall at home.  The patient reports that he lives with his wife and was in the kitchen and went to place something on the counter and turned around and got dizzy and the room started spinning and he fell landing on his left hip.  Further questioning in the review of the medical record reveals that the patient had a similar ground-level fall and was evaluated at the same emergency department with radiographs on 02-28-2023 and found to have a very minimally displaced right greater trochanteric periprosthetic fracture around his right total hip arthroplasty.  Orthopedics was consulted and based on the fracture pattern and the patient's overall medical condition nonoperative treatment was recommended.  The patient was able to be discharged back home in the care of his wife.   10/12 : Patient seen by orthopedics recommends conservative management.  PT/OT  eval pending.  Patient agreeable to SNF however son and wife considering home.  As this is patient's second fracture/fall in the last 2 weeks and he is extremely frail SNF would benefit patient.  10/13 : Palliative consulted secondary to extreme frailty in the setting of  fractures and  neoplastic pathology.  Patient will need SNF versus hospice.  Pending palliative eval.  Assessment and Plan:  Fall at home, initial encounter : Attribute to patient's low blood pressure.  Suspect combination of factors for patient's fall, including medications, deconditioning, recent right hip fracture. Currently fall precautions.  And fall prevention measures at home or consideration for SNF.  Closed left hip fracture, initial encounter :  Orthopedics  plans for nonoperative management. Pain  control.  PT plan per ortho.   Chronic anemia :  Chart review shows patient has chronic anemia since June 2024 as far as I can go back.  With his history of low blood pressure and chronic anemia of course there is suspicion for GI loss. We can do stool occult and screen.  Type and screen.  IV PPI.  Hypertension, benign :   Patient is on midodrine for hypotension will continue with the same.  Atrial fibrillation and flutter  EKG changes A-fib with PVCs, low voltage, QTc of 395, nonspecific ST-T wave changes. Continue Xarelto.       Subjective: Patient seen in the morning rounds lying in bed.  Breakfast by bedside has not had a single diet.  No overnight concerns by nursing.  Patient arousable with no active complaint.  Physical Exam: Vitals:   03/10/23 1341 03/10/23 1604 03/10/23 2316 03/11/23 0804  BP: 105/64 (!) 99/54 (!) 102/57 109/61  Pulse: 89 82 84 100  Resp: 16 16 16 16   Temp: 98.4 F (36.9 C) (!) 97.5 F (36.4 C) 97.8 F (36.6 C) 97.7 F (36.5 C)  TempSrc:   Oral   SpO2: 98% 96% 97% 94%   Physical Exam Constitutional:      General: He is not in acute distress.    Appearance: He is ill-appearing.     Comments: Frail looking pale man not in acute distress.  HENT:     Head: Normocephalic and atraumatic.  Eyes:     Extraocular Movements: Extraocular movements intact.     Pupils: Pupils are equal, round, and reactive to light.  Cardiovascular:  Rate and Rhythm: Normal rate and regular rhythm.  Pulmonary:     Effort: Pulmonary effort is normal.  Abdominal:     General: Abdomen is flat.     Palpations: Abdomen is soft.  Musculoskeletal:        General: Tenderness present.  Neurological:     General: No focal deficit present.     Mental Status: He is oriented to person, place, and time.  Psychiatric:        Mood and Affect: Mood normal.     Data Reviewed:  There are no new results to review at this time.  Family Communication: Son updated about the  status.  Disposition: Status is: Inpatient Remains inpatient appropriate because: Fall and fracture  Planned Discharge Destination:  TBD likely SNF    Time spent: 32 minutes  Author: Kirstie Peri, MD 03/11/2023 1:26 PM  For on call review [email or web address].

## 2023-03-11 NOTE — Evaluation (Signed)
Physical Therapy Evaluation Patient Details Name: Aaron Short MRN: [id number] DOB: 09-Aug-1945 Today's Date: 03/11/2023  History of Present Illness  Pt is a 76 y/o M admitted on 03/09/23 after presenting with c/o a fall. Pt also noted to have a fall on 02/28/23 as well. Pt with very minimally displaced R greater trochanter periprosthetic fx around R THA and now with L proximal anterior femur periprosthetic femur fx around the THA. Orthopedics recommending conservative management. PMH: CA, DM, a-fib, HTN  Clinical Impression  Pt seen for PT evaluation with pt received asleep in bed. Pt requires max cuing, cold wet cloth on forehead, repositioning HOB to increase alertness & engage with PT. Pt is AxOx3, speaks at low volume.  Pt requires assistance for repositioning in bed, assistance to perform ROM to BLE. Pt with behaviors demonstrating pain in BLE hips (L>R). Pt with ongoing lethargy limiting session, no family present. Will continue to follow pt acutely to address strengthening, bed mobility & pt/family education.      If plan is discharge home, recommend the following: Direct supervision/assist for financial management;Help with stairs or ramp for entrance;Direct supervision/assist for medications management;Two people to help with walking and/or transfers;Two people to help with bathing/dressing/bathroom;Assistance with cooking/housework;Assistance with feeding   Can travel by private vehicle   No    Equipment Recommendations BSC/3in1;Hospital bed;Wheelchair (measurements PT);Wheelchair cushion (measurements PT);Hoyer lift  Recommendations for [employer]       Functional Status Assessment Patient has had a recent decline in their functional status and demonstrates the ability to make significant improvements in function in a reasonable and predictable amount of time.     Precautions / Restrictions Precautions Precautions: Fall Restrictions Weight Bearing Restrictions:  Yes RLE Weight Bearing: Non weight bearing LLE Weight Bearing: Non weight bearing Other Position/Activity Restrictions: Per Dr. Rosann Auerbach, previously ordered R hip abduction brace is not necessary, gentle PROM okay.      Mobility  Bed Mobility               General bed mobility comments: Pt required assistance to pull trunk from semi fowler to long sitting in bed with bilateral bed rails.    Transfers                        Ambulation/Gait                  Stairs            Wheelchair Mobility     Tilt Bed    Modified Rankin (Stroke Patients Only)       Balance                                             Pertinent Vitals/Pain Pain Assessment Pain Assessment: Faces Faces Pain Scale: Hurts little more Pain Location: BLE hips (L appears more painful than R) Pain Descriptors / Indicators: Grimacing, Guarding Pain Intervention(s): Monitored during session, Limited activity within patient's tolerance, Repositioned    Home Living Family/patient expects to be discharged to:: Private residence Living Arrangements: Spouse/significant other;Children Available Help at Discharge: Family;Available 24 hours/day Type of Home: House Home Access: Stairs to enter Entrance Stairs-Rails: Doctor, general practice of Steps: 4 (from garage or front)   Home Layout: One level Home Equipment: Agricultural consultant (2 wheels);Cane - single point;Shower seat;Wheelchair - manual Additional Comments: w/c has  small wheels, has tempurpedic bed that moves up and down at head and foot    Prior Function Prior Level of Function : Needs assist             Mobility Comments: until first fall, could ambulate short distances w/ RW, had still been driving       Extremity/Trunk Assessment                Communication   Communication Communication: Difficulty communicating thoughts/reduced clarity of speech (speaks at low volume)   Cognition Arousal: Lethargic Behavior During Therapy: WFL for tasks assessed/performed Overall Cognitive Status: No family/caregiver present to determine baseline cognitive functioning                                 General Comments: Pt requires excessive stimulation to wake up & engage in conversation with PT, pt oriented to location, situation, self. Decreased ability to follow simple commands throughout session.        General Comments      Exercises General Exercises - Lower Extremity Ankle Circles/Pumps: Supine, AROM, Both, 5 reps Heel Slides: PROM, AAROM, Supine, Strengthening, Both, 5 reps, 10 reps   Assessment/Plan    PT Assessment Patient needs continued PT services  PT Problem List Decreased strength;Decreased mobility;Decreased range of motion;Decreased coordination;Decreased knowledge of precautions;Decreased activity tolerance;Decreased skin integrity;Decreased balance;Decreased knowledge of use of DME;Pain;Decreased cognition;Decreased safety awareness       PT Treatment Interventions DME instruction;Therapeutic exercise;Gait training;Balance training;Stair training;Neuromuscular re-education;Functional mobility training;Therapeutic activities;Patient/family education;Manual techniques;Wheelchair mobility training;Modalities    PT Goals (Current goals can be found in the Care Plan section)  Acute Rehab PT Goals Patient Stated Goal: none stated PT Goal Formulation: With patient Time For Goal Achievement: 03/25/23 Potential to Achieve Goals: Fair    Frequency Min 1X/week     Co-evaluation               AM-PAC PT "6 Clicks" Mobility  Outcome Measure Help needed turning from your back to your side while in a flat bed without using bedrails?: A Lot Help needed moving from lying on your back to sitting on the side of a flat bed without using bedrails?: Total Help needed moving to and from a bed to a chair (including a wheelchair)?: Total Help  needed standing up from a chair using your arms (e.g., wheelchair or bedside chair)?: Total Help needed to walk in hospital room?: Total Help needed climbing 3-5 steps with a railing? : Total 6 Click Score: 7    End of Session   Activity Tolerance: Patient limited by fatigue Patient left: in bed;with call bell/phone within reach;with bed alarm set;with SCD's reapplied   PT Visit Diagnosis: Muscle weakness (generalized) (M62.81);History of falling (Z91.81);Difficulty in walking, not elsewhere classified (R26.2);Pain;Other abnormalities of gait and mobility (R26.89) Pain - Right/Left:  (bilateral) Pain - part of body: Hip    Time: 1030-1040 PT Time Calculation (min) (ACUTE ONLY): 10 min   Charges:   PT Evaluation $PT Eval Low Complexity: 1 Low   PT General Charges $$ ACUTE PT VISIT: 1 Visit         Aleda Grana, PT, DPT 03/11/23, 10:51 AM   Sandi Mariscal 03/11/2023, 10:50 AM

## 2023-03-11 NOTE — Plan of Care (Signed)

## 2023-03-11 NOTE — Consult Note (Signed)
Consultation Note Date: 03/11/2023   Patient Name: Aaron Short  DOB: [birth year]  MRN: [id number]  Age / Sex: 77 y.o., male  PCP: Rosemarie Ax, MD Referring Physician: Kirstie Peri, MD  Reason for Consultation: Establishing goals of care   HPI/Brief Hospital Course: 77 y.o. male  with past medical history of atrial fibrillation on Xarelto, HTN, T2DM and esophageal adenocarcinoma (not currently receiving treatments) admitted from home on 03/09/2023 with recurrent falls.   Complaining of left hip pain Imaging revealed acute comminuted nondisplaced fracture of the left greater trochanter extending to the femoral prosthesis  Noted recent admit 10/4-10/6 s/p mechanical fall found to have right mildly displaced proximal femoral periprosthetic fracture  Chemotherapy has been placed on hold due significant functional decline, weight loss and now with bilateral hip fractures.  Palliative medicine was consulted for assisting with goals of care conversations.  Subjective:  Extensive chart review has been completed prior to meeting patient including labs, vital signs, imaging, progress notes, orders, and available advanced directive documents from current and previous encounters.  Visited with Aaron Short at his bedside this AM, he is resting, PT joins room and attempts to engage Aaron Short in PT exercises. He is able to answer simple orientation questions, drowsy and drifts back to sleep without constant redirection. Able to minimally participate with PT bed exercises. No family at bedside during time of visit.  Called and spoke with wife-[name]. Plan set to meet at bedside this afternoon.  Returned to bedside. Aaron Short much more awake and alert compared to AM assessment, able to engage in conversations.  Introduced myself as a Publishing rights manager as a member of the palliative care team. Explained palliative medicine is  specialized medical care for people living with serious illness. It focuses on providing relief from the symptoms and stress of a serious illness. The goal is to improve quality of life for both the patient and the family.   Both Aaron Short and [facility] familiar with Palliative Care as they recently had a clinic visit with Aaron Munroe, NP.  Aaron Short shares that she and Aaron Short have been married for over 30 years. They do not have children together but Aaron Short has a son-Aaron Short from a previous marriage who lives in [place], [place]. They lives for many years in [place] as well and moved to [place] around 2010. 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  They collectively share their understanding of current and recent medical condition and complications. Chemotherapy was stopped due to functional decline as well as side effects and FTT. Medications were added and adjusted to help with this including Marinol, Aaron Short worked diligently with Mr. Shulman at home in attempting to improve appetite and improve his functional status. They both agree this was not successful, he instead had multiple falls now with recurrent falls resulting in bilateral hip fractures.  Aaron Short shares his pain is currently well controlled, appetite remains unchanged and reports sleeping well at night.  Attempted to elicit goals of care. Aaron Short shares with me a copy of completed Advanced Directives-uploaded to EMR. He has appointed Aaron Short his wife as Product manager and his sister in Social worker Samara Deist as second HCPOA. His Living Will indicates he would not desire long term life preserving measures. Confirmed these remain his current wishes. We also discussed his code status and addressed the difference between Full Code and Do Not Resuscitate. Aaron Short shares he has already established as DNR and provided golden form  on arrival to hospital. Confirmed with Aaron Short his current wishes remain for  DNR and he would never be accepting of mechanical ventilation. Order updated to reflect DNR/DNI, will review chart and upload DNR to [facility] if available, if not will issue new DNR form.  We further discussed the difference between continuing with current plan of care versus shifting our focus to providing comfort care and consideration of having hospice services involved. Aaron Short shares he and Aaron Short have had recent conversations around this. He shares he feels he is nearing the time where he would be interested in seeking comfort/hospice care. He has had several family members utilize hospice services and was pleased with the care they received. He would like to take time to further discuss this with Aaron Short as well as his son Aaron Short. Assured them both decision is not urgent and to take the time they need for decision making.  Assured [name] and Aaron Short that a member from PMT would follow up throughout his hospital stay.  I discussed importance of continued conversations with family/support persons and all members of their medical team regarding overall plan of care and treatment options ensuring decisions are in alignment with patients goals of care.  All questions/concerns addressed. PMT will continue to follow and support patient as needed.  Objective: Primary Diagnoses: Present on Admission:  Closed left hip fracture, initial encounter (HCC)  Atrial fibrillation and flutter (HCC)  Hypertension, benign  Chronic anemia   Physical Exam Constitutional:      General: He is not in acute distress.    Appearance: He is cachectic. He is ill-appearing.  Pulmonary:     Effort: Pulmonary effort is normal. No respiratory distress.  Skin:    General: Skin is warm and dry.  Neurological:     Mental Status: He is alert and oriented to person, place, and time.     Motor: Weakness present.  Psychiatric:        Mood and Affect: Mood normal.        Thought Content: Thought content normal.      Vital Signs: BP 109/61 (BP Location: Right Arm)   Pulse 100   Temp 97.7 F (36.5 C)   Resp 16   SpO2 94%  Pain Scale: 0-10 POSS *See Group Information*: 2-Acceptable,Slightly drowsy, easily aroused Pain Score: 6   IO: Intake/output summary:  Intake/Output Summary (Last 24 hours) at 03/11/2023 1430 Last data filed at 03/11/2023 1407 Gross per 24 hour  Intake 2179.33 ml  Output 350 ml  Net 1829.33 ml    LBM: Last BM Date : 03/10/23 Baseline Weight:   Most recent weight:         Palliative Assessment/Data: 40%   Assessment and Plan  SUMMARY OF RECOMMENDATIONS   DNR/DNI Ongoing GOC conversations  Palliative Prophylaxis:   Bowel Regimen, Delirium Protocol and Frequent Pain Assessment   Thank you for this consult and allowing Palliative Medicine to participate in the care of Kyen M. Pellman. Palliative medicine will continue to follow and assist as needed.   Time Total: 75 minutes  Time spent includes: Detailed review of medical records (labs, imaging, vital signs), medically appropriate exam (mental status, respiratory, cardiac, skin), discussed with treatment team, counseling and educating patient, family and staff, documenting clinical information, medication management and coordination of care.   Signed by: Leeanne Deed, DNP, AGNP-C Palliative Medicine    Please contact Palliative Medicine Team phone at [phone] for questions and concerns.  For individual provider: See Loretha Stapler

## 2023-03-11 NOTE — Progress Notes (Signed)
ORTHOPEDIC PROGRESS NOTE  SUBJECTIVE:     The patient is resting quietly in bed.  OBJECTIVE:  Vitals:   03/10/23 2316 03/11/23 0804  BP: (!) 102/57 109/61  Pulse: 84 100  Resp: 16 16  Temp: 97.8 F (36.6 C) 97.7 F (36.5 C)  SpO2: 97% 94%    The patient's bilateral lower extremities are neurovascularly intact.  Their toes are pink and warm with brisk capillary refill time.  Dorsalis pedis and posterior tibial pulse are 2+ and symmetric. The patient's calves are soft, nontender, with a negative Homans test bilaterally.  Bilateral hip exam stable and unchanged.  Labs:  Recent Labs    03/09/23 1220 03/10/23 0457  HGB 9.2* 8.0*   Recent Labs    03/09/23 1220 03/10/23 0457  WBC 11.8* 9.6  RBC 2.67* 2.28*  HCT 28.8* 24.4*  PLT 337 286   Recent Labs    03/09/23 1220 03/10/23 0457  NA 137 137  K 3.8 3.8  CL 97* 100  CO2 26 26  BUN 18 12  CREATININE 0.61 0.41*  GLUCOSE 135* 87  CALCIUM 8.7* 8.2*   No results for input(s): "LABPT", "INR" in the last 72 hours.    ASSESSMENT:  Assessment: 77 year old male status post left and right total hip arthroplasties with recent right periprosthetic greater trochanteric fracture radiograph 02-28-2023 and new left proximal anterior femur periprosthetic femur fracture around the total hip arthroplasty.    The patient's orthopedic condition is stable and they continue to improve daily.   PLAN:  Discharge planning for subacute rehab placement/skilled nursing facility.   Cecil Cranker M.D. 03/11/2023 11:38 AM

## 2023-03-12 ENCOUNTER — Encounter: Payer: Self-pay | Admitting: Oncology

## 2023-03-12 DIAGNOSIS — W19XXXA Unspecified fall, initial encounter: Secondary | ICD-10-CM | POA: Diagnosis not present

## 2023-03-12 DIAGNOSIS — L8990 Pressure ulcer of unspecified site, unspecified stage: Secondary | ICD-10-CM | POA: Diagnosis present

## 2023-03-12 DIAGNOSIS — E860 Dehydration: Secondary | ICD-10-CM | POA: Diagnosis not present

## 2023-03-12 DIAGNOSIS — S72002A Fracture of unspecified part of neck of left femur, initial encounter for closed fracture: Secondary | ICD-10-CM | POA: Diagnosis not present

## 2023-03-12 DIAGNOSIS — E43 Unspecified severe protein-calorie malnutrition: Secondary | ICD-10-CM | POA: Diagnosis not present

## 2023-03-12 DIAGNOSIS — Y92009 Unspecified place in unspecified non-institutional (private) residence as the place of occurrence of the external cause: Secondary | ICD-10-CM

## 2023-03-12 LAB — CBC
HCT: 28 % — ABNORMAL LOW (ref 39.0–52.0)
Hemoglobin: 9.4 g/dL — ABNORMAL LOW (ref 13.0–17.0)
MCH: 35.1 pg — ABNORMAL HIGH (ref 26.0–34.0)
MCHC: 33.6 g/dL (ref 30.0–36.0)
MCV: 104.5 fL — ABNORMAL HIGH (ref 80.0–100.0)
Platelets: 371 10*3/uL (ref 150–400)
RBC: 2.68 MIL/uL — ABNORMAL LOW (ref 4.22–5.81)
RDW: 16.7 % — ABNORMAL HIGH (ref 11.5–15.5)
WBC: 14.5 10*3/uL — ABNORMAL HIGH (ref 4.0–10.5)
nRBC: 0 % (ref 0.0–0.2)

## 2023-03-12 LAB — COMPREHENSIVE METABOLIC PANEL
ALT: 20 U/L (ref 0–44)
AST: 56 U/L — ABNORMAL HIGH (ref 15–41)
Albumin: 2.3 g/dL — ABNORMAL LOW (ref 3.5–5.0)
Alkaline Phosphatase: 187 U/L — ABNORMAL HIGH (ref 38–126)
Anion gap: 11 (ref 5–15)
BUN: 11 mg/dL (ref 8–23)
CO2: 26 mmol/L (ref 22–32)
Calcium: 8.4 mg/dL — ABNORMAL LOW (ref 8.9–10.3)
Chloride: 97 mmol/L — ABNORMAL LOW (ref 98–111)
Creatinine, Ser: 0.53 mg/dL — ABNORMAL LOW (ref 0.61–1.24)
GFR, Estimated: >60 mL/min (ref >60)
Glucose, Bld: 93 mg/dL (ref 70–99)
Potassium: 4 mmol/L (ref 3.5–5.1)
Sodium: 134 mmol/L — ABNORMAL LOW (ref 135–145)
Total Bilirubin: 0.6 mg/dL (ref 0.3–1.2)
Total Protein: 5.5 g/dL — ABNORMAL LOW (ref 6.5–8.1)

## 2023-03-12 MED ORDER — PROCHLORPERAZINE MALEATE 10 MG PO TABS
10.0000 mg | ORAL_TABLET | Freq: Four times a day (QID) | ORAL | Status: DC | PRN
  Administered 2023-03-12 – 2023-03-21 (×7): 10 mg via ORAL
  Filled 2023-03-12 (×8): qty 1

## 2023-03-12 MED ORDER — MEDIHONEY WOUND/BURN DRESSING EX PSTE
1.0000 | PASTE | Freq: Every day | CUTANEOUS | Status: DC
  Administered 2023-03-12 – 2023-03-21 (×9): 1 via TOPICAL
  Filled 2023-03-12: qty 44

## 2023-03-12 NOTE — Progress Notes (Signed)
Progress Note Patient: Aaron Short WGN:[id number] DOB: Sep 11, 1945 DOA: 03/09/2023     3 DOS: the patient was seen and examined on 03/12/2023   Brief hospital course: 77 y.o. male PMH of HTN, esophageal cancer, h/o pelvic and hip fracture, chronic pain syndrome, HLD, bladder cancer, smoker, Afib, vertebral fractures, DDD.   They presented to the local emergency department on 03/09/2023 after ground-level fall at home.   Orthopedics was consulted and based on the fracture pattern and the patient's overall medical condition nonoperative treatment was recommended.  Since admission, patient has been stable and evaluated by PT/OT who recommended SNF.  Family and patient are discussing goals and preferences for discharge at this time. He is considering comfort/hospice options. Appreciate palliative input.   Assessment and Plan: Fall at home, initial encounter  acute closed left hip fracture  prior existing R hip fracture:  Attribute to combination of factors including low blood pressure, medications, deconditioning, recent right hip fracture. - Currently fall precautions - PT/OT - analgesia PRN - conservative management, per ortho  Chronic anemia : stable hgb 9.4 today -Type and screen.  IV PPI.  Hypertension, benign :   Patient is on midodrine for hypotension will continue with the same.  Atrial fibrillation and flutter  EKG changes A-fib with PVCs, low voltage, QTc of 395, nonspecific ST-T wave changes. Continue Xarelto.   Severe calorie deficit malnutrition- contributing to falls and fractures. Poor PO intake due to low appetite, chronic nausea, dysphagia from complex history including esophageal cancer. Has worsened over past 3 months and had significant weight loss. Approached idea of possible PEG tube and he and son were appreciative of the idea but when later spoke with GI, he and wife declined the option.  - continue nutritional supplements - antiemetics PRN       Subjective: patient feels nauseous today. He has not eaten much in days. Denies pain in hip while resting. He and son are interested in hearing more about PEG tube feedings.   Physical Exam: Vitals:   03/10/23 2316 03/11/23 0804 03/11/23 1546 03/12/23 0016  BP: (!) 102/57 109/61 106/73 113/65  Pulse: 84 100 99 98  Resp: 16 16 16 16   Temp: 97.8 F (36.6 C) 97.7 F (36.5 C) 97.8 F (36.6 C) 97.8 F (36.6 C)  TempSrc: Oral  Oral   SpO2: 97% 94% 97% 97%    Physical Exam Constitutional:      General: He is not in acute distress.    Appearance: He is ill-appearing.     Comments: Frail looking pale man not in acute distress.  HENT:     Head: Normocephalic and atraumatic.  Cardiovascular:     Rate and Rhythm: Normal rate and regular rhythm.  Pulmonary:     Effort: Pulmonary effort is normal.  Abdominal:  scaphoid Musculoskeletal:        General: mild tenderness with palpation and trace edema of left hip Neurological:     General: No focal deficit present.     Mental Status: He is oriented to person, place, and time.  Psychiatric:        Mood and Affect: Mood normal.  Data Reviewed:    Latest Ref Rng & Units 03/12/2023    5:26 AM 03/10/2023    4:57 AM 03/09/2023   12:20 PM  CBC  WBC 4.0 - 10.5 K/uL 14.5  9.6  11.8   Hemoglobin 13.0 - 17.0 g/dL 9.4  8.0  9.2   Hematocrit 39.0 - 52.0 % 28.0  24.4  28.8   Platelets 150 - 400 K/uL 371  286  337       Latest Ref Rng & Units 03/12/2023    5:26 AM 03/10/2023    4:57 AM 03/09/2023   12:20 PM  BMP  Glucose 70 - 99 mg/dL 93  87  161   BUN 8 - 23 mg/dL 11  12  18    Creatinine 0.61 - 1.24 mg/dL 0.96  0.45  4.09   Sodium 135 - 145 mmol/L 134  137  137   Potassium 3.5 - 5.1 mmol/L 4.0  3.8  3.8   Chloride 98 - 111 mmol/L 97  100  97   CO2 22 - 32 mmol/L 26  26  26    Calcium 8.9 - 10.3 mg/dL 8.4  8.2  8.7    Family Communication: Son at bedside  Disposition: Status is: Inpatient Remains inpatient appropriate because:  Fall and fracture  Planned Discharge Destination:  TBD likely SNF    Time spent: 38 minutes  Author: Leeroy Bock, MD 03/12/2023 7:30 AM  For on call review [email or web address].

## 2023-03-12 NOTE — Plan of Care (Signed)

## 2023-03-12 NOTE — Progress Notes (Signed)
Physical Therapy Treatment Patient Details Name: Aaron Short MRN: [id number] DOB: 06/09/1945 Today's Date: 03/12/2023   History of Present Illness Pt is a 77 y/o M admitted on 03/09/23 after presenting with c/o a fall. Pt also noted to have a fall on 02/28/23 as well. Pt with very minimally displaced R greater trochanter periprosthetic fx around R THA and now with L proximal anterior femur periprosthetic femur fx around the THA. Orthopedics recommending conservative management. PMH: CA, DM, a-fib, HTN    PT Comments  Pt received in supine position and agreeable to therapy.  Pt notes 6/10 pain upon arrival to the room, but in much better spirits and notes he is feeling a lot better.  Pt understanding of current weight bearing restrictions and is ready to attempt to perform bed-level exercises.  Pt put forth great effort throughout the session and was able to perform all the exercises listed below.  Pt did require some AAROM with hip abduction only due to pillow placed in bed making it more difficult to move the L hip into abduction.  Pt educated on use of hoyer lift to get to chair.  Pt declined at this time however and was left in bed with all needs met and call bell within reach.  Son in room as well and was given HEP to perform in between bouts of therapy.       If plan is discharge home, recommend the following: Direct supervision/assist for financial management;Help with stairs or ramp for entrance;Direct supervision/assist for medications management;Two people to help with walking and/or transfers;Two people to help with bathing/dressing/bathroom;Assistance with cooking/housework;Assistance with feeding   Can travel by private vehicle     No  Equipment Recommendations  BSC/3in1;Hospital bed;Wheelchair (measurements PT);Wheelchair cushion (measurements PT);Hoyer lift    Recommendations for Other Services       Precautions / Restrictions Precautions Precautions:  Fall Restrictions Weight Bearing Restrictions: Yes RLE Weight Bearing: Non weight bearing LLE Weight Bearing: Non weight bearing Other Position/Activity Restrictions: Per Dr. Rosann Auerbach, previously ordered R hip abduction brace is not necessary, gentle PROM okay.     Mobility  Bed Mobility               General bed mobility comments: deferred and performed LE bed-level exercises to strengthen the LE's and abide by weight-bearing precautions.    Transfers                        Ambulation/Gait                   Stairs             Wheelchair Mobility     Tilt Bed    Modified Rankin (Stroke Patients Only)       Balance Overall balance assessment: Needs assistance, History of Falls                                          Cognition Arousal: Alert Behavior During Therapy: WFL for tasks assessed/performed Overall Cognitive Status: Within Functional Limits for tasks assessed                                          Exercises Total Joint Exercises Ankle Circles/Pumps: AROM, Strengthening, Both, 10  reps, Supine Quad Sets: AROM, Strengthening, Both, 10 reps, Supine Gluteal Sets: AROM, Strengthening, Both, 10 reps, Supine Heel Slides: AROM, Strengthening, Both, 10 reps, Supine Hip ABduction/ADduction: AROM, Strengthening, Both, 10 reps, Supine Straight Leg Raises: AROM, Strengthening, Both, 10 reps, Supine    General Comments        Pertinent Vitals/Pain Pain Assessment Pain Assessment: 0-10 Pain Score: 6  Pain Location: BLE hips (L appears more painful than R) Pain Descriptors / Indicators: Aching Pain Intervention(s): Limited activity within patient's tolerance, Monitored during session, Repositioned    Home Living                          Prior Function            PT Goals (current goals can now be found in the care plan section) Acute Rehab PT Goals Patient Stated Goal: to get  stronger PT Goal Formulation: With patient Time For Goal Achievement: 03/25/23 Potential to Achieve Goals: Fair Progress towards PT goals: Goals updated    Frequency    Min 1X/week      PT Plan      Co-evaluation              AM-PAC PT "6 Clicks" Mobility   Outcome Measure  Help needed turning from your back to your side while in a flat bed without using bedrails?: A Lot Help needed moving from lying on your back to sitting on the side of a flat bed without using bedrails?: A Lot Help needed moving to and from a bed to a chair (including a wheelchair)?: Total Help needed standing up from a chair using your arms (e.g., wheelchair or bedside chair)?: Total Help needed to walk in hospital room?: Total Help needed climbing 3-5 steps with a railing? : Total 6 Click Score: 8    End of Session   Activity Tolerance: Treatment limited secondary to medical complications (Comment) Patient left: in bed;with call bell/phone within reach;with bed alarm set;with SCD's reapplied Nurse Communication: Mobility status PT Visit Diagnosis: Muscle weakness (generalized) (M62.81);History of falling (Z91.81);Difficulty in walking, not elsewhere classified (R26.2);Pain;Other abnormalities of gait and mobility (R26.89) Pain - Right/Left:  (bilateral) Pain - part of body: Hip     Time: 1021-1033 PT Time Calculation (min) (ACUTE ONLY): 12 min  Charges:    $Therapeutic Exercise: 8-22 mins PT General Charges $$ ACUTE PT VISIT: 1 Visit                     Nolon Bussing, PT, DPT Physical Therapist - [facility]  03/12/23, 11:59 AM

## 2023-03-12 NOTE — Consult Note (Signed)
WOC Nurse Consult Note: patient admitted for falls; has multiple skin tears to arms in various stages of healing from previous falls  Reason for Consult: sacrum/bilateral upper extremity wounds  Wound type: 1.  Stage 3 Pressure Injury Coccyx x 2 2.  Healing full thickness skin loss to L upper arm x 2 separate areas; full thickness L elbow; full thickness R elbow 3.  Stage 1 L medial heel  Pressure Injury POA: Yes Measurement: 1.  Stage 3 PI coccyx superior area 0.5 x 0.5 cm 100% tan slough; distal to this area 0.3 cm x 0.3 cm 50% pink moist 50% yellow slough  2.  Full thickness L upper arm 2 separate areas that are both 7 cm x 3 cm healing 100% pink and moist (patients wife is a retired Engineer, civil (consulting) and has been taking care of this area for 2 months)  3.  L elbow full thickness 2 cm x 1 cm x 0.1 cm  100% pink moist; R elbow 0.5 cm x 0.5 cm x 0.1 cm 100% pink moist  4.  Stage 1 L medial heel 1 cm x 1 cm  Wound bed: as above  Drainage (amount, consistency, odor) minimal tan exudate coccyx; serosanguinous to B elbows  Periwound: erythema to coccyx/sacrum; scattered ecchymosis noted to bilateral arms  Dressing procedure/placement/frequency:  Clean L upper arm, L elbow and R elbow wounds with NS, apply Xeroform gauze to wound beds every other day, cover with Telfa non-stick pad and either silicone foam or Kerlix roll gauze to secure.  Clean coccyx ulcers with NS, apply Medihoney to wound beds daily, cover with dry gauze.  Coat surrounding skin with floor stock moisture barrier.  Cover with silicone foam or ABD pad whichever is preferred.  Stage 1 L heel cover with silicone foam heel wrap and keep elevated on pillow with heel free floating.  Patient is on low air loss mattress at this time which will help reduce pressure.   POC discussed with bedside nurse, patient and son. WOC team will not follow at this time. Re-consult if further needs arise.   Thank you,     Priscella Mann MSN, RN-BC,  [employer] [phone]

## 2023-03-12 NOTE — Care Management Important Message (Signed)
Important Message  Patient Details  Name: Aaron Short MRN: [id number] Date of Birth: 04/25/1946   Important Message Given:  N/A - LOS <3 / Initial given by admissions     [name] 03/12/2023, 9:34 AM

## 2023-03-12 NOTE — Progress Notes (Signed)
Presents to see this patient for possible PEG tube placement.  I walked into the patient's room and reduce myself.  The patient's wife asked if I was there to speak to the patient about having a PEG tube placed.  I answered in the affirmative.  The patient's wife, who is a retired Engineer, civil (consulting), stated that she had spoken to her husband and both her husband and her verbalized to me that they do not want to entertain placing a feeding tube at this time.  I contacted the hospitalist with the particulars of our conversation. Please let me know if the patient changes his mind.  Midge Minium, MD [facility]

## 2023-03-12 NOTE — Progress Notes (Signed)
ORTHOPEDIC PROGRESS NOTE  SUBJECTIVE:     The patient has a normal mood and affect. The patient reports their pain is controlled with medications.  Patient is currently doing straight leg raises with physical therapy on both lower extremities which is greatly improved since admission.  OBJECTIVE:  Vitals:   03/12/23 0016 03/12/23 0840  BP: 113/65 113/70  Pulse: 98 100  Resp: 16 16  Temp: 97.8 F (36.6 C) 98.1 F (36.7 C)  SpO2: 97% 95%    The patient's bilateral lower extremities are neurovascularly intact.  Their toes are pink and warm with brisk capillary refill time.  Dorsalis pedis and posterior tibial pulse are 2+ and symmetric. The patient's calves are soft, nontender, with a negative Homans test bilaterally. The patient is making slow progress with physical therapy as expected based on their overall medical condition.  The patient's bilateral hip exams shows slightly improved range of motion with less discomfort.  Labs:  Recent Labs    03/10/23 0457 03/12/23 0526  HGB 8.0* 9.4*   Recent Labs    03/10/23 0457 03/12/23 0526  WBC 9.6 14.5*  RBC 2.28* 2.68*  HCT 24.4* 28.0*  PLT 286 371   Recent Labs    03/10/23 0457 03/12/23 0526  NA 137 134*  K 3.8 4.0  CL 100 97*  CO2 26 26  BUN 12 11  CREATININE 0.41* 0.53*  GLUCOSE 87 93  CALCIUM 8.2* 8.4*   No results for input(s): "LABPT", "INR" in the last 72 hours.    ASSESSMENT:  * No surgery found *  Status Post: * Cannot find OR case *   The patient's orthopedic condition is stable and they continue to improve daily. The patient is stable and can be discharged from the orthopedic standpoint.   PLAN:  Continue PT to mobilize patient as tolerated. Follow-up in the office in 10-14 days.  The patient may contact the [facility] at [phone]  to schedule their follow-up appointment.   Cecil Cranker M.D. 03/12/2023 12:27 PM

## 2023-03-12 NOTE — Plan of Care (Signed)

## 2023-03-13 ENCOUNTER — Other Ambulatory Visit: Payer: Self-pay | Admitting: Oncology

## 2023-03-13 DIAGNOSIS — S72002A Fracture of unspecified part of neck of left femur, initial encounter for closed fracture: Secondary | ICD-10-CM | POA: Diagnosis not present

## 2023-03-13 DIAGNOSIS — Y92009 Unspecified place in unspecified non-institutional (private) residence as the place of occurrence of the external cause: Secondary | ICD-10-CM | POA: Diagnosis not present

## 2023-03-13 DIAGNOSIS — W19XXXA Unspecified fall, initial encounter: Secondary | ICD-10-CM | POA: Diagnosis not present

## 2023-03-13 DIAGNOSIS — E860 Dehydration: Secondary | ICD-10-CM | POA: Diagnosis not present

## 2023-03-13 MED ORDER — ADULT MULTIVITAMIN W/MINERALS CH
1.0000 | ORAL_TABLET | Freq: Every day | ORAL | Status: DC
  Administered 2023-03-13 – 2023-03-21 (×9): 1 via ORAL
  Filled 2023-03-13 (×10): qty 1

## 2023-03-13 MED ORDER — BOOST / RESOURCE BREEZE PO LIQD CUSTOM
1.0000 | Freq: Three times a day (TID) | ORAL | Status: DC
  Administered 2023-03-13 – 2023-03-20 (×5): 1 via ORAL

## 2023-03-13 MED FILL — Dexamethasone Sodium Phosphate Inj 100 MG/10ML: INTRAMUSCULAR | Qty: 1 | Status: AC

## 2023-03-13 NOTE — Plan of Care (Signed)

## 2023-03-13 NOTE — Progress Notes (Signed)
Initial Nutrition Assessment  DOCUMENTATION CODES:   Not applicable  INTERVENTION:   -D/c Ensure Enlive po BID, each supplement provides 350 kcal and 20 grams of protein -Boost Breeze po TID, each supplement provides 250 kcal and 9 grams of protein  -MVI with minerals daily -Magic cup TID with meals, each supplement provides 290 kcal and 9 grams of protein  -Liberalize diet to regular for widest variety of meal selections   NUTRITION DIAGNOSIS:   Increased nutrient needs related to wound healing as evidenced by estimated needs.  GOAL:   Patient will meet greater than or equal to 90% of their needs  MONITOR:   PO intake, Supplement acceptance  REASON FOR ASSESSMENT:   Malnutrition Screening Tool    ASSESSMENT:   Pt with PMH of HTN, esophageal cancer, h/o pelvic and hip fracture, chronic pain syndrome, HLD, bladder cancer, smoker, Afib, vertebral fractures, DDD admitted with lt hip fracture s/p fall.  Pt admitted with acute closed lt hip fracture s/p fall.   Reviewed I/O's: +50 ml x 24 hours and +2.4 L since admission  UOP: 200 ml x 24 hours  Pt unavailable at time of visit. Attempted to speak with pt via call to hospital room phone, however, unable to reach. RD unable to obtain further nutrition-related history or complete nutrition-focused physical exam at this time.    Per orthopedics notes, no plan for surgical intervention.   Pt with variable oral intake. Noted meal completions 0-100%. Pt is refusing Ensure supplements. Per GI notes, pt is refusing feeding tube placement.   Reviewed wt hx; pt has experienced a 4% wt loss over the past month, which is not significant for time frame.   Pt is at high risk for malnutrition, however, unable to identify at this time.   Palliative care following for goals of care; pt and family leaning towards comfort care.   Medications reviewed and include marinol, protonix, and sodium chloride.   Labs reviewed: Na: 134 (inpatient  orders for glycemic control are none).    Diet Order:   Diet Order             Diet regular Room service appropriate? Yes; Fluid consistency: Thin  Diet effective now                   EDUCATION NEEDS:   No education needs have been identified at this time  Skin:  Skin Assessment: Skin Integrity Issues: Skin Integrity Issues:: Stage II, Other (Comment) Stage II: sacrum Other: rt elbow skin tear, lt armn skin tear  Last BM:  03/12/23 (type 5)  Height:   Ht Readings from Last 1 Encounters:  03/02/23 5\' 8"  (1.727 m)    Weight:   Wt Readings from Last 1 Encounters:  03/02/23 55.8 kg    Ideal Body Weight:  70 kg  BMI:  There is no height or weight on file to calculate BMI.  Estimated Nutritional Needs:   Kcal:  1650-1850  Protein:  85-100 grams  Fluid:  > 1.6 L    Levada Schilling, RD, LDN, CDCES Registered Dietitian III Certified Diabetes Care and Education Specialist Please refer to [facility] for RD and/or RD on-call/weekend/after hours pager

## 2023-03-13 NOTE — Progress Notes (Signed)
Progress Note Patient: Aaron Short OZD:[id number] DOB: [birth year] DOA: 03/09/2023     4 DOS: the patient was seen and examined on 03/13/2023   Brief hospital course: 77 y.o. male PMH of HTN, esophageal cancer, h/o pelvic and hip fracture, chronic pain syndrome, HLD, bladder cancer, smoker, Afib, vertebral fractures, DDD.   They presented to the local emergency department on 03/09/2023 after ground-level fall at home.   Orthopedics was consulted and based on the fracture pattern and the patient's overall medical condition nonoperative treatment was recommended.  Since admission, patient has been stable and evaluated by PT/OT who recommended SNF.  Family and patient are discussing goals and preferences for discharge at this time. He is considering comfort/hospice options. Appreciate palliative input.   Assessment and Plan: Fall at home, initial encounter  acute closed left hip fracture  prior existing R hip fracture:  Attribute to combination of factors including low blood pressure, medications, deconditioning, recent right hip fracture. - Currently fall precautions - PT/OT - analgesia PRN - conservative management, per ortho  Chronic anemia : stable hgb 9.4 - PPI.  Hypertension, benign :   Patient is on midodrine for hypotension will continue with the same.  Atrial fibrillation and flutter  EKG changes A-fib with PVCs, low voltage, QTc of 395, nonspecific ST-T wave changes. Continue Xarelto.   Severe calorie deficit malnutrition- contributing to falls and fractures. Poor PO intake due to low appetite, chronic nausea, dysphagia from complex history including esophageal cancer. Has worsened over past 3 months and had significant weight loss. Approached idea of possible PEG tube and he and son were appreciative of the idea but when later spoke with GI, he and wife declined the option.  - continue nutritional supplements - antiemetics PRN      Subjective: patient feels  well today. Denies nausea or vomiting. His hip pain is well controlled. He, son, and wife are discussing what discharge plan they would like to pursue of home vs SNF. They asked to speak with palliative about more details.  Physical Exam: Vitals:   03/12/23 0016 03/12/23 0840 03/12/23 1454 03/12/23 2318  BP: 113/65 113/70 106/69 102/63  Pulse: 98 100 98 (!) 106  Resp: 16 16 16 15   Temp: 97.8 F (36.6 C) 98.1 F (36.7 C) (!) 97.5 F (36.4 C) 98.2 F (36.8 C)  TempSrc:  Oral Oral Oral  SpO2: 97% 95% 93% 92%    Physical Exam Constitutional:      General: He is not in acute distress. frail HENT:     Head: Normocephalic and atraumatic. Hoarse voice Cardiovascular:     Rate and Rhythm: Normal rate and regular rhythm.  Pulmonary:     Effort: Pulmonary effort is normal.  Abdominal:  scaphoid Musculoskeletal:        General: mild tenderness with palpation and trace edema of left hip Neurological:     General: No focal deficit present.     Mental Status: He is oriented to person, place, and time.  Psychiatric:        Mood and Affect: Mood normal.  Data Reviewed:    Latest Ref Rng & Units 03/12/2023    5:26 AM 03/10/2023    4:57 AM 03/09/2023   12:20 PM  CBC  WBC 4.0 - 10.5 K/uL 14.5  9.6  11.8   Hemoglobin 13.0 - 17.0 g/dL 9.4  8.0  9.2   Hematocrit 39.0 - 52.0 % 28.0  24.4  28.8   Platelets 150 - 400 K/uL 371  286  337       Latest Ref Rng & Units 03/12/2023    5:26 AM 03/10/2023    4:57 AM 03/09/2023   12:20 PM  BMP  Glucose 70 - 99 mg/dL 93  87  960   BUN 8 - 23 mg/dL 11  12  18    Creatinine 0.61 - 1.24 mg/dL 4.54  0.98  1.19   Sodium 135 - 145 mmol/L 134  137  137   Potassium 3.5 - 5.1 mmol/L 4.0  3.8  3.8   Chloride 98 - 111 mmol/L 97  100  97   CO2 22 - 32 mmol/L 26  26  26    Calcium 8.9 - 10.3 mg/dL 8.4  8.2  8.7    Family Communication: Son at bedside, wife on speaker phone during visit  Disposition: Status is: Inpatient Remains inpatient appropriate  because: Fall and fracture  Planned Discharge Destination:  TBD likely SNF    Time spent: 38 minutes  Author: Leeroy Bock, MD 03/13/2023 7:30 AM  For on call review [email or web address].

## 2023-03-13 NOTE — Progress Notes (Signed)
Physical Therapy Treatment Patient Details Name: Aaron Short MRN: [id number] DOB: Jan 26, 1946 Today's Date: 03/13/2023   History of Present Illness Pt is a 77 y/o M admitted on 03/09/23 after presenting with c/o a fall. Pt also noted to have a fall on 02/28/23 as well. Pt with very minimally displaced R greater trochanter periprosthetic fx around R THA and now with L proximal anterior femur periprosthetic femur fx around the THA. Orthopedics recommending conservative management. PMH: CA, DM, a-fib, HTN    PT Comments  Pt received in Semi-Fowler's position and agreeable to therapy.  Pt's son in room as well.  Pt notes he has been doing his exercises since yesterday, and is agreeable to perform them again during session.  Pt also introduced to UE exercises in order to improve overall strength of the UE's necessary for sliding board transfers that will be initiated soon.  Pt performed lat pull down with use of bed rails, but is unable to pull himself up in the bed at this time.  Pt still encouraged to perform the movement in order to strengthen the muscles so that way he will be able to assist in the future.  Pt given red theraband in order to provide resistance to the UE exercises as well.  Pt left with all needs met and call bell within reach.     If plan is discharge home, recommend the following: Direct supervision/assist for financial management;Help with stairs or ramp for entrance;Direct supervision/assist for medications management;Two people to help with walking and/or transfers;Two people to help with bathing/dressing/bathroom;Assistance with cooking/housework;Assistance with feeding   Can travel by private vehicle        Equipment Recommendations  BSC/3in1;Hospital bed;Wheelchair (measurements PT);Wheelchair cushion (measurements PT);Hoyer lift    Recommendations for Other Services       Precautions / Restrictions Precautions Precautions: Fall Restrictions Weight Bearing  Restrictions: Yes RLE Weight Bearing: Non weight bearing LLE Weight Bearing: Non weight bearing Other Position/Activity Restrictions: Per Dr. Rosann Auerbach, previously ordered R hip abduction brace is not necessary.     Mobility  Bed Mobility               General bed mobility comments: deferred and performed LE bed-level exercises to strengthen the LE's and abide by weight-bearing precautions.    Transfers                        Ambulation/Gait                   Stairs             Wheelchair Mobility     Tilt Bed    Modified Rankin (Stroke Patients Only)       Balance Overall balance assessment: Needs assistance, History of Falls             Standing balance comment: Pt has non-WBing precautions at present. Wife states that pt's balance "has been absolutely terrible" for several years.                            Cognition Arousal: Alert Behavior During Therapy: WFL for tasks assessed/performed Overall Cognitive Status: Within Functional Limits for tasks assessed  Exercises Total Joint Exercises Ankle Circles/Pumps: AROM, Strengthening, Both, 10 reps, Supine Quad Sets: AROM, Strengthening, Both, 10 reps, Supine Gluteal Sets: AROM, Strengthening, Both, 10 reps, Supine Heel Slides: AROM, Strengthening, Both, 10 reps, Supine Hip ABduction/ADduction: AROM, Strengthening, Both, 10 reps, Supine Straight Leg Raises: AROM, Strengthening, Both, 10 reps, Supine General Exercises - Upper Extremity Shoulder Flexion: AROM, Strengthening, Both, 10 reps, Supine (With use of red theraband for resistance.) Shoulder ABduction: AROM, Strengthening, Both, 10 reps, Supine (With use of red theraband for resistance.) Elbow Flexion: AROM, Strengthening, Both, 10 reps, Supine (With use of red theraband for resistance.)    General Comments        Pertinent Vitals/Pain Pain  Assessment Pain Assessment: Faces Faces Pain Scale: Hurts little more Pain Location: BLE hips (L appears more painful than R) Pain Descriptors / Indicators: Aching Pain Intervention(s): Limited activity within patient's tolerance, Monitored during session, Repositioned    Home Living                          Prior Function            PT Goals (current goals can now be found in the care plan section) Acute Rehab PT Goals Patient Stated Goal: to get stronger PT Goal Formulation: With patient Time For Goal Achievement: 03/25/23 Potential to Achieve Goals: Fair Progress towards PT goals: Progressing toward goals    Frequency    Min 1X/week      PT Plan      Co-evaluation              AM-PAC PT "6 Clicks" Mobility   Outcome Measure  Help needed turning from your back to your side while in a flat bed without using bedrails?: A Lot Help needed moving from lying on your back to sitting on the side of a flat bed without using bedrails?: A Lot Help needed moving to and from a bed to a chair (including a wheelchair)?: Total Help needed standing up from a chair using your arms (e.g., wheelchair or bedside chair)?: Total Help needed to walk in hospital room?: Total Help needed climbing 3-5 steps with a railing? : Total 6 Click Score: 8    End of Session   Activity Tolerance: Treatment limited secondary to medical complications (Comment) Patient left: in bed;with call bell/phone within reach;with bed alarm set;with SCD's reapplied Nurse Communication: Mobility status PT Visit Diagnosis: Muscle weakness (generalized) (M62.81);History of falling (Z91.81);Difficulty in walking, not elsewhere classified (R26.2);Pain;Other abnormalities of gait and mobility (R26.89) Pain - Right/Left:  (bilateral) Pain - part of body: Hip     Time: 1610-9604 PT Time Calculation (min) (ACUTE ONLY): 25 min  Charges:    $Therapeutic Exercise: 23-37 mins PT General Charges $$  ACUTE PT VISIT: 1 Visit                     Nolon Bussing, PT, DPT Physical Therapist - [facility]  03/13/23, 12:24 PM

## 2023-03-13 NOTE — Progress Notes (Signed)
Occupational Therapy Treatment Patient Details Name: Aaron Short MRN: [id number] DOB: 12-15-1945 Today's Date: 03/13/2023   History of present illness Pt is a 77 y/o M admitted on 03/09/23 after presenting with c/o a fall. Pt also noted to have a fall on 02/28/23 as well. Pt with very minimally displaced R greater trochanter periprosthetic fx around R THA and now with L proximal anterior femur periprosthetic femur fx around the THA. Orthopedics recommending conservative management. PMH: CA, DM, a-fib, HTN   OT comments  Chart reviewed prior to tx session. Pt seen for OT treatment on this date. Pt is alert and oriented throughout. Upon arrival to room pt awake in bed with wife present. Tx session targeted improved ADL activity tolerance. Wife reported PTA Pt was requiring assistance in all ADL/IADLs except for eating.  Pt requires MAX A for LB dressing. During bed mobility pt was able to initiate LE movement to EOB, ultimately requiring MAX A +2 with elevated HOB. Pt tolerated sitting on EOB ~ 2 mins with noted posterior lean. Nurse tech in tx checking vitals - vss, as well as nurse administering pain meds at the end of tx session. Pt adhering to NWB precautions. Pt making good progress toward goals, will continue to follow POC. Discharge recommendation remains appropriate. OT will follow acutely.           If plan is discharge home, recommend the following:  A lot of help with walking and/or transfers;A lot of help with bathing/dressing/bathroom;Assistance with feeding;Assistance with cooking/housework;Assist for transportation;A little help with bathing/dressing/bathroom;Direct supervision/assist for medications management   Equipment Recommendations  Hospital bed    Recommendations for Other Services      Precautions / Restrictions Precautions Precautions: Fall Precaution Comments: Per order from orthopedic surgeon [name] on 03/10/2023: "The patient is NWB on the  bilateral LE. May have transfer training bed to chair with walker, slide board with minimal weight bearing." Restrictions Weight Bearing Restrictions: Yes RLE Weight Bearing: Non weight bearing LLE Weight Bearing: Non weight bearing       Mobility Bed Mobility Overal bed mobility: Needs Assistance Bed Mobility: Supine to Sit, Sit to Supine     Supine to sit: HOB elevated, +2 for physical assistance, Max assist Sit to supine: HOB elevated, Max assist, +2 for physical assistance   General bed mobility comments: Pt initated bringing his LE to EOB    Transfers Overall transfer level: Needs assistance Equipment used: None                     Balance Overall balance assessment: Needs assistance, History of Falls Sitting-balance support: Feet supported, Bilateral upper extremity supported Sitting balance-Leahy Scale: Poor Sitting balance - Comments: Unsteady sitting on EOB; posterior lean noted ~ 2 min sitting       Standing balance comment: NT; Pt has non-WBing precautions at present. Wife states that pt's balance "has been absolutely terrible" for several years. Will continue to assess                           ADL either performed or assessed with clinical judgement   ADL Overall ADL's : Needs assistance/impaired                       Lower Body Dressing Details (indicate cue type and reason): SCDs MAX A               General ADL  Comments: Will continue to assess    Extremity/Trunk Assessment              Vision       Perception     Praxis      Cognition Arousal: Alert Behavior During Therapy: WFL for tasks assessed/performed Overall Cognitive Status: Within Functional Limits for tasks assessed                                          Exercises Other Exercises Other Exercises: Edu: home modifications recommendations, DME/AD suggestions for home d/c    Shoulder Instructions       General Comments       Pertinent Vitals/ Pain       Pain Assessment Pain Assessment: 0-10 Pain Score: 6  Pain Location: BLE hips Pain Descriptors / Indicators: Discomfort, Grimacing, Sore Pain Intervention(s): Limited activity within patient's tolerance, Monitored during session (Nurse in room giving meds at end of tx per wife request)  Home Living                                          Prior Functioning/Environment              Frequency  Min 1X/week        Progress Toward Goals  OT Goals(current goals can now be found in the care plan section)     Acute Rehab OT Goals Patient Stated Goal: to go home with family OT Goal Formulation: With patient/family Time For Goal Achievement: 03/24/23 Potential to Achieve Goals: Good  Plan      Co-evaluation                 AM-PAC OT "6 Clicks" Daily Activity     Outcome Measure   Help from another person eating meals?: A Little Help from another person taking care of personal grooming?: A Little Help from another person toileting, which includes using toliet, bedpan, or urinal?: A Lot Help from another person bathing (including washing, rinsing, drying)?: A Lot Help from another person to put on and taking off regular upper body clothing?: A Lot Help from another person to put on and taking off regular lower body clothing?: A Lot 6 Click Score: 14    End of Session    OT Visit Diagnosis: Unsteadiness on feet (R26.81);Repeated falls (R29.6);Muscle weakness (generalized) (M62.81);Other abnormalities of gait and mobility (R26.89)   Activity Tolerance Patient tolerated treatment well   Patient Left in bed;with family/visitor present;with call bell/phone within reach;with bed alarm set   Nurse Communication Mobility status (Nurse tech present to check vitals; vss)        Time: 1442-1510 OT Time Calculation (min): 28 min  Charges: OT General Charges $OT Visit: 1 Visit OT Treatments $Therapeutic Activity:  23-37 mins  [employer], OTS

## 2023-03-13 NOTE — Care Management Important Message (Signed)
Important Message  Patient Details  Name: Aaron Short MRN: [id number] Date of Birth: 03-17-1946   Important Message Given:  Yes - Medicare IM     [name] 03/13/2023, 11:31 AM

## 2023-03-13 NOTE — Plan of Care (Signed)

## 2023-03-14 ENCOUNTER — Inpatient Hospital Stay: Payer: Medicare Other

## 2023-03-14 ENCOUNTER — Ambulatory Visit: Payer: Medicare Other | Admitting: Oncology

## 2023-03-14 ENCOUNTER — Other Ambulatory Visit: Payer: Medicare Other

## 2023-03-14 ENCOUNTER — Inpatient Hospital Stay: Payer: Medicare Other | Admitting: Hospice and Palliative Medicine

## 2023-03-14 ENCOUNTER — Inpatient Hospital Stay: Payer: Medicare Other | Admitting: Oncology

## 2023-03-14 ENCOUNTER — Ambulatory Visit: Payer: Medicare Other

## 2023-03-14 DIAGNOSIS — W19XXXA Unspecified fall, initial encounter: Secondary | ICD-10-CM | POA: Diagnosis not present

## 2023-03-14 DIAGNOSIS — S72002A Fracture of unspecified part of neck of left femur, initial encounter for closed fracture: Secondary | ICD-10-CM | POA: Diagnosis not present

## 2023-03-14 DIAGNOSIS — Y92009 Unspecified place in unspecified non-institutional (private) residence as the place of occurrence of the external cause: Secondary | ICD-10-CM | POA: Diagnosis not present

## 2023-03-14 DIAGNOSIS — E43 Unspecified severe protein-calorie malnutrition: Secondary | ICD-10-CM | POA: Diagnosis not present

## 2023-03-14 MED ORDER — ONDANSETRON 8 MG PO TBDP
8.0000 mg | ORAL_TABLET | Freq: Three times a day (TID) | ORAL | Status: DC
  Administered 2023-03-14 – 2023-03-16 (×6): 8 mg via ORAL
  Filled 2023-03-14 (×8): qty 1

## 2023-03-14 NOTE — Progress Notes (Signed)
Physical Therapy Treatment Patient Details Name: Aaron Short MRN: [id number] DOB: 14-Dec-1945 Today's Date: 03/14/2023   History of Present Illness Pt is a 77 y/o M admitted on 03/09/23 after presenting with c/o a fall. Pt also noted to have a fall on 02/28/23 as well. Pt with very minimally displaced R greater trochanter periprosthetic fx around R THA and now with L proximal anterior femur periprosthetic femur fx around the THA. Orthopedics recommending conservative management. PMH: CA, DM, a-fib, HTN    PT Comments  Pt is seen by OT and PT for co-treatment to address functional mobility. Pt is received in bed with family (spouse and child) at bedside and is agreeable to therapy. At beginning of session, Pt endorses slight L hip/thigh discomfort. Pt performs bed mobility modA x2 and transfers maxA x1 for safety. VSS throughout session and WNL (see below for details). Pt able to transfer from bed>recliner using transfer board but requires frequent cuing for hand placement and technique. Spent extended time educating Pt and family on proper use of DME, set-up to prevent falls and optimize independence, and reinforcement of BLE NWB precautions. Overall, Pt demonstrates steady progression towards PT goals but continues to requires assist with sitting EOB due to decreased core strength. Pt would benefit from continues skilled PT to address above deficits and promote optimal return to PLOF.   If plan is discharge home, recommend the following: Direct supervision/assist for financial management;Help with stairs or ramp for entrance;Direct supervision/assist for medications management;Two people to help with walking and/or transfers;Two people to help with bathing/dressing/bathroom;Assistance with cooking/housework;Assistance with feeding   Can travel by private vehicle     No  Equipment Recommendations  BSC/3in1;Hospital bed;Wheelchair (measurements PT);Wheelchair cushion (measurements  PT);Hoyer lift    Recommendations for Other Services       Precautions / Restrictions Precautions Precautions: Fall Precaution Comments: Per order from orthopedic surgeon [name] on 03/10/2023: "The patient is NWB on the bilateral LE. May have transfer training bed to chair with walker, slide board with minimal weight bearing." Restrictions Weight Bearing Restrictions: Yes RLE Weight Bearing: Non weight bearing LLE Weight Bearing: Non weight bearing Other Position/Activity Restrictions: Per Dr. Rosann Auerbach, previously ordered R hip abduction brace is not necessary.     Mobility  Bed Mobility Overal bed mobility: Needs Assistance Bed Mobility: Supine to Sit     Supine to sit: HOB elevated, Used rails, Mod assist, +2 for physical assistance     General bed mobility comments: Pt able to initiate bed mobility but requires modA x2 for scooting towards EOB and trunk support    Transfers Overall transfer level: Needs assistance Equipment used: Sliding board Transfers: Bed to chair/wheelchair/BSC            Lateral/Scoot Transfers: Max assist General transfer comment: requires maxA x1 for lat scoot on sliding board; Pt able to initiate scooting on sliding board but demonstrates fatigue; cuing for adherence of BLE NWB during task    Ambulation/Gait               General Gait Details: Did not assess secondary to BLE NWB per MD   Stairs             Wheelchair Mobility     Tilt Bed    Modified Rankin (Stroke Patients Only)       Balance Overall balance assessment: Needs assistance, History of Falls Sitting-balance support: Feet supported, Bilateral upper extremity supported Sitting balance-Leahy Scale: Poor Sitting balance - Comments: Pt able to  maintain static seated EOB balance intermittent but at longest ~30 sec, requiring minA for maintain seated EOB balance Postural control: Posterior lean, Right lateral lean     Standing balance comment:  Unable to formally assess due to BLE NWB per MD                            Cognition Arousal: Alert Behavior During Therapy: WFL for tasks assessed/performed Overall Cognitive Status: Within Functional Limits for tasks assessed                                 General Comments: Pleasant and cooperative with PT/OT session        Exercises Other Exercises Other Exercises: Educated Pt and family on use of DME, benefits of exploring personalized PWC, and safety during stair navigation with Pt in wheelchair    General Comments General comments (skin integrity, edema, etc.): pre- BP 106/70 (81), Pulse 102, SpO2 95%; sitting EOB- BP 105/53 (68), Pulse 122, SpO2 95%; post- BP 108/69 (80), Pulse 123; brief report of dizziness during initial sitting EOB but improved with increased time      Pertinent Vitals/Pain Pain Assessment Pain Assessment: Faces Faces Pain Scale: Hurts little more Pain Location: L hip Pain Descriptors / Indicators: Discomfort, Sore Pain Intervention(s): Monitored during session    Home Living                          Prior Function            PT Goals (current goals can now be found in the care plan section) Acute Rehab PT Goals Patient Stated Goal: to get stronger PT Goal Formulation: With patient Time For Goal Achievement: 03/25/23 Potential to Achieve Goals: Fair Progress towards PT goals: Progressing toward goals    Frequency    Min 1X/week      PT Plan      Co-evaluation   Reason for Co-Treatment: Complexity of the patient's impairments (multi-system involvement);To address functional/ADL transfers;For patient/therapist safety PT goals addressed during session: Mobility/safety with mobility;Balance;Proper use of DME;Strengthening/ROM OT goals addressed during session: ADL's and self-care;Proper use of Adaptive equipment and DME;Strengthening/ROM      AM-PAC PT "6 Clicks" Mobility   Outcome Measure   Help needed turning from your back to your side while in a flat bed without using bedrails?: A Lot Help needed moving from lying on your back to sitting on the side of a flat bed without using bedrails?: A Lot Help needed moving to and from a bed to a chair (including a wheelchair)?: Total Help needed standing up from a chair using your arms (e.g., wheelchair or bedside chair)?: Total Help needed to walk in hospital room?: Total Help needed climbing 3-5 steps with a railing? : Total 6 Click Score: 8    End of Session   Activity Tolerance: Patient tolerated treatment well Patient left: in chair;with call bell/phone within reach;with chair alarm set;with family/visitor present Nurse Communication: Mobility status PT Visit Diagnosis: Muscle weakness (generalized) (M62.81);History of falling (Z91.81);Difficulty in walking, not elsewhere classified (R26.2);Pain;Other abnormalities of gait and mobility (R26.89) Pain - Right/Left: Left Pain - part of body: Hip     Time: 1610-9604 PT Time Calculation (min) (ACUTE ONLY): 28 min  Charges:  Elmon Else, SPT    Jazmyn Offner 03/14/2023, 3:17 PM

## 2023-03-14 NOTE — Plan of Care (Signed)

## 2023-03-14 NOTE — Plan of Care (Signed)

## 2023-03-14 NOTE — Progress Notes (Signed)
Attempted to perform dressing change. Family member requested that it be done at a later time.   Will attempt  to change dressing at later time per family request.

## 2023-03-14 NOTE — Progress Notes (Signed)
Progress Note Patient: Aaron Short ATF:[id number] DOB: June 18, 1945 DOA: 03/09/2023     5 DOS: the patient was seen and examined on 03/14/2023   Brief hospital course: 77 y.o. male PMH of HTN, esophageal cancer, h/o pelvic and hip fracture, chronic pain syndrome, HLD, bladder cancer, smoker, Afib, vertebral fractures, DDD.   They presented to the local emergency department on 03/09/2023 after ground-level fall at home.   Orthopedics was consulted and based on the fracture pattern and the patient's overall medical condition nonoperative treatment was recommended.  Since admission, patient has been stable and evaluated by PT/OT who recommended SNF.  Family and patient are discussing goals and preferences for discharge at this time. He is considering comfort/hospice options. Appreciate palliative input.   Assessment and Plan: Fall at home, initial encounter  acute closed left hip fracture  prior existing R hip fracture:  Attribute to combination of factors including low blood pressure, medications, deconditioning, recent right hip fracture. - Currently fall precautions - PT/OT - analgesia PRN - conservative management, per ortho  Chronic anemia : stable hgb 9.4 - PPI.  Hypertension, benign :   Patient is on midodrine for hypotension will continue with the same.  Atrial fibrillation and flutter  EKG changes A-fib with PVCs, low voltage, QTc of 395, nonspecific ST-T wave changes. Continue Xarelto.   Severe calorie deficit malnutrition- contributing to falls and fractures. Poor PO intake due to low appetite, chronic nausea, dysphagia from complex history including esophageal cancer. Has worsened over past 3 months and had significant weight loss. Approached idea of possible PEG tube and he and son were appreciative of the idea but when later spoke with GI, he and wife declined the option.  - continue nutritional supplements --trial scheduled Zofran ODT's      Subjective:  Pt seen with son at bedside today.  He has chronic nausea that severely limits ability to tolerate PO intake.  Has been issue for a long time.  Pt reports hip is sore but pain controlled when medications given.  No other acute complaints.   Physical Exam: Vitals:   03/13/23 0806 03/13/23 1458 03/13/23 2326 03/14/23 0748  BP: 113/70 109/70 108/67 114/67  Pulse: (!) 107 96 96 (!) 105  Resp: 15 16 18 17   Temp: 97.6 F (36.4 C) (!) 97.5 F (36.4 C) 97.9 F (36.6 C) (!) 97.5 F (36.4 C)  TempSrc:   Oral   SpO2: 95% 96% 92% 94%    Physical Exam General exam: awake, alert, no acute distress, frail HEENT: moist mucus membranes, hearing grossly normal  Respiratory system: CTAB, no wheezes, rales or rhonchi, normal respiratory effort. Cardiovascular system: normal S1/S2, RRR,  no pedal edema.   Gastrointestinal system: soft, NT, ND Central nervous system: A&O x3. no gross focal neurologic deficits, normal speech but poor vocal projection Extremities: no edema, normal tone Skin: dry, intact, normal temperature Psychiatry: normal mood, congruent affect, judgement and insight appear normal   Data Reviewed:    Latest Ref Rng & Units 03/12/2023    5:26 AM 03/10/2023    4:57 AM 03/09/2023   12:20 PM  CBC  WBC 4.0 - 10.5 K/uL 14.5  9.6  11.8   Hemoglobin 13.0 - 17.0 g/dL 9.4  8.0  9.2   Hematocrit 39.0 - 52.0 % 28.0  24.4  28.8   Platelets 150 - 400 K/uL 371  286  337       Latest Ref Rng & Units 03/12/2023    5:26 AM 03/10/2023  4:57 AM 03/09/2023   12:20 PM  BMP  Glucose 70 - 99 mg/dL 93  87  295   BUN 8 - 23 mg/dL 11  12  18    Creatinine 0.61 - 1.24 mg/dL 6.21  3.08  6.57   Sodium 135 - 145 mmol/L 134  137  137   Potassium 3.5 - 5.1 mmol/L 4.0  3.8  3.8   Chloride 98 - 111 mmol/L 97  100  97   CO2 22 - 32 mmol/L 26  26  26    Calcium 8.9 - 10.3 mg/dL 8.4  8.2  8.7    Family Communication: Son at bedside on rounds  Disposition: Status is: Inpatient Remains inpatient  appropriate because: Fall and fracture.  SNF placement pending.  Planned Discharge Destination: SNF     Time spent: 42 minutes  Author: Pennie Banter, DO 03/14/2023 3:06 PM  For on call review [email or web address].

## 2023-03-14 NOTE — Progress Notes (Signed)
Occupational Therapy Treatment Patient Details Name: Aaron Short MRN: [id number] DOB: [birth year] Today's Date: 03/14/2023   History of present illness Pt is a 77 y/o M admitted on 03/09/23 after presenting with c/o a fall. Pt also noted to have a fall on 02/28/23 as well. Pt with very minimally displaced R greater trochanter periprosthetic fx around R THA and now with L proximal anterior femur periprosthetic femur fx around the THA. Orthopedics recommending conservative management. PMH: CA, DM, a-fib, HTN   OT comments  Chart reviewed prior to tx session. Pt seen for OT/PT co treatment on this date. Upon arrival to room pt awake in bed with wife and son present. Tx session targeted improved transfer completion with use of a transfer board. Pt was notably sleepy in the start of session, alertness increased with talking. Pt requires MAX A with SCDs. Bed mobility; Pt required MOD A + 2 to get to EOB. Pt completed transfer board t/f from EOB <> recliner; Pt attempted to initiate t/f, but required MAX physical assistance. Vitals monitored throughout session; pre mobility; BP 106/70 (MAP 81), HR 102, SpO2 95%. Sitting EOB -  BP 105/53 (MAP 68), HR: 122, SpO2 95% post mobility- 108/69 (MAP 80), HR: 123. Family education; step by step transfer board t/f and DME/AD suggestions for home d/c. Pt making progress toward goals, will continue to follow POC. Discharge recommendation remains appropriate. OT will follow acutely.           If plan is discharge home, recommend the following:  A lot of help with walking and/or transfers;A lot of help with bathing/dressing/bathroom;Assistance with feeding;Assistance with cooking/housework;Assist for transportation;A little help with bathing/dressing/bathroom;Direct supervision/assist for medications management   Equipment Recommendations  Hospital bed;Other (comment) (transfer board, hoyer lift, manual wheelchair with drop arm/removable arm rest and leg  rests, drop arm BSC)    Recommendations for Other Services      Precautions / Restrictions Precautions Precautions: Fall Precaution Comments: Per order from orthopedic surgeon [name] on 03/10/2023: "The patient is NWB on the bilateral LE. May have transfer training bed to chair with walker, slide board with minimal weight bearing." Restrictions Weight Bearing Restrictions: Yes RLE Weight Bearing: Non weight bearing LLE Weight Bearing: Non weight bearing Other Position/Activity Restrictions: Per Dr. Rosann Auerbach, previously ordered R hip abduction brace is not necessary.       Mobility Bed Mobility Overal bed mobility: Needs Assistance Bed Mobility: Supine to Sit Rolling: Mod assist, +2 for physical assistance   Supine to sit: HOB elevated, +2 for physical assistance, Mod assist, Used rails     General bed mobility comments: Pt able to initiate bed mobility but requires MOD A for scooting towards EOB and trunk support    Transfers Overall transfer level: Needs assistance Equipment used: Sliding board Transfers: Bed to chair/wheelchair/BSC            Lateral/Scoot Transfers: Max assist General transfer comment: requires maxA x1 for lat scoot on transfer board; Pt able to initiate scooting on transfer board but demonstrates fatigue; cuing for adherence of BLE NWB during task     Balance Overall balance assessment: Needs assistance, History of Falls Sitting-balance support: Feet supported, Bilateral upper extremity supported Sitting balance-Leahy Scale: Poor Sitting balance - Comments: Pt able to maintain static seated EOB balance intermittent ~30 sec, requiring MIN A for maintain seated EOB balance Postural control: Posterior lean, Right lateral lean   Standing balance-Leahy Scale: Zero Standing balance comment: Unable to formally assess due to BLE NWB  per MD                           ADL either performed or assessed with clinical judgement   ADL  Overall ADL's : Needs assistance/impaired                       Lower Body Dressing Details (indicate cue type and reason): SCDs MAX A               General ADL Comments: anticipate MOD-MAX assist  with LB dressing, bathing at this time, supervision-MIN A for UB dressing, grooming, feeding    Extremity/Trunk Assessment              Vision       Perception     Praxis      Cognition Arousal: Alert Behavior During Therapy: WFL for tasks assessed/performed Overall Cognitive Status: Within Functional Limits for tasks assessed                                 General Comments: Pleasant and cooperative with PT/OT session        Exercises Other Exercises Other Exercises: Edu: home modifications recommendations, DME/AD suggestions for home d/c, safe transfer techniques with AD/DME    Shoulder Instructions       General Comments pre- 106/70 (81), pulse: 102, SpO2 95% sitting EOB- 105/53 (68), pulse: 122, SpO2 95% post- 108/69 (80), pulse: 123    Pertinent Vitals/ Pain       Pain Assessment Pain Assessment: Faces Faces Pain Scale: Hurts a little bit Pain Location: L proximal hip Pain Descriptors / Indicators: Discomfort, Grimacing, Sore Pain Intervention(s): Limited activity within patient's tolerance, Monitored during session  Home Living                                          Prior Functioning/Environment              Frequency  Min 1X/week        Progress Toward Goals  OT Goals(current goals can now be found in the care plan section)        Plan      Co-evaluation      Reason for Co-Treatment: Complexity of the patient's impairments (multi-system involvement);To address functional/ADL transfers;For patient/therapist safety PT goals addressed during session: Mobility/safety with mobility;Balance;Proper use of DME;Strengthening/ROM OT goals addressed during session: ADL's and self-care;Proper use of  Adaptive equipment and DME;Strengthening/ROM      AM-PAC OT "6 Clicks" Daily Activity     Outcome Measure   Help from another person eating meals?: A Little Help from another person taking care of personal grooming?: A Little Help from another person toileting, which includes using toliet, bedpan, or urinal?: A Lot Help from another person bathing (including washing, rinsing, drying)?: A Lot Help from another person to put on and taking off regular upper body clothing?: A Lot Help from another person to put on and taking off regular lower body clothing?: A Lot 6 Click Score: 14    End of Session    OT Visit Diagnosis: Unsteadiness on feet (R26.81);Repeated falls (R29.6);Muscle weakness (generalized) (M62.81);Other abnormalities of gait and mobility (R26.89)   Activity Tolerance Patient tolerated treatment well   Patient Left in chair;with  call bell/phone within reach;with chair alarm set;with family/visitor present   Nurse Communication Mobility status, team re: DME needs        Time: 1191-4782 OT Time Calculation (min): 28 min  Charges: OT General Charges $OT Visit: 1 Visit OT Treatments $Self Care/Home Management : 8-22 mins  Glenard Haring, OTS

## 2023-03-15 ENCOUNTER — Inpatient Hospital Stay: Payer: Medicare Other

## 2023-03-15 DIAGNOSIS — W19XXXA Unspecified fall, initial encounter: Secondary | ICD-10-CM | POA: Diagnosis not present

## 2023-03-15 DIAGNOSIS — C159 Malignant neoplasm of esophagus, unspecified: Secondary | ICD-10-CM

## 2023-03-15 DIAGNOSIS — S72002A Fracture of unspecified part of neck of left femur, initial encounter for closed fracture: Secondary | ICD-10-CM | POA: Diagnosis not present

## 2023-03-15 DIAGNOSIS — E860 Dehydration: Secondary | ICD-10-CM | POA: Diagnosis not present

## 2023-03-15 DIAGNOSIS — Y92009 Unspecified place in unspecified non-institutional (private) residence as the place of occurrence of the external cause: Secondary | ICD-10-CM | POA: Diagnosis not present

## 2023-03-15 DIAGNOSIS — Z515 Encounter for palliative care: Secondary | ICD-10-CM | POA: Diagnosis not present

## 2023-03-15 LAB — CBC
HCT: 27.8 % — ABNORMAL LOW (ref 39.0–52.0)
Hemoglobin: 9.1 g/dL — ABNORMAL LOW (ref 13.0–17.0)
MCH: 35.3 pg — ABNORMAL HIGH (ref 26.0–34.0)
MCHC: 32.7 g/dL (ref 30.0–36.0)
MCV: 107.8 fL — ABNORMAL HIGH (ref 80.0–100.0)
Platelets: 353 10*3/uL (ref 150–400)
RBC: 2.58 MIL/uL — ABNORMAL LOW (ref 4.22–5.81)
RDW: 16.1 % — ABNORMAL HIGH (ref 11.5–15.5)
WBC: 15.2 10*3/uL — ABNORMAL HIGH (ref 4.0–10.5)
nRBC: 0 % (ref 0.0–0.2)

## 2023-03-15 LAB — URINALYSIS, ROUTINE W REFLEX MICROSCOPIC
Bilirubin Urine: NEGATIVE
Glucose, UA: NEGATIVE mg/dL
Hgb urine dipstick: NEGATIVE
Ketones, ur: NEGATIVE mg/dL
Leukocytes,Ua: NEGATIVE
Nitrite: NEGATIVE
Protein, ur: NEGATIVE mg/dL
Specific Gravity, Urine: 1.017 (ref 1.005–1.030)
pH: 5 (ref 5.0–8.0)

## 2023-03-15 LAB — PROCALCITONIN: Procalcitonin: <0.1 ng/mL

## 2023-03-15 MED ORDER — HYDROMORPHONE HCL 1 MG/ML IJ SOLN
0.5000 mg | INTRAMUSCULAR | Status: DC | PRN
  Administered 2023-03-17 – 2023-03-20 (×4): 0.5 mg via INTRAVENOUS
  Filled 2023-03-15 (×4): qty 0.5

## 2023-03-15 MED ORDER — OXYCODONE HCL 5 MG PO TABS
10.0000 mg | ORAL_TABLET | ORAL | Status: DC | PRN
  Administered 2023-03-15 – 2023-03-17 (×6): 10 mg via ORAL
  Administered 2023-03-17: 15 mg via ORAL
  Administered 2023-03-18: 10 mg via ORAL
  Administered 2023-03-19 – 2023-03-21 (×3): 15 mg via ORAL
  Filled 2023-03-15 (×4): qty 2
  Filled 2023-03-15 (×2): qty 3
  Filled 2023-03-15: qty 2
  Filled 2023-03-15 (×2): qty 3
  Filled 2023-03-15 (×2): qty 2

## 2023-03-15 MED ORDER — OXYCODONE HCL 5 MG PO TABS: 5.0000 mg | ORAL_TABLET | ORAL | Status: DC | PRN

## 2023-03-15 NOTE — Plan of Care (Signed)
Problem: Education: Goal: Knowledge of General Education information will improve Description: Including pain rating scale, medication(s)/side effects and non-pharmacologic comfort measures Outcome: Progressing   Problem: Health Behavior/Discharge Planning: Goal: Ability to manage health-related needs will improve Outcome: Progressing   Problem: Clinical Measurements: Goal: Ability to maintain clinical measurements within normal limits will improve Outcome: Progressing   Problem: Activity: Goal: Risk for activity intolerance will decrease Outcome: Progressing   Problem: Nutrition: Goal: Adequate nutrition will be maintained Outcome: Progressing   Problem: Safety: Goal: Ability to remain free from injury will improve Outcome: Progressing

## 2023-03-15 NOTE — Progress Notes (Signed)
Palliative Care Progress Note, Assessment & Plan   Patient Name: Aaron Short       Date: 03/15/2023 DOB: 10-14-1945  Age: 77 y.o. MRN#: [id number] Attending Physician: Pennie Banter, DO Primary Care Physician: Rosemarie Ax, MD Admit Date: 03/09/2023  Subjective: Patient is lying in bed in no apparent distress.  He acknowledges my presence and is able to make his wishes known.  He speaks in full sentences but keeps his eyes closed for the remainder of our visit.  His wife and son are at bedside.  HPI: 77 y.o. male  with past medical history of atrial fibrillation on Xarelto, HTN, T2DM and esophageal adenocarcinoma (not currently receiving treatments) admitted from home on 03/09/2023 with recurrent falls.    Complaining of left hip pain Imaging revealed acute comminuted nondisplaced fracture of the left greater trochanter extending to the femoral prosthesis   Noted recent admit 10/4-10/6 s/p mechanical fall found to have right mildly displaced proximal femoral periprosthetic fracture   Chemotherapy has been placed on hold due significant functional decline, weight loss and now with bilateral hip fractures.   Palliative medicine was consulted for assisting with goals of care conversations.  Summary of counseling/coordination of care: Extensive chart review completed prior to meeting patient including labs, vital signs, imaging, progress notes, orders, and available advanced directive documents from current and previous encounters.   After reviewing the patient's chart and assessing the patient at bedside, I spoke with patient and family in regards to symptom management and boundaries of care.  Pain assessed.  Patient endorses the Dilaudid does not help him.  He is requesting Oxy  IR.  Family shares patient was taking 20 mg of oxycodone immediate release every 6 hours at home.  In review of PDMP, patient was receiving hydromorphone 2 mg p.o. and had not filled the oxycodone for almost a month.  We discussed transitioning off of IV medications to p.o. medications.  I recommend starting oxycodone IR 10 mg every 4 hours as needed with additional dose of 1 mg of Dilaudid available for breakthrough/severe pain.  I discussed next steps for patient.  Family shares they would like to continue with PT/OT and have a list of DME equipment they would like to have delivered to the home.  They are also concerned about patient's pain being appropriately managed.  I attempted to elicit goals and values to the of the patient and family.  They shared they would like for patient to be at home and for his symptoms to be as well-controlled as possible.  They would like his quality of life to be boosted without him having to go back and forth to so many doctors appointments.  In light of these comments, I highlighted the benefits of enacting hospice benefits.  Hospice philosophy, aging in place, and aggressive symptom management reviewed with family.  Questions and concerns were addressed to the best of my ability.  Family is still unsure if they would like to proceed with regular medical treatment or to enact hospice benefits.  They would like the evening to decide.  Above conveyed to attending and TOC.  PMT will continue to follow and support patient and family throughout his hospitalization.  I have plan to meet with family bedside at 11 AM tomorrow to further discuss goals and plan of care.  Physical Exam Vitals reviewed.  Constitutional:      Comments: Thin, frail  HENT:     Nose: Nose normal.     Mouth/Throat:     Mouth: Mucous membranes are moist.  Eyes:     Pupils: Pupils are equal, round, and reactive to light.  Pulmonary:     Effort: Pulmonary effort is normal.  Abdominal:      Palpations: Abdomen is soft.  Skin:    General: Skin is warm and dry.  Neurological:     Mental Status: He is alert and oriented to person, place, and time.  Psychiatric:        Mood and Affect: Mood normal.        Behavior: Behavior normal.        Thought Content: Thought content normal.        Judgment: Judgment normal.             Total Time 50 minutes   Time spent includes: Detailed review of medical records (labs, imaging, vital signs), medically appropriate exam (mental status, respiratory, cardiac, skin), discussed with treatment team, counseling and educating patient, family and staff, documenting clinical information, medication management and coordination of care.  Samara Deist L. Bonita Quin, DNP, FNP-BC Palliative Medicine Team

## 2023-03-15 NOTE — Progress Notes (Addendum)
PT Cancellation Note  Patient Details Name: Aaron Short MRN: [id number] DOB: 09-17-1945   Cancelled Treatment:     PT attempt. Pt having palliative care mtg this afternoon at 3pm. Pt/pt's son requested Thereasa Parkin return afterwards. Acute PT will continue to follow and progress per current POC.  1617/ 4:18 pm: Pt supine in bed with supportive spouse at bedside. Pt struggling to stay awake. Spouse endorses wanting pt to rest and that they will be making care plan decisions tomorrow at 11am. She requested PT wait until after that meeting. Chartered loss adjuster and spouse did spend a lot of time discussing importance of mobility/OOB activity/ exercises/ hoyer lift transfers+ slide board transfers/ etc. Pt's spouse appreciative of information. Acute PT will return tomorrow after lunch as requested.    Rushie Chestnut 03/15/2023, 12:48 PM

## 2023-03-15 NOTE — Progress Notes (Addendum)
Progress Note Patient: Aaron Short RUE:[id number] DOB: 07/05/1945 DOA: 03/09/2023     6 DOS: the patient was seen and examined on 03/15/2023   Brief hospital course: 77 y.o. male PMH of HTN, esophageal cancer, h/o pelvic and hip fracture, chronic pain syndrome, HLD, bladder cancer, smoker, Afib, vertebral fractures, DDD.   They presented to the local emergency department on 03/09/2023 after ground-level fall at home.   Orthopedics was consulted and based on the fracture pattern and the patient's overall medical condition nonoperative treatment was recommended.  Since admission, patient has been stable and evaluated by PT/OT who recommended SNF.  Family and patient are discussing goals and preferences for discharge at this time. He is considering comfort/hospice options. Appreciate palliative input.   Assessment and Plan: Fall at home, initial encounter  acute closed left hip fracture  prior existing R hip fracture:  Attribute to combination of factors including low blood pressure, medications, deconditioning, recent right hip fracture. - Currently fall precautions - PT/OT - analgesia PRN -- added PO oxycodone, IV for breakthrough pain only, Robaxin PRN - conservative management, per ortho  Leukocytosis - initially felt reactive due to fracture.  WBC normalized to 9.6 on 10/12, but has increased 14.5 >> 15.2 today (10/17).  No fevers or overt s/sx's of infection. --check procal, UA, CXR --monitor CBC  Chronic anemia : stable hgb 9.4 - PPI.  Hypertension, benign :   On midodrine for hypotension - continue   Atrial fibrillation and flutter  EKG changes A-fib with PVCs, low voltage, QTc of 395, nonspecific ST-T wave changes. Continue Xarelto.   Esophageal cancer -- chemotherapy on hold due to significant functional decline --Palliative care following, ongoing GOC discussions   Severe calorie deficit malnutrition- contributing to falls and fractures. Poor PO intake  due to low appetite, chronic nausea, dysphagia from complex history including esophageal cancer. Has worsened over past 3 months and had significant weight loss. Approached idea of possible PEG tube and he and son were appreciative of the idea but when later spoke with GI, he and wife declined the option.  - continue nutritional supplements --continue Marinol --Ativan PRN nausea --trial scheduled Zofran ODT's started on 10/16      Subjective: Pt was sleeping soundly this AM on rounds, wakes to voice briefly.  Denies complaints at the time.  No family in room at the time.    Physical Exam: Vitals:   03/14/23 0748 03/14/23 1506 03/14/23 2320 03/15/23 0733  BP: 114/67 103/75 105/68 106/61  Pulse: (!) 105 (!) 106  (!) 101  Resp: 17 17 15 16   Temp: (!) 97.5 F (36.4 C) 97.6 F (36.4 C) 98.5 F (36.9 C) 97.7 F (36.5 C)  TempSrc:    Oral  SpO2: 94% 98% 92% 94%  Weight:      Height:        Physical Exam General exam: sleeping comfortably, wakes to voice, no acute distress, frail HEENT: moist mucus membranes, hearing grossly normal  Respiratory system: on room air, normal respiratory effort. Cardiovascular system: RRR,  no pedal edema.   Gastrointestinal system: soft, NT, ND Central nervous system: no gross focal neurologic deficits, limited exam due to somnolence Extremities: no edema, normal tone Skin: dry, intact, normal temperature Psychiatry: limited exam due to somnolence   Data Reviewed:    Latest Ref Rng & Units 03/15/2023   10:28 AM 03/12/2023    5:26 AM 03/10/2023    4:57 AM  CBC  WBC 4.0 - 10.5 K/uL 15.2  14.5  9.6   Hemoglobin 13.0 - 17.0 g/dL 9.1  9.4  8.0   Hematocrit 39.0 - 52.0 % 27.8  28.0  24.4   Platelets 150 - 400 K/uL 353  371  286       Latest Ref Rng & Units 03/12/2023    5:26 AM 03/10/2023    4:57 AM 03/09/2023   12:20 PM  BMP  Glucose 70 - 99 mg/dL 93  87  161   BUN 8 - 23 mg/dL 11  12  18    Creatinine 0.61 - 1.24 mg/dL 0.96  0.45  4.09    Sodium 135 - 145 mmol/L 134  137  137   Potassium 3.5 - 5.1 mmol/L 4.0  3.8  3.8   Chloride 98 - 111 mmol/L 97  100  97   CO2 22 - 32 mmol/L 26  26  26    Calcium 8.9 - 10.3 mg/dL 8.4  8.2  8.7    Family Communication: Son at bedside on rounds 10/16. None present today, will attempt to call.  Disposition: Status is: Inpatient Remains inpatient appropriate because: SNF placement pending vs DME needs ordered before d/c home w HH.  Planned Discharge Destination: SNF vs HH     Time spent: 35 minutes  Author: Pennie Banter, DO 03/15/2023 2:14 PM  For on call review [email or web address].

## 2023-03-15 NOTE — TOC Progression Note (Addendum)
Transition of Care [facility]) - Progression Note    Patient Details  Name: Aaron Short MRN: [id number] Date of Birth: March 20, 1946  [facility]) CM/SW Contact  Hetty Ely, RN Phone Number: 03/15/2023, 2:45 PM  Clinical Narrative: CM spoke with wife about discharge home, she says they have a 3pm training with PT/OT and will speak with me afterwards about discharge plan and needs.   4:15 pm Per covering Attending, family will meet tomorrow, 03/16/23 at 11am to confirm discharge plan, possibly Hospice at home. CM to follow up per decision.        Expected Discharge Plan and Services                                               Social Determinants of Health (SDOH) Interventions SDOH Screenings   Food Insecurity: No Food Insecurity (03/10/2023)  Housing: Low Risk  (03/10/2023)  Transportation Needs: No Transportation Needs (03/10/2023)  Utilities: Not At Risk (03/10/2023)  Depression (PHQ2-9): Low Risk  (10/12/2022)  Financial Resource Strain: Low Risk  (11/03/2022)  Physical Activity: Sufficiently Active (02/16/2021)   Received from [facility], [facility]  Social Connections: Moderately Integrated (02/16/2021)   Received from [facility], [facility]  Stress: No Stress Concern Present (11/03/2022)  Tobacco Use: Medium Risk (03/09/2023)  Health Literacy: Low Risk  (05/02/2021)   Received from [facility], [facility] Care    Readmission Risk Interventions     No data to display

## 2023-03-16 DIAGNOSIS — Z515 Encounter for palliative care: Secondary | ICD-10-CM | POA: Diagnosis not present

## 2023-03-16 DIAGNOSIS — Y92009 Unspecified place in unspecified non-institutional (private) residence as the place of occurrence of the external cause: Secondary | ICD-10-CM | POA: Diagnosis not present

## 2023-03-16 DIAGNOSIS — C159 Malignant neoplasm of esophagus, unspecified: Secondary | ICD-10-CM | POA: Diagnosis not present

## 2023-03-16 DIAGNOSIS — S72002A Fracture of unspecified part of neck of left femur, initial encounter for closed fracture: Secondary | ICD-10-CM | POA: Diagnosis not present

## 2023-03-16 DIAGNOSIS — W19XXXA Unspecified fall, initial encounter: Secondary | ICD-10-CM | POA: Diagnosis not present

## 2023-03-16 MED ORDER — METHOCARBAMOL 1000 MG/10ML IJ SOLN: 500.0000 mg | Freq: Four times a day (QID) | INTRAMUSCULAR | Status: DC | PRN

## 2023-03-16 MED ORDER — ONDANSETRON HCL 4 MG PO TABS
8.0000 mg | ORAL_TABLET | Freq: Three times a day (TID) | ORAL | Status: DC | PRN
  Administered 2023-03-18 – 2023-03-19 (×2): 8 mg via ORAL
  Filled 2023-03-16 (×2): qty 2

## 2023-03-16 MED ORDER — CEFDINIR 250 MG/5ML PO SUSR
300.0000 mg | Freq: Two times a day (BID) | ORAL | Status: AC
  Administered 2023-03-16 – 2023-03-20 (×10): 300 mg via ORAL
  Filled 2023-03-16 (×10): qty 6

## 2023-03-16 MED ORDER — METHOCARBAMOL 500 MG PO TABS: 500.0000 mg | ORAL_TABLET | Freq: Four times a day (QID) | ORAL | Status: DC | PRN

## 2023-03-16 NOTE — TOC Progression Note (Signed)
Transition of Care [facility]) - Progression Note    Patient Details  Name: Aaron Short MRN: [id number] Date of Birth: Jan 23, 1946  Transition of Care [facility]) CM/SW Contact  Garret Reddish, RN Phone Number: 03/16/2023, 1:49 PM  Clinical Narrative:    Chart reviewed.  Noted that family meet with Pallative care today.  Patient has been started on IV antibiotics due to increased WBC and chest x-ray findings.  Wife continues to lean toward home with Hospice but is does not want to make that decision at this time.    TOC will continue to follow for discharge planning.         Expected Discharge Plan and Services                                               Social Determinants of Health (SDOH) Interventions SDOH Screenings   Food Insecurity: No Food Insecurity (03/10/2023)  Housing: Low Risk  (03/10/2023)  Transportation Needs: No Transportation Needs (03/10/2023)  Utilities: Not At Risk (03/10/2023)  Depression (PHQ2-9): Low Risk  (10/12/2022)  Financial Resource Strain: Low Risk  (11/03/2022)  Physical Activity: Sufficiently Active (02/16/2021)   Received from [facility], [facility]  Social Connections: Moderately Integrated (02/16/2021)   Received from [facility], [facility]  Stress: No Stress Concern Present (11/03/2022)  Tobacco Use: Medium Risk (03/09/2023)  Health Literacy: Low Risk  (05/02/2021)   Received from [facility], [facility]    Readmission Risk Interventions     No data to display

## 2023-03-16 NOTE — Progress Notes (Signed)
Palliative Care Progress Note, Assessment & Plan   Patient Name: Aaron Short       Date: 03/16/2023 DOB: 1946/01/17  Age: 77 y.o. MRN#: [id number] Attending Physician: Pennie Banter, DO Primary Care Physician: Rosemarie Ax, MD Admit Date: 03/09/2023  Subjective: Patient is lying in bed in no apparent distress.  He acknowledges my presence and is able to make his wishes known.  His wife is at bedside during my visit.  HPI: 77 y.o. male  with past medical history of atrial fibrillation on Xarelto, HTN, T2DM and esophageal adenocarcinoma (not currently receiving treatments) admitted from home on 03/09/2023 with recurrent falls.    Complaining of left hip pain Imaging revealed acute comminuted nondisplaced fracture of the left greater trochanter extending to the femoral prosthesis   Noted recent admit 10/4-10/6 s/p mechanical fall found to have right mildly displaced proximal femoral periprosthetic fracture   Chemotherapy has been placed on hold due significant functional decline, weight loss and now with bilateral hip fractures.   Palliative medicine was consulted for assisting with goals of care conversations.  Summary of counseling/coordination of care: Extensive chart review completed prior to meeting patient including labs, vital signs, imaging, progress notes, orders, and available advanced directive documents from current and previous encounters.   After reviewing the patient's chart and assessing the patient at bedside, I spoke with patient in regards to symptom management and plan of care.  Symptoms assessed.  Patient endorses his pain was not well-managed but tolerable overnight.  We discussed that patient did not utilize maximum dose of oxycodone IR and did not take it as  frequently as it is available.  We went over his pain medication as well as his muscle relaxer in detail.  Patient aware that he can have 15 mg of Oxy IR every 4 hours as needed when appropriate.  We discussed next steps for patient.  Patient's wife shares concern that he is now being treated for potential pneumonia with IV antibiotics.  We discussed that he will likely need 1 or 2 more days of monitoring to ensure this does not turn into a pneumonia.  I asked family and patient if they had made a decision in regards to next steps after the hospitalization.  Wife shares she is leaning towards hospice but is not ready to make a final decision at this time.  I discussed importance of use of incentive spirometer for pulmonary toileting.  I-S provided by nurse and education provided on how and when to use.  Also discussed that if family would like to move forward with enacting hospice benefits, patient's midodrine may or may not be stopped.  I have reached out to hospice to gain insight on whether patient could continue midodrine or not.  Awaiting response.  No adjustment to plan of care made at this time.  PMT will continue to follow and support patient throughout his hospitalization.   Family aware that there is decreased PMT staff over the weekend.  They have PMT contact info and were encouraged to call with any acute palliative needs.  Otherwise, please utilize [employer] for on-call providers over the weekend and I plan to follow-up with patient and family on Monday.  Physical Exam Vitals reviewed.  Constitutional:      Comments: Thin, frail  HENT:     Mouth/Throat:     Mouth: Mucous membranes are moist.  Eyes:     Pupils: Pupils are equal, round, and reactive to light.  Pulmonary:     Effort: Pulmonary effort is normal.  Abdominal:     Palpations: Abdomen is soft.  Musculoskeletal:     Comments: Generalized weakness  Skin:    General: Skin is warm and dry.  Neurological:     Mental  Status: He is alert and oriented to person, place, and time.  Psychiatric:        Mood and Affect: Mood normal.        Behavior: Behavior normal.        Thought Content: Thought content normal.             Total Time 35 minutes   Time spent includes: Detailed review of medical records (labs, imaging, vital signs), medically appropriate exam (mental status, respiratory, cardiac, skin), discussed with treatment team, counseling and educating patient, family and staff, documenting clinical information, medication management and coordination of care.  Samara Deist L. Bonita Quin, DNP, FNP-BC Palliative Medicine Team

## 2023-03-16 NOTE — Progress Notes (Signed)
Physical Therapy Treatment Patient Details Name: Aaron Short MRN: [id number] DOB: [birth year] Today's Date: 03/16/2023   History of Present Illness Pt is a 77 y/o M admitted on 03/09/23 after presenting with c/o a fall. Pt also noted to have a fall on 02/28/23 as well. Pt with very minimally displaced R greater trochanter periprosthetic fx around R THA and now with L proximal anterior femur periprosthetic femur fx around the THA. Orthopedics recommending conservative management. PMH: CA, DM, a-fib, HTN    PT Comments  {T was long sitting in bed with supportive spouse at bedside. Pt is A and agreeable to bed level there ex however is interested in getting OOB to recliner next date. See exercises performed listed below. Pt/family still planning to Dc directly home. Due to pt's current medical conditions and BLE NWB restrictions, author recommends hoyer lift, slideboard, w/c + cushion , and drop arm BSC. Acute PT will continue to follow per current POC with focus remaining on caregiver training and strengthening for maximal independence and decreased caregiver burden.   If plan is discharge home, recommend the following: Two people to help with walking and/or transfers;Two people to help with bathing/dressing/bathroom;Assistance with cooking/housework;Direct supervision/assist for medications management;Direct supervision/assist for financial management;Assist for transportation;Help with stairs or ramp for entrance     Equipment Recommendations  Rolling walker (2 wheels);BSC/3in1;Wheelchair (measurements PT);Wheelchair cushion (measurements PT);Hospital bed;Hoyer lift (drop arm BSC)       Precautions / Restrictions Precautions Precautions: Fall Precaution Comments: Per order from orthopedic surgeon [name] on 03/10/2023: "The patient is NWB on the bilateral LE. May have transfer training bed to chair with walker, slide board with minimal weight bearing." Restrictions Weight  Bearing Restrictions: Yes RLE Weight Bearing: Non weight bearing LLE Weight Bearing: Non weight bearing Other Position/Activity Restrictions: Per Dr. Rosann Auerbach, previously ordered R hip abduction brace is not necessary.     Mobility  Bed Mobility  General bed mobility comments: due to time of day/ pt fatigue. pt/author elected to perform bed level ther ex only. spouse present. both pt/spouse endorse wanting pt OOB 03/17/23            Cognition Arousal: Alert Behavior During Therapy: WFL for tasks assessed/performed Overall Cognitive Status: Within Functional Limits for tasks assessed      General Comments: pt was extremely pleasant and cooperative but presents with weakness/poor activity tolerance        Exercises Total Joint Exercises Ankle Circles/Pumps: AROM, Strengthening, Both, 10 reps, Supine Quad Sets: AROM, Strengthening, Both, 10 reps, Supine Gluteal Sets: AROM, Strengthening, Both, 10 reps, Supine Heel Slides: AROM, Strengthening, Both, 10 reps, Supine Straight Leg Raises: AROM, Strengthening, Both, 10 reps, Supine    General Comments General comments (skin integrity, edema, etc.): reviewed previously issued HEP handout and had pt perform BUE ther ex with red TB in room      Pertinent Vitals/Pain Pain Assessment Pain Assessment: PAINAD Breathing: occasional labored breathing, short period of hyperventilation Negative Vocalization: occasional moan/groan, low speech, negative/disapproving quality Facial Expression: smiling or inexpressive Body Language: relaxed Consolability: distracted or reassured by voice/touch PAINAD Score: 3 Pain Location: BLEs R>L Pain Descriptors / Indicators: Discomfort, Grimacing, Sore Pain Intervention(s): Limited activity within patient's tolerance, Monitored during session, Premedicated before session, Repositioned     PT Goals (current goals can now be found in the care plan section) Acute Rehab PT Goals Patient Stated Goal: get  stronger and get home Progress towards PT goals: Not progressing toward goals - comment (limited by BLE  NWB status/ Other medical complications)    Frequency    Min 1X/week           Co-evaluation     PT goals addressed during session: Strengthening/ROM        AM-PAC PT "6 Clicks" Mobility   Outcome Measure  Help needed turning from your back to your side while in a flat bed without using bedrails?: A Lot Help needed moving from lying on your back to sitting on the side of a flat bed without using bedrails?: A Lot Help needed moving to and from a bed to a chair (including a wheelchair)?: Total Help needed standing up from a chair using your arms (e.g., wheelchair or bedside chair)?: Total Help needed to walk in hospital room?: Total Help needed climbing 3-5 steps with a railing? : Total 6 Click Score: 8    End of Session   Activity Tolerance: Patient tolerated treatment well;Patient limited by fatigue Patient left: in bed;with call bell/phone within reach;with family/visitor present Nurse Communication: Mobility status PT Visit Diagnosis: Muscle weakness (generalized) (M62.81);History of falling (Z91.81);Difficulty in walking, not elsewhere classified (R26.2);Pain;Other abnormalities of gait and mobility (R26.89) Pain - Right/Left: Left Pain - part of body: Hip     Time: 3086-5784 PT Time Calculation (min) (ACUTE ONLY): 14 min  Charges:    $Therapeutic Exercise: 8-22 mins PT General Charges $$ ACUTE PT VISIT: 1 Visit                     Jetta Lout PTA 03/16/23, 8:14 PM

## 2023-03-16 NOTE — Plan of Care (Signed)

## 2023-03-16 NOTE — Progress Notes (Signed)
Progress Note Patient: Aaron Short FAO:[id number] DOB: [birth year] DOA: 03/09/2023     7 DOS: the patient was seen and examined on 03/16/2023   Brief hospital course: 77 y.o. male PMH of HTN, esophageal cancer, h/o pelvic and hip fracture, chronic pain syndrome, HLD, bladder cancer, smoker, Afib, vertebral fractures, DDD.   They presented to the local emergency department on 03/09/2023 after ground-level fall at home.   Orthopedics was consulted and based on the fracture pattern and the patient's overall medical condition nonoperative treatment was recommended.  Since admission, patient has been stable and evaluated by PT/OT who recommended SNF.  Family and patient are discussing goals and preferences for discharge at this time. He is considering comfort/hospice options. Appreciate palliative input.   Assessment and Plan: Fall at home, initial encounter  acute closed left hip fracture  prior existing R hip fracture:  Attribute to combination of factors including low blood pressure, medications, deconditioning, recent right hip fracture. - Currently fall precautions - PT/OT - analgesia PRN -- added PO oxycodone, IV for breakthrough pain only, Robaxin PRN - conservative management, per ortho  Leukocytosis / Aspiration Pneumonia - initially felt reactive due to fracture.  WBC normalized to 9.6 on 10/12, but has increased 14.5 >> 15.2 today (10/17).  No fevers or overt s/sx's of infection. 10/17 - UA negative, CXR showed patchy bilateral opacities (infection vs inflammation - concern for aspiration given history).  --start Omnicef (PCN allergic) x 5 day course --monitor CBC  Chronic anemia : stable hgb 9.4 - PPI.  Hypertension, benign :   On midodrine for hypotension - continue   Atrial fibrillation and flutter  EKG changes A-fib with PVCs, low voltage, QTc of 395, nonspecific ST-T wave changes. Continue Xarelto.   Esophageal cancer -- chemotherapy on hold due to  significant functional decline --Palliative care following, ongoing GOC discussions   Severe calorie deficit malnutrition- contributing to falls and fractures. Poor PO intake due to low appetite, chronic nausea, dysphagia from complex history including esophageal cancer. Has worsened over past 3 months and had significant weight loss. Approached idea of possible PEG tube and he and son were appreciative of the idea but when later spoke with GI, he and wife declined the option.  - continue nutritional supplements --continue Marinol --Ativan PRN nausea --trial scheduled Zofran ODT's started on 10/16      Subjective: Pt seen with son at bedside this AM.  He reports feeling okay.  Denies feeling sick, fever/chills, but does endorse occasionally getting things down his airway with swallowing, due to his esophageal issues.  Has a weak cough and difficulty producing phlegm at times.  Agreeable to start antibiotic after we discussed elevated white count and chest xray findings.  Physical Exam: Vitals:   03/14/23 2320 03/15/23 0733 03/15/23 1425 03/15/23 2352  BP: 105/68 106/61 105/61 102/60  Pulse:  (!) 101 99 100  Resp: 15 16 16 18   Temp: 98.5 F (36.9 C) 97.7 F (36.5 C) 97.8 F (36.6 C) 98.1 F (36.7 C)  TempSrc:  Oral Oral   SpO2: 92% 94% 94% 95%  Weight:      Height:        Physical Exam General exam: awake & alert, no acute distress, frail HEENT: moist mucus membranes, hearing grossly normal  Respiratory system: on room air, normal respiratory effort. Cardiovascular system: RRR,  no pedal edema.   Gastrointestinal system: soft, NT, ND Central nervous system: A&Ox3, normal speech but poor vocal projection, grossly non-focal exam Extremities: no  edema, normal tone Skin: dry, intact, normal temperature Psychiatry: normal mood & affect, judgment and insight appear intact   Data Reviewed:    Latest Ref Rng & Units 03/15/2023   10:28 AM 03/12/2023    5:26 AM 03/10/2023    4:57  AM  CBC  WBC 4.0 - 10.5 K/uL 15.2  14.5  9.6   Hemoglobin 13.0 - 17.0 g/dL 9.1  9.4  8.0   Hematocrit 39.0 - 52.0 % 27.8  28.0  24.4   Platelets 150 - 400 K/uL 353  371  286       Latest Ref Rng & Units 03/12/2023    5:26 AM 03/10/2023    4:57 AM 03/09/2023   12:20 PM  BMP  Glucose 70 - 99 mg/dL 93  87  161   BUN 8 - 23 mg/dL 11  12  18    Creatinine 0.61 - 1.24 mg/dL 0.96  0.45  4.09   Sodium 135 - 145 mmol/L 134  137  137   Potassium 3.5 - 5.1 mmol/L 4.0  3.8  3.8   Chloride 98 - 111 mmol/L 97  100  97   CO2 22 - 32 mmol/L 26  26  26    Calcium 8.9 - 10.3 mg/dL 8.4  8.2  8.7    Family Communication: Son at bedside on rounds this AM  Disposition: Status is: Inpatient Remains inpatient appropriate because: ongoing evaluation of leukocytosis; d/c planning and GOC discussions underway   Planned Discharge Destination: SNF vs HH vs home w hospice     Time spent: 40 minutes  Author: Pennie Banter, DO 03/16/2023 12:55 PM  For on call review [email or web address].

## 2023-03-16 NOTE — Plan of Care (Signed)
[facility] Tonsillectomy/Adenoidectomy, Postoperative PEDS care plan entered in error.

## 2023-03-16 NOTE — Care Management Important Message (Signed)
Important Message  Patient Details  Name: Aaron Short MRN: [id number] Date of Birth: November 11, 1945   Important Message Given:  Yes - Medicare IM     [name] 03/16/2023, 1:55 PM

## 2023-03-17 DIAGNOSIS — W19XXXA Unspecified fall, initial encounter: Secondary | ICD-10-CM | POA: Diagnosis not present

## 2023-03-17 DIAGNOSIS — S72002A Fracture of unspecified part of neck of left femur, initial encounter for closed fracture: Secondary | ICD-10-CM | POA: Diagnosis not present

## 2023-03-17 DIAGNOSIS — Y92009 Unspecified place in unspecified non-institutional (private) residence as the place of occurrence of the external cause: Secondary | ICD-10-CM | POA: Diagnosis not present

## 2023-03-17 LAB — CBC
HCT: 28.3 % — ABNORMAL LOW (ref 39.0–52.0)
Hemoglobin: 9.2 g/dL — ABNORMAL LOW (ref 13.0–17.0)
MCH: 34.7 pg — ABNORMAL HIGH (ref 26.0–34.0)
MCHC: 32.5 g/dL (ref 30.0–36.0)
MCV: 106.8 fL — ABNORMAL HIGH (ref 80.0–100.0)
Platelets: 321 10*3/uL (ref 150–400)
RBC: 2.65 MIL/uL — ABNORMAL LOW (ref 4.22–5.81)
RDW: 15.9 % — ABNORMAL HIGH (ref 11.5–15.5)
WBC: 13.4 10*3/uL — ABNORMAL HIGH (ref 4.0–10.5)
nRBC: 0 % (ref 0.0–0.2)

## 2023-03-17 LAB — BASIC METABOLIC PANEL
Anion gap: 10 (ref 5–15)
BUN: 12 mg/dL (ref 8–23)
CO2: 31 mmol/L (ref 22–32)
Calcium: 8.4 mg/dL — ABNORMAL LOW (ref 8.9–10.3)
Chloride: 93 mmol/L — ABNORMAL LOW (ref 98–111)
Creatinine, Ser: 0.4 mg/dL — ABNORMAL LOW (ref 0.61–1.24)
GFR, Estimated: >60 mL/min (ref >60)
Glucose, Bld: 101 mg/dL — ABNORMAL HIGH (ref 70–99)
Potassium: 4.2 mmol/L (ref 3.5–5.1)
Sodium: 134 mmol/L — ABNORMAL LOW (ref 135–145)

## 2023-03-17 NOTE — Progress Notes (Addendum)
Occupational Therapy Treatment Patient Details Name: Aaron Short MRN: [id number] DOB: 1946-01-20 Today's Date: 03/17/2023   History of present illness Pt is a 77 y/o M admitted on 03/09/23 after presenting with c/o a fall. Pt also noted to have a fall on 02/28/23 as well. Pt with very minimally displaced R greater trochanter periprosthetic fx around R THA and now with L proximal anterior femur periprosthetic femur fx around the THA. Orthopedics recommending conservative management. PMH: CA, DM, a-fib, HTN   OT comments  Chart reviewed, pt greeted in bed, agreeable to OT tx session targeting improving functional activity tolerance, functional transfers in prep for toilet transfers. Pt continues to require MAX A +1-2 for transfer board transfer, poor sitting balance sitting on edge of bed with posterior lean noted. SET UP for grooming tasks. OT later assisted with transfer back to bed, requiring +2-3 with increased posterior lean noted requiring cueing for trunk flexion. OT will follow acutely.        If plan is discharge home, recommend the following:  A lot of help with walking and/or transfers;A lot of help with bathing/dressing/bathroom;Assistance with feeding;Assistance with cooking/housework;Assist for transportation;A little help with bathing/dressing/bathroom;Direct supervision/assist for medications management   Equipment Recommendations  Other (comment);Hospital bed (transfer board, hoyer lift/pad, drop arm bsc, mwc with removeable/arm rest/leg rests)    Recommendations for Other Services      Precautions / Restrictions Precautions Precautions: Fall Precaution Comments: Per order from orthopedic surgeon Aaron Short on 03/10/2023: "The patient is NWB on the bilateral LE. May have transfer training bed to chair with walker, slide board with minimal weight bearing." Restrictions Weight Bearing Restrictions: Yes RLE Weight Bearing: Non weight bearing LLE Weight  Bearing: Non weight bearing Other Position/Activity Restrictions: Per Dr. Rosann Short, previously ordered R hip abduction brace is not necessary.       Mobility Bed Mobility Overal bed mobility: Needs Assistance Bed Mobility: Supine to Sit, Sit to Supine Rolling: Mod assist, Max assist   Supine to sit: +2 for physical assistance, HOB elevated, Mod assist Sit to supine: Mod assist, Max assist, +2 for physical assistance, HOB elevated        Transfers Overall transfer level: Needs assistance Equipment used: Sliding board Transfers: Bed to chair/wheelchair/BSC            Lateral/Scoot Transfers: Max assist (+1-2)       Balance Overall balance assessment: Needs assistance, History of Falls Sitting-balance support: Feet supported, Bilateral upper extremity supported Sitting balance-Leahy Scale: Poor   Postural control: Posterior lean     Standing balance comment: NWBing BLE                           ADL either performed or assessed with clinical judgement   ADL Overall ADL's : Needs assistance/impaired      Grooming: SET UP                Lower Body Dressing: Maximal assistance   Toilet Transfer: Maximal assistance Toilet Transfer Details (indicate cue type and reason): +1-2 with transfer board to bedside chair, simulated Toileting- Clothing Manipulation and Hygiene: Maximal assistance              Extremity/Trunk Assessment              Vision       Perception     Praxis      Cognition Arousal: Alert Behavior During Therapy: [facility] for tasks assessed/performed Overall Cognitive  Status: Within Functional Limits for tasks assessed                                          Exercises      Shoulder Instructions       General Comments      Pertinent Vitals/ Pain       Pain Assessment Pain Assessment: 0-10 Pain Score: 0-No pain Pain Location: none at rest, 9 after mobility, nurse to address Pain Descriptors /  Indicators: Discomfort, Grimacing, Sore Pain Intervention(s): Monitored during session, Limited activity within patient's tolerance, Patient requesting pain meds-RN notified  Home Living                                          Prior Functioning/Environment              Frequency  Min 1X/week        Progress Toward Goals  OT Goals(current goals can now be found in the care plan section)  Progress towards OT goals: Progressing toward goals     Plan      Co-evaluation                 AM-PAC OT "6 Clicks" Daily Activity     Outcome Measure   Help from another person eating meals?: A Little Help from another person taking care of personal grooming?: A Little Help from another person toileting, which includes using toliet, bedpan, or urinal?: Total Help from another person bathing (including washing, rinsing, drying)?: A Lot Help from another person to put on and taking off regular upper body clothing?: A Lot Help from another person to put on and taking off regular lower body clothing?: A Lot 6 Click Score: 13    End of Session Equipment Utilized During Treatment: Gait belt;Other (comment) (transfer board)  OT Visit Diagnosis: Unsteadiness on feet (R26.81);Repeated falls (R29.6);Muscle weakness (generalized) (M62.81);Other abnormalities of gait and mobility (R26.89)   Activity Tolerance Patient tolerated treatment well   Patient Left in bed;with call bell/phone within reach;with chair alarm set   Nurse Communication Mobility status        Time: 1100-1114 OT Time Calculation (min): 14 min  Charges: OT General Charges $OT Visit: 1 Visit OT Treatments $Therapeutic Activity: 8-22 mins  Aaron Short, OTD OTR/L  03/17/23, 12:27 PM

## 2023-03-17 NOTE — TOC Progression Note (Signed)
Transition of Care [facility]) - Progression Note    Patient Details  Name: Aaron Short MRN: [id number] Date of Birth: [birth year]  Transition of Care [facility]) CM/SW Contact  Liliana Cline, LCSW Phone Number: 03/17/2023, 4:42 PM  Clinical Narrative:    CSW met with patient and wife at bedside. Patient's wife reports they have decided on home with hospice. Informed her that NP checked with [facility] Representative and most non profit hospice agencies do cover Midodrine. Provided list of Hospice agencies from Medicare.gov for patient's wife to review for preference. She states she will decide by tomorrow. She states they will need DME ordered. CSW will follow up tomorrow. Updated the Care Team.         Expected Discharge Plan and Services                                               Social Determinants of Health (SDOH) Interventions SDOH Screenings   Food Insecurity: No Food Insecurity (03/10/2023)  Housing: Low Risk  (03/10/2023)  Transportation Needs: No Transportation Needs (03/10/2023)  Utilities: Not At Risk (03/10/2023)  Depression (PHQ2-9): Low Risk  (10/12/2022)  Financial Resource Strain: Low Risk  (11/03/2022)  Physical Activity: Sufficiently Active (02/16/2021)   Received from [facility], [facility]  Social Connections: Moderately Integrated (02/16/2021)   Received from [facility], [facility]  Stress: No Stress Concern Present (11/03/2022)  Tobacco Use: Medium Risk (03/09/2023)  Health Literacy: Low Risk  (05/02/2021)   Received from [facility], [facility] Care    Readmission Risk Interventions     No data to display

## 2023-03-17 NOTE — Progress Notes (Signed)
Progress Note Patient: Aaron Short YQM:[id number] DOB: February 26, 1946 DOA: 03/09/2023     8 DOS: the patient was seen and examined on 03/17/2023   Brief hospital course: 77 y.o. male PMH of HTN, esophageal cancer, h/o pelvic and hip fracture, chronic pain syndrome, HLD, bladder cancer, smoker, Afib, vertebral fractures, DDD.   They presented to the local emergency department on 03/09/2023 after ground-level fall at home.   Orthopedics was consulted and based on the fracture pattern and the patient's overall medical condition nonoperative treatment was recommended.  Since admission, patient has been stable and evaluated by PT/OT who recommended SNF.  Family and patient are discussing goals and preferences for discharge at this time. He is considering comfort/hospice options. Appreciate palliative input.   Further hospital course and management as outlined below.   Assessment and Plan: Fall at home, initial encounter  acute closed left hip fracture  prior existing R hip fracture:  Attribute to combination of factors including low blood pressure, medications, deconditioning, recent right hip fracture. - Currently fall precautions - PT/OT - analgesia PRN -- added PO oxycodone, IV for breakthrough pain only, Robaxin PRN - conservative management, per ortho  Leukocytosis / Aspiration Pneumonia - initially felt reactive due to fracture.  WBC normalized to 9.6 on 10/12, but has increased 14.5 >> 15.2 today (10/17).   No fevers or overt s/sx's of infection. 10/17 - UA negative, CXR showed patchy bilateral opacities (infection vs inflammation - concern for aspiration given history).  --started [facility] 10/18 (PCN allergic) x 5 day course --monitor CBC  Chronic anemia : stable hgb 9.4 - PPI.  Hypertension, benign :   On midodrine for hypotension - continue   Atrial fibrillation and flutter  EKG changes A-fib with PVCs, low voltage, QTc of 395, nonspecific ST-T wave  changes. Continue Xarelto.   Esophageal cancer -- chemotherapy on hold due to significant functional decline --Palliative care following, ongoing GOC discussions --Pt and family deciding about going home with hospice   Severe calorie deficit malnutrition- contributing to falls and fractures. Poor PO intake due to low appetite, chronic nausea, dysphagia from complex history including esophageal cancer. Has worsened over past 3 months and had significant weight loss. Approached idea of possible PEG tube and he and son were appreciative of the idea but when later spoke with GI, he and wife declined the option.  - continue nutritional supplements --continue Marinol --Ativan or Zofran PRN nausea      Subjective: Pt seen with wife at bedside today.  Pt reports feeling fairly well.  Getting IV pain medication by RN.  Denies fever/chills.  Not dizzy or lightheaded, but has had soft BP's. Wife confirms pt baseline BP's tend to run low.  Physical Exam: Vitals:   03/16/23 2113 03/17/23 0909 03/17/23 1432 03/17/23 1636  BP: 102/62 (!) 90/56 94/63 (!) 103/55  Pulse: (!) 107 100  97  Resp:  16  16  Temp: 97.9 F (36.6 C) 97.6 F (36.4 C)  97.9 F (36.6 C)  TempSrc:  Oral  Oral  SpO2: 93% 98%  96%  Weight:      Height:        Physical Exam General exam: awake & alert, no acute distress, frail HEENT: moist mucus membranes, hearing grossly normal  Respiratory system: on room air, normal respiratory effort. Cardiovascular system: RRR,  no pedal edema.   Gastrointestinal system: soft, NT, ND Central nervous system: A&Ox3, normal speech but poor vocal projection, grossly non-focal exam Extremities: no edema, normal tone  Skin: dry, intact, normal temperature Psychiatry: normal mood & affect, judgment and insight appear intact   Data Reviewed:    Latest Ref Rng & Units 03/17/2023    6:20 AM 03/15/2023   10:28 AM 03/12/2023    5:26 AM  CBC  WBC 4.0 - 10.5 K/uL 13.4  15.2  14.5    Hemoglobin 13.0 - 17.0 g/dL 9.2  9.1  9.4   Hematocrit 39.0 - 52.0 % 28.3  27.8  28.0   Platelets 150 - 400 K/uL 321  353  371       Latest Ref Rng & Units 03/17/2023    6:20 AM 03/12/2023    5:26 AM 03/10/2023    4:57 AM  BMP  Glucose 70 - 99 mg/dL 295  93  87   BUN 8 - 23 mg/dL 12  11  12    Creatinine 0.61 - 1.24 mg/dL 2.84  1.32  4.40   Sodium 135 - 145 mmol/L 134  134  137   Potassium 3.5 - 5.1 mmol/L 4.2  4.0  3.8   Chloride 98 - 111 mmol/L 93  97  100   CO2 22 - 32 mmol/L 31  26  26    Calcium 8.9 - 10.3 mg/dL 8.4  8.4  8.2    Family Communication: Son at bedside on rounds this AM  Disposition: Status is: Inpatient Remains inpatient appropriate because: d/c planning and GOC discussions underway.  Need DME prior to d/c including hospital bed.   Planned Discharge Destination: HH vs home w hospice     Time spent: 35 minutes  Author: Pennie Banter, DO 03/17/2023 7:23 PM  For on call review [email or web address].

## 2023-03-17 NOTE — Plan of Care (Signed)

## 2023-03-18 DIAGNOSIS — W19XXXA Unspecified fall, initial encounter: Secondary | ICD-10-CM | POA: Diagnosis not present

## 2023-03-18 DIAGNOSIS — S72002A Fracture of unspecified part of neck of left femur, initial encounter for closed fracture: Secondary | ICD-10-CM | POA: Diagnosis not present

## 2023-03-18 DIAGNOSIS — Y92009 Unspecified place in unspecified non-institutional (private) residence as the place of occurrence of the external cause: Secondary | ICD-10-CM | POA: Diagnosis not present

## 2023-03-18 MED ORDER — MAGNESIUM CITRATE PO SOLN
1.0000 | Freq: Once | ORAL | Status: AC
  Administered 2023-03-18: 1 via ORAL
  Filled 2023-03-18: qty 296

## 2023-03-18 NOTE — TOC Progression Note (Signed)
Transition of Care [facility]) - Progression Note    Patient Details  Name: Aaron Short MRN: [id number] Date of Birth: 17-Sep-1945  Transition of Care [facility]) CM/SW Contact  Liliana Cline, LCSW Phone Number: 03/18/2023, 2:54 PM  Clinical Narrative:    Call from spouse and also received update from [facility] Representative. Patient's wife states she does not want patient to stop Xarelto and [facility] MD reported they do not cover this medicine. Patient's wife is agreeable to CSW reaching out to other hospice agencies to see if they cover this and the Midodrine that she wants patient to continue taking. She is also agreeable to CSW checking with Pharmacy to see way to get Xarelto for lowest out of pocket cost if no hospice agency covers it. She states she understands she may need to pay for it out of pocket if hospice does not cover it and if hospice is agreeable to this. Pharmacist contacted. Cala Bradford with [facility] and Efraim Kaufmann with [facility] contacted - awaiting responses.        Expected Discharge Plan and Services                                               Social Determinants of Health (SDOH) Interventions SDOH Screenings   Food Insecurity: No Food Insecurity (03/10/2023)  Housing: Low Risk  (03/10/2023)  Transportation Needs: No Transportation Needs (03/10/2023)  Utilities: Not At Risk (03/10/2023)  Depression (PHQ2-9): Low Risk  (10/12/2022)  Financial Resource Strain: Low Risk  (11/03/2022)  Physical Activity: Sufficiently Active (02/16/2021)   Received from [facility], [facility]  Social Connections: Moderately Integrated (02/16/2021)   Received from [facility], [facility]  Stress: No Stress Concern Present (11/03/2022)  Tobacco Use: Medium Risk (03/09/2023)  Health Literacy: Low Risk  (05/02/2021)   Received from [facility], [facility] Care    Readmission Risk Interventions     No data to display

## 2023-03-18 NOTE — Plan of Care (Signed)
  Problem: Education: Goal: Knowledge of General Education information will improve Description: Including pain rating scale, medication(s)/side effects and non-pharmacologic comfort measures Outcome: Progressing   Problem: Clinical Measurements: Goal: Diagnostic test results will improve Outcome: Progressing   Problem: Activity: Goal: Risk for activity intolerance will decrease Outcome: Progressing   Problem: Nutrition: Goal: Adequate nutrition will be maintained Outcome: Progressing   Problem: Coping: Goal: Level of anxiety will decrease Outcome: Progressing   Problem: Pain Managment: Goal: General experience of comfort will improve Outcome: Progressing   Problem: Skin Integrity: Goal: Risk for impaired skin integrity will decrease Outcome: Progressing

## 2023-03-18 NOTE — Plan of Care (Signed)

## 2023-03-18 NOTE — TOC Progression Note (Signed)
Transition of Care [facility]) - Progression Note    Patient Details  Name: Aaron Short MRN: [id number] Date of Birth: December 22, 1945  Transition [facility]) CM/SW Contact  Aaron Cline, LCSW Phone Number: 03/18/2023, 12:14 PM  Clinical Narrative:    Met with patient and son Aaron Short) at bedside, wife Aaron Short) present over speaker phone. Aaron Short states they have chosen Physic[facility], medical. She has specific questions about DME and medications - referral made to [place] with [facility] who will follow up at bedside today.         Expected Discharge Plan and Services                                               Social Determinants of Health (SDOH) Interventions SDOH Screenings   Food Insecurity: No Food Insecurity (03/10/2023)  Housing: Low Risk  (03/10/2023)  Transportation Needs: No Transportation Needs (03/10/2023)  Utilities: Not At Risk (03/10/2023)  Depression (PHQ2-9): Low Risk  (10/12/2022)  Financial Resource Strain: Low Risk  (11/03/2022)  Physical Activity: Sufficiently Active (02/16/2021)   Received from [facility], [facility]  Social Connections: Moderately Integrated (02/16/2021)   Received from [facility], [facility] Care  Stress: No Stress Concern Present (11/03/2022)  Tobacco Use: Medium Risk (03/09/2023)  Health Literacy: Low Risk  (05/02/2021)   Received from [facility], [facility] Care    Readmission Risk Interventions     No data to display

## 2023-03-18 NOTE — Progress Notes (Signed)
Civil engineer, contracting Hospice Referral  Received request from , [place], SW Transitions of Care Manager, for hospice services at home after discharge. Spoke with patient, Aaron Short, his son at bedside and with his wife, Aaron Short on the telephone to initiate education related to hospice philosophy, services, and team approach to care. Patient/family verbalized understanding of information given. Patient's wife, Aaron Short had a question regarding patient's medications.  Midodrine will be covered under the hospice benefit, but patient's Xarelto will not be covered.  After discussion, she would like to explore other hospice agencies to see if they will cover Xarelto.     Notified Lynnae Prude, SW [facility] and medical care team.  Hospital liaison team will follow pheripherally through final disposition.  Aaron Short has my cell number to call for any further hospice related questions.     If patient chooses our agency for care, discharge plan will be the following: Discharge home by EMS once DME has been delivered.  DME needs discussed.  Patient has the following equipment in the home:  2 wheeled walker and transport wheelchair, shower chair  Patient/family requests the following equipment for delivery:  hospital bed, over bed table, BSC          The address has been verified and is correct in the chart.  Aaron Short, wife, [phone]-  is the family contact to arrange time of equipment delivery.  Please send signed and completed DNR home with patient/family if applicable.   Please provide prescriptions at discharge as needed to ensure ongoing symptom management.  [facility] information and contact numbers given to Aaron Short, patient's son, who will take home and give to [facility].  Please call with any hospice related questions or concerns. Thank you for the opportunity to participate in this patient's care.  Norris Cross, RN Nurse Liaison [phone]

## 2023-03-18 NOTE — Progress Notes (Signed)
Progress Note Patient: Aaron Short ZOX:[id number] DOB: 1946/01/17 DOA: 03/09/2023     9 DOS: the patient was seen and examined on 03/18/2023   Brief hospital course: 77 y.o. male PMH of HTN, esophageal cancer, h/o pelvic and hip fracture, chronic pain syndrome, HLD, bladder cancer, smoker, Afib, vertebral fractures, DDD.   They presented to the local emergency department on 03/09/2023 after ground-level fall at home.   Orthopedics was consulted and based on the fracture pattern and the patient's overall medical condition nonoperative treatment was recommended.  Since admission, patient has been stable and evaluated by PT/OT who recommended SNF.  Family and patient are discussing goals and preferences for discharge at this time. He is considering comfort/hospice options. Appreciate palliative input.   Further hospital course and management as outlined below.   Assessment and Plan: Fall at home, initial encounter  acute closed left hip fracture  prior existing R hip fracture:  Attribute to combination of factors including low blood pressure, medications, deconditioning, recent right hip fracture. - Currently fall precautions - PT/OT - analgesia PRN -- added PO oxycodone, IV for breakthrough pain only, Robaxin PRN - conservative management, per ortho  Leukocytosis / Aspiration Pneumonia - initially felt reactive due to fracture.  WBC normalized to 9.6 on 10/12, but has increased 14.5 >> 15.2 (10/17).   No fevers or overt s/sx's of infection. 10/17 - UA negative, CXR showed patchy bilateral opacities (infection vs inflammation - concern for aspiration given history).  --started [facility] 10/18 (PCN allergic) x 5 day course --monitor CBC  Chronic anemia : stable hgb 9.4 - PPI.  Hypertension, benign :   On midodrine for hypotension - continue   Atrial fibrillation and flutter  EKG changes A-fib with PVCs, low voltage, QTc of 395, nonspecific ST-T wave changes. Continue  Xarelto.   Esophageal cancer -- chemotherapy on hold due to significant functional decline --Palliative care following, ongoing GOC discussions --Pt and family deciding about going home with hospice   Severe calorie deficit malnutrition- contributing to falls and fractures. Poor PO intake due to low appetite, chronic nausea, dysphagia from complex history including esophageal cancer. Has worsened over past 3 months and had significant weight loss. Approached idea of possible PEG tube and he and son were appreciative of the idea but when later spoke with GI, he and wife declined the option.  - continue nutritional supplements --continue Marinol --Ativan or Zofran PRN nausea      Subjective: Pt seen with son at bedside today.  Pt denies any acute complaints but is asking for pain medication.  Physical Exam: Vitals:   03/17/23 2118 03/18/23 0504 03/18/23 0800 03/18/23 1419  BP: (!) 97/59 (!) 98/57 (!) 111/59 94/60  Pulse: 100 100 (!) 102 94  Resp: 16  16 16   Temp: 98.4 F (36.9 C) 98.2 F (36.8 C) 98.2 F (36.8 C) 97.7 F (36.5 C)  TempSrc: Oral  Oral Oral  SpO2: 93% 91% 92% 96%  Weight:      Height:        Physical Exam General exam: awake, alert, no acute distress, frail HEENT: moist mucus membranes, hearing grossly normal  Respiratory system: on room air, normal respiratory effort. Cardiovascular system: RRR, no pedal edema.   Central nervous system: A&O x3. no gross focal neurologic deficits, normal speech Extremities: no edema, normal tone Skin: dry, intact, No rashes seen Psychiatry: normal mood, congruent affect, judgement and insight appear normal    Data Reviewed:    Latest Ref Rng &  Units 03/17/2023    6:20 AM 03/15/2023   10:28 AM 03/12/2023    5:26 AM  CBC  WBC 4.0 - 10.5 K/uL 13.4  15.2  14.5   Hemoglobin 13.0 - 17.0 g/dL 9.2  9.1  9.4   Hematocrit 39.0 - 52.0 % 28.3  27.8  28.0   Platelets 150 - 400 K/uL 321  353  371       Latest Ref Rng & Units  03/17/2023    6:20 AM 03/12/2023    5:26 AM 03/10/2023    4:57 AM  BMP  Glucose 70 - 99 mg/dL 409  93  87   BUN 8 - 23 mg/dL 12  11  12    Creatinine 0.61 - 1.24 mg/dL 8.11  9.14  7.82   Sodium 135 - 145 mmol/L 134  134  137   Potassium 3.5 - 5.1 mmol/L 4.2  4.0  3.8   Chloride 98 - 111 mmol/L 93  97  100   CO2 22 - 32 mmol/L 31  26  26    Calcium 8.9 - 10.3 mg/dL 8.4  8.4  8.2    Family Communication: Son at bedside on rounds this AM  Disposition: Status is: Inpatient Remains inpatient appropriate because: Need DME prior to d/c including hospital bed.  Family deciding on hospice agency.    Planned Discharge Destination: home w hospice     Time spent: 35 minutes  Author: Pennie Banter, DO 03/18/2023 5:13 PM  For on call review [email or web address].

## 2023-03-19 ENCOUNTER — Ambulatory Visit: Payer: Medicare Other | Admitting: Physician Assistant

## 2023-03-19 DIAGNOSIS — Y92009 Unspecified place in unspecified non-institutional (private) residence as the place of occurrence of the external cause: Secondary | ICD-10-CM | POA: Diagnosis not present

## 2023-03-19 DIAGNOSIS — E43 Unspecified severe protein-calorie malnutrition: Secondary | ICD-10-CM | POA: Diagnosis not present

## 2023-03-19 DIAGNOSIS — E860 Dehydration: Secondary | ICD-10-CM | POA: Diagnosis not present

## 2023-03-19 LAB — CBC
HCT: 28.8 % — ABNORMAL LOW (ref 39.0–52.0)
Hemoglobin: 9.4 g/dL — ABNORMAL LOW (ref 13.0–17.0)
MCH: 35.5 pg — ABNORMAL HIGH (ref 26.0–34.0)
MCHC: 32.6 g/dL (ref 30.0–36.0)
MCV: 108.7 fL — ABNORMAL HIGH (ref 80.0–100.0)
Platelets: 331 10*3/uL (ref 150–400)
RBC: 2.65 MIL/uL — ABNORMAL LOW (ref 4.22–5.81)
RDW: 15.9 % — ABNORMAL HIGH (ref 11.5–15.5)
WBC: 11.2 10*3/uL — ABNORMAL HIGH (ref 4.0–10.5)
nRBC: 0 % (ref 0.0–0.2)

## 2023-03-19 NOTE — Progress Notes (Signed)
[facility] Liaison Note  Follow up on new referral for hospice at home upon discharge from [facility]. Received a call from patient's wife, [name] this morning stating she would like to proceed with [facility] upon discharge from [facility].  DME ordered.  DME company will call [name] to set up delivery time. Hospital Liaison will notify hospital once DME has been delivered.  Patient's medical team notified of above including Deliliah Louvet, SW/TOC.  Thank you for allowing participation in this patient's care.  Norris Cross, RN Nurse Liaison [phone]

## 2023-03-19 NOTE — Care Management Important Message (Signed)
Important Message  Patient Details  Name: Onofre [place] MRN: [id number] Date of Birth: [birth year]   Important Message Given:  Yes - Medicare IM     Marlowe Sax, RN 03/19/2023, 10:56 AM

## 2023-03-19 NOTE — Progress Notes (Signed)
Physical Therapy Treatment Patient Details Name: Rodricus Dedrick MRN: [id number] DOB: [birth year] Today's Date: 03/19/2023   History of Present Illness Pt is a 77 y/o M admitted on 03/09/23 after presenting with c/o a fall. Pt also noted to have a fall on 02/28/23 as well. Pt with very minimally displaced R greater trochanter periprosthetic fx around R THA and now with L proximal anterior femur periprosthetic femur fx around the THA. Orthopedics recommending conservative management. PMH: CA, DM, a-fib, HTN    PT Comments  Patient is agreeable to PT session. He is hopeful to return home soon. Patient continues to require assistance for bed mobility. Sitting balance is poor initially, progressing to fair with increased sitting time. Encouraged sitting on edge of bed with assistance daily for upright conditioning and to increase strength and endurance for transfer training. Patient participate with LE exercise for strengthening. Recommend to continue PT to maximize independence and decrease caregiver burden. Patient will require assistance from caregivers if returning home.    If plan is discharge home, recommend the following: Two people to help with walking and/or transfers;Two people to help with bathing/dressing/bathroom;Assistance with cooking/housework;Direct supervision/assist for medications management;Direct supervision/assist for financial management;Assist for transportation;Help with stairs or ramp for entrance   Can travel by private vehicle     No  Equipment Recommendations  Rolling walker (2 wheels);BSC/3in1;Wheelchair (measurements PT);Wheelchair cushion (measurements PT);Hospital bed;Hoyer lift (bed side commode with drop arm)    Recommendations for Other Services       Precautions / Restrictions Precautions Precautions: Fall Precaution Comments: Per order from orthopedic surgeon [name] on 03/10/2023: "The patient is NWB on the bilateral LE. May have transfer  training bed to chair with walker, slide board with minimal weight bearing." Restrictions Weight Bearing Restrictions: Yes RLE Weight Bearing: Non weight bearing LLE Weight Bearing: Non weight bearing Other Position/Activity Restrictions: Per Dr. Rosann Auerbach, previously ordered R hip abduction brace is not necessary.     Mobility  Bed Mobility Overal bed mobility: Needs Assistance Bed Mobility: Rolling, Supine to Sit, Sit to Supine Rolling: Min assist   Supine to sit: Mod assist Sit to supine: Mod assist   General bed mobility comments: assistance for trunk and BLE support provided to sit upright. verbal cues for technique. increased time and effort required    Transfers                   General transfer comment: not assessed as patient requesting to return to bed after sitting up for several minutes    Ambulation/Gait                   Stairs             Wheelchair Mobility     Tilt Bed    Modified Rankin (Stroke Patients Only)       Balance Overall balance assessment: Needs assistance, History of Falls Sitting-balance support: Feet supported, Bilateral upper extremity supported Sitting balance-Leahy Scale: Poor Sitting balance - Comments: patient able to maintain sitting balance with Min A initially progressing to CGA with increased sitting time. patient does fatigue quickly. encouraged sitting up on edge of bed daily for upright conditioning and to build strength and endurance for transfer training                                    Cognition Arousal: Alert Behavior During Therapy: [facility] for tasks  assessed/performed Overall Cognitive Status: Within Functional Limits for tasks assessed                                          Exercises Total Joint Exercises Ankle Circles/Pumps: AROM, Strengthening, Both, 10 reps, Supine Quad Sets: AROM, Strengthening, Both, 10 reps, Supine Heel Slides: AAROM, Strengthening,  Both, 10 reps, Supine    General Comments        Pertinent Vitals/Pain Pain Assessment Pain Assessment: No/denies pain    Home Living                          Prior Function            PT Goals (current goals can now be found in the care plan section) Acute Rehab PT Goals Patient Stated Goal: get stronger and get home PT Goal Formulation: With patient Time For Goal Achievement: 03/25/23 Potential to Achieve Goals: Fair Progress towards PT goals: Progressing toward goals    Frequency    Min 1X/week      PT Plan      Co-evaluation              AM-PAC PT "6 Clicks" Mobility   Outcome Measure  Help needed turning from your back to your side while in a flat bed without using bedrails?: A Lot Help needed moving from lying on your back to sitting on the side of a flat bed without using bedrails?: A Lot Help needed moving to and from a bed to a chair (including a wheelchair)?: Total Help needed standing up from a chair using your arms (e.g., wheelchair or bedside chair)?: Total Help needed to walk in hospital room?: Total Help needed climbing 3-5 steps with a railing? : Total 6 Click Score: 8    End of Session   Activity Tolerance: Patient tolerated treatment well;Patient limited by fatigue Patient left: in bed;with call bell/phone within reach;with bed alarm set   PT Visit Diagnosis: Muscle weakness (generalized) (M62.81);History of falling (Z91.81);Difficulty in walking, not elsewhere classified (R26.2);Pain;Other abnormalities of gait and mobility (R26.89)     Time: 1100-1119 PT Time Calculation (min) (ACUTE ONLY): 19 min  Charges:    $Therapeutic Activity: 8-22 mins PT General Charges $$ ACUTE PT VISIT: 1 Visit                     Donna Bernard, PT, MPT    Ina Homes 03/19/2023, 2:06 PM

## 2023-03-19 NOTE — TOC Progression Note (Signed)
Transition of Care [facility]) - Progression Note    Patient Details  Name: Aaron Short MRN: [id number] Date of Birth: 01-06-1946  Transition of Care [facility]) CM/SW Contact  Marlowe Sax, RN Phone Number: 03/19/2023, 10:26 AM  Clinical Narrative:     Spoke with the patient's wife, she has decided to go with [facility], Diannia Ruder contacted from [facility] and will get the DME ordered then will go home       Expected Discharge Plan and Services                                               Social Determinants of Health (SDOH) Interventions SDOH Screenings   Food Insecurity: No Food Insecurity (03/10/2023)  Housing: Low Risk  (03/10/2023)  Transportation Needs: No Transportation Needs (03/10/2023)  Utilities: Not At Risk (03/10/2023)  Depression (PHQ2-9): Low Risk  (10/12/2022)  Financial Resource Strain: Low Risk  (11/03/2022)  Physical Activity: Sufficiently Active (02/16/2021)   Received from [name], [facility]  Social Connections: Moderately Integrated (02/16/2021)   Received from [facility], [facility] Care  Stress: No Stress Concern Present (11/03/2022)  Tobacco Use: Medium Risk (03/09/2023)  Health Literacy: Low Risk  (05/02/2021)   Received from [facility], [facility] Care    Readmission Risk Interventions     No data to display

## 2023-03-19 NOTE — Progress Notes (Signed)
Palliative Care Progress Note   Patient Name: Aaron Short       Date: 03/19/2023 DOB: [birth year]  Age: 77 y.o. MRN#: [id number] Attending Physician: Pennie Banter, DO Primary Care Physician: Rosemarie Ax, MD Admit Date: 03/09/2023  Chart reviewed.  Family has decided to go home with hospice services.  TOC following closely for disposition planning.  No acute palliative needs today.  PMT remains available to patient and family throughout his hospitalization.  Please reengage with PMT if goals change, at patient/family's request, or if patient's health deteriorates during hospitalization.  Thank you for allowing the Palliative Medicine Team to assist in the care of [facility].  Samara Deist L. Bonita Quin, DNP, FNP-BC Palliative Medicine Team    No charge

## 2023-03-19 NOTE — Plan of Care (Signed)

## 2023-03-19 NOTE — Progress Notes (Signed)
Progress Note Patient: Aaron Short XBM:[id number] DOB: Nov 24, 1945 DOA: 03/09/2023     10 DOS: the patient was seen and examined on 03/19/2023   Brief hospital course: 77 y.o. male PMH of HTN, esophageal cancer, h/o pelvic and hip fracture, chronic pain syndrome, HLD, bladder cancer, smoker, Afib, vertebral fractures, DDD.   They presented to the local emergency department on 03/09/2023 after ground-level fall at home.   Orthopedics was consulted and based on the fracture pattern and the patient's overall medical condition nonoperative treatment was recommended.  Since admission, patient has been stable and evaluated by PT/OT who recommended SNF.  Family and patient are discussing goals and preferences for discharge at this time. He is considering comfort/hospice options. Appreciate palliative input.   Further hospital course and management as outlined below.   Assessment and Plan: Fall at home, initial encounter  acute closed left hip fracture  prior existing R hip fracture:  Attribute to combination of factors including low blood pressure, medications, deconditioning, recent right hip fracture. - Currently fall precautions - PT/OT - analgesia PRN -- added PO oxycodone, IV for breakthrough pain only, Robaxin PRN - conservative management, per ortho  Leukocytosis / Aspiration Pneumonia - initially felt reactive due to fracture.   WBC normalized to 9.6 on 10/12, but increased 14.5 >> 15.2 (10/17).   10/17 - UA negative, CXR showed patchy bilateral opacities (infection vs inflammation - concern for aspiration given history).  --started [facility] 10/18 (PCN allergic) x 5 day course --monitor CBC - WBC improving  Chronic anemia : stable hgb 9.4 - PPI.  Hypertension, benign :   On midodrine for hypotension - continue   Atrial fibrillation and flutter  EKG changes A-fib with PVCs, low voltage, QTc of 395, nonspecific ST-T wave changes. Continue Xarelto.   Esophageal  cancer -- chemotherapy on hold due to significant functional decline --Palliative care following, ongoing GOC discussions --Pt and family deciding about going home with hospice   Severe calorie deficit malnutrition- contributing to falls and fractures. Poor PO intake due to low appetite, chronic nausea, dysphagia from complex history including esophageal cancer. Has worsened over past 3 months and had significant weight loss. Approached idea of possible PEG tube and he and son were appreciative of the idea but when later spoke with GI, he and wife declined the option.  - continue nutritional supplements --continue Marinol --Ativan or Zofran PRN nausea      Subjective: Pt seen with son at bedside today. Pt reports he feels well.  He denies any acute complaints. Son reports loose stools since mag citrate was given yesterday.    Physical Exam: Vitals:   03/18/23 1419 03/18/23 2152 03/19/23 0550 03/19/23 0920  BP: 94/60 (!) 92/59 96/61 (!) 101/56  Pulse: 94 94 99 96  Resp: 16 16  18   Temp: 97.7 F (36.5 C) 97.7 F (36.5 C) 97.6 F (36.4 C) 98 F (36.7 C)  TempSrc: Oral Oral Oral   SpO2: 96% 94% 91% 98%  Weight:      Height:        Physical Exam General exam: awake, alert, no acute distress, frail HEENT: moist mucus membranes, hearing grossly normal  Respiratory system: on room air, normal respiratory effort. Cardiovascular system: RRR, no pedal edema.   Central nervous system: A&O x3. no gross focal neurologic deficits, normal speech Extremities: no edema, normal tone Skin: dry, intact, No rashes seen Psychiatry: normal mood, congruent affect, judgement and insight appear normal    Data Reviewed:  Latest Ref Rng & Units 03/19/2023    5:39 AM 03/17/2023    6:20 AM 03/15/2023   10:28 AM  CBC  WBC 4.0 - 10.5 K/uL 11.2  13.4  15.2   Hemoglobin 13.0 - 17.0 g/dL 9.4  9.2  9.1   Hematocrit 39.0 - 52.0 % 28.8  28.3  27.8   Platelets 150 - 400 K/uL 331  321  353        Latest Ref Rng & Units 03/17/2023    6:20 AM 03/12/2023    5:26 AM 03/10/2023    4:57 AM  BMP  Glucose 70 - 99 mg/dL 629  93  87   BUN 8 - 23 mg/dL 12  11  12    Creatinine 0.61 - 1.24 mg/dL 5.28  4.13  2.44   Sodium 135 - 145 mmol/L 134  134  137   Potassium 3.5 - 5.1 mmol/L 4.2  4.0  3.8   Chloride 98 - 111 mmol/L 93  97  100   CO2 22 - 32 mmol/L 31  26  26    Calcium 8.9 - 10.3 mg/dL 8.4  8.4  8.2    Family Communication: Son at bedside on rounds this AM  Disposition: Status is: Inpatient Remains inpatient appropriate because: Need DME prior to d/c including hospital bed.     Planned Discharge Destination: home w hospice     Time spent: 35 minutes  Author: Pennie Banter, DO 03/19/2023 2:05 PM  For on call review [email or web address].

## 2023-03-20 DIAGNOSIS — E860 Dehydration: Secondary | ICD-10-CM | POA: Diagnosis not present

## 2023-03-20 DIAGNOSIS — W19XXXA Unspecified fall, initial encounter: Secondary | ICD-10-CM | POA: Diagnosis not present

## 2023-03-20 DIAGNOSIS — E43 Unspecified severe protein-calorie malnutrition: Secondary | ICD-10-CM | POA: Diagnosis not present

## 2023-03-20 DIAGNOSIS — Y92009 Unspecified place in unspecified non-institutional (private) residence as the place of occurrence of the external cause: Secondary | ICD-10-CM | POA: Diagnosis not present

## 2023-03-20 NOTE — Progress Notes (Signed)
Nutrition Brief Note  Chart reviewed. Pt to discharge home with hospice services. Pt functional decline and has declined PEG/ feeding tube.  No further nutrition interventions planned at this time.  Please re-consult as needed.   Levada Schilling, RD, LDN, CDCES Registered Dietitian III Certified Diabetes Care and Education Specialist Please refer to [facility] for RD and/or RD on-call/weekend/after hours pager

## 2023-03-20 NOTE — Progress Notes (Signed)
[facility]- [employer]  DME delivery to be between 10-1pm tomorrow.  Nursing visit scheduled for 2pm tomorrow afternoon.    Please don't hesitate to call with any Hospice related questions or concerns.    Thank you for the opportunity to participate in this patient's care. [facility] Liaison [phone]

## 2023-03-20 NOTE — Progress Notes (Addendum)
Progress Note Patient: Aaron Short ZOX:[id number] DOB: May 29, 1946 DOA: 03/09/2023     11 DOS: the patient was seen and examined on 03/20/2023   Brief hospital course: 77 y.o. male PMH of HTN, esophageal cancer, h/o pelvic and hip fracture, chronic pain syndrome, HLD, bladder cancer, smoker, Afib, vertebral fractures, DDD.   They presented to the local emergency department on 03/09/2023 after ground-level fall at home.   Orthopedics was consulted and based on the fracture pattern and the patient's overall medical condition nonoperative treatment was recommended.  Since admission, patient has been stable and evaluated by PT/OT who recommended SNF.  Family and patient are discussing goals and preferences for discharge at this time. He is considering comfort/hospice options. Appreciate palliative input.   Further hospital course and management as outlined below.   Assessment and Plan: Fall at home, initial encounter  acute closed left hip fracture  prior existing R hip fracture:  Attribute to combination of factors including low blood pressure, medications, deconditioning, recent right hip fracture. - Currently fall precautions - PT/OT - analgesia PRN -- added PO oxycodone, IV for breakthrough pain only, Robaxin PRN - conservative management, per ortho  Leukocytosis / Aspiration Pneumonia - initially felt reactive due to fracture.   WBC normalized to 9.6 on 10/12, but increased 14.5 >> 15.2 (10/17).   10/17 - UA negative, CXR showed patchy bilateral opacities (infection vs inflammation - concern for aspiration given history).  --started [facility] 10/18 (PCN allergic) x 5 day course --monitor CBC - WBC improving   Esophageal cancer -- chemotherapy on hold due to significant functional decline --Palliative care following, ongoing GOC discussions --Plan is d/c home with hospice - [facility] to follow  Severe calorie deficit malnutrition- contributing to falls and fractures.  Poor PO intake due to low appetite, chronic nausea, dysphagia from complex history including esophageal cancer. Has worsened over past 3 months and had significant weight loss. Approached idea of possible PEG tube and he and son were appreciative of the idea but when later spoke with GI, he and wife declined the option.  - continue nutritional supplements --continue Marinol --Ativan or Zofran PRN nausea   Chronic anemia : stable hgb 9.4 - PPI.  Hypertension, benign :   On midodrine for hypotension - continue   Atrial fibrillation and flutter  EKG changes A-fib with PVCs, low voltage, QTc of 395, nonspecific ST-T wave changes. Continue Xarelto.      Subjective: Pt seen awake resting in bed. States he is feeling well and denies complaints. Eager to get home.  Physical Exam: Vitals:   03/19/23 1529 03/19/23 2354 03/20/23 0725 03/20/23 1412  BP: (!) 95/59 109/71 100/62 96/62  Pulse: 99 (!) 102 96 98  Resp: 18 16 14 14   Temp: 97.8 F (36.6 C) 97.9 F (36.6 C) 98.1 F (36.7 C) 97.7 F (36.5 C)  TempSrc:      SpO2: 97% 98% 95% 98%  Weight:      Height:        Physical Exam General exam: awake, alert, no acute distress, frail HEENT: moist mucus membranes, hearing grossly normal  Respiratory system: on room air, normal respiratory effort. Cardiovascular system: RRR, no pedal edema.   Central nervous system: A&O x3. no gross focal neurologic deficits, normal speech Extremities: no edema, normal tone Skin: dry, intact, No rashes seen Psychiatry: normal mood, congruent affect, judgement and insight appear normal    Data Reviewed:    Latest Ref Rng & Units 03/19/2023    5:39  AM 03/17/2023    6:20 AM 03/15/2023   10:28 AM  CBC  WBC 4.0 - 10.5 K/uL 11.2  13.4  15.2   Hemoglobin 13.0 - 17.0 g/dL 9.4  9.2  9.1   Hematocrit 39.0 - 52.0 % 28.8  28.3  27.8   Platelets 150 - 400 K/uL 331  321  353       Latest Ref Rng & Units 03/17/2023    6:20 AM 03/12/2023    5:26 AM  03/10/2023    4:57 AM  BMP  Glucose 70 - 99 mg/dL 253  93  87   BUN 8 - 23 mg/dL 12  11  12    Creatinine 0.61 - 1.24 mg/dL 6.64  4.03  4.74   Sodium 135 - 145 mmol/L 134  134  137   Potassium 3.5 - 5.1 mmol/L 4.2  4.0  3.8   Chloride 98 - 111 mmol/L 93  97  100   CO2 22 - 32 mmol/L 31  26  26    Calcium 8.9 - 10.3 mg/dL 8.4  8.4  8.2     Family Communication: none present on rounds, will attempt to call as time allows. Son and/or wife have been present prior mornings on rounds. No medical updates at this time.   Disposition: Status is: Inpatient Remains inpatient appropriate because: Need DME prior to d/c including hospital bed.   Delivery scheduled for tomorrow, expect d/c tomorrow 10/23    Planned Discharge Destination: home w hospice     Time spent: 25 minutes  Author: Pennie Banter, DO 03/20/2023 5:04 PM  For on call review [email or web address].

## 2023-03-21 DIAGNOSIS — W19XXXA Unspecified fall, initial encounter: Secondary | ICD-10-CM | POA: Diagnosis not present

## 2023-03-21 DIAGNOSIS — Z515 Encounter for palliative care: Secondary | ICD-10-CM

## 2023-03-21 DIAGNOSIS — Y92009 Unspecified place in unspecified non-institutional (private) residence as the place of occurrence of the external cause: Secondary | ICD-10-CM | POA: Diagnosis not present

## 2023-03-21 DIAGNOSIS — M9701XA Periprosthetic fracture around internal prosthetic right hip joint, initial encounter: Secondary | ICD-10-CM

## 2023-03-21 DIAGNOSIS — E43 Unspecified severe protein-calorie malnutrition: Secondary | ICD-10-CM | POA: Diagnosis not present

## 2023-03-21 DIAGNOSIS — S72002A Fracture of unspecified part of neck of left femur, initial encounter for closed fracture: Secondary | ICD-10-CM | POA: Diagnosis not present

## 2023-03-21 MED ORDER — HYDROMORPHONE HCL 2 MG PO TABS: 4.0000 mg | ORAL_TABLET | ORAL | 0 refills | Status: AC | PRN

## 2023-03-21 MED ORDER — POLYETHYLENE GLYCOL 3350 17 G PO PACK: 17.0000 g | PACK | Freq: Every day | ORAL | 0 refills | Status: AC

## 2023-03-21 MED ORDER — HEPARIN SOD (PORK) LOCK FLUSH 100 UNIT/ML IV SOLN
500.0000 [IU] | Freq: Once | INTRAVENOUS | Status: AC
  Administered 2023-03-21: 500 [IU] via INTRAVENOUS
  Filled 2023-03-21 (×2): qty 5

## 2023-03-21 MED ORDER — PROCHLORPERAZINE MALEATE 10 MG PO TABS: 10.0000 mg | ORAL_TABLET | Freq: Four times a day (QID) | ORAL | 0 refills | Status: AC | PRN

## 2023-03-21 MED ORDER — LORAZEPAM 0.5 MG PO TABS: 0.5000 mg | ORAL_TABLET | Freq: Three times a day (TID) | ORAL | 0 refills | Status: AC | PRN

## 2023-03-21 NOTE — Care Management Important Message (Signed)
Important Message  Patient Details  Name: Aaron Short MRN: [id number] Date of Birth: 02/10/1946   Important Message Given:  No (Going home with hospice)     Margarito Liner, LCSW 03/21/2023, 1:16 PM

## 2023-03-21 NOTE — TOC Transition Note (Signed)
Transition of Care [facility]) - CM/SW Discharge Note   Patient Details  Name: Aaron Short MRN: [id number] Date of Birth: March 21, 1946  Transition of Care [facility]) CM/SW Contact:  Margarito Liner, LCSW Phone Number: 03/21/2023, 1:17 PM   Clinical Narrative:   Patient has orders to discharge home with hospice today. [facility] liaison is aware. Wife called and confirmed DME had been delivered. EMS transport has been arranged. There are several on the list in front of him. CSW asked RN to call wife when they arrive. No further concerns. CSW signing off.  Final next level of care: Home w Hospice Care Barriers to Discharge: Barriers Resolved   Patient Goals and CMS Choice      Discharge Placement                  Patient to be transferred to facility by: EMS Name of family member notified: Sherre Poot Patient and family notified of of transfer: 03/21/23  Discharge Plan and Services Additional resources added to the After Visit Summary for                                       Social Determinants of Health (SDOH) Interventions SDOH Screenings   Food Insecurity: No Food Insecurity (03/10/2023)  Housing: Low Risk  (03/10/2023)  Transportation Needs: No Transportation Needs (03/10/2023)  Utilities: Not At Risk (03/10/2023)  Depression (PHQ2-9): Low Risk  (10/12/2022)  Financial Resource Strain: Low Risk  (11/03/2022)  Physical Activity: Sufficiently Active (02/16/2021)   Received from [facility], [facility]  Social Connections: Moderately Integrated (02/16/2021)   Received from [facility], [facility]  Stress: No Stress Concern Present (11/03/2022)  Tobacco Use: Medium Risk (03/09/2023)  Health Literacy: Low Risk  (05/02/2021)   Received from [facility], [facility] Care     Readmission Risk Interventions     No data to display

## 2023-03-21 NOTE — Progress Notes (Signed)
Occupational Therapy Treatment Patient Details Name: Aaron Short MRN: [id number] DOB: 1945/11/28 Today's Date: 03/21/2023   History of present illness Pt is a 77 y/o M admitted on 03/09/23 after presenting with c/o a fall. Pt also noted to have a fall on 02/28/23 as well. Pt with very minimally displaced R greater trochanter periprosthetic fx around R THA and now with L proximal anterior femur periprosthetic femur fx around the THA. Orthopedics recommending conservative management. PMH: CA, DM, a-fib, HTN   OT comments  Upon entering the room, pt supine in bed with son present in room. Pt is pleasant and agreeable to OT intervention from bed level. He declines bed mobility secondary to knee pain. Pt placed into chair position with bed functions and needing set up A to brush teeth and wash face. Pt does fatigue quickly during session. Session terminated secondary to MD arriving to room to assess pt. All needs within reach and son remains in room.       If plan is discharge home, recommend the following:  A lot of help with walking and/or transfers;A lot of help with bathing/dressing/bathroom;Assistance with feeding;Assistance with cooking/housework;Assist for transportation;A little help with bathing/dressing/bathroom;Direct supervision/assist for medications management   Equipment Recommendations  Hospital bed       Precautions / Restrictions Precautions Precautions: Fall Precaution Comments: Per order from orthopedic surgeon [name] on 03/10/2023: "The patient is NWB on the bilateral LE. May have transfer training bed to chair with walker, slide board with minimal weight bearing." Restrictions Weight Bearing Restrictions: Yes RLE Weight Bearing: Non weight bearing LLE Weight Bearing: Non weight bearing       Mobility Bed Mobility               General bed mobility comments: pt declined    Transfers                   General transfer comment: pt  declined         ADL either performed or assessed with clinical judgement   ADL Overall ADL's : Needs assistance/impaired     Grooming: Bed level;Set up;Supervision/safety;Wash/dry hands;Wash/dry face;Oral care                                        Cognition Arousal: Alert Behavior During Therapy: WFL for tasks assessed/performed Overall Cognitive Status: Within Functional Limits for tasks assessed                                                     Pertinent Vitals/ Pain       Pain Assessment Pain Assessment: Faces Faces Pain Scale: Hurts a little bit Pain Location: generalized Pain Descriptors / Indicators: Discomfort Pain Intervention(s): Monitored during session   Frequency  Min 1X/week        Progress Toward Goals  OT Goals(current goals can now be found in the care plan section)  Progress towards OT goals: Progressing toward goals      AM-PAC OT "6 Clicks" Daily Activity     Outcome Measure   Help from another person eating meals?: A Little Help from another person taking care of personal grooming?: A Little Help from another person toileting, which includes using toliet, bedpan, or urinal?:  Total Help from another person bathing (including washing, rinsing, drying)?: A Lot Help from another person to put on and taking off regular upper body clothing?: A Lot   6 Click Score: 11    End of Session    OT Visit Diagnosis: Unsteadiness on feet (R26.81);Repeated falls (R29.6);Muscle weakness (generalized) (M62.81);Other abnormalities of gait and mobility (R26.89)   Activity Tolerance Patient tolerated treatment well   Patient Left in bed;with call bell/phone within reach;with chair alarm set;with family/visitor present   Nurse Communication          Time: 8413-2440 OT Time Calculation (min): 14 min  Charges: OT General Charges $OT Visit: 1 Visit OT Treatments $Self Care/Home Management : 8-22  mins  Jackquline Denmark, MS, OTR/L , CBIS ascom [phone]  03/21/23, 11:29 AM

## 2023-03-21 NOTE — TOC Progression Note (Addendum)
Transition of Care [facility]) - Progression Note    Patient Details  Name: Aaron Short MRN: [id number] Date of Birth: [birth year]  [facility]) CM/SW Contact  Margarito Liner, LCSW Phone Number: 03/21/2023, 9:57 AM  Clinical Narrative:   CSW called wife to notify her that DME will be delivered sometime between 10-1 and that RN will come to the home for admission around 2:00. Wife is agreeable. Wife will call CSW when DME is set up so that EMS transport can be arranged. Address on facesheet is correct. Wife is aware that they will likely receive a bill for transport. Wife also asked for a wheelchair to be delivered to the home. CSW notified the [facility] liaison. MD is aware of plan.  Expected Discharge Plan and Services                                               Social Determinants of Health (SDOH) Interventions SDOH Screenings   Food Insecurity: No Food Insecurity (03/10/2023)  Housing: Low Risk  (03/10/2023)  Transportation Needs: No Transportation Needs (03/10/2023)  Utilities: Not At Risk (03/10/2023)  Depression (PHQ2-9): Low Risk  (10/12/2022)  Financial Resource Strain: Low Risk  (11/03/2022)  Physical Activity: Sufficiently Active (02/16/2021)   Received from [facility], [facility]  Social Connections: Moderately Integrated (02/16/2021)   Received from [facility], [facility]  Stress: No Stress Concern Present (11/03/2022)  Tobacco Use: Medium Risk (03/09/2023)  Health Literacy: Low Risk  (05/02/2021)   Received from [facility], [facility]    Readmission Risk Interventions     No data to display

## 2023-03-21 NOTE — Discharge Summary (Signed)
Physician Discharge Summary   Patient: Aaron Short MRN: [id number] DOB: 04/18/1946  Admit date:     03/09/2023  Discharge date: 03/21/23  Discharge Physician: Kathyleen Radice   PCP: Rosemarie Ax, MD   Recommendations at discharge:   Patient is being discharged home with hospice  Discharge Diagnoses: Principal Problem:   Fall at home, initial encounter Active Problems:   Closed left hip fracture, initial encounter [facility])   Atrial fibrillation and flutter (HCC)   Hypertension, benign   Esophageal adenocarcinoma (HCC)   Dysphagia   Chronic hip pain after THR (Bilateral)   History of bladder cancer   Chemotherapy-induced nausea   Bladder cancer (HCC)   Chronic anemia   Dehydration   Protein-calorie malnutrition, severe (HCC)   Closed fracture of left hip (HCC)   Fall   Pressure injury of skin   Hospice care patient   Palliative care patient   Periprosthetic fracture around internal prosthetic right hip joint (HCC)  Resolved Problems:   * No resolved hospital problems. *  Hospital Course:  Aaron Short is a 77 y.o. male with medical history significant for esophageal adenocarcinoma, chronic pain, recent right periprosthetic hip fracture, diabetes mellitus type 2, atrial fibrillation on Xarelto, hypertension, patient brought by EMS with complaints of fall  leading to left hip pain which prompted patient to present to the ER.  Patient was just discharged on the sixth after right periprosthetic fracture which was managed conservatively.   Patient was in the kitchen and turned around felt dizzy and fell landing on his hip on the left side..  Patient reported pain and was unable to bear weight. No reports of chest pain palpitation dizziness or trauma to the head. In ED vitals show tachycardia with a heart rate of 120 blood pressure 99/67 O2 sats of 98% on room air. Labs notable for normal electrolytes, except for glucose of 135 normal kidney function,  alk phos elevated at 177 albumin 2.6 AST of 62 which I suspect may be due to mild rhabdo from his fall we will check a CPK and add on, normal ALT, troponin of 22 x 2. CBC shows leukocytosis of 11.8 hemoglobin of 9.2 which is chronic, MCV 107.9 RDW of 16.6 and normal platelets.     Assessment and Plan:  Fall at home, initial encounter  acute closed left hip fracture  prior existing R hip fracture:  Attribute to combination of factors including low blood pressure, medications, deconditioning, recent right hip fracture. - conservative management, per ortho Patient will be discharged home with home hospice   Leukocytosis / Aspiration Pneumonia -  initially felt reactive due to fracture.   WBC normalized to 9.6 on 10/12, but increased 14.5 >> 15.2 (10/17).   10/17 - UA negative, CXR showed patchy bilateral opacities (infection vs inflammation - concern for aspiration given history).  Completed a 5-day course of Omnicef 10/18 (PCN allergic)       Esophageal cancer -- chemotherapy on hold due to significant functional decline --Palliative care following,  --Plan is d/c home with hospice - [facility] to follow   Severe calorie deficit malnutrition- contributing to falls and fractures. Poor PO intake due to low appetite, chronic nausea, dysphagia from complex history including esophageal cancer. Has worsened over past 3 months and had significant weight loss. Approached idea of possible PEG tube and he and son were appreciative of the idea but when later spoke with GI, he and wife declined the option.  - continue nutritional supplements --continue Marinol --  Ativan or Zofran PRN nausea     Chronic anemia : stable hgb 9.4 - PPI.   Hypertension, benign :   On midodrine for hypotension - continue    Atrial fibrillation and flutter  Continue Xarelto.      Patient was seen and examined at the bedside prior to discharge home with hospice.  Discussed with his wife and son prior to discharge  and all questions and concerns have been addressed.        Consultants: Palliative care Procedures performed: None Disposition: Hospice care Diet recommendation:  Discharge Diet Orders (From admission, onward)     Start     Ordered   03/21/23 0000  Diet - low sodium heart healthy        03/21/23 1117           Regular diet DISCHARGE MEDICATION: Allergies as of 03/21/2023       Reactions   Diltiazem Swelling   Leg edema Leg edema Leg edema Leg edema Leg edema Leg edema Leg edema Leg edema Leg edema   Penicillins Hives, Rash   Had hives as child Other reaction(s): RASH Childhood allergy Other reaction(s): RASH Other reaction(s): RASH Childhood allergy Other reaction(s): RASH Other reaction(s): RASH Childhood allergy   Tizanidine Other (See Comments)   Other reaction(s): OTHER  Pt states it puts him out Other reaction(s): OTHER  Pt states it puts him out Other reaction(s): OTHER  Pt states it puts him out Other reaction(s): OTHER  Pt states it puts him out Other reaction(s): OTHER  Pt states it puts him out Other reaction(s): OTHER  Pt states it puts him out        Medication List     STOP taking these medications    dexamethasone 4 MG tablet Commonly known as: DECADRON       TAKE these medications    carisoprodol 350 MG tablet Commonly known as: SOMA Take 350 mg by mouth 2 (two) times daily.   cetirizine 10 MG tablet Commonly known as: ZYRTEC Take 10 mg by mouth 2 (two) times daily.   dronabinol 2.5 MG capsule Commonly known as: MARINOL Take 1 capsule (2.5 mg total) by mouth 2 (two) times daily before a meal.   HYDROmorphone 2 MG tablet Commonly known as: Dilaudid Take 2-3 tablets (4-6 mg total) by mouth every 4 (four) hours as needed for up to 5 days for severe pain (pain score 7-10).   lidocaine-prilocaine cream Commonly known as: EMLA Apply to affected area once   loperamide 2 MG capsule Commonly known as: IMODIUM Take 1  capsule (2 mg total) by mouth See admin instructions. Initial: 4 mg,the 2 mg every 2 hours (4 mg every 4 hours at night)  maximum: 16 mg/day   LORazepam 0.5 MG tablet Commonly known as: ATIVAN Take 1 tablet (0.5 mg total) by mouth every 8 (eight) hours as needed for up to 5 days for anxiety (nausea). What changed: reasons to take this   magic mouthwash w/lidocaine Soln Take 5 mLs by mouth 4 (four) times daily as needed for mouth pain. Sig: Swish/Swallow 5-10 ml four times a day as needed. Dispense 480 ml. 1RF   Melatonin Maximum Strength 5 MG Tabs Generic drug: melatonin Take 5 mg by mouth at bedtime as needed.   midodrine 5 MG tablet Commonly known as: PROAMATINE Take 1 tablet (5 mg total) by mouth 3 (three) times daily with meals.   naloxone 4 MG/0.1ML Liqd nasal spray kit Commonly known as:  Narcan Place 1 spray into the nose once for 1 dose. Spray half of bottle content into each nostril, then call 911   nystatin 100000 UNIT/ML suspension Commonly known as: MYCOSTATIN Take 5 mLs (500,000 Units total) by mouth 4 (four) times daily.   omeprazole 10 MG capsule Commonly known as: PRILOSEC Take 20 mg by mouth 3 (three) times daily as needed.   ondansetron 8 MG tablet Commonly known as: Zofran Take 1 tablet (8 mg total) by mouth every 8 (eight) hours as needed for nausea or vomiting. Start on the third day after chemotherapy.   polyethylene glycol 17 g packet Commonly known as: MiraLax Take 17 g by mouth daily.   prochlorperazine 10 MG tablet Commonly known as: COMPAZINE Take 1 tablet (10 mg total) by mouth every 6 (six) hours as needed for up to 5 days for nausea or vomiting.   rivaroxaban 20 MG Tabs tablet Commonly known as: XARELTO Take 20 mg by mouth daily.   senna-docusate 8.6-50 MG tablet Commonly known as: Senokot-S Take 1 tablet by mouth at bedtime as needed for mild constipation.   sertraline 100 MG tablet Commonly known as: ZOLOFT Take 100 mg by mouth at  bedtime.               Discharge Care Instructions  (From admission, onward)           Start     Ordered   03/21/23 0000  Discharge wound care:       Comments: Wound care  Every other day    Comments: Clean L upper arm, L elbow and R elbow wounds with NS, apply Xeroform gauze to wound beds every other day, cover with Telfa non-stick pad and either silicone foam or Kerlix roll gauze to secure.    Wound care  Daily      Comments: Clean coccyx ulcers with NS, apply Medihoney to wound beds daily, cover with dry gauze.  Coat surrounding skin with floor stock moisture barrier.  Cover with silicone foam or ABD pad whichever is preferred.   03/21/23 1117            Discharge Exam: Filed Weights   03/10/23 0947  Weight: 54.4 kg   General exam: awake, alert, no acute distress, frail, chronically ill-appearing HEENT: moist mucus membranes, hearing grossly normal  Respiratory system: on room air, normal respiratory effort. Cardiovascular system: RRR, no pedal edema.   Central nervous system: A&O x3. no gross focal neurologic deficits, normal speech Extremities: no edema, normal tone, decreased range of motion left hip Skin: dry, intact, No rashes seen Psychiatry: normal mood, congruent affect, judgement and insight appear normal     Condition at discharge: poor  The results of significant diagnostics from this hospitalization (including imaging, microbiology, ancillary and laboratory) are listed below for reference.   Imaging Studies: DG Chest Port 1 View  Result Date: 03/15/2023 CLINICAL DATA:  Leukocytosis EXAM: PORTABLE CHEST 1 VIEW COMPARISON:  03/02/2023, CT 01/31/2023 FINDINGS: Right-sided central venous port tip at the SVC. Coarse reticular opacity corresponding to chronic lung disease. Possible small left pleural effusion. Patchy bilateral interstitial opacities suspicious for superimposed infectious or inflammatory process. Stable cardiomediastinal silhouette  with aortic atherosclerosis. IMPRESSION: Chronic lung disease with patchy bilateral interstitial opacities suggesting possible superimposed infectious or inflammatory process. Possible small left effusion. Electronically Signed   By: Jasmine Pang M.D.   On: 03/15/2023 21:13   CT PELVIS WO CONTRAST  Result Date: 03/09/2023 CLINICAL DATA:  Hip trauma EXAM:  CT PELVIS WITHOUT CONTRAST TECHNIQUE: Multidetector CT imaging of the pelvis was performed following the standard protocol without intravenous contrast. RADIATION DOSE REDUCTION: This exam was performed according to the departmental dose-optimization program which includes automated exposure control, adjustment of the mA and/or kV according to patient size and/or use of iterative reconstruction technique. COMPARISON:  Left hip x-ray 03/09/2023. CT of the pelvis 03/02/2023. FINDINGS: Urinary Tract:  No abnormality visualized. Bowel:  Unremarkable visualized pelvic bowel loops. Vascular/Lymphatic: There are atherosclerotic calcifications of the iliac arteries and aorta. No enlarged lymph nodes are seen. Reproductive: There surgical clips in the bilateral inguinal canals. Prostate gland is not well seen. Other: There is no ascites. There is diffuse body wall edema, unchanged. There is no focal hematoma. Musculoskeletal: Bilateral hip arthroplasties are again seen. There is new acute comminuted fracture involving the left greater trochanter which appears nondisplaced. This extends to the femoral prosthesis. There is no dislocation. Subacute right greater trochanteric fracture appears unchanged. IMPRESSION: 1. Acute comminuted nondisplaced fracture of the left greater trochanter extending to the femoral prosthesis. 2. Stable subacute right greater trochanteric fracture. 3. Stable body wall edema. 4. Bilateral hip arthroplasties appear in anatomic alignment. Aortic Atherosclerosis (ICD10-I70.0). Electronically Signed   By: Darliss Cheney M.D.   On: 03/09/2023 17:41    DG Thoracic Spine 2 View  Result Date: 03/09/2023 CLINICAL DATA:  Post fall, now with back pain. History of compression fractures. EXAM: THORACIC SPINE 2 VIEWS COMPARISON:  CT of the chest, abdomen and pelvis-01/31/2023 FINDINGS: Evaluation of the superior aspect of the thoracic spine as well as the cervicothoracic junction is degraded secondary to overlying osseous and soft tissue structures Mild scoliotic curvature of the thoracolumbar spine with dominant mid component convex to the left. Thoracic vertebral body heights are grossly preserved given obliquity. Stigmata of dish throughout the mid and caudal aspects of the thoracic spine. Limited visualization of the adjacent thorax demonstrates port a catheter tip projects over the superior cavoatrial junction. Atherosclerotic plaque within the thoracic aorta. IMPRESSION: 1. No acute findings given scoliotic curvature of the thoracolumbar spine. 2. Stigmata of DISH throughout the mid and caudal aspects of the thoracic spine. Electronically Signed   By: Simonne Come M.D.   On: 03/09/2023 15:12   DG Lumbar Spine Complete  Result Date: 03/09/2023 CLINICAL DATA:  Post fall.  History of compression fracture EXAM: LUMBAR SPINE - COMPLETE 4+ VIEW COMPARISON:  CT abdomen pelvis-01/31/2023 FINDINGS: There are 5 non rib-bearing lumbar type vertebral bodies. Mild scoliotic curvature of the thoracolumbar spine with caudal component convex to the left measuring 18 degrees (as measured from the superior endplate of T10 to the inferior endplate of L3). Straightening of the expected lumbar lordosis. No anterolisthesis or retrolisthesis. Mild (approximate 25%) compression deformities involving the T12, L2 and L3 vertebral bodies, unchanged. Remaining lumbar vertebral body heights appear preserved. Mild-to-moderate multilevel lumbar spine DDD, worse at L1-L2 and L2-L3 with disc space height loss, endplate irregularity and sclerosis. Limited visualization of the bilateral SI  joints is normal. Atherosclerotic plaque within the abdominal aorta. Regional bowel gas pattern and soft tissues appear normal. IMPRESSION: 1. No definite acute findings. 2. Mild (approximate 25%) compression deformities involving the T12, L2 and L3 vertebral bodies, unchanged compared to abdominal CT performed 01/2023. 3. Mild-to-moderate multilevel lumbar spine DDD, worse at L1-L2 and L2-L3. Electronically Signed   By: Simonne Come M.D.   On: 03/09/2023 15:09   DG Hip Unilat With Pelvis 2-3 Views Left  Result Date: 03/09/2023 CLINICAL DATA:  Post fall now with inability to weightbear. EXAM: DG HIP (WITH OR WITHOUT PELVIS) 2-3V LEFT COMPARISON:  Right hip radiographs-02/28/2023; pelvic CT-03/02/2023 FINDINGS: Sequela of previous bilateral total hip replacements. Interval slight displacement known fracture involving the right greater trochanter with extension to abut the femoral stem component of the right total hip prosthesis. Seen only on the provided lateral radiograph is an apparent fracture involving the anterior proximal aspect of the left femur, adjacent to the femoral stem component of the prosthesis, not definitely seen on pelvic CT performed 03/02/2023. IMPRESSION: 1. Seen only on the provided lateral radiograph is a potential minimally displaced fracture involving the anterior aspect of the anterior proximal aspect of the left femur, adjacent to the femoral stem component of the prosthesis, not definitively seen on pelvic CT performed 03/02/2023. 2. Interval slight displacement of known fracture involving the right greater trochanter with extension to abut the femoral stem component of the right total hip prosthesis. Electronically Signed   By: Simonne Come M.D.   On: 03/09/2023 15:06   DG Chest Port 1 View  Result Date: 03/02/2023 CLINICAL DATA:  10031 Cough 10031 Cough, hx of with esophageal adenocarcinoma EXAM: PORTABLE CHEST 1 VIEW COMPARISON:  CT chest 01/31/2023, chest x-ray 02/11/2023  FINDINGS: Right chest wall Port-A-Cath with tip overlying superior cavoatrial junction. Interval increase in prominence of hilar regions. The heart and mediastinal contours are otherwise grossly unchanged. Atherosclerotic plaque. No focal consolidation. Chronic coarsened interstitial markings with no overt pulmonary edema. No pleural effusion. No pneumothorax. No acute osseous abnormality. IMPRESSION: 1. Interval increase in prominence of hilar regions. Underlying lymphadenopathy not excluded. 2. Underlying known interstitial lung disease/pulmonary fibrosis. 3.  Aortic Atherosclerosis (ICD10-I70.0). Electronically Signed   By: Tish Frederickson M.D.   On: 03/02/2023 20:29   CT PELVIS WO CONTRAST  Result Date: 03/02/2023 CLINICAL DATA:  Periprosthetic hip fracture EXAM: CT PELVIS WITHOUT CONTRAST TECHNIQUE: Multidetector CT imaging of the pelvis was performed following the standard protocol without intravenous contrast. RADIATION DOSE REDUCTION: This exam was performed according to the departmental dose-optimization program which includes automated exposure control, adjustment of the mA and/or kV according to patient size and/or use of iterative reconstruction technique. COMPARISON:  Radiograph 02/28/2023, CT 01/31/2023, right hip radiograph 11/18/2015 FINDINGS: Urinary Tract: Partially obscured by artifact from hip hardware. Bladder nondistended Bowel:  No acute bowel wall thickening Vascular/Lymphatic: Moderate aortic atherosclerosis. No aneurysm. No suspicious lymph nodes. Reproductive: Limited by artifact. No obvious mass. Clips in the upper scrotum. Other:  Negative for pelvic effusion or free air. Musculoskeletal: Bilateral hip replacements with normal alignment but extensive artifact. Pubic symphysis and rami appear intact. SI joints are non widened. Acute appearing mildly displaced horizontally oriented fracture involves the greater trochanter of proximal right femur and extends to the proximal femoral rod.  This is best seen on coronal reconstructions. Soft tissue edema and stranding lateral aspect of the right hip consistent with contusion and small hematoma. IMPRESSION: 1. Status post bilateral hip arthroplasties. Acute mildly displaced fracture involving the greater trochanter of proximal right femur with extension of fracture lucency to the proximal femoral rod. No dislocation. Overlying edema within the hip soft tissues and probable small soft tissue hematoma. Aortic Atherosclerosis (ICD10-I70.0). Electronically Signed   By: Jasmine Pang M.D.   On: 03/02/2023 17:29   DG HIP UNILAT W OR W/O PELVIS 2-3 VIEWS RIGHT  Result Date: 03/01/2023 CLINICAL DATA:  Larey Seat on Saturday right hip pain with decreased mobility EXAM: DG HIP (WITH OR WITHOUT PELVIS) 2-3V RIGHT  COMPARISON:  CT 01/31/2023 FINDINGS: SI joints are non widened. Pubic symphysis and rami appear intact. Bilateral hip replacements with normal alignment. Acute periprosthetic fracture involving the inferior aspect of the greater trochanter with about 9 mm fracture gap in the cranial caudal direction. IMPRESSION: Status post bilateral hip replacements. Acute mildly displaced right proximal femoral periprosthetic fracture. Electronically Signed   By: Jasmine Pang M.D.   On: 03/01/2023 16:03   NM Cardiac Muga Rest  Result Date: 03/01/2023 CLINICAL DATA:  Esophageal carcinoma cancer. Evaluate cardiac function in relation to chemotherapy. EXAM: NUCLEAR MEDICINE CARDIAC BLOOD POOL IMAGING (MUGA) TECHNIQUE: Cardiac multi-gated acquisition was performed at rest following intravenous injection of Tc-94m labeled red blood cells. RADIOPHARMACEUTICALS:  21.24 mCi Tc-44m pertechnetate in-vitro labeled red blood cells IV COMPARISON:  None Available. FINDINGS: No  focal wall motion abnormality of the left ventricle. Calculated left ventricular ejection fraction equals 58.8% IMPRESSION: Left ventricular ejection fraction equals58.8 %. Electronically Signed   By:  Genevive Bi M.D.   On: 03/01/2023 08:17    Microbiology: Results for orders placed or performed during the hospital encounter of 01/11/23  SARS Coronavirus 2 by RT PCR (hospital order, performed in [facility] hospital lab) *cepheid single result test* Anterior Nasal Swab     Status: None   Collection Time: 01/12/23  2:09 AM   Specimen: Anterior Nasal Swab  Result Value Ref Range Status   SARS Coronavirus 2 by RT PCR NEGATIVE NEGATIVE Final    Comment: (NOTE) SARS-CoV-2 target nucleic acids are NOT DETECTED.  The SARS-CoV-2 RNA is generally detectable in upper and lower respiratory specimens during the acute phase of infection. The lowest concentration of SARS-CoV-2 viral copies this assay can detect is 250 copies / mL. A negative result does not preclude SARS-CoV-2 infection and should not be used as the sole basis for treatment or other patient management decisions.  A negative result may occur with improper specimen collection / handling, submission of specimen other than nasopharyngeal swab, presence of viral mutation(s) within the areas targeted by this assay, and inadequate number of viral copies (<250 copies / mL). A negative result must be combined with clinical observations, patient history, and epidemiological information.  Fact Sheet for Patients:   RoadLapTop.co.za  Fact Sheet for Healthcare Providers: http://kim-miller.com/  This test is not yet approved or  cleared by the [place] FDA and has been authorized for detection and/or diagnosis of SARS-CoV-2 by FDA under an Emergency Use Authorization (EUA).  This EUA will remain in effect (meaning this test can be used) for the duration of the COVID-19 declaration under Section 564(b)(1) of the Act, 21 U.S.C. section 360bbb-3(b)(1), unless the authorization is terminated or revoked sooner.  Performed at [facility], [address] [place], [place]  [address]   MRSA Next Gen by PCR, Nasal     Status: None   Collection Time: 01/12/23  4:16 AM   Specimen: Nasal Mucosa; Nasal Swab  Result Value Ref Range Status   MRSA by PCR Next Gen NOT DETECTED NOT DETECTED Final    Comment: (NOTE) The GeneXpert MRSA Assay (FDA approved for NASAL specimens only), is one component of a comprehensive MRSA colonization surveillance program. It is not intended to diagnose MRSA infection nor to guide or monitor treatment for MRSA infections. Test performance is not FDA approved in patients less than 75 years old. Performed at [facility], [address] [place], [place] [address]   Culture, blood (Routine X 2) w Reflex to  ID Panel     Status: None   Collection Time: 01/12/23  5:08 AM   Specimen: BLOOD LEFT HAND  Result Value Ref Range Status   Specimen Description BLOOD LEFT HAND  Final   Special Requests   Final    BOTTLES DRAWN AEROBIC ONLY Blood Culture adequate volume   Culture   Final    NO GROWTH 5 DAYS Performed at [facility], [address], [place], [place] [address]    Report Status 01/17/2023 FINAL  Final  Culture, blood (Routine X 2) w Reflex to ID Panel     Status: None   Collection Time: 01/12/23  5:08 AM   Specimen: BLOOD RIGHT HAND  Result Value Ref Range Status   Specimen Description BLOOD RIGHT HAND  Final   Special Requests   Final    BOTTLES DRAWN AEROBIC AND ANAEROBIC Blood Culture adequate volume   Culture   Final    NO GROWTH 5 DAYS Performed at [name], [address] [place], [place] [address]    Report Status 01/17/2023 FINAL  Final    Labs: CBC: Recent Labs  Lab 03/15/23 1028 03/17/23 0620 03/19/23 0539  WBC 15.2* 13.4* 11.2*  HGB 9.1* 9.2* 9.4*  HCT 27.8* 28.3* 28.8*  MCV 107.8* 106.8* 108.7*  PLT 353 321 331   Basic Metabolic Panel: Recent Labs  Lab 03/17/23 0620  NA 134*  K 4.2  CL 93*  CO2 31  GLUCOSE 101*  BUN 12  CREATININE 0.40*  CALCIUM 8.4*    Liver Function Tests: No results for input(s): "AST", "ALT", "ALKPHOS", "BILITOT", "PROT", "ALBUMIN" in the last 168 hours. CBG: No results for input(s): "GLUCAP" in the last 168 hours.  Discharge time spent: greater than 30 minutes.  Signed: Lucile Shutters, MD [facility] 03/21/2023

## 2023-03-21 NOTE — Progress Notes (Signed)
PT Cancellation Note  Patient Details Name: Desmond Mcewan MRN: [id number] DOB: 03-Feb-1946   Cancelled Treatment:    Reason Eval/Treat Not Completed: Other (comment) (Patient declined mobility due to knee pain. He anticipates discharge home later today. PT will continue to follow while in the hospital.)  Donna Bernard, PT, MPT  Ina Homes 03/21/2023, 10:31 AM

## 2023-03-21 NOTE — Plan of Care (Signed)

## 2023-03-28 ENCOUNTER — Other Ambulatory Visit: Payer: Medicare Other

## 2023-03-28 ENCOUNTER — Ambulatory Visit: Payer: Medicare Other | Admitting: Oncology

## 2023-03-28 ENCOUNTER — Ambulatory Visit: Payer: Medicare Other

## 2023-04-30 ENCOUNTER — Telehealth: Payer: Self-pay | Admitting: *Deleted

## 2023-04-30 NOTE — Telephone Encounter (Signed)
Patient's son [name] called at patient request asking that you please call patient back on sons phone. His son is with him at the home. Wife has become ill and is unable to be full time caregiver so the son has stepped in for that role. He reports that patient is not happy with Hospice and is going to cancel their services, he has not had good pain control with their care and wants to come back to [facility] to manage is pain full time He states that he has not had a goodnights rest since home. Please call [phone]

## 2023-04-30 NOTE — Telephone Encounter (Signed)
Had a lengthy conversation with patient and son.  I also spoke with hospice nurse and hospice physician.  Per hospice, they have seen multiple red flags concerning for possible misuse or diversion of opioids including missing medications/lockbox, pocketing pills, conflicting statements from patient and family, and difficulty managing the pain when patient was transferred to the inpatient hospice facility.  For these reasons, I expressed reluctance to resume prescribing at this time.  Hospice is closely monitoring the home situation and pre-filling syringes of morphine.  The hospice nurse plans to see the patient tomorrow to discuss pain management.

## 2023-05-01 ENCOUNTER — Encounter: Payer: Self-pay | Admitting: Oncology

## 2023-05-30 DEATH — deceased
